# Patient Record
Sex: Female | Born: 1935
Health system: Southern US, Community
[De-identification: ages and names within clinical notes are randomized; demographics above are authoritative.]

## PROBLEM LIST (undated history)

## (undated) DIAGNOSIS — L409 Psoriasis, unspecified: Secondary | ICD-10-CM

## (undated) DIAGNOSIS — I639 Cerebral infarction, unspecified: Secondary | ICD-10-CM

## (undated) DIAGNOSIS — M810 Age-related osteoporosis without current pathological fracture: Secondary | ICD-10-CM

## (undated) DIAGNOSIS — R7303 Prediabetes: Secondary | ICD-10-CM

## (undated) DIAGNOSIS — I5030 Unspecified diastolic (congestive) heart failure: Secondary | ICD-10-CM

## (undated) DIAGNOSIS — R490 Dysphonia: Secondary | ICD-10-CM

## (undated) DIAGNOSIS — E785 Hyperlipidemia, unspecified: Secondary | ICD-10-CM

## (undated) DIAGNOSIS — N3281 Overactive bladder: Secondary | ICD-10-CM

## (undated) DIAGNOSIS — F4321 Adjustment disorder with depressed mood: Secondary | ICD-10-CM

## (undated) DIAGNOSIS — G500 Trigeminal neuralgia: Secondary | ICD-10-CM

## (undated) DIAGNOSIS — I10 Essential (primary) hypertension: Secondary | ICD-10-CM

## (undated) DIAGNOSIS — G250 Essential tremor: Secondary | ICD-10-CM

## (undated) DIAGNOSIS — G252 Other specified forms of tremor: Principal | ICD-10-CM

## (undated) DIAGNOSIS — Z7901 Long term (current) use of anticoagulants: Secondary | ICD-10-CM

## (undated) DIAGNOSIS — K5901 Slow transit constipation: Secondary | ICD-10-CM

## (undated) DIAGNOSIS — R1310 Dysphagia, unspecified: Secondary | ICD-10-CM

## (undated) DIAGNOSIS — D62 Acute posthemorrhagic anemia: Secondary | ICD-10-CM

## (undated) HISTORY — PX: HUMERUS FRACTURE SURGERY: SHX670

## (undated) HISTORY — DX: Trigeminal neuralgia: G50.0

## (undated) HISTORY — DX: Overactive bladder: N32.81

## (undated) HISTORY — DX: Psoriasis, unspecified: L40.9

## (undated) HISTORY — DX: Slow transit constipation: K59.01

## (undated) HISTORY — DX: Cerebral infarction, unspecified: I63.9

## (undated) HISTORY — DX: Other specified forms of tremor: G25.2

## (undated) HISTORY — DX: Long term (current) use of anticoagulants: Z79.01

## (undated) HISTORY — DX: Adjustment disorder with depressed mood: F43.21

## (undated) HISTORY — DX: Dysphonia: R49.0

## (undated) HISTORY — DX: Dysphagia, unspecified: R13.10

## (undated) HISTORY — DX: Acute posthemorrhagic anemia: D62

## (undated) HISTORY — DX: Essential (primary) hypertension: I10

## (undated) HISTORY — DX: Hyperlipidemia, unspecified: E78.5

## (undated) HISTORY — PX: CATARACT EXTRACTION: SUR2

## (undated) HISTORY — DX: Age-related osteoporosis without current pathological fracture: M81.0

## (undated) HISTORY — DX: Prediabetes: R73.03

## (undated) HISTORY — PX: OTHER SURGICAL HISTORY: SHX169

## (undated) HISTORY — DX: Unspecified diastolic (congestive) heart failure: I50.30

## (undated) HISTORY — DX: Essential tremor: G25.0

---
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
## 1958-05-25 HISTORY — PX: HEMORRHOID SURGERY: SHX153

## 1997-10-31 ENCOUNTER — Other Ambulatory Visit: Admission: RE | Admit: 1997-10-31 | Discharge: 1997-10-31 | Payer: Self-pay | Admitting: Obstetrics and Gynecology

## 1998-05-15 ENCOUNTER — Other Ambulatory Visit: Admission: RE | Admit: 1998-05-15 | Discharge: 1998-05-15 | Payer: Self-pay | Admitting: Gastroenterology

## 1999-03-03 ENCOUNTER — Other Ambulatory Visit: Admission: RE | Admit: 1999-03-03 | Discharge: 1999-03-03 | Payer: Self-pay | Admitting: Obstetrics and Gynecology

## 1999-03-28 ENCOUNTER — Encounter: Admission: RE | Admit: 1999-03-28 | Discharge: 1999-04-24 | Payer: Self-pay | Admitting: Specialist

## 1999-08-05 ENCOUNTER — Encounter: Admission: RE | Admit: 1999-08-05 | Discharge: 1999-09-18 | Payer: Self-pay | Admitting: Family Medicine

## 2000-01-05 ENCOUNTER — Other Ambulatory Visit: Admission: RE | Admit: 2000-01-05 | Discharge: 2000-01-05 | Payer: Self-pay | Admitting: Obstetrics and Gynecology

## 2000-11-12 ENCOUNTER — Encounter: Payer: Self-pay | Admitting: Family Medicine

## 2000-11-12 ENCOUNTER — Encounter: Admission: RE | Admit: 2000-11-12 | Discharge: 2000-11-12 | Payer: Self-pay | Admitting: Family Medicine

## 2001-02-24 ENCOUNTER — Other Ambulatory Visit: Admission: RE | Admit: 2001-02-24 | Discharge: 2001-02-24 | Payer: Self-pay | Admitting: Obstetrics and Gynecology

## 2003-01-23 ENCOUNTER — Other Ambulatory Visit: Admission: RE | Admit: 2003-01-23 | Discharge: 2003-01-23 | Payer: Self-pay | Admitting: Obstetrics and Gynecology

## 2003-05-26 HISTORY — PX: WRIST FRACTURE SURGERY: SHX121

## 2003-06-04 ENCOUNTER — Ambulatory Visit (HOSPITAL_BASED_OUTPATIENT_CLINIC_OR_DEPARTMENT_OTHER): Admission: RE | Admit: 2003-06-04 | Discharge: 2003-06-04 | Payer: Self-pay | Admitting: *Deleted

## 2004-05-25 HISTORY — PX: LAPAROSCOPIC HYSTERECTOMY: SHX1926

## 2004-08-26 ENCOUNTER — Observation Stay (HOSPITAL_COMMUNITY): Admission: RE | Admit: 2004-08-26 | Discharge: 2004-08-27 | Payer: Self-pay | Admitting: Obstetrics and Gynecology

## 2004-08-26 ENCOUNTER — Encounter (INDEPENDENT_AMBULATORY_CARE_PROVIDER_SITE_OTHER): Payer: Self-pay | Admitting: Specialist

## 2007-07-15 ENCOUNTER — Encounter: Admission: RE | Admit: 2007-07-15 | Discharge: 2007-07-15 | Payer: Self-pay | Admitting: Obstetrics and Gynecology

## 2007-07-27 ENCOUNTER — Ambulatory Visit: Payer: Self-pay | Admitting: Gastroenterology

## 2007-08-22 ENCOUNTER — Ambulatory Visit: Payer: Self-pay | Admitting: Gastroenterology

## 2007-08-22 ENCOUNTER — Encounter: Payer: Self-pay | Admitting: Gastroenterology

## 2008-07-10 ENCOUNTER — Encounter: Admission: RE | Admit: 2008-07-10 | Discharge: 2008-07-10 | Payer: Self-pay | Admitting: Obstetrics and Gynecology

## 2010-10-10 NOTE — Op Note (Signed)
Kelly [name], Kelly [name]              ACCOUNT NO.:  [id number]   MEDICAL RECORD NO.:  [id number]            PATIENT TYPE:   LOCATION:                                 FACILITY:   PHYSICIAN:  Juluis Mire, M.D.        DATE OF BIRTH:   DATE OF PROCEDURE:  08/26/2004  DATE OF DISCHARGE:                                 OPERATIVE REPORT   PREOPERATIVE DIAGNOSIS:  Symptomatic pelvic relaxation with associated  stress incontinence.   OPERATIVE PROCEDURE:  Laparoscopically assisted vaginal hysterectomy with  bilateral salpingo-oophorectomy.  Posterior colporrhaphy using the  PelviSoft.  Sacral spinous ligaments suspension.   SURGEON:  Juluis Mire, M.D.   ASSISTANT:  Freddy Finner, M.D.   ESTIMATED BLOOD LOSS:  Between 2 and 350 mL.   PACKS:  A vaginal pack.   DRAINS:  Urethral Foley.   There was no intraoperative blood replacement.  No complications were  encountered.   PROCEDURE IN DETAIL:  The patient was taken to the operating room and placed  in the supine position.  After satisfactory level of general endotracheal  anesthesia obtained, the patient was placed in the dorsal supine position  using the Allen stirrups.  The abdomen, perineum, vagina were prepped with  Betadine.  The bladder was emptied by catheterization.  The speculum was  placed in the vaginal vault.  The cervix was grasped with single-tooth  tenaculum.  A Hulka tenaculum put in place.  The single-tooth tenaculum was  and then removed.  The patient was then draped as a sterile field.  A  subumbilical incision made with the knife.  The incision was extended to the  fascia.  The fascia was entered sharply and the incision of the fascia  turned laterally.  The rectus muscles were separated, peritoneum was entered  bluntly with the finger.  The __________  open laparoscopic trocar was put  in place and secured.  The abdomen was inflated with carbon dioxide.  The  laparoscope was introduced.  There were some  omental adhesions in the right  lower quadrant probably from a previous appendectomy.  There was no evidence  of injury to adjacent organs.  A 5 mm trocar was put in place in the  suprapubic area.  The uterus was elevated.  The uterus was normal size and  sharp.  The tubes and ovaries were unremarkable.  Using the Gyrus bipolar,  we took down some of the omental adhesions that were right under the  umbilicus.  There was no bowel encountered in this.   Next, using the Gyrus bipolar, we first elevated the left ovary.  The ureter  was easily identified below.  Next, the ovarian vasculature was cauterized  and incised.  The mesenteric attachment to the ovary was then cauterized and  incised.  Then, the left round ligament was cauterized and incised.  We then  went to the right side.  The right ovary was elevated.  The ureter was  easily identified in the pelvic sidewall.  The right ovarian vasculature was  cauterized and incised.  The peritoneal  attachment of the right ovary was  cauterized and incised and the right round ligament was cauterized and  incised.  We had good hemostasis.   At this point in time, the abdomen was desufflated of carbon dioxide.  Laparoscope was removed.  The patient's leg was repositioned.  A weighted  speculum was placed in the vaginal vault.  Cervix was grasped with a Jacob's  tenaculum.  The caudal sac was entered sharply.  The uterosacral ligaments  were clamped, cut, and suture ligated with 0 Vicryl.  The reflection of the  vaginal mucosa anteriorly was incised and __________  was dissected  superiorly.  Paracervical tissue was then clamped, cut, and suture ligated  with 0 Vicryl.   Next, using the clamp, cut, and tie technique, the parametrium was serially  separated from the sides of the uterus.  We then brought a finger under the  uterus and identified the vesicouterine space and it was entered sharply,  retractors put in place.  The uterus was then  flipped, remaining pedicles  were clamped and cut.  Uterus, tubes, and ovaries were passed off the  operative field.  Pedicles were secured with free ties of 0 Vicryl.  We had  good hemostasis.  The vaginal mucosa was reapproximated in vertical fashion  with interrupted figure-of-eights of 0 Vicryl.   At this point, Dr. Annabell Howells came in and did an anterior suspension as well as  suburethral sling.  At this point in time, we did the posterior  colporrhaphy.  A V incision was made over the perineal body using a knife  and the skin was dissected superiorly and excised.  Next, the vaginal mucosa  was undermined.  We continued the dissection up to the vaginal apex.  We  made a midline incision in the vaginal mucosa.  We then dissected down to  both uterosacral ligaments which were easily identified.  The using the  Capio needle passer, we put a total of two Vicryl sutures in each  uterosacral ligament on both sides.  These were as close to the sacrum as  possible.  There was no active bleeding.   Next we brought the PelviSoft in place.  It was secured to two of the  sutures that were through the uterosacral ligament and secured back to the  uterosacral ligament.  We had good placement.  Next, using the Mayo needle,  the remaining free sutures were brought through the vaginal apex mucosa in a  helical fashion.  Both were then secured down giving [place] a good suspension of  the vaginal vault completely back to both uterosacral ligaments.  At this  point in time, we trimmed the remaining PelviSoft and secured it to perineal  body with interrupted sutures of 2-0 Vicryl.  Next, the vaginal mucosa was  closed with a running, locking suture of 2-0 Vicryl.  Interrupted sutures of  2-0 Vicryl were used to rebuild the perineal body and the skin over the  perineum was closed with interrupted stitches of 2-0 Vicryl.  Urine output  remained clear and adequate at this point.  At this point in time, the abdomen  was re-inflated with carbon dioxide, the  laparoscope was re-introduced.  Some bleeding from the left side of the  vaginal cuff was well controlled using the Gyrus.  At this point in time, we  had good hemostasis.  The abdomen was desufflated with carbon dioxide.  The  laparoscope and suprapubic trocar was removed.  Subumbilical fascia was  closed  with interrupted figure-of-eights of 2-0 Vicryl.  The skin was closed  with interrupted subcuticulars of 4-0 Vicryl.  The suprapubic incision was  closed with Dermabond.  Next, a vaginal packing was put in place.  Urine  output remained clear and adequate.  The patient was taken out of the dorsal  lithotomy position, extubated, transferred to the recovery room in good  condition.  Sponge, instrument, needle, count reported as correct by  circulating nurse x2.      JSM/MEDQ  D:  08/26/2004  T:  08/26/2004  Job:  [id number]

## 2010-10-10 NOTE — Discharge Summary (Signed)
Kelly [name], MINOR              ACCOUNT NO.:  [id number]   MEDICAL RECORD NO.:  [id number]          PATIENT TYPE:  OBV   LOCATION:  9315                          FACILITY:  [facility]   PHYSICIAN:  Juluis Mire, M.D.   DATE OF BIRTH:  1927-08-13   DATE OF ADMISSION:  08/26/2004  DATE OF DISCHARGE:                                 DISCHARGE SUMMARY   ADMITTING DIAGNOSIS:  Symptomatic pelvic relaxation with stress urinary  incontinence.   DISCHARGE DIAGNOSIS:  Symptomatic pelvic relaxation with stress urinary  incontinence.   PROCEDURE:  1.  SPARC sling with Perigee anterior suspension.  2.  Laparoscopic-assisted vaginal hysterectomy with bilateral salpingo-      oophorectomy.  3.  Posterior colporrhaphy.  4.  Sacrospinous ligament suspension.   For complete History and Physical please see dictated note.   COURSE IN THE HOSPITAL:  The patient underwent the above-noted surgery.  Pathology is indeed still pending. She did excellent postoperatively. On her  postoperative day #1 she was tolerating a regular diet and ambulating  without difficulty. Foley had been discontinued. She was voiding without  difficulty, had no active vaginal bleeding, and all incisions appeared to be  intact. She will be discharged home at this time. Her postoperative  hemoglobin was 10.8.   COMPLICATIONS:  None encountered during her stay in the hospital. The  patient is returning home in stable condition.   DISPOSITION:  The patient to avoid heavy lifting, vaginal entrance, or  driving a car. She is to call with increasing fever, nausea, vomiting,  increasing abdominal pain, or active vaginal bleeding. Discharged home with  Percocet as she needs for pain. Follow up in the office in 1 week.      JSM/MEDQ  D:  08/27/2004  T:  08/27/2004  Job:  [id number]

## 2010-10-10 NOTE — Op Note (Signed)
NAME:  Kelly [name], Kelly [name]                        ACCOUNT NO.:  [id number]   MEDICAL RECORD NO.:  [id number]                   PATIENT TYPE:  AMB   LOCATION:  DSC                                  FACILITY:  [facility]   PHYSICIAN:  Lowell Bouton, M.D.      DATE OF BIRTH:  1928/01/29   DATE OF PROCEDURE:  06/04/2003  DATE OF DISCHARGE:                                 OPERATIVE REPORT   PREOPERATIVE DIAGNOSIS:  Fracture of left distal radius and ulnar styloid.   POSTOPERATIVE DIAGNOSIS:  Fracture of left distal radius and ulnar styloid.   OPERATION PERFORMED:  Open reduction internal fixation, left distal radius  fracture.   SURGEON:  Lowell Bouton, M.D.   ANESTHESIA:  Axillary block augmented with general.   OPERATIVE FINDINGS:  The patient had a comminuted fracture that was just  proximal to the joint.  It did not have an intra-articular component.  The  fracture was three weeks old and there was a lot of callus formation.  It  was shortened and radially deviated.   DESCRIPTION OF PROCEDURE:  Under axillary block, the left wrist was prepped  and draped in the usual fashion and after exsanguinating the limb, the  tourniquet was inflated to 250 mmHg.  The hand was suspended from the finger  traps in the index and middle fingers over the end of the table with 10  pounds of traction.  Longitudinal incision was made over the volar aspect of  the left distal forearm.  Sharp dissection was carried down to the FCR  tendon.  At this point the patient experienced pain and so she was put to  sleep.  The FCR sheath was opened longitudinally and the tendon was  retracted radially.  The floor of the FCR sheath was then incised and blunt  dissection was carried down to the FPL tendon.  This was retracted ulnarly  and a Therapist, nutritional was used to free it from the radius.  A knife was used  to incise the pronator quadratus and reflect it ulnarly.  The fracture site  was then  identified and had to be taken down and a callus that had formed.  A rongeur was used along with a curet and the periosteum was elevated with a  Therapist, nutritional.  The fracture was then reduced and brought out to length.  The DVR plate was then applied using a left-sided plate.  The 2.5 mm screw  was placed in a slotted hole initially and x-rays were obtained showing the  plate was slightly distal.  The plate was then moved more proximal.  Two  pegs were then inserted distally and x-rays showed good alignment of the  fracture.  The traction was released.  The remaining screws and pegs were  inserted in the plate and x-ray showed good position and alignment.  Under  fluoroscopy there was no motion at the fracture site.  The wounds were then  irrigated copiously  with saline.  Fluoroscopic views showed that the screws  did not penetrate the articular surface.  The wound was closed with 4-0  Vicryl in the pronator quadratus.  TLS drain was inserted for drainage, the  subcutaneous tissue was closed with 4-0 Vicryl and the skin with 3-0  subcuticular Prolene.  Steri-Strips were applied followed by sterile  dressings and a volar wrist splint.  The patient tolerated the procedure  well and went to the recovery room awake and stable in  good condition.                                               Lowell Bouton, M.D.    EMM/MEDQ  D:  06/04/2003  T:  06/04/2003  Job:  [id number]

## 2010-10-10 NOTE — Op Note (Signed)
Kelly [name], Kelly [name]              ACCOUNT NO.:  [id number]   MEDICAL RECORD NO.:  [id number]          PATIENT TYPE:  OBV   LOCATION:  9399                          FACILITY:  [facility]   PHYSICIAN:  Excell Seltzer. Annabell Howells, M.D.    DATE OF BIRTH:  April 28, 1928   DATE OF PROCEDURE:  08/26/2004  DATE OF DISCHARGE:                                 OPERATIVE REPORT   PROCEDURE:  SPARC sling and placement of Perigee anterior suspension kit.   SURGEON:  Excell Seltzer. Annabell Howells, M.D.   ASSISTANT:  Juluis Mire, M.D.   ANESTHESIA:  General anesthesia.   DRAINS:  Foley catheter.   COMPLICATIONS:  None.   INDICATIONS FOR PROCEDURE:  Kelly [name] is a 77 year old white female with  vault prolapse and incontinence who is to undergo a laparoscopically  assisted vaginal hysterectomy  and anterior vault suspension SPARC sling and  posterior repair.   FINDINGS:  The patient was taken to the operating room where general  anesthetic was induced.  Antibiotics were given per Dr. Arelia Sneddon.  He  performed an LAVH and then closed the vaginal cuff.  I then entered the  procedure for my portion.   The Foley catheter was inserted.  The bladder was drained.  The anterior  vaginal wall was infiltrated with about 5 mL of 1% lidocaine with  epinephrine.  Anterior midline vaginal wall incision was made over the  predominant bulge of the cystocele well away from the bladder  neck.  The  vaginal mucosa was then elevated off the underlying bladder wall proximally  to the cuff, laterally to the side walls and distally to approximately the  bladder neck  level.  Once the dissection had been completed, sponge was  temporarily packed in the vaginal vault.  Incisions were then made 5 cm  lateral to the clitoris on each side just below the adductor longus tendon.  Additional incisions were made 3 cm posterior and 2 cm lateral to the  initial incisions.  The left distal needle was then passed through the  obturator foramen without difficulty  until the tip could be palpated within  the vaginal vault at the level of the bladder neck.  This was then repeated  on the right side.  The wings of the Perigee mesh were then snapped to the  appropriate trocars and pulled back through partially.  The mesh was then  flipped onto the abdominal wall and the posterior needles were then passed.  First on the right, then on the left, with a finger in the vaginal incision  adjacent to the ischial spine protecting the bladder.  The needle tip was  brought through just inferior and caudal to the ischial spine.  This  procedure was then repeated on the right side.  Once both of the posterior  trocars had been passed, the posterior wings of the Perigee system were  secured to the trocars and pulled back into position.  The mesh was then  secured at the bladder neck level with two 2-0 Vicryl stitches.  The  proximal end was trimmed appropriately and secured at the  level of the cuff  with two 2-0 Vicryl sutures.  The wings were then pulled into their  appropriate position suspending the bladder.  At this point, the anterior  vaginal wall mucosa was closed with a running locked 2-0 Vicryl.  The wings  were tensioned and the sheaths were removed from the mesh wings.  The long  ends of the wings were then trimmed allowing the ends to drop back into the  subcutaneous space.  At this point, the second anterior vaginal wall  incision was made over the mid urethral level approximately 2 cm in length.  The mucosa was elevated off the pubourethral fascia on each side for  approximately 1 to 2 cm allowing placement of fingertip in the incision.  Two incisions were then made at the level of the superior border of the  pubis each approximately 2 cm lateral to midline.  The trocars for the [facility]  kit were then passed.  First the right trocar was passed, the tip was  brought down to the top of the pubis then walked along the back of the bone,  then brought out  through the vaginal incision under fingertip guide and with  great care being taken to avoid the urethra.  This was then repeated on the  left.  At this point, the Foley was removed.  The cystoscope was placed and  examination of the bladder and bladder neck with the 70 degree lens revealed  no needle passage through the bladder and no obvious injury associated with  the Perigee mesh.  The cystoscope was removed after draining the bladder.  The [facility] mesh was secured to the trocars and pulled into position.  Repeat  cystoscopy once again revealed no injury.  At this point the Foley catheter  was reinserted into the bladder.  The balloon was filled with 10 mL of  sterile fluid and the bladder was drained.  The mesh was then appropriately  tensioned.  The sleeves were removed after irrigating with antibiotic  solution which was done for the Perigee wings as well. Once in proper  position with the ability to place a right angle clamp between the mesh and  the urethra indicating appropriate tension, the anterior vaginal wall  incision was closed using a running 2-0 Vicryl suture.  At this point, the  long ends of the mesh were trimmed on the abdominal wall allowing the trips  to drop back into the subcutaneous space.  All of the small incisions were  then cleaned, dried and closed with Dermabond.  At this point, Dr. Arelia Sneddon  returned and will perform a posterior repair and sacrospinous ligament vault  suspension.  There were no complications during my portion of the procedure.      JJW/MEDQ  D:  08/26/2004  T:  08/26/2004  Job:  [id number]

## 2010-10-10 NOTE — H&P (Signed)
Kelly [name], PAREJA NO.:  [id number]   MEDICAL RECORD NO.:  [id number]          PATIENT TYPE:  AMB   LOCATION:  SDC                           FACILITY:  [facility]   PHYSICIAN:  Juluis Mire, M.D.   DATE OF BIRTH:  [birth year]   DATE OF ADMISSION:  DATE OF DISCHARGE:                                HISTORY & PHYSICAL   The patient is a 77 year old postmenopausal female who presents for surgical  management of symptomatic pelvic relaxation.   The patient has had a history of pelvic relaxation in the form of moderate  cystocele and associated mild rectocele.  She also has moderate uterine  descensus.  This is becoming disruptive in terms of activity due to pressure  and discomfort.  She has also had increasing stress incontinence as well as  bowel difficulties.  She has been seen by Dr. Annabell Howells for this.  Dr. Annabell Howells is  planning to proceed with surgical management of the incontinence.  We have  evaluated her with an ultrasound on February 27 of this year.  She had a  small fibroid.  Adnexa was unremarkable.  In view of increasing  symptomatology, the patient now will proceed with laparoscopically-assisted  vaginal hysterectomy, possible bilateral salpingo-oophorectomy, anterior and  posterior colporrhaphy, and Dr. Annabell Howells will perform some type of urethral  sling.   In terms of allergies, no known drug allergies.   Medications include Fosamax.   PAST MEDICAL HISTORY:  Usual childhood diseases without any significant  sequelae.   PAST SURGICAL HISTORY:  She has had cataract surgery.  She has had her  appendectomy and a hemorrhoidectomy.   PAST OBSTETRICAL HISTORY:  She has had three vaginal deliveries.   FAMILY HISTORY:  Noncontributory.   SOCIAL HISTORY:  There is no tobacco or alcohol use.   REVIEW OF SYSTEMS:  Noncontributory.   PHYSICAL EXAMINATION:  GENERAL:  The patient is afebrile with stable vital  signs.  HEENT:  The patient is normocephalic.  Pupils  equal, round, and reactive to  light and accommodation.  Extraocular movements were intact.  Sclerae and  conjunctivae clear.  Oropharynx clear.  NECK:  Without thyromegaly.  BREASTS:  No discrete masses.  CHEST:  Lungs clear.  CARDIAC:  Regular rhythm and rate without murmurs or gallops.  ABDOMEN:  Benign.  No masses, organomegaly or tenderness.  PELVIC:  Normal external genitalia.  Vaginal mucosa is clear.  Cervix is  unremarkable.  There is a severe cystocele with moderate uterine descensus  and moderate rectocele.  It is hard to determine the amount of cuff support  at the present time.  Bimanual exam:  Uterus normal size and shape, adnexa  unremarkable.  Rectovaginal exam is clear.   IMPRESSION:  Worsening pelvic relaxation with associated symptomatology.   PLAN:  The patient will undergo a laparoscopically-assisted vaginal  hysterectomy and possible bilateral salpingo-oophorectomy.  Will proceed  with A&P repair.  Possible vaginal vault suspension.  Dr. Annabell Howells will be  doing the urological part of the procedure.  The risks of surgery have been  discussed, including the risk of infection; the  risk of hemorrhage that  could require transfusion and the risk of AIDS or hepatitis; the risk of  injury to adjacent organs requiring further exploratory surgery; the risk of  deep venous thrombosis and pulmonary embolus.  The recurrent risk for pelvic  relaxation has also been discussed.  The patient expressed an understanding  of the indications and risks.      JSM/MEDQ  D:  08/26/2004  T:  08/26/2004  Job:  [id number]

## 2011-10-22 DIAGNOSIS — M810 Age-related osteoporosis without current pathological fracture: Secondary | ICD-10-CM | POA: Diagnosis not present

## 2011-10-22 DIAGNOSIS — Z79899 Other long term (current) drug therapy: Secondary | ICD-10-CM | POA: Diagnosis not present

## 2011-10-22 DIAGNOSIS — R82998 Other abnormal findings in urine: Secondary | ICD-10-CM | POA: Diagnosis not present

## 2011-10-29 DIAGNOSIS — I4460 Unspecified fascicular block: Secondary | ICD-10-CM | POA: Diagnosis not present

## 2011-10-29 DIAGNOSIS — G252 Other specified forms of tremor: Secondary | ICD-10-CM | POA: Diagnosis not present

## 2011-10-29 DIAGNOSIS — Z79899 Other long term (current) drug therapy: Secondary | ICD-10-CM | POA: Diagnosis not present

## 2011-10-29 DIAGNOSIS — G250 Essential tremor: Secondary | ICD-10-CM | POA: Diagnosis not present

## 2011-10-29 DIAGNOSIS — Z0000 Encounter for general adult medical examination without abnormal findings: Secondary | ICD-10-CM | POA: Diagnosis not present

## 2011-10-29 DIAGNOSIS — E559 Vitamin D deficiency, unspecified: Secondary | ICD-10-CM | POA: Diagnosis not present

## 2011-11-09 ENCOUNTER — Other Ambulatory Visit: Payer: Self-pay | Admitting: Obstetrics and Gynecology

## 2011-11-09 ENCOUNTER — Other Ambulatory Visit: Payer: Self-pay | Admitting: Internal Medicine

## 2011-11-09 DIAGNOSIS — Z1231 Encounter for screening mammogram for malignant neoplasm of breast: Secondary | ICD-10-CM

## 2011-11-23 ENCOUNTER — Ambulatory Visit
Admission: RE | Admit: 2011-11-23 | Discharge: 2011-11-23 | Disposition: A | Discharge status: Home / Self Care | Payer: Medicare Other | Source: Ambulatory Visit | Attending: Internal Medicine | Admitting: Internal Medicine

## 2011-11-23 DIAGNOSIS — Z1231 Encounter for screening mammogram for malignant neoplasm of breast: Secondary | ICD-10-CM

## 2011-12-01 DIAGNOSIS — M810 Age-related osteoporosis without current pathological fracture: Secondary | ICD-10-CM | POA: Diagnosis not present

## 2012-02-23 DIAGNOSIS — H26499 Other secondary cataract, unspecified eye: Secondary | ICD-10-CM | POA: Diagnosis not present

## 2012-03-22 DIAGNOSIS — Z23 Encounter for immunization: Secondary | ICD-10-CM | POA: Diagnosis not present

## 2012-08-02 DIAGNOSIS — H01009 Unspecified blepharitis unspecified eye, unspecified eyelid: Secondary | ICD-10-CM | POA: Diagnosis not present

## 2012-08-02 DIAGNOSIS — H04129 Dry eye syndrome of unspecified lacrimal gland: Secondary | ICD-10-CM | POA: Diagnosis not present

## 2012-08-02 DIAGNOSIS — H3530 Unspecified macular degeneration: Secondary | ICD-10-CM | POA: Diagnosis not present

## 2012-08-02 DIAGNOSIS — H26499 Other secondary cataract, unspecified eye: Secondary | ICD-10-CM | POA: Diagnosis not present

## 2012-09-06 DIAGNOSIS — H04129 Dry eye syndrome of unspecified lacrimal gland: Secondary | ICD-10-CM | POA: Diagnosis not present

## 2012-09-06 DIAGNOSIS — H3530 Unspecified macular degeneration: Secondary | ICD-10-CM | POA: Diagnosis not present

## 2012-10-31 DIAGNOSIS — M810 Age-related osteoporosis without current pathological fracture: Secondary | ICD-10-CM | POA: Diagnosis not present

## 2012-10-31 DIAGNOSIS — Z79899 Other long term (current) drug therapy: Secondary | ICD-10-CM | POA: Diagnosis not present

## 2012-11-07 DIAGNOSIS — K219 Gastro-esophageal reflux disease without esophagitis: Secondary | ICD-10-CM | POA: Diagnosis not present

## 2012-11-07 DIAGNOSIS — E559 Vitamin D deficiency, unspecified: Secondary | ICD-10-CM | POA: Diagnosis not present

## 2012-11-07 DIAGNOSIS — M810 Age-related osteoporosis without current pathological fracture: Secondary | ICD-10-CM | POA: Diagnosis not present

## 2012-11-07 DIAGNOSIS — G250 Essential tremor: Secondary | ICD-10-CM | POA: Diagnosis not present

## 2012-11-07 DIAGNOSIS — L408 Other psoriasis: Secondary | ICD-10-CM | POA: Diagnosis not present

## 2012-11-07 DIAGNOSIS — IMO0002 Reserved for concepts with insufficient information to code with codable children: Secondary | ICD-10-CM | POA: Diagnosis not present

## 2012-11-07 DIAGNOSIS — I4460 Unspecified fascicular block: Secondary | ICD-10-CM | POA: Diagnosis not present

## 2012-11-07 DIAGNOSIS — Z0000 Encounter for general adult medical examination without abnormal findings: Secondary | ICD-10-CM | POA: Diagnosis not present

## 2013-01-11 ENCOUNTER — Other Ambulatory Visit: Payer: Self-pay

## 2013-01-11 DIAGNOSIS — Z9882 Breast implant status: Secondary | ICD-10-CM

## 2013-01-11 DIAGNOSIS — Z1231 Encounter for screening mammogram for malignant neoplasm of breast: Secondary | ICD-10-CM

## 2013-01-19 DIAGNOSIS — G500 Trigeminal neuralgia: Secondary | ICD-10-CM | POA: Diagnosis not present

## 2013-01-19 DIAGNOSIS — K219 Gastro-esophageal reflux disease without esophagitis: Secondary | ICD-10-CM | POA: Diagnosis not present

## 2013-01-19 DIAGNOSIS — G250 Essential tremor: Secondary | ICD-10-CM | POA: Diagnosis not present

## 2013-02-06 ENCOUNTER — Ambulatory Visit
Admission: RE | Admit: 2013-02-06 | Discharge: 2013-02-06 | Disposition: A | Discharge status: Home / Self Care | Payer: Medicare Other | Source: Ambulatory Visit

## 2013-02-06 DIAGNOSIS — Z9882 Breast implant status: Secondary | ICD-10-CM

## 2013-02-06 DIAGNOSIS — Z1231 Encounter for screening mammogram for malignant neoplasm of breast: Secondary | ICD-10-CM | POA: Diagnosis not present

## 2013-02-16 DIAGNOSIS — Z23 Encounter for immunization: Secondary | ICD-10-CM | POA: Diagnosis not present

## 2013-02-17 ENCOUNTER — Encounter: Payer: Self-pay | Admitting: Neurology

## 2013-02-20 ENCOUNTER — Encounter: Payer: Self-pay | Admitting: Neurology

## 2013-02-20 ENCOUNTER — Ambulatory Visit (INDEPENDENT_AMBULATORY_CARE_PROVIDER_SITE_OTHER): Payer: Medicare Other | Admitting: Neurology

## 2013-02-20 VITALS — BP 130/79 | HR 70 | Ht 60.0 in | Wt 125.0 lb

## 2013-02-20 DIAGNOSIS — G500 Trigeminal neuralgia: Secondary | ICD-10-CM | POA: Diagnosis not present

## 2013-02-20 DIAGNOSIS — R490 Dysphonia: Secondary | ICD-10-CM | POA: Diagnosis not present

## 2013-02-20 DIAGNOSIS — G250 Essential tremor: Secondary | ICD-10-CM | POA: Diagnosis not present

## 2013-02-20 DIAGNOSIS — R6889 Other general symptoms and signs: Secondary | ICD-10-CM

## 2013-02-20 DIAGNOSIS — G252 Other specified forms of tremor: Secondary | ICD-10-CM

## 2013-02-20 HISTORY — DX: Trigeminal neuralgia: G50.0

## 2013-02-20 HISTORY — DX: Other specified forms of tremor: G25.0

## 2013-02-20 HISTORY — DX: Essential tremor: G25.2

## 2013-02-20 HISTORY — DX: Dysphonia: R49.0

## 2013-02-20 MED ORDER — ALPRAZOLAM 0.25 MG PO TABS: 0.2500 mg | ORAL_TABLET | Freq: Three times a day (TID) | ORAL | Status: DC | PRN

## 2013-02-20 NOTE — Progress Notes (Signed)
Reason for visit: Tremor  Kelly [name] is a 79 y.o. female  History of present illness:  Kelly [name] is an 79 year old right-handed white female with a history of an essential tremor, seen previously through this office in 2010. The patient was placed on Topamax for her tremor. The patient recently went off of this medication as she felt that this was not helping. The patient had noted onset of trigeminal neuralgia involving the left V2 distribution. The patient was then placed on low-dose gabapentin taking 100 mg twice daily, with good resolution of the left trigeminal neuralgia pain. The patient indicates that 2 or 3 years ago, she began noting some problems with hoarseness of voice. The patient was seen by Dr. Ezzard Standing, and it was felt that she had reflux issues causing her hoarseness. The patient indicates that no vocal cord nodules or paralysis of the vocal cords were noted. The patient was placed on Nexium, but once again, she gained no benefit with the hoarseness. The patient denies any definite vocal tremors, but the hoarseness has gradually worsened over time. The patient denies any dysphagia. The patient reports no focal numbness or weakness of the face, arms, or legs. The patient denies any visual disturbance, or problems controlling the bowels or the bladder. The patient indicates that she has difficulty with handwriting and feeding himself secondary to the tremor, but she is able to perform all activities of daily living. The patient indicates that her son also has developed an essential tremor.  Past Medical History  Diagnosis Date  . Osteoporosis   . Essential tremor   . Psoriasis   . Essential and other specified forms of tremor 02/20/2013  . Dysphonia 02/20/2013    Past Surgical History  Procedure Laterality Date  . Wrist fracture surgery  2005    seconday shoulder 2001  . Humerus fracture surgery    . Torn rotator cuff    . Hemorrhoid surgery  1960  . Appendectomy  1940  .  Laparoscopic hysterectomy  2006  . Bladder tack      Family History  Problem Relation Age of Onset  . Arthritis Brother     spine  . Osteoporosis Maternal Grandmother     Social history:  reports that she has never smoked. She has never used smokeless tobacco. She reports that  drinks alcohol. She reports that she does not use illicit drugs.  Medications:  Current Outpatient Prescriptions on File Prior to Visit  Medication Sig Dispense Refill  . gabapentin (NEURONTIN) 100 MG capsule Take 100 mg by mouth 2 (two) times daily.       No current facility-administered medications on file prior to visit.     No Known Allergies  ROS:  Out of a complete 14 system review of symptoms, the patient complains only of the following symptoms, and all other reviewed systems are negative.  Hearing loss Easy bruising Slurred speech Tremor Decreased energy Restless legs Hoarseness  Blood pressure 130/79, pulse 70, height 5' (1.524 m), weight 125 lb (56.7 kg).  Physical Exam  General: The patient is alert and cooperative at the time of the examination.  Head: Pupils are equal, round, and reactive to light. Discs are flat bilaterally.  Neck: The neck is supple, no carotid bruits are noted.  Respiratory: The respiratory examination is clear.  Cardiovascular: The cardiovascular examination reveals a regular rate and rhythm, no obvious murmurs or rubs are noted.  Skin: Extremities are without significant edema.  Neurologic Exam  Mental  status:  Cranial nerves: Facial symmetry is present. There is good sensation of the face to pinprick and soft touch bilaterally. The strength of the facial muscles and the muscles to head turning and shoulder shrug are normal bilaterally. Speech is slightly hoarse, no aphasia is noted. Extraocular movements are full. Visual fields are full.  Motor: The motor testing reveals 5 over 5 strength of all 4 extremities. Good symmetric motor tone is noted  throughout.  Sensory: Sensory testing is intact to pinprick, soft touch, vibration sensation, and position sense on all 4 extremities. No evidence of extinction is noted.  Coordination: Cerebellar testing reveals good finger-nose-finger and heel-to-shin bilaterally. Intention tremors are noted with finger-nose-finger bilaterally.  Gait and station: Gait is normal. The patient has good arm swing with walking. Tandem gait is unsteady. Romberg is negative. No drift is seen.  Reflexes: Deep tendon reflexes are symmetric, but are depressed bilaterally. Toes are downgoing bilaterally.   Assessment/Plan:  1. Dysphonia  2. Benign essential tremor   3. Left trigeminal neuralgia  The patient believes that the dysphonia has gradually worsened over time. Another consultation with Dr. Ezzard Standing may be reasonable at this point, as most causes of dysphonia are related to abnormalities of the vocal cords. The patient does not appear to have a true vocal tremor that could be associated with the benign essential tremor. The patient has had good resolution of the left trigeminal neuralgia with a low dose of gabapentin. Gabapentin may also improve tremor, but the patient does not wish to go up on the medication. The patient will be given a low dose of alprazolam to take if needed for the tremor. The patient will undergo a CT of the brain, and some further blood work. The patient will followup if needed.  Marlan Palau MD 02/20/2013 10:14 AM  [facility] [address] [place], [place] [address]  Phone [phone] Fax [phone]

## 2013-02-25 LAB — ANA W/REFLEX: Anti Nuclear Antibody(ANA): NEGATIVE

## 2013-02-25 LAB — ACETYLCHOLINE RECEPTOR, BINDING: AChR Binding Ab, Serum: <0.03 nmol/L (ref 0.00–0.24)

## 2013-02-25 LAB — SEDIMENTATION RATE: Sed Rate: 4 mm/h (ref 0–40)

## 2013-02-25 LAB — RHEUMATOID FACTOR: Rhuematoid fact SerPl-aCnc: 10.8 IU/mL (ref 0.0–13.9)

## 2013-02-25 LAB — TSH: TSH: 0.704 u[IU]/mL (ref 0.450–4.500)

## 2013-02-27 NOTE — Progress Notes (Signed)
Quick Note:  I called pt and relayed the lab results to her with verbal understanding. She stated that she had a 15-20 min episode of dizziness on getting out of bed, lying in bed ok, just getting up. No n/v. She stated this episode worse then other transient times since starting the gabapentin. Dr. Wylene Simmer ordered the gabapentin for tremors. Are you following this? Please advise. ______

## 2013-03-06 NOTE — Progress Notes (Signed)
Quick Note:  I called and LMVM for pt that Dr. Wylene Simmer prescribed the gabapentin which is a low dose. If still having problems and think it is gabapentin to call His office. Dr. Anne Hahn ordered CT and other labs and when has done will callll with results or if questions to call us back. ______

## 2013-03-07 ENCOUNTER — Ambulatory Visit
Admission: RE | Admit: 2013-03-07 | Discharge: 2013-03-07 | Disposition: A | Discharge status: Home / Self Care | Payer: Medicare Other | Source: Ambulatory Visit | Attending: Neurology | Admitting: Neurology

## 2013-03-07 DIAGNOSIS — G250 Essential tremor: Secondary | ICD-10-CM

## 2013-03-07 DIAGNOSIS — R6889 Other general symptoms and signs: Secondary | ICD-10-CM

## 2013-03-07 DIAGNOSIS — G500 Trigeminal neuralgia: Secondary | ICD-10-CM

## 2013-03-07 DIAGNOSIS — R490 Dysphonia: Secondary | ICD-10-CM

## 2013-03-07 DIAGNOSIS — R279 Unspecified lack of coordination: Secondary | ICD-10-CM

## 2013-03-10 ENCOUNTER — Telehealth: Payer: Self-pay | Admitting: Neurology

## 2013-03-10 ENCOUNTER — Telehealth: Payer: Self-pay | Admitting: *Deleted

## 2013-03-10 NOTE — Telephone Encounter (Signed)
Ct shows mild age related shrinkage of brain and hardening of arteries. No worrisome findings.

## 2013-03-10 NOTE — Telephone Encounter (Signed)
Pt called and is asking about CT results.   I called and spoke to [place] at [facility] and it was done 03-07-13.  Will check with Dr. Pearlean Brownie about report this afternoon.

## 2013-03-10 NOTE — Telephone Encounter (Signed)
I called Kelly [name] and relayed this information below concerning her MRI.  Kelly [name] was also told that Dr. Anne Hahn may call himself, but I relayed after speaking with Dr. Pearlean Brownie verbally. She verbalized understanding.

## 2013-03-10 NOTE — Telephone Encounter (Signed)
I called patient. The CT the head was unremarkable.

## 2013-04-14 DIAGNOSIS — M25559 Pain in unspecified hip: Secondary | ICD-10-CM | POA: Diagnosis not present

## 2013-04-14 DIAGNOSIS — M545 Low back pain, unspecified: Secondary | ICD-10-CM | POA: Diagnosis not present

## 2013-11-07 DIAGNOSIS — Z79899 Other long term (current) drug therapy: Secondary | ICD-10-CM | POA: Diagnosis not present

## 2013-11-07 DIAGNOSIS — Z0000 Encounter for general adult medical examination without abnormal findings: Secondary | ICD-10-CM | POA: Diagnosis not present

## 2013-11-07 DIAGNOSIS — R82998 Other abnormal findings in urine: Secondary | ICD-10-CM | POA: Diagnosis not present

## 2013-11-07 DIAGNOSIS — M810 Age-related osteoporosis without current pathological fracture: Secondary | ICD-10-CM | POA: Diagnosis not present

## 2013-11-21 DIAGNOSIS — I4460 Unspecified fascicular block: Secondary | ICD-10-CM | POA: Diagnosis not present

## 2013-11-21 DIAGNOSIS — K219 Gastro-esophageal reflux disease without esophagitis: Secondary | ICD-10-CM | POA: Diagnosis not present

## 2013-11-21 DIAGNOSIS — G500 Trigeminal neuralgia: Secondary | ICD-10-CM | POA: Diagnosis not present

## 2013-11-21 DIAGNOSIS — E559 Vitamin D deficiency, unspecified: Secondary | ICD-10-CM | POA: Diagnosis not present

## 2013-11-21 DIAGNOSIS — L408 Other psoriasis: Secondary | ICD-10-CM | POA: Diagnosis not present

## 2013-11-21 DIAGNOSIS — M810 Age-related osteoporosis without current pathological fracture: Secondary | ICD-10-CM | POA: Diagnosis not present

## 2013-11-21 DIAGNOSIS — G250 Essential tremor: Secondary | ICD-10-CM | POA: Diagnosis not present

## 2013-11-21 DIAGNOSIS — Z1331 Encounter for screening for depression: Secondary | ICD-10-CM | POA: Diagnosis not present

## 2013-11-21 DIAGNOSIS — Z0000 Encounter for general adult medical examination without abnormal findings: Secondary | ICD-10-CM | POA: Diagnosis not present

## 2013-12-05 DIAGNOSIS — M810 Age-related osteoporosis without current pathological fracture: Secondary | ICD-10-CM | POA: Diagnosis not present

## 2014-02-28 DIAGNOSIS — Z23 Encounter for immunization: Secondary | ICD-10-CM | POA: Diagnosis not present

## 2014-05-03 DIAGNOSIS — Z23 Encounter for immunization: Secondary | ICD-10-CM | POA: Diagnosis not present

## 2014-07-30 DIAGNOSIS — R42 Dizziness and giddiness: Secondary | ICD-10-CM | POA: Diagnosis not present

## 2014-07-30 DIAGNOSIS — G500 Trigeminal neuralgia: Secondary | ICD-10-CM | POA: Diagnosis not present

## 2014-07-30 DIAGNOSIS — L409 Psoriasis, unspecified: Secondary | ICD-10-CM | POA: Diagnosis not present

## 2014-07-30 DIAGNOSIS — Z6824 Body mass index (BMI) 24.0-24.9, adult: Secondary | ICD-10-CM | POA: Diagnosis not present

## 2014-11-27 DIAGNOSIS — Z0000 Encounter for general adult medical examination without abnormal findings: Secondary | ICD-10-CM | POA: Diagnosis not present

## 2014-11-27 DIAGNOSIS — M810 Age-related osteoporosis without current pathological fracture: Secondary | ICD-10-CM | POA: Diagnosis not present

## 2014-12-04 DIAGNOSIS — I4460 Unspecified fascicular block: Secondary | ICD-10-CM | POA: Diagnosis not present

## 2014-12-04 DIAGNOSIS — G500 Trigeminal neuralgia: Secondary | ICD-10-CM | POA: Diagnosis not present

## 2014-12-04 DIAGNOSIS — Z0000 Encounter for general adult medical examination without abnormal findings: Secondary | ICD-10-CM | POA: Diagnosis not present

## 2014-12-04 DIAGNOSIS — K219 Gastro-esophageal reflux disease without esophagitis: Secondary | ICD-10-CM | POA: Diagnosis not present

## 2014-12-04 DIAGNOSIS — L409 Psoriasis, unspecified: Secondary | ICD-10-CM | POA: Diagnosis not present

## 2014-12-04 DIAGNOSIS — E559 Vitamin D deficiency, unspecified: Secondary | ICD-10-CM | POA: Diagnosis not present

## 2014-12-04 DIAGNOSIS — Z6824 Body mass index (BMI) 24.0-24.9, adult: Secondary | ICD-10-CM | POA: Diagnosis not present

## 2014-12-04 DIAGNOSIS — Z1389 Encounter for screening for other disorder: Secondary | ICD-10-CM | POA: Diagnosis not present

## 2014-12-04 DIAGNOSIS — M1611 Unilateral primary osteoarthritis, right hip: Secondary | ICD-10-CM | POA: Diagnosis not present

## 2014-12-04 DIAGNOSIS — M810 Age-related osteoporosis without current pathological fracture: Secondary | ICD-10-CM | POA: Diagnosis not present

## 2014-12-04 DIAGNOSIS — G250 Essential tremor: Secondary | ICD-10-CM | POA: Diagnosis not present

## 2014-12-05 DIAGNOSIS — Z1212 Encounter for screening for malignant neoplasm of rectum: Secondary | ICD-10-CM | POA: Diagnosis not present

## 2015-02-28 DIAGNOSIS — Z23 Encounter for immunization: Secondary | ICD-10-CM | POA: Diagnosis not present

## 2015-04-04 ENCOUNTER — Other Ambulatory Visit: Payer: Self-pay

## 2015-04-04 DIAGNOSIS — N644 Mastodynia: Secondary | ICD-10-CM

## 2015-04-08 DIAGNOSIS — N644 Mastodynia: Secondary | ICD-10-CM | POA: Diagnosis not present

## 2015-04-08 DIAGNOSIS — M1611 Unilateral primary osteoarthritis, right hip: Secondary | ICD-10-CM | POA: Diagnosis not present

## 2015-04-08 DIAGNOSIS — Z6824 Body mass index (BMI) 24.0-24.9, adult: Secondary | ICD-10-CM | POA: Diagnosis not present

## 2015-04-08 DIAGNOSIS — L409 Psoriasis, unspecified: Secondary | ICD-10-CM | POA: Diagnosis not present

## 2015-04-11 ENCOUNTER — Other Ambulatory Visit: Payer: Self-pay | Admitting: Internal Medicine

## 2015-04-11 DIAGNOSIS — L409 Psoriasis, unspecified: Secondary | ICD-10-CM | POA: Diagnosis not present

## 2015-04-11 DIAGNOSIS — M150 Primary generalized (osteo)arthritis: Secondary | ICD-10-CM | POA: Diagnosis not present

## 2015-04-11 DIAGNOSIS — N644 Mastodynia: Secondary | ICD-10-CM

## 2015-04-11 DIAGNOSIS — M4120 Other idiopathic scoliosis, site unspecified: Secondary | ICD-10-CM | POA: Diagnosis not present

## 2015-04-11 DIAGNOSIS — M545 Low back pain: Secondary | ICD-10-CM | POA: Diagnosis not present

## 2015-04-11 DIAGNOSIS — I700 Atherosclerosis of aorta: Secondary | ICD-10-CM | POA: Diagnosis not present

## 2015-04-23 ENCOUNTER — Ambulatory Visit
Admission: RE | Admit: 2015-04-23 | Discharge: 2015-04-23 | Disposition: A | Discharge status: Home / Self Care | Payer: Medicare Other | Source: Ambulatory Visit | Attending: Internal Medicine | Admitting: Internal Medicine

## 2015-04-23 DIAGNOSIS — R928 Other abnormal and inconclusive findings on diagnostic imaging of breast: Secondary | ICD-10-CM | POA: Diagnosis not present

## 2015-04-23 DIAGNOSIS — N644 Mastodynia: Secondary | ICD-10-CM

## 2015-12-10 DIAGNOSIS — E559 Vitamin D deficiency, unspecified: Secondary | ICD-10-CM | POA: Diagnosis not present

## 2015-12-10 DIAGNOSIS — M810 Age-related osteoporosis without current pathological fracture: Secondary | ICD-10-CM | POA: Diagnosis not present

## 2015-12-10 DIAGNOSIS — Z0000 Encounter for general adult medical examination without abnormal findings: Secondary | ICD-10-CM | POA: Diagnosis not present

## 2015-12-17 DIAGNOSIS — I4460 Unspecified fascicular block: Secondary | ICD-10-CM | POA: Diagnosis not present

## 2015-12-17 DIAGNOSIS — K219 Gastro-esophageal reflux disease without esophagitis: Secondary | ICD-10-CM | POA: Diagnosis not present

## 2015-12-17 DIAGNOSIS — E559 Vitamin D deficiency, unspecified: Secondary | ICD-10-CM | POA: Diagnosis not present

## 2015-12-17 DIAGNOSIS — G250 Essential tremor: Secondary | ICD-10-CM | POA: Diagnosis not present

## 2015-12-17 DIAGNOSIS — Z0000 Encounter for general adult medical examination without abnormal findings: Secondary | ICD-10-CM | POA: Diagnosis not present

## 2015-12-17 DIAGNOSIS — M1611 Unilateral primary osteoarthritis, right hip: Secondary | ICD-10-CM | POA: Diagnosis not present

## 2015-12-17 DIAGNOSIS — L409 Psoriasis, unspecified: Secondary | ICD-10-CM | POA: Diagnosis not present

## 2015-12-17 DIAGNOSIS — N3281 Overactive bladder: Secondary | ICD-10-CM | POA: Diagnosis not present

## 2015-12-17 DIAGNOSIS — Z1389 Encounter for screening for other disorder: Secondary | ICD-10-CM | POA: Diagnosis not present

## 2015-12-17 DIAGNOSIS — M810 Age-related osteoporosis without current pathological fracture: Secondary | ICD-10-CM | POA: Diagnosis not present

## 2015-12-17 DIAGNOSIS — Z6824 Body mass index (BMI) 24.0-24.9, adult: Secondary | ICD-10-CM | POA: Diagnosis not present

## 2016-01-22 DIAGNOSIS — M25551 Pain in right hip: Secondary | ICD-10-CM | POA: Diagnosis not present

## 2016-02-01 ENCOUNTER — Encounter (HOSPITAL_COMMUNITY): Payer: Self-pay | Admitting: Emergency Medicine

## 2016-02-01 ENCOUNTER — Inpatient Hospital Stay (HOSPITAL_COMMUNITY)
Admission: EM | Admit: 2016-02-01 | Discharge: 2016-02-03 | DRG: 065 | Disposition: A | Discharge status: Rehabilitation Facility | Payer: Medicare Other | Attending: Family Medicine | Admitting: Family Medicine

## 2016-02-01 ENCOUNTER — Inpatient Hospital Stay (HOSPITAL_COMMUNITY): Payer: Medicare Other

## 2016-02-01 ENCOUNTER — Emergency Department (HOSPITAL_COMMUNITY): Payer: Medicare Other

## 2016-02-01 DIAGNOSIS — I63312 Cerebral infarction due to thrombosis of left middle cerebral artery: Secondary | ICD-10-CM | POA: Diagnosis not present

## 2016-02-01 DIAGNOSIS — I639 Cerebral infarction, unspecified: Secondary | ICD-10-CM | POA: Diagnosis not present

## 2016-02-01 DIAGNOSIS — L409 Psoriasis, unspecified: Secondary | ICD-10-CM | POA: Diagnosis present

## 2016-02-01 DIAGNOSIS — R1310 Dysphagia, unspecified: Secondary | ICD-10-CM | POA: Diagnosis present

## 2016-02-01 DIAGNOSIS — I6350 Cerebral infarction due to unspecified occlusion or stenosis of unspecified cerebral artery: Secondary | ICD-10-CM | POA: Diagnosis not present

## 2016-02-01 DIAGNOSIS — Z7983 Long term (current) use of bisphosphonates: Secondary | ICD-10-CM | POA: Diagnosis not present

## 2016-02-01 DIAGNOSIS — G8191 Hemiplegia, unspecified affecting right dominant side: Secondary | ICD-10-CM | POA: Diagnosis not present

## 2016-02-01 DIAGNOSIS — G250 Essential tremor: Secondary | ICD-10-CM | POA: Diagnosis present

## 2016-02-01 DIAGNOSIS — F4321 Adjustment disorder with depressed mood: Secondary | ICD-10-CM | POA: Diagnosis not present

## 2016-02-01 DIAGNOSIS — E785 Hyperlipidemia, unspecified: Secondary | ICD-10-CM | POA: Diagnosis present

## 2016-02-01 DIAGNOSIS — I69322 Dysarthria following cerebral infarction: Secondary | ICD-10-CM | POA: Diagnosis not present

## 2016-02-01 DIAGNOSIS — I69391 Dysphagia following cerebral infarction: Secondary | ICD-10-CM | POA: Diagnosis not present

## 2016-02-01 DIAGNOSIS — Z90710 Acquired absence of both cervix and uterus: Secondary | ICD-10-CM

## 2016-02-01 DIAGNOSIS — D72829 Elevated white blood cell count, unspecified: Secondary | ICD-10-CM

## 2016-02-01 DIAGNOSIS — R4702 Dysphasia: Secondary | ICD-10-CM | POA: Diagnosis present

## 2016-02-01 DIAGNOSIS — Z0389 Encounter for observation for other suspected diseases and conditions ruled out: Secondary | ICD-10-CM | POA: Diagnosis not present

## 2016-02-01 DIAGNOSIS — I69392 Facial weakness following cerebral infarction: Secondary | ICD-10-CM | POA: Diagnosis not present

## 2016-02-01 DIAGNOSIS — I6522 Occlusion and stenosis of left carotid artery: Secondary | ICD-10-CM | POA: Diagnosis not present

## 2016-02-01 DIAGNOSIS — Z8261 Family history of arthritis: Secondary | ICD-10-CM

## 2016-02-01 DIAGNOSIS — Z23 Encounter for immunization: Secondary | ICD-10-CM | POA: Diagnosis not present

## 2016-02-01 DIAGNOSIS — I5189 Other ill-defined heart diseases: Secondary | ICD-10-CM

## 2016-02-01 DIAGNOSIS — I6789 Other cerebrovascular disease: Secondary | ICD-10-CM | POA: Diagnosis not present

## 2016-02-01 DIAGNOSIS — Z9842 Cataract extraction status, left eye: Secondary | ICD-10-CM

## 2016-02-01 DIAGNOSIS — I6601 Occlusion and stenosis of right middle cerebral artery: Secondary | ICD-10-CM | POA: Diagnosis not present

## 2016-02-01 DIAGNOSIS — R471 Dysarthria and anarthria: Secondary | ICD-10-CM | POA: Diagnosis present

## 2016-02-01 DIAGNOSIS — N3281 Overactive bladder: Secondary | ICD-10-CM | POA: Diagnosis not present

## 2016-02-01 DIAGNOSIS — Z9841 Cataract extraction status, right eye: Secondary | ICD-10-CM

## 2016-02-01 DIAGNOSIS — D62 Acute posthemorrhagic anemia: Secondary | ICD-10-CM | POA: Diagnosis present

## 2016-02-01 DIAGNOSIS — I158 Other secondary hypertension: Secondary | ICD-10-CM

## 2016-02-01 DIAGNOSIS — G8111 Spastic hemiplegia affecting right dominant side: Secondary | ICD-10-CM | POA: Diagnosis not present

## 2016-02-01 DIAGNOSIS — M810 Age-related osteoporosis without current pathological fracture: Secondary | ICD-10-CM | POA: Diagnosis present

## 2016-02-01 DIAGNOSIS — K5900 Constipation, unspecified: Secondary | ICD-10-CM | POA: Diagnosis present

## 2016-02-01 DIAGNOSIS — R29818 Other symptoms and signs involving the nervous system: Secondary | ICD-10-CM | POA: Diagnosis not present

## 2016-02-01 DIAGNOSIS — R29810 Facial weakness: Secondary | ICD-10-CM | POA: Diagnosis present

## 2016-02-01 DIAGNOSIS — Z79899 Other long term (current) drug therapy: Secondary | ICD-10-CM | POA: Diagnosis not present

## 2016-02-01 DIAGNOSIS — Z8262 Family history of osteoporosis: Secondary | ICD-10-CM

## 2016-02-01 DIAGNOSIS — R7303 Prediabetes: Secondary | ICD-10-CM | POA: Diagnosis not present

## 2016-02-01 DIAGNOSIS — I5030 Unspecified diastolic (congestive) heart failure: Secondary | ICD-10-CM | POA: Diagnosis present

## 2016-02-01 DIAGNOSIS — I69351 Hemiplegia and hemiparesis following cerebral infarction affecting right dominant side: Secondary | ICD-10-CM | POA: Diagnosis present

## 2016-02-01 DIAGNOSIS — I63311 Cerebral infarction due to thrombosis of right middle cerebral artery: Secondary | ICD-10-CM | POA: Diagnosis not present

## 2016-02-01 DIAGNOSIS — I10 Essential (primary) hypertension: Secondary | ICD-10-CM | POA: Diagnosis present

## 2016-02-01 DIAGNOSIS — I110 Hypertensive heart disease with heart failure: Secondary | ICD-10-CM | POA: Diagnosis present

## 2016-02-01 DIAGNOSIS — IMO0002 Reserved for concepts with insufficient information to code with codable children: Secondary | ICD-10-CM

## 2016-02-01 HISTORY — DX: Cerebral infarction, unspecified: I63.9

## 2016-02-01 LAB — RAPID URINE DRUG SCREEN, HOSP PERFORMED
Amphetamines: NOT DETECTED
Barbiturates: NOT DETECTED
Benzodiazepines: NOT DETECTED
Cocaine: NOT DETECTED
Opiates: NOT DETECTED
Tetrahydrocannabinol: NOT DETECTED

## 2016-02-01 LAB — CBC
HCT: 39 % (ref 36.0–46.0)
Hemoglobin: 12.4 g/dL (ref 12.0–15.0)
MCH: 28.6 pg (ref 26.0–34.0)
MCHC: 31.8 g/dL (ref 30.0–36.0)
MCV: 90.1 fL (ref 78.0–100.0)
Platelets: 285 K/uL (ref 150–400)
RBC: 4.33 MIL/uL (ref 3.87–5.11)
RDW: 13.2 % (ref 11.5–15.5)
WBC: 11.5 K/uL — ABNORMAL HIGH (ref 4.0–10.5)

## 2016-02-01 LAB — PROTIME-INR
INR: 1.02
Prothrombin Time: 13.4 seconds (ref 11.4–15.2)

## 2016-02-01 LAB — I-STAT CHEM 8, ED
BUN: 24 mg/dL — ABNORMAL HIGH (ref 6–20)
Calcium, Ion: 1.19 mmol/L (ref 1.15–1.40)
Chloride: 106 mmol/L (ref 101–111)
Creatinine, Ser: 0.7 mg/dL (ref 0.44–1.00)
Glucose, Bld: 111 mg/dL — ABNORMAL HIGH (ref 65–99)
HCT: 38 % (ref 36.0–46.0)
Hemoglobin: 12.9 g/dL (ref 12.0–15.0)
Potassium: 4.2 mmol/L (ref 3.5–5.1)
Sodium: 141 mmol/L (ref 135–145)
TCO2: 23 mmol/L (ref 0–100)

## 2016-02-01 LAB — URINALYSIS, ROUTINE W REFLEX MICROSCOPIC
Bilirubin Urine: NEGATIVE
Glucose, UA: NEGATIVE mg/dL
Hgb urine dipstick: NEGATIVE
Ketones, ur: NEGATIVE mg/dL
Leukocytes, UA: NEGATIVE
Nitrite: NEGATIVE
Protein, ur: NEGATIVE mg/dL
Specific Gravity, Urine: 1.016 (ref 1.005–1.030)
pH: 6 (ref 5.0–8.0)

## 2016-02-01 LAB — COMPREHENSIVE METABOLIC PANEL
ALT: 13 U/L — ABNORMAL LOW (ref 14–54)
AST: 19 U/L (ref 15–41)
Albumin: 3.3 g/dL — ABNORMAL LOW (ref 3.5–5.0)
Alkaline Phosphatase: 73 U/L (ref 38–126)
Anion gap: 5 (ref 5–15)
BUN: 22 mg/dL — ABNORMAL HIGH (ref 6–20)
CO2: 24 mmol/L (ref 22–32)
Calcium: 9 mg/dL (ref 8.9–10.3)
Chloride: 109 mmol/L (ref 101–111)
Creatinine, Ser: 0.7 mg/dL (ref 0.44–1.00)
GFR calc Af Amer: >60 mL/min (ref >60)
GFR calc non Af Amer: >60 mL/min (ref >60)
Glucose, Bld: 112 mg/dL — ABNORMAL HIGH (ref 65–99)
Potassium: 4.1 mmol/L (ref 3.5–5.1)
Sodium: 138 mmol/L (ref 135–145)
Total Bilirubin: 0.3 mg/dL (ref 0.3–1.2)
Total Protein: 6.5 g/dL (ref 6.5–8.1)

## 2016-02-01 LAB — DIFFERENTIAL
Basophils Absolute: 0 10*3/uL (ref 0.0–0.1)
Basophils Relative: 0 %
Eosinophils Absolute: 0.2 10*3/uL (ref 0.0–0.7)
Eosinophils Relative: 2 %
Lymphocytes Relative: 15 %
Lymphs Abs: 1.8 10*3/uL (ref 0.7–4.0)
Monocytes Absolute: 1 10*3/uL (ref 0.1–1.0)
Monocytes Relative: 9 %
Neutro Abs: 8.5 10*3/uL — ABNORMAL HIGH (ref 1.7–7.7)
Neutrophils Relative %: 74 %

## 2016-02-01 LAB — I-STAT TROPONIN, ED: Troponin i, poc: 0 ng/mL (ref 0.00–0.08)

## 2016-02-01 LAB — ETHANOL: Alcohol, Ethyl (B): <5 mg/dL (ref <5)

## 2016-02-01 LAB — APTT: aPTT: 30 s (ref 24–36)

## 2016-02-01 MED ORDER — HEPARIN SODIUM (PORCINE) 5000 UNIT/ML IJ SOLN
5000.0000 [IU] | Freq: Three times a day (TID) | INTRAMUSCULAR | Status: DC
  Administered 2016-02-01 – 2016-02-03 (×6): 5000 [IU] via SUBCUTANEOUS
  Filled 2016-02-01 (×6): qty 1

## 2016-02-01 MED ORDER — PANTOPRAZOLE SODIUM 40 MG PO TBEC
40.0000 mg | DELAYED_RELEASE_TABLET | Freq: Every day | ORAL | Status: DC
  Administered 2016-02-02 – 2016-02-03 (×2): 40 mg via ORAL
  Filled 2016-02-01 (×2): qty 1

## 2016-02-01 MED ORDER — STROKE: EARLY STAGES OF RECOVERY BOOK
Freq: Once | Status: DC
  Filled 2016-02-01: qty 1

## 2016-02-01 MED ORDER — ACETAMINOPHEN 325 MG PO TABS: 650.0000 mg | ORAL_TABLET | ORAL | Status: DC | PRN

## 2016-02-01 MED ORDER — TOPIRAMATE 25 MG PO TABS
25.0000 mg | ORAL_TABLET | Freq: Two times a day (BID) | ORAL | Status: DC
  Administered 2016-02-02 – 2016-02-03 (×3): 25 mg via ORAL
  Filled 2016-02-01 (×3): qty 1

## 2016-02-01 MED ORDER — SENNOSIDES-DOCUSATE SODIUM 8.6-50 MG PO TABS: 1.0000 | ORAL_TABLET | Freq: Every evening | ORAL | Status: DC | PRN

## 2016-02-01 MED ORDER — ASPIRIN 325 MG PO TABS
325.0000 mg | ORAL_TABLET | Freq: Every day | ORAL | Status: DC
  Administered 2016-02-02 – 2016-02-03 (×2): 325 mg via ORAL
  Filled 2016-02-01 (×2): qty 1

## 2016-02-01 MED ORDER — SIMVASTATIN 40 MG PO TABS: 40.0000 mg | ORAL_TABLET | Freq: Every day | ORAL | Status: DC

## 2016-02-01 MED ORDER — ASPIRIN 300 MG RE SUPP: 300.0000 mg | Freq: Every day | RECTAL | Status: DC

## 2016-02-01 MED ORDER — OXYBUTYNIN CHLORIDE 5 MG PO TABS
5.0000 mg | ORAL_TABLET | Freq: Every day | ORAL | Status: DC
  Administered 2016-02-02: 5 mg via ORAL
  Filled 2016-02-01: qty 1

## 2016-02-01 MED ORDER — ACETAMINOPHEN 650 MG RE SUPP: 650.0000 mg | RECTAL | Status: DC | PRN

## 2016-02-01 MED ORDER — IOPAMIDOL (ISOVUE-370) INJECTION 76%
INTRAVENOUS | Status: AC
  Administered 2016-02-01: 100 mL
  Filled 2016-02-01: qty 100

## 2016-02-01 NOTE — ED Notes (Signed)
Pt failed swallow screen-- coughing after 2 sips-- is ambulatory to bathroom with assistance, is alert/oriented x 4, w/d, lives with husband at home, is normally independent. Husband with patient.

## 2016-02-01 NOTE — Progress Notes (Signed)
Patient to floor at 1844.

## 2016-02-01 NOTE — ED Notes (Signed)
Took pt to restroom via wheelchair 

## 2016-02-01 NOTE — Progress Notes (Signed)
Patient still in ED. Not admitted to at this point

## 2016-02-01 NOTE — ED Provider Notes (Signed)
[facility]-EMERGENCY DEPT Provider Note   CSN: [id number] Arrival date & time: 02/01/16  1344     History   Chief Complaint Chief Complaint  Patient presents with  . Stroke Symptoms    HPI Kelly [name] is a 82 y.o. female.  Patient is a 82 year old female with past medical history of essential tremor, osteoporosis who presents the ED via EMS with complaint of right-sided facial droop, onset 7:30 AM. Patient reports when she woke up at exam she felt fine but denies looking in a mere. She notes around 7:30 when she began eating breakfast her husband reported that he noticed the patient had drooping of the right side of her mouth. Patient reports she was having difficulty eating food due to her facial droop. Patient denies any other pain or complaints at this time. Of note patient reports she fell last week and landed on her buttocks resulting in her having bilateral hip pain. Patient reports she was evaluated by her orthopedist, Dr. Eulah Pont, last week and had x-rays performed which were unremarkable. Patient reports she has had mild pain with ambulation in her hip since the fall. Denies fever, chills, headache, lightheadedness, dizziness, cough, shortness of breath, chest pain, abdominal pain or nausea, vomiting, diarrhea, urinary symptoms, numbness, tingling, weakness, syncope, seizure. Patient reports she took ibuprofen and aspirin this morning due to having pain in her hips from the fall. Denies use of anticoagulants.      Past Medical History:  Diagnosis Date  . Dysphonia 02/20/2013  . Essential and other specified forms of tremor 02/20/2013  . Essential tremor   . Osteoporosis   . Psoriasis     Patient Active Problem List   Diagnosis Date Noted  . Essential and other specified forms of tremor 02/20/2013  . Dysphonia 02/20/2013  . Trigeminal neuralgia 02/20/2013    Past Surgical History:  Procedure Laterality Date  . APPENDECTOMY  1940  . bladder tack    . CATARACT EXTRACTION  Bilateral   . HEMORRHOID SURGERY  1960  . HUMERUS FRACTURE SURGERY    . LAPAROSCOPIC HYSTERECTOMY  2006  . torn rotator cuff    . WRIST FRACTURE SURGERY  2005   seconday shoulder 2001    OB History    No data available       Home Medications    Prior to Admission medications   Medication Sig Start Date End Date Taking? Authorizing Provider  alendronate (FOSAMAX) 70 MG tablet Take 70 mg by mouth once a week. Take with a full glass of water on an empty stomach.   Yes Historical Provider, MD  esomeprazole (NEXIUM) 40 MG capsule Take 40 mg by mouth daily at 12 noon.   Yes Historical Provider, MD  ibuprofen (ADVIL,MOTRIN) 200 MG tablet Take 400 mg by mouth every 6 (six) hours as needed for moderate pain.    Yes Historical Provider, MD  oxybutynin (DITROPAN) 5 MG tablet Take 5 mg by mouth at bedtime.   Yes Historical Provider, MD  topiramate (TOPAMAX) 25 MG tablet Take 25 mg by mouth 2 (two) times daily.   Yes Historical Provider, MD  ALPRAZolam (XANAX) 0.25 MG tablet Take 1 tablet (0.25 mg total) by mouth 3 (three) times daily as needed (tremor). Patient not taking: Reported on 02/01/2016 02/20/13   York Spaniel, MD    Family History Family History  Problem Relation Age of Onset  . Arthritis Brother     spine  . Osteoporosis Maternal Grandmother     Social  History Social History  Substance Use Topics  . Smoking status: Never Smoker  . Smokeless tobacco: Never Used  . Alcohol use Yes     Comment: Wine 2 glass daily     Allergies   Review of patient's allergies indicates no known allergies.   Review of Systems Review of Systems  Neurological: Positive for facial asymmetry (right sided facial droop).  All other systems reviewed and are negative.    Physical Exam Updated Vital Signs BP 135/76 (BP Location: Right Arm)   Pulse 73   Temp 98.6 F (37 C) (Oral)   Resp 18   Ht 5' (1.524 m)   Wt 52.2 kg   SpO2 97%   BMI 22.46 kg/m   Physical Exam    Constitutional: She is oriented to person, place, and time. She appears well-developed and well-nourished.  HENT:  Head: Normocephalic and atraumatic. Head is without raccoon's eyes, without Battle's sign, without abrasion, without contusion and without laceration.  Right Ear: Tympanic membrane normal. No hemotympanum.  Left Ear: Tympanic membrane normal. No hemotympanum.  Nose: Nose normal.  Mouth/Throat: Uvula is midline, oropharynx is clear and moist and mucous membranes are normal. No oropharyngeal exudate, posterior oropharyngeal edema, posterior oropharyngeal erythema or tonsillar abscesses. No tonsillar exudate.  Eyes: Conjunctivae and EOM are normal. Right eye exhibits no discharge. Left eye exhibits no discharge. No scleral icterus.  Neck: Normal range of motion. Neck supple.  Cardiovascular: Normal rate, regular rhythm, normal heart sounds and intact distal pulses.   Pulmonary/Chest: Effort normal and breath sounds normal. No respiratory distress. She has no wheezes. She has no rales. She exhibits no tenderness.  Abdominal: Soft. Bowel sounds are normal. She exhibits no distension and no mass. There is no tenderness. There is no rebound and no guarding. No hernia.  Musculoskeletal: Normal range of motion. She exhibits no edema or tenderness.  No midline C, T, or L tenderness. Full range of motion of neck and back. Full range of motion of bilateral upper and lower extremities, with 5/5 strength. Sensation intact. 2+ radial and PT pulses. Cap refill <2 seconds.   Neurological: She is alert and oriented to person, place, and time. She has normal strength. She displays tremor (bilateral hand tremor, pt reports is chronic and unchanged). No sensory deficit. She displays a negative Romberg sign. Coordination normal.  CN exam showed right sided facial droop of mouth present with mouth closed and with smiling. Pt able to raise eyebrows. Sensation intact bilaterally with facial nerve. Remaining CNs  intact.   Skin: Skin is warm and dry.  Nursing note and vitals reviewed.    ED Treatments / Results  Labs (all labs ordered are listed, but only abnormal results are displayed) Labs Reviewed  CBC - Abnormal; Notable for the following:       Result Value   WBC 11.5 (*)    All other components within normal limits  DIFFERENTIAL - Abnormal; Notable for the following:    Neutro Abs 8.5 (*)    All other components within normal limits  COMPREHENSIVE METABOLIC PANEL - Abnormal; Notable for the following:    Glucose, Bld 112 (*)    BUN 22 (*)    Albumin 3.3 (*)    ALT 13 (*)    All other components within normal limits  I-STAT CHEM 8, ED - Abnormal; Notable for the following:    BUN 24 (*)    Glucose, Bld 111 (*)    All other components within normal limits  ETHANOL  PROTIME-INR  APTT  URINE RAPID DRUG SCREEN, HOSP PERFORMED  URINALYSIS, ROUTINE W REFLEX MICROSCOPIC (NOT AT [facility])  I-STAT TROPOININ, ED    EKG  EKG Interpretation  Date/Time:  Saturday February 01 2016 13:57:37 EDT Ventricular Rate:  79 PR Interval:    QRS Duration: 94 QT Interval:  419 QTC Calculation: 481 R Axis:   -71 Text Interpretation:  Sinus rhythm Left anterior fascicular block Abnormal R-wave progression, early transition Borderline T wave abnormalities Confirmed by Rubin Payor  MD, Harrold Donath [phone]) on 02/01/2016 4:03:40 PM       Radiology Mr Brain Wo Contrast  Result Date: 02/01/2016 CLINICAL DATA:  Right-sided facial droop. Stroke like symptoms. Slurred speech. Symptoms began at 7:30 a.m. EXAM: MRI HEAD WITHOUT CONTRAST TECHNIQUE: Multiplanar, multiecho pulse sequences of the brain and surrounding structures were obtained without intravenous contrast. COMPARISON:  CT head without contrast 03/07/2013 FINDINGS: Brain: Acute nonhemorrhagic linear infarct is present within the posterior limb of the left internal capsule measuring up to 14 mm. No other acute infarct is present. Extensive dilated  perivascular spaces are present throughout the basal ganglia and thalami. Advanced atrophy and diffuse white matter disease is present bilaterally. The ventricles are proportionate to the degree of atrophy. White matter changes extend into the brainstem. The brainstem and cerebellum are otherwise unremarkable. Internal auditory canals are within normal limits. Vascular: Flow is present in the major intracranial arteries. Skull and upper cervical spine: The skullbase is within normal limits. Midline sagittal images demonstrate an infarct involving the anterior corpus callosum. Midline intracranial structures are otherwise within normal limits. Mild degenerative changes are present in the upper cervical spine, and consistent with a age. Sinuses/Orbits: The paranasal sinuses and mastoid air cells are clear. Bilateral lens replacements are present. The globes and orbits are intact. Other: IMPRESSION: 1. Acute nonhemorrhagic 1.4 cm linear infarct within the posterior limb of the left internal capsule. This corresponds with the patient's right-sided symptoms and a aphasia. 2. Advanced atrophy and diffuse white matter disease likely reflects the sequela of chronic microvascular ischemia without other acute infarct. 3. Dilated perivascular spaces throughout the basal ganglia. These results were called by telephone at the time of interpretation on 02/01/2016 at 3:58 pm to Dr. Rubin Payor, who verbally acknowledged these results. Electronically Signed   By: Marin Roberts M.D.   On: 02/01/2016 16:01    Procedures Procedures (including critical care time)  Medications Ordered in ED Medications - No data to display   Initial Impression / Assessment and Plan / ED Course  I have reviewed the triage vital signs and the nursing notes.  Pertinent labs & imaging results that were available during my care of the patient were reviewed by me and considered in my medical decision making (see chart for details).  Clinical  Course   Pt presents with new onset right sided facial droop. Hx of essential tremor. Reports fall last week and notes she was seen by her orthopedist, hip xrays negative. Pt took aspirin and advil this morning. VSS. Exam revealed drooping of right side of mouth, remaining neuro exam unremarkable. No other cranial nerve deficits.   Pt with neurological deficit with vague onset, started appx 6.5 hours PTA, pt outside TPA window, code stroke was not initiated. Consulted neurology. Dr. Amada Jupiter advised to order MR brain without to evaluate for stroke.   WBC 11.5. Remaining labs and urine unremarkable. EKG showed sinus rhythm without acute ischemic changes. MRI brain pending.   MRI brain showed acute nonhemorrhagic 1.4  cm linear infarct within the posterior limb of the left internal capsule. Neurology notified about pt's MRI results. Discussed results and plan for admission with pt and family. Consulted hospitalist for admission. Marlowe KaysSara Wertman, PA-C agrees to admission. She reports she will come evaluate in the ED and placed admission orders.   Final Clinical Impressions(s) / ED Diagnoses   Final diagnoses:  Cerebral infarction due to unspecified mechanism    New Prescriptions New Prescriptions   No medications on file     Barrett Henleicole Elizabeth Nadeau, PA-C 02/01/16 1629    Benjiman CoreNathan Pickering, MD 02/02/16 [phone]

## 2016-02-01 NOTE — ED Triage Notes (Signed)
To ED via [facility] from home with c/o stroke like symptoms -- LSN unknown-- husband noticed pt had a facial droop and slurred speech at 0730.-- was dribbling food and drink at breakfast. Pt is alert/oriented x 4 on arrival.

## 2016-02-01 NOTE — Progress Notes (Signed)
Patient admitted to 5M01. Patient is alert, oriented x4, denies pain, no skin issues, tele set up. Oriented to room, unit, floor. Will continue to monitor.

## 2016-02-01 NOTE — Consult Note (Signed)
Neurology Consultation Reason for Consult: Facial weakness Referring Physician: Ruel Favors  CC: Right facial weakness  History is obtained from: Patient  HPI: Kelly [name] is a 82 y.o. female who awoke this morning normal at 6 AM. She then began feeling unwell half hour or so later lay back down awakening at 7:30 noticing right facial weakness. She states that she also noticed slurring. She is not sure if she notices much weakness on her right side, but notes that she has chronic problems with both legs, more so since her recent fall.   LKW: 6:30 AM tpa given?: no, out of window    ROS: A 14 point ROS was performed and is negative except as noted in the HPI.   Past Medical History:  Diagnosis Date  . Dysphonia 02/20/2013  . Essential and other specified forms of tremor 02/20/2013  . Essential tremor   . Osteoporosis   . Psoriasis      Family History  Problem Relation Age of Onset  . Arthritis Brother     spine  . Osteoporosis Maternal Grandmother      Social History:  reports that she has never smoked. She has never used smokeless tobacco. She reports that she drinks alcohol. She reports that she does not use drugs.   Exam: Current vital signs: BP 164/80 (BP Location: Right Arm)   Pulse 82   Temp 98.3 F (36.8 C) (Oral)   Resp 14   Ht 5' (1.524 m)   Wt 52.2 kg (115 lb)   SpO2 99%   BMI 22.46 kg/m  Vital signs in last 24 hours: Temp:  [98.3 F (36.8 C)-98.6 F (37 C)] 98.3 F (36.8 C) (09/09 1632) Pulse Rate:  [73-82] 82 (09/09 1714) Resp:  [14-18] 14 (09/09 1714) BP: (135-164)/(68-80) 164/80 (09/09 1714) SpO2:  [97 %-99 %] 99 % (09/09 1714) Weight:  [52.2 kg (115 lb)] 52.2 kg (115 lb) (09/09 1404)   Physical Exam  Constitutional: Appears well-developed and well-nourished.  Psych: Affect appropriate to situation Eyes: No scleral injection HENT: No OP obstrucion Head: Normocephalic.  Cardiovascular: Normal rate and regular rhythm.  Respiratory:  Effort normal and breath sounds normal to anterior ascultation GI: Soft.  No distension. There is no tenderness.  Skin: WDI  Neuro: Mental Status: Patient is awake, alert, oriented to person, place, month, year, and situation. Patient is able to give a clear and coherent history. No signs of aphasia or neglect Cranial Nerves: II: Visual Fields are full. Pupils are equal, round, and reactive to light.   III,IV, VI: EOMI without ptosis or diploplia.  V: Facial sensation is symmetric to temperature VII: Facial movement is decreased on the right VIII: hearing is intact to voice X: Uvula elevates symmetrically XI: Shoulder shrug is symmetric. XII: tongue is midline without atrophy or fasciculations.  Motor: Tone is normal. Bulk is normal. 5/5 strength was present on the left side, she has very mild 4+/5 weakness of the right arm and leg Sensory: Sensation is symmetric to light touch and temperature in the arms and legs. Cerebellar: FNF  intact bilaterally      I have reviewed labs in epic and the results pertinent to this consultation are: Chem 8-unremarkable  I have reviewed the images obtained: MRI brain-small subcortical infarct  Impression: 82 year old female with small subcortical infarct. She will need to be admitted for physical therapy and further evaluation.  Recommendations: 1. HgbA1c, fasting lipid panel 2. CT Angio head and neck 3. Frequent neuro checks  4. Echocardiogram 5. Carotid dopplers are not needed given CTA neck 6. Prophylactic therapy-Antiplatelet med: Aspirin - dose 325mg  PO or 300mg  PR 7. Risk factor modification 8. Telemetry monitoring 9. PT consult, OT consult, Speech consult 10. please page stroke NP  Or  PA  Or MD  M-F from 8am -4 pm starting 9/10 as this patient will be followed by the stroke team at this point.   You can look them up on [email or web address]      [name], MD [facility] [phone]  If 7pm- 7am, please  page neurology on call as listed in AMION.

## 2016-02-01 NOTE — ED Notes (Signed)
Next neuro check at 2000.

## 2016-02-01 NOTE — H&P (Signed)
History and Physical    Kelly [name] ZOX:[id number] DOB: 1927/12/31 DOA: 02/01/2016   PCP: Gaspar Garbe, MD   Patient coming from:  Home   Chief Complaint: Right facial droop  HPI: Kelly [name] is a 82 y.o. female with medical history significant for essential tremor, psoriasis and osteoporosis,, presenting with acute onset f R facial droop since 7:30 without any other focal abnormalities. Patient  never had a similar episode.Denis any history of TIA. Denies vertigo dizziness or vision changes. Denies headaches. No dysphagia. No confusion. No seizures. No syncope or presyncope, no falls.  Denies any chest pain, or shortness of breath. Denies any fever or chills, or night sweats.Does not smoke. No new  hormonal supplements. She has recently purchased "CBD" as antinflammatory OTC product to help treat arthritic pain. No syncope or presyncope. Does not take ASA although she took one today due to symptoms. No other antiplatelets or anticoagulants. Patient is compliant with his medications. Last  long distance trip was to [place] 3 weeks ago. No recent surgeries. No sick contacts. No new stressors present in personal life. Patient is very active. She is not a diabetic. No family history of stroke. No tobacco or ETOH or recreational drugs. Last physical was on July of 2017, without any other abnormalities.  Patient was not administered TPA as is beyond time window for treatment consideration. Will admit for further evaluation and treatment.  ED Course:  BP 164/80 (BP Location: Right Arm)   Pulse 82   Temp 98.3 F (36.8 C) (Oral)   Resp 14   Ht 5' (1.524 m)   Wt 52.2 kg (115 lb)   SpO2 99%   BMI 22.46 kg/m      Review of Systems: As per HPI otherwise 10 point review of systems negative.   Past Medical History:  Diagnosis Date  . Dysphonia 02/20/2013  . Essential and other specified forms of tremor 02/20/2013  . Essential tremor   . Osteoporosis   . Psoriasis     Past Surgical  History:  Procedure Laterality Date  . APPENDECTOMY  1940  . bladder tack    . CATARACT EXTRACTION Bilateral   . HEMORRHOID SURGERY  1960  . HUMERUS FRACTURE SURGERY    . LAPAROSCOPIC HYSTERECTOMY  2006  . torn rotator cuff    . WRIST FRACTURE SURGERY  2005   seconday shoulder 2001    Social History Social History   Social History  . Marital status: Married    Spouse name: N/A  . Number of children: 3  . Years of education: college   Occupational History  . real estate     retired   Social History Main Topics  . Smoking status: Never Smoker  . Smokeless tobacco: Never Used  . Alcohol use Yes     Comment: Wine 2 glass daily  . Drug use: No  . Sexual activity: Not on file   Other Topics Concern  . Not on file   Social History Narrative  . No narrative on file     No Known Allergies  Family History  Problem Relation Age of Onset  . Arthritis Brother     spine  . Osteoporosis Maternal Grandmother       Prior to Admission medications   Medication Sig Start Date End Date Taking? Authorizing Provider  alendronate (FOSAMAX) 70 MG tablet Take 70 mg by mouth once a week. Take with a full glass of water on an empty stomach.  Yes Historical Provider, MD  esomeprazole (NEXIUM) 40 MG capsule Take 40 mg by mouth daily at 12 noon.   Yes Historical Provider, MD  ibuprofen (ADVIL,MOTRIN) 200 MG tablet Take 400 mg by mouth every 6 (six) hours as needed for moderate pain.    Yes Historical Provider, MD  oxybutynin (DITROPAN) 5 MG tablet Take 5 mg by mouth at bedtime.   Yes Historical Provider, MD  topiramate (TOPAMAX) 25 MG tablet Take 25 mg by mouth 2 (two) times daily.   Yes Historical Provider, MD  ALPRAZolam (XANAX) 0.25 MG tablet Take 1 tablet (0.25 mg total) by mouth 3 (three) times daily as needed (tremor). Patient not taking: Reported on 02/01/2016 02/20/13   York Spaniel, MD    Physical Exam:    Vitals:   02/01/16 1404 02/01/16 1545 02/01/16 1632 02/01/16  1714  BP:  135/76 149/68 164/80  Pulse:  73 74 82  Resp:  18 18 14   Temp:  98.6 F (37 C) 98.3 F (36.8 C)   TempSrc:  Oral Oral   SpO2:  97% 97% 99%  Weight: 52.2 kg (115 lb)     Height: 5' (1.524 m)          Constitutional: NAD, calm, mildly anxious Vitals:   02/01/16 1404 02/01/16 1545 02/01/16 1632 02/01/16 1714  BP:  135/76 149/68 164/80  Pulse:  73 74 82  Resp:  18 18 14   Temp:  98.6 F (37 C) 98.3 F (36.8 C)   TempSrc:  Oral Oral   SpO2:  97% 97% 99%  Weight: 52.2 kg (115 lb)     Height: 5' (1.524 m)      Eyes: PERRL, lids and conjunctivae normal ENMT: Mucous membranes are moist. Posterior pharynx clear of any exudate or lesions.Normal dentition. Tongue slightly deviated to the R  Neck: normal, supple, no masses, no thyromegaly Respiratory: clear to auscultation bilaterally, no wheezing, no crackles. Normal respiratory effort. No accessory muscle use.  Cardiovascular: Regular rate and rhythm, no murmurs / rubs / gallops. No extremity edema. 2+ pedal pulses. No carotid bruits.  Abdomen: no tenderness, no masses palpated. No hepatosplenomegaly. Bowel sounds positive.  Musculoskeletal: no clubbing / cyanosis. No joint deformity upper and lower extremities. Good ROM, no contractures. Normal muscle tone.  Skin: no rashes, lesions, ulcers.  Neurologic: Remarkable for right facial droop otherwise non focal  Psychiatric: Normal judgment and insight. Alert and oriented x 3. Normal mood.     Labs on Admission: I have personally reviewed following labs and imaging studies  CBC:  Recent Labs Lab 02/01/16 1418 02/01/16 1443  WBC 11.5*  --   NEUTROABS 8.5*  --   HGB 12.4 12.9  HCT 39.0 38.0  MCV 90.1  --   PLT 285  --     Basic Metabolic Panel:  Recent Labs Lab 02/01/16 1418 02/01/16 1443  NA 138 141  K 4.1 4.2  CL 109 106  CO2 24  --   GLUCOSE 112* 111*  BUN 22* 24*  CREATININE 0.70 0.70  CALCIUM 9.0  --     GFR: Estimated Creatinine Clearance:  36.3 mL/min (by C-G formula based on SCr of 0.8 mg/dL).  Liver Function Tests:  Recent Labs Lab 02/01/16 1418  AST 19  ALT 13*  ALKPHOS 73  BILITOT 0.3  PROT 6.5  ALBUMIN 3.3*   No results for input(s): LIPASE, AMYLASE in the last 168 hours. No results for input(s): AMMONIA in the last 168 hours.  Coagulation  Profile:  Recent Labs Lab 02/01/16 1418  INR 1.02    Cardiac Enzymes: No results for input(s): CKTOTAL, CKMB, CKMBINDEX, TROPONINI in the last 168 hours.  BNP (last 3 results) No results for input(s): PROBNP in the last 8760 hours.  HbA1C: No results for input(s): HGBA1C in the last 72 hours.  CBG: No results for input(s): GLUCAP in the last 168 hours.  Lipid Profile: No results for input(s): CHOL, HDL, LDLCALC, TRIG, CHOLHDL, LDLDIRECT in the last 72 hours.  Thyroid Function Tests: No results for input(s): TSH, T4TOTAL, FREET4, T3FREE, THYROIDAB in the last 72 hours.  Anemia Panel: No results for input(s): VITAMINB12, FOLATE, FERRITIN, TIBC, IRON, RETICCTPCT in the last 72 hours.  Urine analysis:    Component Value Date/Time   COLORURINE YELLOW 02/01/2016 1452   APPEARANCEUR CLEAR 02/01/2016 1452   LABSPEC 1.016 02/01/2016 1452   PHURINE 6.0 02/01/2016 1452   GLUCOSEU NEGATIVE 02/01/2016 1452   HGBUR NEGATIVE 02/01/2016 1452   BILIRUBINUR NEGATIVE 02/01/2016 1452   KETONESUR NEGATIVE 02/01/2016 1452   PROTEINUR NEGATIVE 02/01/2016 1452   NITRITE NEGATIVE 02/01/2016 1452   LEUKOCYTESUR NEGATIVE 02/01/2016 1452    Sepsis Labs: @LABRCNTIP (procalcitonin:4,lacticidven:4) )No results found for this or any previous visit (from the past 240 hour(s)).   Radiological Exams on Admission: Mr Brain Wo Contrast  Result Date: 02/01/2016 CLINICAL DATA:  Right-sided facial droop. Stroke like symptoms. Slurred speech. Symptoms began at 7:30 a.m. EXAM: MRI HEAD WITHOUT CONTRAST TECHNIQUE: Multiplanar, multiecho pulse sequences of the brain and surrounding  structures were obtained without intravenous contrast. COMPARISON:  CT head without contrast 03/07/2013 FINDINGS: Brain: Acute nonhemorrhagic linear infarct is present within the posterior limb of the left internal capsule measuring up to 14 mm. No other acute infarct is present. Extensive dilated perivascular spaces are present throughout the basal ganglia and thalami. Advanced atrophy and diffuse white matter disease is present bilaterally. The ventricles are proportionate to the degree of atrophy. White matter changes extend into the brainstem. The brainstem and cerebellum are otherwise unremarkable. Internal auditory canals are within normal limits. Vascular: Flow is present in the major intracranial arteries. Skull and upper cervical spine: The skullbase is within normal limits. Midline sagittal images demonstrate an infarct involving the anterior corpus callosum. Midline intracranial structures are otherwise within normal limits. Mild degenerative changes are present in the upper cervical spine, and consistent with a age. Sinuses/Orbits: The paranasal sinuses and mastoid air cells are clear. Bilateral lens replacements are present. The globes and orbits are intact. Other: IMPRESSION: 1. Acute nonhemorrhagic 1.4 cm linear infarct within the posterior limb of the left internal capsule. This corresponds with the patient's right-sided symptoms and a aphasia. 2. Advanced atrophy and diffuse white matter disease likely reflects the sequela of chronic microvascular ischemia without other acute infarct. 3. Dilated perivascular spaces throughout the basal ganglia. These results were called by telephone at the time of interpretation on 02/01/2016 at 3:58 pm to Dr. Rubin Payor, who verbally acknowledged these results. Electronically Signed   By: Marin Roberts M.D.   On: 02/01/2016 16:01    EKG: Independently reviewed.  Assessment/Plan Active Problems:   Stroke (cerebrum) (HCC)   Facial droop due to  stroke   Acute stroke like symptoms/right facial droop//Acute CVA Not a TPA candidate as her last known normal was early this morning, MRI brain showed acute nonhemorrhagic 1.4 cm linear infarct within the posterior limb of the left internal capsule. Patient was not on ASA prior to admission - [facility] to [facility] /  Inpatient Stroke order set Bilateral carotid ultrasound Allow permissive HTN -Echo  -PT/OT/SLP  lipid panel  Check A1C -Aspirin  Anticoagulation  -Neuro followingSocial Work, likely to need inpatient rehab   DVT prophylaxis: Heparin  Code Status:   Full  Family Communication:  Discussed with patient  Disposition Plan: Expect patient to be discharged to home after condition improves Consults called:    Neuro Admission status:Tele Inpatient    Providence HospitalWERTMAN,Satomi Buda E, PA-C [facility]   If 7PM-7AM, please contact night-coverage [email or web address] Password [email or web address]  02/01/2016, 5:21 PM

## 2016-02-02 ENCOUNTER — Encounter (HOSPITAL_COMMUNITY): Payer: Medicare Other

## 2016-02-02 DIAGNOSIS — I10 Essential (primary) hypertension: Secondary | ICD-10-CM

## 2016-02-02 DIAGNOSIS — G250 Essential tremor: Secondary | ICD-10-CM

## 2016-02-02 DIAGNOSIS — E785 Hyperlipidemia, unspecified: Secondary | ICD-10-CM

## 2016-02-02 LAB — LIPID PANEL
Cholesterol: 162 mg/dL (ref 0–200)
HDL: 40 mg/dL — ABNORMAL LOW (ref >40)
LDL Cholesterol: 107 mg/dL — ABNORMAL HIGH (ref 0–99)
Total CHOL/HDL Ratio: 4.1 RATIO
Triglycerides: 75 mg/dL (ref <150)
VLDL: 15 mg/dL (ref 0–40)

## 2016-02-02 MED ORDER — ATORVASTATIN CALCIUM 40 MG PO TABS
40.0000 mg | ORAL_TABLET | Freq: Every day | ORAL | Status: DC
  Administered 2016-02-02 – 2016-02-03 (×2): 40 mg via ORAL
  Filled 2016-02-02 (×2): qty 1

## 2016-02-02 NOTE — Evaluation (Signed)
Physical Therapy Evaluation Patient Details Name: TERI LEGACY MRN: [id number] DOB: [birth year] Today's Date: 02/02/2016   History of Present Illness  82 y.o.femalewith medical history significant for essential tremor, psoriasis and osteoporosis presenting with acute onset of R facial droop. MRI on 9/9 + for acute infarct within the posterior limb of the L internal capsule.     Clinical Impression  Pt presents with moderate to severe functional limitations due to decr motor control, decr strength, decr AROM, balance dysfunction, needing at least moderate assistance for basic functional tasks.  Pt will benefit greatly from intensive postacute rehab to address deficits and maximize return of function to this previously active individual.  CIR consult placed previously.  PT will initiate care in acute setting and coordinate with care team for optimal d/c plan.  See below for details of exam findings and refer to Care Plan for goals of care.        Follow Up Recommendations CIR    Equipment Recommendations       Recommendations for Other Services Rehab consult     Precautions / Restrictions Precautions Precautions: Fall Precaution Comments: up w/assist, right side weakness; hx fall 2 months ago; right hip bursitis      Mobility  Bed Mobility Overal bed mobility: Needs Assistance Bed Mobility: Supine to Sit     Supine to sit: Min assist;HOB elevated     General bed mobility comments: able to transition to left EOB with use of rail and multiple effortful but ineffective scoots to get to edge, needs physical assist (draw sheet) and faciliation with instructional cues to reciprocally scoot  Transfers Overall transfer level: Needs assistance Equipment used: 1 person hand held assist Transfers: Sit to/from [employer] Transfers Sit to Stand: Mod assist Stand pivot transfers: Mod assist       General transfer comment: transition to stand with instructional cue to push  from bed rather than pull on PT; pt mildly fearful, legs against bed for stability and gradually more unsteady over time; improved on second trial to stand and took pivotal steps to chair with frequent cues to extend R knee prior to loading; physical assit to control speed and direction of descent into sitting.  Ambulation/Gait Ambulation/Gait assistance: Max assist Ambulation Distance (Feet): 1 Feet Assistive device: 1 person hand held assist       General Gait Details: pregait activity at EOB including supported wt shifting with verbal/tactile cues and facilitation at R knee to promote extension prior to loading; stepping forward/backward with Right slow and effortful; pivotal steps toward impaired side with physical assist/tactile cues to promote R knee extension prior to wt shifting  Stairs            Wheelchair Mobility    Modified Rankin (Stroke Patients Only) Modified Rankin (Stroke Patients Only) Pre-Morbid Rankin Score: No significant disability Modified Rankin: Moderately severe disability     Balance Overall balance assessment: Needs assistance Sitting-balance support: Feet supported;Single extremity supported Sitting balance-Leahy Scale: Poor Sitting balance - Comments: initially unstable in sitting, pt then oriented but with kyphotic posture and left lean bias pt did not assume neutral position; able to hold self up at EOB with 1 UE support Postural control: Left lateral lean Standing balance support: During functional activity;Bilateral upper extremity supported Standing balance-Leahy Scale: Zero Standing balance comment: unable to maintain standing unless moderate to maximal suppport provided externally Single Leg Stance - Right Leg: 0 Single Leg Stance - Left Leg: 0     [place]homberg -  Eyes Opened: 0                   Pertinent Vitals/Pain Pain Assessment: No/denies pain    Home Living Family/patient expects to be discharged to:: Private  residence Living Arrangements: Spouse/significant other Available Help at Discharge: Family;Available 24 hours/day Type of Home: House Home Access: Stairs to enter   Enter[employer] CorporationEntrance Stairs-Number of Steps: 2 Home Layout: Two level;Able to live on main level with bedroom/bathroom Home Equipment: Walker - 4 wheels      Prior Function Level of Independence: Independent with assistive device(s)         Comments: Pt had a fall a few weeks ago while she was out dancing with her husband; since then she has been using a rollator for all mobility. Prior to fall pt was independent with mobility and ADL with use of AD. Has housekeeper that does cleaning every other week.     Hand Dominance   Dominant Hand: Right    Extremity/Trunk Assessment   Upper Extremity Assessment: Defer to OT evaluation           Lower Extremity Assessment: RLE deficits/detail RLE Deficits / Details: grossly 3/5 hip, 3-/5 knee, 2/5 ankle with lack of coordination and demonstrable buckling at knee with standing activites needing cues to extend R knee prior to wt beargin    Cervical / Trunk Assessment: Kyphotic  Communication   Communication: Expressive difficulties (dysphasia, word finding occasionally)  Cognition Arousal/Alertness: Awake/alert Behavior During Therapy: WFL for tasks assessed/performed Overall Cognitive Status: Within Functional Limits for tasks assessed                      General Comments      Exercises        Assessment/Plan    PT Assessment Patient needs continued PT services  PT Diagnosis Hemiplegia dominant side   PT Problem List Decreased knowledge of use of DME;Decreased coordination;Decreased mobility;Decreased balance;Decreased activity tolerance;Decreased range of motion;Decreased strength  PT Treatment Interventions Patient/family education;Neuromuscular re-education;Balance training;Therapeutic exercise;Therapeutic activities;Functional mobility training;Gait  training;DME instruction   PT Goals (Current goals can be found in the Care Plan section) Acute Rehab PT Goals Patient Stated Goal: get better PT Goal Formulation: With patient/family Time For Goal Achievement: 02/16/16 Potential to Achieve Goals: Good    Frequency Min 4X/week   Barriers to discharge Decreased caregiver support likely needs to be at supervison to min assist for home    Co-evaluation               End of Session Equipment Utilized During Treatment: Gait belt Activity Tolerance: Patient tolerated treatment well Patient left: in chair;with call bell/phone within reach;with family/visitor present Nurse Communication: Mobility status         Time: 1320-1400 PT Time Calculation (min) (ACUTE ONLY): 40 min   Charges:   PT Evaluation $PT Eval Moderate Complexity: 1 Procedure PT Treatments $Therapeutic Activity: 23-37 mins   PT G Codes:        Dennis BastMartin, Erez Mccallum Galloway 02/02/2016, 2:17 PM

## 2016-02-02 NOTE — Progress Notes (Signed)
PROGRESS NOTE  Regenia Erck App  MWN:[id number] DOB: [birth year] DOA: 02/01/2016 PCP: Gaspar Garbe, MD  Outpatient Specialists: GI, Dr. Russella Dar; Dr. Anne Hahn, Neurology.  Brief Narrative: Kelly [name] is a essentially healthy 82 y.o. female who presented to [facility] 9/9 after noticing acute right facial weakness and slurred speech. MRI showed acute nonhemorrhagic infarct of the left posterior limb of the internal capsule. She was outside the window for tPA and was admitted for further work up. Aspirin was started per neurology consultant.    Assessment & Plan: Active Problems:   Stroke (cerebrum) (HCC)   Facial droop due to stroke  Acute nonhemorrhagic infarct of the posterior limb of left internal capsule: With resultant right facial droop, dysphasia, and dysphagia. Age is only major risk factor.  - Admitted to neuro floor on telemetry, neurology consultation appreciated.  - Secondary stroke prevention: Started ASA 325mg  (none prior) - Cardiac monitoring - CTA head/neck: Essentially negative neck, and no large vessel occlusion or high-grade stenosis. - 2D echocardiogram   - PT/OT/SLP: CIR recommended; Dysphagia 3 with nectar-thick liquids recommended by SLP.  - Permissive HTN: Mostly normotensive since admission.  - Hb A1c pending, lipids (LDL 107) - Statin started at admission  DVT prophylaxis: Subcutaneous heparin Code Status: Full Family Communication: Discussed with patient and husband Disposition Plan: Eval for CIR pending completion of work up today.   Consultants:   Neurology, Dr. Roda Shutters  Procedures:   Echo pending  Antimicrobials:  None   Subjective: Pt with unchanged right facial droop and right sided weakness. No chest pain, dyspnea, HA, fever.   Objective: Vitals:   02/02/16 0500 02/02/16 0650 02/02/16 0944 02/02/16 1410  BP: 127/72 (!) 125/49 (!) 141/78 123/63  Pulse: 70 (!) 58 75 66  Resp: 16 16 18 20   Temp:   98.4 F (36.9 C) 98.2 F (36.8 C)  TempSrc:    Oral Oral  SpO2: 97% 95% 99% 98%  Weight:      Height:       No intake or output data in the 24 hours ending 02/02/16 1413 Filed Weights   02/01/16 1404  Weight: 52.2 kg (115 lb)    Examination: General exam: 82 y.o. female in no distress Respiratory system: Non-labored breathing. Clear to auscultation bilaterally.  Cardiovascular system: Regular rate and rhythm. No murmur, rub, or gallop. No JVD, and no pedal edema. Gastrointestinal system: Abdomen soft, non-tender, non-distended, with normoactive bowel sounds. No organomegaly or masses felt. Central nervous system: Alert and oriented. Right facial droop and slurring of speech, tongue midline. Right grip 4/5, right LE 4/5.  Extremities: Warm, no deformities Skin: No rashes, lesions no ulcers Psychiatry: Judgement and insight appear normal. Mood & affect appropriate.   Data Reviewed: I have personally reviewed following labs and imaging studies  CBC:  Recent Labs Lab 02/01/16 1418 02/01/16 1443  WBC 11.5*  --   NEUTROABS 8.5*  --   HGB 12.4 12.9  HCT 39.0 38.0  MCV 90.1  --   PLT 285  --    Basic Metabolic Panel:  Recent Labs Lab 02/01/16 1418 02/01/16 1443  NA 138 141  K 4.1 4.2  CL 109 106  CO2 24  --   GLUCOSE 112* 111*  BUN 22* 24*  CREATININE 0.70 0.70  CALCIUM 9.0  --    GFR: Estimated Creatinine Clearance: 36.3 mL/min (by C-G formula based on SCr of 0.8 mg/dL). Liver Function Tests:  Recent Labs Lab 02/01/16 1418  AST 19  ALT  13*  ALKPHOS 73  BILITOT 0.3  PROT 6.5  ALBUMIN 3.3*   No results for input(s): LIPASE, AMYLASE in the last 168 hours. No results for input(s): AMMONIA in the last 168 hours. Coagulation Profile:  Recent Labs Lab 02/01/16 1418  INR 1.02   Cardiac Enzymes: No results for input(s): CKTOTAL, CKMB, CKMBINDEX, TROPONINI in the last 168 hours. BNP (last 3 results) No results for input(s): PROBNP in the last 8760 hours. HbA1C: No results for input(s): HGBA1C in the  last 72 hours. CBG: No results for input(s): GLUCAP in the last 168 hours. Lipid Profile:  Recent Labs  02/02/16 0745  CHOL 162  HDL 40*  LDLCALC 107*  TRIG 75  CHOLHDL 4.1   Thyroid Function Tests: No results for input(s): TSH, T4TOTAL, FREET4, T3FREE, THYROIDAB in the last 72 hours. Anemia Panel: No results for input(s): VITAMINB12, FOLATE, FERRITIN, TIBC, IRON, RETICCTPCT in the last 72 hours. Urine analysis:    Component Value Date/Time   COLORURINE YELLOW 02/01/2016 1452   APPEARANCEUR CLEAR 02/01/2016 1452   LABSPEC 1.016 02/01/2016 1452   PHURINE 6.0 02/01/2016 1452   GLUCOSEU NEGATIVE 02/01/2016 1452   HGBUR NEGATIVE 02/01/2016 1452   BILIRUBINUR NEGATIVE 02/01/2016 1452   KETONESUR NEGATIVE 02/01/2016 1452   PROTEINUR NEGATIVE 02/01/2016 1452   NITRITE NEGATIVE 02/01/2016 1452   LEUKOCYTESUR NEGATIVE 02/01/2016 1452   Sepsis Labs: @LABRCNTIP (procalcitonin:4,lacticidven:4)  )No results found for this or any previous visit (from the past 240 hour(s)).   Radiology Studies: Ct Angio Head W Or Wo Contrast  Result Date: 02/02/2016 CLINICAL DATA:  Initial evaluation for acute stroke. EXAM: CT ANGIOGRAPHY HEAD AND NECK TECHNIQUE: Multidetector CT imaging of the head and neck was performed using the standard protocol during bolus administration of intravenous contrast. Multiplanar CT image reconstructions and MIPs were obtained to evaluate the vascular anatomy. Carotid stenosis measurements (when applicable) are obtained utilizing NASCET criteria, using the distal internal carotid diameter as the denominator. CONTRAST:  100 cc of Isovue 370. COMPARISON:  Prior MRI from earlier the same day. FINDINGS: CT HEAD Scalp soft tissues within normal limits. Globes and orbits unremarkable. Patient status post lens extraction bilaterally. Senescent calcifications noted. Visualized paranasal sinuses are clear. No mastoid effusion. Middle ear cavities are clear. Calvarium intact.  Age-related cerebral atrophy with moderate chronic microvascular ischemic disease. Known acute lacunar infarct within the left internal capsule not well seen. No acute intracranial hemorrhage. No other acute large vessel territory infarct. No mass lesion, midline shift, or mass effect. No hydrocephalus. No extra-axial fluid collection. CTA NECK Aortic arch: Visualized aortic arch of normal caliber with normal branch pattern. No high-grade stenosis at the origin of the great vessels. Mild scattered plaque within the arch itself. Visualized subclavian arteries widely patent. Right carotid system: Right common carotid artery tortuous proximally. Right common carotid artery patent to the bifurcation without stenosis. No significant atheromatous plaque about the right bifurcation. Right ICA widely patent distally to the skullbase without stenosis, dissection, or occlusion. Right external carotid artery is branches within normal limits. Left carotid system: Left common carotid artery widely patent to the bifurcation. Minimal centric plaque about the left bifurcation without stenosis. Left ICA widely patent from the bifurcation to the skullbase without stenosis, dissection, or occlusion. Vertebral arteries:Both vertebral arteries arise from the subclavian arteries. Pre foraminal left V1 segment tortuous with multifocal atheromatous irregularity with resultant mild multi focal narrowing. Vertebral arteries otherwise widely patent within the neck without stenosis, dissection, or occlusion. Skeleton: No acute osseous abnormality.  No worrisome lytic or blastic osseous lesions. Advanced degenerative spondylolysis noted within cervical spine. Other neck: Visualized lungs are clear. Visualized mediastinum grossly normal. Thyroid within normal limits. No adenopathy within the neck. No acute soft tissue abnormality. CTA HEAD Anterior circulation: Petrous segments widely patent bilaterally. Scattered calcified plaque within the  cavernous ICAs with mild diffuse narrowing. A1 segments patent. Anterior communicating artery normal. Anterior cerebral arteries well opacified to their distal aspects. M1 segments widely patent without stenosis or occlusion. MCA bifurcations normal. Focal short-segment severe stenosis proximal right M2 branch, middle division (series 15, image 63). Distal MCA branches well opacified and symmetric. Posterior circulation: Focal plaque at the right V4 segment as it crosses the dural margin with secondary minimal narrowing. Right vertebral artery otherwise widely patent to the vertebrobasilar junction. Minimal plaque within the left V4 segment without stenosis. Posterior inferior cerebral arteries patent. Basilar artery fenestrated proximally. Basilar artery widely patent to its distal aspect. Superior cerebral arteries patent. Both of the posterior cerebral arteries arise from the basilar artery. Atheromatous irregularity with moderate smooth narrowing of the left P 2 segment. More mild atheromatous irregularity within the right PCA. Venous sinuses: Patent without evidence for venous sinus thrombosis. Anatomic variants: No significant anatomic variant. No aneurysm or vascular malformation. Small focal outpouching arising from the expected location of the takeoff of the left posterior communicating artery favored to reflect a small vascular infundibulum is associated with a hypoplastic left P com. Delayed phase: No pathologic enhancement. IMPRESSION: CTA NECK IMPRESSION: 1. Negative CTA of the neck for patient age. No high-grade or critical stenosis. 2. Mild atheromatous plaque about the left carotid bifurcation without significant stenosis. 3. No significant atheromatous disease within the right carotid artery system. 4. Mild atheromatous plaque within the pre foraminal left V1 segment with resultant mild stenosis. Otherwise widely patent vertebral arteries within the neck. CTA HEAD IMPRESSION: 1. Negative for large  vessel occlusion. No high-grade or correctable stenosis. 2. Short-segment severe nonocclusive stenosis proximal right M2 branch as above. 3. Calcified atheromatous plaque within the cavernous ICAs with mild diffuse narrowing. 4. Mild to moderate multifocal atheromatous irregularity within the PCAs bilaterally, left greater than right. Electronically Signed   By: Rise Mu M.D.   On: 02/02/2016 02:08   Dg Chest 2 View  Result Date: 02/01/2016 CLINICAL DATA:  Stroke-like symptoms. EXAM: CHEST  2 VIEW COMPARISON:  None. FINDINGS: Cardiomediastinal silhouette is normal. Mediastinal contours appear intact. Torturous atherosclerotic aorta. There is no evidence of focal airspace consolidation, pleural effusion or pneumothorax. Osseous structures are without acute abnormality. Soft tissues are grossly normal. Breast implants are noted. IMPRESSION: No active cardiopulmonary disease. Electronically Signed   By: Ted Mcalpine M.D.   On: 02/01/2016 18:15   Ct Angio Neck W Or Wo Contrast  Result Date: 02/02/2016 CLINICAL DATA:  Initial evaluation for acute stroke. EXAM: CT ANGIOGRAPHY HEAD AND NECK TECHNIQUE: Multidetector CT imaging of the head and neck was performed using the standard protocol during bolus administration of intravenous contrast. Multiplanar CT image reconstructions and MIPs were obtained to evaluate the vascular anatomy. Carotid stenosis measurements (when applicable) are obtained utilizing NASCET criteria, using the distal internal carotid diameter as the denominator. CONTRAST:  100 cc of Isovue 370. COMPARISON:  Prior MRI from earlier the same day. FINDINGS: CT HEAD Scalp soft tissues within normal limits. Globes and orbits unremarkable. Patient status post lens extraction bilaterally. Senescent calcifications noted. Visualized paranasal sinuses are clear. No mastoid effusion. Middle ear cavities are clear. Calvarium intact. Age-related  cerebral atrophy with moderate chronic  microvascular ischemic disease. Known acute lacunar infarct within the left internal capsule not well seen. No acute intracranial hemorrhage. No other acute large vessel territory infarct. No mass lesion, midline shift, or mass effect. No hydrocephalus. No extra-axial fluid collection. CTA NECK Aortic arch: Visualized aortic arch of normal caliber with normal branch pattern. No high-grade stenosis at the origin of the great vessels. Mild scattered plaque within the arch itself. Visualized subclavian arteries widely patent. Right carotid system: Right common carotid artery tortuous proximally. Right common carotid artery patent to the bifurcation without stenosis. No significant atheromatous plaque about the right bifurcation. Right ICA widely patent distally to the skullbase without stenosis, dissection, or occlusion. Right external carotid artery is branches within normal limits. Left carotid system: Left common carotid artery widely patent to the bifurcation. Minimal centric plaque about the left bifurcation without stenosis. Left ICA widely patent from the bifurcation to the skullbase without stenosis, dissection, or occlusion. Vertebral arteries:Both vertebral arteries arise from the subclavian arteries. Pre foraminal left V1 segment tortuous with multifocal atheromatous irregularity with resultant mild multi focal narrowing. Vertebral arteries otherwise widely patent within the neck without stenosis, dissection, or occlusion. Skeleton: No acute osseous abnormality. No worrisome lytic or blastic osseous lesions. Advanced degenerative spondylolysis noted within cervical spine. Other neck: Visualized lungs are clear. Visualized mediastinum grossly normal. Thyroid within normal limits. No adenopathy within the neck. No acute soft tissue abnormality. CTA HEAD Anterior circulation: Petrous segments widely patent bilaterally. Scattered calcified plaque within the cavernous ICAs with mild diffuse narrowing. A1 segments  patent. Anterior communicating artery normal. Anterior cerebral arteries well opacified to their distal aspects. M1 segments widely patent without stenosis or occlusion. MCA bifurcations normal. Focal short-segment severe stenosis proximal right M2 branch, middle division (series 15, image 63). Distal MCA branches well opacified and symmetric. Posterior circulation: Focal plaque at the right V4 segment as it crosses the dural margin with secondary minimal narrowing. Right vertebral artery otherwise widely patent to the vertebrobasilar junction. Minimal plaque within the left V4 segment without stenosis. Posterior inferior cerebral arteries patent. Basilar artery fenestrated proximally. Basilar artery widely patent to its distal aspect. Superior cerebral arteries patent. Both of the posterior cerebral arteries arise from the basilar artery. Atheromatous irregularity with moderate smooth narrowing of the left P 2 segment. More mild atheromatous irregularity within the right PCA. Venous sinuses: Patent without evidence for venous sinus thrombosis. Anatomic variants: No significant anatomic variant. No aneurysm or vascular malformation. Small focal outpouching arising from the expected location of the takeoff of the left posterior communicating artery favored to reflect a small vascular infundibulum is associated with a hypoplastic left P com. Delayed phase: No pathologic enhancement. IMPRESSION: CTA NECK IMPRESSION: 1. Negative CTA of the neck for patient age. No high-grade or critical stenosis. 2. Mild atheromatous plaque about the left carotid bifurcation without significant stenosis. 3. No significant atheromatous disease within the right carotid artery system. 4. Mild atheromatous plaque within the pre foraminal left V1 segment with resultant mild stenosis. Otherwise widely patent vertebral arteries within the neck. CTA HEAD IMPRESSION: 1. Negative for large vessel occlusion. No high-grade or correctable stenosis.  2. Short-segment severe nonocclusive stenosis proximal right M2 branch as above. 3. Calcified atheromatous plaque within the cavernous ICAs with mild diffuse narrowing. 4. Mild to moderate multifocal atheromatous irregularity within the PCAs bilaterally, left greater than right. Electronically Signed   By: Rise Mu M.D.   On: 02/02/2016 02:08   Mr Brain Ilda Basset  Contrast  Result Date: 02/01/2016 CLINICAL DATA:  Right-sided facial droop. Stroke like symptoms. Slurred speech. Symptoms began at 7:30 a.m. EXAM: MRI HEAD WITHOUT CONTRAST TECHNIQUE: Multiplanar, multiecho pulse sequences of the brain and surrounding structures were obtained without intravenous contrast. COMPARISON:  CT head without contrast 03/07/2013 FINDINGS: Brain: Acute nonhemorrhagic linear infarct is present within the posterior limb of the left internal capsule measuring up to 14 mm. No other acute infarct is present. Extensive dilated perivascular spaces are present throughout the basal ganglia and thalami. Advanced atrophy and diffuse white matter disease is present bilaterally. The ventricles are proportionate to the degree of atrophy. White matter changes extend into the brainstem. The brainstem and cerebellum are otherwise unremarkable. Internal auditory canals are within normal limits. Vascular: Flow is present in the major intracranial arteries. Skull and upper cervical spine: The skullbase is within normal limits. Midline sagittal images demonstrate an infarct involving the anterior corpus callosum. Midline intracranial structures are otherwise within normal limits. Mild degenerative changes are present in the upper cervical spine, and consistent with a age. Sinuses/Orbits: The paranasal sinuses and mastoid air cells are clear. Bilateral lens replacements are present. The globes and orbits are intact. Other: IMPRESSION: 1. Acute nonhemorrhagic 1.4 cm linear infarct within the posterior limb of the left internal capsule. This  corresponds with the patient's right-sided symptoms and a aphasia. 2. Advanced atrophy and diffuse white matter disease likely reflects the sequela of chronic microvascular ischemia without other acute infarct. 3. Dilated perivascular spaces throughout the basal ganglia. These results were called by telephone at the time of interpretation on 02/01/2016 at 3:58 pm to Dr. Rubin PayorPICKERING, who verbally acknowledged these results. Electronically Signed   By: Marin Robertshristopher  Mattern M.D.   On: 02/01/2016 16:01    Scheduled Meds: .  stroke: mapping our early stages of recovery book   Does not apply Once  . aspirin  300 mg Rectal Daily   Or  . aspirin  325 mg Oral Daily  . heparin  5,000 Units Subcutaneous Q8H  . oxybutynin  5 mg Oral QHS  . pantoprazole  40 mg Oral Daily  . simvastatin  40 mg Oral q1800  . topiramate  25 mg Oral BID   Continuous Infusions:    LOS: 1 day   Time spent: 25 minutes.  Hazeline Junkeryan Madalynne Gutmann, MD [facility] Pager [phone]  If 7PM-7AM, please contact night-coverage [email or web address] Password [email or web address] 02/02/2016, 2:13 PM

## 2016-02-02 NOTE — Evaluation (Signed)
Clinical/Bedside Swallow Evaluation Patient Details  Name: Kelly [name] MRN: [id number] Date of Birth: 05/26/1927  Today's Date: 02/02/2016 Time: SLP Start Time (ACUTE ONLY): 0840 SLP Stop Time (ACUTE ONLY): 0900 SLP Time Calculation (min) (ACUTE ONLY): 20 min  Past Medical History:  Past Medical History:  Diagnosis Date  . Dysphonia 02/20/2013  . Essential and other specified forms of tremor 02/20/2013  . Essential tremor   . Osteoporosis   . Psoriasis    Past Surgical History:  Past Surgical History:  Procedure Laterality Date  . APPENDECTOMY  1940  . bladder tack    . CATARACT EXTRACTION Bilateral   . HEMORRHOID SURGERY  1960  . HUMERUS FRACTURE SURGERY    . LAPAROSCOPIC HYSTERECTOMY  2006  . torn rotator cuff    . WRIST FRACTURE SURGERY  2005   seconday shoulder 2001   HPI:  Kelly [name] is a 82 y.o. female with medical history significant for essential tremor, psoriasis and osteoporosis,, presenting with acute onset f R facial droop since 7:30 without any other focal abnormalities. Patient  never had a similar episode.Denis any history of TIA. Denies vertigo dizziness or vision changes. Denies headaches. No dysphagia. No confusion. No seizures. No syncope or presyncope, no falls.  Denies any chest pain, or shortness of breath. Denies any fever or chills, or night sweats. Does not smoke. No new  hormonal supplements. She has recently purchased "CBD" as antinflammatory OTC product to help treat arthritic pain. No syncope or presyncope. Does not take ASA although she took one today due to symptoms. No other antiplatelets or anticoagulants. Patient is compliant with his medications. Last  long distance trip was to [place] 3 weeks ago. No recent surgeries. No sick contacts. No new stressors present in personal life. Patient is very active. She is not a diabetic. No family history of stroke. No tobacco or ETOH or recreational drugs. Last physical was on July of 2017, without any  other abnormalities.  Patient was not administered TPA  as is beyond time window for treatment consideration. Will admit for further evaluation and treatment.   Assessment / Plan / Recommendation Clinical Impression  Patient presents with moderate oral pharyngeal dysphagia. Pt has right sided facial droop with reduced sensation and coordination effecting her swallow. She was given ice chips, thin liquid, nectar thick liquids, apple sauce and cracker. Pt had an immedicate cough with thin liquid given by spoon. Pt had no signs or symptoms of aspiration with all other consistencies. Recommend a Dys 3 diet with nectar thickened liquids with no straws. Pt may have meds whole in puree. Communicated with RN recommendation and education completed with patient at bedside.     Aspiration Risk  Moderate aspiration risk    Diet Recommendation Dysphagia 3 (Mech soft);Nectar-thick liquid   Liquid Administration via: Cup;No straw Medication Administration: Whole meds with puree Compensations: Slow rate;Small sips/bites;Lingual sweep for clearance of pocketing Postural Changes: Seated upright at 90 degrees;Remain upright for at least 30 minutes after po intake    Other  Recommendations Oral Care Recommendations: Oral care BID   Follow up Recommendations  Home health SLP    Frequency and Duration min 2x/week          Prognosis Prognosis for Safe Diet Advancement: Good      Swallow Study   General Date of Onset: 02/01/16 HPI: Kelly [name] is a 82 y.o. female with medical history significant for essential tremor, psoriasis and osteoporosis,, presenting with acute onset f R  facial droop since 7:30 without any other focal abnormalities. Patient  never had a similar episode.Denis any history of TIA. Denies vertigo dizziness or vision changes. Denies headaches. No dysphagia. No confusion. No seizures. No syncope or presyncope, no falls.  Denies any chest pain, or shortness of breath. Denies any fever or  chills, or night sweats. Does not smoke. No new  hormonal supplements. She has recently purchased "CBD" as antinflammatory OTC product to help treat arthritic pain. No syncope or presyncope. Does not take ASA although she took one today due to symptoms. No other antiplatelets or anticoagulants. Patient is compliant with his medications. Last  long distance trip was [place] 3 [place] ago. No recent surgeries. No sick contacts. No new stressors present in personal life. Patient is very active. She is not a diabetic. No family history of stroke. No tobacco or ETOH or recreational drugs. Last physical was on July of 2017, without any other abnormalities.  Patient was not administered TPA  as is beyond time window for treatment consideration. Will admit for further evaluation and treatment. Type of Study: Bedside Swallow Evaluation Diet Prior to this Study: NPO Temperature Spikes Noted: No Respiratory Status: Room air History of Recent Intubation: No Behavior/Cognition: Alert;Cooperative;Pleasant mood Oral Cavity Assessment: Dry Oral Care Completed by SLP: No Oral Cavity - Dentition: Adequate natural dentition Vision: Functional for self-feeding Self-Feeding Abilities: Able to feed self;Needs set up Patient Positioning: Upright in bed Baseline Vocal Quality: Normal Volitional Cough: Strong Volitional Swallow: Able to elicit    Oral/Motor/Sensory Function Overall Oral Motor/Sensory Function: Moderate impairment Facial ROM: Reduced right Facial Symmetry: Abnormal symmetry right Facial Strength: Reduced right Facial Sensation: Reduced right Lingual ROM: Reduced right Lingual Symmetry: Abnormal symmetry right Lingual Strength: Reduced Lingual Sensation: Reduced   Ice Chips Ice chips: Within functional limits Presentation: Spoon   Thin Liquid Thin Liquid: Impaired Presentation: Spoon Oral Phase Impairments: Reduced lingual movement/coordination;Poor awareness of bolus Oral Phase Functional  Implications: Prolonged oral transit Pharyngeal  Phase Impairments: Suspected delayed Swallow;Cough - Immediate    Nectar Thick Nectar Thick Liquid: Within functional limits Presentation: Spoon;Cup   Honey Thick Honey Thick Liquid: Not tested   Puree Puree: Within functional limits Presentation: Self Fed;Spoon   Solid   GO   Solid: Within functional limits Presentation: Self Fed;Spoon    Functional Assessment Tool Used: Bedside Swallow Evaluation Functional Limitations: Swallowing Swallow Current Status ([id number](G8996): At least 20 percent but less than 40 percent impaired, limited or restricted Swallow Goal Status [phone](G8997): At least 1 percent but less than 20 percent impaired, limited or restricted  Lindalou HoseSarah J. Audrick Lamoureaux, MA, CCC-SLP 02/02/2016 9:28 AM

## 2016-02-02 NOTE — Progress Notes (Signed)
STROKE TEAM PROGRESS NOTE   HISTORY OF PRESENT ILLNESS (per record) Kelly [name] is a 82 y.o. female who awoke this morning normal at 6 AM. She then began feeling unwell half hour or so later lay back down awakening at 7:30 noticing right facial weakness. She states that she also noticed slurring. She is not sure if she notices much weakness on her right side, but notes that she has chronic problems with both legs, more so since her recent fall.   LKW: 6:30 AM tpa given?: no, out of window   SUBJECTIVE (INTERVAL HISTORY) Her daughter and son and husband are at the bedside.  Overall she feels her condition is a little bit worse and then yesterday. She has significant right facial droop, right upper extremity weakness. Recommended CIR.  OBJECTIVE Temp:  [98.2 F (36.8 C)-99 F (37.2 C)] 98.2 F (36.8 C) (09/10 1410) Pulse Rate:  [58-75] 66 (09/10 1410) Cardiac Rhythm: Normal sinus rhythm;Heart block (09/09 1919) Resp:  [15-20] 20 (09/10 1410) BP: (103-154)/(49-86) 123/63 (09/10 1410) SpO2:  [95 %-99 %] 98 % (09/10 1410)  CBC:   Recent Labs Lab 02/01/16 1418 02/01/16 1443  WBC 11.5*  --   NEUTROABS 8.5*  --   HGB 12.4 12.9  HCT 39.0 38.0  MCV 90.1  --   PLT 285  --     Basic Metabolic Panel:   Recent Labs Lab 02/01/16 1418 02/01/16 1443  NA 138 141  K 4.1 4.2  CL 109 106  CO2 24  --   GLUCOSE 112* 111*  BUN 22* 24*  CREATININE 0.70 0.70  CALCIUM 9.0  --     Lipid Panel:     Component Value Date/Time   CHOL 162 02/02/2016 0745   TRIG 75 02/02/2016 0745   HDL 40 (L) 02/02/2016 0745   CHOLHDL 4.1 02/02/2016 0745   VLDL 15 02/02/2016 0745   LDLCALC 107 (H) 02/02/2016 0745   HgbA1c: No results found for: HGBA1C Urine Drug Screen:     Component Value Date/Time   LABOPIA NONE DETECTED 02/01/2016 1452   COCAINSCRNUR NONE DETECTED 02/01/2016 1452   LABBENZ NONE DETECTED 02/01/2016 1452   AMPHETMU NONE DETECTED 02/01/2016 1452   THCU NONE DETECTED  02/01/2016 1452   LABBARB NONE DETECTED 02/01/2016 1452      IMAGING I have personally reviewed the radiological images below and agree with the radiology interpretations.  Ct Angio Head and Neck W Or Wo Contrast 02/02/2016  CTA NECK IMPRESSION:  1. Negative CTA of the neck for patient age. No high-grade or critical stenosis.  2. Mild atheromatous plaque about the left carotid bifurcation without significant stenosis.  3. No significant atheromatous disease within the right carotid artery system.  4. Mild atheromatous plaque within the pre foraminal left V1 segment with resultant mild stenosis. Otherwise widely patent vertebral arteries within the neck.   CTA HEAD IMPRESSION:  1. Negative for large vessel occlusion. No high-grade or correctable stenosis.  2. Short-segment severe nonocclusive stenosis proximal right M2 branch as above.  3. Calcified atheromatous plaque within the cavernous ICAs with mild diffuse narrowing.  4. Mild to moderate multifocal atheromatous irregularity within the PCAs bilaterally, left greater than right.   Dg Chest 2 View 02/01/2016 No active cardiopulmonary disease.   Mr Brain Wo Contrast 02/01/2016 1. Acute nonhemorrhagic 1.4 cm linear infarct within the posterior limb of the left internal capsule. This corresponds with the patient's right-sided symptoms and a aphasia.  2. Advanced atrophy and diffuse  white matter disease likely reflects the sequela of chronic microvascular ischemia without other acute infarct.  3. Dilated perivascular spaces throughout the basal ganglia.   2-D Echo pending   PHYSICAL EXAM  Temp:  [98.2 F (36.8 C)-99 F (37.2 C)] 98.2 F (36.8 C) (09/10 1410) Pulse Rate:  [58-75] 66 (09/10 1410) Resp:  [15-20] 20 (09/10 1410) BP: (103-154)/(49-86) 123/63 (09/10 1410) SpO2:  [95 %-99 %] 98 % (09/10 1410)  General - Well nourished, well developed, in no apparent distress.  Ophthalmologic - Fundi not visualized due to eye  movement.  Cardiovascular - Regular rate and rhythm.  Mental Status -  Level of arousal and orientation to time, place, and person were intact. Language including expression, naming, repetition, comprehension was assessed and found intact, however moderate dysarthria Fund of Knowledge was assessed and was intact.  Cranial Nerves II - XII - II - Visual field intact OU. III, IV, VI - Extraocular movements intact. V - Facial sensation intact bilaterally. VII - right facial droop. Mild weakness of right eye closure VIII - Hearing & vestibular intact bilaterally. X - Palate elevates symmetrically. XI - Chin turning & shoulder shrug intact bilaterally. XII - tongue protrusion to the right.  Motor Strength - The patient's strength was RUE 4/5 proximal, and 3/5 distal, RLE 4+/5 proximal and distal, 5/5 LUE and LLE.  Bulk was normal and fasciculations were absent.   Motor Tone - Muscle tone was assessed at the neck and appendages and was normal.  Reflexes - The patient's reflexes were 1+ in all extremities and she had no pathological reflexes.  Sensory - Light touch, temperature/pinprick were assessed and were symmetrical.    Coordination - The patient had normal movements in the hands with no ataxia or dysmetria.  Tremor was absent.  Gait and Station -  not tested due to safety concerns .   ASSESSMENT/PLAN Ms. Kelly HumphreysBennie H [name] is a 82 y.o. female with history of psoriasis, osteoporosis, and essential tremor, presenting with right facial weakness. She did not receive IV t-PA due to late presentation.  Stroke: left PLIC lacunar infarct secondary to small vessel disease.  Resultant  Right facial droop, right UE weakness  MRI acute lacunar infarct within the posterior limb of the left internal capsule.    CTA Head & Neck - negative  2D Echo - pending  LDL - 107  HgbA1c pending  VTE prophylaxis - subcutaneous heparin DIET DYS 3 Room service appropriate? Yes; Fluid consistency:  Nectar Thick  No antithrombotic prior to admission, now on aspirin 325 mg daily. Continue aspirin on discharge   Patient counseled to be compliant with her antithrombotic medications  Ongoing aggressive stroke risk factor management  Therapy recommendations: - CIR recommended  Disposition: Pending  Hypertension  Stable  Permissive hypertension (OK if < 220/120) but gradually normalize in 5-7 days  Long-term BP goal normotensive  Hyperlipidemia  Home meds:  No lipid lowering medications prior to admission  LDL 107, goal < 70  Now on Lipitor 40 mg daily  Continue statin at discharge  Other Stroke Risk Factors  Advanced age  ETOH use, advised to drink no more than 1 - 2 drink(s) a day  Other Active Problems  ET - on topamax as per family  Hospital day # 1  Neurology will sign off. Please call with questions. [facility]. Thanks for the consult.  Marvel PlanJindong Nathanal Hermiz, MD PhD Stroke Neurology 02/02/2016 5:40 PM  To contact Stroke Continuity provider, please refer to [email or web address] After hours, contact General Neurology

## 2016-02-02 NOTE — Evaluation (Signed)
Occupational Therapy Evaluation Patient Details Name: Kelly [name] MRN: [id number] DOB: 1927/10/30 Today's Date: 02/02/2016    History of Present Illness 82 y.o.femalewith medical history significant for essential tremor, psoriasis and osteoporosis presenting with acute onset of R facial droop. MRI on 9/9 + for acute infarct within the posterior limb of the L internal capsule.    Clinical Impression   Pt reports she was very independent with ADL PTA. Currently pt overall mod assist for basic transfers. Pt presenting with decreased RUE strength and coordination and impaired sitting/standing balance impacting her independence and safety with ADL and functional mobility at this time. Pt attempting to self feed upon arrival; set pt up in chair with red foam and encouraged functional use of RUE; pt able to demo taking bites of applesauce with built up utensil and use of R hand. Pt is very motivated to participate in therapy and eager to return to functional independence. Recommending CIR level therapies to maximize pts independence and safety with ADL and functional mobility prior to return home. Pt would benefit from continued skilled OT to address established goals.  Of note: upon sitting up EOB, pt noted to be loosing food/liquid from R side of mouth. At that point she had not taken a bite of food in ~5 minutes. RN and SLP notified.     Follow Up Recommendations  CIR;Supervision/Assistance - 24 hour    Equipment Recommendations  Other (comment) (TBD)    Recommendations for Other Services Rehab consult;PT consult     Precautions / Restrictions Precautions Precautions: Fall Restrictions Weight Bearing Restrictions: No      Mobility Bed Mobility Overal bed mobility: Needs Assistance Bed Mobility: Supine to Sit     Supine to sit: Min assist;HOB elevated     General bed mobility comments: HOB elevated with use of bed rail. Assist at trunk to come into full upright sitting  position.  Transfers Overall transfer level: Needs assistance Equipment used: Rolling walker (2 wheeled) Transfers: Sit to/from UGI CorporationStand;Stand Pivot Transfers Sit to Stand: Min assist Stand pivot transfers: Mod assist       General transfer comment: Cues for hand placement, technique, and sequencing.    Balance Overall balance assessment: Needs assistance Sitting-balance support: Feet supported;Bilateral upper extremity supported Sitting balance-Leahy Scale: Poor Sitting balance - Comments: Pt with R lateral lean in sitting. Able to self correct with verbal cues. Postural control: Right lateral lean Standing balance support: Bilateral upper extremity supported Standing balance-Leahy Scale: Poor Standing balance comment: Requires RW and physical assist to maintain standing balance.                            ADL Overall ADL's : Needs assistance/impaired Eating/Feeding: Set up;Sitting;With adaptive utensils Eating/Feeding Details (indicate cue type and reason): Set pt up with red foam for increased independence with self feeding. Encouraged use of R hand if possible. Pt able to demo small bites of applesauce using build up handle on spoon. Sitting supported in chair. Grooming: Minimal assistance;Sitting   Upper Body Bathing: Minimal assitance;Sitting   Lower Body Bathing: Maximal assistance;Sit to/from stand   Upper Body Dressing : Minimal assistance;Sitting Upper Body Dressing Details (indicate cue type and reason): Assist to guide RUE into hospital gown. Lower Body Dressing: Maximal assistance;Sit to/from stand   Toilet Transfer: Moderate assistance;Stand-pivot;BSC;RW Toilet Transfer Details (indicate cue type and reason): Simulated by stand pivot from EOB to chair Toileting- Clothing Manipulation and Hygiene: Maximal assistance;Sit to/from stand  Functional mobility during ADLs: Moderate assistance;Rolling walker (for stand pivot only) General ADL Comments:  Encouraged functional use of RUE as much as possible. Discussed post acute rehab and pt is agreeable; very motivated to return to functional independence. Upon sitting EOB; pt noted to be loosing food/liquid from R side of mouth (RN and SLP notified).      Vision Additional Comments: Appears WFL.   Perception     Praxis      Pertinent Vitals/Pain Pain Assessment: No/denies pain     Hand Dominance Right   Extremity/Trunk Assessment Upper Extremity Assessment Upper Extremity Assessment: RUE deficits/detail RUE Deficits / Details: Full AROM. Strength grossly 4-/5. Poor fine/gross motor coordination. Pt reports numbness distally. RUE Sensation: decreased light touch RUE Coordination: decreased gross motor;decreased fine motor   Lower Extremity Assessment Lower Extremity Assessment: Defer to PT evaluation   Cervical / Trunk Assessment Cervical / Trunk Assessment: Kyphotic   Communication Communication Communication: Expressive difficulties (slurred speech, mild word finding difficulties)   Cognition Arousal/Alertness: Awake/alert Behavior During Therapy: WFL for tasks assessed/performed Overall Cognitive Status: Within Functional Limits for tasks assessed                     General Comments       Exercises       Shoulder Instructions      Home Living Family/patient expects to be discharged to:: Private residence Living Arrangements: Spouse/significant other Available Help at Discharge: Family;Available 24 hours/day Type of Home: House Home Access: Stairs to enter [employer] of Steps: 2   Home Layout: Two level;Able to live on main level with bedroom/bathroom     Bathroom Shower/Tub: Tub/shower unit Shower/tub characteristics: Curtain Firefighter: Standard     Home Equipment: Environmental consultant - 4 wheels          Prior Functioning/Environment Level of Independence: Independent with assistive device(s)        Comments: Pt had a fall a few  weeks ago while she was out dancing with her husband; since then she has been using a rollator for all mobility. Prior to fall pt was independent with mobility and ADL with use of AD. Has housekeeper that does cleaning every other week.    OT Diagnosis: Generalized weakness;Hemiplegia dominant side   OT Problem List: Decreased strength;Impaired balance (sitting and/or standing);Decreased coordination;Decreased knowledge of use of DME or AE;Decreased knowledge of precautions;Impaired sensation;Impaired UE functional use   OT Treatment/Interventions: Self-care/ADL training;Therapeutic exercise;Neuromuscular education;Energy conservation;DME and/or AE instruction;Therapeutic activities;Patient/family education;Balance training    OT Goals(Current goals can be found in the care plan section) Acute Rehab OT Goals Patient Stated Goal: get better OT Goal Formulation: With patient Time For Goal Achievement: 02/16/16 Potential to Achieve Goals: Good ADL Goals Pt Will Perform Eating: with adaptive utensils;sitting;with modified independence Pt Will Perform Grooming: with min guard assist;with adaptive equipment;standing Pt Will Perform Upper Body Bathing: sitting;with supervision Pt Will Perform Lower Body Bathing: with min guard assist;sit to/from stand Pt Will Transfer to Toilet: with min guard assist;ambulating;bedside commode Pt Will Perform Toileting - Clothing Manipulation and hygiene: with min guard assist;sit to/from stand Pt/caregiver will Perform Home Exercise Program: Increased strength;Right Upper extremity;With theraputty;With Supervision;With written HEP provided (increase fine motor coordination)  OT Frequency: Min 3X/week   Barriers to D/C:            Co-evaluation              End of Session Equipment Utilized During Treatment: Engineer, water Communication:  Mobility status;Other (comment) (pt up in chair with chair alarm on)  Activity Tolerance: Patient tolerated  treatment well Patient left: in chair;with call bell/phone within reach;with chair alarm set   Time: 9604-5409 OT Time Calculation (min): 25 min Charges:  OT General Charges $OT Visit: 1 Procedure OT Evaluation $OT Eval Moderate Complexity: 1 Procedure OT Treatments $Self Care/Home Management : 8-22 mins G-Codes:     Gaye Alken M.S., OTR/L Pager: [phone]  02/02/2016, 9:57 AM

## 2016-02-02 NOTE — Progress Notes (Signed)
OT Cancellation Note  Patient Details Name: Kelly [name] MRN: [id number] DOB: 02-24-1928   Cancelled Treatment:    Reason Eval/Treat Not Completed: Patient at procedure or test/ unavailable (with SLP). Will follow up as time allows.  Gaye AlkenBailey A Bilaal Leib M.S., OTR/L Pager: [phone]  02/02/2016, 9:07 AM

## 2016-02-03 ENCOUNTER — Inpatient Hospital Stay (HOSPITAL_COMMUNITY)
Admission: RE | Admit: 2016-02-03 | Discharge: 2016-02-21 | DRG: 057 | Disposition: A | Discharge status: Skilled Nursing Facility | Payer: Medicare Other | Source: Intra-hospital | Attending: Physical Medicine & Rehabilitation | Admitting: Physical Medicine & Rehabilitation

## 2016-02-03 ENCOUNTER — Inpatient Hospital Stay (HOSPITAL_COMMUNITY): Payer: Medicare Other

## 2016-02-03 ENCOUNTER — Other Ambulatory Visit (HOSPITAL_COMMUNITY): Payer: Medicare Other

## 2016-02-03 ENCOUNTER — Encounter (HOSPITAL_COMMUNITY): Payer: Self-pay

## 2016-02-03 DIAGNOSIS — I63312 Cerebral infarction due to thrombosis of left middle cerebral artery: Secondary | ICD-10-CM

## 2016-02-03 DIAGNOSIS — N3281 Overactive bladder: Secondary | ICD-10-CM | POA: Diagnosis present

## 2016-02-03 DIAGNOSIS — Z5189 Encounter for other specified aftercare: Secondary | ICD-10-CM | POA: Diagnosis not present

## 2016-02-03 DIAGNOSIS — E785 Hyperlipidemia, unspecified: Secondary | ICD-10-CM | POA: Diagnosis present

## 2016-02-03 DIAGNOSIS — K5901 Slow transit constipation: Secondary | ICD-10-CM | POA: Diagnosis not present

## 2016-02-03 DIAGNOSIS — I69322 Dysarthria following cerebral infarction: Secondary | ICD-10-CM | POA: Diagnosis not present

## 2016-02-03 DIAGNOSIS — D62 Acute posthemorrhagic anemia: Secondary | ICD-10-CM | POA: Diagnosis present

## 2016-02-03 DIAGNOSIS — I5030 Unspecified diastolic (congestive) heart failure: Secondary | ICD-10-CM | POA: Diagnosis present

## 2016-02-03 DIAGNOSIS — I69922 Dysarthria following unspecified cerebrovascular disease: Secondary | ICD-10-CM

## 2016-02-03 DIAGNOSIS — R471 Dysarthria and anarthria: Secondary | ICD-10-CM | POA: Diagnosis present

## 2016-02-03 DIAGNOSIS — R1312 Dysphagia, oropharyngeal phase: Secondary | ICD-10-CM | POA: Diagnosis not present

## 2016-02-03 DIAGNOSIS — G250 Essential tremor: Secondary | ICD-10-CM

## 2016-02-03 DIAGNOSIS — Z9841 Cataract extraction status, right eye: Secondary | ICD-10-CM

## 2016-02-03 DIAGNOSIS — N318 Other neuromuscular dysfunction of bladder: Secondary | ICD-10-CM

## 2016-02-03 DIAGNOSIS — R1319 Other dysphagia: Secondary | ICD-10-CM | POA: Diagnosis not present

## 2016-02-03 DIAGNOSIS — I69121 Dysphasia following nontraumatic intracerebral hemorrhage: Secondary | ICD-10-CM | POA: Diagnosis not present

## 2016-02-03 DIAGNOSIS — Z23 Encounter for immunization: Secondary | ICD-10-CM | POA: Diagnosis not present

## 2016-02-03 DIAGNOSIS — R531 Weakness: Secondary | ICD-10-CM | POA: Diagnosis not present

## 2016-02-03 DIAGNOSIS — I110 Hypertensive heart disease with heart failure: Secondary | ICD-10-CM | POA: Diagnosis present

## 2016-02-03 DIAGNOSIS — R262 Difficulty in walking, not elsewhere classified: Secondary | ICD-10-CM | POA: Diagnosis not present

## 2016-02-03 DIAGNOSIS — I639 Cerebral infarction, unspecified: Secondary | ICD-10-CM

## 2016-02-03 DIAGNOSIS — F4321 Adjustment disorder with depressed mood: Secondary | ICD-10-CM | POA: Diagnosis present

## 2016-02-03 DIAGNOSIS — I69992 Facial weakness following unspecified cerebrovascular disease: Secondary | ICD-10-CM

## 2016-02-03 DIAGNOSIS — K5900 Constipation, unspecified: Secondary | ICD-10-CM | POA: Diagnosis not present

## 2016-02-03 DIAGNOSIS — I69351 Hemiplegia and hemiparesis following cerebral infarction affecting right dominant side: Secondary | ICD-10-CM | POA: Diagnosis not present

## 2016-02-03 DIAGNOSIS — I69951 Hemiplegia and hemiparesis following unspecified cerebrovascular disease affecting right dominant side: Secondary | ICD-10-CM | POA: Diagnosis not present

## 2016-02-03 DIAGNOSIS — I63311 Cerebral infarction due to thrombosis of right middle cerebral artery: Secondary | ICD-10-CM

## 2016-02-03 DIAGNOSIS — R1310 Dysphagia, unspecified: Secondary | ICD-10-CM | POA: Diagnosis present

## 2016-02-03 DIAGNOSIS — G252 Other specified forms of tremor: Secondary | ICD-10-CM

## 2016-02-03 DIAGNOSIS — Z7983 Long term (current) use of bisphosphonates: Secondary | ICD-10-CM

## 2016-02-03 DIAGNOSIS — D72829 Elevated white blood cell count, unspecified: Secondary | ICD-10-CM | POA: Diagnosis present

## 2016-02-03 DIAGNOSIS — R488 Other symbolic dysfunctions: Secondary | ICD-10-CM | POA: Diagnosis not present

## 2016-02-03 DIAGNOSIS — R260 Ataxic gait: Secondary | ICD-10-CM | POA: Diagnosis not present

## 2016-02-03 DIAGNOSIS — R7303 Prediabetes: Secondary | ICD-10-CM

## 2016-02-03 DIAGNOSIS — I519 Heart disease, unspecified: Secondary | ICD-10-CM | POA: Diagnosis not present

## 2016-02-03 DIAGNOSIS — M6281 Muscle weakness (generalized): Secondary | ICD-10-CM | POA: Diagnosis not present

## 2016-02-03 DIAGNOSIS — R2681 Unsteadiness on feet: Secondary | ICD-10-CM | POA: Diagnosis not present

## 2016-02-03 DIAGNOSIS — I6789 Other cerebrovascular disease: Secondary | ICD-10-CM

## 2016-02-03 DIAGNOSIS — Z9842 Cataract extraction status, left eye: Secondary | ICD-10-CM

## 2016-02-03 DIAGNOSIS — I69391 Dysphagia following cerebral infarction: Secondary | ICD-10-CM

## 2016-02-03 DIAGNOSIS — R7309 Other abnormal glucose: Secondary | ICD-10-CM

## 2016-02-03 DIAGNOSIS — G8191 Hemiplegia, unspecified affecting right dominant side: Secondary | ICD-10-CM

## 2016-02-03 DIAGNOSIS — G8111 Spastic hemiplegia affecting right dominant side: Secondary | ICD-10-CM | POA: Diagnosis not present

## 2016-02-03 DIAGNOSIS — Z79899 Other long term (current) drug therapy: Secondary | ICD-10-CM

## 2016-02-03 DIAGNOSIS — I69392 Facial weakness following cerebral infarction: Secondary | ICD-10-CM

## 2016-02-03 DIAGNOSIS — I6330 Cerebral infarction due to thrombosis of unspecified cerebral artery: Secondary | ICD-10-CM

## 2016-02-03 DIAGNOSIS — I158 Other secondary hypertension: Secondary | ICD-10-CM

## 2016-02-03 DIAGNOSIS — F329 Major depressive disorder, single episode, unspecified: Secondary | ICD-10-CM | POA: Diagnosis not present

## 2016-02-03 DIAGNOSIS — I69991 Dysphagia following unspecified cerebrovascular disease: Secondary | ICD-10-CM

## 2016-02-03 DIAGNOSIS — I10 Essential (primary) hypertension: Secondary | ICD-10-CM

## 2016-02-03 DIAGNOSIS — I6350 Cerebral infarction due to unspecified occlusion or stenosis of unspecified cerebral artery: Secondary | ICD-10-CM

## 2016-02-03 DIAGNOSIS — K219 Gastro-esophageal reflux disease without esophagitis: Secondary | ICD-10-CM | POA: Diagnosis not present

## 2016-02-03 DIAGNOSIS — I69051 Hemiplegia and hemiparesis following nontraumatic subarachnoid hemorrhage affecting right dominant side: Secondary | ICD-10-CM | POA: Diagnosis not present

## 2016-02-03 DIAGNOSIS — R278 Other lack of coordination: Secondary | ICD-10-CM | POA: Diagnosis not present

## 2016-02-03 DIAGNOSIS — I5189 Other ill-defined heart diseases: Secondary | ICD-10-CM

## 2016-02-03 DIAGNOSIS — G8190 Hemiplegia, unspecified affecting unspecified side: Secondary | ICD-10-CM

## 2016-02-03 HISTORY — DX: Cerebral infarction, unspecified: I63.9

## 2016-02-03 LAB — HEMOGLOBIN A1C
Hgb A1c MFr Bld: 5.7 % — ABNORMAL HIGH (ref 4.8–5.6)
Mean Plasma Glucose: 117 mg/dL

## 2016-02-03 LAB — ECHOCARDIOGRAM COMPLETE
Height: 60 in
Weight: 1840 oz

## 2016-02-03 MED ORDER — ASPIRIN 325 MG PO TABS
325.0000 mg | ORAL_TABLET | Freq: Every day | ORAL | Status: DC
  Administered 2016-02-04 – 2016-02-21 (×18): 325 mg via ORAL
  Filled 2016-02-03 (×18): qty 1

## 2016-02-03 MED ORDER — SENNOSIDES-DOCUSATE SODIUM 8.6-50 MG PO TABS: 1.0000 | ORAL_TABLET | Freq: Every evening | ORAL | Status: DC | PRN

## 2016-02-03 MED ORDER — HEPARIN SODIUM (PORCINE) 5000 UNIT/ML IJ SOLN: 5000.0000 [IU] | Freq: Three times a day (TID) | INTRAMUSCULAR | Status: DC

## 2016-02-03 MED ORDER — ONDANSETRON HCL 4 MG PO TABS: 4.0000 mg | ORAL_TABLET | Freq: Four times a day (QID) | ORAL | Status: DC | PRN

## 2016-02-03 MED ORDER — PANTOPRAZOLE SODIUM 40 MG PO TBEC
40.0000 mg | DELAYED_RELEASE_TABLET | Freq: Every day | ORAL | Status: DC
  Administered 2016-02-04 – 2016-02-21 (×18): 40 mg via ORAL
  Filled 2016-02-03 (×18): qty 1

## 2016-02-03 MED ORDER — ACETAMINOPHEN 650 MG RE SUPP: 650.0000 mg | RECTAL | Status: DC | PRN

## 2016-02-03 MED ORDER — ASPIRIN 325 MG PO TABS
325.0000 mg | ORAL_TABLET | Freq: Every day | ORAL | Status: DC
Start: 2016-02-03 — End: 2017-04-05

## 2016-02-03 MED ORDER — ATORVASTATIN CALCIUM 40 MG PO TABS: 40.0000 mg | ORAL_TABLET | Freq: Every day | ORAL | Status: DC

## 2016-02-03 MED ORDER — TOPIRAMATE 25 MG PO TABS
25.0000 mg | ORAL_TABLET | Freq: Two times a day (BID) | ORAL | Status: DC
  Administered 2016-02-03 – 2016-02-21 (×36): 25 mg via ORAL
  Filled 2016-02-03 (×37): qty 1

## 2016-02-03 MED ORDER — HEPARIN SODIUM (PORCINE) 5000 UNIT/ML IJ SOLN
5000.0000 [IU] | Freq: Three times a day (TID) | INTRAMUSCULAR | Status: DC
  Administered 2016-02-03 – 2016-02-21 (×52): 5000 [IU] via SUBCUTANEOUS
  Filled 2016-02-03 (×52): qty 1

## 2016-02-03 MED ORDER — SORBITOL 70 % SOLN
30.0000 mL | Freq: Every day | Status: DC | PRN
  Administered 2016-02-10 – 2016-02-17 (×2): 30 mL via ORAL
  Filled 2016-02-03 (×2): qty 30

## 2016-02-03 MED ORDER — ACETAMINOPHEN 325 MG PO TABS: 650.0000 mg | ORAL_TABLET | ORAL | Status: DC | PRN

## 2016-02-03 MED ORDER — ASPIRIN 300 MG RE SUPP
300.0000 mg | Freq: Every day | RECTAL | Status: DC
  Filled 2016-02-03 (×2): qty 1

## 2016-02-03 MED ORDER — OXYBUTYNIN CHLORIDE 5 MG PO TABS
5.0000 mg | ORAL_TABLET | Freq: Every day | ORAL | Status: DC
  Administered 2016-02-03 – 2016-02-20 (×18): 5 mg via ORAL
  Filled 2016-02-03 (×18): qty 1

## 2016-02-03 MED ORDER — ATORVASTATIN CALCIUM 40 MG PO TABS
40.0000 mg | ORAL_TABLET | Freq: Every day | ORAL | Status: DC
  Administered 2016-02-04 – 2016-02-20 (×17): 40 mg via ORAL
  Filled 2016-02-03 (×16): qty 1

## 2016-02-03 MED ORDER — ONDANSETRON HCL 4 MG/2ML IJ SOLN: 4.0000 mg | Freq: Four times a day (QID) | INTRAMUSCULAR | Status: DC | PRN

## 2016-02-03 NOTE — Discharge Summary (Signed)
Physician Discharge Summary  Dan HumphreysBennie H Mcnease ZOX:[id number] DOB: [birth year] DOA: 02/01/2016  PCP: Gaspar Garbeichard W Tisovec, MD  Admit date: 02/01/2016 Discharge date: 02/03/2016  Admitted From: Home Disposition: CIR   Recommendations for Outpatient Follow-up:  1. Follow up with neurology in 2 months 2. Follow up with PCP following CIR 3. Patient started on aspirin 325mg  and lipitor 40mg  during hospitalization  Home Health: N/A Equipment/Devices: Per CIR  Discharge Condition: Stable CODE STATUS: Full Diet recommendation: Heart healthy  Brief/Interim Summary: Dorri H Rallsis a essentially healthy 82 y.o. female who [facility] after noticing acute right facial weakness and slurred speech. MRI showed acute nonhemorrhagic infarct of the left posterior limb of the internal capsule. She was outside the window for tPA and was admitted for further work up. Aspirin was started per neurology consultant. Echocardiogram was technically difficult but showed no signs of thromboembolic source. CTA of the head and neck was negative. She has received therapy for ongoing right hemiparesis/facial droop with dysphasia and dysphagia. She will continue aspirin 325mg  daily and lipitor 40mg , and will continue rehabilitation at CIR.   Discharge Diagnoses:  Active Problems:   Stroke (cerebrum) (HCC)   Facial droop due to stroke   Hyperlipidemia   Essential hypertension   Essential tremor   Dysarthria, post-stroke   Dysphagia, post-stroke   Leukocytosis   Prediabetes   Right hemiparesis (HCC)  Acute nonhemorrhagic infarct of the posterior limb of left internal capsule: With resultant right facial droop, dysphasia, and dysphagia. Age is only major risk factor.  - Admitted to neuro floor on telemetry, neurology consultation appreciated.  - Secondary stroke prevention: Started ASA 325mg  (none prior) - Cardiac monitoring: No events - CTA head/neck: Essentially negative neck, and no large vessel occlusion or  high-grade stenosis. - 2D echocardiogram: Technically difficult study with no WMA seen, no mention of ejection fraction, and some diastolic dysfunction. No thrombogenic focus was mentioned.   - PT/OT/SLP: CIR recommended; Dysphagia 3 with nectar-thick liquids recommended by SLP.  - Permissive HTN: Mostly normotensive since admission.  - Hb A1c 5.7%, lipids (LDL 107) - Statin started at admission  Discharge Instructions Discharge Instructions    Ambulatory referral to Neurology    Complete by:  As directed   Follow up with NP Dar[facility]yn [facility] in about 2 months. Thanks.   Discharge instructions    Complete by:  As directed   See discharge summary       Medication List    TAKE these medications   alendronate 70 MG tablet Commonly known as:  FOSAMAX Take 70 mg by mouth once a week. Take with a full glass of water on an empty stomach.   ALPRAZolam 0.25 MG tablet Commonly known as:  XANAX Take 1 tablet (0.25 mg total) by mouth 3 (three) times daily as needed (tremor).   aspirin 325 MG tablet Take 1 tablet (325 mg total) by mouth daily.   atorvastatin 40 MG tablet Commonly known as:  LIPITOR Take 1 tablet (40 mg total) by mouth daily at 6 PM.   esomeprazole 40 MG capsule Commonly known as:  NEXIUM Take 40 mg by mouth daily at 12 noon.   ibuprofen 200 MG tablet Commonly known as:  ADVIL,MOTRIN Take 400 mg by mouth every 6 (six) hours as needed for moderate pain.   oxybutynin 5 MG tablet Commonly known as:  DITROPAN Take 5 mg by mouth at bedtime.   topiramate 25 MG tablet Commonly known as:  TOPAMAX Take 25  mg by mouth 2 (two) times daily.      Follow-up Information    Nilda Riggs, NP. Schedule an appointment as soon as possible for a visit in 2 month(s).   Specialty:  Family Medicine Why:  Stroke follow up Contact information: [address] [place] [address] [phone]        Gaspar Garbe, MD. Call today.   Specialty:   Internal Medicine Why:  following rehabilitation. Contact information: [address] [place] [address] [phone]          No Known Allergies  Consultations:  Neurology, Dr. Roda Shutters  Procedures/Studies: Ct Angio Head W Or Wo Contrast  Result Date: 02/02/2016 CLINICAL DATA:  Initial evaluation for acute stroke. EXAM: CT ANGIOGRAPHY HEAD AND NECK TECHNIQUE: Multidetector CT imaging of the head and neck was performed using the standard protocol during bolus administration of intravenous contrast. Multiplanar CT image reconstructions and MIPs were obtained to evaluate the vascular anatomy. Carotid stenosis measurements (when applicable) are obtained utilizing NASCET criteria, using the distal internal carotid diameter as the denominator. CONTRAST:  100 cc of Isovue 370. COMPARISON:  Prior MRI from earlier the same day. FINDINGS: CT HEAD Scalp soft tissues within normal limits. Globes and orbits unremarkable. Patient status post lens extraction bilaterally. Senescent calcifications noted. Visualized paranasal sinuses are clear. No mastoid effusion. Middle ear cavities are clear. Calvarium intact. Age-related cerebral atrophy with moderate chronic microvascular ischemic disease. Known acute lacunar infarct within the left internal capsule not well seen. No acute intracranial hemorrhage. No other acute large vessel territory infarct. No mass lesion, midline shift, or mass effect. No hydrocephalus. No extra-axial fluid collection. CTA NECK Aortic arch: Visualized aortic arch of normal caliber with normal branch pattern. No high-grade stenosis at the origin of the great vessels. Mild scattered plaque within the arch itself. Visualized subclavian arteries widely patent. Right carotid system: Right common carotid artery tortuous proximally. Right common carotid artery patent to the bifurcation without stenosis. No significant atheromatous plaque about the right bifurcation. Right ICA widely patent  distally to the skullbase without stenosis, dissection, or occlusion. Right external carotid artery is branches within normal limits. Left carotid system: Left common carotid artery widely patent to the bifurcation. Minimal centric plaque about the left bifurcation without stenosis. Left ICA widely patent from the bifurcation to the skullbase without stenosis, dissection, or occlusion. Vertebral arteries:Both vertebral arteries arise from the subclavian arteries. Pre foraminal left V1 segment tortuous with multifocal atheromatous irregularity with resultant mild multi focal narrowing. Vertebral arteries otherwise widely patent within the neck without stenosis, dissection, or occlusion. Skeleton: No acute osseous abnormality. No worrisome lytic or blastic osseous lesions. Advanced degenerative spondylolysis noted within cervical spine. Other neck: Visualized lungs are clear. Visualized mediastinum grossly normal. Thyroid within normal limits. No adenopathy within the neck. No acute soft tissue abnormality. CTA HEAD Anterior circulation: Petrous segments widely patent bilaterally. Scattered calcified plaque within the cavernous ICAs with mild diffuse narrowing. A1 segments patent. Anterior communicating artery normal. Anterior cerebral arteries well opacified to their distal aspects. M1 segments widely patent without stenosis or occlusion. MCA bifurcations normal. Focal short-segment severe stenosis proximal right M2 branch, middle division (series 15, image 63). Distal MCA branches well opacified and symmetric. Posterior circulation: Focal plaque at the right V4 segment as it crosses the dural margin with secondary minimal narrowing. Right vertebral artery otherwise widely patent to the vertebrobasilar junction. Minimal plaque within the left V4 segment without stenosis. Posterior inferior cerebral arteries patent. Basilar  artery fenestrated proximally. Basilar artery widely patent to its distal aspect. Superior  cerebral arteries patent. Both of the posterior cerebral arteries arise from the basilar artery. Atheromatous irregularity with moderate smooth narrowing of the left P 2 segment. More mild atheromatous irregularity within the right PCA. Venous sinuses: Patent without evidence for venous sinus thrombosis. Anatomic variants: No significant anatomic variant. No aneurysm or vascular malformation. Small focal outpouching arising from the expected location of the takeoff of the left posterior communicating artery favored to reflect a small vascular infundibulum is associated with a hypoplastic left P com. Delayed phase: No pathologic enhancement. IMPRESSION: CTA NECK IMPRESSION: 1. Negative CTA of the neck for patient age. No high-grade or critical stenosis. 2. Mild atheromatous plaque about the left carotid bifurcation without significant stenosis. 3. No significant atheromatous disease within the right carotid artery system. 4. Mild atheromatous plaque within the pre foraminal left V1 segment with resultant mild stenosis. Otherwise widely patent vertebral arteries within the neck. CTA HEAD IMPRESSION: 1. Negative for large vessel occlusion. No high-grade or correctable stenosis. 2. Short-segment severe nonocclusive stenosis proximal right M2 branch as above. 3. Calcified atheromatous plaque within the cavernous ICAs with mild diffuse narrowing. 4. Mild to moderate multifocal atheromatous irregularity within the PCAs bilaterally, left greater than right. Electronically Signed   By: Rise Mu M.D.   On: 02/02/2016 02:08   Dg Chest 2 View  Result Date: 02/01/2016 CLINICAL DATA:  Stroke-like symptoms. EXAM: CHEST  2 VIEW COMPARISON:  None. FINDINGS: Cardiomediastinal silhouette is normal. Mediastinal contours appear intact. Torturous atherosclerotic aorta. There is no evidence of focal airspace consolidation, pleural effusion or pneumothorax. Osseous structures are without acute abnormality. Soft tissues are  grossly normal. Breast implants are noted. IMPRESSION: No active cardiopulmonary disease. Electronically Signed   By: Ted Mcalpine M.D.   On: 02/01/2016 18:15   Ct Angio Neck W Or Wo Contrast  Result Date: 02/02/2016 CLINICAL DATA:  Initial evaluation for acute stroke. EXAM: CT ANGIOGRAPHY HEAD AND NECK TECHNIQUE: Multidetector CT imaging of the head and neck was performed using the standard protocol during bolus administration of intravenous contrast. Multiplanar CT image reconstructions and MIPs were obtained to evaluate the vascular anatomy. Carotid stenosis measurements (when applicable) are obtained utilizing NASCET criteria, using the distal internal carotid diameter as the denominator. CONTRAST:  100 cc of Isovue 370. COMPARISON:  Prior MRI from earlier the same day. FINDINGS: CT HEAD Scalp soft tissues within normal limits. Globes and orbits unremarkable. Patient status post lens extraction bilaterally. Senescent calcifications noted. Visualized paranasal sinuses are clear. No mastoid effusion. Middle ear cavities are clear. Calvarium intact. Age-related cerebral atrophy with moderate chronic microvascular ischemic disease. Known acute lacunar infarct within the left internal capsule not well seen. No acute intracranial hemorrhage. No other acute large vessel territory infarct. No mass lesion, midline shift, or mass effect. No hydrocephalus. No extra-axial fluid collection. CTA NECK Aortic arch: Visualized aortic arch of normal caliber with normal branch pattern. No high-grade stenosis at the origin of the great vessels. Mild scattered plaque within the arch itself. Visualized subclavian arteries widely patent. Right carotid system: Right common carotid artery tortuous proximally. Right common carotid artery patent to the bifurcation without stenosis. No significant atheromatous plaque about the right bifurcation. Right ICA widely patent distally to the skullbase without stenosis, dissection, or  occlusion. Right external carotid artery is branches within normal limits. Left carotid system: Left common carotid artery widely patent to the bifurcation. Minimal centric plaque about the left bifurcation  without stenosis. Left ICA widely patent from the bifurcation to the skullbase without stenosis, dissection, or occlusion. Vertebral arteries:Both vertebral arteries arise from the subclavian arteries. Pre foraminal left V1 segment tortuous with multifocal atheromatous irregularity with resultant mild multi focal narrowing. Vertebral arteries otherwise widely patent within the neck without stenosis, dissection, or occlusion. Skeleton: No acute osseous abnormality. No worrisome lytic or blastic osseous lesions. Advanced degenerative spondylolysis noted within cervical spine. Other neck: Visualized lungs are clear. Visualized mediastinum grossly normal. Thyroid within normal limits. No adenopathy within the neck. No acute soft tissue abnormality. CTA HEAD Anterior circulation: Petrous segments widely patent bilaterally. Scattered calcified plaque within the cavernous ICAs with mild diffuse narrowing. A1 segments patent. Anterior communicating artery normal. Anterior cerebral arteries well opacified to their distal aspects. M1 segments widely patent without stenosis or occlusion. MCA bifurcations normal. Focal short-segment severe stenosis proximal right M2 branch, middle division (series 15, image 63). Distal MCA branches well opacified and symmetric. Posterior circulation: Focal plaque at the right V4 segment as it crosses the dural margin with secondary minimal narrowing. Right vertebral artery otherwise widely patent to the vertebrobasilar junction. Minimal plaque within the left V4 segment without stenosis. Posterior inferior cerebral arteries patent. Basilar artery fenestrated proximally. Basilar artery widely patent to its distal aspect. Superior cerebral arteries patent. Both of the posterior cerebral  arteries arise from the basilar artery. Atheromatous irregularity with moderate smooth narrowing of the left P 2 segment. More mild atheromatous irregularity within the right PCA. Venous sinuses: Patent without evidence for venous sinus thrombosis. Anatomic variants: No significant anatomic variant. No aneurysm or vascular malformation. Small focal outpouching arising from the expected location of the takeoff of the left posterior communicating artery favored to reflect a small vascular infundibulum is associated with a hypoplastic left P com. Delayed phase: No pathologic enhancement. IMPRESSION: CTA NECK IMPRESSION: 1. Negative CTA of the neck for patient age. No high-grade or critical stenosis. 2. Mild atheromatous plaque about the left carotid bifurcation without significant stenosis. 3. No significant atheromatous disease within the right carotid artery system. 4. Mild atheromatous plaque within the pre foraminal left V1 segment with resultant mild stenosis. Otherwise widely patent vertebral arteries within the neck. CTA HEAD IMPRESSION: 1. Negative for large vessel occlusion. No high-grade or correctable stenosis. 2. Short-segment severe nonocclusive stenosis proximal right M2 branch as above. 3. Calcified atheromatous plaque within the cavernous ICAs with mild diffuse narrowing. 4. Mild to moderate multifocal atheromatous irregularity within the PCAs bilaterally, left greater than right. Electronically Signed   By: Rise Mu M.D.   On: 02/02/2016 02:08   Mr Brain Wo Contrast  Result Date: 02/01/2016 CLINICAL DATA:  Right-sided facial droop. Stroke like symptoms. Slurred speech. Symptoms began at 7:30 a.m. EXAM: MRI HEAD WITHOUT CONTRAST TECHNIQUE: Multiplanar, multiecho pulse sequences of the brain and surrounding structures were obtained without intravenous contrast. COMPARISON:  CT head without contrast 03/07/2013 FINDINGS: Brain: Acute nonhemorrhagic linear infarct is present within the  posterior limb of the left internal capsule measuring up to 14 mm. No other acute infarct is present. Extensive dilated perivascular spaces are present throughout the basal ganglia and thalami. Advanced atrophy and diffuse white matter disease is present bilaterally. The ventricles are proportionate to the degree of atrophy. White matter changes extend into the brainstem. The brainstem and cerebellum are otherwise unremarkable. Internal auditory canals are within normal limits. Vascular: Flow is present in the major intracranial arteries. Skull and upper cervical spine: The skullbase is within normal limits. Midline  sagittal images demonstrate an infarct involving the anterior corpus callosum. Midline intracranial structures are otherwise within normal limits. Mild degenerative changes are present in the upper cervical spine, and consistent with a age. Sinuses/Orbits: The paranasal sinuses and mastoid air cells are clear. Bilateral lens replacements are present. The globes and orbits are intact. Other: IMPRESSION: 1. Acute nonhemorrhagic 1.4 cm linear infarct within the posterior limb of the left internal capsule. This corresponds with the patient's right-sided symptoms and a aphasia. 2. Advanced atrophy and diffuse white matter disease likely reflects the sequela of chronic microvascular ischemia without other acute infarct. 3. Dilated perivascular spaces throughout the basal ganglia. These results were called by telephone at the time of interpretation on 02/01/2016 at 3:58 pm to Dr. Rubin Payor, who verbally acknowledged these results. Electronically Signed   By: Marin Roberts M.D.   On: 02/01/2016 16:01    Echo Study Conclusions - Procedure narrative: Transthoracic echocardiography. Image quality was adequate. The study was technically difficult. - Left ventricle: The cavity size was normal. Wall thickness was normal. Although no diagnostic regional wall motion abnormality was identified, this  possibility cannot be completely excluded on the basis of this study. Doppler parameters are consistent with abnormal left ventricular relaxation (grade 1 diastolic dysfunction). - Aortic valve: There was mild regurgitation. - Mitral valve: Mildly calcified annulus.  Subjective: Pt with some reported improvement in speech, but she feels her right arm is weaker overall. No chest pain, dyspnea, HA, fever.   Discharge Exam: Vitals:   02/03/16 0904 02/03/16 1333  BP: (!) 102/57 (!) 104/59  Pulse: 83 81  Resp: 18 18  Temp: 98 F (36.7 C) 98.6 F (37 C)   Vitals:   02/03/16 0048 02/03/16 0701 02/03/16 0904 02/03/16 1333  BP: 131/74 130/79 (!) 102/57 (!) 104/59  Pulse: 68 64 83 81  Resp: 18 18 18 18   Temp: 98.7 F (37.1 C) 98.7 F (37.1 C) 98 F (36.7 C) 98.6 F (37 C)  TempSrc: Oral Oral Oral Oral  SpO2: 94% 98% 99% 96%  Weight:      Height:       General: Pt is alert, awake, not in acute distress Cardiovascular: RRR, S1/S2 +, no rubs, no gallops Respiratory: CTA bilaterally, no wheezing, no rhonchi Abdominal: Soft, NT, ND, bowel sounds + Extremities: no edema, no cyanosis Neuro: Alert and oriented. Right facial droop and slurring of speech. Right grip 2/5, right LE 4/5. Left strength 5/5 x 2 ext.   The results of significant diagnostics from this hospitalization (including imaging, microbiology, ancillary and laboratory) are listed below for reference.    Microbiology: No results found for this or any previous visit (from the past 240 hour(s)).   Labs: BNP (last 3 results) No results for input(s): BNP in the last 8760 hours. Basic Metabolic Panel:  Recent Labs Lab 02/01/16 1418 02/01/16 1443  NA 138 141  K 4.1 4.2  CL 109 106  CO2 24  --   GLUCOSE 112* 111*  BUN 22* 24*  CREATININE 0.70 0.70  CALCIUM 9.0  --    Liver Function Tests:  Recent Labs Lab 02/01/16 1418  AST 19  ALT 13*  ALKPHOS 73  BILITOT 0.3  PROT 6.5  ALBUMIN 3.3*   No  results for input(s): LIPASE, AMYLASE in the last 168 hours. No results for input(s): AMMONIA in the last 168 hours. CBC:  Recent Labs Lab 02/01/16 1418 02/01/16 1443  WBC 11.5*  --   NEUTROABS 8.5*  --  HGB 12.4 12.9  HCT 39.0 38.0  MCV 90.1  --   PLT 285  --    Cardiac Enzymes: No results for input(s): CKTOTAL, CKMB, CKMBINDEX, TROPONINI in the last 168 hours. BNP: Invalid input(s): POCBNP CBG: No results for input(s): GLUCAP in the last 168 hours. D-Dimer No results for input(s): DDIMER in the last 72 hours. Hgb A1c  Recent Labs  02/02/16 0745  HGBA1C 5.7*   Lipid Profile  Recent Labs  02/02/16 0745  CHOL 162  HDL 40*  LDLCALC 107*  TRIG 75  CHOLHDL 4.1   Thyroid function studies No results for input(s): TSH, T4TOTAL, T3FREE, THYROIDAB in the last 72 hours.  Invalid input(s): FREET3 Anemia work up No results for input(s): VITAMINB12, FOLATE, FERRITIN, TIBC, IRON, RETICCTPCT in the last 72 hours. Urinalysis    Component Value Date/Time   COLORURINE YELLOW 02/01/2016 1452   APPEARANCEUR CLEAR 02/01/2016 1452   LABSPEC 1.016 02/01/2016 1452   PHURINE 6.0 02/01/2016 1452   GLUCOSEU NEGATIVE 02/01/2016 1452   HGBUR NEGATIVE 02/01/2016 1452   BILIRUBINUR NEGATIVE 02/01/2016 1452   KETONESUR NEGATIVE 02/01/2016 1452   PROTEINUR NEGATIVE 02/01/2016 1452   NITRITE NEGATIVE 02/01/2016 1452   LEUKOCYTESUR NEGATIVE 02/01/2016 1452   Sepsis Labs Invalid input(s): PROCALCITONIN,  WBC,  LACTICIDVEN Microbiology No results found for this or any previous visit (from the past 240 hour(s)).  Time coordinating discharge: Over 30 minutes  Hazeline Junker, MD  [facility] 02/03/2016, 3:15 PM Pager [phone]  If 7PM-7AM, please contact night-coverage [email or web address] Password [email or web address]

## 2016-02-03 NOTE — Progress Notes (Addendum)
Inpatient Rehabilitation  I met with the patient and her family at the bedside to discuss the recommendation for CIR. I provided informational booklets and answered their questions.  Pt. and family want CIR and will provide care following a rehab stay. Dr. Bonner Puna has medically cleared pt. For admission to CIR. I have updated Jacqualin Combes, RNCM as well as pt's RN Daphne.  I will make all arrangements.  Please call if questions.  [place] Admissions Coordinator Cell [phone] Office [phone] ission to CIR later today

## 2016-02-03 NOTE — Progress Notes (Signed)
Physical Therapy Treatment Patient Details Name: Kelly [name] MRN: [id number] DOB: 07/15/1927 Today's Date: 02/03/2016    History of Present Illness 82 y.o.femalewith medical history significant for essential tremor, psoriasis and osteoporosis presenting with acute onset of R facial droop. MRI on 9/9 + for acute infarct within the posterior limb of the L internal capsule.     PT Comments    Patient progressing slowly towards PT goals. Tolerated gait training with total A of 2 today with cues for facilitation of upright, progression of RLE and overall sequencing. Pt reluctant to weight shift to the right. Highly motivated to return to PLOF. Education on overflow and neuroplasticity. Plans to d/c to CIR today. Will follow if still in hospital.  Follow Up Recommendations  CIR     Equipment Recommendations  None recommended by PT    Recommendations for Other Services       Precautions / Restrictions Precautions Precautions: Fall Precaution Comments: up w/assist, right side weakness; hx fall 2 months ago; right hip bursitis Restrictions Weight Bearing Restrictions: No    Mobility  Bed Mobility Overal bed mobility: Needs Assistance Bed Mobility: Supine to Sit     Supine to sit: Min assist;HOB elevated     General bed mobility comments: able to transition to left EOB with use of rail and multiple effortful but ineffective scoots to get to edge, needs physical assist (draw sheet) and faciliation with instructional cues to reciprocally scoot  Transfers Overall transfer level: Needs assistance Equipment used: 1 person hand held assist;2 person hand held assist Transfers: Sit to/from [employer] Sit to Stand: Mod assist Stand pivot transfers: Mod assist       General transfer comment: Manual placement of RLE and for upright posture to facilitate anterior pelvic tilt and thoracic and lumbar extension; cues for anterior weight shift and translation. SPT chair  to/from [facility] Mod A.  Ambulation/Gait Ambulation/Gait assistance: Total assist;+2 physical assistance Ambulation Distance (Feet): 10 Feet Assistive device: 1 person hand held assist Gait Pattern/deviations: Step-through pattern;Decreased stride length;Ataxic;Narrow base of support;Trunk flexed;Decreased weight shift to right Gait velocity: decreased   General Gait Details: Pt with heavy left lateral lean with manual cues to advance RLE and stabilize during stance phase; cues for hip/spine extension, tacile cues to promote right knee extension prior to weight shifting.   Stairs            Wheelchair Mobility    Modified Rankin (Stroke Patients Only) Modified Rankin (Stroke Patients Only) Pre-Morbid Rankin Score: No significant disability Modified Rankin: Moderately severe disability     Balance Overall balance assessment: Needs assistance Sitting-balance support: Feet supported;Single extremity supported Sitting balance-Leahy Scale: Fair Sitting balance - Comments: Able to sit EOB with cues for upright; manual cues to initiate thoracic and lumbar extension but only able to maintain for a few seconds and fatigues. Kyphotic posture with left lean bias.  Postural control: Left lateral lean Standing balance support: During functional activity Standing balance-Leahy Scale: Zero Standing balance comment: Unable to maintain standing unless Max support provided externally to assist with upright posture and balance.                    Cognition Arousal/Alertness: Awake/alert Behavior During Therapy: WFL for tasks assessed/performed Overall Cognitive Status: Within Functional Limits for tasks assessed                      Exercises      General Comments General comments (skin  integrity, edema, etc.): Daughter present during session.      Pertinent Vitals/Pain Pain Assessment: No/denies pain    Home Living                      Prior Function             PT Goals (current goals can now be found in the care plan section) Progress towards PT goals: Progressing toward goals    Frequency  Min 4X/week    PT Plan Current plan remains appropriate    Co-evaluation             End of Session Equipment Utilized During Treatment: Gait belt Activity Tolerance: Patient tolerated treatment well Patient left: in chair;with call bell/phone within reach;with family/visitor present;with chair alarm set     Time: 1531-1605 PT Time Calculation (min) (ACUTE ONLY): 34 min  Charges:  $Gait Training: 8-22 mins $Therapeutic Activity: 8-22 mins                    G Codes:      Shametra Cumberland A Junaid Wurzer 02/03/2016, 4:20 PM  Mylo RedShauna Katharyn Schauer, PT, DPT [phone]

## 2016-02-03 NOTE — Care Management Note (Signed)
Case Management Note  Patient Details  Name: Dan HumphreysBennie H Hakanson MRN: [id number] Date of Birth: [birth year]  Subjective/Objective:   Pt admitted with CVA. She is from home with spouse.                 Action/Plan: Recommendations are for CIR. CM following for discharge disposition.   Expected Discharge Date:   (Pending)               Expected Discharge Plan:  IP Rehab Facility  In-House Referral:     Discharge planning Services     Post Acute Care Choice:    Choice offered to:     DME Arranged:    DME Agency:     HH Arranged:    HH Agency:     Status of Service:  In process, will continue to follow  If discussed at Long Length of Stay Meetings, dates discussed:    Additional Comments:  Kermit BaloKelli F Anahy Esh, RN 02/03/2016, 11:47 AM

## 2016-02-03 NOTE — PMR Pre-admission (Signed)
PMR Admission Coordinator Pre-Admission Assessment  Patient: Kelly [name] is an 82 y.o., female MRN: [id number] DOB: May 26, 1927 Height: 5' (152.4 cm) Weight: 52.2 kg (115 lb)              Insurance Information HMO:     PPO:      PCP:      IPA:      80/20:      OTHER:  PRIMARY: Medicare A and B      Policy#: [id number]      Subscriber:  Se;f CM Name:       Phone#:      Fax#:  Pre-Cert#:       Employer: retired Benefits:  Phone #:      Name:  Eff. Date:  11/23/1994     Deduct:  $1316      Out of Pocket Max:  n/a      Life Max: n/a CIR:  100%      SNF:  100% first 20 days Outpatient:  80%     Co-Pay: 20% Home Health:  100%      Co-Pay:   DME:  80%     Co-Pay:  20% Providers:  [facility]. [facility]      Policy#: [id number] a      Subscriber:  self CM Name:       Phone#:      Fax#:  Pre-Cert#:       Employer:  Benefits:  Phone #:      Name:  Eff. Date:      Deduct:       Out of Pocket Max:       Life Max:  CIR:       SNF:  Outpatient:      Co-Pay:  Home Health:       Co-Pay:  DME:      Co-Pay:   Medicaid Application Date:       Case Manager:  Disability Application Date:       Case Worker:   Emergency Contact Information Contact Information    Name Relation Home Work [place] Daughter [phone]     Bodley,[name] Spouse [phone]  [phone]     Current Medical History  Patient Admitting Diagnosis: Linear infarct within the posterior limb of the left internal capsule History of Present Illness: Kelly H Rallsis a 82 y.o.right handed femalewith history of essential tremor. Per chart review patient lives with spouse. 2 level home with bedroom on first floor. 2 steps to entry. Used a walker prior to admission. Presented 02/01/2016 with right side weakness as well as slurred speech. MRI of the brain showed acute nonhemorrhagic 1.4 cm linear infarct within the posterior limb of the left internal capsule. CTA of head and neck negative. Patient did not receive  TPA. Neurology consulted presently on aspirin for CVA prophylaxis. Echocardiogram With no wall motion abnormalities. Grade 1 diastolic dysfunction.. Subcutaneous heparin for DVT prophylaxis. Dysphagia #3 nectar thick liquid diet. Physical and occupational therapy evaluationscompleted with recommendations of physical medicine rehabilitation consult.Patient was admitted for a comprehensive rehabilitation program Total: 7 [facility]    Past Medical History  Past Medical History:  Diagnosis Date  . Dysphonia 02/20/2013  . Essential and other specified forms of tremor 02/20/2013  . Essential tremor   . Osteoporosis   . Psoriasis     Family History  family history includes Arthritis in her brother; Osteoporosis in her maternal grandmother.  Prior Rehab/Hospitalizations:  Has the patient  had major surgery during 100 days prior to admission? No  Current Medications   Current Facility-Administered Medications:  .   stroke: mapping our early stages of recovery book, , Does not apply, Once, [name], PA-C .  acetaminophen (TYLENOL) tablet 650 mg, 650 mg, Oral, Q4H PRN **OR** acetaminophen (TYLENOL) suppository 650 mg, 650 mg, Rectal, Q4H PRN, [name], PA-C .  aspirin suppository 300 mg, 300 mg, Rectal, Daily **OR** aspirin tablet 325 mg, 325 mg, Oral, Daily, [name], PA-C, 325 mg at 02/03/16 1024 .  atorvastatin (LIPITOR) tablet 40 mg, 40 mg, Oral, q1800, [name], MD, 40 mg at 02/02/16 2143 .  heparin injection 5,000 Units, 5,000 Units, Subcutaneous, Q8H, [name], PA-C, 5,000 Units at 02/03/16 1419 .  oxybutynin (DITROPAN) tablet 5 mg, 5 mg, Oral, QHS, [name], PA-C, 5 mg at 02/02/16 2131 .  pantoprazole (PROTONIX) EC tablet 40 mg, 40 mg, Oral, Daily, [name], PA-C, 40 mg at 02/03/16 1024 .  senna-docusate (Senokot-S) tablet 1 tablet, 1 tablet, Oral, QHS PRN, [name], PA-C .  topiramate (TOPAMAX) tablet 25 mg, 25 mg, Oral, BID, [name], PA-C, 25  mg at 02/03/16 1024  Patients Current Diet: DIET DYS 3 Room service appropriate? Yes; Fluid consistency: Nectar Thick  Precautions / Restrictions Precautions Precautions: Fall Precaution Comments: up w/assist, right side weakness; hx fall 2 months ago; right hip bursitis Restrictions Weight Bearing Restrictions: No   Has the patient had 2 or more falls or a fall with injury in the past year?Yes; pt. Larey Seat about a week ago in [place] while at a wedding, injuring right hip  Prior Activity Level Community (5-7x/wk): Hussband reports pt. and he walked 2 miles about 3 times per week up [place] last coulple of weeks.  Pt and he go out most days, and play duplicate bridge 2x/week  Home Assistive Devices / Equipment Home Assistive Devices/Equipment: Blood pressure cuff, Cane (specify quad or straight), Eyeglasses, Hearing aid, Hand-held shower hose, Walker (specify type) Home Equipment: Walker - 4 wheels  Prior Device Use: Indicate devices/aids used by the patient prior to current illness, exacerbation or injury? Walker; prior to pt's fall about a week ago, she did not use an assistive device.  She has used a rolling walker since the fall due to hip pain  Prior Functional Level Prior Function Level of Independence: Independent with assistive device(s) Comments: Pt had a fall a few weeks ago while she was out dancing with her husband; since then she has been using a rollator for all mobility. Prior to fall pt was independent with mobility and ADL with use of AD. Has housekeeper that does cleaning every other week.  Self Care: Did the patient need help bathing, dressing, using the toilet or eating?  Independent  Indoor Mobility: Did the patient need assistance with walking from room to room (with or without device)? Independent  Stairs: Did the patient need assistance with internal or external stairs (with or without device)? Independent  Functional Cognition: Did the patient need help planning  regular tasks such as shopping or remembering to take medications? Independent  Current Functional Level Cognition  Arousal/Alertness: Awake/alert Overall Cognitive Status: Within Functional Limits for tasks assessed Orientation Level: Oriented to person, Oriented to place, Oriented to situation, Disoriented to time Attention: Sustained Sustained Attention: Appears intact Memory: Appears intact (10- WNL on Cognistat) Awareness: Appears intact Problem Solving: Appears intact Safety/Judgment: Appears intact    Extremity Assessment (  includes Sensation/Coordination)  Upper Extremity Assessment: Defer to OT evaluation RUE Deficits / Details: Full AROM. Strength grossly 4-/5. Poor fine/gross motor coordination. Pt reports numbness distally. RUE Sensation: decreased light touch RUE Coordination: decreased gross motor, decreased fine motor  Lower Extremity Assessment: RLE deficits/detail RLE Deficits / Details: grossly 3/5 hip, 3-/5 knee, 2/5 ankle with lack of coordination and demonstrable buckling at knee with standing activites needing cues to extend R knee prior to wt beargin RLE Coordination: decreased gross motor    ADLs  Overall ADL's : Needs assistance/impaired Eating/Feeding: Set up, Sitting, With adaptive utensils Eating/Feeding Details (indicate cue type and reason): Set pt up with red foam for increased independence with self feeding. Encouraged use of R hand if possible. Pt able to demo small bites of applesauce using build up handle on spoon. Sitting supported in chair. Grooming: Minimal assistance, Sitting Upper Body Bathing: Minimal assitance, Sitting Lower Body Bathing: Maximal assistance, Sit to/from stand Upper Body Dressing : Minimal assistance, Sitting Upper Body Dressing Details (indicate cue type and reason): Assist to guide RUE into hospital gown. Lower Body Dressing: Maximal assistance, Sit to/from stand Toilet Transfer: Moderate assistance, Stand-pivot, BSC,  RW Toilet Transfer Details (indicate cue type and reason): Simulated by stand pivot from EOB to chair Toileting- Clothing Manipulation and Hygiene: Maximal assistance, Sit to/from stand Functional mobility during ADLs: Moderate assistance, Rolling walker (for stand pivot only) General ADL Comments: Encouraged functional use of RUE as much as possible. Discussed post acute rehab and pt is agreeable; very motivated to return to functional independence. Upon sitting EOB; pt noted to be loosing food/liquid from R side of mouth (RN and SLP notified).     Mobility  Overal bed mobility: Needs Assistance Bed Mobility: Supine to Sit Supine to sit: Min assist, HOB elevated General bed mobility comments: able to transition to left EOB with use of rail and multiple effortful but ineffective scoots to get to edge, needs physical assist (draw sheet) and faciliation with instructional cues to reciprocally scoot    Transfers  Overall transfer level: Needs assistance Equipment used: 1 person hand held assist, 2 person hand held assist Transfers: Sit to/from Stand, Stand Pivot Transfers Sit to Stand: Mod assist Stand pivot transfers: Mod assist General transfer comment: Manual placement of RLE and for upright posture to facilitate anterior pelvic tilt and thoracic and lumbar extension; cues for anterior weight shift and translation. [facility].    Ambulation / Gait / Stairs / Wheelchair Mobility  Ambulation/Gait Ambulation/Gait assistance: Total assist, +2 physical assistance Ambulation Distance (Feet): 10 Feet Assistive device: 1 person hand held assist Gait Pattern/deviations: Step-through pattern, Decreased stride length, Ataxic, Narrow base of support, Trunk flexed, Decreased weight shift to right General Gait Details: Pt with heavy left lateral lean with manual cues to advance RLE and stabilize during stance phase; cues for hip/spine extension, tacile cues to promote right knee  extension prior to weight shifting. Gait velocity: decreased    Posture / Balance Dynamic Sitting Balance Sitting balance - Comments: Able to sit EOB with cues for upright; manual cues to initiate thoracic and lumbar extension but only able to maintain for a few seconds and fatigues. Kyphotic posture with left lean bias.  Static Standing Balance Single Leg Stance - Right Leg: 0 Single Leg Stance - Left Leg: 0 [place] - Eyes Opened: 0 Balance Overall balance assessment: Needs assistance Sitting-balance support: Feet supported, Single extremity supported Sitting balance-Leahy Scale: Fair Sitting balance - Comments: Able to  sit EOB with cues for upright; manual cues to initiate thoracic and lumbar extension but only able to maintain for a few seconds and fatigues. Kyphotic posture with left lean bias.  Postural control: Left lateral lean Standing balance support: During functional activity Standing balance-Leahy Scale: Zero Standing balance comment: Unable to maintain standing unless Max support provided externally to assist with upright posture and balance. Single Leg Stance - Right Leg: 0 Single Leg Stance - Left Leg: 0 Rhomberg - Eyes Opened: 0    Special needs/care consideration BiPAP/CPAP   no CPM  no Continuous Drip IV   no Dialysis   no       Life Vest   no Oxygen   no Special Bed   no Trach Size   no Wound Vac (area)   no      Skin WDL per nursing assessment                             Bowel mgmt: last BM 02/02/16 per pt, continent Bladder mgmt: continent Diabetic mgmt n/a     Previous Home Environment Living Arrangements: Spouse/significant other  Lives With: Spouse Available Help at Discharge: Family, Available 24 hours/day Type of Home: House Home Layout: Two level, Able to live on main level with bedroom/bathroom Home Access: Stairs to enter [employer] of Steps: 2 Bathroom Shower/Tub: Engineer, manufacturing systems: Standard Bathroom  Accessibility: Yes How Accessible: Accessible via walker Home Care Services: No  Discharge Living Setting Plans for Discharge Living Setting: Patient's home Type of Home at Discharge: House Discharge Home Layout: Two level, Able to live on main level with bedroom/bathroom Discharge Home Access: Stairs to enter Entrance Stairs-Rails: None Entrance Stairs-Number of Steps: 2 Discharge Bathroom Shower/Tub: Tub/shower unit Discharge Bathroom Toilet: Standard Discharge Bathroom Accessibility: Yes How Accessible: Accessible via walker Does the patient have any problems obtaining your medications?: No  Social/Family/Support Systems Patient Roles: Spouse, Parent Anticipated Caregiver: patient's husband, [name] Gough (51 year old but active and drives) and daughter/son in law who live immediately beside pt. and her husband.  Daughter is Pasty Arch Anticipated Caregiver's Contact Information: Duyen Beckom, [phone] and Pasty Arch, [phone] Ability/Limitations of Caregiver: Toia Micale , spouse, is 61 years old but active and walks without a device; daughter Pasty Arch does not work outside of her home and is willing to care for her mother Caregiver Availability: 24/7 Discharge Plan Discussed with Primary Caregiver: Yes Is Caregiver In Agreement with Plan?: Yes Does Caregiver/Family have Issues with Lodging/Transportation while Pt is in Rehab?: No   Goals/Additional Needs Patient/Family Goal for Rehab: min/mod PT and OT; independent and modified independent SLP Expected length of stay: 17-20 days Cultural Considerations: n/a Dietary Needs: dysphagia 3, nectar thick Equipment Needs: TBA Pt/Family Agrees to Admission and willing to participate: Yes Program Orientation Provided & Reviewed with Pt/Caregiver Including Roles  & Responsibilities: Yes   Decrease burden of Care through IP rehab admission: n/a   Possible need for SNF placement upon discharge: not  anticipated   Patient Condition: This patient's condition remains as documented in the consult dated 02/03/16 , in which the Rehabilitation Physician determined and documented that the patient's condition is appropriate for intensive rehabilitative care in an inpatient rehabilitation facility. Will admit to inpatient rehab today.   Preadmission Screen Completed By:  Weldon Picking, 02/03/2016 5:34 PM ______________________________________________________________________   Discussed status with Dr.  Allena Katz on 02/03/16 at  1734  and received telephone  approval for admission today.  Admission Coordinator:  Weldon Picking, time 1610 /Date 02/03/16

## 2016-02-03 NOTE — H&P (Addendum)
Physical Medicine and Rehabilitation Admission H&P    Chief Complaint  Patient presents with  . Stroke Symptoms  : HPI:  Kelly [name] is a 82 y.o. right handed female with history of essential tremor. Per chart review patient lives with spouse. 2 level home with bedroom on first floor. 2 steps to entry. Used a walker prior to admission. Presented 02/01/2016 with right side weakness as well as slurred speech. MRI of the brain showed acute nonhemorrhagic 1.4 cm linear infarct within the posterior limb of the left internal capsule. CTA of head and neck negative. Patient did not receive TPA. Neurology consulted presently on aspirin for CVA prophylaxis. Echocardiogram With no wall motion abnormalities. Grade 1 diastolic dysfunction. Subcutaneous heparin for DVT prophylaxis. Dysphagia #3 nectar thick liquid diet. Physical and occupational therapy evaluations completed with recommendations of physical medicine rehabilitation consult.Patient was admitted for a comprehensive rehabilitation program  ROS Constitutional: Negative for chills and fever.  HENT: Negative for hearing loss.   Eyes: Negative for blurred vision and double vision.  Respiratory: Negative for cough and shortness of breath.   Cardiovascular: Negative for chest pain, palpitations and leg swelling.  Gastrointestinal: Positive for constipation. Negative for nausea and vomiting.  Genitourinary: Positive for urgency. Negative for dysuria and hematuria.  Musculoskeletal: Positive for falls and myalgias.  Skin: Negative for rash.  Neurological: Positive for tremors, speech change, focal weakness, weakness and headaches. Negative for seizures.  All other systems reviewed and are negative   Past Medical History:  Diagnosis Date  . Dysphonia 02/20/2013  . Essential and other specified forms of tremor 02/20/2013  . Essential tremor   . Osteoporosis   . Psoriasis    Past Surgical History:  Procedure Laterality Date  . APPENDECTOMY   1940  . bladder tack    . CATARACT EXTRACTION Bilateral   . HEMORRHOID SURGERY  1960  . HUMERUS FRACTURE SURGERY    . LAPAROSCOPIC HYSTERECTOMY  2006  . torn rotator cuff    . WRIST FRACTURE SURGERY  2005   seconday shoulder 2001   Family History  Problem Relation Age of Onset  . Arthritis Brother     spine  . Osteoporosis Maternal Grandmother    Social History:  reports that she has never smoked. She has never used smokeless tobacco. She reports that she drinks alcohol. She reports that she does not use drugs. Allergies: No Known Allergies Medications Prior to Admission  Medication Sig Dispense Refill  . alendronate (FOSAMAX) 70 MG tablet Take 70 mg by mouth once a week. Take with a full glass of water on an empty stomach.    . esomeprazole (NEXIUM) 40 MG capsule Take 40 mg by mouth daily at 12 noon.    Marland Kitchen ibuprofen (ADVIL,MOTRIN) 200 MG tablet Take 400 mg by mouth every 6 (six) hours as needed for moderate pain.     Marland Kitchen oxybutynin (DITROPAN) 5 MG tablet Take 5 mg by mouth at bedtime.    . topiramate (TOPAMAX) 25 MG tablet Take 25 mg by mouth 2 (two) times daily.    Marland Kitchen ALPRAZolam (XANAX) 0.25 MG tablet Take 1 tablet (0.25 mg total) by mouth 3 (three) times daily as needed (tremor). (Patient not taking: Reported on 02/01/2016) 30 tablet 1    Home: [place] expects to be discharged to:: Inpatient rehab Living Arrangements: Spouse/significant other Available Help at Discharge: Family, Available 24 hours/day Type of Home: House Home Access: Stairs to enter [employer] of Steps: 2 Home  Layout: Two level, Able to live on main level with bedroom/bathroom Bathroom Shower/Tub: Chiropodist: Standard Home Equipment: Environmental consultant - 4 wheels  Lives With: Spouse   Functional History: Prior Function Level of Independence: Independent with assistive device(s) Comments: Pt had a fall a few weeks ago while she was out dancing with her husband; since  then she has been using a rollator for all mobility. Prior to fall pt was independent with mobility and ADL with use of AD. Has housekeeper that does cleaning every other week.  Functional Status:  Mobility: Bed Mobility Overal bed mobility: Needs Assistance Bed Mobility: Supine to Sit Supine to sit: Min assist, HOB elevated General bed mobility comments: able to transition to left EOB with use of rail and multiple effortful but ineffective scoots to get to edge, needs physical assist (draw sheet) and faciliation with instructional cues to reciprocally scoot Transfers Overall transfer level: Needs assistance Equipment used: 1 person hand held assist Transfers: Sit to/from Stand, Stand Pivot Transfers Sit to Stand: Mod assist Stand pivot transfers: Mod assist General transfer comment: transition to stand with instructional cue to push from bed rather than pull on PT; pt mildly fearful, legs against bed for stability and gradually more unsteady over time; improved on second trial to stand and took pivotal steps to chair with frequent cues to extend R knee prior to loading; physical assit to control speed and direction of descent into sitting. Ambulation/Gait Ambulation/Gait assistance: Max assist Ambulation Distance (Feet): 1 Feet Assistive device: 1 person hand held assist General Gait Details: pregait activity at EOB including supported wt shifting with verbal/tactile cues and facilitation at R knee to promote extension prior to loading; stepping forward/backward with Right slow and effortful; pivotal steps toward impaired side with physical assist/tactile cues to promote R knee extension prior to wt shifting    ADL: ADL Overall ADL's : Needs assistance/impaired Eating/Feeding: Set up, Sitting, With adaptive utensils Eating/Feeding Details (indicate cue type and reason): Set pt up with red foam for increased independence with self feeding. Encouraged use of R hand if possible. Pt able to  demo small bites of applesauce using build up handle on spoon. Sitting supported in chair. Grooming: Minimal assistance, Sitting Upper Body Bathing: Minimal assitance, Sitting Lower Body Bathing: Maximal assistance, Sit to/from stand Upper Body Dressing : Minimal assistance, Sitting Upper Body Dressing Details (indicate cue type and reason): Assist to guide RUE into hospital gown. Lower Body Dressing: Maximal assistance, Sit to/from stand Toilet Transfer: Moderate assistance, Stand-pivot, BSC, RW Toilet Transfer Details (indicate cue type and reason): Simulated by stand pivot from EOB to chair Toileting- Clothing Manipulation and Hygiene: Maximal assistance, Sit to/from stand Functional mobility during ADLs: Moderate assistance, Rolling walker (for stand pivot only) General ADL Comments: Encouraged functional use of RUE as much as possible. Discussed post acute rehab and pt is agreeable; very motivated to return to functional independence. Upon sitting EOB; pt noted to be loosing food/liquid from R side of mouth (RN and SLP notified).   Cognition: Cognition Overall Cognitive Status: Within Functional Limits for tasks assessed Arousal/Alertness: Awake/alert Orientation Level: Oriented X4 Attention: Sustained Sustained Attention: Appears intact Memory: Appears intact (10- WNL on Cognistat) Awareness: Appears intact Problem Solving: Appears intact Safety/Judgment: Appears intact Cognition Arousal/Alertness: Awake/alert Behavior During Therapy: WFL for tasks assessed/performed Overall Cognitive Status: Within Functional Limits for tasks assessed  Physical Exam: Blood pressure (!) 102/57, pulse 83, temperature 98 F (36.7 C), temperature source Oral, resp. rate 18, height 5' (1.524  m), weight 52.2 kg (115 lb), SpO2 99 %. Physical Exam Constitutional: She appears well-developed.  Frail  HENT:  Head: Normocephalic and atraumatic.  Eyes: EOM are normal.  Pupils round and reactive to  light  Neck: Normal range of motion. Neck supple. No thyromegaly present.  Cardiovascular: Normal rate and regular rhythm.   Respiratory: Effort normal and breath sounds normal. No respiratory distress.  GI: Soft. Bowel sounds are normal. She exhibits no distension.  Musculoskeletal: She exhibits no edema or tenderness.  Neurological: She is alert.  Moderate dysarthria.  Patient provides her name, age and date of birth.  Follows simple commands.  Fair awareness of deficits Right facial droop +Resting tremor Sensation nitact to light touch DTRs symmetric Motor: RUE: shoulder abduction, wlbow flexion/extension 1+/5, wrist, hand 0/5 RLE: 4/5 hip flexion, knee extension, 4+/5 ankle dorsi/plantar flexion LUE/LLE: 5/5 proximal to distal  Psychiatric: She has a normal mood and affect. Her behavior is normal Skin: Skin is warm and dry    Results for orders placed or performed during the hospital encounter of 02/01/16 (from the past 48 hour(s))  Ethanol     Status: None   Collection Time: 02/01/16  2:18 PM  Result Value Ref Range   Alcohol, Ethyl (B) <5 <5 mg/dL    Comment:        LOWEST DETECTABLE LIMIT FOR SERUM ALCOHOL IS 5 mg/dL FOR MEDICAL PURPOSES ONLY   Protime-INR     Status: None   Collection Time: 02/01/16  2:18 PM  Result Value Ref Range   Prothrombin Time 13.4 11.4 - 15.2 seconds   INR 1.02   APTT     Status: None   Collection Time: 02/01/16  2:18 PM  Result Value Ref Range   aPTT 30 24 - 36 seconds  CBC     Status: Abnormal   Collection Time: 02/01/16  2:18 PM  Result Value Ref Range   WBC 11.5 (H) 4.0 - 10.5 K/uL   RBC 4.33 3.87 - 5.11 MIL/uL   Hemoglobin 12.4 12.0 - 15.0 g/dL   HCT 39.0 36.0 - 46.0 %   MCV 90.1 78.0 - 100.0 fL   MCH 28.6 26.0 - 34.0 pg   MCHC 31.8 30.0 - 36.0 g/dL   RDW 13.2 11.5 - 15.5 %   Platelets 285 150 - 400 K/uL  Differential     Status: Abnormal   Collection Time: 02/01/16  2:18 PM  Result Value Ref Range   Neutrophils Relative %  74 %   Neutro Abs 8.5 (H) 1.7 - 7.7 K/uL   Lymphocytes Relative 15 %   Lymphs Abs 1.8 0.7 - 4.0 K/uL   Monocytes Relative 9 %   Monocytes Absolute 1.0 0.1 - 1.0 K/uL   Eosinophils Relative 2 %   Eosinophils Absolute 0.2 0.0 - 0.7 K/uL   Basophils Relative 0 %   Basophils Absolute 0.0 0.0 - 0.1 K/uL  Comprehensive metabolic panel     Status: Abnormal   Collection Time: 02/01/16  2:18 PM  Result Value Ref Range   Sodium 138 135 - 145 mmol/L   Potassium 4.1 3.5 - 5.1 mmol/L   Chloride 109 101 - 111 mmol/L   CO2 24 22 - 32 mmol/L   Glucose, Bld 112 (H) 65 - 99 mg/dL   BUN 22 (H) 6 - 20 mg/dL   Creatinine, Ser 0.70 0.44 - 1.00 mg/dL   Calcium 9.0 8.9 - 10.3 mg/dL   Total Protein 6.5 6.5 - 8.1 g/dL  Albumin 3.3 (L) 3.5 - 5.0 g/dL   AST 19 15 - 41 U/L   ALT 13 (L) 14 - 54 U/L   Alkaline Phosphatase 73 38 - 126 U/L   Total Bilirubin 0.3 0.3 - 1.2 mg/dL   GFR calc non Af Amer >60 >60 mL/min   GFR calc Af Amer >60 >60 mL/min    Comment: (NOTE) The eGFR has been calculated using the CKD EPI equation. This calculation has not been validated in all clinical situations. eGFR's persistently <60 mL/min signify possible Chronic Kidney Disease.    Anion gap 5 5 - 15  I-stat troponin, ED (not at [facility], [facility])     Status: None   Collection Time: 02/01/16  2:41 PM  Result Value Ref Range   Troponin i, poc 0.00 0.00 - 0.08 ng/mL   Comment 3            Comment: Due to the release kinetics of cTnI, a negative result within the first hours of the onset of symptoms does not rule out myocardial infarction with certainty. If myocardial infarction is still suspected, repeat the test at appropriate intervals.   I-Stat Chem 8, ED  (not at [facility], [facility])     Status: Abnormal   Collection Time: 02/01/16  2:43 PM  Result Value Ref Range   Sodium 141 135 - 145 mmol/L   Potassium 4.2 3.5 - 5.1 mmol/L   Chloride 106 101 - 111 mmol/L   BUN 24 (H) 6 - 20 mg/dL   Creatinine, Ser 0.26 0.44 - 1.00 mg/dL    Glucose, Bld 691 (H) 65 - 99 mg/dL   Calcium, Ion 6.75 6.12 - 1.40 mmol/L   TCO2 23 0 - 100 mmol/L   Hemoglobin 12.9 12.0 - 15.0 g/dL   HCT 54.8 32.3 - 46.8 %  Urine rapid drug screen (hosp performed)not at [facility]     Status: None   Collection Time: 02/01/16  2:52 PM  Result Value Ref Range   Opiates NONE DETECTED NONE DETECTED   Cocaine NONE DETECTED NONE DETECTED   Benzodiazepines NONE DETECTED NONE DETECTED   Amphetamines NONE DETECTED NONE DETECTED   Tetrahydrocannabinol NONE DETECTED NONE DETECTED   Barbiturates NONE DETECTED NONE DETECTED    Comment:        DRUG SCREEN FOR MEDICAL PURPOSES ONLY.  IF CONFIRMATION IS NEEDED FOR ANY PURPOSE, NOTIFY LAB WITHIN 5 DAYS.        LOWEST DETECTABLE LIMITS FOR URINE DRUG SCREEN Drug Class       Cutoff (ng/mL) Amphetamine      1000 Barbiturate      200 Benzodiazepine   200 Tricyclics       300 Opiates          300 Cocaine          300 THC              50   Urinalysis, Routine w reflex microscopic (not at [facility])     Status: None   Collection Time: 02/01/16  2:52 PM  Result Value Ref Range   Color, Urine YELLOW YELLOW   APPearance CLEAR CLEAR   Specific Gravity, Urine 1.016 1.005 - 1.030   pH 6.0 5.0 - 8.0   Glucose, UA NEGATIVE NEGATIVE mg/dL   Hgb urine dipstick NEGATIVE NEGATIVE   Bilirubin Urine NEGATIVE NEGATIVE   Ketones, ur NEGATIVE NEGATIVE mg/dL   Protein, ur NEGATIVE NEGATIVE mg/dL   Nitrite NEGATIVE NEGATIVE   Leukocytes, UA NEGATIVE NEGATIVE  Comment: MICROSCOPIC NOT DONE ON URINES WITH NEGATIVE PROTEIN, BLOOD, LEUKOCYTES, NITRITE, OR GLUCOSE <1000 mg/dL.  Hemoglobin A1c     Status: Abnormal   Collection Time: 02/02/16  7:45 AM  Result Value Ref Range   Hgb A1c MFr Bld 5.7 (H) 4.8 - 5.6 %    Comment: (NOTE)         Pre-diabetes: 5.7 - 6.4         Diabetes: >6.4         Glycemic control for adults with diabetes: <7.0    Mean Plasma Glucose 117 mg/dL    Comment: (NOTE) Performed At: [facility] [address] [place], [place] [id number] Mila Homer MD [id number]   Lipid panel     Status: Abnormal   Collection Time: 02/02/16  7:45 AM  Result Value Ref Range   Cholesterol 162 0 - 200 mg/dL   Triglycerides 75 <932 mg/dL   HDL 40 (L) >04 mg/dL   Total CHOL/HDL Ratio 4.1 RATIO   VLDL 15 0 - 40 mg/dL   LDL Cholesterol 289 (H) 0 - 99 mg/dL    Comment:        Total Cholesterol/HDL:CHD Risk Coronary Heart Disease Risk Table                     Men   Women  1/2 Average Risk   3.4   3.3  Average Risk       5.0   4.4  2 X Average Risk   9.6   7.1  3 X Average Risk  23.4   11.0        Use the calculated Patient Ratio above and the CHD Risk Table to determine the patient's CHD Risk.        ATP III CLASSIFICATION (LDL):  <100     mg/dL   Optimal  357-665  mg/dL   Near or Above                    Optimal  130-159  mg/dL   Borderline  269-719  mg/dL   High  >538     mg/dL   Very High    Ct Angio Head W Or Wo Contrast  Result Date: 02/02/2016 CLINICAL DATA:  Initial evaluation for acute stroke. EXAM: CT ANGIOGRAPHY HEAD AND NECK TECHNIQUE: Multidetector CT imaging of the head and neck was performed using the standard protocol during bolus administration of intravenous contrast. Multiplanar CT image reconstructions and MIPs were obtained to evaluate the vascular anatomy. Carotid stenosis measurements (when applicable) are obtained utilizing NASCET criteria, using the distal internal carotid diameter as the denominator. CONTRAST:  100 cc of Isovue 370. COMPARISON:  Prior MRI from earlier the same day. FINDINGS: CT HEAD Scalp soft tissues within normal limits. Globes and orbits unremarkable. Patient status post lens extraction bilaterally. Senescent calcifications noted. Visualized paranasal sinuses are clear. No mastoid effusion. Middle ear cavities are clear. Calvarium intact. Age-related cerebral atrophy with moderate chronic microvascular ischemic disease. Known acute  lacunar infarct within the left internal capsule not well seen. No acute intracranial hemorrhage. No other acute large vessel territory infarct. No mass lesion, midline shift, or mass effect. No hydrocephalus. No extra-axial fluid collection. CTA NECK Aortic arch: Visualized aortic arch of normal caliber with normal branch pattern. No high-grade stenosis at the origin of the great vessels. Mild scattered plaque within the arch itself. Visualized subclavian arteries widely patent. Right carotid system: Right common carotid  artery tortuous proximally. Right common carotid artery patent to the bifurcation without stenosis. No significant atheromatous plaque about the right bifurcation. Right ICA widely patent distally to the skullbase without stenosis, dissection, or occlusion. Right external carotid artery is branches within normal limits. Left carotid system: Left common carotid artery widely patent to the bifurcation. Minimal centric plaque about the left bifurcation without stenosis. Left ICA widely patent from the bifurcation to the skullbase without stenosis, dissection, or occlusion. Vertebral arteries:Both vertebral arteries arise from the subclavian arteries. Pre foraminal left V1 segment tortuous with multifocal atheromatous irregularity with resultant mild multi focal narrowing. Vertebral arteries otherwise widely patent within the neck without stenosis, dissection, or occlusion. Skeleton: No acute osseous abnormality. No worrisome lytic or blastic osseous lesions. Advanced degenerative spondylolysis noted within cervical spine. Other neck: Visualized lungs are clear. Visualized mediastinum grossly normal. Thyroid within normal limits. No adenopathy within the neck. No acute soft tissue abnormality. CTA HEAD Anterior circulation: Petrous segments widely patent bilaterally. Scattered calcified plaque within the cavernous ICAs with mild diffuse narrowing. A1 segments patent. Anterior communicating artery  normal. Anterior cerebral arteries well opacified to their distal aspects. M1 segments widely patent without stenosis or occlusion. MCA bifurcations normal. Focal short-segment severe stenosis proximal right M2 branch, middle division (series 15, image 63). Distal MCA branches well opacified and symmetric. Posterior circulation: Focal plaque at the right V4 segment as it crosses the dural margin with secondary minimal narrowing. Right vertebral artery otherwise widely patent to the vertebrobasilar junction. Minimal plaque within the left V4 segment without stenosis. Posterior inferior cerebral arteries patent. Basilar artery fenestrated proximally. Basilar artery widely patent to its distal aspect. Superior cerebral arteries patent. Both of the posterior cerebral arteries arise from the basilar artery. Atheromatous irregularity with moderate smooth narrowing of the left P 2 segment. More mild atheromatous irregularity within the right PCA. Venous sinuses: Patent without evidence for venous sinus thrombosis. Anatomic variants: No significant anatomic variant. No aneurysm or vascular malformation. Small focal outpouching arising from the expected location of the takeoff of the left posterior communicating artery favored to reflect a small vascular infundibulum is associated with a hypoplastic left P com. Delayed phase: No pathologic enhancement. IMPRESSION: CTA NECK IMPRESSION: 1. Negative CTA of the neck for patient age. No high-grade or critical stenosis. 2. Mild atheromatous plaque about the left carotid bifurcation without significant stenosis. 3. No significant atheromatous disease within the right carotid artery system. 4. Mild atheromatous plaque within the pre foraminal left V1 segment with resultant mild stenosis. Otherwise widely patent vertebral arteries within the neck. CTA HEAD IMPRESSION: 1. Negative for large vessel occlusion. No high-grade or correctable stenosis. 2. Short-segment severe nonocclusive  stenosis proximal right M2 branch as above. 3. Calcified atheromatous plaque within the cavernous ICAs with mild diffuse narrowing. 4. Mild to moderate multifocal atheromatous irregularity within the PCAs bilaterally, left greater than right. Electronically Signed   By: Rise Mu M.D.   On: 02/02/2016 02:08   Dg Chest 2 View  Result Date: 02/01/2016 CLINICAL DATA:  Stroke-like symptoms. EXAM: CHEST  2 VIEW COMPARISON:  None. FINDINGS: Cardiomediastinal silhouette is normal. Mediastinal contours appear intact. Torturous atherosclerotic aorta. There is no evidence of focal airspace consolidation, pleural effusion or pneumothorax. Osseous structures are without acute abnormality. Soft tissues are grossly normal. Breast implants are noted. IMPRESSION: No active cardiopulmonary disease. Electronically Signed   By: Ted Mcalpine M.D.   On: 02/01/2016 18:15   Ct Angio Neck W Or Wo Contrast  Result Date:  02/02/2016 CLINICAL DATA:  Initial evaluation for acute stroke. EXAM: CT ANGIOGRAPHY HEAD AND NECK TECHNIQUE: Multidetector CT imaging of the head and neck was performed using the standard protocol during bolus administration of intravenous contrast. Multiplanar CT image reconstructions and MIPs were obtained to evaluate the vascular anatomy. Carotid stenosis measurements (when applicable) are obtained utilizing NASCET criteria, using the distal internal carotid diameter as the denominator. CONTRAST:  100 cc of Isovue 370. COMPARISON:  Prior MRI from earlier the same day. FINDINGS: CT HEAD Scalp soft tissues within normal limits. Globes and orbits unremarkable. Patient status post lens extraction bilaterally. Senescent calcifications noted. Visualized paranasal sinuses are clear. No mastoid effusion. Middle ear cavities are clear. Calvarium intact. Age-related cerebral atrophy with moderate chronic microvascular ischemic disease. Known acute lacunar infarct within the left internal capsule not well  seen. No acute intracranial hemorrhage. No other acute large vessel territory infarct. No mass lesion, midline shift, or mass effect. No hydrocephalus. No extra-axial fluid collection. CTA NECK Aortic arch: Visualized aortic arch of normal caliber with normal branch pattern. No high-grade stenosis at the origin of the great vessels. Mild scattered plaque within the arch itself. Visualized subclavian arteries widely patent. Right carotid system: Right common carotid artery tortuous proximally. Right common carotid artery patent to the bifurcation without stenosis. No significant atheromatous plaque about the right bifurcation. Right ICA widely patent distally to the skullbase without stenosis, dissection, or occlusion. Right external carotid artery is branches within normal limits. Left carotid system: Left common carotid artery widely patent to the bifurcation. Minimal centric plaque about the left bifurcation without stenosis. Left ICA widely patent from the bifurcation to the skullbase without stenosis, dissection, or occlusion. Vertebral arteries:Both vertebral arteries arise from the subclavian arteries. Pre foraminal left V1 segment tortuous with multifocal atheromatous irregularity with resultant mild multi focal narrowing. Vertebral arteries otherwise widely patent within the neck without stenosis, dissection, or occlusion. Skeleton: No acute osseous abnormality. No worrisome lytic or blastic osseous lesions. Advanced degenerative spondylolysis noted within cervical spine. Other neck: Visualized lungs are clear. Visualized mediastinum grossly normal. Thyroid within normal limits. No adenopathy within the neck. No acute soft tissue abnormality. CTA HEAD Anterior circulation: Petrous segments widely patent bilaterally. Scattered calcified plaque within the cavernous ICAs with mild diffuse narrowing. A1 segments patent. Anterior communicating artery normal. Anterior cerebral arteries well opacified to their  distal aspects. M1 segments widely patent without stenosis or occlusion. MCA bifurcations normal. Focal short-segment severe stenosis proximal right M2 branch, middle division (series 15, image 63). Distal MCA branches well opacified and symmetric. Posterior circulation: Focal plaque at the right V4 segment as it crosses the dural margin with secondary minimal narrowing. Right vertebral artery otherwise widely patent to the vertebrobasilar junction. Minimal plaque within the left V4 segment without stenosis. Posterior inferior cerebral arteries patent. Basilar artery fenestrated proximally. Basilar artery widely patent to its distal aspect. Superior cerebral arteries patent. Both of the posterior cerebral arteries arise from the basilar artery. Atheromatous irregularity with moderate smooth narrowing of the left P 2 segment. More mild atheromatous irregularity within the right PCA. Venous sinuses: Patent without evidence for venous sinus thrombosis. Anatomic variants: No significant anatomic variant. No aneurysm or vascular malformation. Small focal outpouching arising from the expected location of the takeoff of the left posterior communicating artery favored to reflect a small vascular infundibulum is associated with a hypoplastic left P com. Delayed phase: No pathologic enhancement. IMPRESSION: CTA NECK IMPRESSION: 1. Negative CTA of the neck for patient age. No  high-grade or critical stenosis. 2. Mild atheromatous plaque about the left carotid bifurcation without significant stenosis. 3. No significant atheromatous disease within the right carotid artery system. 4. Mild atheromatous plaque within the pre foraminal left V1 segment with resultant mild stenosis. Otherwise widely patent vertebral arteries within the neck. CTA HEAD IMPRESSION: 1. Negative for large vessel occlusion. No high-grade or correctable stenosis. 2. Short-segment severe nonocclusive stenosis proximal right M2 branch as above. 3. Calcified  atheromatous plaque within the cavernous ICAs with mild diffuse narrowing. 4. Mild to moderate multifocal atheromatous irregularity within the PCAs bilaterally, left greater than right. Electronically Signed   By: Jeannine Boga M.D.   On: 02/02/2016 02:08   Mr Brain Wo Contrast  Result Date: 02/01/2016 CLINICAL DATA:  Right-sided facial droop. Stroke like symptoms. Slurred speech. Symptoms began at 7:30 a.m. EXAM: MRI HEAD WITHOUT CONTRAST TECHNIQUE: Multiplanar, multiecho pulse sequences of the brain and surrounding structures were obtained without intravenous contrast. COMPARISON:  CT head without contrast 03/07/2013 FINDINGS: Brain: Acute nonhemorrhagic linear infarct is present within the posterior limb of the left internal capsule measuring up to 14 mm. No other acute infarct is present. Extensive dilated perivascular spaces are present throughout the basal ganglia and thalami. Advanced atrophy and diffuse white matter disease is present bilaterally. The ventricles are proportionate to the degree of atrophy. White matter changes extend into the brainstem. The brainstem and cerebellum are otherwise unremarkable. Internal auditory canals are within normal limits. Vascular: Flow is present in the major intracranial arteries. Skull and upper cervical spine: The skullbase is within normal limits. Midline sagittal images demonstrate an infarct involving the anterior corpus callosum. Midline intracranial structures are otherwise within normal limits. Mild degenerative changes are present in the upper cervical spine, and consistent with a age. Sinuses/Orbits: The paranasal sinuses and mastoid air cells are clear. Bilateral lens replacements are present. The globes and orbits are intact. Other: IMPRESSION: 1. Acute nonhemorrhagic 1.4 cm linear infarct within the posterior limb of the left internal capsule. This corresponds with the patient's right-sided symptoms and a aphasia. 2. Advanced atrophy and diffuse  white matter disease likely reflects the sequela of chronic microvascular ischemia without other acute infarct. 3. Dilated perivascular spaces throughout the basal ganglia. These results were called by telephone at the time of interpretation on 02/01/2016 at 3:58 pm to Dr. Alvino Chapel, who verbally acknowledged these results. Electronically Signed   By: San Morelle M.D.   On: 02/01/2016 16:01    Medical Problem List and Plan: 1.  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  DVT Prophylaxis/Anticoagulation: Subcutaneous heparin. Monitor platelet counts and any signs of bleeding 3. Pain Management: Tylenol as needed 4. Dysphagia. Dysphagia #3 nectar liquids. Monitor hydration. Follow-up speech therapy 5. Neuropsych: This patient is capable of making decisions on her own behalf. 6. Skin/Wound Care: Routine skin checks 7. Fluids/Electrolytes/Nutrition: Routine I&O with follow-up chemistries 8. Permissive hypertension. 9. Essential tremor. Patient maintained on Topamax per family 43. Hyperlipidemia. Lipitor 11. Overactive bladder. Ditropan 5 mg daily at bedtime. Check PVR 3 12. Leukocytosis: Follow CBC. 13. Prediabetes: monitor CBGs 14. Diastolic dysfunction: Monitor for fluid overload   Post Admission Physician Evaluation: 1. Functional deficits secondary  to linear infarct within the posterior limb of the left internal capsule. 2. Patient is admitted to receive collaborative, interdisciplinary care between the physiatrist, rehab nursing staff, and therapy team. 3. Patient's level of medical complexity and substantial therapy needs in context  of that medical necessity cannot be provided at a lesser intensity of care such as a SNF. 4. Patient has experienced substantial functional loss from his/her baseline which was documented above under the "Functional History" and "Functional Status" headings.  Judging  by the patient's diagnosis, physical exam, and functional history, the patient has potential for functional progress which will result in measurable gains while on inpatient rehab.  These gains will be of substantial and practical use upon discharge  in facilitating mobility and self-care at the household level. 5. Physiatrist will provide 24 hour management of medical needs as well as oversight of the therapy plan/treatment and provide guidance as appropriate regarding the interaction of the two. 6. 24 hour rehab nursing will assist with bladder management, safety, disease management, medication administration and patient education and help integrate therapy concepts, techniques,education, etc. 7. PT will assess and treat for/with: Lower extremity strength, range of motion, stamina, balance, functional mobility, safety, adaptive techniques and equipment, woundcare, coping skills, pain control, education.   Goals are: Min/Mod A. 8. OT will assess and treat for/with: ADL's, functional mobility, safety, upper extremity strength, adaptive techniques and equipment, wound mgt, ego support, and community reintegration.   Goals are: Min/Mod A. Therapy may proceed with showering this patient. 9. SLP will assess and treat for/with: speech, swallowing.  Goals are: Mod I/Ind. 10. Case Management and Social Worker will assess and treat for psychological issues and discharge planning. 11. Team conference will be held weekly to assess progress toward goals and to determine barriers to discharge. 12. Patient will receive at least 3 hours of therapy per day at least 5 days per week. 13. ELOS: 17-20 days.       14. Prognosis:  good  Delice Lesch, MD, Mellody Drown 02/03/2016

## 2016-02-03 NOTE — Progress Notes (Signed)
PROGRESS NOTE  Kelly [name]  ZOX:[id number] DOB: Jan 27, 1928 DOA: 02/01/2016 PCP: Gaspar Garbe, MD  Outpatient Specialists: GI, Dr. Russella Dar; Dr. Anne Hahn, Neurology.  Brief Narrative: Kelly [name] is a essentially healthy 82 y.o. female who presented to [facility] 9/9 after noticing acute right facial weakness and slurred speech. MRI showed acute nonhemorrhagic infarct of the left posterior limb of the internal capsule. She was outside the window for tPA and was admitted for further work up. Aspirin was started per neurology consultant. Echocardiogram was technically difficult but showed no signs of thromboembolic source. CTA of the head and neck was negative. She has received therapy for ongoing right hemiparesis/facial droop with dysphasia and dysphagia. It is recommended that she continue rehabilitation at [facility]. She is medically stable.   Assessment & Plan: Active Problems:   Stroke (cerebrum) (HCC)   Facial droop due to stroke   Hyperlipidemia   Essential hypertension   Essential tremor   Dysarthria, post-stroke   Dysphagia, post-stroke   Leukocytosis   Prediabetes   Right hemiparesis (HCC)  Acute nonhemorrhagic infarct of the posterior limb of left internal capsule: With resultant right facial droop, dysphasia, and dysphagia. Age is only major risk factor.  - Admitted to neuro floor on telemetry, neurology consultation appreciated.  - Secondary stroke prevention: Started ASA 325mg  (none prior) - Cardiac monitoring: No events - CTA head/neck: Essentially negative neck, and no large vessel occlusion or high-grade stenosis. - 2D echocardiogram: Technically difficult study with no WMA seen, no mention of ejection fraction, and some diastolic dysfunction. No thrombogenic focus was mentioned.   - PT/OT/SLP: CIR recommended; Dysphagia 3 with nectar-thick liquids recommended by SLP.  - Permissive HTN: Mostly normotensive since admission.  - Hb A1c 5.7%, lipids (LDL 107) - Statin started at  admission  DVT prophylaxis: Subcutaneous heparin Code Status: Full Family Communication: Discussed with patient and husband Disposition Plan: Work up completed this morning, awaiting disposition to ?CIR   Consultants:   Neurology, Dr. Roda Shutters  Procedures:  Echo  Study Conclusions - Procedure narrative: Transthoracic echocardiography. Image   quality was adequate. The study was technically difficult. - Left ventricle: The cavity size was normal. Wall thickness was   normal. Although no diagnostic regional wall motion abnormality   was identified, this possibility cannot be completely excluded on   the basis of this study. Doppler parameters are consistent with   abnormal left ventricular relaxation (grade 1 diastolic   dysfunction). - Aortic valve: There was mild regurgitation. - Mitral valve: Mildly calcified annulus.  Antimicrobials:  None   Subjective: Pt with some reported improvement in speech, but she feels her right arm is weaker overall. No chest pain, dyspnea, HA, fever.   Objective: Vitals:   02/02/16 2229 02/03/16 0048 02/03/16 0701 02/03/16 0904  BP: 123/61 131/74 130/79 (!) 102/57  Pulse: 69 68 64 83  Resp: 18 18 18 18   Temp: 98.2 F (36.8 C) 98.7 F (37.1 C) 98.7 F (37.1 C) 98 F (36.7 C)  TempSrc: Oral Oral Oral Oral  SpO2: 96% 94% 98% 99%  Weight:      Height:        Intake/Output Summary (Last 24 hours) at 02/03/16 1244 Last data filed at 02/03/16 0800  Gross per 24 hour  Intake              240 ml  Output                0 ml  Net  240 ml   Filed Weights   02/01/16 1404  Weight: 52.2 kg (115 lb)    Examination: General exam: 82 y.o. female in no distress Respiratory system: Non-labored breathing. Clear to auscultation bilaterally.  Cardiovascular system: Regular rate and rhythm. No murmur, rub, or gallop. No JVD, and no pedal edema. Gastrointestinal system: Abdomen soft, non-tender, non-distended, with normoactive bowel sounds.  No organomegaly or masses felt. Central nervous system: Alert and oriented. Right facial droop and slurring of speech. Right grip 2/5, right LE 4/5. Left strength 5/5 x 2 ext.  Extremities: Warm, no deformities Skin: No rashes, lesions no ulcers Psychiatry: Judgement and insight appear normal. Mood & affect appropriate.   Data Reviewed: I have personally reviewed following labs and imaging studies  CBC:  Recent Labs Lab 02/01/16 1418 02/01/16 1443  WBC 11.5*  --   NEUTROABS 8.5*  --   HGB 12.4 12.9  HCT 39.0 38.0  MCV 90.1  --   PLT 285  --    Basic Metabolic Panel:  Recent Labs Lab 02/01/16 1418 02/01/16 1443  NA 138 141  K 4.1 4.2  CL 109 106  CO2 24  --   GLUCOSE 112* 111*  BUN 22* 24*  CREATININE 0.70 0.70  CALCIUM 9.0  --    GFR: Estimated Creatinine Clearance: 36.3 mL/min (by C-G formula based on SCr of 0.8 mg/dL). Liver Function Tests:  Recent Labs Lab 02/01/16 1418  AST 19  ALT 13*  ALKPHOS 73  BILITOT 0.3  PROT 6.5  ALBUMIN 3.3*   No results for input(s): LIPASE, AMYLASE in the last 168 hours. No results for input(s): AMMONIA in the last 168 hours. Coagulation Profile:  Recent Labs Lab 02/01/16 1418  INR 1.02   Cardiac Enzymes: No results for input(s): CKTOTAL, CKMB, CKMBINDEX, TROPONINI in the last 168 hours. BNP (last 3 results) No results for input(s): PROBNP in the last 8760 hours. HbA1C:  Recent Labs  02/02/16 0745  HGBA1C 5.7*   CBG: No results for input(s): GLUCAP in the last 168 hours. Lipid Profile:  Recent Labs  02/02/16 0745  CHOL 162  HDL 40*  LDLCALC 107*  TRIG 75  CHOLHDL 4.1   Thyroid Function Tests: No results for input(s): TSH, T4TOTAL, FREET4, T3FREE, THYROIDAB in the last 72 hours. Anemia Panel: No results for input(s): VITAMINB12, FOLATE, FERRITIN, TIBC, IRON, RETICCTPCT in the last 72 hours. Urine analysis:    Component Value Date/Time   COLORURINE YELLOW 02/01/2016 1452   APPEARANCEUR CLEAR  02/01/2016 1452   LABSPEC 1.016 02/01/2016 1452   PHURINE 6.0 02/01/2016 1452   GLUCOSEU NEGATIVE 02/01/2016 1452   HGBUR NEGATIVE 02/01/2016 1452   BILIRUBINUR NEGATIVE 02/01/2016 1452   KETONESUR NEGATIVE 02/01/2016 1452   PROTEINUR NEGATIVE 02/01/2016 1452   NITRITE NEGATIVE 02/01/2016 1452   LEUKOCYTESUR NEGATIVE 02/01/2016 1452   Sepsis Labs: @LABRCNTIP (procalcitonin:4,lacticidven:4)  )No results found for this or any previous visit (from the past 240 hour(s)).   Radiology Studies: Ct Angio Head W Or Wo Contrast  Result Date: 02/02/2016 CLINICAL DATA:  Initial evaluation for acute stroke. EXAM: CT ANGIOGRAPHY HEAD AND NECK TECHNIQUE: Multidetector CT imaging of the head and neck was performed using the standard protocol during bolus administration of intravenous contrast. Multiplanar CT image reconstructions and MIPs were obtained to evaluate the vascular anatomy. Carotid stenosis measurements (when applicable) are obtained utilizing NASCET criteria, using the distal internal carotid diameter as the denominator. CONTRAST:  100 cc of Isovue 370. COMPARISON:  Prior MRI from  earlier the same day. FINDINGS: CT HEAD Scalp soft tissues within normal limits. Globes and orbits unremarkable. Patient status post lens extraction bilaterally. Senescent calcifications noted. Visualized paranasal sinuses are clear. No mastoid effusion. Middle ear cavities are clear. Calvarium intact. Age-related cerebral atrophy with moderate chronic microvascular ischemic disease. Known acute lacunar infarct within the left internal capsule not well seen. No acute intracranial hemorrhage. No other acute large vessel territory infarct. No mass lesion, midline shift, or mass effect. No hydrocephalus. No extra-axial fluid collection. CTA NECK Aortic arch: Visualized aortic arch of normal caliber with normal branch pattern. No high-grade stenosis at the origin of the great vessels. Mild scattered plaque within the arch  itself. Visualized subclavian arteries widely patent. Right carotid system: Right common carotid artery tortuous proximally. Right common carotid artery patent to the bifurcation without stenosis. No significant atheromatous plaque about the right bifurcation. Right ICA widely patent distally to the skullbase without stenosis, dissection, or occlusion. Right external carotid artery is branches within normal limits. Left carotid system: Left common carotid artery widely patent to the bifurcation. Minimal centric plaque about the left bifurcation without stenosis. Left ICA widely patent from the bifurcation to the skullbase without stenosis, dissection, or occlusion. Vertebral arteries:Both vertebral arteries arise from the subclavian arteries. Pre foraminal left V1 segment tortuous with multifocal atheromatous irregularity with resultant mild multi focal narrowing. Vertebral arteries otherwise widely patent within the neck without stenosis, dissection, or occlusion. Skeleton: No acute osseous abnormality. No worrisome lytic or blastic osseous lesions. Advanced degenerative spondylolysis noted within cervical spine. Other neck: Visualized lungs are clear. Visualized mediastinum grossly normal. Thyroid within normal limits. No adenopathy within the neck. No acute soft tissue abnormality. CTA HEAD Anterior circulation: Petrous segments widely patent bilaterally. Scattered calcified plaque within the cavernous ICAs with mild diffuse narrowing. A1 segments patent. Anterior communicating artery normal. Anterior cerebral arteries well opacified to their distal aspects. M1 segments widely patent without stenosis or occlusion. MCA bifurcations normal. Focal short-segment severe stenosis proximal right M2 branch, middle division (series 15, image 63). Distal MCA branches well opacified and symmetric. Posterior circulation: Focal plaque at the right V4 segment as it crosses the dural margin with secondary minimal narrowing.  Right vertebral artery otherwise widely patent to the vertebrobasilar junction. Minimal plaque within the left V4 segment without stenosis. Posterior inferior cerebral arteries patent. Basilar artery fenestrated proximally. Basilar artery widely patent to its distal aspect. Superior cerebral arteries patent. Both of the posterior cerebral arteries arise from the basilar artery. Atheromatous irregularity with moderate smooth narrowing of the left P 2 segment. More mild atheromatous irregularity within the right PCA. Venous sinuses: Patent without evidence for venous sinus thrombosis. Anatomic variants: No significant anatomic variant. No aneurysm or vascular malformation. Small focal outpouching arising from the expected location of the takeoff of the left posterior communicating artery favored to reflect a small vascular infundibulum is associated with a hypoplastic left P com. Delayed phase: No pathologic enhancement. IMPRESSION: CTA NECK IMPRESSION: 1. Negative CTA of the neck for patient age. No high-grade or critical stenosis. 2. Mild atheromatous plaque about the left carotid bifurcation without significant stenosis. 3. No significant atheromatous disease within the right carotid artery system. 4. Mild atheromatous plaque within the pre foraminal left V1 segment with resultant mild stenosis. Otherwise widely patent vertebral arteries within the neck. CTA HEAD IMPRESSION: 1. Negative for large vessel occlusion. No high-grade or correctable stenosis. 2. Short-segment severe nonocclusive stenosis proximal right M2 branch as above. 3. Calcified atheromatous plaque within  the cavernous ICAs with mild diffuse narrowing. 4. Mild to moderate multifocal atheromatous irregularity within the PCAs bilaterally, left greater than right. Electronically Signed   By: Rise Mu M.D.   On: 02/02/2016 02:08   Dg Chest 2 View  Result Date: 02/01/2016 CLINICAL DATA:  Stroke-like symptoms. EXAM: CHEST  2 VIEW  COMPARISON:  None. FINDINGS: Cardiomediastinal silhouette is normal. Mediastinal contours appear intact. Torturous atherosclerotic aorta. There is no evidence of focal airspace consolidation, pleural effusion or pneumothorax. Osseous structures are without acute abnormality. Soft tissues are grossly normal. Breast implants are noted. IMPRESSION: No active cardiopulmonary disease. Electronically Signed   By: Ted Mcalpine M.D.   On: 02/01/2016 18:15   Ct Angio Neck W Or Wo Contrast  Result Date: 02/02/2016 CLINICAL DATA:  Initial evaluation for acute stroke. EXAM: CT ANGIOGRAPHY HEAD AND NECK TECHNIQUE: Multidetector CT imaging of the head and neck was performed using the standard protocol during bolus administration of intravenous contrast. Multiplanar CT image reconstructions and MIPs were obtained to evaluate the vascular anatomy. Carotid stenosis measurements (when applicable) are obtained utilizing NASCET criteria, using the distal internal carotid diameter as the denominator. CONTRAST:  100 cc of Isovue 370. COMPARISON:  Prior MRI from earlier the same day. FINDINGS: CT HEAD Scalp soft tissues within normal limits. Globes and orbits unremarkable. Patient status post lens extraction bilaterally. Senescent calcifications noted. Visualized paranasal sinuses are clear. No mastoid effusion. Middle ear cavities are clear. Calvarium intact. Age-related cerebral atrophy with moderate chronic microvascular ischemic disease. Known acute lacunar infarct within the left internal capsule not well seen. No acute intracranial hemorrhage. No other acute large vessel territory infarct. No mass lesion, midline shift, or mass effect. No hydrocephalus. No extra-axial fluid collection. CTA NECK Aortic arch: Visualized aortic arch of normal caliber with normal branch pattern. No high-grade stenosis at the origin of the great vessels. Mild scattered plaque within the arch itself. Visualized subclavian arteries widely  patent. Right carotid system: Right common carotid artery tortuous proximally. Right common carotid artery patent to the bifurcation without stenosis. No significant atheromatous plaque about the right bifurcation. Right ICA widely patent distally to the skullbase without stenosis, dissection, or occlusion. Right external carotid artery is branches within normal limits. Left carotid system: Left common carotid artery widely patent to the bifurcation. Minimal centric plaque about the left bifurcation without stenosis. Left ICA widely patent from the bifurcation to the skullbase without stenosis, dissection, or occlusion. Vertebral arteries:Both vertebral arteries arise from the subclavian arteries. Pre foraminal left V1 segment tortuous with multifocal atheromatous irregularity with resultant mild multi focal narrowing. Vertebral arteries otherwise widely patent within the neck without stenosis, dissection, or occlusion. Skeleton: No acute osseous abnormality. No worrisome lytic or blastic osseous lesions. Advanced degenerative spondylolysis noted within cervical spine. Other neck: Visualized lungs are clear. Visualized mediastinum grossly normal. Thyroid within normal limits. No adenopathy within the neck. No acute soft tissue abnormality. CTA HEAD Anterior circulation: Petrous segments widely patent bilaterally. Scattered calcified plaque within the cavernous ICAs with mild diffuse narrowing. A1 segments patent. Anterior communicating artery normal. Anterior cerebral arteries well opacified to their distal aspects. M1 segments widely patent without stenosis or occlusion. MCA bifurcations normal. Focal short-segment severe stenosis proximal right M2 branch, middle division (series 15, image 63). Distal MCA branches well opacified and symmetric. Posterior circulation: Focal plaque at the right V4 segment as it crosses the dural margin with secondary minimal narrowing. Right vertebral artery otherwise widely patent to  the vertebrobasilar junction. Minimal plaque  within the left V4 segment without stenosis. Posterior inferior cerebral arteries patent. Basilar artery fenestrated proximally. Basilar artery widely patent to its distal aspect. Superior cerebral arteries patent. Both of the posterior cerebral arteries arise from the basilar artery. Atheromatous irregularity with moderate smooth narrowing of the left P 2 segment. More mild atheromatous irregularity within the right PCA. Venous sinuses: Patent without evidence for venous sinus thrombosis. Anatomic variants: No significant anatomic variant. No aneurysm or vascular malformation. Small focal outpouching arising from the expected location of the takeoff of the left posterior communicating artery favored to reflect a small vascular infundibulum is associated with a hypoplastic left P com. Delayed phase: No pathologic enhancement. IMPRESSION: CTA NECK IMPRESSION: 1. Negative CTA of the neck for patient age. No high-grade or critical stenosis. 2. Mild atheromatous plaque about the left carotid bifurcation without significant stenosis. 3. No significant atheromatous disease within the right carotid artery system. 4. Mild atheromatous plaque within the pre foraminal left V1 segment with resultant mild stenosis. Otherwise widely patent vertebral arteries within the neck. CTA HEAD IMPRESSION: 1. Negative for large vessel occlusion. No high-grade or correctable stenosis. 2. Short-segment severe nonocclusive stenosis proximal right M2 branch as above. 3. Calcified atheromatous plaque within the cavernous ICAs with mild diffuse narrowing. 4. Mild to moderate multifocal atheromatous irregularity within the PCAs bilaterally, left greater than right. Electronically Signed   By: Rise Mu M.D.   On: 02/02/2016 02:08   Mr Brain Wo Contrast  Result Date: 02/01/2016 CLINICAL DATA:  Right-sided facial droop. Stroke like symptoms. Slurred speech. Symptoms began at 7:30 a.m. EXAM:  MRI HEAD WITHOUT CONTRAST TECHNIQUE: Multiplanar, multiecho pulse sequences of the brain and surrounding structures were obtained without intravenous contrast. COMPARISON:  CT head without contrast 03/07/2013 FINDINGS: Brain: Acute nonhemorrhagic linear infarct is present within the posterior limb of the left internal capsule measuring up to 14 mm. No other acute infarct is present. Extensive dilated perivascular spaces are present throughout the basal ganglia and thalami. Advanced atrophy and diffuse white matter disease is present bilaterally. The ventricles are proportionate to the degree of atrophy. White matter changes extend into the brainstem. The brainstem and cerebellum are otherwise unremarkable. Internal auditory canals are within normal limits. Vascular: Flow is present in the major intracranial arteries. Skull and upper cervical spine: The skullbase is within normal limits. Midline sagittal images demonstrate an infarct involving the anterior corpus callosum. Midline intracranial structures are otherwise within normal limits. Mild degenerative changes are present in the upper cervical spine, and consistent with a age. Sinuses/Orbits: The paranasal sinuses and mastoid air cells are clear. Bilateral lens replacements are present. The globes and orbits are intact. Other: IMPRESSION: 1. Acute nonhemorrhagic 1.4 cm linear infarct within the posterior limb of the left internal capsule. This corresponds with the patient's right-sided symptoms and a aphasia. 2. Advanced atrophy and diffuse white matter disease likely reflects the sequela of chronic microvascular ischemia without other acute infarct. 3. Dilated perivascular spaces throughout the basal ganglia. These results were called by telephone at the time of interpretation on 02/01/2016 at 3:58 pm to Dr. Rubin Payor, who verbally acknowledged these results. Electronically Signed   By: Marin Roberts M.D.   On: 02/01/2016 16:01    Scheduled Meds: .   stroke: mapping our early stages of recovery book   Does not apply Once  . aspirin  300 mg Rectal Daily   Or  . aspirin  325 mg Oral Daily  . atorvastatin  40 mg Oral q1800  .  heparin  5,000 Units Subcutaneous Q8H  . oxybutynin  5 mg Oral QHS  . pantoprazole  40 mg Oral Daily  . topiramate  25 mg Oral BID   Continuous Infusions:    LOS: 2 days   Time spent: 25 minutes.  Hazeline Junkeryan Grunz, MD [facility] Pager [phone]  If 7PM-7AM, please contact night-coverage [email or web address] Password [email or web address] 02/03/2016, 12:44 PM

## 2016-02-03 NOTE — Evaluation (Signed)
Speech Language Pathology Evaluation Patient Details Name: Kelly [name] MRN: [id number] DOB: 10-03-1927 Today's Date: 02/03/2016 Time: [id number] SLP Time Calculation (min) (ACUTE ONLY): 14 min  Problem List:  Patient Active Problem List   Diagnosis Date Noted  . Hyperlipidemia   . Essential hypertension   . Essential tremor   . Stroke (cerebrum) (HCC) 02/01/2016  . Facial droop due to stroke 02/01/2016  . Essential and other specified forms of tremor 02/20/2013  . Dysphonia 02/20/2013  . Trigeminal neuralgia 02/20/2013   Past Medical History:  Past Medical History:  Diagnosis Date  . Dysphonia 02/20/2013  . Essential and other specified forms of tremor 02/20/2013  . Essential tremor   . Osteoporosis   . Psoriasis    Past Surgical History:  Past Surgical History:  Procedure Laterality Date  . APPENDECTOMY  1940  . bladder tack    . CATARACT EXTRACTION Bilateral   . HEMORRHOID SURGERY  1960  . HUMERUS FRACTURE SURGERY    . LAPAROSCOPIC HYSTERECTOMY  2006  . torn rotator cuff    . WRIST FRACTURE SURGERY  2005   seconday shoulder 2001   HPI:  Kelly [name] is a 82 y.o. female with medical history significant for essential tremor, psoriasis and osteoporosis,, presenting with acute onset f R facial droop since 7:30 without any other focal abnormalities. MRI acute nonhemorrhagic 1.4 cm linear infarct within the posterior limb of the left internal capsule. CXR No active cardiopulmonary disease.   Assessment / Plan / Recommendation Clinical Impression  Pt exhibits mild-moderate dysarthria affecting full intelligibility in conversation primarily in noisy environments. Cognistat standardized assessment revealed average range in all areas. She recalled PA from rehab coming at 6:00 this am. Pt would benefit from continued ST for speech intelligibility; plans are for inpatient rehab.     SLP Assessment  Patient needs continued Speech Lanaguage Pathology Services    Follow Up  Recommendations  Inpatient Rehab    Frequency and Duration min 2x/week  2 weeks      SLP Evaluation Prior Functioning  Cognitive/Linguistic Baseline: Within functional limits Type of Home: House  Lives With: Spouse Available Help at Discharge: Family;Available 24 hours/day   Cognition  Overall Cognitive Status: Within Functional Limits for tasks assessed Arousal/Alertness: Awake/alert Orientation Level: Oriented X4 Attention: Sustained Sustained Attention: Appears intact Memory: Appears intact (10- WNL on Cognistat) Awareness: Appears intact Problem Solving: Appears intact Safety/Judgment: Appears intact    Comprehension  Auditory Comprehension Overall Auditory Comprehension: Appears within functional limits for tasks assessed Visual Recognition/Discrimination Discrimination: Not tested Reading Comprehension Reading Status: Not tested    Expression Expression Primary Mode of Expression: Verbal Verbal Expression Overall Verbal Expression: Appears within functional limits for tasks assessed Initiation: No impairment Level of Generative/Spontaneous Verbalization: Conversation Repetition: No impairment Naming: No impairment Pragmatics: No impairment Written Expression Dominant Hand: Right Written Expression: Not tested   Oral / Motor  Oral Motor/Sensory Function Overall Oral Motor/Sensory Function: Moderate impairment Facial ROM: Reduced right Facial Symmetry: Abnormal symmetry right Facial Strength: Reduced right Facial Sensation: Reduced right Lingual ROM: Reduced right Lingual Symmetry: Abnormal symmetry right Lingual Strength: Reduced Lingual Sensation: Reduced Motor Speech Overall Motor Speech: Impaired Respiration: Within functional limits Phonation: Normal Resonance: Within functional limits Articulation: Impaired Level of Impairment: Conversation Intelligibility: Intelligibility reduced Word: 75-100% accurate Phrase: 75-100% accurate Sentence:  75-100% accurate Conversation: 75-100% accurate Motor Planning: Witnin functional limits   GO  Royce Macadamia 02/03/2016, 10:08 AM   Kelly [name].Ed [employer] [phone]

## 2016-02-03 NOTE — H&P (View-Only) (Signed)
Physical Medicine and Rehabilitation Admission H&P    Chief Complaint  Patient presents with  . Stroke Symptoms  : HPI:  Kelly [name] is a 82 y.o. right handed female with history of essential tremor. Per chart review patient lives with spouse. 2 level home with bedroom on first floor. 2 steps to entry. Used a walker prior to admission. Presented 02/01/2016 with right side weakness as well as slurred speech. MRI of the brain showed acute nonhemorrhagic 1.4 cm linear infarct within the posterior limb of the left internal capsule. CTA of head and neck negative. Patient did not receive TPA. Neurology consulted presently on aspirin for CVA prophylaxis. Echocardiogram With no wall motion abnormalities. Grade 1 diastolic dysfunction. Subcutaneous heparin for DVT prophylaxis. Dysphagia #3 nectar thick liquid diet. Physical and occupational therapy evaluations completed with recommendations of physical medicine rehabilitation consult.Patient was admitted for a comprehensive rehabilitation program  ROS Constitutional: Negative for chills and fever.  HENT: Negative for hearing loss.   Eyes: Negative for blurred vision and double vision.  Respiratory: Negative for cough and shortness of breath.   Cardiovascular: Negative for chest pain, palpitations and leg swelling.  Gastrointestinal: Positive for constipation. Negative for nausea and vomiting.  Genitourinary: Positive for urgency. Negative for dysuria and hematuria.  Musculoskeletal: Positive for falls and myalgias.  Skin: Negative for rash.  Neurological: Positive for tremors, speech change, focal weakness, weakness and headaches. Negative for seizures.  All other systems reviewed and are negative   Past Medical History:  Diagnosis Date  . Dysphonia 02/20/2013  . Essential and other specified forms of tremor 02/20/2013  . Essential tremor   . Osteoporosis   . Psoriasis    Past Surgical History:  Procedure Laterality Date  . APPENDECTOMY   1940  . bladder tack    . CATARACT EXTRACTION Bilateral   . HEMORRHOID SURGERY  1960  . HUMERUS FRACTURE SURGERY    . LAPAROSCOPIC HYSTERECTOMY  2006  . torn rotator cuff    . WRIST FRACTURE SURGERY  2005   seconday shoulder 2001   Family History  Problem Relation Age of Onset  . Arthritis Brother     spine  . Osteoporosis Maternal Grandmother    Social History:  reports that she has never smoked. She has never used smokeless tobacco. She reports that she drinks alcohol. She reports that she does not use drugs. Allergies: No Known Allergies Medications Prior to Admission  Medication Sig Dispense Refill  . alendronate (FOSAMAX) 70 MG tablet Take 70 mg by mouth once a week. Take with a full glass of water on an empty stomach.    . esomeprazole (NEXIUM) 40 MG capsule Take 40 mg by mouth daily at 12 noon.    Marland Kitchen ibuprofen (ADVIL,MOTRIN) 200 MG tablet Take 400 mg by mouth every 6 (six) hours as needed for moderate pain.     Marland Kitchen oxybutynin (DITROPAN) 5 MG tablet Take 5 mg by mouth at bedtime.    . topiramate (TOPAMAX) 25 MG tablet Take 25 mg by mouth 2 (two) times daily.    Marland Kitchen ALPRAZolam (XANAX) 0.25 MG tablet Take 1 tablet (0.25 mg total) by mouth 3 (three) times daily as needed (tremor). (Patient not taking: Reported on 02/01/2016) 30 tablet 1    Home: [place] expects to be discharged to:: Inpatient rehab Living Arrangements: Spouse/significant other Available Help at Discharge: Family, Available 24 hours/day Type of Home: House Home Access: Stairs to enter [employer] of Steps: 2 Home  Layout: Two level, Able to live on main level with bedroom/bathroom Bathroom Shower/Tub: Chiropodist: Standard Home Equipment: Environmental consultant - 4 wheels  Lives With: Spouse   Functional History: Prior Function Level of Independence: Independent with assistive device(s) Comments: Pt had a fall a few weeks ago while she was out dancing with her husband; since  then she has been using a rollator for all mobility. Prior to fall pt was independent with mobility and ADL with use of AD. Has housekeeper that does cleaning every other week.  Functional Status:  Mobility: Bed Mobility Overal bed mobility: Needs Assistance Bed Mobility: Supine to Sit Supine to sit: Min assist, HOB elevated General bed mobility comments: able to transition to left EOB with use of rail and multiple effortful but ineffective scoots to get to edge, needs physical assist (draw sheet) and faciliation with instructional cues to reciprocally scoot Transfers Overall transfer level: Needs assistance Equipment used: 1 person hand held assist Transfers: Sit to/from Stand, Stand Pivot Transfers Sit to Stand: Mod assist Stand pivot transfers: Mod assist General transfer comment: transition to stand with instructional cue to push from bed rather than pull on PT; pt mildly fearful, legs against bed for stability and gradually more unsteady over time; improved on second trial to stand and took pivotal steps to chair with frequent cues to extend R knee prior to loading; physical assit to control speed and direction of descent into sitting. Ambulation/Gait Ambulation/Gait assistance: Max assist Ambulation Distance (Feet): 1 Feet Assistive device: 1 person hand held assist General Gait Details: pregait activity at EOB including supported wt shifting with verbal/tactile cues and facilitation at R knee to promote extension prior to loading; stepping forward/backward with Right slow and effortful; pivotal steps toward impaired side with physical assist/tactile cues to promote R knee extension prior to wt shifting    ADL: ADL Overall ADL's : Needs assistance/impaired Eating/Feeding: Set up, Sitting, With adaptive utensils Eating/Feeding Details (indicate cue type and reason): Set pt up with red foam for increased independence with self feeding. Encouraged use of R hand if possible. Pt able to  demo small bites of applesauce using build up handle on spoon. Sitting supported in chair. Grooming: Minimal assistance, Sitting Upper Body Bathing: Minimal assitance, Sitting Lower Body Bathing: Maximal assistance, Sit to/from stand Upper Body Dressing : Minimal assistance, Sitting Upper Body Dressing Details (indicate cue type and reason): Assist to guide RUE into hospital gown. Lower Body Dressing: Maximal assistance, Sit to/from stand Toilet Transfer: Moderate assistance, Stand-pivot, BSC, RW Toilet Transfer Details (indicate cue type and reason): Simulated by stand pivot from EOB to chair Toileting- Clothing Manipulation and Hygiene: Maximal assistance, Sit to/from stand Functional mobility during ADLs: Moderate assistance, Rolling walker (for stand pivot only) General ADL Comments: Encouraged functional use of RUE as much as possible. Discussed post acute rehab and pt is agreeable; very motivated to return to functional independence. Upon sitting EOB; pt noted to be loosing food/liquid from R side of mouth (RN and SLP notified).   Cognition: Cognition Overall Cognitive Status: Within Functional Limits for tasks assessed Arousal/Alertness: Awake/alert Orientation Level: Oriented X4 Attention: Sustained Sustained Attention: Appears intact Memory: Appears intact (10- WNL on Cognistat) Awareness: Appears intact Problem Solving: Appears intact Safety/Judgment: Appears intact Cognition Arousal/Alertness: Awake/alert Behavior During Therapy: WFL for tasks assessed/performed Overall Cognitive Status: Within Functional Limits for tasks assessed  Physical Exam: Blood pressure (!) 102/57, pulse 83, temperature 98 F (36.7 C), temperature source Oral, resp. rate 18, height 5' (1.524  m), weight 52.2 kg (115 lb), SpO2 99 %. Physical Exam Constitutional: She appears well-developed.  Frail  HENT:  Head: Normocephalic and atraumatic.  Eyes: EOM are normal.  Pupils round and reactive to  light  Neck: Normal range of motion. Neck supple. No thyromegaly present.  Cardiovascular: Normal rate and regular rhythm.   Respiratory: Effort normal and breath sounds normal. No respiratory distress.  GI: Soft. Bowel sounds are normal. She exhibits no distension.  Musculoskeletal: She exhibits no edema or tenderness.  Neurological: She is alert.  Moderate dysarthria.  Patient provides her name, age and date of birth.  Follows simple commands.  Fair awareness of deficits Right facial droop +Resting tremor Sensation nitact to light touch DTRs symmetric Motor: RUE: shoulder abduction, wlbow flexion/extension 1+/5, wrist, hand 0/5 RLE: 4/5 hip flexion, knee extension, 4+/5 ankle dorsi/plantar flexion LUE/LLE: 5/5 proximal to distal  Psychiatric: She has a normal mood and affect. Her behavior is normal Skin: Skin is warm and dry    Results for orders placed or performed during the hospital encounter of 02/01/16 (from the past 48 hour(s))  Ethanol     Status: None   Collection Time: 02/01/16  2:18 PM  Result Value Ref Range   Alcohol, Ethyl (B) <5 <5 mg/dL    Comment:        LOWEST DETECTABLE LIMIT FOR SERUM ALCOHOL IS 5 mg/dL FOR MEDICAL PURPOSES ONLY   Protime-INR     Status: None   Collection Time: 02/01/16  2:18 PM  Result Value Ref Range   Prothrombin Time 13.4 11.4 - 15.2 seconds   INR 1.02   APTT     Status: None   Collection Time: 02/01/16  2:18 PM  Result Value Ref Range   aPTT 30 24 - 36 seconds  CBC     Status: Abnormal   Collection Time: 02/01/16  2:18 PM  Result Value Ref Range   WBC 11.5 (H) 4.0 - 10.5 K/uL   RBC 4.33 3.87 - 5.11 MIL/uL   Hemoglobin 12.4 12.0 - 15.0 g/dL   HCT 39.0 36.0 - 46.0 %   MCV 90.1 78.0 - 100.0 fL   MCH 28.6 26.0 - 34.0 pg   MCHC 31.8 30.0 - 36.0 g/dL   RDW 13.2 11.5 - 15.5 %   Platelets 285 150 - 400 K/uL  Differential     Status: Abnormal   Collection Time: 02/01/16  2:18 PM  Result Value Ref Range   Neutrophils Relative %  74 %   Neutro Abs 8.5 (H) 1.7 - 7.7 K/uL   Lymphocytes Relative 15 %   Lymphs Abs 1.8 0.7 - 4.0 K/uL   Monocytes Relative 9 %   Monocytes Absolute 1.0 0.1 - 1.0 K/uL   Eosinophils Relative 2 %   Eosinophils Absolute 0.2 0.0 - 0.7 K/uL   Basophils Relative 0 %   Basophils Absolute 0.0 0.0 - 0.1 K/uL  Comprehensive metabolic panel     Status: Abnormal   Collection Time: 02/01/16  2:18 PM  Result Value Ref Range   Sodium 138 135 - 145 mmol/L   Potassium 4.1 3.5 - 5.1 mmol/L   Chloride 109 101 - 111 mmol/L   CO2 24 22 - 32 mmol/L   Glucose, Bld 112 (H) 65 - 99 mg/dL   BUN 22 (H) 6 - 20 mg/dL   Creatinine, Ser 0.70 0.44 - 1.00 mg/dL   Calcium 9.0 8.9 - 10.3 mg/dL   Total Protein 6.5 6.5 - 8.1 g/dL  Albumin 3.3 (L) 3.5 - 5.0 g/dL   AST 19 15 - 41 U/L   ALT 13 (L) 14 - 54 U/L   Alkaline Phosphatase 73 38 - 126 U/L   Total Bilirubin 0.3 0.3 - 1.2 mg/dL   GFR calc non Af Amer >60 >60 mL/min   GFR calc Af Amer >60 >60 mL/min    Comment: (NOTE) The eGFR has been calculated using the CKD EPI equation. This calculation has not been validated in all clinical situations. eGFR's persistently <60 mL/min signify possible Chronic Kidney Disease.    Anion gap 5 5 - 15  I-stat troponin, ED (not at [facility], [facility])     Status: None   Collection Time: 02/01/16  2:41 PM  Result Value Ref Range   Troponin i, poc 0.00 0.00 - 0.08 ng/mL   Comment 3            Comment: Due to the release kinetics of cTnI, a negative result within the first hours of the onset of symptoms does not rule out myocardial infarction with certainty. If myocardial infarction is still suspected, repeat the test at appropriate intervals.   I-Stat Chem 8, ED  (not at [facility], [facility])     Status: Abnormal   Collection Time: 02/01/16  2:43 PM  Result Value Ref Range   Sodium 141 135 - 145 mmol/L   Potassium 4.2 3.5 - 5.1 mmol/L   Chloride 106 101 - 111 mmol/L   BUN 24 (H) 6 - 20 mg/dL   Creatinine, Ser 0.70 0.44 - 1.00 mg/dL    Glucose, Bld 111 (H) 65 - 99 mg/dL   Calcium, Ion 1.19 1.15 - 1.40 mmol/L   TCO2 23 0 - 100 mmol/L   Hemoglobin 12.9 12.0 - 15.0 g/dL   HCT 38.0 36.0 - 46.0 %  Urine rapid drug screen (hosp performed)not at [facility]     Status: None   Collection Time: 02/01/16  2:52 PM  Result Value Ref Range   Opiates NONE DETECTED NONE DETECTED   Cocaine NONE DETECTED NONE DETECTED   Benzodiazepines NONE DETECTED NONE DETECTED   Amphetamines NONE DETECTED NONE DETECTED   Tetrahydrocannabinol NONE DETECTED NONE DETECTED   Barbiturates NONE DETECTED NONE DETECTED    Comment:        DRUG SCREEN FOR MEDICAL PURPOSES ONLY.  IF CONFIRMATION IS NEEDED FOR ANY PURPOSE, NOTIFY LAB WITHIN 5 DAYS.        LOWEST DETECTABLE LIMITS FOR URINE DRUG SCREEN Drug Class       Cutoff (ng/mL) Amphetamine      1000 Barbiturate      200 Benzodiazepine   875 Tricyclics       643 Opiates          300 Cocaine          300 THC              50   Urinalysis, Routine w reflex microscopic (not at [facility])     Status: None   Collection Time: 02/01/16  2:52 PM  Result Value Ref Range   Color, Urine YELLOW YELLOW   APPearance CLEAR CLEAR   Specific Gravity, Urine 1.016 1.005 - 1.030   pH 6.0 5.0 - 8.0   Glucose, UA NEGATIVE NEGATIVE mg/dL   Hgb urine dipstick NEGATIVE NEGATIVE   Bilirubin Urine NEGATIVE NEGATIVE   Ketones, ur NEGATIVE NEGATIVE mg/dL   Protein, ur NEGATIVE NEGATIVE mg/dL   Nitrite NEGATIVE NEGATIVE   Leukocytes, UA NEGATIVE NEGATIVE  Comment: MICROSCOPIC NOT DONE ON URINES WITH NEGATIVE PROTEIN, BLOOD, LEUKOCYTES, NITRITE, OR GLUCOSE <1000 mg/dL.  Hemoglobin A1c     Status: Abnormal   Collection Time: 02/02/16  7:45 AM  Result Value Ref Range   Hgb A1c MFr Bld 5.7 (H) 4.8 - 5.6 %    Comment: (NOTE)         Pre-diabetes: 5.7 - 6.4         Diabetes: >6.4         Glycemic control for adults with diabetes: <7.0    Mean Plasma Glucose 117 mg/dL    Comment: (NOTE) Performed At: [facility] [address] [place], [place] [id number] Mila Homer MD [id number]   Lipid panel     Status: Abnormal   Collection Time: 02/02/16  7:45 AM  Result Value Ref Range   Cholesterol 162 0 - 200 mg/dL   Triglycerides 75 <466 mg/dL   HDL 40 (L) >19 mg/dL   Total CHOL/HDL Ratio 4.1 RATIO   VLDL 15 0 - 40 mg/dL   LDL Cholesterol 820 (H) 0 - 99 mg/dL    Comment:        Total Cholesterol/HDL:CHD Risk Coronary Heart Disease Risk Table                     Men   Women  1/2 Average Risk   3.4   3.3  Average Risk       5.0   4.4  2 X Average Risk   9.6   7.1  3 X Average Risk  23.4   11.0        Use the calculated Patient Ratio above and the CHD Risk Table to determine the patient's CHD Risk.        ATP III CLASSIFICATION (LDL):  <100     mg/dL   Optimal  458-001  mg/dL   Near or Above                    Optimal  130-159  mg/dL   Borderline  908-344  mg/dL   High  >932     mg/dL   Very High    Ct Angio Head W Or Wo Contrast  Result Date: 02/02/2016 CLINICAL DATA:  Initial evaluation for acute stroke. EXAM: CT ANGIOGRAPHY HEAD AND NECK TECHNIQUE: Multidetector CT imaging of the head and neck was performed using the standard protocol during bolus administration of intravenous contrast. Multiplanar CT image reconstructions and MIPs were obtained to evaluate the vascular anatomy. Carotid stenosis measurements (when applicable) are obtained utilizing NASCET criteria, using the distal internal carotid diameter as the denominator. CONTRAST:  100 cc of Isovue 370. COMPARISON:  Prior MRI from earlier the same day. FINDINGS: CT HEAD Scalp soft tissues within normal limits. Globes and orbits unremarkable. Patient status post lens extraction bilaterally. Senescent calcifications noted. Visualized paranasal sinuses are clear. No mastoid effusion. Middle ear cavities are clear. Calvarium intact. Age-related cerebral atrophy with moderate chronic microvascular ischemic disease. Known acute  lacunar infarct within the left internal capsule not well seen. No acute intracranial hemorrhage. No other acute large vessel territory infarct. No mass lesion, midline shift, or mass effect. No hydrocephalus. No extra-axial fluid collection. CTA NECK Aortic arch: Visualized aortic arch of normal caliber with normal branch pattern. No high-grade stenosis at the origin of the great vessels. Mild scattered plaque within the arch itself. Visualized subclavian arteries widely patent. Right carotid system: Right common carotid  artery tortuous proximally. Right common carotid artery patent to the bifurcation without stenosis. No significant atheromatous plaque about the right bifurcation. Right ICA widely patent distally to the skullbase without stenosis, dissection, or occlusion. Right external carotid artery is branches within normal limits. Left carotid system: Left common carotid artery widely patent to the bifurcation. Minimal centric plaque about the left bifurcation without stenosis. Left ICA widely patent from the bifurcation to the skullbase without stenosis, dissection, or occlusion. Vertebral arteries:Both vertebral arteries arise from the subclavian arteries. Pre foraminal left V1 segment tortuous with multifocal atheromatous irregularity with resultant mild multi focal narrowing. Vertebral arteries otherwise widely patent within the neck without stenosis, dissection, or occlusion. Skeleton: No acute osseous abnormality. No worrisome lytic or blastic osseous lesions. Advanced degenerative spondylolysis noted within cervical spine. Other neck: Visualized lungs are clear. Visualized mediastinum grossly normal. Thyroid within normal limits. No adenopathy within the neck. No acute soft tissue abnormality. CTA HEAD Anterior circulation: Petrous segments widely patent bilaterally. Scattered calcified plaque within the cavernous ICAs with mild diffuse narrowing. A1 segments patent. Anterior communicating artery  normal. Anterior cerebral arteries well opacified to their distal aspects. M1 segments widely patent without stenosis or occlusion. MCA bifurcations normal. Focal short-segment severe stenosis proximal right M2 branch, middle division (series 15, image 63). Distal MCA branches well opacified and symmetric. Posterior circulation: Focal plaque at the right V4 segment as it crosses the dural margin with secondary minimal narrowing. Right vertebral artery otherwise widely patent to the vertebrobasilar junction. Minimal plaque within the left V4 segment without stenosis. Posterior inferior cerebral arteries patent. Basilar artery fenestrated proximally. Basilar artery widely patent to its distal aspect. Superior cerebral arteries patent. Both of the posterior cerebral arteries arise from the basilar artery. Atheromatous irregularity with moderate smooth narrowing of the left P 2 segment. More mild atheromatous irregularity within the right PCA. Venous sinuses: Patent without evidence for venous sinus thrombosis. Anatomic variants: No significant anatomic variant. No aneurysm or vascular malformation. Small focal outpouching arising from the expected location of the takeoff of the left posterior communicating artery favored to reflect a small vascular infundibulum is associated with a hypoplastic left P com. Delayed phase: No pathologic enhancement. IMPRESSION: CTA NECK IMPRESSION: 1. Negative CTA of the neck for patient age. No high-grade or critical stenosis. 2. Mild atheromatous plaque about the left carotid bifurcation without significant stenosis. 3. No significant atheromatous disease within the right carotid artery system. 4. Mild atheromatous plaque within the pre foraminal left V1 segment with resultant mild stenosis. Otherwise widely patent vertebral arteries within the neck. CTA HEAD IMPRESSION: 1. Negative for large vessel occlusion. No high-grade or correctable stenosis. 2. Short-segment severe nonocclusive  stenosis proximal right M2 branch as above. 3. Calcified atheromatous plaque within the cavernous ICAs with mild diffuse narrowing. 4. Mild to moderate multifocal atheromatous irregularity within the PCAs bilaterally, left greater than right. Electronically Signed   By: Jeannine Boga M.D.   On: 02/02/2016 02:08   Dg Chest 2 View  Result Date: 02/01/2016 CLINICAL DATA:  Stroke-like symptoms. EXAM: CHEST  2 VIEW COMPARISON:  None. FINDINGS: Cardiomediastinal silhouette is normal. Mediastinal contours appear intact. Torturous atherosclerotic aorta. There is no evidence of focal airspace consolidation, pleural effusion or pneumothorax. Osseous structures are without acute abnormality. Soft tissues are grossly normal. Breast implants are noted. IMPRESSION: No active cardiopulmonary disease. Electronically Signed   By: Fidela Salisbury M.D.   On: 02/01/2016 18:15   Ct Angio Neck W Or Wo Contrast  Result Date:  02/02/2016 CLINICAL DATA:  Initial evaluation for acute stroke. EXAM: CT ANGIOGRAPHY HEAD AND NECK TECHNIQUE: Multidetector CT imaging of the head and neck was performed using the standard protocol during bolus administration of intravenous contrast. Multiplanar CT image reconstructions and MIPs were obtained to evaluate the vascular anatomy. Carotid stenosis measurements (when applicable) are obtained utilizing NASCET criteria, using the distal internal carotid diameter as the denominator. CONTRAST:  100 cc of Isovue 370. COMPARISON:  Prior MRI from earlier the same day. FINDINGS: CT HEAD Scalp soft tissues within normal limits. Globes and orbits unremarkable. Patient status post lens extraction bilaterally. Senescent calcifications noted. Visualized paranasal sinuses are clear. No mastoid effusion. Middle ear cavities are clear. Calvarium intact. Age-related cerebral atrophy with moderate chronic microvascular ischemic disease. Known acute lacunar infarct within the left internal capsule not well  seen. No acute intracranial hemorrhage. No other acute large vessel territory infarct. No mass lesion, midline shift, or mass effect. No hydrocephalus. No extra-axial fluid collection. CTA NECK Aortic arch: Visualized aortic arch of normal caliber with normal branch pattern. No high-grade stenosis at the origin of the great vessels. Mild scattered plaque within the arch itself. Visualized subclavian arteries widely patent. Right carotid system: Right common carotid artery tortuous proximally. Right common carotid artery patent to the bifurcation without stenosis. No significant atheromatous plaque about the right bifurcation. Right ICA widely patent distally to the skullbase without stenosis, dissection, or occlusion. Right external carotid artery is branches within normal limits. Left carotid system: Left common carotid artery widely patent to the bifurcation. Minimal centric plaque about the left bifurcation without stenosis. Left ICA widely patent from the bifurcation to the skullbase without stenosis, dissection, or occlusion. Vertebral arteries:Both vertebral arteries arise from the subclavian arteries. Pre foraminal left V1 segment tortuous with multifocal atheromatous irregularity with resultant mild multi focal narrowing. Vertebral arteries otherwise widely patent within the neck without stenosis, dissection, or occlusion. Skeleton: No acute osseous abnormality. No worrisome lytic or blastic osseous lesions. Advanced degenerative spondylolysis noted within cervical spine. Other neck: Visualized lungs are clear. Visualized mediastinum grossly normal. Thyroid within normal limits. No adenopathy within the neck. No acute soft tissue abnormality. CTA HEAD Anterior circulation: Petrous segments widely patent bilaterally. Scattered calcified plaque within the cavernous ICAs with mild diffuse narrowing. A1 segments patent. Anterior communicating artery normal. Anterior cerebral arteries well opacified to their  distal aspects. M1 segments widely patent without stenosis or occlusion. MCA bifurcations normal. Focal short-segment severe stenosis proximal right M2 branch, middle division (series 15, image 63). Distal MCA branches well opacified and symmetric. Posterior circulation: Focal plaque at the right V4 segment as it crosses the dural margin with secondary minimal narrowing. Right vertebral artery otherwise widely patent to the vertebrobasilar junction. Minimal plaque within the left V4 segment without stenosis. Posterior inferior cerebral arteries patent. Basilar artery fenestrated proximally. Basilar artery widely patent to its distal aspect. Superior cerebral arteries patent. Both of the posterior cerebral arteries arise from the basilar artery. Atheromatous irregularity with moderate smooth narrowing of the left P 2 segment. More mild atheromatous irregularity within the right PCA. Venous sinuses: Patent without evidence for venous sinus thrombosis. Anatomic variants: No significant anatomic variant. No aneurysm or vascular malformation. Small focal outpouching arising from the expected location of the takeoff of the left posterior communicating artery favored to reflect a small vascular infundibulum is associated with a hypoplastic left P com. Delayed phase: No pathologic enhancement. IMPRESSION: CTA NECK IMPRESSION: 1. Negative CTA of the neck for patient age. No  high-grade or critical stenosis. 2. Mild atheromatous plaque about the left carotid bifurcation without significant stenosis. 3. No significant atheromatous disease within the right carotid artery system. 4. Mild atheromatous plaque within the pre foraminal left V1 segment with resultant mild stenosis. Otherwise widely patent vertebral arteries within the neck. CTA HEAD IMPRESSION: 1. Negative for large vessel occlusion. No high-grade or correctable stenosis. 2. Short-segment severe nonocclusive stenosis proximal right M2 branch as above. 3. Calcified  atheromatous plaque within the cavernous ICAs with mild diffuse narrowing. 4. Mild to moderate multifocal atheromatous irregularity within the PCAs bilaterally, left greater than right. Electronically Signed   By: Jeannine Boga M.D.   On: 02/02/2016 02:08   Mr Brain Wo Contrast  Result Date: 02/01/2016 CLINICAL DATA:  Right-sided facial droop. Stroke like symptoms. Slurred speech. Symptoms began at 7:30 a.m. EXAM: MRI HEAD WITHOUT CONTRAST TECHNIQUE: Multiplanar, multiecho pulse sequences of the brain and surrounding structures were obtained without intravenous contrast. COMPARISON:  CT head without contrast 03/07/2013 FINDINGS: Brain: Acute nonhemorrhagic linear infarct is present within the posterior limb of the left internal capsule measuring up to 14 mm. No other acute infarct is present. Extensive dilated perivascular spaces are present throughout the basal ganglia and thalami. Advanced atrophy and diffuse white matter disease is present bilaterally. The ventricles are proportionate to the degree of atrophy. White matter changes extend into the brainstem. The brainstem and cerebellum are otherwise unremarkable. Internal auditory canals are within normal limits. Vascular: Flow is present in the major intracranial arteries. Skull and upper cervical spine: The skullbase is within normal limits. Midline sagittal images demonstrate an infarct involving the anterior corpus callosum. Midline intracranial structures are otherwise within normal limits. Mild degenerative changes are present in the upper cervical spine, and consistent with a age. Sinuses/Orbits: The paranasal sinuses and mastoid air cells are clear. Bilateral lens replacements are present. The globes and orbits are intact. Other: IMPRESSION: 1. Acute nonhemorrhagic 1.4 cm linear infarct within the posterior limb of the left internal capsule. This corresponds with the patient's right-sided symptoms and a aphasia. 2. Advanced atrophy and diffuse  white matter disease likely reflects the sequela of chronic microvascular ischemia without other acute infarct. 3. Dilated perivascular spaces throughout the basal ganglia. These results were called by telephone at the time of interpretation on 02/01/2016 at 3:58 pm to Dr. Alvino Chapel, who verbally acknowledged these results. Electronically Signed   By: San Morelle M.D.   On: 02/01/2016 16:01    Medical Problem List and Plan: 1.  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  DVT Prophylaxis/Anticoagulation: Subcutaneous heparin. Monitor platelet counts and any signs of bleeding 3. Pain Management: Tylenol as needed 4. Dysphagia. Dysphagia #3 nectar liquids. Monitor hydration. Follow-up speech therapy 5. Neuropsych: This patient is capable of making decisions on her own behalf. 6. Skin/Wound Care: Routine skin checks 7. Fluids/Electrolytes/Nutrition: Routine I&O with follow-up chemistries 8. Permissive hypertension. 9. Essential tremor. Patient maintained on Topamax per family 31. Hyperlipidemia. Lipitor 11. Overactive bladder. Ditropan 5 mg daily at bedtime. Check PVR 3 12. Leukocytosis: Follow CBC. 13. Prediabetes: monitor CBGs 14. Diastolic dysfunction: Monitor for fluid overload   Post Admission Physician Evaluation: 1. Functional deficits secondary  to linear infarct within the posterior limb of the left internal capsule. 2. Patient is admitted to receive collaborative, interdisciplinary care between the physiatrist, rehab nursing staff, and therapy team. 3. Patient's level of medical complexity and substantial therapy needs in context  of that medical necessity cannot be provided at a lesser intensity of care such as a SNF. 4. Patient has experienced substantial functional loss from his/her baseline which was documented above under the "Functional History" and "Functional Status" headings.  Judging  by the patient's diagnosis, physical exam, and functional history, the patient has potential for functional progress which will result in measurable gains while on inpatient rehab.  These gains will be of substantial and practical use upon discharge  in facilitating mobility and self-care at the household level. 5. Physiatrist will provide 24 hour management of medical needs as well as oversight of the therapy plan/treatment and provide guidance as appropriate regarding the interaction of the two. 6. 24 hour rehab nursing will assist with bladder management, safety, disease management, medication administration and patient education and help integrate therapy concepts, techniques,education, etc. 7. PT will assess and treat for/with: Lower extremity strength, range of motion, stamina, balance, functional mobility, safety, adaptive techniques and equipment, woundcare, coping skills, pain control, education.   Goals are: Min/Mod A. 8. OT will assess and treat for/with: ADL's, functional mobility, safety, upper extremity strength, adaptive techniques and equipment, wound mgt, ego support, and community reintegration.   Goals are: Min/Mod A. Therapy may proceed with showering this patient. 9. SLP will assess and treat for/with: speech, swallowing.  Goals are: Mod I/Ind. 10. Case Management and Social Worker will assess and treat for psychological issues and discharge planning. 11. Team conference will be held weekly to assess progress toward goals and to determine barriers to discharge. 12. Patient will receive at least 3 hours of therapy per day at least 5 days per week. 13. ELOS: 17-20 days.       14. Prognosis:  good  Delice Lesch, MD, Mellody Drown 02/03/2016

## 2016-02-03 NOTE — Progress Notes (Signed)
Speech Language Pathology Treatment: Dysphagia  Patient Details Name: Dan HumphreysBennie H Dicenso MRN: [id number] DOB: 24-Jun-1927 Today's Date: 02/03/2016 Time: [id number] SLP Time Calculation (min) (ACUTE ONLY): 14 min  Assessment / Plan / Recommendation Clinical Impression  Pt recalled precautions independently (check right side oral cavity). Sips thin water consumed throughout session for possible upgrade with and without straw. Straw sips resulted in immediate cough and pt coughing at end of session with cup sips possibly due to attempting to speak immediately following sip or previous thin trials. Tolerated V-8 juice without difficulty. No right sided pocketing. Plans are for CIR. Continue Dys 3, nectar and trials thin with SLP.   HPI HPI: Dan HumphreysBennie H Travelstead is a 82 y.o. female with medical history significant for essential tremor, psoriasis and osteoporosis,, presenting with acute onset f R facial droop since 7:30 without any other focal abnormalities. MRI acute nonhemorrhagic 1.4 cm linear infarct within the posterior limb of the left internal capsule. CXR No active cardiopulmonary disease.      SLP Plan  Continue with current plan of care     Recommendations  Diet recommendations: Dysphagia 3 (mechanical soft);Nectar-thick liquid Liquids provided via: Cup;No straw Medication Administration: Whole meds with puree Supervision: Patient able to self feed Compensations: Slow rate;Small sips/bites;Lingual sweep for clearance of pocketing Postural Changes and/or Swallow Maneuvers: Seated upright 90 degrees             General recommendations: Rehab consult Oral Care Recommendations: Oral care BID Follow up Recommendations: Inpatient Rehab Plan: Continue with current plan of care     GO                Royce MacadamiaLitaker, Kember Boch Willis 02/03/2016, 9:49 AM Breck CoonsLisa Willis Lonell FaceLitaker M.Ed [employer]SLP Pager [phone]

## 2016-02-03 NOTE — Interval H&P Note (Signed)
Kelly [name] was admitted today to Inpatient Rehabilitation with the diagnosis of linear infarct within the posterior limb of the left internal capsule.  The patient's history has been reviewed, patient examined, and there is no change in status.  Patient continues to be appropriate for intensive inpatient rehabilitation.  I have reviewed the patient's chart and labs.  Questions were answered to the patient's satisfaction. The PAPE has been reviewed and assessment remains appropriate.  Ankit Karis Jubanil Patel 02/03/2016, 9:12 PM

## 2016-02-03 NOTE — Progress Notes (Signed)
Patient is discharged from room 5M01 at this time. Transferred to unit 4W10. Alert and in stable condition. IV site and tele d/c'd. Report given to receiving nurse Nita SellsMaryann, RN with all questions answered. Left unit via wheelchair with husband and all belongings at side.

## 2016-02-03 NOTE — Consult Note (Signed)
Physical Medicine and Rehabilitation Consult Reason for Consult: Linear infarct within the posterior limb of the left internal capsule Referring Physician: [facility]   HPI: Kelly [name] is a 82 y.o. right handed female with history of essential tremor. Per chart review patient lives with spouse. 2 level home with bedroom on first floor. 2 steps to entry. Used a walker prior to admission. Presented 02/01/2016 with right side weakness as well as slurred speech. MRI of the brain showed acute nonhemorrhagic 1.4 cm linear infarct within the posterior limb of the left internal capsule. CTA of head and neck negative. Patient did not receive TPA. Neurology consulted presently on aspirin for CVA prophylaxis. Echocardiogram and neuro workup presently ongoing. Subcutaneous heparin for DVT prophylaxis. Dysphagia #3 nectar thick liquid diet. Physical and occupational therapy evaluations completed with recommendations of physical medicine rehabilitation consult.   Review of Systems  Constitutional: Negative for chills and fever.  HENT: Negative for hearing loss.   Eyes: Negative for blurred vision and double vision.  Respiratory: Negative for cough and shortness of breath.   Cardiovascular: Negative for chest pain, palpitations and leg swelling.  Gastrointestinal: Positive for constipation. Negative for nausea and vomiting.  Genitourinary: Positive for urgency. Negative for dysuria and hematuria.  Musculoskeletal: Positive for falls and myalgias.  Skin: Negative for rash.  Neurological: Positive for tremors, speech change, focal weakness, weakness and headaches. Negative for seizures.  All other systems reviewed and are negative.  Past Medical History:  Diagnosis Date  . Dysphonia 02/20/2013  . Essential and other specified forms of tremor 02/20/2013  . Essential tremor   . Osteoporosis   . Psoriasis    Past Surgical History:  Procedure Laterality Date  . APPENDECTOMY  1940  . bladder tack      . CATARACT EXTRACTION Bilateral   . HEMORRHOID SURGERY  1960  . HUMERUS FRACTURE SURGERY    . LAPAROSCOPIC HYSTERECTOMY  2006  . torn rotator cuff    . WRIST FRACTURE SURGERY  2005   seconday shoulder 2001   Family History  Problem Relation Age of Onset  . Arthritis Brother     spine  . Osteoporosis Maternal Grandmother    Social History:  reports that she has never smoked. She has never used smokeless tobacco. She reports that she drinks alcohol. She reports that she does not use drugs. Allergies: No Known Allergies Medications Prior to Admission  Medication Sig Dispense Refill  . alendronate (FOSAMAX) 70 MG tablet Take 70 mg by mouth once a week. Take with a full glass of water on an empty stomach.    . esomeprazole (NEXIUM) 40 MG capsule Take 40 mg by mouth daily at 12 noon.    Marland Kitchen ibuprofen (ADVIL,MOTRIN) 200 MG tablet Take 400 mg by mouth every 6 (six) hours as needed for moderate pain.     Marland Kitchen oxybutynin (DITROPAN) 5 MG tablet Take 5 mg by mouth at bedtime.    . topiramate (TOPAMAX) 25 MG tablet Take 25 mg by mouth 2 (two) times daily.    Marland Kitchen ALPRAZolam (XANAX) 0.25 MG tablet Take 1 tablet (0.25 mg total) by mouth 3 (three) times daily as needed (tremor). (Patient not taking: Reported on 02/01/2016) 30 tablet 1    Home: Home Living Family/patient expects to be discharged to:: Private residence Living Arrangements: Spouse/significant other Available Help at Discharge: Family, Available 24 hours/day Type of Home: House Home Access: Stairs to enter [employer] of Steps: 2 Home Layout: Two level,  Able to live on main level with bedroom/bathroom Bathroom Shower/Tub: Engineer, manufacturing systems: Standard Home Equipment: Environmental consultant - 4 wheels  Functional History: Prior Function Level of Independence: Independent with assistive device(s) Comments: Pt had a fall a few weeks ago while she was out dancing with her husband; since then she has been using a rollator for all  mobility. Prior to fall pt was independent with mobility and ADL with use of AD. Has housekeeper that does cleaning every other week. Functional Status:  Mobility: Bed Mobility Overal bed mobility: Needs Assistance Bed Mobility: Supine to Sit Supine to sit: Min assist, HOB elevated General bed mobility comments: able to transition to left EOB with use of rail and multiple effortful but ineffective scoots to get to edge, needs physical assist (draw sheet) and faciliation with instructional cues to reciprocally scoot Transfers Overall transfer level: Needs assistance Equipment used: 1 person hand held assist Transfers: Sit to/from Stand, Stand Pivot Transfers Sit to Stand: Mod assist Stand pivot transfers: Mod assist General transfer comment: transition to stand with instructional cue to push from bed rather than pull on PT; pt mildly fearful, legs against bed for stability and gradually more unsteady over time; improved on second trial to stand and took pivotal steps to chair with frequent cues to extend R knee prior to loading; physical assit to control speed and direction of descent into sitting. Ambulation/Gait Ambulation/Gait assistance: Max assist Ambulation Distance (Feet): 1 Feet Assistive device: 1 person hand held assist General Gait Details: pregait activity at EOB including supported wt shifting with verbal/tactile cues and facilitation at R knee to promote extension prior to loading; stepping forward/backward with Right slow and effortful; pivotal steps toward impaired side with physical assist/tactile cues to promote R knee extension prior to wt shifting    ADL: ADL Overall ADL's : Needs assistance/impaired Eating/Feeding: Set up, Sitting, With adaptive utensils Eating/Feeding Details (indicate cue type and reason): Set pt up with red foam for increased independence with self feeding. Encouraged use of R hand if possible. Pt able to demo small bites of applesauce using build up  handle on spoon. Sitting supported in chair. Grooming: Minimal assistance, Sitting Upper Body Bathing: Minimal assitance, Sitting Lower Body Bathing: Maximal assistance, Sit to/from stand Upper Body Dressing : Minimal assistance, Sitting Upper Body Dressing Details (indicate cue type and reason): Assist to guide RUE into hospital gown. Lower Body Dressing: Maximal assistance, Sit to/from stand Toilet Transfer: Moderate assistance, Stand-pivot, BSC, RW Toilet Transfer Details (indicate cue type and reason): Simulated by stand pivot from EOB to chair Toileting- Clothing Manipulation and Hygiene: Maximal assistance, Sit to/from stand Functional mobility during ADLs: Moderate assistance, Rolling walker (for stand pivot only) General ADL Comments: Encouraged functional use of RUE as much as possible. Discussed post acute rehab and pt is agreeable; very motivated to return to functional independence. Upon sitting EOB; pt noted to be loosing food/liquid from R side of mouth (RN and SLP notified).   Cognition: Cognition Overall Cognitive Status: Within Functional Limits for tasks assessed Orientation Level: Oriented X4 Cognition Arousal/Alertness: Awake/alert Behavior During Therapy: WFL for tasks assessed/performed Overall Cognitive Status: Within Functional Limits for tasks assessed  Blood pressure 131/74, pulse 68, temperature 98.7 F (37.1 C), temperature source Oral, resp. rate 18, height 5' (1.524 m), weight 52.2 kg (115 lb), SpO2 94 %. Physical Exam  Vitals reviewed. Constitutional: She appears well-developed.  Frail  HENT:  Head: Normocephalic and atraumatic.  Eyes: EOM are normal.  Pupils round and reactive  to light  Neck: Normal range of motion. Neck supple. No thyromegaly present.  Cardiovascular: Normal rate and regular rhythm.   Respiratory: Effort normal and breath sounds normal. No respiratory distress.  GI: Soft. Bowel sounds are normal. She exhibits no distension.    Musculoskeletal: She exhibits no edema or tenderness.  Neurological: She is alert.  Moderate dysarthria.  Patient provides her name, age and date of birth.  Follows simple commands.  Fair awareness of deficits Right facial droop +Resting tremor Sensation nitact to light touch DTRs symmetric Motor: RUE: shoulder abduction, wlbow flexion/extension 1+/5, wrist, hand 0/5 RLE: 4/5 hip flexion, knee extension, 4+/5 ankle dorsi/plantar flexion LUE/LLE: 5/5 proximal to distal  Skin: Skin is warm and dry.  Psychiatric: She has a normal mood and affect. Her behavior is normal.    Results for orders placed or performed during the hospital encounter of 02/01/16 (from the past 24 hour(s))  Lipid panel     Status: Abnormal   Collection Time: 02/02/16  7:45 AM  Result Value Ref Range   Cholesterol 162 0 - 200 mg/dL   Triglycerides 75 <161 mg/dL   HDL 40 (L) >09 mg/dL   Total CHOL/HDL Ratio 4.1 RATIO   VLDL 15 0 - 40 mg/dL   LDL Cholesterol 604 (H) 0 - 99 mg/dL   Ct Angio Head W Or Wo Contrast  Result Date: 02/02/2016 CLINICAL DATA:  Initial evaluation for acute stroke. EXAM: CT ANGIOGRAPHY HEAD AND NECK TECHNIQUE: Multidetector CT imaging of the head and neck was performed using the standard protocol during bolus administration of intravenous contrast. Multiplanar CT image reconstructions and MIPs were obtained to evaluate the vascular anatomy. Carotid stenosis measurements (when applicable) are obtained utilizing NASCET criteria, using the distal internal carotid diameter as the denominator. CONTRAST:  100 cc of Isovue 370. COMPARISON:  Prior MRI from earlier the same day. FINDINGS: CT HEAD Scalp soft tissues within normal limits. Globes and orbits unremarkable. Patient status post lens extraction bilaterally. Senescent calcifications noted. Visualized paranasal sinuses are clear. No mastoid effusion. Middle ear cavities are clear. Calvarium intact. Age-related cerebral atrophy with moderate  chronic microvascular ischemic disease. Known acute lacunar infarct within the left internal capsule not well seen. No acute intracranial hemorrhage. No other acute large vessel territory infarct. No mass lesion, midline shift, or mass effect. No hydrocephalus. No extra-axial fluid collection. CTA NECK Aortic arch: Visualized aortic arch of normal caliber with normal branch pattern. No high-grade stenosis at the origin of the great vessels. Mild scattered plaque within the arch itself. Visualized subclavian arteries widely patent. Right carotid system: Right common carotid artery tortuous proximally. Right common carotid artery patent to the bifurcation without stenosis. No significant atheromatous plaque about the right bifurcation. Right ICA widely patent distally to the skullbase without stenosis, dissection, or occlusion. Right external carotid artery is branches within normal limits. Left carotid system: Left common carotid artery widely patent to the bifurcation. Minimal centric plaque about the left bifurcation without stenosis. Left ICA widely patent from the bifurcation to the skullbase without stenosis, dissection, or occlusion. Vertebral arteries:Both vertebral arteries arise from the subclavian arteries. Pre foraminal left V1 segment tortuous with multifocal atheromatous irregularity with resultant mild multi focal narrowing. Vertebral arteries otherwise widely patent within the neck without stenosis, dissection, or occlusion. Skeleton: No acute osseous abnormality. No worrisome lytic or blastic osseous lesions. Advanced degenerative spondylolysis noted within cervical spine. Other neck: Visualized lungs are clear. Visualized mediastinum grossly normal. Thyroid within normal limits. No adenopathy within  the neck. No acute soft tissue abnormality. CTA HEAD Anterior circulation: Petrous segments widely patent bilaterally. Scattered calcified plaque within the cavernous ICAs with mild diffuse narrowing. A1  segments patent. Anterior communicating artery normal. Anterior cerebral arteries well opacified to their distal aspects. M1 segments widely patent without stenosis or occlusion. MCA bifurcations normal. Focal short-segment severe stenosis proximal right M2 branch, middle division (series 15, image 63). Distal MCA branches well opacified and symmetric. Posterior circulation: Focal plaque at the right V4 segment as it crosses the dural margin with secondary minimal narrowing. Right vertebral artery otherwise widely patent to the vertebrobasilar junction. Minimal plaque within the left V4 segment without stenosis. Posterior inferior cerebral arteries patent. Basilar artery fenestrated proximally. Basilar artery widely patent to its distal aspect. Superior cerebral arteries patent. Both of the posterior cerebral arteries arise from the basilar artery. Atheromatous irregularity with moderate smooth narrowing of the left P 2 segment. More mild atheromatous irregularity within the right PCA. Venous sinuses: Patent without evidence for venous sinus thrombosis. Anatomic variants: No significant anatomic variant. No aneurysm or vascular malformation. Small focal outpouching arising from the expected location of the takeoff of the left posterior communicating artery favored to reflect a small vascular infundibulum is associated with a hypoplastic left P com. Delayed phase: No pathologic enhancement. IMPRESSION: CTA NECK IMPRESSION: 1. Negative CTA of the neck for patient age. No high-grade or critical stenosis. 2. Mild atheromatous plaque about the left carotid bifurcation without significant stenosis. 3. No significant atheromatous disease within the right carotid artery system. 4. Mild atheromatous plaque within the pre foraminal left V1 segment with resultant mild stenosis. Otherwise widely patent vertebral arteries within the neck. CTA HEAD IMPRESSION: 1. Negative for large vessel occlusion. No high-grade or correctable  stenosis. 2. Short-segment severe nonocclusive stenosis proximal right M2 branch as above. 3. Calcified atheromatous plaque within the cavernous ICAs with mild diffuse narrowing. 4. Mild to moderate multifocal atheromatous irregularity within the PCAs bilaterally, left greater than right. Electronically Signed   By: Rise Mu M.D.   On: 02/02/2016 02:08   Dg Chest 2 View  Result Date: 02/01/2016 CLINICAL DATA:  Stroke-like symptoms. EXAM: CHEST  2 VIEW COMPARISON:  None. FINDINGS: Cardiomediastinal silhouette is normal. Mediastinal contours appear intact. Torturous atherosclerotic aorta. There is no evidence of focal airspace consolidation, pleural effusion or pneumothorax. Osseous structures are without acute abnormality. Soft tissues are grossly normal. Breast implants are noted. IMPRESSION: No active cardiopulmonary disease. Electronically Signed   By: Ted Mcalpine M.D.   On: 02/01/2016 18:15   Ct Angio Neck W Or Wo Contrast  Result Date: 02/02/2016 CLINICAL DATA:  Initial evaluation for acute stroke. EXAM: CT ANGIOGRAPHY HEAD AND NECK TECHNIQUE: Multidetector CT imaging of the head and neck was performed using the standard protocol during bolus administration of intravenous contrast. Multiplanar CT image reconstructions and MIPs were obtained to evaluate the vascular anatomy. Carotid stenosis measurements (when applicable) are obtained utilizing NASCET criteria, using the distal internal carotid diameter as the denominator. CONTRAST:  100 cc of Isovue 370. COMPARISON:  Prior MRI from earlier the same day. FINDINGS: CT HEAD Scalp soft tissues within normal limits. Globes and orbits unremarkable. Patient status post lens extraction bilaterally. Senescent calcifications noted. Visualized paranasal sinuses are clear. No mastoid effusion. Middle ear cavities are clear. Calvarium intact. Age-related cerebral atrophy with moderate chronic microvascular ischemic disease. Known acute lacunar  infarct within the left internal capsule not well seen. No acute intracranial hemorrhage. No other acute large vessel territory  infarct. No mass lesion, midline shift, or mass effect. No hydrocephalus. No extra-axial fluid collection. CTA NECK Aortic arch: Visualized aortic arch of normal caliber with normal branch pattern. No high-grade stenosis at the origin of the great vessels. Mild scattered plaque within the arch itself. Visualized subclavian arteries widely patent. Right carotid system: Right common carotid artery tortuous proximally. Right common carotid artery patent to the bifurcation without stenosis. No significant atheromatous plaque about the right bifurcation. Right ICA widely patent distally to the skullbase without stenosis, dissection, or occlusion. Right external carotid artery is branches within normal limits. Left carotid system: Left common carotid artery widely patent to the bifurcation. Minimal centric plaque about the left bifurcation without stenosis. Left ICA widely patent from the bifurcation to the skullbase without stenosis, dissection, or occlusion. Vertebral arteries:Both vertebral arteries arise from the subclavian arteries. Pre foraminal left V1 segment tortuous with multifocal atheromatous irregularity with resultant mild multi focal narrowing. Vertebral arteries otherwise widely patent within the neck without stenosis, dissection, or occlusion. Skeleton: No acute osseous abnormality. No worrisome lytic or blastic osseous lesions. Advanced degenerative spondylolysis noted within cervical spine. Other neck: Visualized lungs are clear. Visualized mediastinum grossly normal. Thyroid within normal limits. No adenopathy within the neck. No acute soft tissue abnormality. CTA HEAD Anterior circulation: Petrous segments widely patent bilaterally. Scattered calcified plaque within the cavernous ICAs with mild diffuse narrowing. A1 segments patent. Anterior communicating artery normal.  Anterior cerebral arteries well opacified to their distal aspects. M1 segments widely patent without stenosis or occlusion. MCA bifurcations normal. Focal short-segment severe stenosis proximal right M2 branch, middle division (series 15, image 63). Distal MCA branches well opacified and symmetric. Posterior circulation: Focal plaque at the right V4 segment as it crosses the dural margin with secondary minimal narrowing. Right vertebral artery otherwise widely patent to the vertebrobasilar junction. Minimal plaque within the left V4 segment without stenosis. Posterior inferior cerebral arteries patent. Basilar artery fenestrated proximally. Basilar artery widely patent to its distal aspect. Superior cerebral arteries patent. Both of the posterior cerebral arteries arise from the basilar artery. Atheromatous irregularity with moderate smooth narrowing of the left P 2 segment. More mild atheromatous irregularity within the right PCA. Venous sinuses: Patent without evidence for venous sinus thrombosis. Anatomic variants: No significant anatomic variant. No aneurysm or vascular malformation. Small focal outpouching arising from the expected location of the takeoff of the left posterior communicating artery favored to reflect a small vascular infundibulum is associated with a hypoplastic left P com. Delayed phase: No pathologic enhancement. IMPRESSION: CTA NECK IMPRESSION: 1. Negative CTA of the neck for patient age. No high-grade or critical stenosis. 2. Mild atheromatous plaque about the left carotid bifurcation without significant stenosis. 3. No significant atheromatous disease within the right carotid artery system. 4. Mild atheromatous plaque within the pre foraminal left V1 segment with resultant mild stenosis. Otherwise widely patent vertebral arteries within the neck. CTA HEAD IMPRESSION: 1. Negative for large vessel occlusion. No high-grade or correctable stenosis. 2. Short-segment severe nonocclusive stenosis  proximal right M2 branch as above. 3. Calcified atheromatous plaque within the cavernous ICAs with mild diffuse narrowing. 4. Mild to moderate multifocal atheromatous irregularity within the PCAs bilaterally, left greater than right. Electronically Signed   By: Rise Mu M.D.   On: 02/02/2016 02:08   Mr Brain Wo Contrast  Result Date: 02/01/2016 CLINICAL DATA:  Right-sided facial droop. Stroke like symptoms. Slurred speech. Symptoms began at 7:30 a.m. EXAM: MRI HEAD WITHOUT CONTRAST TECHNIQUE: Multiplanar, multiecho pulse sequences  of the brain and surrounding structures were obtained without intravenous contrast. COMPARISON:  CT head without contrast 03/07/2013 FINDINGS: Brain: Acute nonhemorrhagic linear infarct is present within the posterior limb of the left internal capsule measuring up to 14 mm. No other acute infarct is present. Extensive dilated perivascular spaces are present throughout the basal ganglia and thalami. Advanced atrophy and diffuse white matter disease is present bilaterally. The ventricles are proportionate to the degree of atrophy. White matter changes extend into the brainstem. The brainstem and cerebellum are otherwise unremarkable. Internal auditory canals are within normal limits. Vascular: Flow is present in the major intracranial arteries. Skull and upper cervical spine: The skullbase is within normal limits. Midline sagittal images demonstrate an infarct involving the anterior corpus callosum. Midline intracranial structures are otherwise within normal limits. Mild degenerative changes are present in the upper cervical spine, and consistent with a age. Sinuses/Orbits: The paranasal sinuses and mastoid air cells are clear. Bilateral lens replacements are present. The globes and orbits are intact. Other: IMPRESSION: 1. Acute nonhemorrhagic 1.4 cm linear infarct within the posterior limb of the left internal capsule. This corresponds with the patient's right-sided symptoms  and a aphasia. 2. Advanced atrophy and diffuse white matter disease likely reflects the sequela of chronic microvascular ischemia without other acute infarct. 3. Dilated perivascular spaces throughout the basal ganglia. These results were called by telephone at the time of interpretation on 02/01/2016 at 3:58 pm to Dr. Rubin Payor, who verbally acknowledged these results. Electronically Signed   By: Marin Roberts M.D.   On: 02/01/2016 16:01    Assessment/Plan: Diagnosis: Linear infarct within the posterior limb of the left internal capsule Labs and images independently reviewed.  Records reviewed and summated above. Stroke: Continue secondary stroke prophylaxis and Risk Factor Modification listed below:   Antiplatelet therapy:   Blood Pressure Management:  Continue current medication with prn's with permisive HTN per primary team Statin Agent:   Prediabetes management:   Right sided hemiparesis: fit for orthosis to prevent contractures (resting hand splint for day, wrist cock up splint at night, PRAFO, etc) Motor recovery: Fluoxetine   1. Does the need for close, 24 hr/day medical supervision in concert with the patient's rehab needs make it unreasonable for this patient to be served in a less intensive setting? Yes 2. Co-Morbidities requiring supervision/potential complications: essential tremor (consider medications), Dysphagia  (cont SLP, advance diet as tolerated), leukocytosis (cont to monitor for signs and symptoms of infection, further workup if indicated) 3. Due to bladder management, safety, disease management, medication administration and patient education, does the patient require 24 hr/day rehab nursing? Yes 4. Does the patient require coordinated care of a physician, rehab nurse, PT (1-2 hrs/day, 5 days/week), OT (1-2 hrs/day, 5 days/week) and SLP (1-2 hrs/day, 5 days/week) to address physical and functional deficits in the context of the above medical diagnosis(es)?  Yes Addressing deficits in the following areas: balance, endurance, locomotion, strength, transferring, bowel/bladder control, dressing, toileting, speech, swallowing and psychosocial support 5. Can the patient actively participate in an intensive therapy program of at least 3 hrs of therapy per day at least 5 days per week? Yes 6. The potential for patient to make measurable gains while on inpatient rehab is excellent 7. Anticipated functional outcomes upon discharge from inpatient rehab are min assist and mod assist  with PT, min assist and mod assist with OT, independent and modified independent with SLP. 8. Estimated rehab length of stay to reach the above functional goals is: 17-20 days. 9.  Does the patient have adequate social supports and living environment to accommodate these discharge functional goals? Potentially 10. Anticipated D/C setting: Home 11. Anticipated post D/C treatments: HH therapy and Home excercise program 12. Overall Rehab/Functional Prognosis: good  RECOMMENDATIONS: This patient's condition is appropriate for continued rehabilitative care in the following setting: Likely CIR after completion of medical workup.  Patient has agreed to participate in recommended program. Yes Note that insurance prior authorization may be required for reimbursement for recommended care.  Comment: Rehab Admissions Coordinator to follow up.  Maryla MorrowAnkit Patel, MD, [place] [facility]AP[facility] 02/03/2016

## 2016-02-03 NOTE — Progress Notes (Signed)
  Echocardiogram 2D Echocardiogram has been performed.  Cathie BeamsGREGORY, Sundee Garland 02/03/2016, 11:32 AM

## 2016-02-04 ENCOUNTER — Inpatient Hospital Stay (HOSPITAL_COMMUNITY): Payer: Medicare Other | Admitting: Occupational Therapy

## 2016-02-04 ENCOUNTER — Inpatient Hospital Stay (HOSPITAL_COMMUNITY): Payer: Medicare Other | Admitting: Speech Pathology

## 2016-02-04 ENCOUNTER — Inpatient Hospital Stay (HOSPITAL_COMMUNITY): Payer: Medicare Other | Admitting: Physical Therapy

## 2016-02-04 DIAGNOSIS — G250 Essential tremor: Secondary | ICD-10-CM

## 2016-02-04 DIAGNOSIS — I69322 Dysarthria following cerebral infarction: Secondary | ICD-10-CM

## 2016-02-04 DIAGNOSIS — I63311 Cerebral infarction due to thrombosis of right middle cerebral artery: Secondary | ICD-10-CM

## 2016-02-04 DIAGNOSIS — I69391 Dysphagia following cerebral infarction: Secondary | ICD-10-CM

## 2016-02-04 DIAGNOSIS — R7303 Prediabetes: Secondary | ICD-10-CM

## 2016-02-04 DIAGNOSIS — G8191 Hemiplegia, unspecified affecting right dominant side: Secondary | ICD-10-CM

## 2016-02-04 LAB — CBC WITH DIFFERENTIAL/PLATELET
Basophils Absolute: 0.1 10*3/uL (ref 0.0–0.1)
Basophils Relative: 1 %
Eosinophils Absolute: 0.3 10*3/uL (ref 0.0–0.7)
Eosinophils Relative: 3 %
HCT: 41 % (ref 36.0–46.0)
Hemoglobin: 13 g/dL (ref 12.0–15.0)
Lymphocytes Relative: 27 %
Lymphs Abs: 2.3 10*3/uL (ref 0.7–4.0)
MCH: 28.7 pg (ref 26.0–34.0)
MCHC: 31.7 g/dL (ref 30.0–36.0)
MCV: 90.5 fL (ref 78.0–100.0)
Monocytes Absolute: 0.9 10*3/uL (ref 0.1–1.0)
Monocytes Relative: 11 %
Neutro Abs: 4.9 10*3/uL (ref 1.7–7.7)
Neutrophils Relative %: 58 %
Platelets: 295 10*3/uL (ref 150–400)
RBC: 4.53 MIL/uL (ref 3.87–5.11)
RDW: 13.3 % (ref 11.5–15.5)
WBC: 8.4 10*3/uL (ref 4.0–10.5)

## 2016-02-04 LAB — COMPREHENSIVE METABOLIC PANEL
ALT: 14 U/L (ref 14–54)
AST: 19 U/L (ref 15–41)
Albumin: 3.5 g/dL (ref 3.5–5.0)
Alkaline Phosphatase: 74 U/L (ref 38–126)
Anion gap: 6 (ref 5–15)
BUN: 17 mg/dL (ref 6–20)
CO2: 25 mmol/L (ref 22–32)
Calcium: 9 mg/dL (ref 8.9–10.3)
Chloride: 110 mmol/L (ref 101–111)
Creatinine, Ser: 0.73 mg/dL (ref 0.44–1.00)
GFR calc Af Amer: >60 mL/min (ref >60)
GFR calc non Af Amer: >60 mL/min (ref >60)
Glucose, Bld: 96 mg/dL (ref 65–99)
Potassium: 4 mmol/L (ref 3.5–5.1)
Sodium: 141 mmol/L (ref 135–145)
Total Bilirubin: 0.5 mg/dL (ref 0.3–1.2)
Total Protein: 6.5 g/dL (ref 6.5–8.1)

## 2016-02-04 MED ORDER — FLUOXETINE HCL 10 MG PO CAPS
10.0000 mg | ORAL_CAPSULE | Freq: Every day | ORAL | Status: DC
  Administered 2016-02-04 – 2016-02-11 (×8): 10 mg via ORAL
  Filled 2016-02-04 (×8): qty 1

## 2016-02-04 NOTE — Progress Notes (Signed)
[name], MD Physician Signed Physical Medicine and Rehabilitation  Consult Note Date of Service: 02/03/2016 6:02 AM  Related encounter: ED to Hosp-Admission (Discharged) from 02/01/2016 in [facility] 1M NEURO MEDICAL     Expand All Collapse All   [] Hide copied text [] Hover for attribution information      Physical Medicine and Rehabilitation Consult Reason for Consult: Linear infarct within the posterior limb of the left internal capsule Referring Physician: [facility]   HPI: Kelly [name] is a 82 y.o. right handed female with history of essential tremor. Per chart review patient lives with spouse. 2 level home with bedroom on first floor. 2 steps to entry. Used a walker prior to admission. Presented 02/01/2016 with right side weakness as well as slurred speech. MRI of the brain showed acute nonhemorrhagic 1.4 cm linear infarct within the posterior limb of the left internal capsule. CTA of head and neck negative. Patient did not receive TPA. Neurology consulted presently on aspirin for CVA prophylaxis. Echocardiogram and neuro workup presently ongoing. Subcutaneous heparin for DVT prophylaxis. Dysphagia #3 nectar thick liquid diet. Physical and occupational therapy evaluations completed with recommendations of physical medicine rehabilitation consult.   Review of Systems  Constitutional: Negative for chills and fever.  HENT: Negative for hearing loss.   Eyes: Negative for blurred vision and double vision.  Respiratory: Negative for cough and shortness of breath.   Cardiovascular: Negative for chest pain, palpitations and leg swelling.  Gastrointestinal: Positive for constipation. Negative for nausea and vomiting.  Genitourinary: Positive for urgency. Negative for dysuria and hematuria.  Musculoskeletal: Positive for falls and myalgias.  Skin: Negative for rash.  Neurological: Positive for tremors, speech change, focal weakness, weakness and headaches.  Negative for seizures.  All other systems reviewed and are negative.  Past Medical History:  Diagnosis Date  . Dysphonia 02/20/2013  . Essential and other specified forms of tremor 02/20/2013  . Essential tremor   . Osteoporosis   . Psoriasis         Past Surgical History:  Procedure Laterality Date  . APPENDECTOMY  1940  . bladder tack    . CATARACT EXTRACTION Bilateral   . HEMORRHOID SURGERY  1960  . HUMERUS FRACTURE SURGERY    . LAPAROSCOPIC HYSTERECTOMY  2006  . torn rotator cuff    . WRIST FRACTURE SURGERY  2005   seconday shoulder 2001         Family History  Problem Relation Age of Onset  . Arthritis Brother     spine  . Osteoporosis Maternal Grandmother    Social History:  reports that she has never smoked. She has never used smokeless tobacco. She reports that she drinks alcohol. She reports that she does not use drugs. Allergies: No Known Allergies       Medications Prior to Admission  Medication Sig Dispense Refill  . alendronate (FOSAMAX) 70 MG tablet Take 70 mg by mouth once a week. Take with a full glass of water on an empty stomach.    . esomeprazole (NEXIUM) 40 MG capsule Take 40 mg by mouth daily at 12 noon.    Marland Kitchen ibuprofen (ADVIL,MOTRIN) 200 MG tablet Take 400 mg by mouth every 6 (six) hours as needed for moderate pain.     Marland Kitchen oxybutynin (DITROPAN) 5 MG tablet Take 5 mg by mouth at bedtime.    . topiramate (TOPAMAX) 25 MG tablet Take 25 mg by mouth 2 (two) times daily.    Marland Kitchen ALPRAZolam (XANAX) 0.25  MG tablet Take 1 tablet (0.25 mg total) by mouth 3 (three) times daily as needed (tremor). (Patient not taking: Reported on 02/01/2016) 30 tablet 1    Home: Home Living Family/patient expects to be discharged to:: Private residence Living Arrangements: Spouse/significant other Available Help at Discharge: Family, Available 24 hours/day Type of Home: House Home Access: Stairs to enter [employer] of Steps: 2 Home  Layout: Two level, Able to live on main level with bedroom/bathroom Bathroom Shower/Tub: Engineer, manufacturing systems: Standard Home Equipment: Environmental consultant - 4 wheels  Functional History: Prior Function Level of Independence: Independent with assistive device(s) Comments: Pt had a fall a few weeks ago while she was out dancing with her husband; since then she has been using a rollator for all mobility. Prior to fall pt was independent with mobility and ADL with use of AD. Has housekeeper that does cleaning every other week. Functional Status:  Mobility: Bed Mobility Overal bed mobility: Needs Assistance Bed Mobility: Supine to Sit Supine to sit: Min assist, HOB elevated General bed mobility comments: able to transition to left EOB with use of rail and multiple effortful but ineffective scoots to get to edge, needs physical assist (draw sheet) and faciliation with instructional cues to reciprocally scoot Transfers Overall transfer level: Needs assistance Equipment used: 1 person hand held assist Transfers: Sit to/from Stand, Stand Pivot Transfers Sit to Stand: Mod assist Stand pivot transfers: Mod assist General transfer comment: transition to stand with instructional cue to push from bed rather than pull on PT; pt mildly fearful, legs against bed for stability and gradually more unsteady over time; improved on second trial to stand and took pivotal steps to chair with frequent cues to extend R knee prior to loading; physical assit to control speed and direction of descent into sitting. Ambulation/Gait Ambulation/Gait assistance: Max assist Ambulation Distance (Feet): 1 Feet Assistive device: 1 person hand held assist General Gait Details: pregait activity at EOB including supported wt shifting with verbal/tactile cues and facilitation at R knee to promote extension prior to loading; stepping forward/backward with Right slow and effortful; pivotal steps toward impaired side with physical  assist/tactile cues to promote R knee extension prior to wt shifting    ADL: ADL Overall ADL's : Needs assistance/impaired Eating/Feeding: Set up, Sitting, With adaptive utensils Eating/Feeding Details (indicate cue type and reason): Set pt up with red foam for increased independence with self feeding. Encouraged use of R hand if possible. Pt able to demo small bites of applesauce using build up handle on spoon. Sitting supported in chair. Grooming: Minimal assistance, Sitting Upper Body Bathing: Minimal assitance, Sitting Lower Body Bathing: Maximal assistance, Sit to/from stand Upper Body Dressing : Minimal assistance, Sitting Upper Body Dressing Details (indicate cue type and reason): Assist to guide RUE into hospital gown. Lower Body Dressing: Maximal assistance, Sit to/from stand Toilet Transfer: Moderate assistance, Stand-pivot, BSC, RW Toilet Transfer Details (indicate cue type and reason): Simulated by stand pivot from EOB to chair Toileting- Clothing Manipulation and Hygiene: Maximal assistance, Sit to/from stand Functional mobility during ADLs: Moderate assistance, Rolling walker (for stand pivot only) General ADL Comments: Encouraged functional use of RUE as much as possible. Discussed post acute rehab and pt is agreeable; very motivated to return to functional independence. Upon sitting EOB; pt noted to be loosing food/liquid from R side of mouth (RN and SLP notified).   Cognition: Cognition Overall Cognitive Status: Within Functional Limits for tasks assessed Orientation Level: Oriented X4 Cognition Arousal/Alertness: Awake/alert Behavior During Therapy:  WFL for tasks assessed/performed Overall Cognitive Status: Within Functional Limits for tasks assessed  Blood pressure 131/74, pulse 68, temperature 98.7 F (37.1 C), temperature source Oral, resp. rate 18, height 5' (1.524 m), weight 52.2 kg (115 lb), SpO2 94 %. Physical Exam  Vitals reviewed. Constitutional: She  appears well-developed.  Frail  HENT:  Head: Normocephalic and atraumatic.  Eyes: EOM are normal.  Pupils round and reactive to light  Neck: Normal range of motion. Neck supple. No thyromegaly present.  Cardiovascular: Normal rate and regular rhythm.   Respiratory: Effort normal and breath sounds normal. No respiratory distress.  GI: Soft. Bowel sounds are normal. She exhibits no distension.  Musculoskeletal: She exhibits no edema or tenderness.  Neurological: She is alert.  Moderate dysarthria.  Patient provides her name, age and date of birth.  Follows simple commands.  Fair awareness of deficits Right facial droop +Resting tremor Sensation nitact to light touch DTRs symmetric Motor: RUE: shoulder abduction, wlbow flexion/extension 1+/5, wrist, hand 0/5 RLE: 4/5 hip flexion, knee extension, 4+/5 ankle dorsi/plantar flexion LUE/LLE: 5/5 proximal to distal  Skin: Skin is warm and dry.  Psychiatric: She has a normal mood and affect. Her behavior is normal.    Lab Results Last 24 Hours       Results for orders placed or performed during the hospital encounter of 02/01/16 (from the past 24 hour(s))  Lipid panel     Status: Abnormal   Collection Time: 02/02/16  7:45 AM  Result Value Ref Range   Cholesterol 162 0 - 200 mg/dL   Triglycerides 75 <536 mg/dL   HDL 40 (L) >64 mg/dL   Total CHOL/HDL Ratio 4.1 RATIO   VLDL 15 0 - 40 mg/dL   LDL Cholesterol 403 (H) 0 - 99 mg/dL      Imaging Results (Last 48 hours)  Ct Angio Head W Or Wo Contrast  Result Date: 02/02/2016 CLINICAL DATA:  Initial evaluation for acute stroke. EXAM: CT ANGIOGRAPHY HEAD AND NECK TECHNIQUE: Multidetector CT imaging of the head and neck was performed using the standard protocol during bolus administration of intravenous contrast. Multiplanar CT image reconstructions and MIPs were obtained to evaluate the vascular anatomy. Carotid stenosis measurements (when applicable) are obtained utilizing NASCET  criteria, using the distal internal carotid diameter as the denominator. CONTRAST:  100 cc of Isovue 370. COMPARISON:  Prior MRI from earlier the same day. FINDINGS: CT HEAD Scalp soft tissues within normal limits. Globes and orbits unremarkable. Patient status post lens extraction bilaterally. Senescent calcifications noted. Visualized paranasal sinuses are clear. No mastoid effusion. Middle ear cavities are clear. Calvarium intact. Age-related cerebral atrophy with moderate chronic microvascular ischemic disease. Known acute lacunar infarct within the left internal capsule not well seen. No acute intracranial hemorrhage. No other acute large vessel territory infarct. No mass lesion, midline shift, or mass effect. No hydrocephalus. No extra-axial fluid collection. CTA NECK Aortic arch: Visualized aortic arch of normal caliber with normal branch pattern. No high-grade stenosis at the origin of the great vessels. Mild scattered plaque within the arch itself. Visualized subclavian arteries widely patent. Right carotid system: Right common carotid artery tortuous proximally. Right common carotid artery patent to the bifurcation without stenosis. No significant atheromatous plaque about the right bifurcation. Right ICA widely patent distally to the skullbase without stenosis, dissection, or occlusion. Right external carotid artery is branches within normal limits. Left carotid system: Left common carotid artery widely patent to the bifurcation. Minimal centric plaque about the left bifurcation without stenosis.  Left ICA widely patent from the bifurcation to the skullbase without stenosis, dissection, or occlusion. Vertebral arteries:Both vertebral arteries arise from the subclavian arteries. Pre foraminal left V1 segment tortuous with multifocal atheromatous irregularity with resultant mild multi focal narrowing. Vertebral arteries otherwise widely patent within the neck without stenosis, dissection, or occlusion.  Skeleton: No acute osseous abnormality. No worrisome lytic or blastic osseous lesions. Advanced degenerative spondylolysis noted within cervical spine. Other neck: Visualized lungs are clear. Visualized mediastinum grossly normal. Thyroid within normal limits. No adenopathy within the neck. No acute soft tissue abnormality. CTA HEAD Anterior circulation: Petrous segments widely patent bilaterally. Scattered calcified plaque within the cavernous ICAs with mild diffuse narrowing. A1 segments patent. Anterior communicating artery normal. Anterior cerebral arteries well opacified to their distal aspects. M1 segments widely patent without stenosis or occlusion. MCA bifurcations normal. Focal short-segment severe stenosis proximal right M2 branch, middle division (series 15, image 63). Distal MCA branches well opacified and symmetric. Posterior circulation: Focal plaque at the right V4 segment as it crosses the dural margin with secondary minimal narrowing. Right vertebral artery otherwise widely patent to the vertebrobasilar junction. Minimal plaque within the left V4 segment without stenosis. Posterior inferior cerebral arteries patent. Basilar artery fenestrated proximally. Basilar artery widely patent to its distal aspect. Superior cerebral arteries patent. Both of the posterior cerebral arteries arise from the basilar artery. Atheromatous irregularity with moderate smooth narrowing of the left P 2 segment. More mild atheromatous irregularity within the right PCA. Venous sinuses: Patent without evidence for venous sinus thrombosis. Anatomic variants: No significant anatomic variant. No aneurysm or vascular malformation. Small focal outpouching arising from the expected location of the takeoff of the left posterior communicating artery favored to reflect a small vascular infundibulum is associated with a hypoplastic left P com. Delayed phase: No pathologic enhancement. IMPRESSION: CTA NECK IMPRESSION: 1. Negative CTA  of the neck for patient age. No high-grade or critical stenosis. 2. Mild atheromatous plaque about the left carotid bifurcation without significant stenosis. 3. No significant atheromatous disease within the right carotid artery system. 4. Mild atheromatous plaque within the pre foraminal left V1 segment with resultant mild stenosis. Otherwise widely patent vertebral arteries within the neck. CTA HEAD IMPRESSION: 1. Negative for large vessel occlusion. No high-grade or correctable stenosis. 2. Short-segment severe nonocclusive stenosis proximal right M2 branch as above. 3. Calcified atheromatous plaque within the cavernous ICAs with mild diffuse narrowing. 4. Mild to moderate multifocal atheromatous irregularity within the PCAs bilaterally, left greater than right. Electronically Signed   By: Rise MuBenjamin  McClintock M.D.   On: 02/02/2016 02:08   Dg Chest 2 View  Result Date: 02/01/2016 CLINICAL DATA:  Stroke-like symptoms. EXAM: CHEST  2 VIEW COMPARISON:  None. FINDINGS: Cardiomediastinal silhouette is normal. Mediastinal contours appear intact. Torturous atherosclerotic aorta. There is no evidence of focal airspace consolidation, pleural effusion or pneumothorax. Osseous structures are without acute abnormality. Soft tissues are grossly normal. Breast implants are noted. IMPRESSION: No active cardiopulmonary disease. Electronically Signed   By: Ted Mcalpineobrinka  Dimitrova M.D.   On: 02/01/2016 18:15   Ct Angio Neck W Or Wo Contrast  Result Date: 02/02/2016 CLINICAL DATA:  Initial evaluation for acute stroke. EXAM: CT ANGIOGRAPHY HEAD AND NECK TECHNIQUE: Multidetector CT imaging of the head and neck was performed using the standard protocol during bolus administration of intravenous contrast. Multiplanar CT image reconstructions and MIPs were obtained to evaluate the vascular anatomy. Carotid stenosis measurements (when applicable) are obtained utilizing NASCET criteria, using the distal internal carotid  diameter as  the denominator. CONTRAST:  100 cc of Isovue 370. COMPARISON:  Prior MRI from earlier the same day. FINDINGS: CT HEAD Scalp soft tissues within normal limits. Globes and orbits unremarkable. Patient status post lens extraction bilaterally. Senescent calcifications noted. Visualized paranasal sinuses are clear. No mastoid effusion. Middle ear cavities are clear. Calvarium intact. Age-related cerebral atrophy with moderate chronic microvascular ischemic disease. Known acute lacunar infarct within the left internal capsule not well seen. No acute intracranial hemorrhage. No other acute large vessel territory infarct. No mass lesion, midline shift, or mass effect. No hydrocephalus. No extra-axial fluid collection. CTA NECK Aortic arch: Visualized aortic arch of normal caliber with normal branch pattern. No high-grade stenosis at the origin of the great vessels. Mild scattered plaque within the arch itself. Visualized subclavian arteries widely patent. Right carotid system: Right common carotid artery tortuous proximally. Right common carotid artery patent to the bifurcation without stenosis. No significant atheromatous plaque about the right bifurcation. Right ICA widely patent distally to the skullbase without stenosis, dissection, or occlusion. Right external carotid artery is branches within normal limits. Left carotid system: Left common carotid artery widely patent to the bifurcation. Minimal centric plaque about the left bifurcation without stenosis. Left ICA widely patent from the bifurcation to the skullbase without stenosis, dissection, or occlusion. Vertebral arteries:Both vertebral arteries arise from the subclavian arteries. Pre foraminal left V1 segment tortuous with multifocal atheromatous irregularity with resultant mild multi focal narrowing. Vertebral arteries otherwise widely patent within the neck without stenosis, dissection, or occlusion. Skeleton: No acute osseous abnormality. No worrisome lytic or  blastic osseous lesions. Advanced degenerative spondylolysis noted within cervical spine. Other neck: Visualized lungs are clear. Visualized mediastinum grossly normal. Thyroid within normal limits. No adenopathy within the neck. No acute soft tissue abnormality. CTA HEAD Anterior circulation: Petrous segments widely patent bilaterally. Scattered calcified plaque within the cavernous ICAs with mild diffuse narrowing. A1 segments patent. Anterior communicating artery normal. Anterior cerebral arteries well opacified to their distal aspects. M1 segments widely patent without stenosis or occlusion. MCA bifurcations normal. Focal short-segment severe stenosis proximal right M2 branch, middle division (series 15, image 63). Distal MCA branches well opacified and symmetric. Posterior circulation: Focal plaque at the right V4 segment as it crosses the dural margin with secondary minimal narrowing. Right vertebral artery otherwise widely patent to the vertebrobasilar junction. Minimal plaque within the left V4 segment without stenosis. Posterior inferior cerebral arteries patent. Basilar artery fenestrated proximally. Basilar artery widely patent to its distal aspect. Superior cerebral arteries patent. Both of the posterior cerebral arteries arise from the basilar artery. Atheromatous irregularity with moderate smooth narrowing of the left P 2 segment. More mild atheromatous irregularity within the right PCA. Venous sinuses: Patent without evidence for venous sinus thrombosis. Anatomic variants: No significant anatomic variant. No aneurysm or vascular malformation. Small focal outpouching arising from the expected location of the takeoff of the left posterior communicating artery favored to reflect a small vascular infundibulum is associated with a hypoplastic left P com. Delayed phase: No pathologic enhancement. IMPRESSION: CTA NECK IMPRESSION: 1. Negative CTA of the neck for patient age. No high-grade or critical  stenosis. 2. Mild atheromatous plaque about the left carotid bifurcation without significant stenosis. 3. No significant atheromatous disease within the right carotid artery system. 4. Mild atheromatous plaque within the pre foraminal left V1 segment with resultant mild stenosis. Otherwise widely patent vertebral arteries within the neck. CTA HEAD IMPRESSION: 1. Negative for large vessel occlusion. No high-grade or correctable  stenosis. 2. Short-segment severe nonocclusive stenosis proximal right M2 branch as above. 3. Calcified atheromatous plaque within the cavernous ICAs with mild diffuse narrowing. 4. Mild to moderate multifocal atheromatous irregularity within the PCAs bilaterally, left greater than right. Electronically Signed   By: Rise Mu M.D.   On: 02/02/2016 02:08   Mr Brain Wo Contrast  Result Date: 02/01/2016 CLINICAL DATA:  Right-sided facial droop. Stroke like symptoms. Slurred speech. Symptoms began at 7:30 a.m. EXAM: MRI HEAD WITHOUT CONTRAST TECHNIQUE: Multiplanar, multiecho pulse sequences of the brain and surrounding structures were obtained without intravenous contrast. COMPARISON:  CT head without contrast 03/07/2013 FINDINGS: Brain: Acute nonhemorrhagic linear infarct is present within the posterior limb of the left internal capsule measuring up to 14 mm. No other acute infarct is present. Extensive dilated perivascular spaces are present throughout the basal ganglia and thalami. Advanced atrophy and diffuse white matter disease is present bilaterally. The ventricles are proportionate to the degree of atrophy. White matter changes extend into the brainstem. The brainstem and cerebellum are otherwise unremarkable. Internal auditory canals are within normal limits. Vascular: Flow is present in the major intracranial arteries. Skull and upper cervical spine: The skullbase is within normal limits. Midline sagittal images demonstrate an infarct involving the anterior corpus  callosum. Midline intracranial structures are otherwise within normal limits. Mild degenerative changes are present in the upper cervical spine, and consistent with a age. Sinuses/Orbits: The paranasal sinuses and mastoid air cells are clear. Bilateral lens replacements are present. The globes and orbits are intact. Other: IMPRESSION: 1. Acute nonhemorrhagic 1.4 cm linear infarct within the posterior limb of the left internal capsule. This corresponds with the patient's right-sided symptoms and a aphasia. 2. Advanced atrophy and diffuse white matter disease likely reflects the sequela of chronic microvascular ischemia without other acute infarct. 3. Dilated perivascular spaces throughout the basal ganglia. These results were called by telephone at the time of interpretation on 02/01/2016 at 3:58 pm to Dr. Rubin Payor, who verbally acknowledged these results. Electronically Signed   By: Marin Roberts M.D.   On: 02/01/2016 16:01     Assessment/Plan: Diagnosis: Linear infarct within the posterior limb of the left internal capsule Labs and images independently reviewed.  Records reviewed and summated above. Stroke: Continue secondary stroke prophylaxis and Risk Factor Modification listed below:   Antiplatelet therapy:   Blood Pressure Management:  Continue current medication with prn's with permisive HTN per primary team Statin Agent:   Prediabetes management:   Right sided hemiparesis: fit for orthosis to prevent contractures (resting hand splint for day, wrist cock up splint at night, PRAFO, etc) Motor recovery: Fluoxetine   1. Does the need for close, 24 hr/day medical supervision in concert with the patient's rehab needs make it unreasonable for this patient to be served in a less intensive setting? Yes 2. Co-Morbidities requiring supervision/potential complications: essential tremor (consider medications), Dysphagia  (cont SLP, advance diet as tolerated), leukocytosis (cont to monitor for  signs and symptoms of infection, further workup if indicated) 3. Due to bladder management, safety, disease management, medication administration and patient education, does the patient require 24 hr/day rehab nursing? Yes 4. Does the patient require coordinated care of a physician, rehab nurse, PT (1-2 hrs/day, 5 days/week), OT (1-2 hrs/day, 5 days/week) and SLP (1-2 hrs/day, 5 days/week) to address physical and functional deficits in the context of the above medical diagnosis(es)? Yes Addressing deficits in the following areas: balance, endurance, locomotion, strength, transferring, bowel/bladder control, dressing, toileting, speech, swallowing and psychosocial  support 5. Can the patient actively participate in an intensive therapy program of at least 3 hrs of therapy per day at least 5 days per week? Yes 6. The potential for patient to make measurable gains while on inpatient rehab is excellent 7. Anticipated functional outcomes upon discharge from inpatient rehab are min assist and mod assist  with PT, min assist and mod assist with OT, independent and modified independent with SLP. 8. Estimated rehab length of stay to reach the above functional goals is: 17-20 days. 9. Does the patient have adequate social supports and living environment to accommodate these discharge functional goals? Potentially 10. Anticipated D/C setting: Home 11. Anticipated post D/C treatments: HH therapy and Home excercise program 12. Overall Rehab/Functional Prognosis: good  RECOMMENDATIONS: This patient's condition is appropriate for continued rehabilitative care in the following setting: Likely CIR after completion of medical workup.  Patient has agreed to participate in recommended program. Yes Note that insurance prior authorization may be required for reimbursement for recommended care.  Comment: Rehab Admissions Coordinator to follow up.  Maryla Morrow, MD, [facility] 02/03/2016    Revision History                         Routing History

## 2016-02-04 NOTE — Progress Notes (Signed)
Orthopedic Tech Progress Note Patient Details:  Kelly HumphreysBennie H Lecompte [birth year] [id number] Brace order completed by hanger vendor. Patient ID: Kelly HumphreysBennie H Durnin, female   DOB: [birth year], 82 y.o.   MRN: [id number]   Jennye MoccasinHughes, Romon Devereux Craig 02/04/2016, 5:13 PM

## 2016-02-04 NOTE — Evaluation (Signed)
Speech Language Pathology Assessment and Plan  Patient Details  Name: Kelly [name] MRN: [id number] Date of Birth: November 03, 1927  SLP Diagnosis: Dysarthria;Dysphagia  Rehab Potential: Good ELOS: 10 to 20 days    Today's Date: 02/04/2016 SLP Individual Time: 0930-1030 SLP Individual Time Calculation (min): 60 min    Problem List: Patient Active Problem List   Diagnosis Date Noted  . Cerebrovascular accident (CVA) ([place]) 02/03/2016  . Dysarthria, post-stroke   . Dysphagia, post-stroke   . Leukocytosis   . Prediabetes   . Right hemiparesis ([place])   . Cerebral infarction due to unspecified mechanism   . Other secondary hypertension   . Overactive bladder   . Diastolic dysfunction   . Hyperlipidemia   . Essential hypertension   . Essential tremor   . Stroke (cerebrum) ([place]) 02/01/2016  . Facial droop due to stroke 02/01/2016  . Essential and other specified forms of tremor 02/20/2013  . Dysphonia 02/20/2013  . Trigeminal neuralgia 02/20/2013   Past Medical History:  Past Medical History:  Diagnosis Date  . Dysphonia 02/20/2013  . Essential and other specified forms of tremor 02/20/2013  . Essential tremor   . Osteoporosis   . Psoriasis    Past Surgical History:  Past Surgical History:  Procedure Laterality Date  . APPENDECTOMY  1940  . bladder tack    . CATARACT EXTRACTION Bilateral   . HEMORRHOID SURGERY  1960  . HUMERUS FRACTURE SURGERY    . LAPAROSCOPIC HYSTERECTOMY  2006  . torn rotator cuff    . WRIST FRACTURE SURGERY  2005   seconday shoulder 2001    Assessment / Plan / Recommendation Clinical Impression Kelly [name] is a 82 y.o. right handed female with history of essential tremor. Per chart review patient lives with spouse. 2 level home with bedroom on first floor. 2 steps to entry. Used a walker prior to admission. Presented 02/01/2016 with right side weakness as well as slurred speech. MRI of the brain showed acute nonhemorrhagic 1.4 cm linear infarct  within the posterior limb of the left internal capsule. CTA of head and neck negative. Patient did not receive TPA. Neurology consulted presently on aspirin for CVA prophylaxis. Echocardiogram With no wall motion abnormalities. Grade 1 diastolic dysfunction. Subcutaneous heparin for DVT prophylaxis. Dysphagia #3 nectar thick liquid diet. Physical and occupational therapy evaluations completed with recommendations of physical medicine rehabilitation consult. Patient was admitted for a comprehensive rehabilitation program on 02/03/2016.   Bedside Swallow evaluation and Speech-language evaluations were completed on 9/80/12017. Pt continues to present with moderate pharyngeal swallow deficits as evidenced by dis-coordinated swallow with thin liquids. Pt with cough immediately present with rim sips and teaspoon sips of thin liquids. Pt with increased lingual manipulation of regular boluses and increased bolus awareness as evidenced by lingual sweeps to effectively clear oral cavity. Pt appeared to exhibit increased fatigue with continued trials of thins and regular, therefore it is recommended that pt remain on dysphagia 3 diet with nectar thick liquids and skilled ST to initiate pharyngeal strengthening exercises. Pt presents with moderate flaccid dysarthria c/b imprecise articulation and decreased volume. Pt ~75 intelligible at the sentence level and ~50% intelligible at the simple conversation level. These deficits impact the patient's overall safety with PO intake and her ability to communicate effectively. Patient would benefit from skilled SLP intervention in order to maximize her functional independence prior to discharge.    Skilled Therapeutic Interventions          Bedside Swallow Evaluation and Speech-Language  Evaluations completed with results and recommendations reviewed with pt. Skilled dysphagia therapy initiated with Mod A verbal cues to recall and implement swallow strategies while consuming trials of  regular diet textures and thin liquids.SLP further facilitated session by providing speech intelligibility strategies which pt was able to demonstrate at the sentence level with Min A verbal cues for simple sentences.    SLP Assessment  Patient will need skilled Speech Lanaguage Pathology Services during CIR admission    Recommendations  SLP Diet Recommendations: Dysphagia 3 (Mech soft);Nectar Liquid Administration via: Cup Medication Administration: Whole meds with puree Supervision: Patient able to self feed;Intermittent supervision to cue for compensatory strategies Compensations: Slow rate;Small sips/bites;Lingual sweep for clearance of pocketing Postural Changes and/or Swallow Maneuvers: Seated upright 90 degrees Oral Care Recommendations: Oral care BID Patient destination: Home Follow up Recommendations: None Equipment Recommended: To be determined    SLP Frequency 3 to 5 out of 7 days   SLP Duration  SLP Intensity  SLP Treatment/Interventions 10 to 20 days  Minumum of 1-2 x/day, 30 to 90 minutes  Cueing hierarchy;Patient/family education;Oral motor exercises;Dysphagia/aspiration precaution training;Therapeutic Exercise    Pain    Prior Functioning Cognitive/Linguistic Baseline: Within functional limits Type of Home: House  Lives With: Spouse Available Help at Discharge: Family;Available 24 hours/day Vocation: Retired  Function:  Eating Eating   Modified Consistency Diet: No (Trials of regular and thin liquids with ST only) Eating Assist Level: Supervision or verbal cues;Helper checks for pocketed food           Cognition Comprehension Comprehension assist level: Understands complex 90% of the time/cues 10% of the time  Expression   Expression assist level: Expresses complex 90% of the time/cues < 10% of the time  Social Interaction Social Interaction assist level: Interacts appropriately with others with medication or extra time (anti-anxiety,  antidepressant).  Problem Solving Problem solving assist level: Solves complex problems: Recognizes & self-corrects  Memory Memory assist level: Recognizes or recalls 90% of the time/requires cueing < 10% of the time   Short Term Goals: Week 1: SLP Short Term Goal 1 (Week 1): Pt will perform pharyngeal strengthening exercises with Min A verbal cues to strengthen swallow to demonstrate readiness for diet upgrade.  SLP Short Term Goal 2 (Week 1): Pt will consume trials of thin liquids without overt s/s of aspiration with Min A verbal cues for use of swallowing compensatory strategies.  SLP Short Term Goal 3 (Week 1): Pt will demonstrate efficient mastication and oral clearance of regular textures over 3 observed sessions to demonstrate readiness for diet advancment.  SLP Short Term Goal 4 (Week 1): Pt will utilize speech intelligibility strategies at the sentence level with Min A verbal cues to achieve 100% intelligibility.   Refer to Care Plan for Long Term Goals  Recommendations for other services: None  Discharge Criteria: Patient will be discharged from SLP if patient refuses treatment 3 consecutive times without medical reason, if treatment goals not met, if there is a change in medical status, if patient makes no progress towards goals or if patient is discharged from hospital.  The above assessment, treatment plan, treatment alternatives and goals were discussed and mutually agreed upon: by patient  Kelly [name] 02/04/2016, 12:43 PM

## 2016-02-04 NOTE — Evaluation (Signed)
Physical Therapy Assessment and Plan  Patient Details  Name: Kelly [name] MRN: [id number] Date of Birth: 08-17-1927  PT Diagnosis: Abnormal posture, Abnormality of gait, Coordination disorder, Difficulty walking, Hemiplegia dominant and Muscle weakness Rehab Potential: Good ELOS: 3-4 weeks   Today's Date: 02/04/2016 PT Individual Time: 0623-7628 PT Individual Time Calculation (min): 85 min     Problem List:  Patient Active Problem List   Diagnosis Date Noted  . Cerebrovascular accident (CVA) ([place]) 02/03/2016  . Dysarthria, post-stroke   . Dysphagia, post-stroke   . Leukocytosis   . Prediabetes   . Right hemiparesis ([place])   . Cerebral infarction due to unspecified mechanism   . Other secondary hypertension   . Overactive bladder   . Diastolic dysfunction   . Hyperlipidemia   . Essential hypertension   . Essential tremor   . Stroke (cerebrum) ([place]) 02/01/2016  . Facial droop due to stroke 02/01/2016  . Essential and other specified forms of tremor 02/20/2013  . Dysphonia 02/20/2013  . Trigeminal neuralgia 02/20/2013    Past Medical History:  Past Medical History:  Diagnosis Date  . Dysphonia 02/20/2013  . Essential and other specified forms of tremor 02/20/2013  . Essential tremor   . Osteoporosis   . Psoriasis    Past Surgical History:  Past Surgical History:  Procedure Laterality Date  . APPENDECTOMY  1940  . bladder tack    . CATARACT EXTRACTION Bilateral   . HEMORRHOID SURGERY  1960  . HUMERUS FRACTURE SURGERY    . LAPAROSCOPIC HYSTERECTOMY  2006  . torn rotator cuff    . WRIST FRACTURE SURGERY  2005   seconday shoulder 2001    Assessment & Plan Clinical Impression: Patient is a 82 y.o. year old female, right handed, with history of essential tremor. Per chart review patient lives with spouse. 2 level home with bedroom on first floor. 2 steps to entry. Used a walker prior to admission. Presented 02/01/2016 with right side weakness as well as slurred  speech. MRI of the brain showed acute nonhemorrhagic 1.4 cm linear infarct within the posterior limb of the left internal capsule. CTA of head and neck negative. Patient did not receive TPA. Neurology consulted presently on aspirin for CVA prophylaxis. Echocardiogram With no wall motion abnormalities. Grade 1 diastolic dysfunction. Subcutaneous heparin for DVT prophylaxis. Dysphagia #3 nectar thick liquid diet. Physical and occupational therapy evaluationscompleted with recommendations of physical medicine rehabilitation consult.Patient was admitted for a comprehensive rehabilitation program.  Patient transferred to CIR on 02/03/2016 .   Patient currently requires max with mobility secondary to muscle weakness, decreased cardiorespiratoy endurance, decreased coordination, decreased attention to right, and decreased sitting balance, decreased standing balance, hemiplegia and decreased balance strategies.  Prior to hospitalization, patient was independent  with mobility and lived with Spouse (Husband - [name]) in a House home.  Home access is 2Stairs to enter.  Patient will benefit from skilled PT intervention to maximize safe functional mobility, minimize fall risk and decrease caregiver burden for planned discharge home with 24 hour assist.  Anticipate patient will benefit from follow up [facility] at discharge.  PT - End of Session Activity Tolerance: Tolerates 30+ min activity with multiple rests Endurance Deficit: Yes Endurance Deficit Description:  (2/2 muscle weakness, poor cardiovascular endurance) PT Assessment Rehab Potential (ACUTE/IP ONLY): Good PT Patient demonstrates impairments in the following area(s): Balance;Endurance;Motor;Safety;Sensory PT Transfers Functional Problem(s): Bed Mobility;Bed to Chair;Car;Furniture PT Locomotion Functional Problem(s): Ambulation;Wheelchair Mobility;Stairs PT Plan PT Intensity: Minimum of 1-2 x/day ,45 to  90 minutes PT Frequency: 5 out of 7 days PT Duration  Estimated Length of Stay: 3-4 weeks PT Treatment/Interventions: Disease management/prevention;Functional mobility training;Patient/family education;Splinting/orthotics;Therapeutic Exercise;Visual/perceptual remediation/compensation;UE/LE Coordination activities;Therapeutic Activities;Pain management;Functional electrical stimulation;Discharge planning;Balance/vestibular training;Ambulation/gait training;Community reintegration;DME/adaptive equipment instruction;Neuromuscular re-education;Psychosocial support;Stair training;UE/LE Strength taining/ROM;Wheelchair propulsion/positioning PT Transfers Anticipated Outcome(s): supervision PT Locomotion Anticipated Outcome(s): min assist with LRAD PT Recommendation Recommendations for Other Services: Speech consult Follow Up Recommendations: Home health PT;24 hour supervision/assistance Patient destination: Home Equipment Recommended: To be determined  Skilled Therapeutic Intervention Pt received in w/c & agreeable to PT, denying c/o pain. Educated pt on CIR schedule, interdisciplinary team conferences, and safety plan. Initiated PT evaluation and manual testing completed; please see below. Pt is currently at a mod assist level for transfers, max assist for ambulation x 20 ft with L rail in hallway, and max assist to ascend 3 steps (3") with L rail. In standing and during ambulation, pt pushing to R, decreased weight shifting to R but able to activate quads and hamstrings to advance RLE with minimal assistance. Pt required extended rest break between gait and stair negotiation 2/2 significant fatigue with task. During stair negotiation pt able to ascend 3 steps before noting significant fatigue & requiring dependent assist to descend stairs and return to w/c. Pt reports "if only my right arm could work I could do better". Provided encouragement and education regarding recovery. At end of session pt left in w/c in room with all needs within reach.   PT  Evaluation Precautions/Restrictions Precautions Precautions: Fall Precaution Comments: R hemiplegia, R hip bursitis Restrictions Weight Bearing Restrictions: No  General Chart Reviewed: Yes Additional Pertinent History:  (hx of recent falls, bursitis in R hip) Response to Previous Treatment: Patient reporting fatigue but able to participate. Family/Caregiver Present: No   Vital Signs Therapy Vitals Pulse Rate: 90 (after ambulation) Oxygen Therapy SpO2: 98 % O2 Device: Not Delivered Pulse Oximetry Type: Intermittent  Pain Pain Assessment Pain Assessment: No/denies pain  Home Living/Prior Functioning Home Living Living Arrangements: Spouse/significant other Available Help at Discharge: Family;Available 24 hours/day Type of Home: House Home Access: Stairs to enter [employer] of Steps: 2 Entrance Stairs-Rails: None (reports she has a post she normally holds to) Home Layout: Two level;Able to live on main level with bedroom/bathroom Bathroom Shower/Tub: Chiropodist: Standard Bathroom Accessibility: Yes  Lives With: Spouse (Husband - [name]) Prior Function Level of Independence: Independent with gait;Independent with basic ADLs;Independent with transfers;Independent with homemaking with ambulation (used rollator for past 2 weeks 2/2 R bursitis pain but pt did not use any AD prior to fall; pt reports her husband cooks, she does light cleaning but has house cleaner come in every other week)  Able to Take Stairs?: Yes Driving: Yes Vocation: Retired (retired Cabin crew) Leisure: Hobbies-yes (Comment) Comments:  (plays duplex bridge with husband 2x/week)  Vision/Perception  Pt reports she wears glasses at all times at baseline.  Pt denies changes in vision following CVA. Pt with R inattention.  Cognition Overall Cognitive Status: Within Functional Limits for tasks assessed Arousal/Alertness: Awake/alert Orientation Level: Oriented X4 Attention:  Focused Sustained Attention: Appears intact Memory: Appears intact Awareness: Appears intact Problem Solving: Appears intact Safety/Judgment: Appears intact  Sensation Sensation Light Touch: Appears Intact (BLE) Proprioception: Appears Intact (BLE) Proprioception Impaired Details: Impaired RUE Coordination Gross Motor Movements are Fluid and Coordinated: No Fine Motor Movements are Fluid and Coordinated: No Coordination and Movement Description: R hemiplegia Finger Nose Finger Test: Intact with L UE with tremors which per present prior  to stroke Heel Shin Test:  (limited on RLE by muscle weakness and decreased neuromuscular control)  Motor  Motor Motor: Hemiplegia (RUE/LE) Motor - Skilled Clinical Observations: weakness BLE   Mobility Bed Mobility Bed Mobility: Rolling Right;Rolling Left;Supine to Sit;Sit to Supine Rolling Right: 4: Min assist Rolling Left: 2: Max assist Rolling Left Details: Tactile cues for weight shifting;Tactile cues for sequencing;Verbal cues for sequencing;Verbal cues for technique Supine to Sit: 4: Min assist (RLE) Sit to Supine: 4: Min assist Transfers Transfers: Yes Sit to Stand: 2: Max assist Sit to Stand Details: Verbal cues for sequencing;Verbal cues for technique Stand to Sit: 2: Max assist Stand Pivot Transfers: 3: Mod assist Stand Pivot Transfer Details: Verbal cues for sequencing;Verbal cues for technique;Manual facilitation for weight shifting;Tactile cues for weight shifting;Tactile cues for initiation   Locomotion  Ambulation Ambulation: Yes Ambulation/Gait Assistance: 2: Max assist Ambulation Distance (Feet): 20 Feet Assistive device: Other (Comment) (L rail in hallway) Ambulation/Gait Assistance Details: Verbal cues for technique;Verbal cues for sequencing;Manual facilitation for weight shifting;Manual facilitation for placement;Verbal cues for gait pattern;Verbal cues for precautions/safety Gait Gait: Yes Gait Pattern: Decreased  weight shift to left;Decreased weight shift to right;Decreased step length - right;Decreased step length - left;Decreased stance time - right;Poor foot clearance - right (pushing R more with fatigue) Stairs / Additional Locomotion Stairs: Yes Stairs Assistance: 2: Max assist (max assist to ascend stairs, dependent to descend stairs) Stairs Assistance Details: Verbal cues for technique;Verbal cues for sequencing;Manual facilitation for placement;Manual facilitation for weight shifting Stairs Assistance Details (indicate cue type and reason):  (pt noted extreme fatigue after ascending 3 steps (3") & required dependent assist to descend stairs) Stair Management Technique: One rail Left Number of Stairs: 3 Height of Stairs: 3 Wheelchair Mobility Wheelchair Mobility: Yes Wheelchair Assistance: 3: Mod assist (w/c too tall to attempt L hemi technique and required mod assist for steering) Wheelchair Propulsion: Left upper extremity Wheelchair Parts Management: Needs assistance Distance: 115 ft   Trunk/Postural Assessment  Cervical Assessment Cervical Assessment: Exceptions to [facility] (Flexed) Thoracic Assessment Thoracic Assessment: Exceptions to [facility] (Kyphotic) Lumbar Assessment Lumbar Assessment: Exceptions to [facility] (Posterior pelvic tilt) Postural Control Postural Control: Deficits on evaluation (Verbal and tactile cues for upright posture)   Balance Balance Balance Assessed: Yes Dynamic Sitting Balance Dynamic Sitting - Balance Support: Right upper extremity supported;Feet unsupported Dynamic Sitting - Level of Assistance: 5: Stand by assistance Sitting balance - Comments: Sitting EOB, R lean which can self correct with continual VCs Static Standing Balance Static Standing - Balance Support: Left upper extremity supported Static Standing - Level of Assistance: 2: Max assist  Extremity Assessment  RLE Assessment RLE Assessment: Exceptions to [facility] RLE PROM (degrees) Overall PROM Right  Lower Extremity: Within functional limits for tasks assessed RLE Strength Right Hip Flexion: 2/5 Right Knee Flexion: 3+/5 (hamstring activation) Right Knee Extension: 3/5 (able to achieve full extension with effort) Right Ankle Dorsiflexion: 2-/5 LLE Assessment LLE Assessment: Exceptions to WFL LLE AROM (degrees) Overall AROM Left Lower Extremity: Within functional limits for tasks assessed LLE Strength Left Hip Flexion: 4-/5 Left Knee Flexion: 4-/5 Left Knee Extension: 3+/5 Left Ankle Dorsiflexion: 3+/5   See Function Navigator for Current Functional Status.   Refer to Care Plan for Long Term Goals  Recommendations for other services: Other: Speech Therapy  Discharge Criteria: Patient will be discharged from PT if patient refuses treatment 3 consecutive times without medical reason, if treatment goals not met, if there is a change in medical status, if patient  makes no progress towards goals or if patient is discharged from hospital.  The above assessment, treatment plan, treatment alternatives and goals were discussed and mutually agreed upon: by patient  Waunita Schooner 02/04/2016, 5:38 PM

## 2016-02-04 NOTE — Plan of Care (Signed)
Problem: RH Balance Goal: LTG Patient will maintain dynamic standing balance (PT) LTG:  Patient will maintain dynamic standing balance with assistance during mobility activities (PT) With LRAD   

## 2016-02-04 NOTE — Progress Notes (Signed)
Orthopedic Tech Progress Note Patient Details:  Dan HumphreysBennie H Jester February 11, 1928 [id number]  Patient ID: Dan HumphreysBennie H Novoa, female   DOB: February 11, 1928, 82 y.o.   MRN: [id number]   Nikki DomCrawford, Grainne Knights 02/04/2016, 12:53 PM Called in advanced brace order; spoke with Juanda ChanceKanisha

## 2016-02-04 NOTE — Progress Notes (Signed)
Patient information reviewed and entered into eRehab System by Becky Rubye Strohmeyer, covering PPS coordinator. Information including medical coding and functional independence measure will be reviewed and updated through discharge.  Per nursing, patient was given "Data Collection Information Summary for Patients in Inpatient Rehabilitation Facilities with attached Privacy Act Statement Health Care Records" upon admission.     

## 2016-02-04 NOTE — Progress Notes (Signed)
[facility] PHYSICAL MEDICINE & REHABILITATION     PROGRESS NOTE  Subjective/Complaints:  Pt laying in bed this AM.  She slept well overnight and is ready for therapies to begin.  She wants to eat breakfast though, before she gets started.   ROS: Denies CP, SOB, N/V/D  Objective: Vital Signs: Blood pressure 119/72, pulse 65, temperature 98 F (36.7 C), temperature source Oral, resp. rate 16, weight 48.2 kg (106 lb 4.2 oz), SpO2 97 %. No results found.  Recent Labs  02/01/16 1418 02/01/16 1443 02/04/16 0713  WBC 11.5*  --  8.4  HGB 12.4 12.9 13.0  HCT 39.0 38.0 41.0  PLT 285  --  295    Recent Labs  02/01/16 1418 02/01/16 1443 02/04/16 0713  NA 138 141 141  K 4.1 4.2 4.0  CL 109 106 110  GLUCOSE 112* 111* 96  BUN 22* 24* 17  CREATININE 0.70 0.70 0.73  CALCIUM 9.0  --  9.0   CBG (last 3)  No results for input(s): GLUCAP in the last 72 hours.  Wt Readings from Last 3 Encounters:  02/04/16 48.2 kg (106 lb 4.2 oz)  02/01/16 52.2 kg (115 lb)  02/20/13 56.7 kg (125 lb)    Physical Exam:  BP 119/72 (BP Location: Right Arm)   Pulse 65   Temp 98 F (36.7 C) (Oral)   Resp 16   Wt 48.2 kg (106 lb 4.2 oz)   SpO2 97%   BMI 20.75 kg/m  Constitutional: She appears well-developed. Frail HENT: Normocephalicand atraumatic.  Eyes: EOMand Conj are normal.  Cardiovascular: Normal rateand regular rhythm.  Respiratory: Effort normaland breath sounds normal. No respiratory distress.  GI: Soft. Bowel sounds are normal. She exhibits no distension.  Musculoskeletal: She exhibits no edemaor tenderness.  Neurological: She is alert and oriented.  Moderate dysarthria.  Follows simple commands.  Fair awareness of deficits Right facial droop +Resting tremor DTRs symmetric Motor: RUE: shoulder abduction, elbow flexion/extension 1+/5, wrist, hand 0/5 RLE: 4/5 hip flexion, knee extension, 4+/5 ankle dorsi/plantar flexion LUE/LLE: 5/5 proximal to distal Psychiatric: She has  a normal mood and affect. Her behavior is normal Skin: Skin is warmand dry   Assessment/Plan: 1. 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 Physiatrist is providing close team supervision and 24 hour management of active medical problems listed below. Physiatrist and rehab team continue to assess barriers to discharge/monitor patient progress toward functional and medical goals.  Function:  Bathing Bathing position      Bathing parts      Bathing assist        Upper Body Dressing/Undressing Upper body dressing                    Upper body assist        Lower Body Dressing/Undressing Lower body dressing                                  Lower body assist        Toileting Toileting          Toileting assist     Transfers Chair/bed Optician, dispensingtransfer             Locomotion Ambulation           Wheelchair  Cognition Comprehension    Expression    Social Interaction    Problem Solving    Memory       Medical Problem List and Plan: 1.  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  DVT Prophylaxis/Anticoagulation: Subcutaneous heparin. Monitor platelet counts and any signs of bleeding 3. Pain Management: Tylenol as needed 4. Dysphagia. Dysphagia #3 nectar liquids. Monitor hydration. Follow-up speech therapy 5. Neuropsych: This patient is capable of making decisions on her own behalf. 6. Skin/Wound Care: Routine skin checks 7. Fluids/Electrolytes/Nutrition: Routine I&O   BMP WNL on 9/12 8. Permissive hypertension: WNL at present. 9. Essential tremor. Patient maintained on Topamax per family 10. Hyperlipidemia. Lipitor 11.  Overactive bladder. Ditropan 5 mg daily at bedtime. Check PVR 3 12. Leukocytosis:   WBCs 8.4 on 9/12 13. Prediabetes:    Glucose WNL at present 14. Diastolic dysfunction: Monitor for fluid overload  LOS (Days) 1 A FACE TO FACE EVALUATION WAS PERFORMED  Emanie Behan Karis Juba 02/04/2016 8:17 AM

## 2016-02-04 NOTE — Progress Notes (Signed)
[facility] Admission Coordinator Signed Physical Medicine and Rehabilitation  PMR Pre-admission Date of Service: 02/03/2016 5:24 PM  Related encounter: ED to Hosp-Admission (Discharged) from 02/01/2016 in [facility] 52[facility] NEURO MEDICAL       [] Hide copied text PMR Admission Coordinator Pre-Admission Assessment  Patient: Kelly [name] is an 82 y.o., female MRN: [id number] DOB: 01/01/1928 Height: 5' (152.4 cm) Weight: 52.2 kg (115 lb)                                                                                                                                                                                                                                                                          Insurance Information HMO:     PPO:      PCP:      IPA:      80/20:      OTHER:  PRIMARY: Medicare A and B      Policy#: [id number]      Subscriber:  Se;f CM Name:       Phone#:      Fax#:  Pre-Cert#:       Employer: retired Benefits:  Phone #:      Name:  Eff. Date:  11/23/1994     Deduct:  $1316      Out of Pocket Max:  n/a      Life Max: n/a CIR:  100%      SNF:  100% first 20 days Outpatient:  80%     Co-Pay: 20% Home Health:  100%      Co-Pay:   DME:  80%     Co-Pay:  20% Providers:  Pt. choice [facility]      Policy#: [id number] a      Subscriber:  self CM Name:       Phone#:      Fax#:  Pre-Cert#:       Employer:  Benefits:  Phone #:      Name:  Eff. Date:      Deduct:       Out of Pocket Max:       Life Max:  CIR:       SNF:  Outpatient:  Co-Pay:  Home Health:       Co-Pay:  DME:      Co-Pay:   Medicaid Application Date:       Case Manager:  Disability Application Date:       Case Worker:   Emergency Contact Information        Contact Information    Name Relation Home Work [place] Daughter [phone]     [name],Kelly Spouse [phone]  [phone]     Current Medical History  Patient Admitting Diagnosis:  Linear infarct within the posterior limb of the left internal capsule History of Present Illness: Kelly H Rallsis a 82 y.o.right handed femalewith history of essential tremor. Per chart review patient lives with spouse. 2 level home with bedroom on first floor. 2 steps to entry. Used a walker prior to admission. Presented 02/01/2016 with right side weakness as well as slurred speech. MRI of the brain showed acute nonhemorrhagic 1.4 cm linear infarct within the posterior limb of the left internal capsule. CTA of head and neck negative. Patient did not receive TPA. Neurology consulted presently on aspirin for CVA prophylaxis. Echocardiogram With no wall motion abnormalities. Grade 1 diastolic dysfunction.. Subcutaneous heparin for DVT prophylaxis. Dysphagia #3 nectar thick liquid diet. Physical and occupational therapy evaluationscompleted with recommendations of physical medicine rehabilitation consult.Patient was admitted for a comprehensive rehabilitation program Total: 7 [facility]    Past Medical History      Past Medical History:  Diagnosis Date  . Dysphonia 02/20/2013  . Essential and other specified forms of tremor 02/20/2013  . Essential tremor   . Osteoporosis   . Psoriasis     Family History  family history includes Arthritis in her brother; Osteoporosis in her maternal grandmother.  Prior Rehab/Hospitalizations:  Has the patient had major surgery during 100 days prior to admission? No  Current Medications   Current Facility-Administered Medications:  .   stroke: mapping our early stages of recovery book, , Does not apply, Once, [name], PA-C .  acetaminophen (TYLENOL) tablet 650 mg, 650 mg, Oral, Q4H PRN **OR** acetaminophen (TYLENOL) suppository 650 mg, 650 mg, Rectal, Q4H PRN, [name], PA-C .  aspirin suppository 300 mg, 300 mg, Rectal, Daily **OR** aspirin tablet 325 mg, 325 mg, Oral, Daily, [name], PA-C, 325 mg at 02/03/16 1024 .  atorvastatin  (LIPITOR) tablet 40 mg, 40 mg, Oral, q1800, [name], MD, 40 mg at 02/02/16 2143 .  heparin injection 5,000 Units, 5,000 Units, Subcutaneous, Q8H, [name], PA-C, 5,000 Units at 02/03/16 1419 .  oxybutynin (DITROPAN) tablet 5 mg, 5 mg, Oral, QHS, [name], PA-C, 5 mg at 02/02/16 2131 .  pantoprazole (PROTONIX) EC tablet 40 mg, 40 mg, Oral, Daily, [name], PA-C, 40 mg at 02/03/16 1024 .  senna-docusate (Senokot-S) tablet 1 tablet, 1 tablet, Oral, QHS PRN, [name], PA-C .  topiramate (TOPAMAX) tablet 25 mg, 25 mg, Oral, BID, [name], PA-C, 25 mg at 02/03/16 1024  Patients Current Diet: DIET DYS 3 Room service appropriate? Yes; Fluid consistency: Nectar Thick  Precautions / Restrictions Precautions Precautions: Fall Precaution Comments: up w/assist, right side weakness; hx fall 2 months ago; right hip bursitis Restrictions Weight Bearing Restrictions: No   Has the patient had 2 or more falls or a fall with injury in the past year?Yes; pt. Larey Seat about a week ago in [place] while at a wedding, injuring right hip  Prior Activity Level Community (5-7x/wk): [place]  reports pt. and he walked 2 miles about 3 times per week up [place] last coulple of weeks.  Pt and he go out most days, and play duplicate bridge 2x/week  Home Assistive Devices / Equipment Home Assistive Devices/Equipment: Blood pressure cuff, Cane (specify quad or straight), Eyeglasses, Hearing aid, Hand-held shower hose, Walker (specify type) Home Equipment: Walker - 4 wheels  Prior Device Use: Indicate devices/aids used by the patient prior to current illness, exacerbation or injury? Walker; prior to pt's fall about a week ago, she did not use an assistive device.  She has used a rolling walker since the fall due to hip pain  Prior Functional Level Prior Function Level of Independence: Independent with assistive device(s) Comments: Pt had a fall a few weeks ago while she was out dancing  with her husband; since then she has been using a rollator for all mobility. Prior to fall pt was independent with mobility and ADL with use of AD. Has housekeeper that does cleaning every other week.  Self Care: Did the patient need help bathing, dressing, using the toilet or eating?  Independent  Indoor Mobility: Did the patient need assistance with walking from room to room (with or without device)? Independent  Stairs: Did the patient need assistance with internal or external stairs (with or without device)? Independent  Functional Cognition: Did the patient need help planning regular tasks such as shopping or remembering to take medications? Independent  Current Functional Level Cognition Arousal/Alertness: Awake/alert Overall Cognitive Status: Within Functional Limits for tasks assessed Orientation Level: Oriented to person, Oriented to place, Oriented to situation, Disoriented to time Attention: Sustained Sustained Attention: Appears intact Memory: Appears intact (10- WNL on Cognistat) Awareness: Appears intact Problem Solving: Appears intact Safety/Judgment: Appears intact    Extremity Assessment (includes Sensation/Coordination) Upper Extremity Assessment: Defer to OT evaluation RUE Deficits / Details: Full AROM. Strength grossly 4-/5. Poor fine/gross motor coordination. Pt reports numbness distally. RUE Sensation: decreased light touch RUE Coordination: decreased gross motor, decreased fine motor  Lower Extremity Assessment: RLE deficits/detail RLE Deficits / Details: grossly 3/5 hip, 3-/5 knee, 2/5 ankle with lack of coordination and demonstrable buckling at knee with standing activites needing cues to extend R knee prior to wt beargin RLE Coordination: decreased gross motor   ADLs Overall ADL's : Needs assistance/impaired Eating/Feeding: Set up, Sitting, With adaptive utensils Eating/Feeding Details (indicate cue type and reason): Set pt up with red foam for increased  independence with self feeding. Encouraged use of R hand if possible. Pt able to demo small bites of applesauce using build up handle on spoon. Sitting supported in chair. Grooming: Minimal assistance, Sitting Upper Body Bathing: Minimal assitance, Sitting Lower Body Bathing: Maximal assistance, Sit to/from stand Upper Body Dressing : Minimal assistance, Sitting Upper Body Dressing Details (indicate cue type and reason): Assist to guide RUE into hospital gown. Lower Body Dressing: Maximal assistance, Sit to/from stand Toilet Transfer: Moderate assistance, Stand-pivot, BSC, RW Toilet Transfer Details (indicate cue type and reason): Simulated by stand pivot from EOB to chair Toileting- Clothing Manipulation and Hygiene: Maximal assistance, Sit to/from stand Functional mobility during ADLs: Moderate assistance, Rolling walker (for stand pivot only) General ADL Comments: Encouraged functional use of RUE as much as possible. Discussed post acute rehab and pt is agreeable; very motivated to return to functional independence. Upon sitting EOB; pt noted to be loosing food/liquid from R side of mouth (RN and SLP notified).    Mobility Overal bed mobility: Needs Assistance Bed Mobility: Supine to  Sit Supine to sit: Min assist, HOB elevated General bed mobility comments: able to transition to left EOB with use of rail and multiple effortful but ineffective scoots to get to edge, needs physical assist (draw sheet) and faciliation with instructional cues to reciprocally scoot   Transfers Overall transfer level: Needs assistance Equipment used: 1 person hand held assist, 2 person hand held assist Transfers: Sit to/from Stand, Stand Pivot Transfers Sit to Stand: Mod assist Stand pivot transfers: Mod assist General transfer comment: Manual placement of RLE and for upright posture to facilitate anterior pelvic tilt and thoracic and lumbar extension; cues for anterior weight shift and translation. SPT chair  to/from [facility] Mod A.   Ambulation / Gait / Stairs / Wheelchair Mobility Ambulation/Gait Ambulation/Gait assistance: Total assist, +2 physical assistance Ambulation Distance (Feet): 10 Feet Assistive device: 1 person hand held assist Gait Pattern/deviations: Step-through pattern, Decreased stride length, Ataxic, Narrow base of support, Trunk flexed, Decreased weight shift to right General Gait Details: Pt with heavy left lateral lean with manual cues to advance RLE and stabilize during stance phase; cues for hip/spine extension, tacile cues to promote right knee extension prior to weight shifting. Gait velocity: decreased   Posture / Balance Dynamic Sitting Balance Sitting balance - Comments: Able to sit EOB with cues for upright; manual cues to initiate thoracic and lumbar extension but only able to maintain for a few seconds and fatigues. Kyphotic posture with left lean bias.  Static Standing Balance Single Leg Stance - Right Leg: 0 Single Leg Stance - Left Leg: 0 Rhomberg - Eyes Opened: 0 Balance Overall balance assessment: Needs assistance Sitting-balance support: Feet supported, Single extremity supported Sitting balance-Leahy Scale: Fair Sitting balance - Comments: Able to sit EOB with cues for upright; manual cues to initiate thoracic and lumbar extension but only able to maintain for a few seconds and fatigues. Kyphotic posture with left lean bias.  Postural control: Left lateral lean Standing balance support: During functional activity Standing balance-Leahy Scale: Zero Standing balance comment: Unable to maintain standing unless Max support provided externally to assist with upright posture and balance. Single Leg Stance - Right Leg: 0 Single Leg Stance - Left Leg: 0 Rhomberg - Eyes Opened: 0   Special needs/care consideration BiPAP/CPAP   no CPM  no Continuous Drip IV   no Dialysis   no       Life Vest   no Oxygen   no Special Bed   no Trach Size   no Wound Vac (area)   no       Skin WDL per nursing assessment                             Bowel mgmt: last BM 02/02/16 per pt, continent Bladder mgmt: continent Diabetic mgmt n/a    Previous Home Environment Living Arrangements: Spouse/significant other  Lives With: Spouse Available Help at Discharge: Family, Available 24 hours/day Type of Home: House Home Layout: Two level, Able to live on main level with bedroom/bathroom Home Access: Stairs to enter Enter[employer] CorporationEntrance Stairs-Number of Steps: 2 Bathroom Shower/Tub: Engineer, manufacturing systemsTub/shower unit Bathroom Toilet: Standard Bathroom Accessibility: Yes How Accessible: Accessible via walker Home Care Services: No  Discharge Living Setting Plans for Discharge Living Setting: Patient's home Type of Home at Discharge: House Discharge Home Layout: Two level, Able to live on main level with bedroom/bathroom Discharge Home Access: Stairs to enter Entrance Stairs-Rails: None Entrance Stairs-Number of Steps: 2 Discharge Bathroom Shower/Tub:  Tub/shower unit Discharge Bathroom Toilet: Standard Discharge Bathroom Accessibility: Yes How Accessible: Accessible via walker Does the patient have any problems obtaining your medications?: No  Social/Family/Support Systems Patient Roles: Spouse, Parent Anticipated Caregiver: patient's husband, [name] Lienau (36 year old but active and drives) and daughter/son in law who live immediately beside pt. and her husband.  Daughter is Pasty Arch Anticipated Caregiver's Contact Information: Kyani Simkin, [phone] and Pasty Arch, [phone] Ability/Limitations of Caregiver: Eulalia Ellerman , spouse, is 42 years old but active and walks without a device; daughter Pasty Arch does not work outside of her home and is willing to care for her mother Caregiver Availability: 24/7 Discharge Plan Discussed with Primary Caregiver: Yes Is Caregiver In Agreement with Plan?: Yes Does Caregiver/Family have Issues with Lodging/Transportation while Pt is in  Rehab?: No   Goals/Additional Needs Patient/Family Goal for Rehab: min/mod PT and OT; independent and modified independent SLP Expected length of stay: 17-20 days Cultural Considerations: n/a Dietary Needs: dysphagia 3, nectar thick Equipment Needs: TBA Pt/Family Agrees to Admission and willing to participate: Yes Program Orientation Provided & Reviewed with Pt/Caregiver Including Roles  & Responsibilities: Yes   Decrease burden of Care through IP rehab admission: n/a   Possible need for SNF placement upon discharge: not anticipated   Patient Condition: This patient's condition remains as documented in the consult dated 02/03/16 , in which the Rehabilitation Physician determined and documented that the patient's condition is appropriate for intensive rehabilitative care in an inpatient rehabilitation facility. Will admit to inpatient rehab today.   Preadmission Screen Completed By:  Weldon Picking, 02/03/2016 5:34 PM ______________________________________________________________________   Discussed status with Dr.  Allena Katz on 02/03/16 at  1734  and received telephone approval for admission today.  Admission Coordinator:  Weldon Picking, time 8841 /Date 02/03/16       Cosigned by: [name], MD at 02/03/2016 5:44 PM  Revision History

## 2016-02-04 NOTE — Evaluation (Signed)
Occupational Therapy Assessment and Plan  Patient Details  Name: Kelly [name] MRN: [id number] Date of Birth: [birth year]  OT Diagnosis: abnormal posture, flaccid hemiplegia and hemiparesis, hemiplegia affecting non-dominant side and muscle weakness (generalized) Rehab Potential: Rehab Potential (ACUTE ONLY): Good ELOS: 3-4 weeks   Today's Date: 02/04/2016 OT Individual Time: 1100-1200 OT Individual Time Calculation (min): 60 min      Problem List: Patient Active Problem List   Diagnosis Date Noted  . Cerebrovascular accident (CVA) ([place]) 02/03/2016  . Dysarthria, post-stroke   . Dysphagia, post-stroke   . Leukocytosis   . Prediabetes   . Right hemiparesis ([place])   . Cerebral infarction due to unspecified mechanism   . Other secondary hypertension   . Overactive bladder   . Diastolic dysfunction   . Hyperlipidemia   . Essential hypertension   . Essential tremor   . Stroke (cerebrum) ([place]) 02/01/2016  . Facial droop due to stroke 02/01/2016  . Essential and other specified forms of tremor 02/20/2013  . Dysphonia 02/20/2013  . Trigeminal neuralgia 02/20/2013    Past Medical History:  Past Medical History:  Diagnosis Date  . Dysphonia 02/20/2013  . Essential and other specified forms of tremor 02/20/2013  . Essential tremor   . Osteoporosis   . Psoriasis    Past Surgical History:  Past Surgical History:  Procedure Laterality Date  . APPENDECTOMY  1940  . bladder tack    . CATARACT EXTRACTION Bilateral   . HEMORRHOID SURGERY  1960  . HUMERUS FRACTURE SURGERY    . LAPAROSCOPIC HYSTERECTOMY  2006  . torn rotator cuff    . WRIST FRACTURE SURGERY  2005   seconday shoulder 2001    Assessment & Plan Clinical Impression: [place] Witz Rallsis a 82 y.o.right handed femalewith history of essential tremor. Per chart review patient lives with spouse. 2 level home with bedroom on first floor. 2 steps to entry. Used a walker prior to admission. Presented 02/01/2016 with right side  weakness as well as slurred speech. MRI of the brain showed acute nonhemorrhagic 1.4 cm linear infarct within the posterior limb of the left internal capsule. CTA of head and neck negative. Patient did not receive TPA. Neurology consulted presently on aspirin for CVA prophylaxis. Echocardiogram With no wall motion abnormalities. Grade 1 diastolic dysfunction. Subcutaneous heparin for DVT prophylaxis. Dysphagia #3 nectar thick liquid diet. Physical and occupational therapy evaluationscompleted with recommendations of physical medicine rehabilitation consult.Patient was admitted for a comprehensive rehabilitation program. Patient transferred to CIR on 02/03/2016 .    Patient currently requires max with basic self-care skills secondary to muscle weakness, decreased cardiorespiratoy endurance, abnormal tone, unbalanced muscle activation and decreased coordination, decreased midline orientation and decreased sitting balance, decreased standing balance, decreased postural control, hemiplegia and decreased balance strategies.  Prior to hospitalization, patient could complete ADLs/IADLs with modified independent .  Patient will benefit from skilled intervention to decrease level of assist with basic self-care skills and increase independence with basic self-care skills prior to discharge home with care partner.  Anticipate patient will require minimal physical assistance and follow up home health.  OT - End of Session Activity Tolerance: Tolerates 10 - 20 min activity with multiple rests Endurance Deficit: Yes Endurance Deficit Description: Rest breaks required during bathing/dressing session OT Assessment Rehab Potential (ACUTE ONLY): Good OT Patient demonstrates impairments in the following area(s): Balance;Endurance;Motor;Sensory;Safety;Perception;Pain OT Basic ADL's Functional Problem(s): Eating;Grooming;Bathing;Dressing;Toileting OT Transfers Functional Problem(s): Toilet;Tub/Shower OT Additional  Impairment(s): Fuctional Use of Upper Extremity OT Plan OT Intensity:  Minimum of 1-2 x/day, 45 to 90 minutes OT Frequency: 5 out of 7 days OT Duration/Estimated Length of Stay: 3-4 weeks OT Treatment/Interventions: Balance/vestibular training;Discharge planning;Community reintegration;DME/adaptive equipment instruction;Functional mobility training;Functional electrical stimulation;Neuromuscular re-education;Pain management;Psychosocial support;Patient/family education;Self Care/advanced ADL retraining;Therapeutic Activities;Therapeutic Exercise;UE/LE Strength taining/ROM;UE/LE Coordination activities OT Self Feeding Anticipated Outcome(s): Set-up OT Basic Self-Care Anticipated Outcome(s): Min A OT Toileting Anticipated Outcome(s): Min A OT Bathroom Transfers Anticipated Outcome(s): Min A OT Recommendation Recommendations for Other Services: Neuropsych consult Patient destination: Home Follow Up Recommendations: Home health OT Equipment Recommended: To be determined   Skilled Therapeutic Intervention Pt seen for OT eval and ADL bathing/dressing session. Pt in supine upon arrival, agreeable to tx session. Pt required verbal and tactile cuing for assist to coming to EOB and maintaining midline sitting due to R lean.  Bathing/dressing completed from w/c level at sink, see functional navigator for assist levels for transfers and self-care tasks.  Squat pivot completed to toilet and total A required for toileting tasks due to decreased standing balance and postural control. Obtained new w/c for pt and positioned with R half lap tray. Educated regarding importance of maintaining supported UE position.  Pt left sitting up in w/c at end of session awaiting lunch. Educated regarding use of call bell and left with all needs in reach.  Pt educated throughout session regarding role of PT/OT/ SLP, rehab goals, POC, and d/c planning.   OT Evaluation Precautions/Restrictions  Precautions Precautions:  Fall Precaution Comments: R hemiplegia, R hip bursitis Restrictions Weight Bearing Restrictions: No General Chart Reviewed: Yes Pain Pain Assessment Pain Assessment: No/denies pain Home Living/Prior Functioning Home Living Living Arrangements: Spouse/significant other Available Help at Discharge: Family, Available 24 hours/day Type of Home: House Home Access: Stairs to enter [employer] of Steps: 2 Entrance Stairs-Rails: None (reports she has a post she normally holds to) Home Layout: Two level, Able to live on main level with bedroom/bathroom Bathroom Shower/Tub: Government social research officer Accessibility: Yes  Lives With: Spouse (Husband - [name]) IADL History Homemaking Responsibilities: Yes Current License: Yes Mode of Transportation: Musician Occupation: Retired Tax adviser: Enjoys playing bridge Prior Function Level of Independence: Independent with gait, Independent with basic ADLs, Independent with transfers, Independent with homemaking with ambulation (used rollator for past 2 weeks 2/2 R bursitis pain but pt did not use any AD prior to fall; pt reports her husband cooks, she does light cleaning but has house cleaner come in every other week)  Able to Take Stairs?: Yes Driving: Yes Vocation: Retired (retired Cabin crew) Leisure: Hobbies-yes (Comment) Comments:  (plays duplex bridge with husband 2x/week) Vision/Perception  Vision- History Baseline Vision/History: Wears glasses Wears Glasses: At all times Patient Visual Report: No change from baseline Vision- Assessment Vision Assessment?:  (R inattention)  Cognition Overall Cognitive Status: Within Functional Limits for tasks assessed Arousal/Alertness: Awake/alert Orientation Level: Person;Place;Situation Person: Oriented Place: Oriented Situation: Oriented Year: 2017 Month: September Day of Week: Correct Memory: Appears intact Immediate Memory Recall: Sock;Blue;Bed Memory  Recall: Sock;Blue;Bed Memory Recall Sock: Without Cue Memory Recall Blue: With Cue Memory Recall Bed: With Cue Attention: Focused Sustained Attention: Appears intact Awareness: Appears intact Problem Solving: Appears intact Safety/Judgment: Appears intact Sensation Sensation Light Touch: Appears Intact (BLE) Proprioception: Appears Intact (BLE) Proprioception Impaired Details: Impaired RUE Coordination Gross Motor Movements are Fluid and Coordinated: No Fine Motor Movements are Fluid and Coordinated: No Coordination and Movement Description: R hemiplegia Finger Nose Finger Test: Intact with L UE with tremors which per present prior to stroke  Heel Shin Test:  (limited on RLE by muscle weakness and decreased neuromuscular control) Motor  Motor Motor: Hemiplegia (RUE/LE) Motor - Skilled Clinical Observations: weakness BLE Mobility  Bed Mobility Bed Mobility: Rolling Right;Rolling Left;Supine to Sit;Sit to Supine Rolling Right: 4: Min assist Rolling Left: 2: Max assist Rolling Left Details: Tactile cues for weight shifting;Tactile cues for sequencing;Verbal cues for sequencing;Verbal cues for technique Supine to Sit: 4: Min assist (RLE) Sit to Supine: 4: Min assist Transfers Sit to Stand: 2: Max assist Sit to Stand Details: Verbal cues for sequencing;Verbal cues for technique Stand to Sit: 2: Max assist  Trunk/Postural Assessment  Cervical Assessment Cervical Assessment: Exceptions to [facility] (Flexed) Thoracic Assessment Thoracic Assessment: Exceptions to [facility] (Kyphotic) Lumbar Assessment Lumbar Assessment: Exceptions to [facility] (Posterior pelvic tilt) Postural Control Postural Control: Deficits on evaluation (Verbal and tactile cues for upright posture)  Balance Balance Balance Assessed: Yes Static Sitting Balance Static Sitting - Balance Support: Feet supported;Left upper extremity supported Static Sitting - Level of Assistance: 4: Min assist Static Sitting - Comment/# of  Minutes: Sitting EOB, R lean Dynamic Sitting Balance Dynamic Sitting - Balance Support: Right upper extremity supported;Feet unsupported Dynamic Sitting - Level of Assistance: 5: Stand by assistance Sitting balance - Comments: Sitting EOB, R lean which can self correct with continual VCs Static Standing Balance Static Standing - Balance Support: Left upper extremity supported Static Standing - Level of Assistance: 2: Max assist Dynamic Standing Balance Dynamic Standing - Balance Support: During functional activity;Left upper extremity supported Dynamic Standing - Level of Assistance: 2: Max assist Dynamic Standing - Comments: Standing to complete LB dressing/ toileting tasks Extremity/Trunk Assessment RUE Assessment RUE Assessment: Exceptions to [facility] RUE AROM (degrees) Overall AROM Right Upper Extremity: Deficits (Flaccid; slight shoulder shrug; PROM WNL) RUE Strength RUE Overall Strength:  (Stage 1 brunnstrom; 1/5 elbow flexion; slight shoulder shrug) LUE Assessment LUE Assessment: Within Functional Limits   See Function Navigator for Current Functional Status.   Refer to Care Plan for Long Term Goals  Recommendations for other services: Neuropsych  Discharge Criteria: Patient will be discharged from OT if patient refuses treatment 3 consecutive times without medical reason, if treatment goals not met, if there is a change in medical status, if patient makes no progress towards goals or if patient is discharged from hospital.  The above assessment, treatment plan, treatment alternatives and goals were discussed and mutually agreed upon: by patient  Ernestina Patches 02/04/2016, 4:03 PM

## 2016-02-04 NOTE — Progress Notes (Signed)
Social Work Assessment and Plan  Patient Details  Name: MAHSA HANSER MRN: [id number] Date of Birth: [birth year]  Today's Date: 02/04/2016  Problem List:  Patient Active Problem List   Diagnosis Date Noted  . Cerebrovascular accident (CVA) ([place]) 02/03/2016  . Dysarthria, post-stroke   . Dysphagia, post-stroke   . Leukocytosis   . Prediabetes   . Right hemiparesis ([place])   . Cerebral infarction due to unspecified mechanism   . Other secondary hypertension   . Overactive bladder   . Diastolic dysfunction   . Hyperlipidemia   . Essential hypertension   . Essential tremor   . Stroke (cerebrum) ([place]) 02/01/2016  . Facial droop due to stroke 02/01/2016  . Essential and other specified forms of tremor 02/20/2013  . Dysphonia 02/20/2013  . Trigeminal neuralgia 02/20/2013   Past Medical History:  Past Medical History:  Diagnosis Date  . Dysphonia 02/20/2013  . Essential and other specified forms of tremor 02/20/2013  . Essential tremor   . Osteoporosis   . Psoriasis    Past Surgical History:  Past Surgical History:  Procedure Laterality Date  . APPENDECTOMY  1940  . bladder tack    . CATARACT EXTRACTION Bilateral   . HEMORRHOID SURGERY  1960  . HUMERUS FRACTURE SURGERY    . LAPAROSCOPIC HYSTERECTOMY  2006  . torn rotator cuff    . WRIST FRACTURE SURGERY  2005   seconday shoulder 2001   Social History:  reports that she has never smoked. She has never used smokeless tobacco. She reports that she drinks alcohol. She reports that she does not use drugs.  Family / Support Systems Marital Status: Married How Long?: 11 years Patient Roles: Spouse, Parent Spouse/Significant Other: Akiko Schexnider - husband - [phone] (h); [phone] (c) Children: Cherylann Banas - dtr - [phone] Other Supports: another dtr in [place] and a local son Anticipated Caregiver: patient's husband, [name] Gerald (80 year old but active and drives) and daughter/son in law who live  immediately beside pt. and her husband.  Daughter is Cherylann Banas Ability/Limitations of Caregiver: Collyns Mcquigg , spouse, is 80 years old but active and walks without a device; daughter Cherylann Banas does not work outside of her home and is willing to care for her mother, per Admissions Coordinator. Caregiver Availability: 24/7 Family Dynamics: Pt with good family support.  Social History Preferred language: English Religion: Presbyterian Cultural Background: Pt is a member at [employer] and attends [employer] with her husband. Read: Yes Write: Yes Employment Status: Retired Date Retired/Disabled/Unemployed: 2013 Age Retired: 74 Public relations account executive Issues: none reported Guardian/Conservator: N/A - Per MD, pt is capable of making her own decisions.   Abuse/Neglect Physical Abuse: Denies Verbal Abuse: Denies Sexual Abuse: Denies Exploitation of patient/patient's resources: Denies Self-Neglect: Denies  Emotional Status Pt's affect, behavior and adjustment status: Pt is determined to work to get better, but is very emotional and overwhelmed by the road to recovery.  She is motivated to get better, though, just very tearful about it. Recent Psychosocial Issues: Pt's husband died 32 years ago.  She remarried her current husband 11 years ago. Psychiatric History: none reported Substance Abuse History: none reported  Patient / Family Perceptions, Expectations & Goals Pt/Family understanding of illness & functional limitations: Pt understands her condition and limitations and wants things to improve. Premorbid pt/family roles/activities: Pt was taking walks with her husband, attends church, plays bridge twice a week.  Pt did sustain a fall while dancing  with her husband at a wedding. Anticipated changes in roles/activities/participation: Pt would like to resume activities as she can. Pt/family expectations/goals: Pt wants to get better and return to her  hobbies/activities.  Community [employer] Agencies: None Premorbid Home Care/DME Agencies: Other (Comment) (Pt was using a rollator after the fall.) Transportation available at discharge: family Resource referrals recommended: Neuropsychology, Support group (specify) [facility] Stroke Support Group)  Discharge Planning Living Arrangements: Spouse/significant other Support Systems: Spouse/significant other, Children, Other relatives, Friends/neighbors Type of Residence: Private residence Insurance Resources: [employer], Multimedia programmer (specify) ([place] from deceased husband) Museum/gallery curator Resources: Radio broadcast assistant Screen Referred: No Money Management: Patient, Spouse Does the patient have any problems obtaining your medications?: No Home Management: Pt has a housekeeper every other week to assist with cleaning.  Husband is able to assist also. Patient/Family Preliminary Plans: Pt would like to go home, but recognizes that she may need to go to SNF for a while before returning home. Barriers to Discharge: Steps (2 to enter, can live on first floor) Social Work Anticipated Follow Up Needs: HH/OP, Support Group Expected length of stay: 17-20 days  Clinical Impression CSW met with pt to introduce self and role of CSW, as well as to complete assessment.   Pt was talkative with CSW and also tearful when CSW asked how she was feeling emotionally following the stroke.  She knows she has a long way to go and it overwhelms her at times.  CSW offered support and explained that we will be there for her and can have our neuropsychologist see her.  Pt is open to support and CSW will refer pt for neuropsych visit on 02-06-16.  Pt's husband was not present during visit, but pt reports he comes regularly, so CSW will meet him soon.  Pt is willing to work hard to get better and wants to stay on rehab as long as possible.  She is concerned that she will need more care before she can return  home with 11 y/o husband's help and wonders if she will need to go to a SNF, although husband sounds capable of helping pt.  CSW explained that we will track her progress and wait to see what her needs will be, but should she need SNF or any other d/c planning, CSW assured her that CSW will help with this.  CSW will continue to follow and assist as needed.  Lavell Ridings, Silvestre Mesi 02/04/2016, 2:09 PM

## 2016-02-04 NOTE — IPOC Note (Signed)
Overall Plan of Care [facility](IPOC) Patient Details Name: Kelly [name] MRN: [id number] DOB: [birth year]  Admitting Diagnosis: CVA  Hospital Problems: Active Problems:   Cerebrovascular accident (CVA) (HCC)     Functional Problem List: Nursing Endurance, Safety  PT Balance, Endurance, Motor, Safety, Sensory  OT Balance, Endurance, Motor, Sensory, Safety, Perception, Pain  SLP    TR         Basic ADL's: OT Eating, Grooming, Bathing, Dressing, Toileting     Advanced  ADL's: OT       Transfers: PT Bed Mobility, Bed to Chair, Car, Occupational psychologisturniture  OT Toilet, Research scientist (life sciences)Tub/Shower     Locomotion: PT Ambulation, Psychologist, prison and probation servicesWheelchair Mobility, Stairs     Additional Impairments: OT Fuctional Use of Upper Extremity  SLP Swallowing      TR      Anticipated Outcomes Item Anticipated Outcome  Self Feeding Set-up  Swallowing  Mod I    Basic self-care  Min A  Toileting  Min A   Bathroom Transfers Min A  Bowel/Bladder  Patient will remain continent of bowel & bladder  Transfers  supervision  Locomotion  min assist with LRAD  Communication  Mod I  Cognition     Pain  Pain scale <4  Safety/Judgment  No falls with injury   Therapy Plan: PT Intensity: Minimum of 1-2 x/day ,45 to 90 minutes PT Frequency: 5 out of 7 days PT Duration Estimated Length of Stay: 3-4 weeks OT Intensity: Minimum of 1-2 x/day, 45 to 90 minutes OT Frequency: 5 out of 7 days OT Duration/Estimated Length of Stay: 3-4 weeks SLP Intensity: Minumum of 1-2 x/day, 30 to 90 minutes SLP Frequency: 3 to 5 out of 7 days SLP Duration/Estimated Length of Stay: 10 to 20 days       Team Interventions: Nursing Interventions Medication Management, Psychosocial Support, Dysphagia/Aspiration Precaution Training  PT interventions Disease management/prevention, Functional mobility training, Patient/family education, Splinting/orthotics, Therapeutic Exercise, Visual/perceptual remediation/compensation, UE/LE Coordination activities,  Therapeutic Activities, Pain management, Functional electrical stimulation, Discharge planning, Warden/rangerBalance/vestibular training, Ambulation/gait training, FirefighterCommunity reintegration, Fish farm managerDME/adaptive equipment instruction, Neuromuscular re-education, Psychosocial support, Stair training, UE/LE Strength taining/ROM, Wheelchair propulsion/positioning  OT Interventions Warden/rangerBalance/vestibular training, Discharge planning, Community reintegration, Fish farm managerDME/adaptive equipment instruction, Functional mobility training, Functional electrical stimulation, Neuromuscular re-education, Pain management, Psychosocial support, Patient/family education, Self Care/advanced ADL retraining, Therapeutic Activities, Therapeutic Exercise, UE/LE Strength taining/ROM, UE/LE Coordination activities  SLP Interventions Cueing hierarchy, Patient/family education, Oral motor exercises, Dysphagia/aspiration precaution training, Therapeutic Exercise  TR Interventions    SW/CM Interventions Discharge Planning, Psychosocial Support, Patient/Family Education    Team Discharge Planning: Destination: PT-Home ,OT- Home , SLP-Home Projected Follow-up: PT-Home health PT, 24 hour supervision/assistance, OT-  Home health OT, SLP-None Projected Equipment Needs: PT-To be determined, OT- To be determined, SLP-To be determined Equipment Details: PT- , OT-  Patient/family involved in discharge planning: PT- Patient,  OT-Patient, SLP-Patient  MD ELOS: 21-24 days. Medical Rehab Prognosis:  Good Assessment: 82 y.o.right handed femalewith history of essential tremor.  Used a walker prior to admission. Presented 02/01/2016 with right side weakness as well as slurred speech. MRI of the brain showed acute nonhemorrhagic 1.4 cm linear infarct within the posterior limb of the left internal capsule. CTA of head and neck negative. Patient did not receive TPA. Neurology consulted presently on aspirin for CVA prophylaxis. Echocardiogram With no wall motion abnormalities.  Grade 1 diastolic dysfunction. Dysphagia #3 nectar thick liquid diet. Pt with resulting functional deficits of right sided weakness, dysarthria, dysphagia, and adjustment.  Will set goals for  Min A with PT/OT, Mod I for most tasks with SLP.     See Team Conference Notes for weekly updates to the plan of care

## 2016-02-04 NOTE — Plan of Care (Signed)
Problem: RH Ambulation Goal: LTG Patient will ambulate in controlled environment (PT) LTG: Patient will ambulate in a controlled environment, # of feet with assistance (PT). 50 ft with LRAD Goal: LTG Patient will ambulate in home environment (PT) LTG: Patient will ambulate in home environment, # of feet with assistance (PT). 20 ft with LRAD  Problem: RH Wheelchair Mobility Goal: LTG Patient will propel w/c in controlled environment (PT) LTG: Patient will propel wheelchair in controlled environment, # of feet with assist (PT) 100 ft Goal: LTG Patient will propel w/c in home environment (PT) LTG: Patient will propel wheelchair in home environment, # of feet with assistance (PT). 50 ft   Problem: RH Stairs Goal: LTG Patient will ambulate up and down stairs w/assist (PT) LTG: Patient will ambulate up and down # of stairs with assistance (PT) 2 steps with LRAD (post to hold to at home)

## 2016-02-05 ENCOUNTER — Inpatient Hospital Stay (HOSPITAL_COMMUNITY): Payer: Medicare Other | Admitting: Physical Therapy

## 2016-02-05 ENCOUNTER — Inpatient Hospital Stay (HOSPITAL_COMMUNITY): Payer: Medicare Other

## 2016-02-05 ENCOUNTER — Inpatient Hospital Stay (HOSPITAL_COMMUNITY): Payer: Medicare Other | Admitting: Speech Pathology

## 2016-02-05 ENCOUNTER — Ambulatory Visit (HOSPITAL_COMMUNITY): Payer: Medicare Other | Admitting: Occupational Therapy

## 2016-02-05 ENCOUNTER — Inpatient Hospital Stay (HOSPITAL_COMMUNITY): Payer: Medicare Other | Admitting: *Deleted

## 2016-02-05 DIAGNOSIS — N3281 Overactive bladder: Secondary | ICD-10-CM

## 2016-02-05 LAB — GLUCOSE, CAPILLARY: Glucose-Capillary: 164 mg/dL — ABNORMAL HIGH (ref 65–99)

## 2016-02-05 NOTE — Progress Notes (Addendum)
[facility] Individual Statement of Services  Patient Name:  Kelly HumphreysBennie H Sarinana  Date:  02/05/2016  Welcome [facility] the [facility].  Our goal is to provide you with an individualized program based on your diagnosis and situation, designed to meet your specific needs.  With this comprehensive rehabilitation program, you will be expected to participate in at least 3 hours of rehabilitation therapies Monday-Friday, with modified therapy programming on the weekends.  Your rehabilitation program will include the following services:  Physical Therapy (PT), Occupational Therapy (OT), Speech Therapy (ST), 24 hour per day rehabilitation nursing, Therapeutic Recreaction (TR), Neuropsychology, Case Management (Social Worker), Rehabilitation Medicine, Nutrition Services and Pharmacy Services  Weekly team conferences will be held on Wednesdays to discuss your progress.  Your Social Worker will talk with you frequently to get your input and to update you on team discussions.  Team conferences with you and your family in attendance may also be held.  Expected length of stay: 3 to 4 weeks  Overall anticipated outcome: Minimal assistance  Depending on your progress and recovery, your program may change. Your Social Worker will coordinate services and will keep you informed of any changes. Your Social Worker's name and contact numbers are listed  below.  The following services may also be recommended but are not provided by the [facility]:   Driving Evaluations  Home Health Rehabiltiation Services  Outpatient Rehabilitation Services   Arrangements will be made to provide these services after discharge if needed.  Arrangements include referral to agencies that provide these services.  Your insurance has been verified to be:  Medicare and [employer]  Your primary doctor is:  Dr. Guerry Bruinichard Tisovec  Pertinent information will be shared with your doctor and your  insurance company.  Social Worker:  Staci AcostaJenny Lailana Shira, LCSW  [phone] or [phone]) [phone]  Information discussed with and copy given to patient by: Elvera LennoxPrevatt, Djuana Littleton Capps, 02/05/2016, 9:48 AM

## 2016-02-05 NOTE — Progress Notes (Signed)
[place] PHYSICAL MEDICINE & REHABILITATION     PROGRESS NOTE  Subjective/Complaints:  Pt seen sitting up in bed this AM. She had a good first day of therapies and is asks what she can do to get her hand moving again.   ROS: Denies CP, SOB, N/V/D  Objective: Vital Signs: Blood pressure (!) 117/57, pulse 69, temperature 98.2 F (36.8 C), temperature source Oral, resp. rate 16, weight 48 kg (105 lb 13.1 oz), SpO2 98 %. No results found.  Recent Labs  02/04/16 0713  WBC 8.4  HGB 13.0  HCT 41.0  PLT 295    Recent Labs  02/04/16 0713  NA 141  K 4.0  CL 110  GLUCOSE 96  BUN 17  CREATININE 0.73  CALCIUM 9.0   CBG (last 3)  No results for input(s): GLUCAP in the last 72 hours.  Wt Readings from Last 3 Encounters:  02/05/16 48 kg (105 lb 13.1 oz)  02/01/16 52.2 kg (115 lb)  02/20/13 56.7 kg (125 lb)    Physical Exam:  BP (!) 117/57 (BP Location: Left Arm)   Pulse 69   Temp 98.2 F (36.8 C) (Oral)   Resp 16   Wt 48 kg (105 lb 13.1 oz)   SpO2 98%   BMI 20.67 kg/m  Constitutional: She appears well-developed. Frail HENT: Normocephalicand atraumatic.  Eyes: EOMand Conj are normal.  Cardiovascular: Normal rateand regular rhythm.  Respiratory: Effort normaland breath sounds normal. No respiratory distress.  GI: Soft. Bowel sounds are normal. She exhibits no distension.  Musculoskeletal: She exhibits no edemaor tenderness.  Neurological: She is alert and oriented.  Moderate dysarthria.  Follows simple commands.  Fair awareness of deficits Right facial droop +Resting tremor DTRs symmetric Motor: RUE: shoulder abduction, elbow flexion/extension 1+/5, wrist, hand 0/5 RLE: 4/5 hip flexion, knee extension, 4+/5 ankle dorsi/plantar flexion LUE/LLE: 5/5 proximal to distal Psychiatric: She has a normal mood and affect. Her behavior is normal Skin: Skin is warmand dry   Assessment/Plan: 1. Functional deficits secondary to linear infarct within the posterior  limb of the left internal capsule which require 3+ hours per day of interdisciplinary therapy in a comprehensive inpatient rehab setting. Physiatrist is providing close team supervision and 24 hour management of active medical problems listed below. Physiatrist and rehab team continue to assess barriers to discharge/monitor patient progress toward functional and medical goals.  Function:  Bathing Bathing position   Position: Wheelchair/chair at sink  Bathing parts Body parts bathed by patient: Right arm, Chest, Abdomen, Right upper leg, Left upper leg, Right lower leg, Left lower leg Body parts bathed by helper: Left arm, Front perineal area, Buttocks, Back  Bathing assist        Upper Body Dressing/Undressing Upper body dressing   What is the patient wearing?: Pull over shirt/dress     Pull over shirt/dress - Perfomed by patient: Put head through opening Pull over shirt/dress - Perfomed by helper: Thread/unthread right sleeve, Thread/unthread left sleeve, Pull shirt over trunk        Upper body assist        Lower Body Dressing/Undressing Lower body dressing   What is the patient wearing?: Pants, Non-skid slipper socks       Pants- Performed by helper: Thread/unthread right pants leg, Thread/unthread left pants leg, Pull pants up/down   Non-skid slipper socks- Performed by helper: Don/doff right sock, Don/doff left sock                  Lower  body assist        Toileting Toileting   Toileting steps completed by patient: Performs perineal hygiene (Urine only; assist following BM required) Toileting steps completed by helper: Adjust clothing prior to toileting, Adjust clothing after toileting, Performs perineal hygiene Toileting Assistive Devices: Grab bar or rail  Toileting assist Assist level: Two helpers   Transfers Chair/bed transfer   Chair/bed transfer method: Squat pivot Chair/bed transfer assist level: Moderate assist (Pt 50 - 74%/lift or  lower) Chair/bed transfer assistive device: Armrests     Locomotion Ambulation     Max distance: 20 ft Assist level: Maximal assist (Pt 25 - 49%)   Wheelchair   Type: Manual Max wheelchair distance: 115 ft Assist Level: Moderate assistance (Pt 50 - 74%)  Cognition Comprehension Comprehension assist level: Understands complex 90% of the time/cues 10% of the time  Expression Expression assist level: Expresses complex 90% of the time/cues < 10% of the time  Social Interaction Social Interaction assist level: Interacts appropriately with others with medication or extra time (anti-anxiety, antidepressant).  Problem Solving Problem solving assist level: Solves complex problems: Recognizes & self-corrects  Memory Memory assist level: Recognizes or recalls 90% of the time/requires cueing < 10% of the time     Medical Problem List and Plan: 1.  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  DVT Prophylaxis/Anticoagulation: Subcutaneous heparin. Monitor platelet counts and any signs of bleeding 3. Pain Management: Tylenol as needed 4. Dysphagia. Dysphagia #3 nectar liquids. Monitor hydration. Follow-up speech therapy 5. Neuropsych: This patient is capable of making decisions on her own behalf. 6. Skin/Wound Care: Routine skin checks 7. Fluids/Electrolytes/Nutrition: Routine I&O   BMP WNL on 9/12 8. Permissive hypertension: Controlled at present. 9. Essential tremor. Patient maintained on Topamax per family 10. Hyperlipidemia. Lipitor 11. Overactive bladder. Ditropan 5 mg daily at bedtime. PVR 3 pending 12. Leukocytosis:   WBCs 8.4 on 9/12 13. Prediabetes:    Glucose WNL at present 14. Diastolic dysfunction: Monitor for fluid overload  LOS (Days) 2 A FACE TO FACE EVALUATION WAS PERFORMED  Erico Stan Karis Juba 02/05/2016 8:31 AM

## 2016-02-05 NOTE — Progress Notes (Signed)
Occupational Therapy Note  Patient Details  Name: Kelly [name] MRN: [id number] Date of Birth: [birth year]  Today's Date: 02/05/2016 OT Individual Time: 1130-1200 OT Individual Time Calculation (min): 30 min    Pt denied pain Individual Therapy   Pt resting in w/c upon arrival with husband present.  Initial focus on sitting balance, sti<>stand, and standing balance.  Pt requires max verbal cues for sitting posture/balance and max A for sit<>stand/standing balance.  Pt transitioned to RUE activities with focus on initiation of shoulder flexion/extension, elbow flexion/extension, shoulder abdutction/adduction. Pt noted with active movement with shoulder flexion/extension (max verbal cues for isolation of movement), and abduction/adduction.  Elbow flexion/extension and gross grasp absent at this time.  Pt remained in w/c with half lap tray in place and husband present.   Kelly [name], Kelly [name] 02/05/2016, 12:19 PM

## 2016-02-05 NOTE — Progress Notes (Signed)
Recreational Therapy Assessment and Plan  Patient Details  Name: Kelly [name] MRN: [id number] Date of Birth: 22-Jul-1927 Today's Date: 02/05/2016  Rehab Potential: Good ELOS: 3-4 weeks   Assessment Clinical Impression: Problem List:      Patient Active Problem List   Diagnosis Date Noted  . Cerebrovascular accident (CVA) (HCC) 02/03/2016  . Dysarthria, post-stroke   . Dysphagia, post-stroke   . Leukocytosis   . Prediabetes   . Right hemiparesis (HCC)   . Cerebral infarction due to unspecified mechanism   . Other secondary hypertension   . Overactive bladder   . Diastolic dysfunction   . Hyperlipidemia   . Essential hypertension   . Essential tremor   . Stroke (cerebrum) (HCC) 02/01/2016  . Facial droop due to stroke 02/01/2016  . Essential and other specified forms of tremor 02/20/2013  . Dysphonia 02/20/2013  . Trigeminal neuralgia 02/20/2013    Past Medical History:      Past Medical History:  Diagnosis Date  . Dysphonia 02/20/2013  . Essential and other specified forms of tremor 02/20/2013  . Essential tremor   . Osteoporosis   . Psoriasis    Past Surgical History:  Past Surgical History:  Procedure Laterality Date  . APPENDECTOMY  1940  . bladder tack    . CATARACT EXTRACTION Bilateral   . HEMORRHOID SURGERY  1960  . HUMERUS FRACTURE SURGERY    . LAPAROSCOPIC HYSTERECTOMY  2006  . torn rotator cuff    . WRIST FRACTURE SURGERY  2005   seconday shoulder 2001    Assessment & Plan Clinical Impression: Patient is a 82 y.o. year old female, right handed, with history of essential tremor. Per chart review patient lives with spouse. 2 level home with bedroom on first floor. 2 steps to entry. Used a walker prior to admission. Presented 02/01/2016 with right side weakness as well as slurred speech. MRI of the brain showed acute nonhemorrhagic 1.4 cm linear infarct within the posterior limb of the left internal capsule. CTA of head  and neck negative. Patient did not receive TPA. Neurology consulted presently on aspirin for CVA prophylaxis. Echocardiogram With no wall motion abnormalities. Grade 1 diastolic dysfunction. Subcutaneous heparin for DVT prophylaxis. Dysphagia #3 nectar thick liquid diet. Physical and occupational therapy evaluationscompleted with recommendations of physical medicine rehabilitation consult.Patient was admitted for a comprehensive rehabilitation program.  Patient transferred to CIR on 02/03/2016.   Pt presents with decreased activity tolerance, decreased functional mobility, decreased balance, decreased coordination, right inattention Limiting pt's independence with leisure/community pursuits.  Leisure History/Participation Premorbid leisure interest/current participation: Games - Cards;Sports - Exercise (Comment);Community - Press photographer - Journalist, newspaper - Travel (Comment) (bridge; walking at the park) Other Leisure Interests: Television;Housework;Reading Leisure Participation Style: Alone;With Family/Friends Awareness of Community Resources: Excellent Psychosocial / Spiritual Spiritual Interests: Church;Womens'Men's Groups Patient agreeable to Pet Therapy: Yes Does patient have pets?: No Social interaction - Mood/Behavior: Cooperative Film/video editor for Education?: Yes Recreational Therapy Orientation Orientation -Reviewed with patient: Available activity resources Strengths/Weaknesses Patient Strengths/Abilities: Willingness to participate;Active premorbidly Patient weaknesses: Physical limitations TR Patient demonstrates impairments in the following area(s): Endurance;Motor;Safety  Plan Rec Therapy Plan Is patient appropriate for Therapeutic Recreation?: Yes Rehab Potential: Good Treatment times per week: Min 1 time per week >20 minutes Estimated Length of Stay: 3-4 weeks TR Treatment/Interventions: Adaptive equipment instruction;1:1  session;Balance/vestibular training;Functional mobility training;Community reintegration;Patient/family education;Therapeutic activities;Recreation/leisure participation;UE/LE Coordination activities;Therapeutic exercise;Wheelchair propulsion/positioning  Recommendations for other services: Neuropsych  Discharge Criteria: Patient will be discharged from TR if  patient refuses treatment 3 consecutive times without medical reason.  If treatment goals not met, if there is a change in medical status, if patient makes no progress towards goals or if patient is discharged from hospital.  The above assessment, treatment plan, treatment alternatives and goals were discussed and mutually agreed upon: by patient  Adrian 02/05/2016, 3:54 PM

## 2016-02-05 NOTE — Progress Notes (Signed)
Occupational Therapy Session Note  Patient Details  Name: Kelly [name] MRN: [id number] Date of Birth: 1928-04-29  Today's Date: 02/05/2016 OT Individual Time: 0900-1000 OT Individual Time Calculation (min): 60 min    Short Term Goals: Week 1:  OT Short Term Goal 1 (Week 1): Pt will recall hemi-dressing techniques 2 consecutive days with min questioning cues OT Short Term Goal 2 (Week 1): Pt will complete stand pivot toilet transfer with mod A in order to decrease caregiver burden OT Short Term Goal 3 (Week 1): Pt will dress UB with min A in order to reduce caregiver burden OT Short Term Goal 4 (Week 1): Pt will maintain static standing balance for 1 minutes with min A at RW/ sink in prep for functional task  Skilled Therapeutic Interventions/Progress Updates:    Pt seen for skilled OT with focus on bathing/dressing modifications, squat-pivot transfers, sit<>stands, R UE NMRe-ed, and RUE ROM. Mod A squat-pivot from bed>w/c. Pt completed bathing and dressing at the sink, w/ Mod A. She required Mod/Min A to stand using sink for support while OT washed buttocks and donned brief dependently. Min A for standing balance initially, progressing to Mod A with fatigue. OT educated pt on self-ROM, then OT addressed functional use of R UE utilizing NMR-ed techniques, joint compression, and weight bearing techniques. Pt left seated in recliner at end of session with needs met and R UE placed on armrest table.   Therapy Documentation Precautions:  Precautions Precautions: Fall Precaution Comments: R hemiplegia, R hip bursitis Restrictions Weight Bearing Restrictions: No Pain:  none/denies pain  See Function Navigator for Current Functional Status.   Therapy/Group: Individual Therapy  [name] 02/05/2016, 4:08 PM

## 2016-02-05 NOTE — Progress Notes (Signed)
Physical Therapy Session Note  Patient Details  Name: Kelly [name] MRN: [id number] Date of Birth: 01-07-1928  Today's Date: 02/05/2016 PT Individual Time: [id number] PT Individual Time Calculation (min): 55 min    Short Term Goals: Week 1:  PT Short Term Goal 1 (Week 1): Pt will ambulate 30 ft with LRAD & mod assist. PT Short Term Goal 2 (Week 1): Pt will perform bed<>w/c transfers with min assist. PT Short Term Goal 3 (Week 1): Pt will demonstrate standing balance with mod assist.  Skilled Therapeutic Interventions/Progress Updates:    Pt received in w/c & agreeable to PT, denying c/o pain. Pt's husband present for entire session; oriented pt & husband to unit. Gait training in hallway with L rail with cuing for upright posture and forward gaze. Pt ambulated with +2 assist (mod/max assist + w/c follow for safety) x 30 ft + 30 ft. Pt able to activate RLE & complete step but requires assistance for R foot clearance and placement on floor. Therapist provided facilitation for weight shift R. Pt completed stand pivot transfer with mod assistance and max verbal cuing for sequencing and assistance to advance RLE. Utilized cybex kinetron in sitting for neuro re-ed of RLE & BLE strengthening. Instructed pt on use of dynavision to address R inattention in forward facing and sitting laterally to board from w/c level. Pt with slightly decreased reaction time to R side of board (0.2 seconds). At end of session pt left sitting in w/c in room with all needs within reach & husband present.   Therapy Documentation Precautions:  Precautions Precautions: Fall Precaution Comments: R hemiplegia, R hip bursitis Restrictions Weight Bearing Restrictions: No   See Function Navigator for Current Functional Status.   Therapy/Group: Individual Therapy  Sandi MariscalVictoria M Zeferino Mounts 02/05/2016, 5:35 PM

## 2016-02-05 NOTE — Progress Notes (Signed)
Speech Language Pathology Daily Session Note  Patient Details  Name: Kelly [name] MRN: [id number] Date of Birth: [birth year]  Today's Date: 02/05/2016 SLP Individual Time: 1000-1100 SLP Individual Time Calculation (min): 60 min   Short Term Goals: Week 1: SLP Short Term Goal 1 (Week 1): Pt will perform pharyngeal strengthening exercises with Min A verbal cues to strengthen swallow to demonstrate readiness for diet upgrade.  SLP Short Term Goal 2 (Week 1): Pt will consume trials of thin liquids without overt s/s of aspiration with Min A verbal cues for use of swallowing compensatory strategies.  SLP Short Term Goal 3 (Week 1): Pt will demonstrate efficient mastication and oral clearance of regular textures over 3 observed sessions to demonstrate readiness for diet advancment.  SLP Short Term Goal 4 (Week 1): Pt will utilize speech intelligibility strategies at the sentence level with Min A verbal cues to achieve 100% intelligibility.   Skilled Therapeutic Interventions: Skilled treatment session focused on dysphagia and speech goals. SLP facilitated session by providing trials of thin water after oral care. Pt consumed trials of ice chips via spoon without any overt s/s of aspiration (improvement over previous session). Pt self-fed rim sips of thin water via cup. Pt without any overt s/s of aspiration (improvement over previous day). Pt consumed trials of regular diet with good lingual manipulation of bolus, timely oral prep and complete oral clearing. Pt with good use of lingual sweep to double check buccal residue. ST introduced pharyngeal strengthening exercises ([place]). Pt able to complete 1 repetition. ST left handout with pt on speech intelligibility strategies. Pt able to implement strategies independently at the word level with 100% intelligibility. When using sentences to describe barrier game pt required Min A verbal cues to achieve 75% intelligibility. Pt returned to room and left in  wheelchair with all needs within reach. Continue current plan of care.   Function:  Eating Eating   Modified Consistency Diet: No Eating Assist Level: Supervision or verbal cues;Helper checks for pocketed food;Set up assist for   Eating Set Up Assist For: Opening containers;Cutting food       Cognition Comprehension Comprehension assist level: Understands complex 90% of the time/cues 10% of the time  Expression   Expression assist level: Expresses complex 90% of the time/cues < 10% of the time  Social Interaction Social Interaction assist level: Interacts appropriately with others with medication or extra time (anti-anxiety, antidepressant).  Problem Solving Problem solving assist level: Solves complex problems: Recognizes & self-corrects  Memory Memory assist level: Recognizes or recalls 90% of the time/requires cueing < 10% of the time    Pain    Therapy/Group: Individual Therapy  Hammad Finkler 02/05/2016, 12:40 PM

## 2016-02-06 ENCOUNTER — Inpatient Hospital Stay (HOSPITAL_COMMUNITY): Payer: Medicare Other | Admitting: Speech Pathology

## 2016-02-06 ENCOUNTER — Inpatient Hospital Stay (HOSPITAL_COMMUNITY): Payer: Medicare Other | Admitting: Physical Therapy

## 2016-02-06 ENCOUNTER — Inpatient Hospital Stay (HOSPITAL_COMMUNITY): Payer: Medicare Other | Admitting: Occupational Therapy

## 2016-02-06 ENCOUNTER — Encounter (HOSPITAL_COMMUNITY): Payer: Medicare Other

## 2016-02-06 DIAGNOSIS — F4321 Adjustment disorder with depressed mood: Secondary | ICD-10-CM

## 2016-02-06 NOTE — Progress Notes (Signed)
Occupational Therapy Session Note  Patient Details  Name: Kelly [name] MRN: [id number] Date of Birth: 11-16-1927  Today's Date: 02/06/2016 OT Individual Time: 0900-1000 OT Individual Time Calculation (min): 60 min     Short Term Goals: Week 1:  OT Short Term Goal 1 (Week 1): Pt will recall hemi-dressing techniques 2 consecutive days with min questioning cues OT Short Term Goal 2 (Week 1): Pt will complete stand pivot toilet transfer with mod A in order to decrease caregiver burden OT Short Term Goal 3 (Week 1): Pt will dress UB with min A in order to reduce caregiver burden OT Short Term Goal 4 (Week 1): Pt will maintain static standing balance for 1 minutes with min A at RW/ sink in prep for functional task  Skilled Therapeutic Interventions/Progress Updates:    1:1 OT session focused on bathing/dressing modifications, NMRe-ed of R Ue, one-handed, dynamic standing balance, and functional use of R UE . Pt required Max A for LB dressing and Mod A for UB dressing. Min/Mod A sit<>stand, Min/Mod A for dynamic standing balance reaching outside base of support.   Therapy Documentation Precautions:  Precautions Precautions: Fall Precaution Comments: R hemiplegia, R hip bursitis Restrictions Weight Bearing Restrictions: No Pain: Pain Assessment Pain Assessment: No/denies pain ADL: See Function Navigator for Current Functional Status.   Therapy/Group: Individual Therapy  Kelly [name] 02/06/2016, 4:46 PM

## 2016-02-06 NOTE — Progress Notes (Signed)
Social Work Patient ID: Kelly [name], female   DOB: 10-Oct-1927, 82 y.o.   MRN: [id number]   Nigel Sloop, LCSW Social Worker Signed   Patient Care Conference Date of Service: 02/06/2016 10:38 AM      Hide copied text Hover for attribution information Inpatient RehabilitationTeam Conference and Plan of Care Update Date: 02/05/2016   Time: 2:00 PM      Patient Name: Kelly [name]      Medical Record Number: [id number]  Date of Birth: [birth year] Sex: Female         Room/Bed: 4W10C/4W10C-01 Payor Info: Payor: MEDICARE / Plan: MEDICARE PART A AND B / Product Type: *No Product type* /     Admitting Diagnosis: CVA  Admit Date/Time:  02/03/2016  6:46 PM Admission Comments: No comment available    Primary Diagnosis:  <principal problem not specified> Principal Problem: <principal problem not specified>       Patient Active Problem List    Diagnosis Date Noted  . Adjustment disorder with depressed mood    . Cerebrovascular accident (CVA) (HCC) 02/03/2016  . Dysarthria, post-stroke    . Dysphagia, post-stroke    . Leukocytosis    . Prediabetes    . Right hemiparesis (HCC)    . Cerebral infarction due to unspecified mechanism    . Other secondary hypertension    . Overactive bladder    . Diastolic dysfunction    . Hyperlipidemia    . Essential hypertension    . Essential tremor    . Stroke (cerebrum) (HCC) 02/01/2016  . Facial droop due to stroke 02/01/2016  . Essential and other specified forms of tremor 02/20/2013  . Dysphonia 02/20/2013  . Trigeminal neuralgia 02/20/2013      Expected Discharge Date: Expected Discharge Date: 02/27/16   Team Members Present: Physician leading conference: Dr. Maryla Morrow Social Worker Present: Staci Acosta, LCSW Nurse Present: Chana Bode, RN;Other (comment) Cheri Guppy, RN) PT Present: Grier Rocher, PT;Victoria Hyacinth Meeker, PT OT Present: Roney Mans, OT;Other (comment) Gentry Fitz Doe, OT) SLP Present: Feliberto Gottron, SLP PPS  Coordinator present : Edson Snowball, PT       Current Status/Progress Goal Weekly Team Focus  Medical   Right-sided weakness, dysarthria, dysphasia secondary to linear infarct within the posterior limb of the left internal capsule  Improve mobility, transfers, monitor bladder  See above   Bowel/Bladder   cont x2. on ditropan. LBM: 9/12 (small)   remain cont x2   continue with plan of care   Swallow/Nutrition/ Hydration   Dysphagia 3 with nectar thick - doing well with trials of thin liquids - intermittent supervision  Mod I with regular and thin liquids  Trials of regular and thin liquids   ADL's   Mod-max A overall  Min A overall  ADL re-trianing; sitting/ standing balance; neuro re-ed; education; activity tolerance   Mobility   mod assist for overall transfers (squat/modified stand pivot), max assist for sit<>stand and ambulate 20 ft with L rail in hallway, mod assist for w/c mobility, poor endurance with activity  supervision bed<>w/c transfer, min assist car transfer, min assist standing balance, ambulate 50 ft with min assist & LRAD, supervision for w/c mobility, min assist to negotiate 2 steps  pt education, transfers, R UE/LE neuromuscular re-education, activity tolerance, gait training   Communication   decreased intelligibility at the simple conversation level  Mod I  intelligibility strategies - work on sentence to simple conversation level   Tax adviser Observations  Pain   no c/o pain   no pain   assess and treat qshift and PRN    Skin   bruising to left hip   remain free from further breakdown/infection while on rehab   assess and monitor findings qshift and PRN      Rehab Goals Patient on target to meet rehab goals: Yes Rehab Goals Revised: none - pt's 1st conference and pt just admitted 9-11-7 *See Care Plan and progress notes for long and short-term goals.   Barriers to Discharge: Safety, dysphagia, transfers, mobility, overactive bladder     Possible Resolutions to Barriers:  therapies, PVRs   Discharge Planning/Teaching Needs:  Pt to go home with her husband to provide supervision/min assist care.  Husband can come for family education closer to pt's d/c.   Team Discussion:  Pt is medically stable.  She has recently begun Prozac and Dr. Allena KatzPatel would like her PVRs checked.  Pt is getting return back in her right leg, but not much in her right arm.  Pt gets fatigued, but is motivated and trying hard in therapies.  She has min assist ambulation goals.  Pt is on D3 diet with nectar thick liquids.  If she does a teaspoon or larger of thin liquids, she coughs.  Pt to be seen by neuropsychology on 02-06-16, as she is tearful and makes comments about not "being here anymore."  Revisions to Treatment Plan:  None     Continued Need for Acute Rehabilitation Level of Care: The patient requires daily medical management by a physician with specialized training in physical medicine and rehabilitation for the following conditions: Daily direction of a multidisciplinary physical rehabilitation program to ensure safe treatment while eliciting the highest outcome that is of practical value to the patient.: Yes Daily medical management of patient stability for increased activity during participation in an intensive rehabilitation regime.: Yes Daily analysis of laboratory values and/or radiology reports with any subsequent need for medication adjustment of medical intervention for : Neurological problems   Issiac Jamar, Vista DeckJennifer Capps 02/06/2016, 10:38 AM

## 2016-02-06 NOTE — Patient Care Conference (Signed)
Inpatient RehabilitationTeam Conference and Plan of Care Update Date: 02/05/2016   Time: 2:00 PM    Patient Name: Kelly [name]      Medical Record Number: [id number]  Date of Birth: 25-Dec-1927 Sex: Female         Room/Bed: 4W10C/4W10C-01 Payor Info: Payor: MEDICARE / Plan: MEDICARE PART A AND B / Product Type: *No Product type* /    Admitting Diagnosis: CVA  Admit Date/Time:  02/03/2016  6:46 PM Admission Comments: No comment available   Primary Diagnosis:  <principal problem not specified> Principal Problem: <principal problem not specified>  Patient Active Problem List   Diagnosis Date Noted  . Adjustment disorder with depressed mood   . Cerebrovascular accident (CVA) (HCC) 02/03/2016  . Dysarthria, post-stroke   . Dysphagia, post-stroke   . Leukocytosis   . Prediabetes   . Right hemiparesis (HCC)   . Cerebral infarction due to unspecified mechanism   . Other secondary hypertension   . Overactive bladder   . Diastolic dysfunction   . Hyperlipidemia   . Essential hypertension   . Essential tremor   . Stroke (cerebrum) (HCC) 02/01/2016  . Facial droop due to stroke 02/01/2016  . Essential and other specified forms of tremor 02/20/2013  . Dysphonia 02/20/2013  . Trigeminal neuralgia 02/20/2013    Expected Discharge Date: Expected Discharge Date: 02/27/16  Team Members Present: Physician leading conference: Dr. Maryla [name] Social Worker Present: Kelly Acosta, LCSW Nurse Present: Kelly Bode, RN;Other (comment) Kelly Guppy, RN) PT Present: Kelly [name], PT;Kelly [name], PT OT Present: Kelly [name], OT;Other (comment) Kelly [name], OT) SLP Present: Kelly [name], SLP PPS Coordinator present : Kelly [name], PT     Current Status/Progress Goal Weekly Team Focus  Medical   Right-sided weakness, dysarthria, dysphasia secondary to linear infarct within the posterior limb of the left internal capsule  Improve mobility, transfers, monitor bladder  See above    Bowel/Bladder   cont x2. on ditropan. LBM: 9/12 (small)   remain cont x2   continue with plan of care   Swallow/Nutrition/ Hydration   Dysphagia 3 with nectar thick - doing well with trials of thin liquids - intermittent supervision  Mod I with regular and thin liquids  Trials of regular and thin liquids   ADL's   Mod-max A overall  Min A overall  ADL re-trianing; sitting/ standing balance; neuro re-ed; education; activity tolerance   Mobility   mod assist for overall transfers (squat/modified stand pivot), max assist for sit<>stand and ambulate 20 ft with L rail in hallway, mod assist for w/c mobility, poor endurance with activity  supervision bed<>w/c transfer, min assist car transfer, min assist standing balance, ambulate 50 ft with min assist & LRAD, supervision for w/c mobility, min assist to negotiate 2 steps  pt education, transfers, R UE/LE neuromuscular re-education, activity tolerance, gait training   Communication   decreased intelligibility at the simple conversation level  Mod I  intelligibility strategies - work on sentence to simple conversation level   Safety/Cognition/ Behavioral Observations            Pain   no c/o pain   no pain   assess and treat qshift and PRN    Skin   bruising to left hip   remain free from further breakdown/infection while on rehab   assess and monitor findings qshift and PRN     Rehab Goals Patient on target to meet rehab goals: Yes Rehab Goals Revised: none - pt's 1st conference and pt  just admitted 9-11-7 *See Care Plan and progress notes for long and short-term goals.  Barriers to Discharge: Safety, dysphagia, transfers, mobility, overactive bladder    Possible Resolutions to Barriers:  therapies, PVRs    Discharge Planning/Teaching Needs:  Pt to go home with her husband to provide supervision/min assist care.  Husband can come for family education closer to pt's d/c.   Team Discussion:  Pt is medically stable.  She has recently  begun Prozac and Dr. Allena KatzPatel would like her PVRs checked.  Pt is getting return back in her right leg, but not much in her right arm.  Pt gets fatigued, but is motivated and trying hard in therapies.  She has min assist ambulation goals.  Pt is on D3 diet with nectar thick liquids.  If she does a teaspoon or larger of thin liquids, she coughs.  Pt to be seen by neuropsychology on 02-06-16, as she is tearful and makes comments about not "being here anymore."  Revisions to Treatment Plan:  None    Continued Need for Acute Rehabilitation Level of Care: The patient requires daily medical management by a physician with specialized training in physical medicine and rehabilitation for the following conditions: Daily direction of a multidisciplinary physical rehabilitation program to ensure safe treatment while eliciting the highest outcome that is of practical value to the patient.: Yes Daily medical management of patient stability for increased activity during participation in an intensive rehabilitation regime.: Yes Daily analysis of laboratory values and/or radiology reports with any subsequent need for medication adjustment of medical intervention for : Neurological problems  Kelly [name], Vista DeckJennifer [name] 02/06/2016, 10:38 AM

## 2016-02-06 NOTE — Progress Notes (Signed)
Social Work Patient ID: Kelly [name], female   DOB: [birth year], 82 y.o.   MRN: [id number]   CSW met with pt on 02-05-16 to update her on team conference discussion.  Pt's husband had already heard pt's d/c date of 02-27-16 and he had left for the day.  Pt reports that both she and husband are pleased that she will be able to stay until then.  Pt has seen progress, but knows she will need much longer to recover more.  Pt was encouraged by d/c date and thinks she may be more confident in going home with a longer rehab program first.  CSW shared with pt that neuropsychologist will be coming to see her on 02-06-16 to support her.  Pt was receptive.  CSW will continue to follow pt and talk with husband about d/c plans.

## 2016-02-06 NOTE — Progress Notes (Signed)
Physical Therapy Note  Patient Details  Name: Kelly [name] MRN: [id number] Date of Birth: 02/21/1928 Today's Date: 02/06/2016    Time: 1430-1501 31 minutes  1:1 No c/o pain. Pt states she was fatigued from previous PT session but wanted to work on her arm. NMR performed for activation of Rt shoulder and scapular mm with pt able to initiate some shoulder flexion and internal rotation.  Pt pleased with progress.   [name] 02/06/2016, 3:04 PM

## 2016-02-06 NOTE — Progress Notes (Signed)
Chaplain provided spiritual care support for this very pleasant patient. Chaplain offered a listening presence,  offered words of encouragement and a prayer of peace, and comfort, and healing The patient was appreciative of the visit and requested Chaplain come back to visit her. Chaplain Janell QuietAudrey Brenn Deziel [phone]

## 2016-02-06 NOTE — Consult Note (Signed)
PSYCHODIAGNOSTIC EVALUATION - [facility]   [name] is an 82 year old woman, who was seen for an initial psychodiagnostic examination to assess for potential depression, anxiety, or other mental illness in the setting of stroke.    During the session, Kelly [name] initially reported that she was doing "great."  She noted that she feels fortunate to be able to be on the rehabilitation unit especially since her discharge date was set a few weeks in the future, which she believes will give her plenty of time to regain her strength and some of her abilities.  She was highly complementary of staff and facilities.  She commented that she initially felt depressed after the stroke, but feels as though her mood has improved and overall, she now feels optimistic and generally positive about her recovery.  When she was feeling more down, she said that it would have been "easier for everybody" if the stroke had killed her.  However, she stated that she does not have suicidal ideation and is warming up to the idea that possibly she was left on this Earth for a reason.  Historically, she has dealt with significant stress by relying on her family for support and she feels as though she has significant support now from family and friends.  [name] denied trouble sleeping, but noted that her appetite is poor.  She denied significant worry at this point, though she acknowledged mild concern over things that are not getting done at home and regarding her son's upcoming surgery to remove a benign growth on his neck.  Time was spent today processing [name]' emotions surrounding her stroke, including guilt, blame, anger, and frustration.  She said that she felt relieved after discussing the range of her emotions with the neuropsychologist.  Her reactions were normalized and she was provided with validation for all of her emotions.    From a cognitive perspective, she denied  noticing changes in memory, processing speed, or other cognitive abilities.  She was observed to be a good historian and no cognitive issues were apparent during the clinical interview.       IMPRESSION:  Kelly [name] acknowledged some depressed mood immediately following her stroke and it is in line with an adjustment reaction to illness.  Although her mood seems to be somewhat improved now, she still has some mild residual low mood as part of the adjustment reaction.  Additional neuropsychological support could be requested from the treatment team should they feel as though it would be beneficial in informing care or providing additional psychosocial support.    DIAGNOSES:   Stroke Adjustment disorder with depressed mood  Leavy CellaKaren Tracye Szuch, Psy.D.  Clinical Neuropsychologist

## 2016-02-06 NOTE — Progress Notes (Signed)
Speech Language Pathology Daily Session Note  Patient Details  Name: Kelly HumphreysBennie H Landini MRN: [id number] Date of Birth: 1927/08/23  Today's Date: 02/06/2016 SLP Individual Time: 1100-1200 SLP Individual Time Calculation (min): 60 min   Short Term Goals: Week 1: SLP Short Term Goal 1 (Week 1): Pt will perform pharyngeal strengthening exercises with Min A verbal cues to strengthen swallow to demonstrate readiness for diet upgrade.  SLP Short Term Goal 2 (Week 1): Pt will consume trials of thin liquids without overt s/s of aspiration with Min A verbal cues for use of swallowing compensatory strategies.  SLP Short Term Goal 3 (Week 1): Pt will demonstrate efficient mastication and oral clearance of regular textures over 3 observed sessions to demonstrate readiness for diet advancment.  SLP Short Term Goal 4 (Week 1): Pt will utilize speech intelligibility strategies at the sentence level with Min A verbal cues to achieve 100% intelligibility.   Skilled Therapeutic Interventions: Skilled treatment session focused on dysphagia and speech goals. Pt appeared very fatigued at beginning of ST session as evidenced by her difficulty keeping her posture at midline. Pt stated that she was very tired. SLP facilitated session by providing trials of ice chips and thin water. Pt with cough this session possibly d/t increased fatigue and delayed swallow initiation/strength of swallow. Pt also given 3 boluses of regular with increased oral prep time and increased oral reside noted. Again, likely d/t fatigue. Pt doesn't appear to be a candidate for upgrade based on today's ability level. Pt required Mod A verbal cues for use of intelligibility strategies at 2 and 3 connected sentence level. Pt's speech characterized by imprecise articulation this session likely due to fatigue level. Education provided to pt on continued need for dysphagia 3 with nectar thick liquids. Pt voiced understanding and agreement. Pt left in room, up  in wheelchair with all needs within reach. Continue current plan of care.   Function:  Eating Eating   Modified Consistency Diet: No Eating Assist Level: Supervision or verbal cues;Helper checks for pocketed food;Set up assist for   Eating Set Up Assist For: Opening containers;Cutting food       Cognition Comprehension Comprehension assist level: Understands complex 90% of the time/cues 10% of the time  Expression   Expression assist level: Expresses complex 90% of the time/cues < 10% of the time  Social Interaction Social Interaction assist level: Interacts appropriately with others with medication or extra time (anti-anxiety, antidepressant).  Problem Solving Problem solving assist level: Solves complex problems: Recognizes & self-corrects  Memory Memory assist level: Recognizes or recalls 90% of the time/requires cueing < 10% of the time    Pain    Therapy/Group: Individual Therapy  Benjamyn Hestand 02/06/2016, 1:56 PM

## 2016-02-06 NOTE — Progress Notes (Signed)
[facility]     PROGRESS NOTE  Subjective/Complaints:  Pt laying in bed this AM.  She is happy to hear that she may be able to stay in rehab for some time to recuperate.    ROS: Denies CP, SOB, N/V/D  Objective: Vital Signs: Blood pressure 126/65, pulse 70, temperature 98.1 F (36.7 C), temperature source Oral, resp. rate 16, weight 49.4 kg (108 lb 14.4 oz), SpO2 98 %. No results found.  Recent Labs  02/04/16 0713  WBC 8.4  HGB 13.0  HCT 41.0  PLT 295    Recent Labs  02/04/16 0713  NA 141  K 4.0  CL 110  GLUCOSE 96  BUN 17  CREATININE 0.73  CALCIUM 9.0   CBG (last 3)   Recent Labs  02/05/16 1141  GLUCAP 164*    Wt Readings from Last 3 Encounters:  02/05/16 49.4 kg (108 lb 14.4 oz)  02/01/16 52.2 kg (115 lb)  02/20/13 56.7 kg (125 lb)    Physical Exam:  BP 126/65 (BP Location: Left Arm)   Pulse 70   Temp 98.1 F (36.7 C) (Oral)   Resp 16   Wt 49.4 kg (108 lb 14.4 oz)   SpO2 98%   BMI 21.27 kg/m  Constitutional: She appears well-developed. Frail HENT: Normocephalicand atraumatic.  Eyes: EOMand Conj are normal.  Cardiovascular: Normal rateand regular rhythm.  Respiratory: Effort normaland breath sounds normal. No respiratory distress.  GI: Soft. Bowel sounds are normal. She exhibits no distension.  Musculoskeletal: She exhibits no edemaor tenderness.  Neurological: She is alert and oriented.  Moderate dysarthria.  Follows simple commands.  Fair awareness of deficits Right facial droop +Resting tremor DTRs symmetric Motor: RUE: shoulder abduction, elbow flexion/extension 1+/5, wrist, hand 0/5 RLE: 4+/5 hip flexion, knee extension, 4+/5 ankle dorsi/plantar flexion LUE/LLE: 5/5 proximal to distal Psychiatric: She has a normal mood and affect. Her behavior is normal Skin: Skin is warmand dry   Assessment/Plan: 1. 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 Physiatrist is providing close team supervision and 24 hour management of active medical problems listed below. Physiatrist and rehab team continue to assess barriers to discharge/monitor patient progress toward functional and medical goals.  Function:  Bathing Bathing position   Position: Wheelchair/chair at sink  Bathing parts Body parts bathed by patient: Right arm, Chest, Abdomen, Right upper leg, Left upper leg, Right lower leg, Left lower leg Body parts bathed by helper: Left arm, Front perineal area, Buttocks, Back  Bathing assist Assist Level: Touching or steadying assistance(Pt > 75%)      Upper Body Dressing/Undressing Upper body dressing   What is the patient wearing?: Pull over shirt/dress     Pull over shirt/dress - Perfomed by patient: Put head through opening Pull over shirt/dress - Perfomed by helper: Thread/unthread right sleeve, Thread/unthread left sleeve, Pull shirt over trunk        Upper body assist Assist Level: Touching or steadying assistance(Pt > 75%)      Lower Body Dressing/Undressing Lower body dressing   What is the patient wearing?: Pants, Socks, Shoes       Pants- Performed by helper: Thread/unthread right pants leg, Thread/unthread left pants leg, Pull pants up/down   Non-skid slipper socks- Performed by helper: Don/doff right sock, Don/doff left sock Socks - Performed by patient: Don/doff left sock, Don/doff right  sock     Shoes - Performed by helper: Don/doff right shoe, Don/doff left shoe, Fasten right, Fasten left          Lower body assist Assist for lower body dressing: Touching or steadying assistance (Pt > 75%)      Toileting Toileting   Toileting steps completed by patient: Performs perineal hygiene (Urine only; assist following BM required) Toileting steps completed by helper: Adjust clothing prior to  toileting, Adjust clothing after toileting, Performs perineal hygiene Toileting Assistive Devices: Grab bar or rail  Toileting assist Assist level: Two helpers   Transfers Chair/bed transfer   Chair/bed transfer method: Squat pivot Chair/bed transfer assist level: Moderate assist (Pt 50 - 74%/lift or lower) Chair/bed transfer assistive device: Armrests     Locomotion Ambulation     Max distance: 30 ft Assist level: Moderate assist (Pt 50 - 74%)   Wheelchair   Type: Manual Max wheelchair distance: 115 ft Assist Level: Moderate assistance (Pt 50 - 74%)  Cognition Comprehension Comprehension assist level: Understands complex 90% of the time/cues 10% of the time  Expression Expression assist level: Expresses complex 90% of the time/cues < 10% of the time  Social Interaction Social Interaction assist level: Interacts appropriately with others with medication or extra time (anti-anxiety, antidepressant).  Problem Solving Problem solving assist level: Solves complex problems: Recognizes & self-corrects  Memory Memory assist level: Recognizes or recalls 90% of the time/requires cueing < 10% of the time     Medical Problem List and Plan: 1.  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  DVT Prophylaxis/Anticoagulation: Subcutaneous heparin. Monitor platelet counts and any signs of bleeding 3. Pain Management: Tylenol as needed 4. Dysphagia. Dysphagia #3 nectar liquids. Monitor hydration. Follow-up speech therapy 5. Neuropsych: This patient is capable of making decisions on her own behalf. 6. Skin/Wound Care: Routine skin checks 7. Fluids/Electrolytes/Nutrition: Routine I&O   BMP WNL on 9/12 8. Permissive hypertension: Controlled at present. 9. Essential tremor. Patient maintained on Topamax per family 10. Hyperlipidemia. Lipitor 11. Overactive  bladder. Ditropan 5 mg daily at bedtime. PVR 3 pending 12. Leukocytosis: Resolved  WBCs 8.4 on 9/12 13. Prediabetes:    Glucose WNL at present 14. Diastolic dysfunction: Monitor for fluid overload  LOS (Days) 3 A FACE TO FACE EVALUATION WAS PERFORMED  Jerremy Maione Karis Juba 02/06/2016 8:10 AM

## 2016-02-06 NOTE — Progress Notes (Signed)
Physical Therapy Session Note  Patient Details  Name: Kelly [name] MRN: [id number] Date of Birth: February 14, 1928  Today's Date: 02/06/2016 PT Individual Time: [id number] PT Individual Time Calculation (min): 56 min    Short Term Goals: Week 1:  PT Short Term Goal 1 (Week 1): Pt will ambulate 30 ft with LRAD & mod assist. PT Short Term Goal 2 (Week 1): Pt will perform bed<>w/c transfers with min assist. PT Short Term Goal 3 (Week 1): Pt will demonstrate standing balance with mod assist.  Skilled Therapeutic Interventions/Progress Updates:     Patient received sitting in WC and agreeable to PT.   PT instructed patient in stand pivot transfer with mod A to mat table. Sitting balance for lateral reach to the L with faciliation of R trunk x 10.    Gait training for 7010ft with Hemi walker and max A, Gait training for additional 5845ft with Hemiwalkier and Max A from PT. PT required to facilitate improved step width and increased knee control to prevent genu recurvatum.    Standing balance with 1 UE supported on hemiwalker and lateral weight shifts 5 minutes x 3 with mod A from PT. Max multimodal cues for improved weight bearing through RLE to allow shift to L.   WC mobility training x 16715ft with supervision A max cues for use of LUE and BLE propulsion technique. Max multimodal cues for improve awareness of RLE.      Therapy Documentation Precautions:  Precautions Precautions: Fall Precaution Comments: R hemiplegia, R hip bursitis Restrictions Weight Bearing Restrictions: No General:   Vital Signs: Therapy Vitals Temp: 98.8 F (37.1 C) Temp Source: Oral Pulse Rate: 93 Resp: 18 BP: 125/75 Patient Position (if appropriate): Sitting Oxygen Therapy SpO2: 96 % O2 Device: Not Delivered Pain: Pain Assessment Pain Assessment: No/denies pain  See Function Navigator for Current Functional Status.   Therapy/Group: Individual Therapy  Golden Popustin E Derold Dorsch 02/06/2016, 6:24 PM

## 2016-02-07 ENCOUNTER — Inpatient Hospital Stay (HOSPITAL_COMMUNITY): Payer: Medicare Other | Admitting: Physical Therapy

## 2016-02-07 ENCOUNTER — Inpatient Hospital Stay (HOSPITAL_COMMUNITY): Payer: Medicare Other | Admitting: Speech Pathology

## 2016-02-07 ENCOUNTER — Inpatient Hospital Stay (HOSPITAL_COMMUNITY): Payer: Medicare Other | Admitting: Occupational Therapy

## 2016-02-07 NOTE — Progress Notes (Signed)
Physical Therapy Session Note  Patient Details  Name: Kelly [name] MRN: [id number] Date of Birth: 25-Jul-1927  Today's Date: 02/07/2016 PT Individual Time: [id number] PT Individual Time Calculation (min): 86 min    Short Term Goals: Week 1:  PT Short Term Goal 1 (Week 1): Pt will ambulate 30 ft with LRAD & mod assist. PT Short Term Goal 2 (Week 1): Pt will perform bed<>w/c transfers with min assist. PT Short Term Goal 3 (Week 1): Pt will demonstrate standing balance with mod assist.  Skilled Therapeutic Interventions/Progress Updates:     Patient received sitting in WC.   WC mobility instructed by PT for 18450ft  in hall with supervision A with min cues for increased awareness of the RLE.Patient demonstrated use of R UE and BLE propulsion technique.   Stand pivot transfer with HW and max A from PT to R and L with mod cues for proper sequencing and improved trunk control. .   NMR; see below.  Standing balance with 2 inch step under LLE to increase WB through the RLE. PT instructed patient in  lateral weight shift L and R with one UE support on RW 3 x 3 minutes with mod cues for improved R knee control and increased push through R LE to shift weight to L.   Forced WB through RUE for lateral Lean to L and using RUE to return to mid line x 3 minutes with PT to stablize hand and allow increased use of triceps.  AAROM shoulder flexion/extension in gravity eliminated position x 10   PT instructed patient in sit<>supine transfer with min A for improved control of  R LE and increased awareness of the RUE.   Supine NMR for increases activition, coordination and control of BLE for SLR, heel slide, Bridges, clam shell, SAQ, end range hip extension with R LE off mat table. All NMR exercises performed x 12 BLE   PT instructed patient in Gait training in hall with HW x 25 ft and 6540ft with max A. PT required to faciliate weight shift to L to allow step through on R LE, improve trunk control, increased  step width and prevention of genurecurvatum. On second bout PT applied Ace wrap for increased DF on the RLE; Improved weight shift on second bout to allow increased clearance on the RLE with swing through as well as noted improvement in knee control in stance on the RLE. Marland Kitchen.   [facility]    Therapy Documentation Precautions:  Precautions Precautions: Fall Precaution Comments: R hemiplegia, R hip bursitis Restrictions Weight Bearing Restrictions: No    Pain: Pain Assessment Pain Assessment: No/denies pain    Other Treatments: Treatments Neuromuscular Facilitation: Right;Upper Extremity;Lower Extremity;Forced use;Activity to increase coordination;Activity to increase motor control;Activity to increase timing and sequencing;Activity to increase grading;Activity to increase sustained activation;Activity to increase lateral weight shifting Weight Bearing Technique Weight Bearing Technique: Yes RUE Weight Bearing Technique: Forearm seated;Extended arm seated   See Function Navigator for Current Functional Status.   Therapy/Group: Individual Therapy  Golden Popustin E Andraya Frigon 02/07/2016, 6:35 PM

## 2016-02-07 NOTE — Progress Notes (Signed)
[facility] [place] PHYSICAL MEDICINE & REHABILITATION     PROGRESS NOTE  Subjective/Complaints:  Pt sitting up in bed this AM.  She asks, "when will my hand work".  ROS: Denies CP, SOB, N/V/D  Objective: Vital Signs: Blood pressure (!) 120/55, pulse 69, temperature 97.9 F (36.6 C), temperature source Oral, resp. rate 16, weight 49.4 kg (108 lb 14.4 oz), SpO2 98 %. No results found. No results for input(s): WBC, HGB, HCT, PLT in the last 72 hours. No results for input(s): NA, K, CL, GLUCOSE, BUN, CREATININE, CALCIUM in the last 72 hours.  Invalid input(s): CO CBG (last 3)   Recent Labs  02/05/16 1141  GLUCAP 164*    Wt Readings from Last 3 Encounters:  02/05/16 49.4 kg (108 lb 14.4 oz)  02/01/16 52.2 kg (115 lb)  02/20/13 56.7 kg (125 lb)    Physical Exam:  BP (!) 120/55 (BP Location: Left Arm)   Pulse 69   Temp 97.9 F (36.6 C) (Oral)   Resp 16   Wt 49.4 kg (108 lb 14.4 oz)   SpO2 98%   BMI 21.27 kg/m  Constitutional: She appears well-developed. Frail HENT: Normocephalicand atraumatic.  Eyes: EOMand Conj are normal.  Cardiovascular: Normal rateand regular rhythm.  Respiratory: Effort normaland breath sounds normal. No respiratory distress.  GI: Soft. Bowel sounds are normal. She exhibits no distension.  Musculoskeletal: She exhibits no edemaor tenderness.  Neurological: She is alert and oriented.  Moderate dysarthria.  Follows simple commands.  Fair awareness of deficits Right facial droop +Resting tremor DTRs symmetric Motor: RUE: shoulder abduction, elbow flexion/extension 1+/5, wrist, hand 0/5 RLE: 4+/5 hip flexion, knee extension, 4+/5 ankle dorsi/plantar flexion LUE/LLE: 5/5 proximal to distal Psychiatric: She has a normal mood and affect. Her behavior is normal Skin: Skin is warmand dry   Assessment/Plan: 1. 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 Physiatrist is providing close team supervision and 24 hour management of active medical problems listed below. Physiatrist and rehab team continue to assess barriers to discharge/monitor patient progress toward functional and medical goals.  Function:  Bathing Bathing position   Position: Wheelchair/chair at sink  Bathing parts Body parts bathed by patient: Right arm, Chest, Abdomen, Right upper leg, Left upper leg, Right lower leg, Left lower leg Body parts bathed by helper: Left arm, Front perineal area, Buttocks, Back  Bathing assist Assist Level: Touching or steadying assistance(Pt > 75%)      Upper Body Dressing/Undressing Upper body dressing   What is the patient wearing?: Pull over shirt/dress     Pull over shirt/dress - Perfomed by patient: Put head through opening Pull over shirt/dress - Perfomed by helper: Thread/unthread right sleeve, Thread/unthread left sleeve, Pull shirt over trunk        Upper body assist Assist Level: Touching or steadying assistance(Pt > 75%)      Lower Body Dressing/Undressing Lower body dressing   What is the patient wearing?: Pants, Socks, Shoes       Pants- Performed by helper: Thread/unthread right pants leg, Thread/unthread left pants leg, Pull pants up/down   Non-skid slipper socks- Performed by helper: Don/doff right sock, Don/doff left sock Socks - Performed by patient: Don/doff left sock, Don/doff right sock     Shoes - Performed by helper: Don/doff right shoe, Don/doff left shoe, Fasten right, Fasten left  Lower body assist Assist for lower body dressing: Touching or steadying assistance (Pt > 75%)      Toileting Toileting   Toileting steps completed by patient: Performs perineal hygiene Toileting steps completed by helper: Adjust clothing prior to toileting, Adjust clothing after toileting Toileting Assistive Devices: Grab bar or rail   Toileting assist Assist level: Two helpers   Transfers Chair/bed transfer   Chair/bed transfer method: Squat pivot Chair/bed transfer assist level: Moderate assist (Pt 50 - 74%/lift or lower) Chair/bed transfer assistive device: Armrests     Locomotion Ambulation     Max distance: 6045ft Assist level: Maximal assist (Pt 25 - 49%)   Wheelchair   Type: Manual Max wheelchair distance: 115 Assist Level: Supervision or verbal cues  Cognition Comprehension Comprehension assist level: Understands complex 90% of the time/cues 10% of the time  Expression Expression assist level: Expresses complex 90% of the time/cues < 10% of the time  Social Interaction Social Interaction assist level: Interacts appropriately with others with medication or extra time (anti-anxiety, antidepressant).  Problem Solving Problem solving assist level: Solves complex problems: Recognizes & self-corrects  Memory Memory assist level: Recognizes or recalls 90% of the time/requires cueing < 10% of the time     Medical Problem List and Plan: 1.  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  DVT Prophylaxis/Anticoagulation: Subcutaneous heparin. Monitor platelet counts and any signs of bleeding 3. Pain Management: Tylenol as needed 4. Dysphagia. Dysphagia #3 nectar liquids. Monitor hydration. Follow-up speech therapy 5. Neuropsych: This patient is capable of making decisions on her own behalf.  Come depressive symptoms - appreciate Neuropsych 6. Skin/Wound Care: Routine skin checks 7. Fluids/Electrolytes/Nutrition: Routine I&O   BMP WNL on 9/12 8. Permissive hypertension: Controlled at present. 9. Essential tremor. Patient maintained on Topamax per family 10. Hyperlipidemia. Lipitor 11. Overactive bladder. Ditropan 5 mg daily at bedtime. PVR 3, one recording of 250cc, will  cont to monitor 12. Leukocytosis: Resolved  WBCs 8.4 on 9/12 13. Prediabetes:    CBGs relatively controlled at present 14. Diastolic dysfunction: Monitor for fluid overload  LOS (Days) 4 A FACE TO FACE EVALUATION WAS PERFORMED  Sentoria Brent Karis Jubanil Tasheem Elms 02/07/2016 8:49 AM

## 2016-02-07 NOTE — Progress Notes (Signed)
Occupational Therapy Session Note  Patient Details  Name: Kelly [name] MRN: [id number] Date of Birth: August 21, 1927  Today's Date: 02/07/2016 OT Individual Time: 0900-1000 OT Individual Time Calculation (min): 60 min     Short Term Goals: Week 1:  OT Short Term Goal 1 (Week 1): Pt will recall hemi-dressing techniques 2 consecutive days with min questioning cues OT Short Term Goal 2 (Week 1): Pt will complete stand pivot toilet transfer with mod A in order to decrease caregiver burden OT Short Term Goal 3 (Week 1): Pt will dress UB with min A in order to reduce caregiver burden OT Short Term Goal 4 (Week 1): Pt will maintain static standing balance for 1 minutes with min A at RW/ sink in prep for functional task  Skilled Therapeutic Interventions/Progress Updates:   1:1 OT session focused on bathing/dressing modifications, functional transfers, standing endurance, and NMRe-ed. Pt required Mod A for UB dressing and Max A for LB dressing. Pt able to assist with threading pants onto L leg, but still required helper assist. Min A sit<>stand, unable to remove L UE to assist with pulling pants over hips. Pt then brought to therapy gym where she completed therapeutic activities incorporating R UE weight bearing activities. Pt with increased activation noted at R shoulder, w/ increased activity in gravity eliminated position. Pt returned to room at end of session and left in w/c with needs met.   Therapy Documentation Precautions:  Precautions Precautions: Fall Precaution Comments: R hemiplegia, R hip bursitis Restrictions Weight Bearing Restrictions: No Pain: Pain Assessment Pain Assessment: No/denies pain   Other Treatments: Treatments Neuromuscular Facilitation: Right;Upper Extremity;Lower Extremity;Forced use;Activity to increase coordination;Activity to increase motor control;Activity to increase timing and sequencing;Activity to increase grading;Activity to increase sustained  activation;Activity to increase lateral weight shifting Weight Bearing Technique Weight Bearing Technique: Yes RUE Weight Bearing Technique: Forearm seated;Extended arm seated Response to Weight Bearing Technique: improved activation noted in R mm surrounding GH joint. and Tricep.   See Function Navigator for Current Functional Status.   Therapy/Group: Individual Therapy  [name] 02/07/2016, 4:39 PM

## 2016-02-07 NOTE — Progress Notes (Signed)
Speech Language Pathology Daily Session Note  Patient Details  Name: Kelly [name] MRN: [id number] Date of Birth: [birth year]  Today's Date: 02/07/2016 SLP Individual Time: 1002-1101 SLP Individual Time Calculation (min): 59 min   Short Term Goals: Week 1: SLP Short Term Goal 1 (Week 1): Pt will perform pharyngeal strengthening exercises with Min A verbal cues to strengthen swallow to demonstrate readiness for diet upgrade.  SLP Short Term Goal 2 (Week 1): Pt will consume trials of thin liquids without overt s/s of aspiration with Min A verbal cues for use of swallowing compensatory strategies.  SLP Short Term Goal 3 (Week 1): Pt will demonstrate efficient mastication and oral clearance of regular textures over 3 observed sessions to demonstrate readiness for diet advancment.  SLP Short Term Goal 4 (Week 1): Pt will utilize speech intelligibility strategies at the sentence level with Min A verbal cues to achieve 100% intelligibility.   Skilled Therapeutic Interventions: Pt was seen for skilled ST targeting goals for communication and dysphagia.  SLP facilitated the session with trials of thin liquids via cup sips to continue working towards liquids progression.  Pt consumed ~6 oz of thin liquids with soft, delayed throat clear on <50% of trials.  Recommend continuing with trials of thin liquids with SLP only for now but prognosis for advancement is good given that pt is cognitively intact and demonstrates good safety awareness with PO intake.  SLP facilitated the session with a picture description task with incorporation of visual barrier to further challenge intelligibility.  Pt required min assist verbal cues for increased vocal intensity and repetition to clarify at the sentence level during the abovementioned task. Pt was returned to room and left in wheelchair with call bell within reach and quick release belt donned for safety.  Continue per current plan of care.    Function:  Eating Eating    Modified Consistency Diet: Yes Eating Assist Level: Supervision or verbal cues           Cognition Comprehension Comprehension assist level: Understands complex 90% of the time/cues 10% of the time  Expression   Expression assist level: Expresses basic 75 - 89% of the time/requires cueing 10 - 24% of the time. Needs helper to occlude trach/needs to repeat words.  Social Interaction Social Interaction assist level: Interacts appropriately with others with medication or extra time (anti-anxiety, antidepressant).  Problem Solving Problem solving assist level: Solves basic problems with no assist  Memory Memory assist level: Recognizes or recalls 90% of the time/requires cueing < 10% of the time    Pain Pain Assessment Pain Assessment: No/denies pain  Therapy/Group: Individual Therapy  Yarissa Reining, Melanee SpryNicole L 02/07/2016, 2:58 PM

## 2016-02-08 ENCOUNTER — Inpatient Hospital Stay (HOSPITAL_COMMUNITY): Payer: Medicare Other | Admitting: Speech Pathology

## 2016-02-08 ENCOUNTER — Inpatient Hospital Stay (HOSPITAL_COMMUNITY): Payer: Medicare Other | Admitting: Physical Therapy

## 2016-02-08 ENCOUNTER — Inpatient Hospital Stay (HOSPITAL_COMMUNITY): Payer: Medicare Other

## 2016-02-08 DIAGNOSIS — G8111 Spastic hemiplegia affecting right dominant side: Secondary | ICD-10-CM

## 2016-02-08 NOTE — Progress Notes (Signed)
Kelly [name] PHYSICAL MEDICINE & REHABILITATION     PROGRESS NOTE  Subjective/Complaints:  Patient without new complaints today. Husband is in the room with her. No movement in the right upper extremity. Requesting to go to the bathroom.  ROS: Denies CP, SOB, N/V/D  Objective: Vital Signs: Blood pressure 99/76, pulse 78, temperature 98.4 F (36.9 C), temperature source Oral, resp. rate 16, weight 49.4 kg (108 lb 14.4 oz), SpO2 92 %. No results found. No results for input(s): WBC, HGB, HCT, PLT in the last 72 hours. No results for input(s): NA, K, CL, GLUCOSE, BUN, CREATININE, CALCIUM in the last 72 hours.  Invalid input(s): CO CBG (last 3)   Recent Labs  02/05/16 1141  GLUCAP 164*    Wt Readings from Last 3 Encounters:  02/05/16 49.4 kg (108 lb 14.4 oz)  02/01/16 52.2 kg (115 lb)  02/20/13 56.7 kg (125 lb)    Physical Exam:  BP 99/76 (BP Location: Left Arm)   Pulse 78   Temp 98.4 F (36.9 C) (Oral)   Resp 16   Wt 49.4 kg (108 lb 14.4 oz)   SpO2 92%   BMI 21.27 kg/m  Constitutional: She appears well-developed. Frail HENT: Normocephalicand atraumatic.  Eyes: EOMand Conj are normal.  Cardiovascular: Normal rateand regular rhythm.  Respiratory: Effort normaland breath sounds normal. No respiratory distress.  GI: Soft. Bowel sounds are normal. She exhibits no distension.  Musculoskeletal: She exhibits no edemaor tenderness.  Neurological: She is alert and oriented.  Moderate dysarthria.  Follows simple commands.  Fair awareness of deficits Right facial droop +Resting tremor DTRs symmetric Motor: RUE: shoulder abduction, elbow flexion/extension 1+/5, wrist, hand 0/5 RLE: 4+/5 hip flexion, knee extension, 4+/5 ankle dorsi/plantar flexion LUE/LLE: 5/5 proximal to distal Psychiatric: She has a normal mood and affect. Her behavior is normal Skin: Skin is warmand dry   Assessment/Plan: 1. Functional deficits secondary to linear infarct within the posterior  limb of the left internal capsule which require 3+ hours per day of interdisciplinary therapy in a comprehensive inpatient rehab setting. Physiatrist is providing close team supervision and 24 hour management of active medical problems listed below. Physiatrist and rehab team continue to assess barriers to discharge/monitor patient progress toward functional and medical goals.  Function:  Bathing Bathing position   Position: Wheelchair/chair at sink  Bathing parts Body parts bathed by patient: Right arm, Chest, Abdomen, Right upper leg, Left upper leg, Right lower leg, Left lower leg Body parts bathed by helper: Left arm, Front perineal area, Buttocks, Back  Bathing assist Assist Level: Touching or steadying assistance(Pt > 75%)      Upper Body Dressing/Undressing Upper body dressing   What is the patient wearing?: Pull over shirt/dress, Bra Bra - Perfomed by patient: Thread/unthread left bra strap Bra - Perfomed by helper: Hook/unhook bra (pull down sports bra), Thread/unthread right bra strap Pull over shirt/dress - Perfomed by patient: Put head through opening Pull over shirt/dress - Perfomed by helper: Thread/unthread right sleeve, Thread/unthread left sleeve        Upper body assist Assist Level: Touching or steadying assistance(Pt > 75%)      Lower Body Dressing/Undressing Lower body dressing   What is the patient wearing?: Socks, Shoes, Pants       Pants- Performed by helper: Thread/unthread right pants leg, Thread/unthread left pants leg, Pull pants up/down   Non-skid slipper socks- Performed by helper: Don/doff right sock, Don/doff left sock   Socks - Performed by helper: Don/doff right sock, Don/doff left  sock   Shoes - Performed by helper: Don/doff right shoe, Don/doff left shoe, Fasten right, Fasten left          Lower body assist Assist for lower body dressing: Touching or steadying assistance (Pt > 75%)      Toileting Toileting   Toileting steps  completed by patient: Performs perineal hygiene Toileting steps completed by helper: Adjust clothing prior to toileting, Adjust clothing after toileting Toileting Assistive Devices: Grab bar or rail  Toileting assist Assist level: Two helpers   Transfers Chair/bed transfer   Chair/bed transfer method: Stand pivot Chair/bed transfer assist level: Maximal assist (Pt 25 - 49%/lift and lower) Chair/bed transfer assistive device: Patent attorney     Max distance: 72ft Assist level: Maximal assist (Pt 25 - 49%)   Wheelchair   Type: Manual Max wheelchair distance: 122ft Assist Level: Supervision or verbal cues  Cognition Comprehension Comprehension assist level: Follows basic conversation/direction with no assist  Expression Expression assist level: Expresses basic 90% of the time/requires cueing < 10% of the time.  Social Interaction Social Interaction assist level: Interacts appropriately with others with medication or extra time (anti-anxiety, antidepressant).  Problem Solving Problem solving assist level: Solves basic problems with no assist  Memory Memory assist level: Recognizes or recalls 90% of the time/requires cueing < 10% of the time     Medical Problem List and Plan: 1.  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  DVT Prophylaxis/Anticoagulation: Subcutaneous heparin. Monitor platelet counts and any signs of bleeding 3. Pain Management: Tylenol as needed 4. Dysphagia. Dysphagia #3 nectar liquids. Monitor hydration. Follow-up speech therapy 5. Neuropsych: This patient is capable of making decisions on her own behalf.  Come depressive symptoms - appreciate Neuropsych 6. Skin/Wound Care: Routine skin checks 7. Fluids/Electrolytes/Nutrition: Routine I&O   BMP WNL on 9/12 8. Permissive hypertension:Blood pressure  actually on the low side today. No symptoms of hypotension. However, currently not on any antihypertensive medications, check orthostatics. Every morning Vitals:   02/07/16 1255 02/08/16 0533  BP: 125/70 99/76  Pulse: 80 78  Resp: 18 16  Temp: 98.1 F (36.7 C) 98.4 F (36.9 C)   9. Essential tremor. Patient maintained on Topamax per family 10. Hyperlipidemia. Lipitor 11. Overactive bladder. Ditropan 5 mg daily at bedtime. PVR 3, one recording of 250cc, will cont to monitor 12. Leukocytosis: Resolved  WBCs 8.4 on 9/12 13. Prediabetes:    CBGs relatively controlled at present 14. Diastolic dysfunction: Monitor for fluid overload  LOS (Days) 5 A FACE TO FACE EVALUATION WAS PERFORMED  KIRSTEINS,ANDREW E 02/08/2016 11:12 AM

## 2016-02-08 NOTE — Progress Notes (Signed)
Speech Language Pathology Daily Session Note  Patient Details  Name: Kelly [name] MRN: [id number] Date of Birth: 1928/02/25  Today's Date: 02/08/2016 SLP Individual Time: [id number] SLP Individual Time Calculation (min): 45 min   Short Term Goals: Week 1: SLP Short Term Goal 1 (Week 1): Pt will perform pharyngeal strengthening exercises with Min A verbal cues to strengthen swallow to demonstrate readiness for diet upgrade.  SLP Short Term Goal 2 (Week 1): Pt will consume trials of thin liquids without overt s/s of aspiration with Min A verbal cues for use of swallowing compensatory strategies.  SLP Short Term Goal 3 (Week 1): Pt will demonstrate efficient mastication and oral clearance of regular textures over 3 observed sessions to demonstrate readiness for diet advancment.  SLP Short Term Goal 4 (Week 1): Pt will utilize speech intelligibility strategies at the sentence level with Min A verbal cues to achieve 100% intelligibility.   Skilled Therapeutic Interventions:Pt seen for skilled SLP targeting speech and swallowing. Pt able to recall 2 of 3 strategies for improved intelligibility, but required mod verbal cueing to utilize strategies. Pt tolerated trials of ice chips and water via teaspoon and cup sip without overt s/s aspiration. Improved performance noted when pt self-feeding and responded well to directive of "take the smallest sip possible." Consider water protocol if performance continues to improve.    Function:  Eating Eating   Modified Consistency Diet: Yes Eating Assist Level: Supervision or verbal cues   Eating Set Up Assist For: Opening containers;Cutting food       Cognition Comprehension Comprehension assist level: Follows basic conversation/direction with no assist  Expression   Expression assist level: Expresses basic 90% of the time/requires cueing < 10% of the time.  Social Interaction Social Interaction assist level: Interacts appropriately with others with  medication or extra time (anti-anxiety, antidepressant).  Problem Solving Problem solving assist level: Solves basic problems with no assist  Memory Memory assist level: Recognizes or recalls 90% of the time/requires cueing < 10% of the time    Pain Pain Assessment Pain Assessment: No/denies pain  Therapy/Group: Individual Therapy  Rocky CraftsKara E Ambrea Hegler MA, CCC-SLP 02/08/2016, 11:03 AM

## 2016-02-08 NOTE — Progress Notes (Signed)
Physical Therapy Session Note  Patient Details  Name: Kelly HumphreysBennie H Shiraishi MRN: [id number] Date of Birth: 01/05/1928  Today's Date: 02/08/2016 PT Individual Time: 1410-1500 PT Individual time Calculation      Short Term Goals: Week 1:  PT Short Term Goal 1 (Week 1): Pt will ambulate 30 ft with LRAD & mod assist. PT Short Term Goal 2 (Week 1): Pt will perform bed<>w/c transfers with min assist. PT Short Term Goal 3 (Week 1): Pt will demonstrate standing balance with mod assist.  Skilled Therapeutic Interventions/Progress Updates:     Patient received supine in bed and agreeable to PT.   PT instructed patient in supine to sit with min A through roll to R and improved use of the R UE to push though elbow to come to sitting position and the maintain balance with WB through R UE on bed with positioning by PT.   [facility] [id number] with supervision with use of BLE and LUE for propulsion.  PT noted patient to have improved awareness of the R LE and was able to prevent it from being pulling under WC throughout treatment session.   Stair trainining instructed by PT with 1 UE support on the L x 8 steps (3") with mod A for ascent and max A for descent. Max cues for proper step to gait pattern and PT required to stabilize knee to prevent genu recuvatum.   Gait training with HW and no AFO with max A. Gait with HW, AFO and max A, but noted improvement in weight shift and anterior tibal rotation over ankle. PT was required to assist with placement of the RLE due to abduciton gait and poor trunk control. With max cues, paitent able to demonstrate appropriate weight shifting through the LLE to allow advancement of the RLE.    Standing tolerance with weight shifts to reach for a target. 2 inch step placed under R foot for 5 minutes and under L foot x 5 minutes. Patient continues to demonstrate slight pushing tendencies to R with LLE in stance but able to overcome with max cues and  improved trunk elongation to increase weight shift to L.   Patient returned to room and left [facility] with call bell in reach     Therapy Documentation Precautions:  Precautions Precautions: Fall Precaution Comments: R hemiplegia, R hip bursitis Restrictions Weight Bearing Restrictions: No General:   Vital Signs: Therapy Vitals Temp: 98.4 F (36.9 C) Temp Source: Oral Pulse Rate: 78 Resp: 16 BP: 99/76 Patient Position (if appropriate): Lying Oxygen Therapy SpO2: 92 % O2 Device: Not Delivered  See Function Navigator for Current Functional Status.   Therapy/Group: Individual Therapy  Golden Popustin E Arlen Dupuis 02/08/2016, 7:47 AM

## 2016-02-08 NOTE — Progress Notes (Signed)
Occupational Therapy Session Note  Patient Details  Name: Kelly [name] MRN: [id number] Date of Birth: [birth year]  Today's Date: 02/08/2016 OT Individual Time: 0900-1000 OT Individual Time Calculation (min): 60 min    Short Term Goals: Week 1:  OT Short Term Goal 1 (Week 1): Pt will recall hemi-dressing techniques 2 consecutive days with min questioning cues OT Short Term Goal 2 (Week 1): Pt will complete stand pivot toilet transfer with mod A in order to decrease caregiver burden OT Short Term Goal 3 (Week 1): Pt will dress UB with min A in order to reduce caregiver burden OT Short Term Goal 4 (Week 1): Pt will maintain static standing balance for 1 minutes with min A at RW/ sink in prep for functional task  Skilled Therapeutic Interventions/Progress Updates:   ADL-retraining at shower level with focus on transfers, dynamic sitting balance, sit<>stand, and incorporation of RUE as assist during performance of BADL.   Pt received supine in bed, tearful d/t decline in function, but motivated with offer of shower and assist.   Pt required vc and manual facilitation for bed mobility and moderate assist to complete SPT transfer to w/c.  Similar transfer completed to toilet and to tub bench.   Pt demo'd good thoroughness with bathing with assist to manage RUE and steady while standing for assist to wash buttocks.   OT educated pt on dressing at sink with reinforcement recommended to don clothing on right UE and LE before left.   Pt groomed with setup and min assist to dry her hair using hair dryer from rehab office.  Therapy Documentation Precautions:  Precautions Precautions: Fall Precaution Comments: R hemiplegia, R hip bursitis Restrictions Weight Bearing Restrictions: No  Vital Signs: Therapy Vitals Temp: 98.5 F (36.9 C) Pulse Rate: 66 Resp: 18 BP: 108/61 Patient Position (if appropriate): Lying Oxygen Therapy SpO2: 97 % O2 Device: Not Delivered   Pain: Pain Assessment Pain  Assessment: No/denies pain  See Function Navigator for Current Functional Status.   Therapy/Group: Individual Therapy   Second session: Time:1500-1530 Time Calculation (min): 30  min  Pain Assessment: No/denies pain  Skilled Therapeutic Interventions:   Therapeutic activity with focus on NMR of RUE using table top activities to include active shoulder horizontal abduction/adduction, shoulder flexion/extension, elbow flexion/extension using arm skate.   Pt demo'd minimal elbow extension when right arm was isolated but improved when encouraged to mirror and assist with use of left arm on table top during AROM movement pattern practice.  See FIM for current functional status  Therapy/Group: Individual Therapy  Mccartney Brucks 02/09/2016, 7:11 AM

## 2016-02-09 NOTE — Progress Notes (Signed)
PHYSICAL MEDICINE & REHABILITATION     PROGRESS NOTE  Subjective/Complaints:  Still having weakness, right upper extremity greater than right lower extremity  ROS: Denies CP, SOB, N/V/D  Objective: Vital Signs: Blood pressure 108/61, pulse 66, temperature 98.5 F (36.9 C), resp. rate 18, weight 49.4 kg (108 lb 14.4 oz), SpO2 97 %. No results found. No results for input(s): WBC, HGB, HCT, PLT in the last 72 hours. No results for input(s): NA, K, CL, GLUCOSE, BUN, CREATININE, CALCIUM in the last 72 hours.  Invalid input(s): CO CBG (last 3)  No results for input(s): GLUCAP in the last 72 hours.  Wt Readings from Last 3 Encounters:  02/05/16 49.4 kg (108 lb 14.4 oz)  02/01/16 52.2 kg (115 lb)  02/20/13 56.7 kg (125 lb)    Physical Exam:  BP 108/61 (BP Location: Left Arm)   Pulse 66   Temp 98.5 F (36.9 C)   Resp 18   Wt 49.4 kg (108 lb 14.4 oz)   SpO2 97%   BMI 21.27 kg/m  Constitutional: She appears well-developed. Frail HENT: Normocephalicand atraumatic.  Eyes: EOMand Conj are normal.  Cardiovascular: Normal rateand regular rhythm.  Respiratory: Effort normaland breath sounds normal. No respiratory distress.  GI: Soft. Bowel sounds are normal. She exhibits no distension.  Musculoskeletal: She exhibits no edemaor tenderness.  Neurological: She is alert and oriented.  Moderate dysarthria.  Follows simple commands.  Fair awareness of deficits Right facial droop +Resting tremor DTRs symmetric Motor: RUE: shoulder abduction, elbow flexion/extension 1+/5, wrist, hand 0/5 RLE: 4+/5 hip flexion, knee extension, 4+/5 ankle dorsi/plantar flexion LUE/LLE: 5/5 proximal to distal Psychiatric: She has a normal mood and affect. Her behavior is normal Skin: Skin is warmand dry   Assessment/Plan: 1. 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 Physiatrist is providing close team supervision and 24 hour management of active medical problems listed below. Physiatrist and rehab team continue to assess barriers to discharge/monitor patient progress toward functional and medical goals.  Function:  Bathing Bathing position   Position: Shower  Bathing parts Body parts bathed by patient: Right arm, Chest, Abdomen, Front perineal area, Right upper leg, Left upper leg Body parts bathed by helper: Left arm, Buttocks, Back, Right lower leg, Left lower leg  Bathing assist Assist Level:  (Mod A)      Upper Body Dressing/Undressing Upper body dressing   What is the patient wearing?: Bra, Pull over shirt/dress Bra - Perfomed by patient: Thread/unthread left bra strap Bra - Perfomed by helper: Thread/unthread right bra strap, Hook/unhook bra (pull down sports bra) Pull over shirt/dress - Perfomed by patient: Thread/unthread left sleeve, Put head through opening Pull over shirt/dress - Perfomed by helper: Thread/unthread right sleeve, Pull shirt over trunk        Upper body assist Assist Level:  (Mod A)      Lower Body Dressing/Undressing Lower body dressing   What is the patient wearing?: American Family Insuranceed Hose, Shoes, Underwear, Magazine features editorants Underwear - Performed by patient: Thread/unthread left underwear leg Underwear - Performed by helper: Pull underwear up/down, Thread/unthread right underwear leg Pants- Performed by patient: Thread/unthread left pants leg Pants- Performed by helper: Thread/unthread right pants leg, Pull pants up/down   Non-skid slipper socks- Performed by helper: Don/doff right sock, Don/doff left sock   Socks - Performed by helper: Don/doff right sock, Don/doff left sock  Shoes - Performed by helper: Don/doff right shoe, Don/doff left shoe, Fasten right, Fasten left       TED Hose - Performed by helper: Don/doff right TED hose, Don/doff left TED hose  Lower body assist  Assist for lower body dressing:  (Max A)      Toileting Toileting   Toileting steps completed by patient: Performs perineal hygiene Toileting steps completed by helper: Adjust clothing prior to toileting, Adjust clothing after toileting Toileting Assistive Devices: Grab bar or rail  Toileting assist Assist level: Two helpers   Transfers Chair/bed transfer   Chair/bed transfer method: Stand pivot Chair/bed transfer assist level: Maximal assist (Pt 25 - 49%/lift and lower) Chair/bed transfer assistive device: Armrests, Patent attorney     Max distance: 84ft  Assist level: Maximal assist (Pt 25 - 49%)   Wheelchair   Type: Manual Max wheelchair distance: 167ft  Assist Level: Supervision or verbal cues  Cognition Comprehension Comprehension assist level: Understands complex 90% of the time/cues 10% of the time  Expression Expression assist level: Expresses complex 90% of the time/cues < 10% of the time  Social Interaction Social Interaction assist level: Interacts appropriately with others with medication or extra time (anti-anxiety, antidepressant).  Problem Solving Problem solving assist level: Solves complex problems: Recognizes & self-corrects  Memory Memory assist level: Recognizes or recalls 90% of the time/requires cueing < 10% of the time     Medical Problem List and Plan: 1.  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  DVT Prophylaxis/Anticoagulation: Subcutaneous heparin. Monitor platelet counts and any signs of bleeding 3. Pain Management: Tylenol as needed 4. Dysphagia. Dysphagia #3 nectar liquids. Monitor hydration. Follow-up speech therapy 5. Neuropsych: This patient is capable of making decisions on her own behalf.  Come depressive symptoms - appreciate Neuropsych 6. Skin/Wound Care: Routine skin  checks 7. Fluids/Electrolytes/Nutrition: Routine I&O , poor by mouth intake 10-50%., Dietary consult  BMP WNL on 9/12 8. Permissive hypertension:Blood pressure actually on the low side today. No symptoms of hypotension. However, currently not on any antihypertensive medications, check orthostatics. Every morning Vitals:   02/08/16 1900 02/09/16 0500  BP: 127/64 108/61  Pulse: 73 66  Resp: 18 18  Temp: 98.5 F (36.9 C) 98.5 F (36.9 C)   9. Essential tremor. Patient maintained on Topamax per family 10. Hyperlipidemia. Lipitor 11. Overactive bladder. Ditropan 5 mg daily at bedtime. PVR 3, one recording of 250cc, will cont to monitor 12. Leukocytosis: Resolved  WBCs 8.4 on 9/12 13. Prediabetes:    CBGs relatively controlled at present 14. Diastolic dysfunction: Monitor for fluid overload  LOS (Days) 6 A FACE TO FACE EVALUATION WAS PERFORMED  Erick Colace 02/09/2016 10:38 AM

## 2016-02-10 ENCOUNTER — Inpatient Hospital Stay (HOSPITAL_COMMUNITY): Payer: Medicare Other | Admitting: Physical Therapy

## 2016-02-10 ENCOUNTER — Inpatient Hospital Stay (HOSPITAL_COMMUNITY): Payer: Medicare Other | Admitting: Speech Pathology

## 2016-02-10 ENCOUNTER — Inpatient Hospital Stay (HOSPITAL_COMMUNITY): Payer: Medicare Other | Admitting: Occupational Therapy

## 2016-02-10 NOTE — Progress Notes (Signed)
Initial Nutrition Assessment  DOCUMENTATION CODES:   Not applicable  INTERVENTION:  Magic cup TID with meals, each supplement provides 290 kcal and 9 grams of protein.  Encouraged patient to eat snacks between meals as needed to supplement intake.  NUTRITION DIAGNOSIS:   Swallowing difficulty related to dysphagia as evidenced by per patient/family report (SLP evaluation).  GOAL:   Patient will meet greater than or equal to 90% of their needs  MONITOR:   PO intake, Supplement acceptance, Weight trends, I & O's  REASON FOR ASSESSMENT:   Consult Poor PO  ASSESSMENT:   82 y.o.female with PMH of osteoporosis, essential tremor, who presented 02/01/2016 with right side weakness as well as slurred speech. MRI of the brain showed acute nonhemorrhagic 1.4 cm linear infarct within the posterior limb of the left internal capsule. CTA of head and neck negative. Patient did not receive TPA.  Dysphagia #3 nectar thick liquid diet. Patient was admitted for a comprehensive rehabilitation program   Pt reports her appetite is good now and also was PTA. She did not have any trouble swallowing PTA. Typical intake at home was 3 meals/day. UBW 116 lbs. She did not report any known weight loss. Per chart, pt lost 8.5 kg (15% body weight) in 3 days. This is highly unlikely - weight of 115 lbs likely in error. Unsure if patient has had some weight loss, or was just incorrect in reporting UBW. Patient reports she is eating the same amount she usually does at home. Reports that there is a lot of food coming on each tray - for breakfast she received 3 bowls of oatmeal, 2 bowls of fruit, 2 orange juice, and a yogurt. She always eats the protein with each meal. Patient amenable to Magic Cup TID with meals.   Meal Completion: 10-50% per chart  Medications reviewed and include: pantoprazole.  Labs reviewed: CBG 164 on 9/13.   Nutrition-Focused physical exam completed. Findings are moderate fat depletion,  moderate muscle depletion, and no edema. She has left-sided weakness. Patient denies changes in how clothes fit. She reports she has always been "tiny." However, if had clearer picture on if she had true weight loss, would be able to diagnose malnutrition.  Discussed plan with RN. She believes that pt is eating well - it just does not look like it since they are sending her so much food on the tray.   Diet Order:  DIET DYS 3 Room service appropriate? Yes; Fluid consistency: Nectar Thick  Skin:  Reviewed, no issues  Last BM:  02/07/2016  Height:   Ht Readings from Last 1 Encounters:  02/01/16 5' (1.524 m)    Weight:   Wt Readings from Last 1 Encounters:  02/05/16 108 lb 14.4 oz (49.4 kg)    Ideal Body Weight:  45.45 kg  BMI:  Body mass index is 21.27 kg/m.  Estimated Nutritional Needs:   Kcal:  1200-1400  Protein:  60-75 grams  Fluid:  1.2-1.4 L/day  EDUCATION NEEDS:   No education needs identified at this time  Helane RimaLeanne Elwin Tsou, MS, RD, LDN Pager: [phone] After Hours Pager: [phone]

## 2016-02-10 NOTE — Progress Notes (Signed)
Occupational Therapy Note  Patient Details  Name: Kelly [name] MRN: [id number] Date of Birth: 06-02-1927  Today's Date: 02/10/2016 OT Missed Time: 45 Minutes Missed Time Reason: Other (comment) (OT unable to locate pt for scheduled therapy session. )   OT attempting to locate pt for 15 minutes but suspect family has taken pt off the unit. Pt missing 45 minutes of skilled OT intervention secondary to being unable to locate. RN notified.    Lowella Gripittman, Jayne Peckenpaugh L 02/10/2016, 11:46 AM

## 2016-02-10 NOTE — Progress Notes (Signed)
Occupational Therapy Session Note  Patient Details  Name: Kelly [name] MRN: [id number] Date of Birth: 1928/03/30  Today's Date: 02/10/2016 OT Individual Time: 1100-1200 OT Individual Time Calculation (min): 60 min   Short Term Goals: Week 1:  OT Short Term Goal 1 (Week 1): Pt will recall hemi-dressing techniques 2 consecutive days with min questioning cues OT Short Term Goal 2 (Week 1): Pt will complete stand pivot toilet transfer with mod A in order to decrease caregiver burden OT Short Term Goal 3 (Week 1): Pt will dress UB with min A in order to reduce caregiver burden OT Short Term Goal 4 (Week 1): Pt will maintain static standing balance for 1 minutes with min A at RW/ sink in prep for functional task  Skilled Therapeutic Interventions/Progress Updates:   1:1 OT session focused on NMRe-ed of R Ue, Bioness to R Ue, self-care, and sit<>stands. Min A to thread R UE to don shirt. Pt brought to therapy gym. Sit<>stand w/ min A, worked on weight shifting with OT facilitating proprioceptive feedback to R UE w/ raised table. Pt demonstrated trace active extension/flexion in R hand today.  Bioness used to further facilitate flexion/extension of R Ue. Pt returned to room at end of session and left with needs met.   Therapy Documentation Precautions:  Precautions Precautions: Fall Precaution Comments: R hemiplegia, R hip bursitis Restrictions Weight Bearing Restrictions: No  Pain: Pain Assessment Pain Assessment: No/denies pain Pain Score: 0-No pain   Therapy/Group: Individual Therapy  [name] 02/10/2016, 12:31 PM

## 2016-02-10 NOTE — Progress Notes (Signed)
Physical Therapy Session Note  Patient Details  Name: Kelly [name] MRN: [id number] Date of Birth: [birth year]  Today's Date: 02/10/2016 PT Individual Time: 1302-1430 PT Individual Time Calculation (min): 88 min    Short Term Goals: Week 1:  PT Short Term Goal 1 (Week 1): Pt will ambulate 30 ft with LRAD & mod assist. PT Short Term Goal 2 (Week 1): Pt will perform bed<>w/c transfers with min assist. PT Short Term Goal 3 (Week 1): Pt will demonstrate standing balance with mod assist.  Skilled Therapeutic Interventions/Progress Updates:    Pt presented in w/c agreeable for therapy.  Instructed in use of brakes and pt able to unlock brakes and propel 156f x1 with use og BLE and LUE, pt occasionally requiring cues for steering however pt able to use BLE appropriately to propel down hallway.  In rehab gym pt performed sit to stand with use of HW x8 while performing standing balance activities.  Pt required occasional cue to scoot to EOC to facilitate transfer however was able to demonstrate carryover.  Pt performed standing balance activities including wt shifting L-R, arms on bedside tray with pt performing A-P wt shifting,  Standing with LLE on 2in step to increase RLE wt bearing while using LLE on table.  Maximum standing tolerance up to 5 minutes with therapeutic breaks needed as necessary.  Pt performed squat pivot transfer to mat with modA, pt able to sit unsupported x10 minutes while performing seated balance and core activities as well as tricep push ups x 10 at mat.  Pt ambulated 534fx 1 with wall rail with maxA, tactile assist for foot placement and verbal cues for sequencing and wt shifting. After gait pt able to propel self [id number] demonstrated increased fatigue. Pt returned to room by PTA and left with all current needs met.   Therapy Documentation Precautions:  Precautions Precautions: Fall Precaution Comments: R hemiplegia, R hip bursitis Restrictions Weight Bearing  Restrictions: No General:   Vital Signs: Therapy Vitals Temp: 97.7 F (36.5 C) Temp Source: Oral Pulse Rate: 70 Resp: 18 BP: 117/65 Patient Position (if appropriate): Lying Oxygen Therapy SpO2: 98 % O2 Device: Not Delivered Pain: Pain Assessment Pain Assessment: No/denies pain   See Function Navigator for Current Functional Status.   Therapy/Group: Individual Therapy  Lakeysha Slutsky  Laray Corbit, PTA   02/10/2016, 4:06 PM

## 2016-02-10 NOTE — Progress Notes (Signed)
Speech Language Pathology Daily Session Note  Patient Details  Name: Kelly [name] MRN: [id number] Date of Birth: 1927-12-18  Today's Date: 02/10/2016 SLP Individual Time: 0903-1003 SLP Individual Time Calculation (min): 60 min   Short Term Goals: Week 1: SLP Short Term Goal 1 (Week 1): Pt will perform pharyngeal strengthening exercises with Min A verbal cues to strengthen swallow to demonstrate readiness for diet upgrade.  SLP Short Term Goal 2 (Week 1): Pt will consume trials of thin liquids without overt s/s of aspiration with Min A verbal cues for use of swallowing compensatory strategies.  SLP Short Term Goal 3 (Week 1): Pt will demonstrate efficient mastication and oral clearance of regular textures over 3 observed sessions to demonstrate readiness for diet advancment.  SLP Short Term Goal 4 (Week 1): Pt will utilize speech intelligibility strategies at the sentence level with Min A verbal cues to achieve 100% intelligibility.   Skilled Therapeutic Interventions:  Pt was seen for skilled ST targeting goals for communication and dysphagia.  SLP facilitated the session with trials of thin liquid water following oral care to continue working towards liquids progression. Pt demonstrated no overt s/s of aspiration with ~6 oz of liquids and utilized standard swallowing precautions with mod I.  Recommend implementing the water protocol to improve pt hydration and for pt's comfort.  Prognosis for advancement good with ongoing trials of thins with SLP and other staff members per parameters of the water protocol.  Therapist also facilitated the session with a verbal description task while incorporating use of a visual barrier to challenge intelligibility in a moderately distracting environment.  Pt initially required min assist verbal cues to increase vocal intensity but SLP was able to fade cues to supervision as task progressed.  Pt was returned to room and left seated on commode with husband in room,  pull string left within reach.  Husband briefly updated regarding pt's goals and progress in therapies.  All questions were answered to his satisfaction at this time.  Continue per current plan of care.    Function:  Eating Eating   Modified Consistency Diet: Yes Eating Assist Level: Supervision or verbal cues           Cognition Comprehension Comprehension assist level: Understands complex 90% of the time/cues 10% of the time  Expression   Expression assist level: Expresses basic 90% of the time/requires cueing < 10% of the time.  Social Interaction Social Interaction assist level: Interacts appropriately with others with medication or extra time (anti-anxiety, antidepressant).  Problem Solving Problem solving assist level: Solves complex problems: Recognizes & self-corrects  Memory Memory assist level: Recognizes or recalls 90% of the time/requires cueing < 10% of the time    Pain Pain Assessment Pain Assessment: No/denies pain Pain Score: 0-No pain  Therapy/Group: Individual Therapy  Gustaf Mccarter, Melanee SpryNicole L 02/10/2016, 12:25 PM

## 2016-02-10 NOTE — Progress Notes (Signed)
[place] PHYSICAL MEDICINE & REHABILITATION     PROGRESS NOTE  Subjective/Complaints:  Pt laying in bed this AM.  When I walk in the room, she states she is "excited" because she is moving her arm more.   ROS: Denies CP, SOB, N/V/D  Objective: Vital Signs: Blood pressure 133/69, pulse 72, temperature 97.5 F (36.4 C), temperature source Oral, resp. rate 18, weight 49.4 kg (108 lb 14.4 oz), SpO2 97 %. No results found. No results for input(s): WBC, HGB, HCT, PLT in the last 72 hours. No results for input(s): NA, K, CL, GLUCOSE, BUN, CREATININE, CALCIUM in the last 72 hours.  Invalid input(s): CO CBG (last 3)  No results for input(s): GLUCAP in the last 72 hours.  Wt Readings from Last 3 Encounters:  02/05/16 49.4 kg (108 lb 14.4 oz)  02/01/16 52.2 kg (115 lb)  02/20/13 56.7 kg (125 lb)    Physical Exam:  BP 133/69 (BP Location: Left Arm)   Pulse 72   Temp 97.5 F (36.4 C) (Oral)   Resp 18   Wt 49.4 kg (108 lb 14.4 oz)   SpO2 97%   BMI 21.27 kg/m  Constitutional: She appears well-developed. Frail HENT: Normocephalicand atraumatic.  Eyes: EOMand Conj are normal.  Cardiovascular: Normal rateand regular rhythm.  Respiratory: Effort normaland breath sounds normal. No respiratory distress.  GI: Soft. Bowel sounds are normal. She exhibits no distension.  Musculoskeletal: She exhibits no edemaor tenderness.  Neurological: She is alert and oriented.  Moderate dysarthria.  Follows commands.  Awareness of deficits Right facial droop +Resting tremor Motor: RUE: shoulder abduction, elbow flexion/extension 2-/5, wrist, hand 0/5 RLE: 4+/5 hip flexion, knee extension, 4+/5 ankle dorsi/plantar flexion LUE/LLE: 5/5 proximal to distal Psychiatric: She has a normal mood and affect. Her behavior is normal Skin: Skin is warmand dry   Assessment/Plan: 1. 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 Physiatrist is providing close team supervision and 24 hour management of active medical problems listed below. Physiatrist and rehab team continue to assess barriers to discharge/monitor patient progress toward functional and medical goals.  Function:  Bathing Bathing position   Position: Shower  Bathing parts Body parts bathed by patient: Right arm, Chest, Abdomen, Front perineal area, Right upper leg, Left upper leg Body parts bathed by helper: Left arm, Buttocks, Back, Right lower leg, Left lower leg  Bathing assist Assist Level:  (Mod A)      Upper Body Dressing/Undressing Upper body dressing   What is the patient wearing?: Bra, Pull over shirt/dress Bra - Perfomed by patient: Thread/unthread left bra strap Bra - Perfomed by helper: Thread/unthread right bra strap, Hook/unhook bra (pull down sports bra) Pull over shirt/dress - Perfomed by patient: Thread/unthread left sleeve, Put head through opening Pull over shirt/dress - Perfomed by helper: Thread/unthread right sleeve, Pull shirt over trunk        Upper body assist Assist Level:  (Mod A)      Lower Body Dressing/Undressing Lower body dressing   What is the patient wearing?: [employer], Shoes, Underwear, Magazine features editor - Performed by patient: Thread/unthread left underwear leg Underwear - Performed by helper: Pull underwear up/down, Thread/unthread right underwear leg Pants- Performed by patient: Thread/unthread left pants leg Pants- Performed by helper: Thread/unthread right pants leg, Pull pants up/down   Non-skid slipper socks- Performed by helper: Don/doff right sock, Don/doff left  sock   Socks - Performed by helper: Don/doff right sock, Don/doff left sock   Shoes - Performed by helper: Don/doff right shoe, Don/doff left shoe, Fasten right, Fasten left       TED Hose - Performed by helper: Don/doff right TED hose, Don/doff left  TED hose  Lower body assist Assist for lower body dressing:  (Max A)      Toileting Toileting   Toileting steps completed by patient: Performs perineal hygiene Toileting steps completed by helper: Adjust clothing prior to toileting, Adjust clothing after toileting Toileting Assistive Devices: Grab bar or rail  Toileting assist Assist level: Two helpers   Transfers Chair/bed transfer   Chair/bed transfer method: Stand pivot Chair/bed transfer assist level: Maximal assist (Pt 25 - 49%/lift and lower) Chair/bed transfer assistive device: Armrests, Patent attorneyWalker     Locomotion Ambulation     Max distance: 2725ft  Assist level: Maximal assist (Pt 25 - 49%)   Wheelchair   Type: Manual Max wheelchair distance: 19160ft  Assist Level: Supervision or verbal cues  Cognition Comprehension Comprehension assist level: Understands complex 90% of the time/cues 10% of the time  Expression Expression assist level: Expresses complex 90% of the time/cues < 10% of the time  Social Interaction Social Interaction assist level: Interacts appropriately with others with medication or extra time (anti-anxiety, antidepressant).  Problem Solving Problem solving assist level: Solves complex problems: Recognizes & self-corrects  Memory Memory assist level: Recognizes or recalls 90% of the time/requires cueing < 10% of the time     Medical Problem List and Plan: 1.  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  DVT Prophylaxis/Anticoagulation: Subcutaneous heparin. Monitor platelet counts and any signs of bleeding 3. Pain Management: Tylenol as needed 4. Dysphagia. Dysphagia #3 nectar liquids. Monitor hydration. Follow-up speech therapy 5. Neuropsych: This patient is capable of making decisions on her own behalf.  Come depressive symptoms - appreciate Neuropsych 6. Skin/Wound  Care: Routine skin checks 7. Fluids/Electrolytes/Nutrition: Routine I&O   Dietary consult  BMP WNL on 9/12 8. Blood pressure: Currently WNL, will cont to monitor 9. Essential tremor. Patient maintained on Topamax per family 10. Hyperlipidemia. Lipitor 11. Overactive bladder. Ditropan 5 mg daily at bedtime.  12. Leukocytosis: Resolved  WBCs 8.4 on 9/12 13. Prediabetes:    CBGs relatively controlled at present 14. Diastolic dysfunction: Monitor for fluid overload  LOS (Days) 7 A FACE TO FACE EVALUATION WAS PERFORMED  Jeanet Lupe Karis Jubanil Tatayana Beshears 02/10/2016 8:41 AM

## 2016-02-11 ENCOUNTER — Inpatient Hospital Stay (HOSPITAL_COMMUNITY): Payer: Medicare Other | Admitting: Occupational Therapy

## 2016-02-11 ENCOUNTER — Inpatient Hospital Stay (HOSPITAL_COMMUNITY): Payer: Medicare Other | Admitting: Physical Therapy

## 2016-02-11 ENCOUNTER — Inpatient Hospital Stay (HOSPITAL_COMMUNITY): Payer: Medicare Other | Admitting: Speech Pathology

## 2016-02-11 NOTE — Progress Notes (Signed)
Physical Therapy Session Note  Patient Details  Name: Kelly [name] MRN: [id number] Date of Birth: 1927-09-13  Today's Date: 02/11/2016 PT Individual Time: 0800-0900 PT Individual Time Calculation (min): 60 min    Short Term Goals: Week 1:  PT Short Term Goal 1 (Week 1): Pt will ambulate 30 ft with LRAD & mod assist. PT Short Term Goal 2 (Week 1): Pt will perform bed<>w/c transfers with min assist. PT Short Term Goal 3 (Week 1): Pt will demonstrate standing balance with mod assist.  Skilled Therapeutic Interventions/Progress Updates:   Pt presented sitting at EOB having completed breakfast.  PTA assisted in donning clothing and AFO for time management. Performed stand pivot transfer bed to w/c with modA and cues for hand placement/sequencing. Pt Propelled self 162f x 1 with RW with mod verbal cues for turning however good demonstration on straight paths. Pt performed sit to stand x5 with modA and use of HW.  Performed standing balance activities with increased wt shift on RUE/RLE for improved wt bearing tolerance, and use of LLE for reaching and throwing to specified object. Pt performed gait training 352fx 1 with modA, cues for sequencing and foot placement required. Pt returned to room remained in chair with all current needs met.   Therapy Documentation Precautions:  Precautions Precautions: Fall Precaution Comments: R hemiplegia, R hip bursitis Restrictions Weight Bearing Restrictions: No General:   Vital Signs:   Pain: Pain Assessment Pain Assessment: 0-10 Pain Score: 0-No pain Mobility:   Locomotion :    Trunk/Postural Assessment :    Balance:   Exercises:   Other Treatments:     See Function Navigator for Current Functional Status.   Therapy/Group: Individual Therapy  Telena Peyser  Dalayna Lauter, PTA  02/11/2016, 12:31 PM

## 2016-02-11 NOTE — Progress Notes (Signed)
Speech Language Pathology Weekly Progress and Session Note  Patient Details  Name: Kelly [name] MRN: [id number] Date of Birth: 12-Sep-1927  Beginning of progress report period:  End of progress report period:   Today's Date: 02/11/2016 SLP Individual Time: 1449-1530 SLP Individual Time Calculation (min): 41 min   Short Term Goals: Week 1: SLP Short Term Goal 1 (Week 1): Pt will perform pharyngeal strengthening exercises with Min A verbal cues to strengthen swallow to demonstrate readiness for diet upgrade.  SLP Short Term Goal 1 - Progress (Week 1): Met SLP Short Term Goal 2 (Week 1): Pt will consume trials of thin liquids without overt s/s of aspiration with Min A verbal cues for use of swallowing compensatory strategies.  SLP Short Term Goal 2 - Progress (Week 1): Met SLP Short Term Goal 3 (Week 1): Pt will demonstrate efficient mastication and oral clearance of regular textures over 3 observed sessions to demonstrate readiness for diet advancment.  SLP Short Term Goal 3 - Progress (Week 1): Progressing toward goal SLP Short Term Goal 4 (Week 1): Pt will utilize speech intelligibility strategies at the sentence level with Min A verbal cues to achieve 100% intelligibility.  SLP Short Term Goal 4 - Progress (Week 1): Met    New Short Term Goals: Week 2: SLP Short Term Goal 1 (Week 2): Pt will consume regular textures and thin liquids without overt s/s of aspiration with mod I use of swallowing precautions.  SLP Short Term Goal 2 (Week 2): Pt will utilize speech intelligibility strategies at the conversational level with supervision verbal cues to achieve 100% intelligibility.   Weekly Progress Updates: Pt has made excellent gains and has met 3 out of 4 short term goals.  Pt is making excellent progress towards advancement to thin liquids and is tolerating sips of water per the water protocol with minimal overt s/s of aspiration.  Pt is currently an overall min assist-supervision level for  intelligibility in sentences and conversations due to decreased articulatory precision resulting from oral motor weakness as well as decreased vocal intensity.  Pt would continue to benefit from skilled ST while inpatient in order to maximize functional independence and reduce burden of care prior to discharge.     Intensity: Minumum of 1-2 x/day, 30 to 90 minutes Frequency: 3 to 5 out of 7 days Duration/Length of Stay:   Treatment/Interventions: Cueing hierarchy;Patient/family education;Dysphagia/aspiration precaution training;Therapeutic Exercise   Daily Session  Skilled Therapeutic Interventions: Pt was seen for skilled ST targeting goals for intelligibility and dysphagia.  SLP facilitated the session with trials of thin liquids via cup sips following oral care to continue working towards diet advancement.  Pt demonstrated x1 immediate cough while trying to talk while taking a sip of water.  No other overt s/s of aspiration were evident and pt demonstrated appropriate rate and portion control with trials.  Recommend a trial meal tray at next available appointment to monitor for clinical s/s of aspiration with mixed solids and thin liquids determine readiness to advance diet.  SLP also facilitated the session with a novel card game to address speech intelligibility while multitasking.  Pt was overall min assist-supervision level for use of increased vocal intensity to achieve intelligibility at the sentence/conversational level.  Pt was left in recliner with call bell within reach and husband at bedside. Goals updated on this date to reflect current progress and plan of care.         Function:   Eating Eating   Modified Consistency  Diet: Yes Eating Assist Level: Swallowing techniques: self managed           Cognition Comprehension Comprehension assist level: Understands complex 90% of the time/cues 10% of the time  Expression   Expression assist level: Expresses basic 90% of the  time/requires cueing < 10% of the time.  Social Interaction Social Interaction assist level: Interacts appropriately with others with medication or extra time (anti-anxiety, antidepressant).  Problem Solving Problem solving assist level: Solves complex problems: Recognizes & self-corrects  Memory Memory assist level: Recognizes or recalls 90% of the time/requires cueing < 10% of the time   General    Pain Pain Assessment Pain Assessment: No/denies pain  Therapy/Group: Individual Therapy  Meloney Feld, Selinda Orion 02/11/2016, 8:31 PM

## 2016-02-11 NOTE — Progress Notes (Signed)
Occupational Therapy Session Note  Patient Details  Name: Kelly [name] MRN: [id number] Date of Birth: [birth year]  Today's Date: 02/11/2016 OT Individual Time: 1100-1200 OT Individual Time Calculation (min): 60 min   Short Term Goals: Week 1:  OT Short Term Goal 1 (Week 1): Pt will recall hemi-dressing techniques 2 consecutive days with min questioning cues OT Short Term Goal 2 (Week 1): Pt will complete stand pivot toilet transfer with mod A in order to decrease caregiver burden OT Short Term Goal 3 (Week 1): Pt will dress UB with min A in order to reduce caregiver burden OT Short Term Goal 4 (Week 1): Pt will maintain static standing balance for 1 minutes with min A at RW/ sink in prep for functional task  Skilled Therapeutic Interventions/Progress Updates:   1:1 OT session focused on ADL/self-care modifications, functional use of R Ue, NMRe-ed of R Ue, standing balance, and functional transfers.Pt able to utilize one-handed techniques to remove toothpaste cap sitting at the sink. She needed assistance to apply toothpaste 2/2 difficulty squeezing tube. Pt then partook in water protocol and drank ~ 3 oz. Pt then brought down to the therapy gym and worked on standing balance, weight bearing and weight shifting in standing. Pt with 1 near LOB 2/2 R knee buckle. Bioness used to facilitate R hand ROM. Difficulty with R hand extension;pt able to grasp cup, but unable to extend enough to drop cup. Pt returned to room at end of session and left with needs met.  Therapy Documentation Precautions:  Precautions Precautions: Fall Precaution Comments: R hemiplegia, R hip bursitis Restrictions Weight Bearing Restrictions: No Pain: Pain Assessment Pain Assessment: No/denies pain  See Function Navigator for Current Functional Status.   Therapy/Group: Individual Therapy  [name] 02/11/2016, 4:51 PM

## 2016-02-11 NOTE — Progress Notes (Signed)
Physical Therapy Session Note  Patient Details  Name: Kelly HumphreysBennie H Friedly MRN: [id number] Date of Birth: 1928/05/23  Today's Date: 02/11/2016 PT Individual Time: 1600-1630 PT Individual Time Calculation (min): 30 min    Skilled Therapeutic Interventions/Progress Updates:   Pt received resting in w/c with no c/o pain and agreeable to therapy session.  Pt propels w/c to and from therapy gym with BLEs for NMR and endurance.  LE strengthening and NMR with step ups to 6" step with LUE support on rail and overall mod assist.  Pt performed 8 reps step up with LLE leading for strengthening and RLE leading for NMR.  Pt returned to room at end of session and requesting to toilet.  Supervision/steady assist for toilet transfer and pt requesting privacy.  Agreed to pull cord for assistance after finishing with toileting.    Therapy Documentation Precautions:  Precautions Precautions: Fall Precaution Comments: R hemiplegia, R hip bursitis Restrictions Weight Bearing Restrictions: No   See Function Navigator for Current Functional Status.   Therapy/Group: Individual Therapy  Ladora DanielCaitlin E Penven-Crew 02/11/2016, 4:54 PM

## 2016-02-11 NOTE — Progress Notes (Signed)
[place] PHYSICAL MEDICINE & REHABILITATION     PROGRESS NOTE  Subjective/Complaints:  Pt laying in bed.  She is looking forward to drinking water today.    ROS: Denies CP, SOB, N/V/D  Objective: Vital Signs: Blood pressure 95/83, pulse 66, temperature 98.5 F (36.9 C), temperature source Oral, resp. rate 16, weight 49.4 kg (108 lb 14.4 oz), SpO2 94 %. No results found. No results for input(s): WBC, HGB, HCT, PLT in the last 72 hours. No results for input(s): NA, K, CL, GLUCOSE, BUN, CREATININE, CALCIUM in the last 72 hours.  Invalid input(s): CO CBG (last 3)  No results for input(s): GLUCAP in the last 72 hours.  Wt Readings from Last 3 Encounters:  02/05/16 49.4 kg (108 lb 14.4 oz)  02/01/16 52.2 kg (115 lb)  02/20/13 56.7 kg (125 lb)    Physical Exam:  BP 95/83 (BP Location: Left Arm)   Pulse 66   Temp 98.5 F (36.9 C) (Oral)   Resp 16   Wt 49.4 kg (108 lb 14.4 oz)   SpO2 94%   BMI 21.27 kg/m  Constitutional: She appears well-developed. Frail HENT: Normocephalicand atraumatic.  Eyes: EOMand Conj are normal.  Cardiovascular: Normal rateand regular rhythm.  Respiratory: Effort normaland breath sounds normal. No respiratory distress.  GI: Soft. Bowel sounds are normal. She exhibits no distension.  Musculoskeletal: She exhibits no edemaor tenderness.  Neurological: She is alert and oriented.  Moderate dysarthria.  Follows commands.  Awareness of deficits Right facial droop +Resting tremor Motor: RUE: shoulder abduction, elbow flexion/extension 2-/5, wrist, hand 0/5 RLE: 4+/5 hip flexion, knee extension, 4+/5 ankle dorsi/plantar flexion LUE/LLE: 5/5 proximal to distal Psychiatric: She has a normal mood and affect. Her behavior is normal Skin: Skin is warmand dry   Assessment/Plan: 1. 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 Physiatrist is providing close team supervision and 24 hour management of active medical problems listed below. Physiatrist and rehab team continue to assess barriers to discharge/monitor patient progress toward functional and medical goals.  Function:  Bathing Bathing position   Position: Wheelchair/chair at sink  Bathing parts Body parts bathed by patient: Right arm, Chest, Abdomen, Front perineal area, Right upper leg, Left upper leg Body parts bathed by helper: Left arm, Buttocks, Back, Right lower leg, Left lower leg  Bathing assist Assist Level: Touching or steadying assistance(Pt > 75%)      Upper Body Dressing/Undressing Upper body dressing   What is the patient wearing?: Pull over shirt/dress Bra - Perfomed by patient: Thread/unthread left bra strap Bra - Perfomed by helper: Thread/unthread right bra strap, Hook/unhook bra (pull down sports bra) Pull over shirt/dress - Perfomed by patient: Thread/unthread left sleeve, Put head through opening Pull over shirt/dress - Perfomed by helper: Thread/unthread right sleeve, Pull shirt over trunk        Upper body assist Assist Level: Touching or steadying assistance(Pt > 75%)      Lower Body Dressing/Undressing Lower body dressing   What is the patient wearing?: Pants Underwear - Performed by patient: Thread/unthread left underwear leg Underwear - Performed by helper: Pull underwear up/down, Thread/unthread right underwear leg Pants- Performed by patient: Thread/unthread left pants leg Pants- Performed by helper: Thread/unthread right pants leg, Pull pants up/down   Non-skid slipper socks- Performed by helper: Don/doff right sock, Don/doff left sock   Socks - Performed by helper:  Don/doff right sock, Don/doff left sock   Shoes - Performed by helper: Don/doff right shoe, Don/doff left shoe, Fasten right, Fasten left       TED Hose - Performed by helper: Don/doff right TED hose, Don/doff  left TED hose  Lower body assist Assist for lower body dressing:  (Max A)      Toileting Toileting   Toileting steps completed by patient: Performs perineal hygiene Toileting steps completed by helper: Adjust clothing prior to toileting, Adjust clothing after toileting Toileting Assistive Devices: Grab bar or rail  Toileting assist Assist level: Two helpers   Transfers Chair/bed transfer   Chair/bed transfer method: Stand pivot Chair/bed transfer assist level: Moderate assist (Pt 50 - 74%/lift or lower) Chair/bed transfer assistive device: Armrests, Patent attorney     Max distance: 66ft  Assist level: Maximal assist (Pt 25 - 49%)   Wheelchair   Type: Manual Max wheelchair distance: 129ft  Assist Level: Supervision or verbal cues  Cognition Comprehension Comprehension assist level: Understands complex 90% of the time/cues 10% of the time  Expression Expression assist level: Expresses basic 90% of the time/requires cueing < 10% of the time.  Social Interaction Social Interaction assist level: Interacts appropriately with others with medication or extra time (anti-anxiety, antidepressant).  Problem Solving Problem solving assist level: Solves complex problems: Recognizes & self-corrects  Memory Memory assist level: Recognizes or recalls 90% of the time/requires cueing < 10% of the time     Medical Problem List and Plan: 1.  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  DVT Prophylaxis/Anticoagulation: Subcutaneous heparin. Monitor platelet counts and any signs of bleeding 3. Pain Management: Tylenol as needed 4. Dysphagia. Dysphagia #3 nectar liquids. Monitor hydration. Follow-up speech therapy 5. Neuropsych: This patient is capable of making decisions on her own behalf.  Come depressive symptoms - appreciate  Neuropsych 6. Skin/Wound Care: Routine skin checks 7. Fluids/Electrolytes/Nutrition: Routine I&O   Dietary consult  BMP WNL on 9/12 8. Blood pressure: Currently WNL, will cont to monitor 9. Essential tremor. Patient maintained on Topamax per family 10. Hyperlipidemia. Lipitor 11. Overactive bladder. Ditropan 5 mg daily at bedtime.  12. Leukocytosis: Resolved  WBCs 8.4 on 9/12 13. Prediabetes:    Last CBGs controlled 14. Diastolic dysfunction: Monitor for fluid overload  LOS (Days) 8 A FACE TO FACE EVALUATION WAS PERFORMED  [name] 02/11/2016 8:25 AM

## 2016-02-12 ENCOUNTER — Inpatient Hospital Stay (HOSPITAL_COMMUNITY): Payer: Medicare Other | Admitting: Occupational Therapy

## 2016-02-12 ENCOUNTER — Inpatient Hospital Stay (HOSPITAL_COMMUNITY): Payer: Medicare Other | Admitting: Speech Pathology

## 2016-02-12 ENCOUNTER — Inpatient Hospital Stay (HOSPITAL_COMMUNITY): Payer: Medicare Other | Admitting: Physical Therapy

## 2016-02-12 MED ORDER — FLUOXETINE HCL 20 MG PO CAPS
20.0000 mg | ORAL_CAPSULE | Freq: Every day | ORAL | Status: DC
  Administered 2016-02-12 – 2016-02-21 (×10): 20 mg via ORAL
  Filled 2016-02-12 (×10): qty 1

## 2016-02-12 NOTE — Progress Notes (Signed)
[place] PHYSICAL MEDICINE & REHABILITATION     PROGRESS NOTE  Subjective/Complaints:  Pt laying in bed.  She has trace movements in her hand.  She states she needs more therapy.    ROS: Denies CP, SOB, N/V/D  Objective: Vital Signs: Blood pressure (!) 108/54, pulse 67, temperature 98 F (36.7 C), temperature source Oral, resp. rate 16, weight 49.4 kg (108 lb 14.4 oz), SpO2 96 %. No results found. No results for input(s): WBC, HGB, HCT, PLT in the last 72 hours. No results for input(s): NA, K, CL, GLUCOSE, BUN, CREATININE, CALCIUM in the last 72 hours.  Invalid input(s): CO CBG (last 3)  No results for input(s): GLUCAP in the last 72 hours.  Wt Readings from Last 3 Encounters:  02/05/16 49.4 kg (108 lb 14.4 oz)  02/01/16 52.2 kg (115 lb)  02/20/13 56.7 kg (125 lb)    Physical Exam:  BP (!) 108/54 (BP Location: Left Arm)   Pulse 67   Temp 98 F (36.7 C) (Oral)   Resp 16   Wt 49.4 kg (108 lb 14.4 oz)   SpO2 96%   BMI 21.27 kg/m  Constitutional: She appears well-developed. Frail HENT: Normocephalicand atraumatic.  Eyes: EOMand Conj are normal.  Cardiovascular: Normal rateand regular rhythm.  Respiratory: Effort normaland breath sounds normal. No respiratory distress.  GI: Soft. Bowel sounds are normal. She exhibits no distension.  Musculoskeletal: She exhibits no edemaor tenderness.  Neurological: She is alert and oriented.  Moderate dysarthria.  Follows commands.  Awareness of deficits Right facial droop +Resting tremor Motor: RUE: shoulder abduction, elbow flexion/extension 2-/5, wrist, hand 1/5 RLE: 4+/5 hip flexion, knee extension, 4+/5 ankle dorsi/plantar flexion LUE/LLE: 5/5 proximal to distal Psychiatric: She has a normal mood and affect. Her behavior is normal Skin: Skin is warmand dry   Assessment/Plan: 1. 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 Physiatrist is providing close team supervision and 24 hour management of active medical problems listed below. Physiatrist and rehab team continue to assess barriers to discharge/monitor patient progress toward functional and medical goals.  Function:  Bathing Bathing position   Position: Wheelchair/chair at sink  Bathing parts Body parts bathed by patient: Right arm, Chest, Abdomen, Front perineal area, Right upper leg, Left upper leg Body parts bathed by helper: Left arm, Buttocks, Back, Right lower leg, Left lower leg  Bathing assist Assist Level: Touching or steadying assistance(Pt > 75%)      Upper Body Dressing/Undressing Upper body dressing   What is the patient wearing?: Pull over shirt/dress Bra - Perfomed by patient: Thread/unthread left bra strap Bra - Perfomed by helper: Thread/unthread right bra strap, Hook/unhook bra (pull down sports bra) Pull over shirt/dress - Perfomed by patient: Thread/unthread left sleeve, Put head through opening Pull over shirt/dress - Perfomed by helper: Thread/unthread right sleeve, Pull shirt over trunk        Upper body assist Assist Level: Touching or steadying assistance(Pt > 75%)      Lower Body Dressing/Undressing Lower body dressing   What is the patient wearing?: Pants Underwear - Performed by patient: Thread/unthread left underwear leg Underwear - Performed by helper: Pull underwear up/down, Thread/unthread right underwear leg Pants- Performed by patient: Thread/unthread left pants leg Pants- Performed by helper: Thread/unthread right pants leg, Pull pants up/down   Non-skid slipper socks- Performed by helper: Don/doff right sock, Don/doff left  sock   Socks - Performed by helper: Don/doff right sock, Don/doff left sock   Shoes - Performed by helper: Don/doff right shoe, Don/doff left shoe, Fasten right, Fasten left       TED Hose - Performed by helper:  Don/doff right TED hose, Don/doff left TED hose  Lower body assist Assist for lower body dressing:  (Max A)      Toileting Toileting   Toileting steps completed by patient: Performs perineal hygiene Toileting steps completed by helper: Adjust clothing prior to toileting, Adjust clothing after toileting Toileting Assistive Devices: Grab bar or rail  Toileting assist Assist level: Two helpers   Transfers Chair/bed transfer   Chair/bed transfer method: Stand pivot Chair/bed transfer assist level: Moderate assist (Pt 50 - 74%/lift or lower) Chair/bed transfer assistive device: Armrests, Patent attorney     Max distance: 49ft  Assist level: Maximal assist (Pt 25 - 49%)   Wheelchair   Type: Manual Max wheelchair distance: 131ft  Assist Level: Supervision or verbal cues  Cognition Comprehension Comprehension assist level: Understands complex 90% of the time/cues 10% of the time  Expression Expression assist level: Expresses basic 90% of the time/requires cueing < 10% of the time.  Social Interaction Social Interaction assist level: Interacts appropriately with others with medication or extra time (anti-anxiety, antidepressant).  Problem Solving Problem solving assist level: Solves complex problems: Recognizes & self-corrects  Memory Memory assist level: Recognizes or recalls 90% of the time/requires cueing < 10% of the time     Medical Problem List and Plan: 1.  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  DVT Prophylaxis/Anticoagulation: Subcutaneous heparin. Monitor platelet counts and any signs of bleeding 3. Pain Management: Tylenol as needed 4. Dysphagia. Dysphagia #3 nectar liquids. Monitor hydration. Follow-up speech therapy 5. Neuropsych: This patient is capable of making decisions on her own  behalf.  Come depressive symptoms - appreciate Neuropsych 6. Skin/Wound Care: Routine skin checks 7. Fluids/Electrolytes/Nutrition: Routine I&O   Dietary consult  BMP WNL on 9/12 8. Blood pressure: Currently WNL, will cont to monitor 9. Essential tremor. Patient maintained on Topamax per family 10. Hyperlipidemia. Lipitor 11. Overactive bladder. Ditropan 5 mg daily at bedtime.  12. Leukocytosis: Resolved  WBCs 8.4 on 9/12 13. Prediabetes:    Last CBGs controlled 14. Diastolic dysfunction: Monitor for fluid overload  LOS (Days) 9 A FACE TO FACE EVALUATION WAS PERFORMED  [name] 02/12/2016 7:49 AM

## 2016-02-12 NOTE — Progress Notes (Signed)
Physical Therapy Session Note  Patient Details  Name: Kelly [name] MRN: [id number] Date of Birth: 09-01-1927  Today's Date: 02/12/2016 PT Individual Time: 1300-1400 AND 1502-1530 PT Individual Time Calculation (min): 60 min AND 28 min    Short Term Goals: Week 1:  PT Short Term Goal 1 (Week 1): Pt will ambulate 30 ft with LRAD & mod assist. PT Short Term Goal 2 (Week 1): Pt will perform bed<>w/c transfers with min assist. PT Short Term Goal 3 (Week 1): Pt will demonstrate standing balance with mod assist.  Skilled Therapeutic Interventions/Progress Updates:     Patient received sitting in WC and agreeable to PT. WC mobility training insturcted by PT for 137f with superivsion using BLE and LUE propulsion technique. Min cues for doorway management.   NMR with weight shifting in [place] 3 x 1.5 min. With prolonged rest break. PT provided min A and mod multimodal cues for improved WB through R UE as well as increased WB through RLE. Patient also required mod cues for increased gluteal activation to improve trunk and core stability with Weight shifting.   Gait training with hemiwalker and R AFO x44 ft with mod A from PT and max cues for sequencing and proper LE placement to prevent LOB to R. Gait training with shopping cart x 24fwith mod A from PT to encourage increased trunk extension as well as generate WB through R UE; patient demonstrated improved posture as well as step length with shopping cart. Gait with RW, AFO and R hand splint x 4062fith mod A from PT with max cues for maintenance of upright posture and increase step length.   Transfer training with RW for stand pivot x 4 with mod progressing to min A. Moderate cues for AD management and proper sequencing to prevent posterior LOB and improve control of RLE>   Session 2:   Patient received sitting in WC and agreeable to PT. Patient transported to rehab gym for time management with total A.  PT instructed patient in gait  training with RW, R AFO and R hand splint for 70 ft with mod A from PT. Moderate cues for posture and gluteal activation provided to improve stability with stance on R LE. Additional gait training for [id number] RW as listed above with mod A and cues for increased step height and length to improve safety.  Patient returned to room and left with call bell in reach and all needs met.    Therapy Documentation Precautions:  Precautions Precautions: Fall Precaution Comments: R hemiplegia, R hip bursitis Restrictions Weight Bearing Restrictions: No   See Function Navigator for Current Functional Status.   Therapy/Group: Individual Therapy  AustLorie Phenix0/2017, 6:11 PM

## 2016-02-12 NOTE — Progress Notes (Signed)
Occupational Therapy Weekly and Daily Progress Note  Patient Details  Name: CAITRIN PENDERGRAPH MRN: [id number] Date of Birth: Jun 16, 1927  Beginning of progress report period: February 04, 2016 End of progress report period: February 12, 2016  Today's Date: 02/12/2016 OT Individual Time: 1000-1100 OT Individual Time Calculation (min): 60 min    Patient has met 4 of 4 short term goals.  Pt is making steady progress with OT treatments at this time.  She is able to complete squat pivot transfers with Mod A and sit<>stands for bathing/dressing with Min A.   RUE function is improving with increased muscle activation proximal to distal.  Will continue with current OT treatment POC.    Patient continues to demonstrate the following deficits: muscle weakness, impaired timing and sequencing, abnormal tone, unbalanced muscle activation and decreased coordination, and decreased standing balance, decreased postural control and hemiplegia  and therefore will continue to benefit from skilled OT intervention to enhance overall performance with BADL.  Patient progressing toward long term goals..  Continue plan of care.  OT Short Term Goals Week 1:  OT Short Term Goal 1 (Week 1): Pt will recall hemi-dressing techniques 2 consecutive days with min questioning cues OT Short Term Goal 1 - Progress (Week 1): Met OT Short Term Goal 2 (Week 1): Pt will complete stand pivot toilet transfer with mod A in order to decrease caregiver burden OT Short Term Goal 2 - Progress (Week 1): Met OT Short Term Goal 3 (Week 1): Pt will dress UB with min A in order to reduce caregiver burden OT Short Term Goal 3 - Progress (Week 1): Met OT Short Term Goal 4 (Week 1): Pt will maintain static standing balance for 1 minutes with min A at RW/ sink in prep for functional task OT Short Term Goal 4 - Progress (Week 1): Met Week 2:  OT Short Term Goal 1 (Week 2): Pt will complete stand-step turn transfer to toilet with RW and Min A OT  Short Term Goal 2 (Week 2): Pt will use the RUE at a diminshed level for washing the left arm and pulling pants over hips with min A OT Short Term Goal 3 (Week 2): Pt willl demonstrate consistent use of RUE at diminished level during BADL with min vc   Skilled Therapeutic Interventions/Progress Updates:    1:1 OT session focused on modified bathing/dressing, sit<>stands, standing balance, functional use of R UE and Nmre-ed of R Ue.  OT utilized NMR-ed techniques during bathing/dressing tasks to elicit gross shoulder activation. Pt brought to therapy gym and completed NMRe-ed activities in gravity eliminated position in supine and side-lying on mat. Pt able to push and pull therapist with slight resistance and improved biceps/triceps activation. Pt returned to room at end of session and left with needs met.    Therapy Documentation Precautions:  Precautions Precautions: Fall Precaution Comments: R hemiplegia, R hip bursitis Restrictions Weight Bearing Restrictions: No Pain: None/denies pain See Function Navigator for Current Functional Status.   Therapy/Group: Individual Therapy  [name] 02/12/2016, 8:30 PM

## 2016-02-12 NOTE — Progress Notes (Signed)
Speech Language Pathology Daily Session Note  Patient Details  Name: Kelly [name] MRN: [id number] Date of Birth: [birth year]  Today's Date: 02/12/2016 SLP Individual Time: 0800-0900 SLP Individual Time Calculation (min): 60 min   Short Term Goals: Week 2: SLP Short Term Goal 1 (Week 2): Pt will consume regular textures and thin liquids without overt s/s of aspiration with mod I use of swallowing precautions.  SLP Short Term Goal 2 (Week 2): Pt will utilize speech intelligibility strategies at the conversational level with supervision verbal cues to achieve 100% intelligibility.   Skilled Therapeutic Interventions:  Pt was seen for skilled ST targeting goals for dysphagia and communication.  SLP facilitated the session with trials of thin liquids during breakfast meal.  Pt demonstrated x1 immediate, strong reflexive cough but otherwise tolerated and solids and liquids without overt s/s of aspiration.  Pt is utilizing rate and portion control to maximize swallowing safety with mod I.  Recommend advancement to thin liquids at this time.  Therapist also facilitated the session with a story generation task targeting intelligibility at the conversational level.  Pt was intelligible with supervision verbal cues for slow rate and increased vocal intensity.  Pt was left in wheelchair at the end of today's therapy session and left with call bell within reach.  Continue per current plan of care.    Function:  Eating Eating   Modified Consistency Diet: Yes Eating Assist Level: Swallowing techniques: self managed;Set up assist for   Eating Set Up Assist For: Opening containers       Cognition Comprehension Comprehension assist level: Understands complex 90% of the time/cues 10% of the time  Expression   Expression assist level: Expresses basic 90% of the time/requires cueing < 10% of the time.  Social Interaction Social Interaction assist level: Interacts appropriately with others with medication  or extra time (anti-anxiety, antidepressant).  Problem Solving Problem solving assist level: Solves complex problems: With extra time  Memory Memory assist level: Recognizes or recalls 90% of the time/requires cueing < 10% of the time    Pain Pain Assessment Pain Assessment: No/denies pain Pain Score: 0-No pain  Therapy/Group: Individual Therapy  Robbert Langlinais, Melanee SpryNicole L 02/12/2016, 9:03 AM

## 2016-02-13 ENCOUNTER — Inpatient Hospital Stay (HOSPITAL_COMMUNITY): Payer: Medicare Other | Admitting: Occupational Therapy

## 2016-02-13 ENCOUNTER — Inpatient Hospital Stay (HOSPITAL_COMMUNITY): Payer: Medicare Other | Admitting: Speech Pathology

## 2016-02-13 ENCOUNTER — Inpatient Hospital Stay (HOSPITAL_COMMUNITY): Payer: Medicare Other | Admitting: Physical Therapy

## 2016-02-13 DIAGNOSIS — D62 Acute posthemorrhagic anemia: Secondary | ICD-10-CM

## 2016-02-13 LAB — CBC WITH DIFFERENTIAL/PLATELET
Basophils Absolute: 0.1 10*3/uL (ref 0.0–0.1)
Basophils Relative: 1 %
Eosinophils Absolute: 0.3 10*3/uL (ref 0.0–0.7)
Eosinophils Relative: 4 %
HCT: 37.4 % (ref 36.0–46.0)
Hemoglobin: 11.9 g/dL — ABNORMAL LOW (ref 12.0–15.0)
Lymphocytes Relative: 29 %
Lymphs Abs: 2.5 10*3/uL (ref 0.7–4.0)
MCH: 29 pg (ref 26.0–34.0)
MCHC: 31.8 g/dL (ref 30.0–36.0)
MCV: 91 fL (ref 78.0–100.0)
Monocytes Absolute: 1.1 10*3/uL — ABNORMAL HIGH (ref 0.1–1.0)
Monocytes Relative: 12 %
Neutro Abs: 4.8 10*3/uL (ref 1.7–7.7)
Neutrophils Relative %: 55 %
Platelets: 221 10*3/uL (ref 150–400)
RBC: 4.11 MIL/uL (ref 3.87–5.11)
RDW: 13.6 % (ref 11.5–15.5)
WBC: 8.7 10*3/uL (ref 4.0–10.5)

## 2016-02-13 LAB — BASIC METABOLIC PANEL
Anion gap: 6 (ref 5–15)
BUN: 16 mg/dL (ref 6–20)
CO2: 25 mmol/L (ref 22–32)
Calcium: 8.9 mg/dL (ref 8.9–10.3)
Chloride: 108 mmol/L (ref 101–111)
Creatinine, Ser: 0.73 mg/dL (ref 0.44–1.00)
GFR calc Af Amer: >60 mL/min (ref >60)
GFR calc non Af Amer: >60 mL/min (ref >60)
Glucose, Bld: 94 mg/dL (ref 65–99)
Potassium: 3.9 mmol/L (ref 3.5–5.1)
Sodium: 139 mmol/L (ref 135–145)

## 2016-02-13 NOTE — Progress Notes (Signed)
Recreational Therapy Session Note  Patient Details  Name: Kelly HumphreysBennie H Conroy MRN: [id number] Date of Birth: [birth year] Today's Date: 02/13/2016  Pain: no c/o Skilled Therapeutic Interventions/Progress Updates: Pt lying in bed resting from previous therapies.  Leisure discussion at bedside with specific emphasis on adaptive reading & card game techniques.  Introduced use of book rack, demonstrated use.  Pt appreciative of equipment and excited to use it with the book she has in the room.  Therapy/Group: Individual Therapy   Cathan Gearin 02/13/2016, 12:19 PM

## 2016-02-13 NOTE — Progress Notes (Signed)
Occupational Therapy Session Note  Patient Details  Name: Kelly [name] MRN: [id number] Date of Birth: February 12, 1928  Today's Date: 02/13/2016 OT Individual Time: 0900-1000 OT Individual Time Calculation (min): 60 min   Short Term Goals: Week 2:  OT Short Term Goal 1 (Week 2): Pt will complete stand-step turn transfer to toilet with RW and Min A OT Short Term Goal 2 (Week 2): Pt will use the RUE at a diminshed level for washing the left arm and pulling pants over hips with min A OT Short Term Goal 3 (Week 2): Pt willl demonstrate consistent use of RUE at diminished level during BADL with min vc  Skilled Therapeutic Interventions/Progress Updates:    1:1 skilled OT session focused on bathing/dressing modifications, functional transfers, sit<>stands, standing balance, functional use of R UE . OT facilitated normal movement patterns and functional uise of R UE during bathing/dressing activities. Facilitated R shoulder flex/extension using PNF patterns and NMR-ed techniques. Pt transferred into shower with Mod A for squat pivot w/ R knee block.  Pt completed sit<>stands at the sink with Min A, able to use R UE as a stabilizer. She required Mod A to maintain standing balance when reaching to pull pants over hips 2/2 push to R. Pt left seated in w.c at end of session with needs met.   Therapy Documentation Precautions:  Precautions Precautions: Fall Precaution Comments: R hemiplegia, R hip bursitis Restrictions Weight Bearing Restrictions: No   Vital Signs: Therapy Vitals Temp: 98 F (36.7 C) Temp Source: Oral Pulse Rate: 87 Resp: 18 BP: 114/68 Patient Position (if appropriate): Sitting Oxygen Therapy SpO2: 95 % O2 Device: Not Delivered Pain:  none/denies pain See Function Navigator for Current Functional Status.   Therapy/Group: Individual Therapy  [name] 02/13/2016, 4:54 PM

## 2016-02-13 NOTE — Progress Notes (Signed)
Nutrition Follow-up  DOCUMENTATION CODES:   Not applicable  INTERVENTION:  Provide Magic cup TID with meals, each supplement provides 290 kcal and 9 grams of protein.  Encourage adequate PO intake.   NUTRITION DIAGNOSIS:   Swallowing difficulty related to dysphagia as evidenced by per patient/family report (SLP evaluation); improving  GOAL:   Patient will meet greater than or equal to 90% of their needs; met  MONITOR:   PO intake, Supplement acceptance, Weight trends, I & O's  REASON FOR ASSESSMENT:   Consult Poor PO  ASSESSMENT:   82 y.o.female with PMH of osteoporosis, essential tremor, who presented 02/01/2016 with right side weakness as well as slurred speech. MRI of the brain showed acute nonhemorrhagic 1.4 cm linear infarct within the posterior limb of the left internal capsule. CTA of head and neck negative. Patient did not receive TPA.  Dysphagia #3 nectar thick liquid diet. Patient was admitted for a comprehensive rehabilitation program  Pt reports having a good appetite is liking her food at meals. Meal completion has been varied from 20-100%, however most intake at least 50-100%. RD to continue Magic cup at meals as pt reports liking them. Pt encouraged to eat her food at meals.   Diet Order:  DIET DYS 3 Room service appropriate? Yes; Fluid consistency: Thin  Skin:  Reviewed, no issues  Last BM:  9/18  Height:   Ht Readings from Last 1 Encounters:  02/01/16 5' (1.524 m)    Weight:   Wt Readings from Last 1 Encounters:  02/05/16 108 lb 14.4 oz (49.4 kg)    Ideal Body Weight:  45.45 kg  BMI:  Body mass index is 21.27 kg/m.  Estimated Nutritional Needs:   Kcal:  1200-1400  Protein:  60-75 grams  Fluid:  1.2-1.4 L/day  EDUCATION NEEDS:   No education needs identified at this time  Corrin Parker, MS, RD, LDN Pager # [phone] After hours/ weekend pager # [phone]

## 2016-02-13 NOTE — Progress Notes (Signed)
[facility]YSICAL [facility]ICINE & REHABILITATION     PROGRESS NOTE  Subjective/Complaints:  Pt laying in bed this AM.  Spoke to her husband yesterday and answered questions regarding stroke.    ROS: Denies CP, SOB, N/V/D  Objective: Vital Signs: Blood pressure 110/61, pulse 67, temperature 98.2 F (36.8 C), temperature source Oral, resp. rate 18, weight 49.4 kg (108 lb 14.4 oz), SpO2 98 %. No results found.  Recent Labs  02/13/16 0527  WBC 8.7  HGB 11.9*  HCT 37.4  PLT 221    Recent Labs  02/13/16 0527  NA 139  K 3.9  CL 108  GLUCOSE 94  BUN 16  CREATININE 0.73  CALCIUM 8.9   CBG (last 3)  No results for input(s): GLUCAP in the last 72 hours.  Wt Readings from Last 3 Encounters:  02/05/16 49.4 kg (108 lb 14.4 oz)  02/01/16 52.2 kg (115 lb)  02/20/13 56.7 kg (125 lb)    Physical Exam:  BP 110/61 (BP Location: Left Arm)   Pulse 67   Temp 98.2 F (36.8 C) (Oral)   Resp 18   Wt 49.4 kg (108 lb 14.4 oz)   SpO2 98%   BMI 21.27 kg/m  Constitutional: She appears well-developed. Frail HENT: Normocephalicand atraumatic.  Eyes: EOMand Conj are normal.  Cardiovascular: Normal rateand regular rhythm.  Respiratory: Effort normaland breath sounds normal. No respiratory distress.  GI: Soft. Bowel sounds are normal. She exhibits no distension.  Musculoskeletal: She exhibits no edemaor tenderness.  Neurological: She is alert and oriented.  Moderate dysarthria.  Follows commands.  Awareness of deficits Right facial droop +Resting tremor Motor: RUE: shoulder abduction, elbow flexion/extension 2-/5, wrist, hand 1/5 RLE: 4+/5 hip flexion, knee extension, 4+/5 ankle dorsi/plantar flexion LUE/LLE: 5/5 proximal to distal Psychiatric: She has a normal mood and affect. Her behavior is normal Skin: Skin is warmand dry   Assessment/Plan: 1. Functional deficits secondary to linear infarct within the posterior limb of the left internal capsule which require 3+ hours  per day of interdisciplinary therapy in a comprehensive inpatient rehab setting. Physiatrist is providing close team supervision and 24 hour management of active medical problems listed below. Physiatrist and rehab team continue to assess barriers to discharge/monitor patient progress toward functional and medical goals.  Function:  Bathing Bathing position   Position: Wheelchair/chair at sink  Bathing parts Body parts bathed by patient: Right arm, Chest, Abdomen, Front perineal area, Right upper leg, Left upper leg Body parts bathed by helper: Left arm, Buttocks, Back, Right lower leg, Left lower leg  Bathing assist Assist Level: Touching or steadying assistance(Pt > 75%)      Upper Body Dressing/Undressing Upper body dressing   What is the patient wearing?: Pull over shirt/dress Bra - Perfomed by patient: Thread/unthread left bra strap Bra - Perfomed by helper: Thread/unthread right bra strap, Hook/unhook bra (pull down sports bra) Pull over shirt/dress - Perfomed by patient: Thread/unthread left sleeve, Put head through opening Pull over shirt/dress - Perfomed by helper: Thread/unthread right sleeve, Pull shirt over trunk        Upper body assist Assist Level: Touching or steadying assistance(Pt > 75%)      Lower Body Dressing/Undressing Lower body dressing   What is the patient wearing?: Pants Underwear - Performed by patient: Thread/unthread left underwear leg Underwear - Performed by helper: Pull underwear up/down, Thread/unthread right underwear leg Pants- Performed by patient: Thread/unthread left pants leg Pants- Performed by helper: Thread/unthread right pants leg, Pull pants up/down  Non-skid slipper socks- Performed by helper: Don/doff right sock, Don/doff left sock   Socks - Performed by helper: Don/doff right sock, Don/doff left sock   Shoes - Performed by helper: Don/doff right shoe, Don/doff left shoe, Fasten right, Fasten left       TED Hose - Performed by  helper: Don/doff right TED hose, Don/doff left TED hose  Lower body assist Assist for lower body dressing:  (Max A)      Toileting Toileting   Toileting steps completed by patient: Performs perineal hygiene Toileting steps completed by helper: Adjust clothing prior to toileting, Adjust clothing after toileting Toileting Assistive Devices: Grab bar or rail  Toileting assist Assist level: Two helpers   Transfers Chair/bed transfer   Chair/bed transfer method: Stand pivot Chair/bed transfer assist level: Touching or steadying assistance (Pt > 75%) Chair/bed transfer assistive device: Armrests, Patent attorneyWalker     Locomotion Ambulation     Max distance: 70 ft  Assist level: Moderate assist (Pt 50 - 74%)   Wheelchair   Type: Manual Max wheelchair distance: 110660ft  Assist Level: Supervision or verbal cues  Cognition Comprehension Comprehension assist level: Understands complex 90% of the time/cues 10% of the time  Expression Expression assist level: Expresses basic 90% of the time/requires cueing < 10% of the time.  Social Interaction Social Interaction assist level: Interacts appropriately with others with medication or extra time (anti-anxiety, antidepressant).  Problem Solving Problem solving assist level: Solves complex problems: With extra time  Memory Memory assist level: Recognizes or recalls 90% of the time/requires cueing < 10% of the time     Medical Problem List and Plan: 1.  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  DVT Prophylaxis/Anticoagulation: Subcutaneous heparin. Monitor platelet counts and any signs of bleeding 3. Pain Management: Tylenol as needed 4. Dysphagia. Dysphagia #3 nectar liquids. Monitor hydration. Follow-up speech therapy 5. Neuropsych: This patient is capable of making decisions on her own  behalf.  Come depressive symptoms - appreciate Neuropsych 6. Skin/Wound Care: Routine skin checks 7. Fluids/Electrolytes/Nutrition: Routine I&O   Dietary consult  BMP WNL on 9/12 8. Blood pressure: Currently WNL, will cont to monitor 9. Essential tremor. Patient maintained on Topamax per family 10. Hyperlipidemia. Lipitor 11. Overactive bladder. Ditropan 5 mg daily at bedtime.  12. Leukocytosis: Resolved  WBCs 8.4 on 9/12 13. Prediabetes:    Last CBGs controlled 14. Diastolic dysfunction: Monitor for fluid overload 15. ABLA  Hb 11.9 on 9/21  Cont to monitor  LOS (Days) 10 A FACE TO FACE EVALUATION WAS PERFORMED  Ankit Karis Jubanil Patel 02/13/2016 8:44 AM

## 2016-02-13 NOTE — Progress Notes (Signed)
Speech Language Pathology Daily Session Note  Patient Details  Name: Kelly HumphreysBennie H Lattin MRN: [id number] Date of Birth: 16-Feb-1928  Today's Date: 02/13/2016 SLP Individual Time: 0800-0900 SLP Individual Time Calculation (min): 60 min   Short Term Goals: Week 2: SLP Short Term Goal 1 (Week 2): Pt will consume regular textures and thin liquids without overt s/s of aspiration with mod I use of swallowing precautions.  SLP Short Term Goal 2 (Week 2): Pt will utilize speech intelligibility strategies at the conversational level with supervision verbal cues to achieve 100% intelligibility.   Skilled Therapeutic Interventions:  Pt was seen for skilled ST targeting goals for dysphagia and communication.  SLP faciltiated the session with a verbal description task with incorporation of a visual barrier to address speech intelligibility. Pt was overall supervision for use of intelligibilty strategies in a quiet environment but needed up to min assist verbal cues to increase vocal intensity with environmental background noise and distractions. Therapist also completed skilled observations with thin liquids to monitor toleration of diet upgrade. Pt demonstrated no overt s/s of aspiration with liquids and reported excellent toleration with meals as well.  Pt does not wish to proceed with trials of regular solids at this time due to ease with manipulation of soft solids in the setting of essential tremor.  Pt was returned to room and left in bathroom in nurse tech's care.  Continue per current plan of care.    Function:  Eating Eating   Modified Consistency Diet: Yes Eating Assist Level: Swallowing techniques: self managed;Set up assist for   Eating Set Up Assist For: Opening containers       Cognition Comprehension Comprehension assist level: Understands complex 90% of the time/cues 10% of the time  Expression   Expression assist level: Expresses basic 90% of the time/requires cueing < 10% of the time.   Social Interaction Social Interaction assist level: Interacts appropriately with others with medication or extra time (anti-anxiety, antidepressant).  Problem Solving Problem solving assist level: Solves complex problems: With extra time  Memory Memory assist level: Recognizes or recalls 90% of the time/requires cueing < 10% of the time    Pain Pain Assessment Pain Assessment: No/denies pain  Therapy/Group: Individual Therapy  Bascom Biel, Melanee SpryNicole L 02/13/2016, 12:27 PM

## 2016-02-13 NOTE — Progress Notes (Signed)
Physical Therapy Session Note  Patient Details  Name: Kelly [name] MRN: [id number] Date of Birth: 06/04/1927  Today's Date: 02/13/2016 PT Individual Time: 1400-1530 PT Individual Time Calculation (min): 90 min    Short Term Goals: Week 1:  PT Short Term Goal 1 (Week 1): Pt will ambulate 30 ft with LRAD & mod assist. PT Short Term Goal 2 (Week 1): Pt will perform bed<>w/c transfers with min assist. PT Short Term Goal 3 (Week 1): Pt will demonstrate standing balance with mod assist.  Skilled Therapeutic Interventions/Progress Updates:     Patient received sitting in WC and agreeable to PT.   WC mobiilty with supervision A from PT.   NMR for weight shifting into L LE and back to neutral. Foot tapping on 2 inch stpe with BUE support on RW. Max cues for improved weight shift to L to allow clearance with the R LE.  Continued NMR for weight shifting on Biobex with postural control, weight shifting, and limits of stability. All completed on level 1 x 4 minutes   Gait training for 74f, 25 ft and 536fwith RW, R hand splint and R AFO. Mod A from PT for improved weight shifting provided throughout gait with intermittent min A, but patient unable to maintain full erect posture with only min A and demonstrated decreased weight shift to L resulting in decreased step length on the R and near LOB..   Side stepping in rails for 1050f 2 Bilaterally. Mod A from PT with max cues for improved weight shifting with emphasis on Weighting to the L to allow improved steping on the LLE, and isolation of hip musculature to prevent compensation from trunk.   Nustep level 3>4 x 10 minutes for improved reciprocal movement and improved shoulder and hip activation to initiate stabilization with gait.    [facility] at the end of treatment with all needs met.   Therapy Documentation Precautions:  Precautions Precautions: Fall Precaution Comments: R hemiplegia, R hip  bursitis Restrictions Weight Bearing Restrictions: No Pain:     See Function Navigator for Current Functional Status.   Therapy/Group: Individual Therapy  AusLorie Phenix21/2017, 6:06 PM

## 2016-02-14 ENCOUNTER — Inpatient Hospital Stay (HOSPITAL_COMMUNITY): Payer: Medicare Other | Admitting: Occupational Therapy

## 2016-02-14 ENCOUNTER — Inpatient Hospital Stay (HOSPITAL_COMMUNITY): Payer: Medicare Other | Admitting: Physical Therapy

## 2016-02-14 ENCOUNTER — Inpatient Hospital Stay (HOSPITAL_COMMUNITY): Payer: Medicare Other | Admitting: Speech Pathology

## 2016-02-14 NOTE — Progress Notes (Signed)
Social Work Patient ID: Kelly HumphreysBennie H [name], female   DOB: 09/19/1927, 82 y.o.   MRN: [id number]   Kelly SloopJennifer C Dusty Raczkowski, LCSW Social Worker Signed   Patient Care Conference Date of Service: 02/14/2016 11:40 PM      Hide copied text Hover for attribution information Inpatient RehabilitationTeam Conference and Plan of Care Update Date: 02/12/2016   Time: 2:00 PM      Patient Name: Kelly [name]      Medical Record Number: [id number]  Date of Birth: 09/19/1927 Sex: Female         Room/Bed: 4W10C/4W10C-01 Payor Info: Payor: MEDICARE / Plan: MEDICARE PART A AND B / Product Type: *No Product type* /     Admitting Diagnosis: CVA  Admit Date/Time:  02/03/2016  6:46 PM Admission Comments: No comment available    Primary Diagnosis:  Right hemiparesis (HCC) Principal Problem: Right hemiparesis [facility](HCC)       Patient Active Problem List    Diagnosis Date Noted  . Acute blood loss anemia    . Adjustment disorder with depressed mood    . Cerebrovascular accident (CVA) (HCC) 02/03/2016  . Dysarthria, post-stroke    . Dysphagia, post-stroke    . Leukocytosis    . Prediabetes    . Right hemiparesis (HCC)    . Cerebral infarction due to unspecified mechanism    . Other secondary hypertension    . Overactive bladder    . Diastolic dysfunction    . Hyperlipidemia    . Essential hypertension    . Essential tremor    . Stroke (cerebrum) (HCC) 02/01/2016  . Facial droop due to stroke 02/01/2016  . Essential and other specified forms of tremor 02/20/2013  . Dysphonia 02/20/2013  . Trigeminal neuralgia 02/20/2013      Expected Discharge Date: Expected Discharge Date: 02/27/16   Team Members Present: Physician leading conference: Dr. Maryla MorrowAnkit Patel Social Worker Present: Kelly AcostaJenny Jadan Rouillard, LCSW Nurse Present: Kelly EndAngie Joyce, RN PT Present: Kelly Mantleodney [name], Kelly JewelPT;Kelly [name], PT OT Present: Other (comment) Kelly [name](Kelly [name], OT) SLP Present: Kelly LombardNicole [name], SLP PPS Coordinator present : Kelly DuckMarie Noel, RN, CRRN    Current Status/Progress Goal Weekly Team Focus  Medical   Right-sided weakness, dysarthria, dysphasia secondary to linear infarct within the posterior limb of the left internal capsule  Improve mobility, transfers, function RUE  See above   Bowel/Bladder   Continent of bowel and bladder; LBM 9/18  Remain continent while on Rehab and at discharge  Continue with plan of care   Swallow/Nutrition/ Hydration   upgraded to dys 3 with thin liquids; supervision-mod I for use of swallowing precautions   Mod I with regular and thin liquids  Toleration of thins and trials of regular solids    ADL's   Mod A bathing, dressing, some return in R UE, slight flex/extn   Min A overall  NMRe-ed of R UE, standing balance,  ADL re-training, functional transfers   Mobility   min-mod A for transfers with and without AD. min A for bed mobility. Gait up to 1250ft with HW. Supervision with WC up to 18160ft.   Supervision transfer goals with LRAD, Min A gait with LRAD.   improved endurance, improved balance, increased independence with gait, Neuro re-ed for R LE and R LE to allow increased independence with transfers and gait.    Communication   min assist-supervision  mod I  continue to address education and carryover of intelligibility at the conversational  level    Safety/Cognition/ Behavioral Observations  Pain   no c/o of pain; 650mg  Tylenol q 4hr prn  <2 on a 0-10 pain scale  assess pain q 4hr and prn   Skin   bruising to left hip and abdomen  remain free from skin breakdown and infection while on rehab  assess skin q shift and prn     Rehab Goals Patient on target to meet rehab goals: Yes Rehab Goals Revised: none *See Care Plan and progress notes for long and short-term goals.   Barriers to Discharge: Safety, dysphagia, transfers, mobility   Possible Resolutions to Barriers:  Therapies   Discharge Planning/Teaching Needs:  Pt to go home with her husband to provide supervision/min assist care.    Husband can come for family education closer to pt's d/c.   Team Discussion:  Pt is medically stable per Dr. Allena Katz. Pt's diet has been upgraded to thin liquids and she will be closely monitored for any signs of aspiration. Pt has good cognition and is getting return in her right arm.  Pt is min assist to stand and with balance and mod assist with ADLs.  Pt is doing well with PT using walker with right handsplint.  Pt has supervision to min assist goals.   Revisions to Treatment Plan:  none    Continued Need for Acute Rehabilitation Level of Care: The patient requires daily medical management by a physician with specialized training in physical medicine and rehabilitation for the following conditions: Daily direction of a multidisciplinary physical rehabilitation program to ensure safe treatment while eliciting the highest outcome that is of practical value to the patient.: Yes Daily medical management of patient stability for increased activity during participation in an intensive rehabilitation regime.: Yes Daily analysis of laboratory values and/or radiology reports with any subsequent need for medication adjustment of medical intervention for : Neurological problems   Kelly [name], Kelly [name] 02/14/2016, 11:41 PM

## 2016-02-14 NOTE — Progress Notes (Signed)
Physical Therapy Session Note  Patient Details  Name: Kelly [name] MRN: [id number] Date of Birth: [birth year]  Today's Date: 02/14/2016 PT Individual Time: 1100-1200 PT Individual Time Calculation (min): 60 min    Short Term Goals: Week 1:  PT Short Term Goal 1 (Week 1): Pt will ambulate 30 ft with LRAD & mod assist. PT Short Term Goal 2 (Week 1): Pt will perform bed<>w/c transfers with min assist. PT Short Term Goal 3 (Week 1): Pt will demonstrate standing balance with mod assist.  Skilled Therapeutic Interventions/Progress Updates:   Pt presented in chair agreeable for therapy.  Pt propelled self to rehab gym consistently using BLE and LUE to propel.  Pt transferred to [facility] to promote increased activation of RLE. Per seated with resistance 70cm/sec and standing at 40cm/sec. Tactile cues for improved posture.  Pt performed standing wt shifting including toe taps single with LLE to promote RLE wt bearing and alternating toe taps for coordination. Pt performed gait training with RW ambulating 45, 79, and 48 ft respectively with modA.  Pt requiring seated rest breaks as noted decreased R toe clearance with increasing fatigue. Continued cues for improving posture and R foot placement during gait.  Pt returned to room in w/c and all current needs met.   Therapy Documentation Precautions:  Precautions Precautions: Fall Precaution Comments: R hemiplegia, R hip bursitis Restrictions Weight Bearing Restrictions: No General:   Vital Signs:   See Function Navigator for Current Functional Status.   Therapy/Group: Individual Therapy  Sharyl Panchal  Zaylah Blecha, PTA  02/14/2016, 12:48 PM

## 2016-02-14 NOTE — Progress Notes (Signed)
Occupational Therapy Session Note  Patient Details  Name: KEONNA RAETHER MRN: [id number] Date of Birth: 1927/07/02  Today's Date: 02/14/2016 OT Individual Time:  - 0900-1000  (62 min)1st session:                                            1400-1445  (45 min) 2nd session   Short Term Goals: Week 1:  OT Short Term Goal 1 (Week 1): Pt will recall hemi-dressing techniques 2 consecutive days with min questioning cues OT Short Term Goal 1 - Progress (Week 1): Met OT Short Term Goal 2 (Week 1): Pt will complete stand pivot toilet transfer with mod A in order to decrease caregiver burden OT Short Term Goal 2 - Progress (Week 1): Met OT Short Term Goal 3 (Week 1): Pt will dress UB with min A in order to reduce caregiver burden OT Short Term Goal 3 - Progress (Week 1): Met OT Short Term Goal 4 (Week 1): Pt will maintain static standing balance for 1 minutes with min A at RW/ sink in prep for functional task OT Short Term Goal 4 - Progress (Week 1): Met Week 2:  OT Short Term Goal 1 (Week 2): Pt will complete stand-step turn transfer to toilet with RW and Min A OT Short Term Goal 2 (Week 2): Pt will use the RUE at a diminshed level for washing the left arm and pulling pants over hips with min A OT Short Term Goal 3 (Week 2): Pt willl demonstrate consistent use of RUE at diminished level during BADL with min vc  Skilled Therapeutic Interventions/Progress Updates:    OT 1st session:  Skilled OT session focused on bathing/dressing functional transfers, sit<>stands, standing balance, functional use of R UE.  Pt. Engaged in dressing at sink.  Refused bathing.  Did sit to stand at the sink with min assist for static standing balance.  Engaged in dynamic balance with reaching and donning pants, combing hair.  Transferred to toilet for urine with mod assist and cues for hand placement.   Did RUE PROM on mat .   Left in wc with all needs in place.     2nd session:  Engaged in [place] using [place].  Provided  muscle stim and then addressed Active movements without stim.  Pt demonstrated 2/5 wrist flex /ext and sup/pron.   Pt returned to room and left in wc with all needs in reach.   Therapy Documentation Precautions:  Precautions Precautions: Fall Precaution Comments: R hemiplegia, R hip bursitis Restrictions Weight Bearing Restrictions: No General:   Vital Signs: Therapy Vitals Temp: 98.3 F (36.8 C) Temp Source: Oral Pulse Rate: 60 Resp: 16 BP: (!) 116/56 Patient Position (if appropriate): Lying Oxygen Therapy SpO2: 95 % O2 Device: Not Delivered Pain:  none   See Function Navigator for Current Functional Status.   Therapy/Group: Individual Therapy  Lisa Roca 02/14/2016, 7:53 AM

## 2016-02-14 NOTE — Patient Care Conference (Signed)
Inpatient RehabilitationTeam Conference and Plan of Care Update Date: 02/12/2016   Time: 2:00 PM    Patient Name: Kelly [name]      Medical Record Number: [id number]  Date of Birth: [birth year] Sex: Female         Room/Bed: 4W10C/4W10C-01 Payor Info: Payor: MEDICARE / Plan: MEDICARE PART A AND B / Product Type: *No Product type* /    Admitting Diagnosis: CVA  Admit Date/Time:  02/03/2016  6:46 PM Admission Comments: No comment available   Primary Diagnosis:  Right hemiparesis (HCC) Principal Problem: Right hemiparesis [facility])  Patient Active Problem List   Diagnosis Date Noted  . Acute blood loss anemia   . Adjustment disorder with depressed mood   . Cerebrovascular accident (CVA) (HCC) 02/03/2016  . Dysarthria, post-stroke   . Dysphagia, post-stroke   . Leukocytosis   . Prediabetes   . Right hemiparesis (HCC)   . Cerebral infarction due to unspecified mechanism   . Other secondary hypertension   . Overactive bladder   . Diastolic dysfunction   . Hyperlipidemia   . Essential hypertension   . Essential tremor   . Stroke (cerebrum) (HCC) 02/01/2016  . Facial droop due to stroke 02/01/2016  . Essential and other specified forms of tremor 02/20/2013  . Dysphonia 02/20/2013  . Trigeminal neuralgia 02/20/2013    Expected Discharge Date: Expected Discharge Date: 02/27/16  Team Members Present: Physician leading conference: Dr. Maryla Morrow Social Worker Present: Staci Acosta, LCSW Nurse Present: Carmie End, RN PT Present: Katherine Mantle, Antonietta Jewel, PT OT Present: Other (comment) Gentry Fitz Doe, OT) SLP Present: Jackalyn Lombard, SLP PPS Coordinator present : Tora Duck, RN, CRRN     Current Status/Progress Goal Weekly Team Focus  Medical   Right-sided weakness, dysarthria, dysphasia secondary to linear infarct within the posterior limb of the left internal capsule  Improve mobility, transfers, function RUE  See above   Bowel/Bladder   Continent of bowel and bladder;  LBM 9/18  Remain continent while on Rehab and at discharge  Continue with plan of care   Swallow/Nutrition/ Hydration   upgraded to dys 3 with thin liquids; supervision-mod I for use of swallowing precautions   Mod I with regular and thin liquids  Toleration of thins and trials of regular solids    ADL's   Mod A bathing, dressing, some return in R UE, slight flex/extn   Min A overall  NMRe-ed of R UE, standing balance,  ADL re-training, functional transfers   Mobility   min-mod A for transfers with and without AD. min A for bed mobility. Gait up to 46ft with HW. Supervision with WC up to 148ft.   Supervision transfer goals with LRAD, Min A gait with LRAD.   improved endurance, improved balance, increased independence with gait, Neuro re-ed for R LE and R LE to allow increased independence with transfers and gait.    Communication   min assist-supervision  mod I  continue to address education and carryover of intelligibility at the conversational  level    Safety/Cognition/ Behavioral Observations            Pain   no c/o of pain; 650mg  Tylenol q 4hr prn  <2 on a 0-10 pain scale  assess pain q 4hr and prn   Skin   bruising to left hip and abdomen  remain free from skin breakdown and infection while on rehab  assess skin q shift and prn    Rehab Goals Patient on target to meet  rehab goals: Yes Rehab Goals Revised: none *See Care Plan and progress notes for long and short-term goals.  Barriers to Discharge: Safety, dysphagia, transfers, mobility    Possible Resolutions to Barriers:  Therapies    Discharge Planning/Teaching Needs:  Pt to go home with her husband to provide supervision/min assist care.  Husband can come for family education closer to pt's d/c.   Team Discussion:  Pt is medically stable per Dr. Allena KatzPatel. Pt's diet has been upgraded to thin liquids and she will be closely monitored for any signs of aspiration. Pt has good cognition and is getting return in her right arm.   Pt is min assist to stand and with balance and mod assist with ADLs.  Pt is doing well with PT using walker with right handsplint.  Pt has supervision to min assist goals.   Revisions to Treatment Plan:  none   Continued Need for Acute Rehabilitation Level of Care: The patient requires daily medical management by a physician with specialized training in physical medicine and rehabilitation for the following conditions: Daily direction of a multidisciplinary physical rehabilitation program to ensure safe treatment while eliciting the highest outcome that is of practical value to the patient.: Yes Daily medical management of patient stability for increased activity during participation in an intensive rehabilitation regime.: Yes Daily analysis of laboratory values and/or radiology reports with any subsequent need for medication adjustment of medical intervention for : Neurological problems  Shraddha Lebron, Vista DeckJennifer Capps 02/14/2016, 11:41 PM

## 2016-02-14 NOTE — Progress Notes (Signed)
[place] PHYSICAL MEDICINE & REHABILITATION     PROGRESS NOTE  Subjective/Complaints:  Pt laying in bed this AM.  She is pleased that she can "move my elbow more", but notes she still cannot do much with her hand.    ROS: Denies CP, SOB, N/V/D  Objective: Vital Signs: Blood pressure (!) 116/56, pulse 60, temperature 98.3 F (36.8 C), temperature source Oral, resp. rate 16, weight 49.4 kg (108 lb 14.4 oz), SpO2 95 %. No results found.  Recent Labs  02/13/16 0527  WBC 8.7  HGB 11.9*  HCT 37.4  PLT 221    Recent Labs  02/13/16 0527  NA 139  K 3.9  CL 108  GLUCOSE 94  BUN 16  CREATININE 0.73  CALCIUM 8.9   CBG (last 3)  No results for input(s): GLUCAP in the last 72 hours.  Wt Readings from Last 3 Encounters:  02/05/16 49.4 kg (108 lb 14.4 oz)  02/01/16 52.2 kg (115 lb)  02/20/13 56.7 kg (125 lb)    Physical Exam:  BP (!) 116/56 (BP Location: Left Arm)   Pulse 60   Temp 98.3 F (36.8 C) (Oral)   Resp 16   Wt 49.4 kg (108 lb 14.4 oz)   SpO2 95%   BMI 21.27 kg/m  Constitutional: She appears well-developed. Frail HENT: Normocephalicand atraumatic.  Eyes: EOMand Conj are normal.  Cardiovascular: Normal rateand regular rhythm.  Respiratory: Effort normaland breath sounds normal. No respiratory distress.  GI: Soft. Bowel sounds are normal. She exhibits no distension.  Musculoskeletal: She exhibits no edemaor tenderness.  Neurological: She is alert and oriented.  Moderate dysarthria.  Follows commands.  Good Awareness of deficits Right facial droop +Resting tremor Motor: RUE: shoulder abduction, elbow flexion/extension 3+/5, wrist, hand 1/5 RLE: 4+/5 hip flexion, knee extension, 4+/5 ankle dorsi/plantar flexion LUE/LLE: 5/5 proximal to distal Psychiatric: She has a normal mood and affect. Her behavior is normal Skin: Skin is warmand dry   Assessment/Plan: 1. 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 Physiatrist is providing close team supervision and 24 hour management of active medical problems listed below. Physiatrist and rehab team continue to assess barriers to discharge/monitor patient progress toward functional and medical goals.  Function:  Bathing Bathing position   Position: Shower  Bathing parts Body parts bathed by patient: Right arm, Abdomen, Chest, Front perineal area, Right upper leg, Left upper leg, Right lower leg, Left lower leg Body parts bathed by helper: Left arm, Buttocks, Back  Bathing assist Assist Level: Touching or steadying assistance(Pt > 75%)      Upper Body Dressing/Undressing Upper body dressing   What is the patient wearing?: Pull over shirt/dress Bra - Perfomed by patient: Thread/unthread left bra strap Bra - Perfomed by helper: Thread/unthread right bra strap, Hook/unhook bra (pull down sports bra) Pull over shirt/dress - Perfomed by patient: Thread/unthread left sleeve, Put head through opening Pull over shirt/dress - Perfomed by helper: Pull shirt over trunk, Thread/unthread right sleeve        Upper body assist Assist Level: Touching or steadying assistance(Pt > 75%)      Lower Body Dressing/Undressing Lower body dressing   What is the patient wearing?: Pants, AFO Underwear - Performed by patient: Thread/unthread left underwear leg Underwear - Performed by helper: Pull underwear up/down, Thread/unthread right underwear leg Pants- Performed by patient: Thread/unthread left pants leg  Pants- Performed by helper: Thread/unthread right pants leg, Pull pants up/down   Non-skid slipper socks- Performed by helper: Don/doff right sock, Don/doff left sock Socks - Performed by patient: Don/doff left sock Socks - Performed by helper: Don/doff right sock   Shoes - Performed by helper: Don/doff left shoe, Don/doff right shoe    AFO - Performed by helper: Don/doff right AFO   TED Hose - Performed by helper: Don/doff right TED hose, Don/doff left TED hose  Lower body assist Assist for lower body dressing: Touching or steadying assistance (Pt > 75%)      Toileting Toileting   Toileting steps completed by patient: Performs perineal hygiene Toileting steps completed by helper: Adjust clothing prior to toileting, Adjust clothing after toileting Toileting Assistive Devices: Grab bar or rail  Toileting assist Assist level: Touching or steadying assistance (Pt.75%)   Transfers Chair/bed transfer   Chair/bed transfer method: Stand pivot Chair/bed transfer assist level: Moderate assist (Pt 50 - 74%/lift or lower) Chair/bed transfer assistive device: Armrests, Patent attorney     Max distance: 31ft  Assist level: Moderate assist (Pt 50 - 74%)   Wheelchair   Type: Manual Max wheelchair distance: 143ft  Assist Level: Supervision or verbal cues  Cognition Comprehension Comprehension assist level: Follows complex conversation/direction with extra time/assistive device  Expression Expression assist level: Expresses basic needs/ideas: With no assist  Social Interaction Social Interaction assist level: Interacts appropriately with others with medication or extra time (anti-anxiety, antidepressant).  Problem Solving Problem solving assist level: Solves basic problems with no assist  Memory Memory assist level: Recognizes or recalls 90% of the time/requires cueing < 10% of the time     Medical Problem List and Plan: 1.  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  DVT Prophylaxis/Anticoagulation: Subcutaneous heparin. Monitor platelet counts and any signs of bleeding 3. Pain Management: Tylenol as needed 4. Dysphagia. Dysphagia #3 nectar  liquids. Monitor hydration. Follow-up speech therapy 5. Neuropsych: This patient is capable of making decisions on her own behalf.  Come depressive symptoms - appreciate Neuropsych 6. Skin/Wound Care: Routine skin checks 7. Fluids/Electrolytes/Nutrition: Routine I&O   Dietary consult  BMP WNL on 9/12 8. Blood pressure: Currently WNL, will cont to monitor 9. Essential tremor. Patient maintained on Topamax per family 10. Hyperlipidemia. Lipitor 11. Overactive bladder. Ditropan 5 mg daily at bedtime.  12. Leukocytosis: Resolved 13. Prediabetes:    Glucose WNL 14. Diastolic dysfunction: Monitor for fluid overload 15. ABLA  Hb 11.9 on 9/21  Cont to monitor  LOS (Days) 11 A FACE TO FACE EVALUATION WAS PERFORMED  Acen Craun Karis Juba 02/14/2016 9:12 AM

## 2016-02-14 NOTE — Progress Notes (Signed)
Speech Language Pathology Daily Session Note  Patient Details  Name: Kelly [name] MRN: [id number] Date of Birth: 1928/04/12  Today's Date: 02/14/2016 SLP Individual Time: [id number] SLP Individual Time Calculation (min): 42 min   Short Term Goals:Week 2: SLP Short Term Goal 1 (Week 2): Pt will consume dys 3 textures and thin liquids without overt s/s of aspiration with mod I use of swallowing precautions.  SLP Short Term Goal 1 - Progress (Week 2): Other (comment) (Revised due to pt preference ) SLP Short Term Goal 2 (Week 2): Pt will utilize speech intelligibility strategies at the conversational level with supervision verbal cues to achieve 100% intelligibility.   Skilled Therapeutic Interventions:  Pt was seen for skilled ST targeting goals for dysphagia and cognition.  SLP completed skilled observations during presentations of pt's currently prescribed diet with pt demonstrating only x1 immediate, reflexive cough with thin liquids.  Suspect tremor when self feeding possibly impacting bolus flow leading to intermittent premature spillage of liquids into the pharynx.  Recommend that pt continue on dys 3, thin liquids with intermittent supervision for use of swallowing precautions.  Pt called in her lunch and dinner orders over the telephone with mod I use of increased vocal intensity, overarticulation, and slow rate for 100% intelligibility in conversations.  Pt was left in wheelchair at the end of today's therapy session with call bell within reach.  Continue per current plan of care.    Function:  Eating Eating   Modified Consistency Diet: Yes Eating Assist Level: Swallowing techniques: self managed           Cognition Comprehension Comprehension assist level: Follows complex conversation/direction with extra time/assistive device  Expression   Expression assist level: Expresses basic needs/ideas: With no assist  Social Interaction Social Interaction assist level: Interacts  appropriately with others with medication or extra time (anti-anxiety, antidepressant).  Problem Solving Problem solving assist level: Solves basic problems with no assist  Memory Memory assist level: Recognizes or recalls 90% of the time/requires cueing < 10% of the time    Pain Pain Assessment Pain Assessment: No/denies pain  Therapy/Group: Individual Therapy  Azha Constantin, Melanee SpryNicole L 02/14/2016, 8:45 AM

## 2016-02-14 NOTE — Progress Notes (Signed)
Social Work Patient ID: Kelly [name], female   DOB: 12/04/1927, 82 y.o.   MRN: [id number]   CSW met with pt and her husband on 02-12-16 to update them on team conference discussion.  Pt is encouraged with her progress and looking forward to more rehab and progress, but also to getting home.  Husband said that someone from the family will be with family all the time.  He asked if pt needed to go to skilled nursing facility, but CSW explained that team does not feel that is necessary at this time.  Pt/ husband pleased.  CSW will continue to follow and assist as needed.   

## 2016-02-15 ENCOUNTER — Inpatient Hospital Stay (HOSPITAL_COMMUNITY): Payer: Medicare Other | Admitting: Occupational Therapy

## 2016-02-15 NOTE — Progress Notes (Signed)
Occupational Therapy Session Note  Patient Details  Name: Kelly [name] MRN: [id number] Date of Birth: 24-Jul-1927  Today's Date: 02/15/2016 OT Individual Time: 8206-0156 OT Individual Time Calculation (min): 46 min     Short Term Goals: Week 1:  OT Short Term Goal 1 (Week 1): Pt will recall hemi-dressing techniques 2 consecutive days with min questioning cues OT Short Term Goal 1 - Progress (Week 1): Met OT Short Term Goal 2 (Week 1): Pt will complete stand pivot toilet transfer with mod A in order to decrease caregiver burden OT Short Term Goal 2 - Progress (Week 1): Met OT Short Term Goal 3 (Week 1): Pt will dress UB with min A in order to reduce caregiver burden OT Short Term Goal 3 - Progress (Week 1): Met OT Short Term Goal 4 (Week 1): Pt will maintain static standing balance for 1 minutes with min A at RW/ sink in prep for functional task OT Short Term Goal 4 - Progress (Week 1): Met  Skilled Therapeutic Interventions/Progress Updates:     Patient seen for skilled intervention targeting hemi dressing techniques, self-care and increasing independence with ADLs. Patient completed dressing from sink level in wheelchair with min assistance for identifying hemi dressing techniques. Patient completed UE dressing with max assistance for doffing BUE and threading right upper extremity and pulling shirt down over shoulders. Patient completed LE dressing with max assistance for donning pants. Pt was able to thread left lower extremity but required some assistance for threading right. Pt required assistance for clothing management. Patient was able to doff bilateral socks with stand by assistance but required assistance for donning bilateral socks, shoes and AFO. Pt completed toileting with max assistance. Pt was able to complete peri care but required assistance for clothing management. Pt completed all transfers with mod assistance. Pt left in room with phone, call button and drink within reach.  Pt tolerated session well.   Therapy Documentation Precautions:  Precautions Precautions: Fall Precaution Comments: R hemiplegia, R hip bursitis Restrictions Weight Bearing Restrictions: No General:   Vital Signs: Therapy Vitals Temp: 98.5 F (36.9 C) Temp Source: Oral Pulse Rate: 66 Resp: 17 BP: (!) 114/54 Patient Position (if appropriate): Lying Oxygen Therapy SpO2: 96 % O2 Device: Not Delivered Pain: Pt did not report any pain this day.    ADL:   Exercises:   Other Treatments:    See Function Navigator for Current Functional Status.   Therapy/Group: Individual Therapy  Zachery Conch 02/15/2016, 9:29 AM

## 2016-02-15 NOTE — Progress Notes (Signed)
Kelly [name] is a 82 y.o. female [birth year] [id number]  Subjective: No new complaints - feels she is moving a little better in her arm. No new problems. Slept well. Feeling OK.  Objective: Vital signs in last 24 hours: Temp:  [98.5 F (36.9 C)-98.6 F (37 C)] 98.5 F (36.9 C) (09/23 0558) Pulse Rate:  [66-79] 66 (09/23 0558) Resp:  [17] 17 (09/23 0558) BP: (103-114)/(54-57) 114/54 (09/23 0558) SpO2:  [96 %-99 %] 96 % (09/23 0558) Weight change:  Last BM Date: 02/10/16  Intake/Output from previous day: 09/22 0701 - 09/23 0700 In: 720 [P.O.:720] Out: -   Physical Exam General: No apparent distress   Dysarthria  Lungs: Normal effort. Lungs clear to auscultation, no crackles or wheezes. Cardiovascular: Regular rate and rhythm, no edema Neurological: No new neurological deficits RHP U>L   Lab Results: BMET    Component Value Date/Time   NA 139 02/13/2016 0527   K 3.9 02/13/2016 0527   CL 108 02/13/2016 0527   CO2 25 02/13/2016 0527   GLUCOSE 94 02/13/2016 0527   BUN 16 02/13/2016 0527   CREATININE 0.73 02/13/2016 0527   CALCIUM 8.9 02/13/2016 0527   GFRNONAA >60 02/13/2016 0527   GFRAA >60 02/13/2016 0527   CBC    Component Value Date/Time   WBC 8.7 02/13/2016 0527   RBC 4.11 02/13/2016 0527   HGB 11.9 (L) 02/13/2016 0527   HCT 37.4 02/13/2016 0527   PLT 221 02/13/2016 0527   MCV 91.0 02/13/2016 0527   MCH 29.0 02/13/2016 0527   MCHC 31.8 02/13/2016 0527   RDW 13.6 02/13/2016 0527   LYMPHSABS 2.5 02/13/2016 0527   MONOABS 1.1 (H) 02/13/2016 0527   EOSABS 0.3 02/13/2016 0527   BASOSABS 0.1 02/13/2016 0527   CBG's (last 3):  No results for input(s): GLUCAP in the last 72 hours. LFT's Lab Results  Component Value Date   ALT 14 02/04/2016   AST 19 02/04/2016   ALKPHOS 74 02/04/2016   BILITOT 0.5 02/04/2016    Studies/Results: No results found.  Medications:  I have reviewed the patient's current medications. Scheduled Medications: . aspirin  300  mg Rectal Daily   Or  . aspirin  325 mg Oral Daily  . atorvastatin  40 mg Oral q1800  . FLUoxetine  20 mg Oral Daily  . heparin  5,000 Units Subcutaneous Q8H  . oxybutynin  5 mg Oral QHS  . pantoprazole  40 mg Oral Daily  . topiramate  25 mg Oral BID   PRN Medications: acetaminophen **OR** acetaminophen, ondansetron **OR** ondansetron (ZOFRAN) IV, senna-docusate, sorbitol  Assessment/Plan: Principal Problem:   Right hemiparesis (HCC) Active Problems:   Dysarthria, post-stroke   Dysphagia, post-stroke   Cerebrovascular accident (CVA) (HCC)   Adjustment disorder with depressed mood   Acute blood loss anemia Medical Problem List and Plan: 1. Right-sided weakness, dysarthria, dysphasiasecondary to linear infarct within the posterior limb of the left internal capsule             Cont CIR             Bracing ordered             Fluoxetine started 9/12, increased on 9/20 2. DVT Prophylaxis/Anticoagulation: Subcutaneous heparin. Monitor platelet counts and any signs of bleeding 3. Pain Management: Tylenol as needed 4. Dysphagia. Dysphagia #3 nectar liquids. Monitor hydration. Follow-up speech therapy 5. Neuropsych: This patient iscapable of making decisions onherown behalf.  Come depressive symptoms - appreciate Neuropsych 6. Skin/Wound Care: Routine skin checks 7. Fluids/Electrolytes/Nutrition: Routine I&O              Dietary consult             BMP WNL on 9/12 8.Blood pressure: Currently WNL, will cont to monitor 9.Essential tremor. Patient maintained on Topamax per family 10.Hyperlipidemia. Lipitor 11.Overactive bladder. Ditropan5 mg daily at bedtime.  12. Leukocytosis: Resolved 13. Prediabetes:                       Glucose WNL 14. Diastolic dysfunction: Monitor for fluid overload 15. ABLA             Hb 11.9 on 9/21             Cont to monitor  Length of stay, days: 12   Sheronica Corey A. Felicity CoyerLeschber, MD 02/15/2016, 10:52 AM

## 2016-02-16 ENCOUNTER — Inpatient Hospital Stay (HOSPITAL_COMMUNITY): Payer: Medicare Other

## 2016-02-16 NOTE — Progress Notes (Signed)
Kelly [name] is a 82 y.o. female [birth year] [id number]  Subjective: Pt feels she is moving a little better in her arm. No new problems.   Objective: Vital signs in last 24 hours: Temp:  [97.5 F (36.4 C)-98.1 F (36.7 C)] 97.5 F (36.4 C) (09/24 0607) Pulse Rate:  [68-79] 68 (09/24 0607) Resp:  [17-18] 18 (09/24 0607) BP: (109-124)/(55-66) 124/55 (09/24 0607) SpO2:  [95 %] 95 % (09/24 0607) Weight change:  Last BM Date: 02/14/16  Intake/Output from previous day: 09/23 0701 - 09/24 0700 In: 720 [P.O.:720] Out: -   Physical Exam General: No apparent distress   Dysarthria  Lungs: Normal effort. Lungs clear to auscultation, no crackles or wheezes. Cardiovascular: Regular rate and rhythm, no edema Neurological: No new neurological deficits RHP U>L Skin w/aging changes  Lab Results: BMET    Component Value Date/Time   NA 139 02/13/2016 0527   K 3.9 02/13/2016 0527   CL 108 02/13/2016 0527   CO2 25 02/13/2016 0527   GLUCOSE 94 02/13/2016 0527   BUN 16 02/13/2016 0527   CREATININE 0.73 02/13/2016 0527   CALCIUM 8.9 02/13/2016 0527   GFRNONAA >60 02/13/2016 0527   GFRAA >60 02/13/2016 0527   CBC    Component Value Date/Time   WBC 8.7 02/13/2016 0527   RBC 4.11 02/13/2016 0527   HGB 11.9 (L) 02/13/2016 0527   HCT 37.4 02/13/2016 0527   PLT 221 02/13/2016 0527   MCV 91.0 02/13/2016 0527   MCH 29.0 02/13/2016 0527   MCHC 31.8 02/13/2016 0527   RDW 13.6 02/13/2016 0527   LYMPHSABS 2.5 02/13/2016 0527   MONOABS 1.1 (H) 02/13/2016 0527   EOSABS 0.3 02/13/2016 0527   BASOSABS 0.1 02/13/2016 0527   CBG's (last 3):  No results for input(s): GLUCAP in the last 72 hours. LFT's Lab Results  Component Value Date   ALT 14 02/04/2016   AST 19 02/04/2016   ALKPHOS 74 02/04/2016   BILITOT 0.5 02/04/2016    Studies/Results: No results found.  Medications:  I have reviewed the patient's current medications. Scheduled Medications: . aspirin  300 mg Rectal Daily   Or  . aspirin  325 mg Oral Daily  . atorvastatin  40 mg Oral q1800  . FLUoxetine  20 mg Oral Daily  . heparin  5,000 Units Subcutaneous Q8H  . oxybutynin  5 mg Oral QHS  . pantoprazole  40 mg Oral Daily  . topiramate  25 mg Oral BID   PRN Medications: acetaminophen **OR** acetaminophen, ondansetron **OR** ondansetron (ZOFRAN) IV, senna-docusate, sorbitol  Assessment/Plan: Principal Problem:   Right hemiparesis (HCC) Active Problems:   Dysarthria, post-stroke   Dysphagia, post-stroke   Cerebrovascular accident (CVA) (HCC)   Adjustment disorder with depressed mood   Acute blood loss anemia Medical Problem List and Plan: 1. Right-sided weakness, dysarthria, dysphasiasecondary to linear infarct within the posterior limb of the left internal capsule             Cont CIR             Bracing ordered             Fluoxetine started 9/12, increased on 9/20 2. DVT Prophylaxis/Anticoagulation: Subcutaneous heparin. Monitor platelet counts and any signs of bleeding 3. Pain Management: Tylenol as needed 4. Dysphagia. Dysphagia #3 nectar liquids. Monitor hydration. Follow-up speech therapy 5. Neuropsych: This patient iscapable of making decisions onherown behalf.             Come depressive symptoms -  appreciate Neuropsych 6. Skin/Wound Care: Routine skin checks 7. Fluids/Electrolytes/Nutrition: Routine I&O              Dietary consult             BMP WNL on 9/12 8.Blood pressure: Currently WNL, will cont to monitor 9.Essential tremor. Patient maintained on Topamax per family 10.Hyperlipidemia. Lipitor 11.Overactive bladder. Ditropan5 mg daily at bedtime.  12. Leukocytosis: Resolved 13. Prediabetes:                       Glucose WNL 14. Diastolic dysfunction: Monitor for fluid overload 15. ABLA             Hb 11.9 on 9/21             Cont to monitor  Cont current Rx  Length of stay, days: 13   Valerie A. Felicity CoyerLeschber, MD 02/16/2016, 2:04 PM

## 2016-02-16 NOTE — Progress Notes (Signed)
Physical Therapy Session Note  Patient Details  Name: Kelly HumphreysBennie H [name] MRN: [id number] Date of Birth: 03-May-1928  Today's Date: 02/16/2016 PT Individual Time: [id number] PT Individual Time Calculation (min): 45 min    Short Term Goals: Week 1:  PT Short Term Goal 1 (Week 1): Pt will ambulate 30 ft with LRAD & mod assist. PT Short Term Goal 2 (Week 1): Pt will perform bed<>w/c transfers with min assist. PT Short Term Goal 3 (Week 1): Pt will demonstrate standing balance with mod assist.  Skilled Therapeutic Interventions/Progress Updates:    Session focused on neuro re-ed to address balance, coordination, postural control, and functional use of RUE/RLE during functional tasks such as dressing and performing hygiene at sink (in both seated and standing positions). Pt requires cues for technique and extra time to complete tasks ranging from min to mod assist overall. Pt able to maintain balance with min assist while working on pulling up pants and brushing teeth at sink - weightbearing through RUE on sink (PT supported arm) and on RW with hand orthosis. Gait training with RW (with R hand orthosis and AFO) in hallway with focus on step length, postural control, and weighshifting  And coordinating positioning of RW during turns.    Therapy Documentation Precautions:  Precautions Precautions: Fall Precaution Comments: R hemiplegia, R hip bursitis Restrictions Weight Bearing Restrictions: No  Denies pain.  See Function Navigator for Current Functional Status.   Therapy/Group: Individual Therapy  Kelly [name], Kelly [name]  Kelly [name], PT, DPT  02/16/2016, 10:00 AM

## 2016-02-17 ENCOUNTER — Inpatient Hospital Stay (HOSPITAL_COMMUNITY): Payer: Medicare Other | Admitting: Speech Pathology

## 2016-02-17 ENCOUNTER — Inpatient Hospital Stay (HOSPITAL_COMMUNITY): Payer: Medicare Other | Admitting: Occupational Therapy

## 2016-02-17 ENCOUNTER — Inpatient Hospital Stay (HOSPITAL_COMMUNITY): Payer: Medicare Other | Admitting: Physical Therapy

## 2016-02-17 DIAGNOSIS — K5901 Slow transit constipation: Secondary | ICD-10-CM

## 2016-02-17 MED ORDER — SENNOSIDES-DOCUSATE SODIUM 8.6-50 MG PO TABS
1.0000 | ORAL_TABLET | Freq: Two times a day (BID) | ORAL | Status: DC
  Administered 2016-02-17 – 2016-02-21 (×7): 1 via ORAL
  Filled 2016-02-17 (×8): qty 1

## 2016-02-17 NOTE — Progress Notes (Signed)
Occupational Therapy Session Note  Patient Details  Name: Kelly [name] MRN: [id number] Date of Birth: Jan 20, 1928  Today's Date: 02/17/2016  Session 1 OT Individual Time: 1100-1200 OT Individual Time Calculation (min): 60 min   Session 2 OT Individual Time: 1300-1345 OT Individual Time Calculation (min): 45 min    Short Term Goals: Week 2:  OT Short Term Goal 1 (Week 2): Pt will complete stand-step turn transfer to toilet with RW and Min A OT Short Term Goal 2 (Week 2): Pt will use the RUE at a diminshed level for washing the left arm and pulling pants over hips with min A OT Short Term Goal 3 (Week 2): Pt willl demonstrate consistent use of RUE at diminished level during BADL with min vc  Skilled Therapeutic Interventions/Progress Updates:    Session 1 1:1 skilled OT session focused on NMRe-ed of R UE , grasp/release,  And transfers. Pt brought to therapy gym and completed stand-pivot transfer with Min/Mod A to therapy mat. In sidelying, utilized NMR techniques to elicit gross R UE ROM. Pt with improved shoulder activation and able to push/pull ball and maintain UE position 50% of the time. OT facilitated R UE weaight-bearing, joint compressions, and PNF patterns during functional seated activities. Pt ambulated short distance in room with RW and R hand strap; Pt left seated in w/c at end of session and left with needs met.   Session 2 Second 1:1 skilled OT session focused on dynamic standing balance, functional use of R UE , and postural awareness. Pt brought to therapy gym, came to standing with min A and maintain dynamic standing balance with overall Min A while reaching outside base of support to complete standing activity. OT incorporated R UE weight bearing and worked on grasp/release of larger objects. Pt with improved gross grasp, but continues to have difficulty extending palm to release objects. Pt returned to room at end of session and left with needs met.   Therapy  Documentation Precautions:  Precautions Precautions: Fall Precaution Comments: R hemiplegia, R hip bursitis Restrictions Weight Bearing Restrictions: No Pain:   none/no pain   See Function Navigator for Current Functional Status.   Therapy/Group: Individual Therapy  [name] 02/17/2016, 4:19 PM

## 2016-02-17 NOTE — Progress Notes (Signed)
Physical Therapy Note  Patient Details  Name: Kelly HumphreysBennie H Kawabata MRN: [id number] Date of Birth: 29-Apr-1928 Today's Date: 02/17/2016    Time: 730-811 41 minutes  1:1 no c/o pain.  Pt performed seated and standing balance for dressing, with supervision for seated balance, standing balance with mod A with 1 UE support.  W/c mobility with supervision with bilat LEs.  Gait with RW 20', 25', 6535' with mod A with RW with Rt wrist splint.  Stair negation with 1 UE support 4 stairs x 2 with mod A to ascend stairs, max A to descend stairs with cues for safety. Pt encouraged to ask family to install railing to assist with stair negotiation for home entry.   DONAWERTH,KAREN 02/17/2016, 8:15 AM

## 2016-02-17 NOTE — Progress Notes (Signed)
Speech Language Pathology Daily Session Note  Patient Details  Name: Kelly [name] MRN: [id number] Date of Birth: 04/29/1928  Today's Date: 02/17/2016 SLP Individual Time: [id number] SLP Individual Time Calculation (min): 44 min   Short Term Goals:Week 2: SLP Short Term Goal 1 (Week 2): Pt will consume dys 3 textures and thin liquids without overt s/s of aspiration with mod I use of swallowing precautions.  SLP Short Term Goal 1 - Progress (Week 2): Other (comment) (Revised due to pt preference ) SLP Short Term Goal 2 (Week 2): Pt will utilize speech intelligibility strategies at the conversational level with supervision verbal cues to achieve 100% intelligibility.   Skilled Therapeutic Interventions:  Pt was seen for skilled ST targeting communication goals.  SLP facilitated the session with a verbal description task with incorporation of a visual barrier to continue addressing speech intelligibility.  Pt was intelligible at the conversational level in a mildly noisy and distracting environment with supervision verbal cues to increase vocal intensity.  Pt reported fatigue with communication over the weekend after she had visitors who stayed for and extended period of time which in turn impacted her intelligibility.  Pt was returned to room at the end of today's therapy session and left with call bell within reach.  Given that pt is making good progress towards meeting long term goals and is demonstrating improved carryover of intelligibility strategies, recommend decreasing pt to 3x/week for ST.   Function:  Eating Eating                 Cognition Comprehension Comprehension assist level: Follows complex conversation/direction with extra time/assistive device  Expression   Expression assist level: Expresses basic 90% of the time/requires cueing < 10% of the time.  Social Interaction Social Interaction assist level: Interacts appropriately with others with medication or extra time  (anti-anxiety, antidepressant).  Problem Solving Problem solving assist level: Solves basic problems with no assist  Memory Memory assist level: Recognizes or recalls 90% of the time/requires cueing < 10% of the time    Pain Pain Assessment Pain Assessment: No/denies pain  Therapy/Group: Individual Therapy  Dwyne Hasegawa, Melanee SpryNicole L 02/17/2016, 9:29 AM

## 2016-02-17 NOTE — Progress Notes (Signed)
[place] [facility]YSICAL MEDICINE & REHABILITATION     PROGRESS NOTE  Subjective/Complaints:  Pt sitting up at the EOB.  She states she no longer has arthritis pain.  She has improved function of her right arm/hand.  ROS: +Constipation. Denies CP, SOB, N/V/D  Objective: Vital Signs: Blood pressure (!) 100/46, pulse 67, temperature 97.9 F (36.6 C), temperature source Oral, resp. rate 16, weight 49.4 kg (108 lb 14.4 oz), SpO2 93 %. No results found. No results for input(s): WBC, HGB, HCT, PLT in the last 72 hours. No results for input(s): NA, K, CL, GLUCOSE, BUN, CREATININE, CALCIUM in the last 72 hours.  Invalid input(s): CO CBG (last 3)  No results for input(s): GLUCAP in the last 72 hours.  Wt Readings from Last 3 Encounters:  02/05/16 49.4 kg (108 lb 14.4 oz)  02/01/16 52.2 kg (115 lb)  02/20/13 56.7 kg (125 lb)    Physical Exam:  BP (!) 100/46 (BP Location: Left Arm)   Pulse 67   Temp 97.9 F (36.6 C) (Oral)   Resp 16   Wt 49.4 kg (108 lb 14.4 oz)   SpO2 93%   BMI 21.27 kg/m  Constitutional: She appears well-developed. Frail HENT: Normocephalicand atraumatic.  Eyes: EOMand Conj are normal.  Cardiovascular: Normal rateand regular rhythm.  Respiratory: Effort normaland breath sounds normal. No respiratory distress.  GI: Soft. Bowel sounds are normal. She exhibits no distension.  Musculoskeletal: She exhibits no edemaor tenderness.  Neurological: She is alert and oriented.  Moderate dysarthria.  Follows commands.  Good Awareness of deficits Right facial droop +Resting tremor Motor: RUE: shoulder abduction, elbow flexion/extension 4-/5, wrist, hand 2/5 RLE: 4+/5 hip flexion, knee extension, 4+/5 ankle dorsi/plantar flexion LUE/LLE: 5/5 proximal to distal Psychiatric: She has a normal mood and affect. Her behavior is normal Skin: Skin is warmand dry   Assessment/Plan: 1. 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 Physiatrist is providing close team supervision and 24 hour management of active medical problems listed below. Physiatrist and rehab team continue to assess barriers to discharge/monitor patient progress toward functional and medical goals.  Function:  Bathing Bathing position Bathing activity did not occur: Refused Position: Wheelchair/chair at sink  Bathing parts Body parts bathed by patient: Right upper leg, Left upper leg, Right lower leg, Left lower leg, Front perineal area, Abdomen, Chest Body parts bathed by helper: Left arm, Right arm, Buttocks, Back  Bathing assist Assist Level: Touching or steadying assistance(Pt > 75%)      Upper Body Dressing/Undressing Upper body dressing   What is the patient wearing?: Pull over shirt/dress Bra - Perfomed by patient: Thread/unthread right bra strap, Thread/unthread left bra strap Bra - Perfomed by helper: Hook/unhook bra (pull down sports bra) Pull over shirt/dress - Perfomed by patient: Thread/unthread right sleeve, Thread/unthread left sleeve, Put head through opening Pull over shirt/dress - Perfomed by helper: Pull shirt over trunk        Upper body assist Assist Level: Touching or steadying assistance(Pt > 75%)      Lower Body Dressing/Undressing Lower body dressing   What is the patient wearing?: Pants, Shoes, AFO Underwear - Performed by patient: Thread/unthread left underwear leg Underwear - Performed by helper: Pull underwear up/down, Thread/unthread right underwear leg Pants- Performed by patient: Thread/unthread left pants leg Pants- Performed by helper: Thread/unthread right pants leg, Pull pants up/down  Non-skid slipper socks- Performed by helper: Don/doff right sock, Don/doff left sock Socks - Performed by patient: Don/doff left sock (doffed left sock) Socks - Performed by helper: Don/doff right  sock, Don/doff left sock   Shoes - Performed by helper: Don/doff right shoe, Don/doff left shoe, Fasten right, Fasten left   AFO - Performed by helper: Don/doff right AFO   TED Hose - Performed by helper: Don/doff right TED hose, Don/doff left TED hose  Lower body assist Assist for lower body dressing: Touching or steadying assistance (Pt > 75%) (min assist in standing with RW to pull up pants)      Toileting Toileting   Toileting steps completed by patient: Performs perineal hygiene Toileting steps completed by helper: Adjust clothing prior to toileting, Adjust clothing after toileting Toileting Assistive Devices: Grab bar or rail  Toileting assist Assist level: Touching or steadying assistance (Pt.75%)   Transfers Chair/bed transfer   Chair/bed transfer method: Stand pivot Chair/bed transfer assist level: Moderate assist (Pt 50 - 74%/lift or lower) Chair/bed transfer assistive device: Walker, Armrests, Orthosis     Locomotion Ambulation     Max distance: 3165' Assist level: Moderate assist (Pt 50 - 74%)   Wheelchair   Type: Manual Max wheelchair distance: 14460ft  Assist Level: Supervision or verbal cues  Cognition Comprehension Comprehension assist level: Follows complex conversation/direction with extra time/assistive device  Expression Expression assist level: Expresses basic needs/ideas: With no assist  Social Interaction Social Interaction assist level: Interacts appropriately with others with medication or extra time (anti-anxiety, antidepressant).  Problem Solving Problem solving assist level: Solves basic problems with no assist  Memory Memory assist level: Recognizes or recalls 90% of the time/requires cueing < 10% of the time     Medical Problem List and Plan: 1.  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   DVT Prophylaxis/Anticoagulation: Subcutaneous heparin. Monitor platelet counts and any signs of bleeding 3. Pain Management: Tylenol as needed 4. Dysphagia. Dysphagia #3 nectar liquids, advanced to D3 thin  Cont to advance as tolerated  Monitor hydration. Follow-up speech therapy 5. Neuropsych: This patient is capable of making decisions on her own behalf.  Come depressive symptoms - appreciate Neuropsych 6. Skin/Wound Care: Routine skin checks 7. Fluids/Electrolytes/Nutrition: Routine I&O   Dietary consult  BMP WNL on 9/21 8. Blood pressure: Currently WNL, will cont to monitor 9. Essential tremor. Patient maintained on Topamax per family 10. Hyperlipidemia. Lipitor 11. Overactive bladder. Ditropan 5 mg daily at bedtime.  12. Leukocytosis: Resolved 13. Prediabetes:    Glucose WNL 14. Diastolic dysfunction: Monitor for fluid overload 15. ABLA  Hb 11.9 on 9/21  Cont to monitor 16. Constipation  Daily bowel increased 9/25  LOS (Days) 14 A FACE TO FACE EVALUATION WAS PERFORMED  Ankit Karis Jubanil Patel 02/17/2016 9:03 AM

## 2016-02-18 ENCOUNTER — Inpatient Hospital Stay (HOSPITAL_COMMUNITY): Payer: Medicare Other | Admitting: Physical Therapy

## 2016-02-18 ENCOUNTER — Inpatient Hospital Stay (HOSPITAL_COMMUNITY): Payer: Medicare Other | Admitting: Occupational Therapy

## 2016-02-18 ENCOUNTER — Inpatient Hospital Stay (HOSPITAL_COMMUNITY): Payer: Medicare Other | Admitting: Speech Pathology

## 2016-02-18 MED ORDER — POLYETHYLENE GLYCOL 3350 17 G PO PACK
17.0000 g | PACK | Freq: Every day | ORAL | Status: DC
  Administered 2016-02-19: 17 g via ORAL
  Filled 2016-02-18 (×3): qty 1

## 2016-02-18 NOTE — Progress Notes (Signed)
Physical Therapy Session Note  Patient Details  Name: Kelly [name] MRN: [id number] Date of Birth: 1927/12/11  Today's Date: 02/18/2016 PT Individual Time: 0800-0900 PT Individual Time Calculation (min): 60 min    Short Term Goals: Week 1:  PT Short Term Goal 1 (Week 1): Pt will ambulate 30 ft with LRAD & mod assist. PT Short Term Goal 2 (Week 1): Pt will perform bed<>w/c transfers with min assist. PT Short Term Goal 3 (Week 1): Pt will demonstrate standing balance with mod assist.  Skilled Therapeutic Interventions/Progress Updates:    Session focused on stand pivot transfers to toilet using grab bar with min A, dynamic standing balance with mod A for hygiene and clothing management, UB/LB dressing from wheelchair, wheelchair propulsion using BLE and RUE to and from gym in controlled environment with supervision, gait training using RW with R hand orthosis and R AFO x 120 ft with min A overall for weight shift to L and upright posture, RLE neuro re-ed step taps to 4" step using LLE for R hip abduction strengthening/stance stability and using RLE for hip flexion strengthening 2 x 10 each LE, side stepping to R and L using rail in hallway with mod A overall. Patient required max verbal cues throughout session for upright posture, safe use of RW and R hand orthosis, and safe hand placement for sit to and from stand transfers using RW and arm rest. Patient left sitting in wheelchair with all needs within reach.   Therapy Documentation Precautions:  Precautions Precautions: Fall Precaution Comments: R hemiplegia, R hip bursitis Restrictions Weight Bearing Restrictions: No Pain: Pain Assessment Pain Assessment: No/denies pain  See Function Navigator for Current Functional Status.   Therapy/Group: Individual Therapy  Kerney ElbeVarner, [name] A 02/18/2016, 9:04 AM

## 2016-02-18 NOTE — Progress Notes (Signed)
Physical Therapy Weekly Progress Note  Patient Details  Name: Kelly [name] MRN: [id number] Date of Birth: Aug 21, 1927  Beginning of progress report period: February 04, 2016 End of progress report period: February 18, 2016  Patient has met 3 of 3 short term goals. Patient continues to be motivated and make steady progress toward goals. Patient currently requires min-mod A overall with use of RW and hand orthosis. Plan for family training to be completed prior to discharge planned next week.   Patient continues to demonstrate the following deficits: muscle weakness, decreased cardiorespiratory endurance, decreased coordination decreased sitting balance, decreased attention to R, decreased standing balance, hemiplegia, decreased postural control, decreased balance strategies and therefore will continue to benefit from skilled PT intervention to enhance overall performance with activity tolerance, balance, postural control, ability to compensate for deficits, functional use of  right upper extremity and right lower extremity, attention, awareness and coordination.  Patient progressing toward long term goals.  Continue plan of care.  PT Short Term Goals Week 1:  PT Short Term Goal 1 (Week 1): Pt will ambulate 30 ft with LRAD & mod assist. PT Short Term Goal 1 - Progress (Week 1): Met PT Short Term Goal 2 (Week 1): Pt will perform bed<>w/c transfers with min assist. PT Short Term Goal 2 - Progress (Week 1): Met PT Short Term Goal 3 (Week 1): Pt will demonstrate standing balance with mod assist. PT Short Term Goal 3 - Progress (Week 1): Met Week 2:    Week 3:  PT Short Term Goal 1 (Week 3): = LTGs due to anticipated LOS   Carney Living A 02/18/2016, 9:21 PM

## 2016-02-18 NOTE — Progress Notes (Signed)
Speech Language Pathology Weekly Progress and Session Note  Patient Details  Name: Kelly [name] MRN: [id number] Date of Birth: 10-06-1927  Beginning of progress report period:  February 11, 2016  End of progress report period: February 18, 2016   Today's Date: 02/18/2016 SLP Individual Time: 0730-0800 SLP Individual Time Calculation (min): 30 min   Short Term Goals: Week 2: SLP Short Term Goal 1 (Week 2): Pt will consume dys 3 textures and thin liquids without overt s/s of aspiration with mod I use of swallowing precautions.  SLP Short Term Goal 1 - Progress (Week 2): Met SLP Short Term Goal 2 (Week 2): Pt will utilize speech intelligibility strategies at the conversational level with supervision verbal cues to achieve 100% intelligibility.  SLP Short Term Goal 2 - Progress (Week 2): Met    New Short Term Goals: Week 3: SLP Short Term Goal 1 (Week 3): Pt will utilize speech intelligibility strategies at the conversational level with mod I 100% intelligibility.   Weekly Progress Updates: Pt has made functional gains this reporting period and has met 2 out of 2 short term goals.  Pt is consuming her currently prescribed diet with mod I use of swallowing precautions.  Pt currently requests to remain on dys 3 solids due to ease of self feeding with soft solids due to essential tremor.  Will update goals if pt's preference changes.  Pt needs supervision verbal cues for intelligibility in conversations due to decreased vocal intensity and imprecise articulation of consonants.  Pt would continue to benefit from skilled ST while inpatient in order to address intelligibility.  Recommend decreasing frequency to 3x/week given quick progress made towards meeting long term goals.  Pt and family education is ongoing.       Intensity: Minumum of 1-2 x/day, 30 to 90 minutes Frequency: 1 to 3 out of 7 days Duration/Length of Stay: 10 to 20 days Treatment/Interventions: Cueing hierarchy;Patient/family  education;Dysphagia/aspiration precaution training;Therapeutic Exercise   Daily Session  Skilled Therapeutic Interventions: Pt was seen for skilled ST targeting communication goals.  SLP facilitated the session with a verbal reasoning task targeting intelligibility in conversations.  Pt was intelligible in conversations during the abovementioned task as well as during functional activities to direct her care with only intermittent supervision cues for repetition to clarify.  Pt was left in wheelchair with call bell within reach.  Goals updated on this date to reflect current progress and plan of care.        Function:   Eating Eating                 Cognition Comprehension Comprehension assist level: Follows complex conversation/direction with extra time/assistive device  Expression   Expression assist level: Expresses basic 90% of the time/requires cueing < 10% of the time.  Social Interaction Social Interaction assist level: Interacts appropriately with others with medication or extra time (anti-anxiety, antidepressant).  Problem Solving Problem solving assist level: Solves basic problems with no assist  Memory Memory assist level: Recognizes or recalls 90% of the time/requires cueing < 10% of the time   General    Pain Pain Assessment Pain Assessment: No/denies pain  Therapy/Group: Individual Therapy  Cane Dubray, Selinda Orion 02/18/2016, 12:09 PM

## 2016-02-18 NOTE — Progress Notes (Signed)
[place] PHYSICAL MEDICINE & REHABILITATION     PROGRESS NOTE  Subjective/Complaints:  Pt sitting up in her chair working with SLP.  She continues to have improvement in function in her LUE.  ROS:  Denies CP, SOB, N/V/D  Objective: Vital Signs: Blood pressure 112/61, pulse 65, temperature 98.8 F (37.1 C), temperature source Oral, resp. rate 18, weight 49.4 kg (108 lb 14.4 oz), SpO2 98 %. No results found. No results for input(s): WBC, HGB, HCT, PLT in the last 72 hours. No results for input(s): NA, K, CL, GLUCOSE, BUN, CREATININE, CALCIUM in the last 72 hours.  Invalid input(s): CO CBG (last 3)  No results for input(s): GLUCAP in the last 72 hours.  Wt Readings from Last 3 Encounters:  02/05/16 49.4 kg (108 lb 14.4 oz)  02/01/16 52.2 kg (115 lb)  02/20/13 56.7 kg (125 lb)    Physical Exam:  BP 112/61 (BP Location: Left Arm)   Pulse 65   Temp 98.8 F (37.1 C) (Oral)   Resp 18   Wt 49.4 kg (108 lb 14.4 oz)   SpO2 98%   BMI 21.27 kg/m  Constitutional: She appears well-developed. Frail HENT: Normocephalicand atraumatic.  Eyes: EOMand Conj are normal.  Cardiovascular: Normal rateand regular rhythm.  Respiratory: Effort normaland breath sounds normal. No respiratory distress.  GI: Soft. Bowel sounds are normal. She exhibits no distension.  Musculoskeletal: She exhibits no edemaor tenderness.  Neurological: She is alert and oriented.  Moderate dysarthria.  Follows commands.  Good Awareness of deficits Right facial droop +Resting tremor Motor: RUE: shoulder abduction, elbow flexion/extension 4-/5, wrist 3-/5, hand 2/5 RLE: 4+/5 hip flexion, knee extension, 4+/5 ankle dorsi/plantar flexion LUE/LLE: 5/5 proximal to distal Psychiatric: She has a normal mood and affect. Her behavior is normal Skin: Skin is warmand dry   Assessment/Plan: 1. 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 Physiatrist is providing close team supervision and 24 hour management of active medical problems listed below. Physiatrist and rehab team continue to assess barriers to discharge/monitor patient progress toward functional and medical goals.  Function:  Bathing Bathing position Bathing activity did not occur: Refused Position: Wheelchair/chair at sink  Bathing parts Body parts bathed by patient: Right upper leg, Left upper leg, Right lower leg, Left lower leg, Front perineal area, Abdomen, Chest Body parts bathed by helper: Left arm, Right arm, Buttocks, Back  Bathing assist Assist Level: Touching or steadying assistance(Pt > 75%)      Upper Body Dressing/Undressing Upper body dressing   What is the patient wearing?: Pull over shirt/dress Bra - Perfomed by patient: Thread/unthread right bra strap, Thread/unthread left bra strap Bra - Perfomed by helper: Hook/unhook bra (pull down sports bra) Pull over shirt/dress - Perfomed by patient: Thread/unthread right sleeve, Thread/unthread left sleeve, Put head through opening Pull over shirt/dress - Perfomed by helper: Pull shirt over trunk        Upper body assist Assist Level: Touching or steadying assistance(Pt > 75%)      Lower Body Dressing/Undressing Lower body dressing   What is the patient wearing?: Pants, Shoes, AFO Underwear - Performed by patient: Thread/unthread left underwear leg Underwear - Performed by helper: Pull underwear up/down, Thread/unthread right underwear leg Pants- Performed by patient: Thread/unthread left pants leg Pants- Performed by helper: Thread/unthread right pants leg, Pull pants up/down   Non-skid slipper socks- Performed by  helper: Don/doff right sock, Don/doff left sock Socks - Performed by patient: Don/doff left sock (doffed left sock) Socks - Performed by helper: Don/doff right sock, Don/doff left sock   Shoes -  Performed by helper: Don/doff right shoe, Don/doff left shoe, Fasten right, Fasten left   AFO - Performed by helper: Don/doff right AFO   TED Hose - Performed by helper: Don/doff right TED hose, Don/doff left TED hose  Lower body assist Assist for lower body dressing: Touching or steadying assistance (Pt > 75%) (min assist in standing with RW to pull up pants)      Toileting Toileting   Toileting steps completed by patient: Performs perineal hygiene Toileting steps completed by helper: Adjust clothing prior to toileting, Adjust clothing after toileting Toileting Assistive Devices: Grab bar or rail  Toileting assist Assist level: Touching or steadying assistance (Pt.75%)   Transfers Chair/bed transfer   Chair/bed transfer method: Ambulatory Chair/bed transfer assist level: Touching or steadying assistance (Pt > 75%) Chair/bed transfer assistive device: Armrests, Patent attorney     Max distance: 120 ft Assist level: Touching or steadying assistance (Pt > 75%)   Wheelchair   Type: Manual Max wheelchair distance: 160ft  Assist Level: Supervision or verbal cues  Cognition Comprehension Comprehension assist level: Follows complex conversation/direction with extra time/assistive device  Expression Expression assist level: Expresses basic 90% of the time/requires cueing < 10% of the time.  Social Interaction Social Interaction assist level: Interacts appropriately with others with medication or extra time (anti-anxiety, antidepressant).  Problem Solving Problem solving assist level: Solves basic problems with no assist  Memory Memory assist level: Recognizes or recalls 90% of the time/requires cueing < 10% of the time     Medical Problem List and Plan: 1.  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  DVT  Prophylaxis/Anticoagulation: Subcutaneous heparin. Monitor platelet counts and any signs of bleeding 3. Pain Management: Tylenol as needed 4. Dysphagia. Dysphagia #3 nectar liquids, advanced to D3 thin  Cont to advance as tolerated  Monitor hydration. Follow-up speech therapy 5. Neuropsych: This patient is capable of making decisions on her own behalf.  Come depressive symptoms - appreciate Neuropsych 6. Skin/Wound Care: Routine skin checks 7. Fluids/Electrolytes/Nutrition: Routine I&O   Dietary consult  BMP WNL on 9/21 8. Blood pressure: Currently WNL, will cont to monitor 9. Essential tremor. Patient maintained on Topamax per family 10. Hyperlipidemia. Lipitor 11. Overactive bladder. Ditropan 5 mg daily at bedtime.  12. Leukocytosis: Resolved 13. Prediabetes:    Glucose WNL 14. Diastolic dysfunction: Monitor for fluid overload 15. ABLA  Hb 11.9 on 9/21  Cont to monitor 16. Constipation  Daily bowel increased 9/25, again on 9/26  Will cont to monitor  LOS (Days) 15 A FACE TO FACE EVALUATION WAS PERFORMED  Carlotta Telfair Karis Juba 02/18/2016 9:12 AM

## 2016-02-18 NOTE — Progress Notes (Signed)
Occupational Therapy Session Note  Patient Details  Name: Kelly [name] MRN: [id number] Date of Birth: 10-27-1927  Today's Date: 02/18/2016  Session 1 OT Individual Time: 1000-1100 OT Individual Time Calculation (min): 60 min   Session 2 OT Individual Time: 1400-1445 OT Individual Time Calculation (min): 45 min    Short Term Goals: Week 2:  OT Short Term Goal 1 (Week 2): Pt will complete stand-step turn transfer to toilet with RW and Min A OT Short Term Goal 2 (Week 2): Pt will use the RUE at a diminshed level for washing the left arm and pulling pants over hips with min A OT Short Term Goal 3 (Week 2): Pt willl demonstrate consistent use of RUE at diminished level during BADL with min vc  Skilled Therapeutic Interventions/Progress Updates:    Session 1 1:1 OT session focused on modified LB dressing, standing balance, and R UE NMRe-ed. Squat-pivot with overall min/mod A with instructional cues to bring weight forward. NMR-techniques utillized to [employer] Ue. OT facilitated normal movement patterns and PNF patterns in sitting and standing.  Pt required overall Min A to maintain standing balance and min tactile cues to facilitate midline posture.   Session 2 Second 1:1 OT session focused on sit<>stands, dynamic sitting and standing balance, and functional use of R Ue. Pt propelled w/c to therapy gym with supervision and OT facilitated standing balance activities reaching outside base of support. OT utilized NMR and hand-over hand techniques to facilitate R UE gross shoulder/elbow/wrist/hand activation. Pt returned to room at end of session.   Therapy Documentation Precautions:  Precautions Precautions: Fall Precaution Comments: R hemiplegia, R hip bursitis Restrictions Weight Bearing Restrictions: No General:   Vital Signs: Therapy Vitals Temp: 98.3 F (36.8 C) Temp Source: Oral Pulse Rate: 89 Resp: 18 BP: 114/75 Patient Position (if appropriate): Sitting Oxygen Therapy SpO2: 96  % O2 Device: Not Delivered Pain:   none/denies pain  See Function Navigator for Current Functional Status.   Therapy/Group: Individual Therapy  [name] 02/18/2016, 4:39 PM

## 2016-02-19 ENCOUNTER — Inpatient Hospital Stay (HOSPITAL_COMMUNITY): Payer: Medicare Other | Admitting: Physical Therapy

## 2016-02-19 ENCOUNTER — Inpatient Hospital Stay (HOSPITAL_COMMUNITY): Payer: Medicare Other | Admitting: Speech Pathology

## 2016-02-19 ENCOUNTER — Inpatient Hospital Stay (HOSPITAL_COMMUNITY): Payer: Medicare Other | Admitting: *Deleted

## 2016-02-19 ENCOUNTER — Inpatient Hospital Stay (HOSPITAL_COMMUNITY): Payer: Medicare Other | Admitting: Occupational Therapy

## 2016-02-19 NOTE — Progress Notes (Signed)
Recreational Therapy Session Note  Patient Details  Name: Kelly [name] MRN: [id number] Date of Birth: [birth year] Today's Date: 02/19/2016  Pain: no c/o Skilled Therapeutic Interventions/Progress Updates: Session focused on activity tolerance, standing balance, ambulation using RW & discharge planning during co-treat with PT.  Pt propelled w/c to therapy gym with supervision using BLEs and LUE.  Pt ambulating using RW with contact guard assist, max assist for turning.  Pt performed sit-stands with focus on anterior weight shifting, wbing through BLE's with Max assist & instructional cues.  Therapy/Group: Co-Treatment   Maddux First 02/19/2016, 4:30 PM

## 2016-02-19 NOTE — Progress Notes (Signed)
Occupational Therapy Weekly Progress Note  Patient Details  Name: Kelly [name] MRN: [id number] Date of Birth: [birth year]  Beginning of progress report period: February 04, 2016 End of progress report period: February 19, 2016  Today's Date: 02/19/2016 OT Individual Time: 0800-0900 OT Individual Time Calculation (min): 60 min   Patient has met 2 of 3 short term goals.  Pt is making progress with OT treatments at this time.  R UE function continues to improve with improved proximal>distal movement. Pt able to complete stand-pivot transfers with overall mod A 2/2 difficulty w/ transitional movements. Pt is improving with hemi-techniques for bathing and dressing at overall mod A, She continues to require min progressing to mod A to maintain standing balance during functional tasks. Pt concerned she will not be ready to dc home next week. Case manager to look into possible SNF placement at dc.  Will continue with current OT treatment POC.    Patient continues to demonstrate the following deficits: muscle weakness, impaired timing and sequencing, abnormal tone, decreased coordination and decreased motor planning, decreased problem solving and decreased safety awareness and decreased standing balance, decreased postural control, hemiplegia and decreased balance strategies, and therefore will continue to benefit from skilled OT intervention to enhance overall performance with BADL and Reduce care partner burden.  Patient progressing toward long term goals..  Continue plan of care.  OT Short Term Goals Week 3:  OT Short Term Goal 1 (Week 3): LTG=STG  Skilled Therapeutic Interventions/Progress Updates:   1:1 skilled OT session focused on modified bathing/dressing, standing balance, functional transfers, and functional use of R Ue. Pt completed stand-pivot transfers with overall Min/mod A with VC for hand placemment and technique. Pt continues to have a tendency to push to R and requires Mod cues to  return to midline. OT facilitated functional use of R UE during bathing tasks using washing mit with overall Min A. Pt required Min A for UB dressing today with Min verbal cues for hemi-technique. Pt left in w/c at end of session.  Therapy Documentation Precautions:  Precautions Precautions: Fall Precaution Comments: R hemiplegia, R hip bursitis Restrictions Weight Bearing Restrictions: No  Pain: Pain Assessment Pain Assessment: No/denies pain  See Function Navigator for Current Functional Status.  Therapy/Group: Individual Therapy  [name] 02/19/2016, 9:59 AM

## 2016-02-19 NOTE — Progress Notes (Signed)
Social Work Patient ID: Kelly [name], female   DOB: 11-Sep-1927, 82 y.o.   MRN: [id number]   Lynnda Child, LCSW Social Worker Signed   Patient Care Conference Date of Service: 02/19/2016  1:23 PM      Hide copied text Hover for attribution information Inpatient RehabilitationTeam Conference and Plan of Care Update Date: 02/19/2016   Time: 1:00 PM      Patient Name: Kelly [name]      Medical Record Number: [id number]  Date of Birth: July 02, 1927 Sex: Female         Room/Bed: 4W10C/4W10C-01 Payor Info: Payor: MEDICARE / Plan: MEDICARE PART A AND B / Product Type: *No Product type* /     Admitting Diagnosis: CVA  Admit Date/Time:  02/03/2016  6:46 PM Admission Comments: No comment available    Primary Diagnosis:  Right hemiparesis ([place]) Principal Problem: Right hemiparesis [facility])       Patient Active Problem List    Diagnosis Date Noted  . Slow transit constipation    . Acute blood loss anemia    . Adjustment disorder with depressed mood    . Cerebrovascular accident (CVA) ([place]) 02/03/2016  . Dysarthria, post-stroke    . Dysphagia, post-stroke    . Leukocytosis    . Prediabetes    . Right hemiparesis ([place])    . Cerebral infarction due to unspecified mechanism    . Other secondary hypertension    . Overactive bladder    . Diastolic dysfunction    . Hyperlipidemia    . Essential hypertension    . Essential tremor    . Stroke (cerebrum) ([place]) 02/01/2016  . Facial droop due to stroke 02/01/2016  . Essential and other specified forms of tremor 02/20/2013  . Dysphonia 02/20/2013  . Trigeminal neuralgia 02/20/2013      Expected Discharge Date: Expected Discharge Date: 02/27/16 (vs. SNF)   Team Members Present: Physician leading conference: Dr. 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  Improve mobility, transfers, confidence  See above   Bowel/Bladder   continent bowel and bladder , LBM 02/18/16  min assist  monitor q shift   Swallow/Nutrition/ Hydration   dys 3, thin liquids with intermittent supervision per pt request   mod I  goal met, will trial regular textures if pt requests    ADL's   Mod A LB dressing, improved R UE shoulder activiation, Min/Mod A functional transfers  Min A overall  NMRe-ed of R UE, functional transfers, standing balance,    Mobility   min-mod A overall using RW with R hand orthosis and AFO  supervision-min A  R NMR, standing balance, functional mobility training, activity tolerance, pt/family education   Communication   supervision   mod I   continue to address education and carryover of intelligibility    Safety/Cognition/ Behavioral Observations no unsaft behavior  min assist  monitor q shift   Pain   no complaints of pain  pain less than or equal to 2  monitor q shift   Skin   bruising to left hip and abdomen  remain freem from skin breakdown  this admission  assess skin q shift and prn     Rehab Goals Patient on target to meet rehab goals: Yes Rehab Goals Revised: none *See Care Plan and progress notes for long and short-term goals.   Barriers to Discharge: Safety, dysphagia, transfers, mobility, confidence   Possible Resolutions to Barriers:  Therapies   Discharge Planning/Teaching Needs:  Pt was planning to go home with her husband to provide supervision/min assist care, but she does not feel prepared to go home and thinks she will be too much care for her husband.  Pt would like to explore SNF option prior to returning home with husband.  Husband and pt's children plan to come for family education next  week, unless pt goes to SNF.   Team Discussion:  Pt continues to be medically stable.  PT feels pt is doing well with her walking, but pt does not see that.  She does have trouble with transfers and they are mostly mod assist.  ST stated that pt is making progress with her goals and is only eating soft, solids because is it easier for pt to feed herself with her tremor.    Revisions to Treatment Plan:  none    Continued Need for Acute Rehabilitation Level of Care: The patient requires daily medical management by a physician with specialized training in physical medicine and rehabilitation for the following conditions: Daily direction of a multidisciplinary physical rehabilitation program to ensure safe treatment while eliciting the highest outcome that is of practical value to the patient.: Yes Daily medical management of patient stability for increased activity during participation in an intensive rehabilitation regime.: Yes Daily analysis of laboratory values and/or radiology reports with any subsequent need for medication adjustment of medical intervention for : Neurological problems   Carnesha Maravilla, Silvestre Mesi 02/19/2016, 1:23 PM

## 2016-02-19 NOTE — Progress Notes (Signed)
[facility]IC[facility]ILITATION     PROGRESS NOTE  Subjective/Complaints:  Pt laying in bed this AM.  She is worried about going home next week.  ROS:  Denies CP, SOB, N/V/D  Objective: Vital Signs: Blood pressure (!) 102/49, pulse 65, temperature 98.3 F (36.8 C), temperature source Oral, resp. rate 16, weight 49.4 kg (108 lb 14.4 oz), SpO2 97 %. No results found. No results for input(s): WBC, HGB, HCT, PLT in the last 72 hours. No results for input(s): NA, K, CL, GLUCOSE, BUN, CREATININE, CALCIUM in the last 72 hours.  Invalid input(s): CO CBG (last 3)  No results for input(s): GLUCAP in the last 72 hours.  Wt Readings from Last 3 Encounters:  02/05/16 49.4 kg (108 lb 14.4 oz)  02/01/16 52.2 kg (115 lb)  02/20/13 56.7 kg (125 lb)    Physical Exam:  BP (!) 102/49 (BP Location: Left Arm)   Pulse 65   Temp 98.3 F (36.8 C) (Oral)   Resp 16   Wt 49.4 kg (108 lb 14.4 oz)   SpO2 97%   BMI 21.27 kg/m  Constitutional: She appears well-developed. Frail HENT: Normocephalicand atraumatic.  Eyes: EOMand Conj are normal.  Cardiovascular: Normal rateand regular rhythm.  Respiratory: Effort normaland breath sounds normal. No respiratory distress.  GI: Soft. Bowel sounds are normal. She exhibits no distension.  Musculoskeletal: She exhibits no edemaor tenderness.  Neurological: She is alert and oriented.  Moderate dysarthria.  Follows commands.  Good Awareness of deficits Right facial droop +Resting tremor Motor: RUE: shoulder abduction, elbow flexion/extension 4-/5, wrist 3-/5, hand 2/5 RLE: 4+/5 hip flexion, knee extension, 4+/5 ankle dorsi/plantar flexion LUE/LLE: 5/5 proximal to distal Psychiatric: She has a normal mood and affect. Her behavior is normal Skin: Skin is warmand dry   Assessment/Plan: 1. 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 Physiatrist is providing close team supervision and 24 hour management of active medical problems listed below. Physiatrist and rehab team continue to assess barriers to discharge/monitor patient progress toward functional and medical goals.  Function:  Bathing Bathing position Bathing activity did not occur: Refused Position: Wheelchair/chair at sink  Bathing parts Body parts bathed by patient: Right upper leg, Left upper leg, Right lower leg, Left lower leg, Front perineal area, Abdomen, Chest Body parts bathed by helper: Left arm, Right arm, Buttocks, Back  Bathing assist Assist Level: Touching or steadying assistance(Pt > 75%)      Upper Body Dressing/Undressing Upper body dressing   What is the patient wearing?: Pull over shirt/dress Bra - Perfomed by patient: Thread/unthread right bra strap, Thread/unthread left bra strap Bra - Perfomed by helper: Hook/unhook bra (pull down sports bra) Pull over shirt/dress - Perfomed by patient: Thread/unthread right sleeve, Thread/unthread left sleeve, Put head through opening Pull over shirt/dress - Perfomed by helper: Pull shirt over trunk        Upper body assist Assist Level: Touching or steadying assistance(Pt > 75%)      Lower Body Dressing/Undressing Lower body dressing   What is the patient wearing?: Pants, Shoes, AFO Underwear - Performed by patient: Thread/unthread left underwear leg Underwear - Performed by helper: Pull underwear up/down, Thread/unthread right underwear leg Pants- Performed by patient: Thread/unthread left pants leg Pants- Performed by helper: Thread/unthread right pants leg, Pull pants up/down   Non-skid slipper socks- Performed by helper: Don/doff right  sock, Don/doff left sock Socks - Performed by patient: Don/doff left sock (doffed left sock) Socks - Performed by helper: Don/doff right sock, Don/doff left sock   Shoes - Performed by  helper: Don/doff right shoe, Don/doff left shoe, Fasten right, Fasten left   AFO - Performed by helper: Don/doff right AFO   TED Hose - Performed by helper: Don/doff right TED hose, Don/doff left TED hose  Lower body assist Assist for lower body dressing: Touching or steadying assistance (Pt > 75%) (min assist in standing with RW to pull up pants)      Toileting Toileting   Toileting steps completed by patient: Performs perineal hygiene Toileting steps completed by helper: Adjust clothing prior to toileting, Adjust clothing after toileting Toileting Assistive Devices: Grab bar or rail  Toileting assist Assist level: Touching or steadying assistance (Pt.75%)   Transfers Chair/bed transfer   Chair/bed transfer method: Ambulatory Chair/bed transfer assist level: Touching or steadying assistance (Pt > 75%) Chair/bed transfer assistive device: Armrests, Patent attorneyWalker     Locomotion Ambulation     Max distance: 120 ft Assist level: Touching or steadying assistance (Pt > 75%)   Wheelchair   Type: Manual Max wheelchair distance: 12860ft  Assist Level: Supervision or verbal cues  Cognition Comprehension Comprehension assist level: Follows basic conversation/direction with extra time/assistive device  Expression Expression assist level: Expresses basic 90% of the time/requires cueing < 10% of the time.  Social Interaction Social Interaction assist level: Interacts appropriately with others with medication or extra time (anti-anxiety, antidepressant).  Problem Solving Problem solving assist level: Solves basic problems with no assist  Memory Memory assist level: Recognizes or recalls 90% of the time/requires cueing < 10% of the time     Medical Problem List and Plan: 1.  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  DVT  Prophylaxis/Anticoagulation: Subcutaneous heparin. Monitor platelet counts and any signs of bleeding 3. Pain Management: Tylenol as needed 4. Dysphagia. Dysphagia #3 nectar liquids, advanced to D3 thin  Cont to advance as tolerated  Monitor hydration. Follow-up speech therapy 5. Neuropsych: This patient is capable of making decisions on her own behalf.  Come depressive symptoms - appreciate Neuropsych 6. Skin/Wound Care: Routine skin checks 7. Fluids/Electrolytes/Nutrition: Routine I&O   Dietary consult  BMP WNL on 9/21 8. Blood pressure: Currently WNL, will cont to monitor 9. Essential tremor. Patient maintained on Topamax per family 10. Hyperlipidemia. Lipitor 11. Overactive bladder. Ditropan 5 mg daily at bedtime.  12. Leukocytosis: Resolved 13. Prediabetes:    Glucose WNL 14. Diastolic dysfunction: Monitor for fluid overload 15. ABLA  Hb 11.9 on 9/21  Cont to monitor 16. Constipation: Improved  Daily bowel increased 9/25, again on 9/26  Will cont to monitor  LOS (Days) 16 A FACE TO FACE EVALUATION WAS PERFORMED  Ankit Karis Jubanil Patel 02/19/2016 8:17 AM

## 2016-02-19 NOTE — Progress Notes (Signed)
Speech Language Pathology Daily Session Note  Patient Details  Name: Dan HumphreysBennie H Yarbrough MRN: [id number] Date of Birth: 1927/12/18  Today's Date: 02/19/2016 SLP Individual Time: 1430-1515 SLP Individual Time Calculation (min): 45 min   Short Term Goals: Week 3: SLP Short Term Goal 1 (Week 3): Pt will utilize speech intelligibility strategies at the conversational level with mod I 100% intelligibility.   Skilled Therapeutic Interventions:  Pt was seen for skilled ST targeting communication goals.  Of note, at the beginning of today's therapy session, therapist asked if pt was interested in trialing regular textures again and pt reports that she continues to prefer to remain on dys 3 solids.  Pt presented with questions regarding change in discharge plans to SNF.  SLP discussed recommendation for ongoing ST follow up at next level of care.   CSW also present during the beginning of today's therapy session to update pt and husband and was made aware of ST recommendations.   All questions were answered to pt's satisfaction by both SLP and CSW.  Therapist facilitated the session with a categorical naming task targeting use of intelligibility strategies.  Pt was intelligible during task with supervision cues for use of speech strategies in a moderately distracting environment.  Pt was returned to room and left in wheelchair with husband at bedside.  Continue per current plan of care.    Function:  Eating Eating              Cognition Comprehension Comprehension assist level: Follows complex conversation/direction with extra time/assistive device  Expression   Expression assist level: Expresses basic needs/ideas: With extra time/assistive device  Social Interaction Social Interaction assist level: Interacts appropriately with others with medication or extra time (anti-anxiety, antidepressant).  Problem Solving Problem solving assist level: Solves basic problems with no assist  Memory Memory assist  level: Recognizes or recalls 90% of the time/requires cueing < 10% of the time    Pain Pain Assessment Pain Assessment: No/denies pain  Therapy/Group: Individual Therapy  Kourtlyn Charlet, Melanee SpryNicole L 02/19/2016, 3:27 PM

## 2016-02-19 NOTE — Progress Notes (Signed)
Social Work Patient ID: Kelly [name], female   DOB: 1927/08/23, 82 y.o.   MRN: [id number]   CSW met with pt to update her on team conference.  Therapists kept pt's d/c date on 02-27-16, but pt feels she will not be independent enough to where her 68 y/o husband can care for her at home.  Pt prefers to go to a SNF from Rehab before going home with her husband.  Therapists can see the benefit of this for pt and MD is agreeable to this plan if pt prefers SNF prior to d/c home.  Pt plans to talk with her husband about her plan for short term SNF, but she wants CSW to proceed with SNF bed search.  CSW will remain available to answer any questions from husband and will keep pt updated on SNF search.

## 2016-02-19 NOTE — Progress Notes (Signed)
Physical Therapy Session Note  Patient Details  Name: Kelly [name] MRN: [id number] Date of Birth: November 03, 1927  Today's Date: 02/19/2016 PT Individual Time: 1100-1200 and 1550-1615 PT Individual Time Calculation (min): 60 min and 25 min   Short Term Goals: Week 3:  PT Short Term Goal 1 (Week 3): = LTGs due to anticipated LOS  Skilled Therapeutic Interventions/Progress Updates:    Treatment 1: Patient in wheelchair upon arrival for skilled co-treat with recreational therapist. Session focused on DC planning and plan for family training in preparation for DC home next week as patient expressing concerns regarding "needing to be able to walk to go home," wheelchair propulsion using BLE and LUE with supervision, RLE neuro re-ed and anterior weight shift over BOS and WB through BLE for sit > stand transfers including physioball roll outs and reaching tasks with B feet blocked to prevent feet from sliding out from under patient, keeping COG over BOS in static standing with max multimodal cues for forward gaze, upright posture, and bringing hips forward with min-max A overall, gait training using RW with R AFO and R hand orthosis x 120 ft with min guard progressed to intermittent close supervision with max A for turning to sit in wheelchair using RW due to heavy posterior lean, forward and backward ambulation with BUE support on recreational therapist's shoulders in front and PT assisting patient from behind with max-total A x 2 with verbal/tactile cues for upright posture and forward gaze, anterior weight shift, and manual facilitation for weight shifting to L to step with RLE. Doffed R AFO due to complaints of toe pain and added R heel wedge with no change in gait mechanics noted using RW with R hand orthosis. Patient left sitting in wheelchair with all needs within reach.   Treatment 2: Patient sitting in wheelchair upon arrival. Session focused on upright posture, postural control, standing balance,  RUE/RLE NMR, and activity tolerance with stair training up/down 8 (3") stairs using L rail with verbal cues for step-to pattern and min A overall, sit <> stand and stand pivot transfers with max A and verbal cues for upright posture and anterior weight shift, and NuStep using BUE/BLE with R hand attachment at level 1 x 10 min. Patient reporting feeling pleased about plan to transfer to SNF for further rehab prior to DC home. Patient left sitting in wheelchair with all needs within reach.   Therapy Documentation Precautions:  Precautions Precautions: Fall Precaution Comments: R hemiplegia, R hip bursitis Restrictions Weight Bearing Restrictions: No Pain: Pain Assessment Pain Assessment: No/denies pain   See Function Navigator for Current Functional Status.   Therapy/Group: Individual Therapy  Kerney ElbeVarner, Wasil Wolke A 02/19/2016, 12:29 PM

## 2016-02-19 NOTE — Patient Care Conference (Signed)
Inpatient RehabilitationTeam Conference and Plan of Care Update Date: 02/19/2016   Time: 1:00 PM    Patient Name: Kelly [name]      Medical Record Number: [id number]  Date of Birth: Aug 08, 1927 Sex: Female         Room/Bed: 4W10C/4W10C-01 Payor Info: Payor: MEDICARE / Plan: MEDICARE PART A AND B / Product Type: *No Product type* /    Admitting Diagnosis: CVA  Admit Date/Time:  02/03/2016  6:46 PM Admission Comments: No comment available   Primary Diagnosis:  Right hemiparesis ([place]) Principal Problem: Right hemiparesis [facility])  Patient Active Problem List   Diagnosis Date Noted  . Slow transit constipation   . Acute blood loss anemia   . Adjustment disorder with depressed mood   . Cerebrovascular accident (CVA) ([place]) 02/03/2016  . Dysarthria, post-stroke   . Dysphagia, post-stroke   . Leukocytosis   . Prediabetes   . Right hemiparesis ([place])   . Cerebral infarction due to unspecified mechanism   . Other secondary hypertension   . Overactive bladder   . Diastolic dysfunction   . Hyperlipidemia   . Essential hypertension   . Essential tremor   . Stroke (cerebrum) ([place]) 02/01/2016  . Facial droop due to stroke 02/01/2016  . Essential and other specified forms of tremor 02/20/2013  . Dysphonia 02/20/2013  . Trigeminal neuralgia 02/20/2013    Expected Discharge Date: Expected Discharge Date: 02/27/16 (vs. SNF)  Team Members Present: Physician leading conference: Dr. Delice Lesch Social Worker Present: Alfonse Alpers, LCSW Nurse Present: Other (comment) Verdis Frederickson, RN) PT Present: Carney Living, PT OT Present: Other (comment) Grayland Ormond Doe, OT) SLP Present: Windell Moulding, SLP PPS Coordinator present : Ileana Ladd, PT     Current Status/Progress Goal Weekly Team Focus  Medical   Right-sided weakness, dysarthria, dysphasia secondary to linear infarct within the posterior limb of the left internal capsule  Improve mobility, transfers, confidence  See above    Bowel/Bladder   continent bowel and bladder , LBM 02/18/16  min assist  monitor q shift   Swallow/Nutrition/ Hydration   dys 3, thin liquids with intermittent supervision per pt request   mod I  goal met, will trial regular textures if pt requests    ADL's   Mod A LB dressing, improved R UE shoulder activiation, Min/Mod A functional transfers  Min A overall  NMRe-ed of R UE, functional transfers, standing balance,    Mobility   min-mod A overall using RW with R hand orthosis and AFO  supervision-min A  R NMR, standing balance, functional mobility training, activity tolerance, pt/family education   Communication   supervision   mod I   continue to address education and carryover of intelligibility    Safety/Cognition/ Behavioral Observations  no unsaft behavior  min assist  monitor q shift   Pain   no complaints of pain  pain less than or equal to 2  monitor q shift   Skin   bruising to left hip and abdomen  remain freem from skin breakdown this admission  assess skin q shift and prn    Rehab Goals Patient on target to meet rehab goals: Yes Rehab Goals Revised: none *See Care Plan and progress notes for long and short-term goals.  Barriers to Discharge: Safety, dysphagia, transfers, mobility, confidence    Possible Resolutions to Barriers:  Therapies    Discharge Planning/Teaching Needs:  Pt was planning to go home with her husband to provide supervision/min assist care, but she does not  feel prepared to go home and thinks she will be too much care for her husband.  Pt would like to explore SNF option prior to returning home with husband.  Husband and pt's children plan to come for family education next week, unless pt goes to SNF.   Team Discussion:  Pt continues to be medically stable.  PT feels pt is doing well with her walking, but pt does not see that.  She does have trouble with transfers and they are mostly mod assist.  ST stated that pt is making progress with her  goals and is only eating soft, solids because is it easier for pt to feed herself with her tremor.    Revisions to Treatment Plan:  none   Continued Need for Acute Rehabilitation Level of Care: The patient requires daily medical management by a physician with specialized training in physical medicine and rehabilitation for the following conditions: Daily direction of a multidisciplinary physical rehabilitation program to ensure safe treatment while eliciting the highest outcome that is of practical value to the patient.: Yes Daily medical management of patient stability for increased activity during participation in an intensive rehabilitation regime.: Yes Daily analysis of laboratory values and/or radiology reports with any subsequent need for medication adjustment of medical intervention for : Neurological problems  Matthieu Loftus, Silvestre Mesi 02/19/2016, 1:23 PM

## 2016-02-20 ENCOUNTER — Inpatient Hospital Stay (HOSPITAL_COMMUNITY): Payer: Medicare Other | Admitting: Speech Pathology

## 2016-02-20 ENCOUNTER — Inpatient Hospital Stay (HOSPITAL_COMMUNITY): Payer: Medicare Other | Admitting: Physical Therapy

## 2016-02-20 ENCOUNTER — Inpatient Hospital Stay (HOSPITAL_COMMUNITY): Payer: Medicare Other | Admitting: Occupational Therapy

## 2016-02-20 MED ORDER — INFLUENZA VAC SPLIT QUAD 0.5 ML IM SUSY
0.5000 mL | PREFILLED_SYRINGE | INTRAMUSCULAR | Status: AC
Start: 2016-02-21 — End: 2016-02-21
  Administered 2016-02-21: 0.5 mL via INTRAMUSCULAR
  Filled 2016-02-20: qty 0.5

## 2016-02-20 NOTE — Progress Notes (Signed)
Nutrition Follow-up  INTERVENTION:  Continue Magic cup TID with meals, each supplement provides 290 kcal and 9 grams of protein   NUTRITION DIAGNOSIS:   Swallowing difficulty related to dysphagia as evidenced by per patient/family report (SLP evaluation).  ongoing  GOAL:   Patient will meet greater than or equal to 90% of their needs  Being met  MONITOR:   PO intake, Supplement acceptance, Weight trends, I & O's  REASON FOR ASSESSMENT:   Consult Poor PO  ASSESSMENT:   82 y.o.female with PMH of osteoporosis, essential tremor, who presented 02/01/2016 with right side weakness as well as slurred speech. MRI of the brain showed acute nonhemorrhagic 1.4 cm linear infarct within the posterior limb of the left internal capsule. CTA of head and neck negative. Patient did not receive TPA.  Dysphagia #3 nectar thick liquid diet. Patient was admitted for a comprehensive rehabilitation program  Pt out of room at time of visit. Per nursing notes, pt continues to eat 50-75% of most meals. Per SLP note, pt was offered trial of regular textures and pt reported preferring to remain on dysphagia 3 diet.  No new weight since 9/13.   Labs reviewed. None new since 9/21.   Diet Order:  DIET DYS 3 Room service appropriate? Yes; Fluid consistency: Thin  Skin:  Reviewed, no issues  Last BM:  9/27  Height:   Ht Readings from Last 1 Encounters:  02/01/16 5' (1.524 m)    Weight:   Wt Readings from Last 1 Encounters:  02/05/16 108 lb 14.4 oz (49.4 kg)    Ideal Body Weight:  45.45 kg  BMI:  Body mass index is 21.27 kg/m.  Estimated Nutritional Needs:   Kcal:  1200-1400  Protein:  60-75 grams  Fluid:  1.2-1.4 L/day  EDUCATION NEEDS:   No education needs identified at this time  [place], CSP, LDN Inpatient Clinical Dietitian Pager: [phone] After Hours Pager: [phone]

## 2016-02-20 NOTE — Progress Notes (Signed)
Speech Language Pathology Discharge Summary  Patient Details  Name: Kelly [name] MRN: [id number] Date of Birth: 1928/01/11     Skilled Therapeutic Interventions:  Offered ST services to pt this afternoon.  Pt politely declined therapy session as she had already received ST this morning.  Pt is at goal level and is discharging to SNF tomorrow.  Education is complete at this time.  As a result, pt missed 30 minutes of skilled ST.      Patient has met 4 of 4 long term goals.  Patient to discharge at overall Modified Independent level.  Reasons goals not met:     Clinical Impression/Discharge Summary:  Pt has made functional gains while inpatient and is discharging having met 4 out of 4 long term goals.  Pt's diet has been advanced to dys 3 textures and thin liquids which she is consuming with mod I use of swallowing precautions.  Pt prefers to remain on dys 3 textures due to essential tremor which makes it difficult for her to feed herself regular solids.  Pt is overall mod I for intelligibility in quiet environments but needs up to supervision level assist to achieve intelligibility in noisy environments due to decreased vocal intensity and imprecise articulation of consonants.  Pt and family education is complete at this time.  Pt is discharging to SNF where she would benefit from brief ST follow up to address dysarthria.    Care Partner:  Caregiver Able to Provide Assistance:  (SNF)  Type of Caregiver Assistance:  (SNF)  Recommendation:  Outpatient SLP  Rationale for SLP Follow Up: Maximize functional communication   Equipment: none recommended by SLP    Reasons for discharge: Discharged from hospital   Patient/Family Agrees with Progress Made and Goals Achieved: Yes   Function:  Eating Eating                 Cognition Comprehension Comprehension assist level: Follows complex conversation/direction with extra time/assistive device  Expression   Expression assist level:  Expresses complex 90% of the time/cues < 10% of the time  Social Interaction Social Interaction assist level: Interacts appropriately with others with medication or extra time (anti-anxiety, antidepressant).  Problem Solving Problem solving assist level: Solves basic problems with no assist  Memory Memory assist level: Recognizes or recalls 90% of the time/requires cueing < 10% of the time   Emilio Math 02/20/2016, 4:17 PM

## 2016-02-20 NOTE — Progress Notes (Signed)
Physical Therapy Session Note  Patient Details  Name: Kelly HumphreysBennie H Sulewski MRN: [id number] Date of Birth: 23-Oct-1927  Today's Date: 02/20/2016 PT Individual Time: 1300-1415 PT Individual Time Calculation (min): 75 min    Short Term Goals: Week 3:  PT Short Term Goal 1 (Week 3): = LTGs due to anticipated LOS  Skilled Therapeutic Interventions/Progress Updates:    Patient seen for family training with husband for simulated car transfer to station wagon seat height via stand pivot with mod A overall, initial demonstration by therapist with return demonstration with husband assisting patient safely, wheelchair propulsion using BLE and LUE with supervision, functional ambulation using RW with R hand orthosis up/down ramp and up to 150 ft with min A overall and verbal cues for upright posture, stand pivot transfers with and without RW, bed mobility on regular bed in ADL apartment with supervision and increased time, stair training up/down 12 stairs using L rail with min A overall and verbal/visual cues for sequencing and technique with 1 seated rest break, TUG x 2 trials using RW with R hand orthosis: 1 min 45 sec improved to 1 min 25 sec, and NuStep using BLE only at level 5 x 5 min for neuro re-ed and activity tolerance. Patient and husband with no further questions regarding discharge planned to SNF tomorrow.   Therapy Documentation Precautions:  Precautions Precautions: Fall Precaution Comments: R hemiplegia, R hip bursitis Restrictions Weight Bearing Restrictions: No Pain: Pain Assessment Pain Assessment: No/denies pain  See Function Navigator for Current Functional Status.   Therapy/Group: Individual Therapy  Kerney ElbeVarner, Royale Swamy A 02/20/2016, 3:09 PM

## 2016-02-20 NOTE — Progress Notes (Signed)
Physical Therapy Discharge Summary  Patient Details  Name: Kelly [name] MRN: [id number] Date of Birth: 1928-05-15  Patient has met 6 of 11 long term goals due to improved activity tolerance, improved balance, improved postural control, increased strength, decreased pain, ability to compensate for deficits, functional use of  right upper extremity and right lower extremity, improved attention, improved awareness and improved coordination.  Patient to discharge at a wheelchair level min  Assist to Max Assist. Patient's care partner unavailable to provide the necessary physical assistance at discharge, therefore DC planned to SNF for further rehabilitation.  Reasons goals not met: Patient continues to require up to max A for functional mobility tasks due to strong posterior lean and decreased postural control especially during transitional movements.   Recommendation:  Patient will benefit from ongoing skilled PT services in skilled nursing facility setting to continue to advance safe functional mobility, address ongoing impairments in postural control, R hemiplegia, standing balance, activity tolerance, strength, and minimize fall risk.  Equipment: No equipment provided  Reasons for discharge: treatment goals met and discharge from hospital  Patient/family agrees with progress made and goals achieved: Yes  PT Discharge Precautions/Restrictions Precautions Precautions: Fall Restrictions Weight Bearing Restrictions: No Pain Pain Assessment Pain Assessment: No/denies pain Vision/Perception   Defer to OT discharge summary  Cognition Overall Cognitive Status: Within Functional Limits for tasks assessed Sensation Sensation Light Touch: Appears Intact Hot/Cold: Appears Intact Proprioception: Appears Intact Coordination Gross Motor Movements are Fluid and Coordinated: No Fine Motor Movements are Fluid and Coordinated: No Coordination and Movement Description: R hemiplegia Motor   Motor Motor: Hemiplegia  Mobility Bed Mobility Bed Mobility: Supine to Sit;Sit to Supine Supine to Sit: 5: Supervision;HOB flat Sit to Supine: 5: Supervision;HOB flat Transfers Transfers: Yes Stand Pivot Transfers: 2: Max assist;With armrests Locomotion  Ambulation Ambulation: Yes Ambulation/Gait Assistance: 4: Min assist Ambulation Distance (Feet): 150 Feet Assistive device: Rolling walker;Other (Comment) (R hand orthosis) Gait Gait: Yes Gait Pattern: Impaired Gait Pattern: Decreased weight shift to left;Decreased stance time - right;Poor foot clearance - right;Step-through pattern;Decreased step length - right;Decreased dorsiflexion - right;Right genu recurvatum;Lateral hip instability;Lateral trunk lean to right;Decreased trunk rotation;Trunk flexed Gait velocity: decreased Stairs / Additional Locomotion Stairs: Yes Stairs Assistance: 4: Min assist Stair Management Technique: One rail Left Number of Stairs: 12 Height of Stairs: 2 (8 3", 4 6") Wheelchair Mobility Wheelchair Mobility: Yes Wheelchair Assistance: 5: Investment banker, operational: Both lower extermities;Left upper extremity Wheelchair Parts Management: Needs assistance Distance: 150 ft  Trunk/Postural Assessment  Cervical Assessment Cervical Assessment: Exceptions to [facility] (Flexed) Thoracic Assessment Thoracic Assessment: Exceptions to [facility] (Kyphotic) Lumbar Assessment Lumbar Assessment: Exceptions to [facility] (Posterior pelvic tilt) Postural Control Postural Control: Deficits on evaluation Protective Responses: absent/impaired  Balance Balance Balance Assessed: Yes Standardized Balance Assessment Standardized Balance Assessment: Timed Up and Go Test Timed Up and Go Test TUG: Normal TUG Normal TUG (seconds): 95 (avg of 2 trials using RW) Static Standing Balance Static Standing - Balance Support: Bilateral upper extremity supported Static Standing - Level of Assistance: 4: Min assist Dynamic Standing  Balance Dynamic Standing - Balance Support: During functional activity;Bilateral upper extremity supported Dynamic Standing - Level of Assistance: 2: Max assist Extremity Assessment  RLE Assessment RLE Assessment: Exceptions to [facility] RLE Strength RLE Overall Strength: Deficits RLE Overall Strength Comments: grossly 3- to 4/5 throughout LLE Assessment LLE Assessment: Within Functional Limits LLE Strength LLE Overall Strength Comments: grossly 4/5 throughout   See Function Navigator for Current Functional Status.  Bayard Hugger  A 02/20/2016, 4:52 PM

## 2016-02-20 NOTE — Progress Notes (Signed)
Social Work Patient ID: [name], female   DOB: [birth year], 82 y.o.   MRN: [id number]   [facility] and will transfer there tomorrow.  Pt and her husband were pleased, though they are sad to leave [facility].  CSW will assist pt's transfer.

## 2016-02-20 NOTE — Discharge Summary (Signed)
Kelly [name], PHA                ACCOUNT NO.:  [id number]  MEDICAL RECORD NO.:  [id number]  LOCATION:  4W10C                        FACILITY:  [facility]  PHYSICIAN:  Kelly Morrow, MD        DATE OF BIRTH:  06/30/1927  DATE OF ADMISSION:  02/03/2016 DATE OF DISCHARGE:  02/21/2016                              DISCHARGE SUMMARY   DISCHARGE DIAGNOSES: 1. Linear infarct within the posterior limb of left internal capsule. 2. Subcutaneous heparin for DVT prophylaxis. 3. Dysphagia. 4. Hypertension. 5. Essential tremor. 6. Hyperlipidemia. 7. Overactive bladder. 8. Diastolic congestive heart failure.  HISTORY OF PRESENT ILLNESS:  This is an 82 year old right-handed female with history of essential tremor.  Lives with spouse at two-level home, used a walker prior to admission.  Presented on February 01, 2016, with right-sided weakness and slurred speech.  MRI of the brain showed acute nonhemorrhagic 1.4 cm linear infarct within the posterior limb of the left internal capsule.  CTA of head and neck negative.  The patient did not receive tPA.  Neurology consulted, placed on aspirin therapy.  She was on a mechanical soft nectar thick liquid diet.  Subcutaneous heparin for DVT prophylaxis.  Echocardiogram with no wall motion abnormalities, grade 1 diastolic dysfunction.  Physical and occupational therapy ongoing.  The patient was admitted for comprehensive rehab program.  PAST MEDICAL HISTORY:  See discharge diagnoses.  SOCIAL HISTORY:  Lives with spouse.  Used a walker prior to admission.  FUNCTIONAL STATUS:  Upon admission to rehab services was max assist to ambulate 1 foot, handheld assistance, moderate assist stand pivot transfers, min to mod assist activities of daily living.  PHYSICAL EXAMINATION:  VITAL SIGNS:  Blood pressure 102/57, pulse 83, temperature 98, respirations 18. GENERAL:  This was an alert female, moderate dysarthria, followed simple commands.  Fair awareness of  deficits.  Right facial droop, noted resting tremor. LUNGS:  Clear to auscultation.  No wheeze. CARDIAC:  Regular rate and rhythm.  No murmur. ABDOMEN:  Soft, nontender.  Good bowel sounds.  REHABILITATION HOSPITAL COURSE:  The patient was admitted to inpatient rehab services.  The following issues were addressed during the patient's rehab stay.  Pertaining to Kelly [name]' linear infarct posterior limb left internal capsule remained stable, maintained on aspirin therapy.  She would follow up with Neurology Services.  Subcutaneous heparin for DVT prophylaxis.  No bleeding episodes.  Her diet was advanced to mechanical soft thin liquids.  No signs of aspiration. Blood pressure is well controlled on no current antihypertensive medication.  She remained on Topamax for history of essential tremor. Noted overactive bladder, doing well with the addition of Ditropan at bedtime.  Bouts of constipation resolved with laxative assistance.  The patient received weekly collaborative interdisciplinary team conferences to discuss estimated length of stay, family teaching, any barriers to her discharge.  Ambulating 120 feet minimal guard, progressing to intermittent close supervision with max assist for turning, to sit in her wheelchair, using a rolling walker.  She was fitted with a right AFO.  Minimal assist overall sit to stand.  She had some simple set up for activities of daily living, home making.  Able to complete pivot transfers,  overall moderate assist for bathing, dressing, grooming.  She was able to communicate her needs 100% intelligibility.  Tolerating advancement in diet.  It was the patient's and family's interaction, they felt skilled nursing facility was needed with bed becoming available February 21, 2016.  DISCHARGE MEDICATIONS: 1. Aspirin 325 mg p.o. daily. 2. Lipitor 40 mg p.o. daily. 3. Prozac 20 mg p.o. daily. 4. Ditropan 5 mg p.o. at bedtime. 5. Protonix 40 mg p.o. daily. 6.  MiraLAX 17 g p.o. daily, hold for loose stool. 7. Topamax 25 mg p.o. b.i.d. 8. Tylenol as needed.  DIET:  Mechanical soft thin liquids.  SPECIAL INSTRUCTIONS:  The patient should receive followup consultation with Dr. Pamelia HoitKirchmayer, Physical Medicine and Rehab.  She should follow up outpatient rehab office with Dr. Allena KatzPatel as advised.     Kelly [name], P.A.   ______________________________ Kelly MorrowAnkit Patel, MD    DA/MEDQ  D:  02/20/2016  T:  02/20/2016  Job:  [id number]  cc:   Kelly MorrowAnkit Patel, MD Dr. Ron AgeeXu Richard W. [name], M.D.

## 2016-02-20 NOTE — Progress Notes (Addendum)
Occupational Therapy Session Note  Patient Details  Name: Kelly [name] MRN: [id number] Date of Birth: [birth year]  Today's Date: 02/20/2016 OT Individual Time: 0800-0900 OT Individual Time Calculation (min): 60 min   Skilled Therapeutic Interventions/Progress Updates:     1:1 OT session focused on sit<>stands, standing balance, postural control, and modified dressing techniques. Pt completed LB dressing with overall Mod A and Mod/Max A to maintain dynamic standing balance to pull pants over hips 2/2 severe R lateral and posterior lean.  UB dressing with overall Min A using hemi-techniques. Mod/Max A Sit<>stands with focus on anterior weight shifting. Pt required max A for squat-pivot transfer today 2/2 difficulty with transitional movement. Pt left seated in w/c at end of session with needs met.   Therapy Documentation Precautions:  Precautions Precautions: Fall Precaution Comments: R hemiplegia Restrictions Weight Bearing Restrictions: No Pain:  no pain   See Function Navigator for Current Functional Status.   Therapy/Group: Individual Therapy  [name] 02/20/2016, 7:17 PM

## 2016-02-20 NOTE — Progress Notes (Addendum)
Occupational Therapy Discharge Summary  Patient Details  Name: Kelly [name] MRN: [id number] Date of Birth: 1927-06-26 Today's Date: 02/20/2016   Patient has met 5 of 12 long term goals due to improved activity tolerance, improved balance, postural control, ability to compensate for deficits, functional use of  RIGHT upper extremity, improved awareness and improved coordination.  Patient to discharge at wheelchair level overall Mod Assist level.  Patient's care partner unavailable to provide the necessary physical assistance at discharge, therefore plan for d/c to SNF for continue OT services.  Reasons goals not met: Pt continues to require mod A for functional transfers, Mod/min A for bathing/dressing, and Mod A to maintain dynamic standing balance during functional ADL tasks 2/2 posterior and lateral lean to R.  Recommendation:  Patient will benefit from ongoing skilled OT services in skilled nursing facility setting to continue to advance functional skills in bathing, dressing, toileting, functional transfers, R hemiplegia, postural control, standing balance, and functional use of R Ue.   Equipment: No equipment provided  Reasons for discharge: discharge from hospital  Patient/family agrees with progress made and goals achieved: Yes  OT Discharge Precautions/Restrictions Precautions Precautions: Fall Precaution Comments: R hemiplegia Restrictions Weight Bearing Restrictions: No Pain Pain Assessment Pain Assessment: No/denies pain ADL See functional navigator Cognition Overall Cognitive Status: Within Functional Limits for tasks assessed Arousal/Alertness: Awake/alert Orientation Level: Oriented X4 Sensation Sensation Light Touch: Appears Intact Hot/Cold: Appears Intact Proprioception: Appears Intact Coordination Gross Motor Movements are Fluid and Coordinated: No Fine Motor Movements are Fluid and Coordinated: No Coordination and Movement Description: R hemiplegia Motor   Motor Motor: Hemiplegia Balance Dynamic Standing Balance Dynamic Standing - Balance Support: During functional activity;Bilateral upper extremity supported Dynamic Standing - Level of Assistance: 3: Mod assist Extremity/Trunk Assessment RUE Assessment RUE Assessment: Exceptions to [facility] RUE AROM (degrees) Overall AROM Right Upper Extremity: Deficits RUE Overall AROM Comments: increased shoulder flex/ext/aBd/ADd, pronation/supination in gravity eliminated position LUE Assessment LUE Assessment: Within Functional Limits   See Function Navigator for Current Functional Status.  Kelly [name] 02/20/2016, 5:39 PM

## 2016-02-20 NOTE — NC FL2 (Signed)
[place] MEDICAID FL2 LEVEL OF CARE SCREENING TOOL     IDENTIFICATION  Patient Name: Kelly [name] Birthdate: [birth year] Sex: female Admission Date (Current Location): 02/03/2016  [facility] [place]   Fac[facility] and Address[facility]  [facility], [address], [place], [place] [address]      Provider Number: [id number]  Attending Physician Name and Address:  Claudette LawsAndrew Kirsteins, MD  Relative Name and Phone Number:       Current Level of Care: Other (Comment) (Inpatient Rehab) Recommended Level of Care: Skilled Nursing Facility Prior Approval Number:    Date Approved/Denied:   PASRR Number: [id number]  Discharge Plan: SNF    Current Diagnoses: Patient Active Problem List   Diagnosis Date Noted  . Slow transit constipation   . Acute blood loss anemia   . Adjustment disorder with depressed mood   . Cerebrovascular accident (CVA) (HCC) 02/03/2016  . Dysarthria, post-stroke   . Dysphagia, post-stroke   . Leukocytosis   . Prediabetes   . Right hemiparesis (HCC)   . Cerebral infarction due to unspecified mechanism   . Other secondary hypertension   . Overactive bladder   . Diastolic dysfunction   . Hyperlipidemia   . Essential hypertension   . Essential tremor   . Stroke (cerebrum) (HCC) 02/01/2016  . Facial droop due to stroke 02/01/2016  . Essential and other specified forms of tremor 02/20/2013  . Dysphonia 02/20/2013  . Trigeminal neuralgia 02/20/2013    Orientation RESPIRATION BLADDER Height & Weight     Self, Time, Situation, Place  Normal Continent Weight: 49.4 kg (108 lb 14.4 oz) Height:     BEHAVIORAL SYMPTOMS/MOOD NEUROLOGICAL BOWEL NUTRITION STATUS      Continent Diet (dysphagia 3 with thin liquids)  AMBULATORY STATUS COMMUNICATION OF NEEDS Skin   Limited Assist Verbally Bruising (left hip and abdomen)                       Personal Care Assistance Level of Assistance  Bathing, Feeding, Dressing Bathing  Assistance: Limited assistance Feeding assistance: Limited assistance Dressing Assistance: Limited assistance     Functional Limitations Info  Speech     Speech Info: Impaired (but intelligible)    SPECIAL CARE FACTORS FREQUENCY  PT (By licensed PT), OT (By licensed OT), Speech therapy     PT Frequency: 5x/week OT Frequency: 5x/week     Speech Therapy Frequency: 5x/week      Contractures Contractures Info: Not present    Additional Factors Info  Code Status Code Status Info: full             Current Medications (02/20/2016):  This is the current hospital active medication list Current Facility-Administered Medications  Medication Dose Route Frequency Provider Last Rate Last Dose  . acetaminophen (TYLENOL) tablet 650 mg  650 mg Oral Q4H PRN Mcarthur Rossettianiel J Angiulli, PA-C       Or  . acetaminophen (TYLENOL) suppository 650 mg  650 mg Rectal Q4H PRN Mcarthur Rossettianiel J Angiulli, PA-C      . aspirin suppository 300 mg  300 mg Rectal Daily Daniel J Angiulli, PA-C       Or  . aspirin tablet 325 mg  325 mg Oral Daily Mcarthur RossettiDaniel J Angiulli, PA-C   325 mg at 02/20/16 0801  . atorvastatin (LIPITOR) tablet 40 mg  40 mg Oral q1800 Mcarthur Rossettianiel J Angiulli, PA-C   40 mg at 02/19/16 1756  . FLUoxetine (PROZAC) capsule 20 mg  20 mg Oral Daily [name], MD   20 mg at 02/20/16 0801  . heparin injection 5,000 Units  5,000 Units Subcutaneous Q8H Mcarthur Rossetti Angiulli, PA-C   5,000 Units at 02/19/16 2240  . ondansetron (ZOFRAN) tablet 4 mg  4 mg Oral Q6H PRN Mcarthur Rossetti Angiulli, PA-C       Or  . ondansetron (ZOFRAN) injection 4 mg  4 mg Intravenous Q6H PRN Mcarthur Rossetti Angiulli, PA-C      . oxybutynin (DITROPAN) tablet 5 mg  5 mg Oral QHS Daniel J Angiulli, PA-C   5 mg at 02/19/16 2240  . pantoprazole (PROTONIX) EC tablet 40 mg  40 mg Oral Daily Mcarthur Rossetti Angiulli, PA-C   40 mg at 02/20/16 0801  . polyethylene glycol (MIRALAX / GLYCOLAX) packet 17 g  17 g Oral Daily [name], MD   17 g at 02/19/16 0802  .  senna-docusate (Senokot-S) tablet 1 tablet  1 tablet Oral BID [name], MD   1 tablet at 02/19/16 2240  . sorbitol 70 % solution 30 mL  30 mL Oral Daily PRN Mcarthur Rossetti Angiulli, PA-C   30 mL at 02/17/16 0734  . topiramate (TOPAMAX) tablet 25 mg  25 mg Oral BID Mcarthur Rossetti Angiulli, PA-C   25 mg at 02/20/16 1610     Discharge Medications: Please see discharge summary for a list of discharge medications.  Relevant Imaging Results:  Relevant Lab Results:   Additional Information SSN:  [id number]  Kelly [name], [name], LCSW

## 2016-02-20 NOTE — Plan of Care (Signed)
Problem: RH Bed to Chair Transfers Goal: LTG Patient will perform bed/chair transfers w/assist (PT) LTG: Patient will perform bed/chair transfers with assistance, with/without cues (PT).  Outcome: Adequate for Discharge Patient requires min A to max A for transfers with cuing for technique due to strong posterior lean.   Problem: RH Car Transfers Goal: LTG Patient will perform car transfers with assist (PT) LTG: Patient will perform car transfers with assistance (PT).  Outcome: Adequate for Discharge Patient requires mod A for car transfers with cuing for technique due to strong posterior lean.   Problem: RH Furniture Transfers Goal: LTG Patient will perform furniture transfers w/assist (OT/PT LTG: Patient will perform furniture transfers  with assistance (OT/PT).  Patient requires min A to max A for furniture transfers with cuing for technique due to strong posterior lean.   Problem: RH Balance Goal: LTG Patient will maintain dynamic standing balance (PT) LTG:  Patient will maintain dynamic standing balance with assistance during mobility activities (PT)  Outcome: Adequate for Discharge Patient requires up to max A for dynamic standing balance due to strong posterior lean.

## 2016-02-20 NOTE — Progress Notes (Signed)
Speech Language Pathology Daily Session Note  Patient Details  Name: Kelly [name] MRN: [id number] Date of Birth: [birth year]  Today's Date: 02/20/2016 SLP Individual Time: 1030-1100 SLP Individual Time Calculation (min): 30 min   Short Term Goals: Week 3: SLP Short Term Goal 1 (Week 3): Pt will utilize speech intelligibility strategies at the conversational level with mod I 100% intelligibility.   Skilled Therapeutic Interventions:Pt seen for skilled SLP targeting speech production. Pt able to recall 2 of 3 strategies for improved intelligibility, and implemented strategies with occasional verbal cueing.   Function:  Eating Eating   Modified Consistency Diet: Yes Eating Assist Level: Set up assist for;Help managing cup/glass   Eating Set Up Assist For: Opening containers       Cognition Comprehension Comprehension assist level: Follows complex conversation/direction with extra time/assistive device  Expression   Expression assist level: Expresses complex 90% of the time/cues < 10% of the time  Social Interaction Social Interaction assist level: Interacts appropriately with others with medication or extra time (anti-anxiety, antidepressant).  Problem Solving Problem solving assist level: Solves basic problems with no assist  Memory Memory assist level: Recognizes or recalls 90% of the time/requires cueing < 10% of the time    Pain Pain Assessment Pain Assessment: No/denies pain  Therapy/Group: Individual Therapy  Rocky CraftsKara E Bricelyn Freestone MA, CCC-SLP 02/20/2016, 12:34 PM

## 2016-02-20 NOTE — Progress Notes (Signed)
[place] PHYSICAL MEDICINE & REHABILITATION     PROGRESS NOTE  Subjective/Complaints:  Patient asking about discharge to skilled facility Social work has informed me that they are now able to accept patient tomorrow  ROS:  Denies CP, SOB, N/V/D  Objective: Vital Signs: Blood pressure (!) 112/55, pulse 63, temperature 98 F (36.7 C), temperature source Oral, resp. rate 16, weight 49.4 kg (108 lb 14.4 oz), SpO2 97 %. No results found. No results for input(s): WBC, HGB, HCT, PLT in the last 72 hours. No results for input(s): NA, K, CL, GLUCOSE, BUN, CREATININE, CALCIUM in the last 72 hours.  Invalid input(s): CO CBG (last 3)  No results for input(s): GLUCAP in the last 72 hours.  Wt Readings from Last 3 Encounters:  02/05/16 49.4 kg (108 lb 14.4 oz)  02/01/16 52.2 kg (115 lb)  02/20/13 56.7 kg (125 lb)    Physical Exam:  BP (!) 112/55 (BP Location: Left Arm)   Pulse 63   Temp 98 F (36.7 C) (Oral)   Resp 16   Wt 49.4 kg (108 lb 14.4 oz)   SpO2 97%   BMI 21.27 kg/m  Constitutional: She appears well-developed. Frail HENT: Normocephalicand atraumatic.  Eyes: EOMand Conj are normal.  Cardiovascular: Normal rateand regular rhythm.  Respiratory: Effort normaland breath sounds normal. No respiratory distress.  GI: Soft. Bowel sounds are normal. She exhibits no distension.  Musculoskeletal: She exhibits no edemaor tenderness.  Neurological: She is alert and oriented.  Moderate dysarthria.  Follows commands.  Good Awareness of deficits Right facial droop +Resting tremor Motor: RUE: shoulder abduction, elbow flexion/extension 4-/5, wrist 3-/5, hand 2/5 RLE: 4+/5 hip flexion, knee extension, 4+/5 ankle dorsi/plantar flexion LUE/LLE: 5/5 proximal to distal Psychiatric: She has a normal mood and affect. Her behavior is normal Skin: Skin is warmand dry   Assessment/Plan: 1. 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 Physiatrist is providing close team supervision and 24 hour management of active medical problems listed below. Physiatrist and rehab team continue to assess barriers to discharge/monitor patient progress toward functional and medical goals.  Function:  Bathing Bathing position Bathing activity did not occur: Refused Position: Wheelchair/chair at sink  Bathing parts Body parts bathed by patient: Right upper leg, Left upper leg, Right lower leg, Left lower leg, Front perineal area, Abdomen, Chest Body parts bathed by helper: Left arm, Right arm, Buttocks, Back  Bathing assist Assist Level: Touching or steadying assistance(Pt > 75%)      Upper Body Dressing/Undressing Upper body dressing   What is the patient wearing?: Pull over shirt/dress, Bra Bra - Perfomed by patient: Thread/unthread right bra strap, Thread/unthread left bra strap Bra - Perfomed by helper: Hook/unhook bra (pull down sports bra) Pull over shirt/dress - Perfomed by patient: Thread/unthread left sleeve, Put head through opening, Pull shirt over trunk Pull over shirt/dress - Perfomed by helper: Thread/unthread right sleeve        Upper body assist Assist Level: Touching or steadying assistance(Pt > 75%)      Lower Body Dressing/Undressing Lower body dressing   What is the patient wearing?: Pants Underwear - Performed by patient: Thread/unthread left underwear leg Underwear - Performed by helper: Pull underwear up/down, Thread/unthread right underwear leg Pants- Performed by patient: Thread/unthread left pants leg, Thread/unthread right pants leg Pants- Performed by helper: Pull pants up/down   Non-skid slipper socks-  Performed by helper: Don/doff right sock, Don/doff left sock Socks - Performed by patient: Don/doff left sock (doffed left sock) Socks - Performed by helper: Don/doff right sock,  Don/doff left sock   Shoes - Performed by helper: Don/doff right shoe, Don/doff left shoe, Fasten right, Fasten left   AFO - Performed by helper: Don/doff right AFO   TED Hose - Performed by helper: Don/doff right TED hose, Don/doff left TED hose  Lower body assist Assist for lower body dressing: Touching or steadying assistance (Pt > 75%)      Toileting Toileting   Toileting steps completed by patient: Performs perineal hygiene Toileting steps completed by helper: Adjust clothing prior to toileting, Adjust clothing after toileting Toileting Assistive Devices: Grab bar or rail  Toileting assist Assist level: Touching or steadying assistance (Pt.75%)   Transfers Chair/bed transfer   Chair/bed transfer method: Ambulatory Chair/bed transfer assist level: Maximal assist (Pt 25 - 49%/lift and lower) Chair/bed transfer assistive device: Armrests, Patent attorneyWalker     Locomotion Ambulation     Max distance: 150 ft Assist level: Touching or steadying assistance (Pt > 75%)   Wheelchair   Type: Manual Max wheelchair distance: [id number]  Assist Level: Supervision or verbal cues  Cognition Comprehension Comprehension assist level: Follows complex conversation/direction with extra time/assistive device  Expression Expression assist level: Expresses complex 90% of the time/cues < 10% of the time  Social Interaction Social Interaction assist level: Interacts appropriately with others with medication or extra time (anti-anxiety, antidepressant).  Problem Solving Problem solving assist level: Solves basic problems with no assist  Memory Memory assist level: Recognizes or recalls 90% of the time/requires cueing < 10% of the time     Medical Problem List and Plan: 1.  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  Bracing ordered  Fluoxetine started 9/12, increased on 9/20 2.  DVT  Prophylaxis/Anticoagulation: Subcutaneous heparin. Monitor platelet counts and any signs of bleeding 3. Pain Management: Tylenol as needed 4. Dysphagia. Dysphagia #3 nectar liquids, advanced to D3 thin  Cont to advance as tolerated  Monitor hydration. Follow-up speech therapy 5. Neuropsych: This patient is capable of making decisions on her own behalf.  Come depressive symptoms - appreciate Neuropsych 6. Skin/Wound Care: Routine skin checks 7. Fluids/Electrolytes/Nutrition: Routine I&O   Dietary consult  BMP WNL on 9/21 8. Blood pressure: Currently WNL, will cont to monitor 9. Essential tremor. Patient maintained on Topamax per family 10. Hyperlipidemia. Lipitor 11. Overactive bladder. Ditropan 5 mg daily at bedtime.  12. Leukocytosis: Resolved 13. Prediabetes:    Glucose WNL 14. Diastolic dysfunction: Monitor for fluid overload 15. ABLA  Hb 11.9 on 9/21  Cont to monitor 16. Constipation: Improved  Daily bowel increased 9/25, again on 9/26  Will cont to monitor  LOS (Days) 17 A FACE TO FACE EVALUATION WAS PERFORMED  Erick ColaceKIRSTEINS,Fatina Sprankle E 02/20/2016 2:48 PM

## 2016-02-21 ENCOUNTER — Inpatient Hospital Stay (HOSPITAL_COMMUNITY): Payer: Medicare Other | Admitting: Physical Therapy

## 2016-02-21 ENCOUNTER — Inpatient Hospital Stay (HOSPITAL_COMMUNITY): Payer: Medicare Other | Admitting: Occupational Therapy

## 2016-02-21 DIAGNOSIS — F4321 Adjustment disorder with depressed mood: Secondary | ICD-10-CM | POA: Diagnosis present

## 2016-02-21 DIAGNOSIS — R1312 Dysphagia, oropharyngeal phase: Secondary | ICD-10-CM | POA: Diagnosis not present

## 2016-02-21 DIAGNOSIS — I519 Heart disease, unspecified: Secondary | ICD-10-CM | POA: Diagnosis not present

## 2016-02-21 DIAGNOSIS — K219 Gastro-esophageal reflux disease without esophagitis: Secondary | ICD-10-CM | POA: Diagnosis not present

## 2016-02-21 DIAGNOSIS — I110 Hypertensive heart disease with heart failure: Secondary | ICD-10-CM | POA: Diagnosis present

## 2016-02-21 DIAGNOSIS — E785 Hyperlipidemia, unspecified: Secondary | ICD-10-CM | POA: Diagnosis not present

## 2016-02-21 DIAGNOSIS — R2681 Unsteadiness on feet: Secondary | ICD-10-CM | POA: Diagnosis not present

## 2016-02-21 DIAGNOSIS — I69351 Hemiplegia and hemiparesis following cerebral infarction affecting right dominant side: Secondary | ICD-10-CM | POA: Diagnosis present

## 2016-02-21 DIAGNOSIS — R531 Weakness: Secondary | ICD-10-CM | POA: Diagnosis not present

## 2016-02-21 DIAGNOSIS — Z5189 Encounter for other specified aftercare: Secondary | ICD-10-CM | POA: Diagnosis not present

## 2016-02-21 DIAGNOSIS — I63311 Cerebral infarction due to thrombosis of right middle cerebral artery: Secondary | ICD-10-CM | POA: Diagnosis not present

## 2016-02-21 DIAGNOSIS — R488 Other symbolic dysfunctions: Secondary | ICD-10-CM | POA: Diagnosis not present

## 2016-02-21 DIAGNOSIS — R260 Ataxic gait: Secondary | ICD-10-CM | POA: Diagnosis not present

## 2016-02-21 DIAGNOSIS — F329 Major depressive disorder, single episode, unspecified: Secondary | ICD-10-CM | POA: Diagnosis not present

## 2016-02-21 DIAGNOSIS — M6281 Muscle weakness (generalized): Secondary | ICD-10-CM | POA: Diagnosis not present

## 2016-02-21 DIAGNOSIS — I5030 Unspecified diastolic (congestive) heart failure: Secondary | ICD-10-CM | POA: Diagnosis present

## 2016-02-21 DIAGNOSIS — Z9842 Cataract extraction status, left eye: Secondary | ICD-10-CM | POA: Diagnosis not present

## 2016-02-21 DIAGNOSIS — R1310 Dysphagia, unspecified: Secondary | ICD-10-CM | POA: Diagnosis not present

## 2016-02-21 DIAGNOSIS — K5900 Constipation, unspecified: Secondary | ICD-10-CM | POA: Diagnosis not present

## 2016-02-21 DIAGNOSIS — R278 Other lack of coordination: Secondary | ICD-10-CM | POA: Diagnosis not present

## 2016-02-21 DIAGNOSIS — R7303 Prediabetes: Secondary | ICD-10-CM | POA: Diagnosis present

## 2016-02-21 DIAGNOSIS — N3281 Overactive bladder: Secondary | ICD-10-CM | POA: Diagnosis not present

## 2016-02-21 DIAGNOSIS — D72829 Elevated white blood cell count, unspecified: Secondary | ICD-10-CM | POA: Diagnosis present

## 2016-02-21 DIAGNOSIS — Z7983 Long term (current) use of bisphosphonates: Secondary | ICD-10-CM | POA: Diagnosis not present

## 2016-02-21 DIAGNOSIS — I69051 Hemiplegia and hemiparesis following nontraumatic subarachnoid hemorrhage affecting right dominant side: Secondary | ICD-10-CM | POA: Diagnosis not present

## 2016-02-21 DIAGNOSIS — I69951 Hemiplegia and hemiparesis following unspecified cerebrovascular disease affecting right dominant side: Secondary | ICD-10-CM | POA: Diagnosis not present

## 2016-02-21 DIAGNOSIS — G250 Essential tremor: Secondary | ICD-10-CM | POA: Diagnosis not present

## 2016-02-21 DIAGNOSIS — Z9841 Cataract extraction status, right eye: Secondary | ICD-10-CM | POA: Diagnosis not present

## 2016-02-21 DIAGNOSIS — D62 Acute posthemorrhagic anemia: Secondary | ICD-10-CM | POA: Diagnosis present

## 2016-02-21 DIAGNOSIS — Z23 Encounter for immunization: Secondary | ICD-10-CM | POA: Diagnosis not present

## 2016-02-21 DIAGNOSIS — I69121 Dysphasia following nontraumatic intracerebral hemorrhage: Secondary | ICD-10-CM | POA: Diagnosis not present

## 2016-02-21 DIAGNOSIS — I639 Cerebral infarction, unspecified: Secondary | ICD-10-CM | POA: Diagnosis not present

## 2016-02-21 DIAGNOSIS — R471 Dysarthria and anarthria: Secondary | ICD-10-CM | POA: Diagnosis present

## 2016-02-21 DIAGNOSIS — R1319 Other dysphagia: Secondary | ICD-10-CM | POA: Diagnosis not present

## 2016-02-21 DIAGNOSIS — Z79899 Other long term (current) drug therapy: Secondary | ICD-10-CM | POA: Diagnosis not present

## 2016-02-21 DIAGNOSIS — K5901 Slow transit constipation: Secondary | ICD-10-CM | POA: Diagnosis not present

## 2016-02-21 DIAGNOSIS — I69322 Dysarthria following cerebral infarction: Secondary | ICD-10-CM | POA: Diagnosis not present

## 2016-02-21 DIAGNOSIS — R262 Difficulty in walking, not elsewhere classified: Secondary | ICD-10-CM | POA: Diagnosis not present

## 2016-02-21 MED ORDER — TOPIRAMATE 25 MG PO TABS: 25.0000 mg | ORAL_TABLET | Freq: Two times a day (BID) | ORAL | 0 refills | Status: DC

## 2016-02-21 NOTE — Progress Notes (Signed)
Report called to Kelly [name], Charity fundraiserN at  [facility]. [facility] and [facility]. All questions answered with verbal understanding.

## 2016-02-21 NOTE — Progress Notes (Signed)
Social Work Discharge Note  The overall goal for the admission was met for:   Discharge location: No - pt to SNF  Length of Stay: Yes - 18 days  Discharge activity level: No - but likely due to early d/c to SNF.  Pt's targeted d/c was 02-27-16.  W/C level min to max assist  Home/community participation: No  Services provided included: MD, RD, PT, OT, SLP, RN, Pharmacy, Neuropsych and SW  Financial Services: Medicare and Private Insurance: [employer]  Follow-up services arranged: Other: Pt to transfer to [facility] for SNF care, as pt needs ongoing skilled PT/OT/ST services to maximize her functioning.  Comments (or additional information): Pt wants to gain more function and be as independent as possible before going home to her 91 y/o husband.  She will go to [facility] for SNF care and then return home.  Pt is pleased with this plan, although she and husband are disappointed to leave the care and therapy pt has received at [facility].  Pt will go to SNF in private vehicle with the assistance of her husband and son.  Patient/Family verbalized understanding of follow-up arrangements: Yes  Individual responsible for coordination of the follow-up plan: pt and her husband/family  Confirmed correct DME delivered: Prevatt, [name] 02/21/2016    Prevatt, [name] 

## 2016-02-24 ENCOUNTER — Encounter: Payer: Self-pay | Admitting: Adult Health

## 2016-02-24 ENCOUNTER — Non-Acute Institutional Stay (SKILLED_NURSING_FACILITY): Payer: Medicare Other | Admitting: Adult Health

## 2016-02-24 DIAGNOSIS — K5901 Slow transit constipation: Secondary | ICD-10-CM

## 2016-02-24 DIAGNOSIS — G250 Essential tremor: Secondary | ICD-10-CM | POA: Diagnosis not present

## 2016-02-24 DIAGNOSIS — I63311 Cerebral infarction due to thrombosis of right middle cerebral artery: Secondary | ICD-10-CM | POA: Diagnosis not present

## 2016-02-24 DIAGNOSIS — I519 Heart disease, unspecified: Secondary | ICD-10-CM | POA: Diagnosis not present

## 2016-02-24 DIAGNOSIS — R1310 Dysphagia, unspecified: Secondary | ICD-10-CM

## 2016-02-24 DIAGNOSIS — I5189 Other ill-defined heart diseases: Secondary | ICD-10-CM

## 2016-02-24 DIAGNOSIS — F329 Major depressive disorder, single episode, unspecified: Secondary | ICD-10-CM | POA: Diagnosis not present

## 2016-02-24 DIAGNOSIS — E785 Hyperlipidemia, unspecified: Secondary | ICD-10-CM

## 2016-02-24 DIAGNOSIS — F32A Depression, unspecified: Secondary | ICD-10-CM

## 2016-02-24 DIAGNOSIS — R2681 Unsteadiness on feet: Secondary | ICD-10-CM

## 2016-02-24 DIAGNOSIS — N3281 Overactive bladder: Secondary | ICD-10-CM | POA: Diagnosis not present

## 2016-02-24 NOTE — Progress Notes (Signed)
Patient ID: Kelly [name], female   DOB: 02/13/1928, 82 y.o.   MRN: [id number]    DATE:  02/24/2016   MRN:  [id number]  BIRTHDAY: January 17, 1928  Facility:  Nursing Home Location:  [facility]  Nursing Home Room Number: 601-P  LEVEL OF CARE:  SNF (31)  Contact Information    Name Relation [place] Daughter [phone]     Grissett,[name] Spouse [phone]         Code Status History    Date Active Date Inactive Code Status Order ID Comments User Context   02/03/2016  6:52 PM 02/03/2016  6:52 PM Full Code [id number]  Charlton Amor, PA-C Inpatient   02/03/2016  6:52 PM 02/21/2016  5:35 PM Full Code [id number]  Charlton Amor, PA-C Inpatient   02/01/2016  5:21 PM 02/03/2016  6:46 PM Full Code [id number]  [name], PA-C ED    Advance Directive Documentation   Flowsheet Row Most Recent Value  Type of Advance Directive  Out of facility DNR (pink MOST or yellow form)  Pre-existing out of facility DNR order (yellow form or pink MOST form)  No data  "MOST" Form in Place?  No data       Chief Complaint  Patient presents with  . Hospitalization Follow-up    HISTORY OF PRESENT ILLNESS:  This is an 82 year old female who has been admitted to [facility] on 02/21/16 from [facility] with Linear infarct within the posterior limb of left internal capsule. She had right-sided weakness and slurred speech. MRI of the brain showed acute nonhemorrhagic 1.4 cm linear infarct within the posterior limb of the left internal capsule. CTA of head and neck negative. Neurology was consulted and was placed on ASA therapy.  She has been admitted for a short-term rehabilitation.   PAST MEDICAL HISTORY:  Past Medical History:  Diagnosis Date  . Acute blood loss anemia   . Adjustment disorder with depressed mood   . CVA (cerebral vascular accident) (HCC) 02/03/2016   Linear infarct within the posterior limb of left internal capsule  . Diastolic CHF  (HCC)   . Dysphagia   . Dysphonia 02/20/2013  . Essential and other specified forms of tremor 02/20/2013  . Essential tremor   . HLD (hyperlipidemia)   . HTN (hypertension)   . OAB (overactive bladder)   . Osteoporosis   . Patient receiving subcutaneous heparin    For DVT prophylaxis 9/17  . Prediabetes   . Psoriasis   . Slow transit constipation   . Stroke (cerebrum) (HCC) 02/01/2016  . Trigeminal neuralgia 02/20/2013     CURRENT MEDICATIONS: Reviewed  Patient's Medications  New Prescriptions   No medications on file  Previous Medications   ASPIRIN 325 MG TABLET    Take 1 tablet (325 mg total) by mouth daily.   ATORVASTATIN (LIPITOR) 40 MG TABLET    Take 1 tablet (40 mg total) by mouth daily at 6 PM.   FLUOXETINE (PROZAC) 20 MG TABLET    Take 20 mg by mouth daily.   OXYBUTYNIN (DITROPAN) 5 MG TABLET    Take 5 mg by mouth at bedtime.    PANTOPRAZOLE (PROTONIX) 40 MG TABLET    Take 40 mg by mouth daily.   POLYETHYLENE GLYCOL (MIRALAX / GLYCOLAX) PACKET    Take 17 g by mouth daily.   TOPIRAMATE (TOPAMAX) 25 MG TABLET    Take 1 tablet (25 mg total) by mouth 2 (two)  times daily.  Modified Medications   No medications on file  Discontinued Medications   ALENDRONATE (FOSAMAX) 70 MG TABLET    Take 70 mg by mouth once a week. Take with a full glass of water on an empty stomach.   ALPRAZOLAM (XANAX) 0.25 MG TABLET    Take 1 tablet (0.25 mg total) by mouth 3 (three) times daily as needed (tremor).   ESOMEPRAZOLE (NEXIUM) 40 MG CAPSULE    Take 40 mg by mouth daily at 12 noon.   IBUPROFEN (ADVIL,MOTRIN) 200 MG TABLET    Take 400 mg by mouth every 6 (six) hours as needed for moderate pain.      No Known Allergies   REVIEW OF SYSTEMS:  GENERAL: no change in appetite, no fatigue, no weight changes, no fever, chills or weakness EYES: Denies change in vision, dry eyes, eye pain, itching or discharge EARS: Denies change in hearing, ringing in ears, or earache NOSE: Denies nasal  congestion or epistaxis MOUTH and THROAT: Denies oral discomfort, gingival pain or bleeding, pain from teeth or hoarseness   RESPIRATORY: no cough, SOB, DOE, wheezing, hemoptysis CARDIAC: no chest pain, edema or palpitations GI: no abdominal pain, diarrhea, constipation, heart burn, nausea or vomiting GU: Denies dysuria, frequency, hematuria, incontinence, or discharge PSYCHIATRIC: Denies feeling of depression or anxiety. No report of hallucinations, insomnia, paranoia, or agitation   PHYSICAL EXAMINATION  GENERAL APPEARANCE: Well nourished. In no acute distress. Normal body habitus SKIN:  Skin is warm and dry.  HEAD: Normal in size and contour. No evidence of trauma EYES: Lids open and close normally. No blepharitis, entropion or ectropion. PERRL. Conjunctivae are clear and sclerae are white. Lenses are without opacity EARS: Pinnae are normal. Patient hears normal voice tunes of the examiner MOUTH and THROAT: Lips are without lesions. Oral mucosa is moist and without lesions. Tongue is normal in shape, size, and color and without lesions NECK: supple, trachea midline, no neck masses, no thyroid tenderness, no thyromegaly LYMPHATICS: no LAN in the neck, no supraclavicular LAN RESPIRATORY: breathing is even & unlabored, BS CTAB CARDIAC: RRR, no murmur,no extra heart sounds, no edema GI: abdomen soft, normal BS, no masses, no tenderness, no hepatomegaly, no splenomegaly EXTREMITIES:  Able to move LUE and BLE; RUE and RLE weakness NEUROLOGICAL: RUE weakness, slight right facial droop PSYCHIATRIC: Alert and oriented X 3. Affect and behavior are appropriate  LABS/RADIOLOGY: Labs reviewed: Basic Metabolic Panel:  Recent Labs  16/10/96 1418 02/01/16 1443 02/04/16 0713 02/13/16 0527  NA 138 141 141 139  K 4.1 4.2 4.0 3.9  CL 109 106 110 108  CO2 24  --  25 25  GLUCOSE 112* 111* 96 94  BUN 22* 24* 17 16  CREATININE 0.70 0.70 0.73 0.73  CALCIUM 9.0  --  9.0 8.9   Liver Function  Tests:  Recent Labs  02/01/16 1418 02/04/16 0713  AST 19 19  ALT 13* 14  ALKPHOS 73 74  BILITOT 0.3 0.5  PROT 6.5 6.5  ALBUMIN 3.3* 3.5   CBC:  Recent Labs  02/01/16 1418 02/01/16 1443 02/04/16 0713 02/13/16 0527  WBC 11.5*  --  8.4 8.7  NEUTROABS 8.5*  --  4.9 4.8  HGB 12.4 12.9 13.0 11.9*  HCT 39.0 38.0 41.0 37.4  MCV 90.1  --  90.5 91.0  PLT 285  --  295 221   Lipid Panel:  Recent Labs  02/02/16 0745  HDL 40*   CBG:  Recent Labs  02/05/16 1141  GLUCAP 164*      Ct Angio Head W Or Wo Contrast  Result Date: 02/02/2016 CLINICAL DATA:  Initial evaluation for acute stroke. EXAM: CT ANGIOGRAPHY HEAD AND NECK TECHNIQUE: Multidetector CT imaging of the head and neck was performed using the standard protocol during bolus administration of intravenous contrast. Multiplanar CT image reconstructions and MIPs were obtained to evaluate the vascular anatomy. Carotid stenosis measurements (when applicable) are obtained utilizing NASCET criteria, using the distal internal carotid diameter as the denominator. CONTRAST:  100 cc of Isovue 370. COMPARISON:  Prior MRI from earlier the same day. FINDINGS: CT HEAD Scalp soft tissues within normal limits. Globes and orbits unremarkable. Patient status post lens extraction bilaterally. Senescent calcifications noted. Visualized paranasal sinuses are clear. No mastoid effusion. Middle ear cavities are clear. Calvarium intact. Age-related cerebral atrophy with moderate chronic microvascular ischemic disease. Known acute lacunar infarct within the left internal capsule not well seen. No acute intracranial hemorrhage. No other acute large vessel territory infarct. No mass lesion, midline shift, or mass effect. No hydrocephalus. No extra-axial fluid collection. CTA NECK Aortic arch: Visualized aortic arch of normal caliber with normal branch pattern. No high-grade stenosis at the origin of the great vessels. Mild scattered plaque within the arch  itself. Visualized subclavian arteries widely patent. Right carotid system: Right common carotid artery tortuous proximally. Right common carotid artery patent to the bifurcation without stenosis. No significant atheromatous plaque about the right bifurcation. Right ICA widely patent distally to the skullbase without stenosis, dissection, or occlusion. Right external carotid artery is branches within normal limits. Left carotid system: Left common carotid artery widely patent to the bifurcation. Minimal centric plaque about the left bifurcation without stenosis. Left ICA widely patent from the bifurcation to the skullbase without stenosis, dissection, or occlusion. Vertebral arteries:Both vertebral arteries arise from the subclavian arteries. Pre foraminal left V1 segment tortuous with multifocal atheromatous irregularity with resultant mild multi focal narrowing. Vertebral arteries otherwise widely patent within the neck without stenosis, dissection, or occlusion. Skeleton: No acute osseous abnormality. No worrisome lytic or blastic osseous lesions. Advanced degenerative spondylolysis noted within cervical spine. Other neck: Visualized lungs are clear. Visualized mediastinum grossly normal. Thyroid within normal limits. No adenopathy within the neck. No acute soft tissue abnormality. CTA HEAD Anterior circulation: Petrous segments widely patent bilaterally. Scattered calcified plaque within the cavernous ICAs with mild diffuse narrowing. A1 segments patent. Anterior communicating artery normal. Anterior cerebral arteries well opacified to their distal aspects. M1 segments widely patent without stenosis or occlusion. MCA bifurcations normal. Focal short-segment severe stenosis proximal right M2 branch, middle division (series 15, image 63). Distal MCA branches well opacified and symmetric. Posterior circulation: Focal plaque at the right V4 segment as it crosses the dural margin with secondary minimal narrowing.  Right vertebral artery otherwise widely patent to the vertebrobasilar junction. Minimal plaque within the left V4 segment without stenosis. Posterior inferior cerebral arteries patent. Basilar artery fenestrated proximally. Basilar artery widely patent to its distal aspect. Superior cerebral arteries patent. Both of the posterior cerebral arteries arise from the basilar artery. Atheromatous irregularity with moderate smooth narrowing of the left P 2 segment. More mild atheromatous irregularity within the right PCA. Venous sinuses: Patent without evidence for venous sinus thrombosis. Anatomic variants: No significant anatomic variant. No aneurysm or vascular malformation. Small focal outpouching arising from the expected location of the takeoff of the left posterior communicating artery favored to reflect a small vascular infundibulum is associated with a hypoplastic left P com. Delayed phase:  No pathologic enhancement. IMPRESSION: CTA NECK IMPRESSION: 1. Negative CTA of the neck for patient age. No high-grade or critical stenosis. 2. Mild atheromatous plaque about the left carotid bifurcation without significant stenosis. 3. No significant atheromatous disease within the right carotid artery system. 4. Mild atheromatous plaque within the pre foraminal left V1 segment with resultant mild stenosis. Otherwise widely patent vertebral arteries within the neck. CTA HEAD IMPRESSION: 1. Negative for large vessel occlusion. No high-grade or correctable stenosis. 2. Short-segment severe nonocclusive stenosis proximal right M2 branch as above. 3. Calcified atheromatous plaque within the cavernous ICAs with mild diffuse narrowing. 4. Mild to moderate multifocal atheromatous irregularity within the PCAs bilaterally, left greater than right. Electronically Signed   By: Rise Mu M.D.   On: 02/02/2016 02:08   Dg Chest 2 View  Result Date: 02/01/2016 CLINICAL DATA:  Stroke-like symptoms. EXAM: CHEST  2 VIEW  COMPARISON:  None. FINDINGS: Cardiomediastinal silhouette is normal. Mediastinal contours appear intact. Torturous atherosclerotic aorta. There is no evidence of focal airspace consolidation, pleural effusion or pneumothorax. Osseous structures are without acute abnormality. Soft tissues are grossly normal. Breast implants are noted. IMPRESSION: No active cardiopulmonary disease. Electronically Signed   By: Ted Mcalpine M.D.   On: 02/01/2016 18:15   Ct Angio Neck W Or Wo Contrast  Result Date: 02/02/2016 CLINICAL DATA:  Initial evaluation for acute stroke. EXAM: CT ANGIOGRAPHY HEAD AND NECK TECHNIQUE: Multidetector CT imaging of the head and neck was performed using the standard protocol during bolus administration of intravenous contrast. Multiplanar CT image reconstructions and MIPs were obtained to evaluate the vascular anatomy. Carotid stenosis measurements (when applicable) are obtained utilizing NASCET criteria, using the distal internal carotid diameter as the denominator. CONTRAST:  100 cc of Isovue 370. COMPARISON:  Prior MRI from earlier the same day. FINDINGS: CT HEAD Scalp soft tissues within normal limits. Globes and orbits unremarkable. Patient status post lens extraction bilaterally. Senescent calcifications noted. Visualized paranasal sinuses are clear. No mastoid effusion. Middle ear cavities are clear. Calvarium intact. Age-related cerebral atrophy with moderate chronic microvascular ischemic disease. Known acute lacunar infarct within the left internal capsule not well seen. No acute intracranial hemorrhage. No other acute large vessel territory infarct. No mass lesion, midline shift, or mass effect. No hydrocephalus. No extra-axial fluid collection. CTA NECK Aortic arch: Visualized aortic arch of normal caliber with normal branch pattern. No high-grade stenosis at the origin of the great vessels. Mild scattered plaque within the arch itself. Visualized subclavian arteries widely  patent. Right carotid system: Right common carotid artery tortuous proximally. Right common carotid artery patent to the bifurcation without stenosis. No significant atheromatous plaque about the right bifurcation. Right ICA widely patent distally to the skullbase without stenosis, dissection, or occlusion. Right external carotid artery is branches within normal limits. Left carotid system: Left common carotid artery widely patent to the bifurcation. Minimal centric plaque about the left bifurcation without stenosis. Left ICA widely patent from the bifurcation to the skullbase without stenosis, dissection, or occlusion. Vertebral arteries:Both vertebral arteries arise from the subclavian arteries. Pre foraminal left V1 segment tortuous with multifocal atheromatous irregularity with resultant mild multi focal narrowing. Vertebral arteries otherwise widely patent within the neck without stenosis, dissection, or occlusion. Skeleton: No acute osseous abnormality. No worrisome lytic or blastic osseous lesions. Advanced degenerative spondylolysis noted within cervical spine. Other neck: Visualized lungs are clear. Visualized mediastinum grossly normal. Thyroid within normal limits. No adenopathy within the neck. No acute soft tissue abnormality. CTA HEAD Anterior  circulation: Petrous segments widely patent bilaterally. Scattered calcified plaque within the cavernous ICAs with mild diffuse narrowing. A1 segments patent. Anterior communicating artery normal. Anterior cerebral arteries well opacified to their distal aspects. M1 segments widely patent without stenosis or occlusion. MCA bifurcations normal. Focal short-segment severe stenosis proximal right M2 branch, middle division (series 15, image 63). Distal MCA branches well opacified and symmetric. Posterior circulation: Focal plaque at the right V4 segment as it crosses the dural margin with secondary minimal narrowing. Right vertebral artery otherwise widely patent to  the vertebrobasilar junction. Minimal plaque within the left V4 segment without stenosis. Posterior inferior cerebral arteries patent. Basilar artery fenestrated proximally. Basilar artery widely patent to its distal aspect. Superior cerebral arteries patent. Both of the posterior cerebral arteries arise from the basilar artery. Atheromatous irregularity with moderate smooth narrowing of the left P 2 segment. More mild atheromatous irregularity within the right PCA. Venous sinuses: Patent without evidence for venous sinus thrombosis. Anatomic variants: No significant anatomic variant. No aneurysm or vascular malformation. Small focal outpouching arising from the expected location of the takeoff of the left posterior communicating artery favored to reflect a small vascular infundibulum is associated with a hypoplastic left P com. Delayed phase: No pathologic enhancement. IMPRESSION: CTA NECK IMPRESSION: 1. Negative CTA of the neck for patient age. No high-grade or critical stenosis. 2. Mild atheromatous plaque about the left carotid bifurcation without significant stenosis. 3. No significant atheromatous disease within the right carotid artery system. 4. Mild atheromatous plaque within the pre foraminal left V1 segment with resultant mild stenosis. Otherwise widely patent vertebral arteries within the neck. CTA HEAD IMPRESSION: 1. Negative for large vessel occlusion. No high-grade or correctable stenosis. 2. Short-segment severe nonocclusive stenosis proximal right M2 branch as above. 3. Calcified atheromatous plaque within the cavernous ICAs with mild diffuse narrowing. 4. Mild to moderate multifocal atheromatous irregularity within the PCAs bilaterally, left greater than right. Electronically Signed   By: Rise Mu M.D.   On: 02/02/2016 02:08   Mr Brain Wo Contrast  Result Date: 02/01/2016 CLINICAL DATA:  Right-sided facial droop. Stroke like symptoms. Slurred speech. Symptoms began at 7:30 a.m. EXAM:  MRI HEAD WITHOUT CONTRAST TECHNIQUE: Multiplanar, multiecho pulse sequences of the brain and surrounding structures were obtained without intravenous contrast. COMPARISON:  CT head without contrast 03/07/2013 FINDINGS: Brain: Acute nonhemorrhagic linear infarct is present within the posterior limb of the left internal capsule measuring up to 14 mm. No other acute infarct is present. Extensive dilated perivascular spaces are present throughout the basal ganglia and thalami. Advanced atrophy and diffuse white matter disease is present bilaterally. The ventricles are proportionate to the degree of atrophy. White matter changes extend into the brainstem. The brainstem and cerebellum are otherwise unremarkable. Internal auditory canals are within normal limits. Vascular: Flow is present in the major intracranial arteries. Skull and upper cervical spine: The skullbase is within normal limits. Midline sagittal images demonstrate an infarct involving the anterior corpus callosum. Midline intracranial structures are otherwise within normal limits. Mild degenerative changes are present in the upper cervical spine, and consistent with a age. Sinuses/Orbits: The paranasal sinuses and mastoid air cells are clear. Bilateral lens replacements are present. The globes and orbits are intact. Other: IMPRESSION: 1. Acute nonhemorrhagic 1.4 cm linear infarct within the posterior limb of the left internal capsule. This corresponds with the patient's right-sided symptoms and a aphasia. 2. Advanced atrophy and diffuse white matter disease likely reflects the sequela of chronic microvascular ischemia without other acute  infarct. 3. Dilated perivascular spaces throughout the basal ganglia. These results were called by telephone at the time of interpretation on 02/01/2016 at 3:58 pm to Dr. Rubin PayorPICKERING, who verbally acknowledged these results. Electronically Signed   By: Marin Robertshristopher  Mattern M.D.   On: 02/01/2016 16:01     ASSESSMENT/PLAN:  Unsteady gait - for rehabilitation, PT and OT, for therapeutic strengthening exercises; fall precaution  CVA - for rehabilitation,  PT and OT, for therapeutic strengthening exercises; neurology was consulted and was started on ASA 325 mg 1 tab PO daily, continue Lipitor 40 mg daily; physiatry consult with Dr. Victoriano LainKirchmyer and Neurology consult with Dr. Roda ShuttersXu; check CBC  Dysphagia - for SLP evaluation and treatment re: swallowing function ; continue nectar thick liquid diet; aspiration precaution  Hyperlipidemia - continue Lipitor 40 mg 1 tab PO Q D Lab Results  Component Value Date   CHOL 162 02/02/2016   HDL 40 (L) 02/02/2016   LDLCALC 107 (H) 02/02/2016   TRIG 75 02/02/2016   CHOLHDL 4.1 02/02/2016   Depression - continue Prozac 40 mg 1 capsule daily; check BMP  OAB -continue Ditropan 5 mg PO Q HS  Constipation - continue Miralax 17 gm PO Q D  Essential tremor - continue Topamax 25 mg 1 tab PO BID  Diastolic CHF - no SOB      Goals of care:  Short-term rehabilitation     Kenard GowerMonina Medina-Vargas, NP [facility] [phone]

## 2016-02-25 ENCOUNTER — Encounter: Payer: Self-pay | Admitting: Internal Medicine

## 2016-02-25 ENCOUNTER — Non-Acute Institutional Stay (SKILLED_NURSING_FACILITY): Payer: Medicare Other | Admitting: Internal Medicine

## 2016-02-25 DIAGNOSIS — I639 Cerebral infarction, unspecified: Secondary | ICD-10-CM | POA: Diagnosis not present

## 2016-02-25 DIAGNOSIS — N3281 Overactive bladder: Secondary | ICD-10-CM

## 2016-02-25 DIAGNOSIS — R1319 Other dysphagia: Secondary | ICD-10-CM

## 2016-02-25 DIAGNOSIS — F329 Major depressive disorder, single episode, unspecified: Secondary | ICD-10-CM | POA: Diagnosis not present

## 2016-02-25 DIAGNOSIS — K5900 Constipation, unspecified: Secondary | ICD-10-CM

## 2016-02-25 DIAGNOSIS — F32A Depression, unspecified: Secondary | ICD-10-CM

## 2016-02-25 DIAGNOSIS — G250 Essential tremor: Secondary | ICD-10-CM

## 2016-02-25 DIAGNOSIS — R531 Weakness: Secondary | ICD-10-CM | POA: Diagnosis not present

## 2016-02-25 DIAGNOSIS — E785 Hyperlipidemia, unspecified: Secondary | ICD-10-CM | POA: Diagnosis not present

## 2016-02-25 DIAGNOSIS — K219 Gastro-esophageal reflux disease without esophagitis: Secondary | ICD-10-CM | POA: Diagnosis not present

## 2016-02-25 LAB — CBC AND DIFFERENTIAL
HCT: 36 % (ref 36–46)
Hemoglobin: 11.8 g/dL — AB (ref 12.0–16.0)
Neutrophils Absolute: 5 /uL
Platelets: 205 10*3/uL (ref 150–399)
WBC: 8.3 10^3/mL

## 2016-02-25 NOTE — Progress Notes (Signed)
LOCATION: [facility]  PCP: Gaspar Garbe, MD   Code Status: DNR  Goals of care: Advanced Directive information Advanced Directives 02/25/2016  Does patient have an advance directive? Yes  Type of Advance Directive Out of facility DNR (pink MOST or yellow form)  Does patient want to make changes to advanced directive? No - Patient declined  Copy of advanced directive(s) in chart? Yes       Extended Emergency Contact Information Primary Emergency Contact: Mellin,[name] Address: [address]          [place], [place] [address] [place] Home Phone: [phone] Mobile Phone: [phone] Relation: Spouse Secondary Emergency Contact: Johnsie Cancel [place] Home Phone: [phone] Relation: Daughter   No Known Allergies  Chief Complaint  Patient presents with  . New Admit To SNF    New Admission Visit     HPI:  Patient is a 82 y.o. female seen today for short term rehabilitation post hospital admission from 02/01/16-02/03/16 with acute CVA with linear infarct within posterior limb of left internal capsule. She had right sided weakness and slurred speech. Neurology was consulted and was placed on aspirin. She was  then in inpatient rehabilitation from 02/03/16-02/21/16. She is seen in her room today with her daughter at bedside.    Review of Systems:  Constitutional: Negative for fever, chills, diaphoresis. Energy level is slowly coming back. HENT: Negative for headache, congestion, nasal discharge Eyes: Negative for blurred vision, double vision and discharge. Wears glasses.  Respiratory: Negative for cough, shortness of breath and wheezing.   Cardiovascular: Negative for chest pain, palpitations, leg swelling.  Gastrointestinal: Negative for heartburn, nausea, vomiting, abdominal pain. Last bowel movement was today.  Genitourinary: Negative for dysuria and flank pain.  Musculoskeletal: Negative for back pain, fall in the facility.   Skin: Negative for itching, rash.  Neurological: Negative for dizziness. Psychiatric/Behavioral: Negative for depression   Past Medical History:  Diagnosis Date  . Acute blood loss anemia   . Adjustment disorder with depressed mood   . CVA (cerebral vascular accident) (HCC) 02/03/2016   Linear infarct within the posterior limb of left internal capsule  . Diastolic CHF (HCC)   . Dysphagia   . Dysphonia 02/20/2013  . Essential and other specified forms of tremor 02/20/2013  . Essential tremor   . HLD (hyperlipidemia)   . HTN (hypertension)   . OAB (overactive bladder)   . Osteoporosis   . Patient receiving subcutaneous heparin    For DVT prophylaxis 9/17  . Prediabetes   . Psoriasis   . Slow transit constipation   . Stroke (cerebrum) (HCC) 02/01/2016  . Trigeminal neuralgia 02/20/2013   Past Surgical History:  Procedure Laterality Date  . APPENDECTOMY  1940  . bladder tack    . CATARACT EXTRACTION Bilateral   . HEMORRHOID SURGERY  1960  . HUMERUS FRACTURE SURGERY    . LAPAROSCOPIC HYSTERECTOMY  2006  . torn rotator cuff    . WRIST FRACTURE SURGERY  2005   seconday shoulder 2001   Social History:   reports that she has never smoked. She has never used smokeless tobacco. She reports that she drinks alcohol. She reports that she does not use drugs.  Family History  Problem Relation Age of Onset  . Arthritis Brother     spine  . Osteoporosis Maternal Grandmother     Medications:   Medication List       Accurate as of 02/25/16 12:59 PM. Always use  your most recent med list.          aspirin 325 MG tablet Take 1 tablet (325 mg total) by mouth daily.   atorvastatin 40 MG tablet Commonly known as:  LIPITOR Take 1 tablet (40 mg total) by mouth daily at 6 PM.   FLUoxetine 20 MG tablet Commonly known as:  PROZAC Take 20 mg by mouth daily.   oxybutynin 5 MG tablet Commonly known as:  DITROPAN Take 5 mg by mouth at bedtime.   pantoprazole 40 MG  tablet Commonly known as:  PROTONIX Take 40 mg by mouth daily.   polyethylene glycol packet Commonly known as:  MIRALAX / GLYCOLAX Take 17 g by mouth daily.   topiramate 25 MG tablet Commonly known as:  TOPAMAX Take 1 tablet (25 mg total) by mouth 2 (two) times daily.       Immunizations: Immunization History  Administered Date(s) Administered  . Influenza,inj,Quad PF,36+ Mos 02/21/2016  . PPD Test 02/21/2016     Physical Exam:  Vitals:   02/25/16 1256  BP: 108/62  Pulse: 60  Resp: 14  Temp: 97.6 F (36.4 C)  TempSrc: Oral  SpO2: 94%  Weight: 108 lb (49 kg)  Height: 5' (1.524 m)   Body mass index is 21.09 kg/m.  General- elderly female, well built, in no acute distress Head- normocephalic, atraumatic Throat- moist mucus membrane Eyes- PERRLA, EOMI, no pallor, no icterus, no discharge, normal conjunctiva, normal sclera Neck- no cervical lymphadenopathy Cardiovascular- normal s1,s2, no murmur, no leg edema Respiratory- bilateral clear to auscultation, no wheeze, no rhonchi, no crackles, no use of accessory muscles Abdomen- bowel sounds present, soft, non tender Musculoskeletal- able to move all 4 extremities, generalized weakness, weakness right UE > other extremities Neurological- alert and oriented to person, place and time, mild right facial droop present Skin- warm and dry Psychiatry- normal mood and affect    Labs reviewed: Basic Metabolic Panel:  Recent Labs  82/95/62 1418 02/01/16 1443 02/04/16 0713 02/13/16 0527  NA 138 141 141 139  K 4.1 4.2 4.0 3.9  CL 109 106 110 108  CO2 24  --  25 25  GLUCOSE 112* 111* 96 94  BUN 22* 24* 17 16  CREATININE 0.70 0.70 0.73 0.73  CALCIUM 9.0  --  9.0 8.9   Liver Function Tests:  Recent Labs  02/01/16 1418 02/04/16 0713  AST 19 19  ALT 13* 14  ALKPHOS 73 74  BILITOT 0.3 0.5  PROT 6.5 6.5  ALBUMIN 3.3* 3.5   No results for input(s): LIPASE, AMYLASE in the last 8760 hours. No results for  input(s): AMMONIA in the last 8760 hours. CBC:  Recent Labs  02/01/16 1418 02/01/16 1443 02/04/16 0713 02/13/16 0527  WBC 11.5*  --  8.4 8.7  NEUTROABS 8.5*  --  4.9 4.8  HGB 12.4 12.9 13.0 11.9*  HCT 39.0 38.0 41.0 37.4  MCV 90.1  --  90.5 91.0  PLT 285  --  295 221   Cardiac Enzymes: No results for input(s): CKTOTAL, CKMB, CKMBINDEX, TROPONINI in the last 8760 hours. BNP: Invalid input(s): POCBNP CBG:  Recent Labs  02/05/16 1141  GLUCAP 164*    Radiological Exams: Ct Angio Head W Or Wo Contrast  Result Date: 02/02/2016 CLINICAL DATA:  Initial evaluation for acute stroke. EXAM: CT ANGIOGRAPHY HEAD AND NECK TECHNIQUE: Multidetector CT imaging of the head and neck was performed using the standard protocol during bolus administration of intravenous contrast. Multiplanar CT image reconstructions and  MIPs were obtained to evaluate the vascular anatomy. Carotid stenosis measurements (when applicable) are obtained utilizing NASCET criteria, using the distal internal carotid diameter as the denominator. CONTRAST:  100 cc of Isovue 370. COMPARISON:  Prior MRI from earlier the same day. FINDINGS: CT HEAD Scalp soft tissues within normal limits. Globes and orbits unremarkable. Patient status post lens extraction bilaterally. Senescent calcifications noted. Visualized paranasal sinuses are clear. No mastoid effusion. Middle ear cavities are clear. Calvarium intact. Age-related cerebral atrophy with moderate chronic microvascular ischemic disease. Known acute lacunar infarct within the left internal capsule not well seen. No acute intracranial hemorrhage. No other acute large vessel territory infarct. No mass lesion, midline shift, or mass effect. No hydrocephalus. No extra-axial fluid collection. CTA NECK Aortic arch: Visualized aortic arch of normal caliber with normal branch pattern. No high-grade stenosis at the origin of the great vessels. Mild scattered plaque within the arch itself.  Visualized subclavian arteries widely patent. Right carotid system: Right common carotid artery tortuous proximally. Right common carotid artery patent to the bifurcation without stenosis. No significant atheromatous plaque about the right bifurcation. Right ICA widely patent distally to the skullbase without stenosis, dissection, or occlusion. Right external carotid artery is branches within normal limits. Left carotid system: Left common carotid artery widely patent to the bifurcation. Minimal centric plaque about the left bifurcation without stenosis. Left ICA widely patent from the bifurcation to the skullbase without stenosis, dissection, or occlusion. Vertebral arteries:Both vertebral arteries arise from the subclavian arteries. Pre foraminal left V1 segment tortuous with multifocal atheromatous irregularity with resultant mild multi focal narrowing. Vertebral arteries otherwise widely patent within the neck without stenosis, dissection, or occlusion. Skeleton: No acute osseous abnormality. No worrisome lytic or blastic osseous lesions. Advanced degenerative spondylolysis noted within cervical spine. Other neck: Visualized lungs are clear. Visualized mediastinum grossly normal. Thyroid within normal limits. No adenopathy within the neck. No acute soft tissue abnormality. CTA HEAD Anterior circulation: Petrous segments widely patent bilaterally. Scattered calcified plaque within the cavernous ICAs with mild diffuse narrowing. A1 segments patent. Anterior communicating artery normal. Anterior cerebral arteries well opacified to their distal aspects. M1 segments widely patent without stenosis or occlusion. MCA bifurcations normal. Focal short-segment severe stenosis proximal right M2 branch, middle division (series 15, image 63). Distal MCA branches well opacified and symmetric. Posterior circulation: Focal plaque at the right V4 segment as it crosses the dural margin with secondary minimal narrowing. Right  vertebral artery otherwise widely patent to the vertebrobasilar junction. Minimal plaque within the left V4 segment without stenosis. Posterior inferior cerebral arteries patent. Basilar artery fenestrated proximally. Basilar artery widely patent to its distal aspect. Superior cerebral arteries patent. Both of the posterior cerebral arteries arise from the basilar artery. Atheromatous irregularity with moderate smooth narrowing of the left P 2 segment. More mild atheromatous irregularity within the right PCA. Venous sinuses: Patent without evidence for venous sinus thrombosis. Anatomic variants: No significant anatomic variant. No aneurysm or vascular malformation. Small focal outpouching arising from the expected location of the takeoff of the left posterior communicating artery favored to reflect a small vascular infundibulum is associated with a hypoplastic left P com. Delayed phase: No pathologic enhancement. IMPRESSION: CTA NECK IMPRESSION: 1. Negative CTA of the neck for patient age. No high-grade or critical stenosis. 2. Mild atheromatous plaque about the left carotid bifurcation without significant stenosis. 3. No significant atheromatous disease within the right carotid artery system. 4. Mild atheromatous plaque within the pre foraminal left V1 segment with resultant mild  stenosis. Otherwise widely patent vertebral arteries within the neck. CTA HEAD IMPRESSION: 1. Negative for large vessel occlusion. No high-grade or correctable stenosis. 2. Short-segment severe nonocclusive stenosis proximal right M2 branch as above. 3. Calcified atheromatous plaque within the cavernous ICAs with mild diffuse narrowing. 4. Mild to moderate multifocal atheromatous irregularity within the PCAs bilaterally, left greater than right. Electronically Signed   By: Rise Mu M.D.   On: 02/02/2016 02:08   Dg Chest 2 View  Result Date: 02/01/2016 CLINICAL DATA:  Stroke-like symptoms. EXAM: CHEST  2 VIEW COMPARISON:   None. FINDINGS: Cardiomediastinal silhouette is normal. Mediastinal contours appear intact. Torturous atherosclerotic aorta. There is no evidence of focal airspace consolidation, pleural effusion or pneumothorax. Osseous structures are without acute abnormality. Soft tissues are grossly normal. Breast implants are noted. IMPRESSION: No active cardiopulmonary disease. Electronically Signed   By: Ted Mcalpine M.D.   On: 02/01/2016 18:15   Ct Angio Neck W Or Wo Contrast  Result Date: 02/02/2016 CLINICAL DATA:  Initial evaluation for acute stroke. EXAM: CT ANGIOGRAPHY HEAD AND NECK TECHNIQUE: Multidetector CT imaging of the head and neck was performed using the standard protocol during bolus administration of intravenous contrast. Multiplanar CT image reconstructions and MIPs were obtained to evaluate the vascular anatomy. Carotid stenosis measurements (when applicable) are obtained utilizing NASCET criteria, using the distal internal carotid diameter as the denominator. CONTRAST:  100 cc of Isovue 370. COMPARISON:  Prior MRI from earlier the same day. FINDINGS: CT HEAD Scalp soft tissues within normal limits. Globes and orbits unremarkable. Patient status post lens extraction bilaterally. Senescent calcifications noted. Visualized paranasal sinuses are clear. No mastoid effusion. Middle ear cavities are clear. Calvarium intact. Age-related cerebral atrophy with moderate chronic microvascular ischemic disease. Known acute lacunar infarct within the left internal capsule not well seen. No acute intracranial hemorrhage. No other acute large vessel territory infarct. No mass lesion, midline shift, or mass effect. No hydrocephalus. No extra-axial fluid collection. CTA NECK Aortic arch: Visualized aortic arch of normal caliber with normal branch pattern. No high-grade stenosis at the origin of the great vessels. Mild scattered plaque within the arch itself. Visualized subclavian arteries widely patent. Right  carotid system: Right common carotid artery tortuous proximally. Right common carotid artery patent to the bifurcation without stenosis. No significant atheromatous plaque about the right bifurcation. Right ICA widely patent distally to the skullbase without stenosis, dissection, or occlusion. Right external carotid artery is branches within normal limits. Left carotid system: Left common carotid artery widely patent to the bifurcation. Minimal centric plaque about the left bifurcation without stenosis. Left ICA widely patent from the bifurcation to the skullbase without stenosis, dissection, or occlusion. Vertebral arteries:Both vertebral arteries arise from the subclavian arteries. Pre foraminal left V1 segment tortuous with multifocal atheromatous irregularity with resultant mild multi focal narrowing. Vertebral arteries otherwise widely patent within the neck without stenosis, dissection, or occlusion. Skeleton: No acute osseous abnormality. No worrisome lytic or blastic osseous lesions. Advanced degenerative spondylolysis noted within cervical spine. Other neck: Visualized lungs are clear. Visualized mediastinum grossly normal. Thyroid within normal limits. No adenopathy within the neck. No acute soft tissue abnormality. CTA HEAD Anterior circulation: Petrous segments widely patent bilaterally. Scattered calcified plaque within the cavernous ICAs with mild diffuse narrowing. A1 segments patent. Anterior communicating artery normal. Anterior cerebral arteries well opacified to their distal aspects. M1 segments widely patent without stenosis or occlusion. MCA bifurcations normal. Focal short-segment severe stenosis proximal right M2 branch, middle division (series 15, image 63).  Distal MCA branches well opacified and symmetric. Posterior circulation: Focal plaque at the right V4 segment as it crosses the dural margin with secondary minimal narrowing. Right vertebral artery otherwise widely patent to the  vertebrobasilar junction. Minimal plaque within the left V4 segment without stenosis. Posterior inferior cerebral arteries patent. Basilar artery fenestrated proximally. Basilar artery widely patent to its distal aspect. Superior cerebral arteries patent. Both of the posterior cerebral arteries arise from the basilar artery. Atheromatous irregularity with moderate smooth narrowing of the left P 2 segment. More mild atheromatous irregularity within the right PCA. Venous sinuses: Patent without evidence for venous sinus thrombosis. Anatomic variants: No significant anatomic variant. No aneurysm or vascular malformation. Small focal outpouching arising from the expected location of the takeoff of the left posterior communicating artery favored to reflect a small vascular infundibulum is associated with a hypoplastic left P com. Delayed phase: No pathologic enhancement. IMPRESSION: CTA NECK IMPRESSION: 1. Negative CTA of the neck for patient age. No high-grade or critical stenosis. 2. Mild atheromatous plaque about the left carotid bifurcation without significant stenosis. 3. No significant atheromatous disease within the right carotid artery system. 4. Mild atheromatous plaque within the pre foraminal left V1 segment with resultant mild stenosis. Otherwise widely patent vertebral arteries within the neck. CTA HEAD IMPRESSION: 1. Negative for large vessel occlusion. No high-grade or correctable stenosis. 2. Short-segment severe nonocclusive stenosis proximal right M2 branch as above. 3. Calcified atheromatous plaque within the cavernous ICAs with mild diffuse narrowing. 4. Mild to moderate multifocal atheromatous irregularity within the PCAs bilaterally, left greater than right. Electronically Signed   By: Rise MuBenjamin  McClintock M.D.   On: 02/02/2016 02:08   Mr Brain Wo Contrast  Result Date: 02/01/2016 CLINICAL DATA:  Right-sided facial droop. Stroke like symptoms. Slurred speech. Symptoms began at 7:30 a.m. EXAM: MRI  HEAD WITHOUT CONTRAST TECHNIQUE: Multiplanar, multiecho pulse sequences of the brain and surrounding structures were obtained without intravenous contrast. COMPARISON:  CT head without contrast 03/07/2013 FINDINGS: Brain: Acute nonhemorrhagic linear infarct is present within the posterior limb of the left internal capsule measuring up to 14 mm. No other acute infarct is present. Extensive dilated perivascular spaces are present throughout the basal ganglia and thalami. Advanced atrophy and diffuse white matter disease is present bilaterally. The ventricles are proportionate to the degree of atrophy. White matter changes extend into the brainstem. The brainstem and cerebellum are otherwise unremarkable. Internal auditory canals are within normal limits. Vascular: Flow is present in the major intracranial arteries. Skull and upper cervical spine: The skullbase is within normal limits. Midline sagittal images demonstrate an infarct involving the anterior corpus callosum. Midline intracranial structures are otherwise within normal limits. Mild degenerative changes are present in the upper cervical spine, and consistent with a age. Sinuses/Orbits: The paranasal sinuses and mastoid air cells are clear. Bilateral lens replacements are present. The globes and orbits are intact. Other: IMPRESSION: 1. Acute nonhemorrhagic 1.4 cm linear infarct within the posterior limb of the left internal capsule. This corresponds with the patient's right-sided symptoms and a aphasia. 2. Advanced atrophy and diffuse white matter disease likely reflects the sequela of chronic microvascular ischemia without other acute infarct. 3. Dilated perivascular spaces throughout the basal ganglia. These results were called by telephone at the time of interpretation on 02/01/2016 at 3:58 pm to Dr. Rubin PayorPICKERING, who verbally acknowledged these results. Electronically Signed   By: Marin Robertshristopher  Mattern M.D.   On: 02/01/2016 16:01       Assessment/Plan  Generalized weakness Will  have patient work with PT/OT as tolerated to regain strength and restore function.  Fall precautions are in place.  Acute CVA Continue aspirin 325 mg daily with lipitor 40 mg daily. Has neurology follow up. Will have her work with physical therapy and occupational therapy team to help with gait training and muscle strengthening exercises.fall precautions. Skin care. Encourage to be out of bed. Check cbc and bmp  Dysphagia To provide mechanical soft diet for now. SLP to evaluate. Aspiration precautions  gerd Stable, continue protonix  Chronic depression Continue prozac for now  Constipation Continue miralax for now  Hyperlipidemia Continue lipitor 40 mg daily  OAB continue ditropan 5 mg daily  Essential tremor continue topamax 25 mg bid and monitor     Goals of care: short term rehabilitation   Labs/tests ordered: cbc, bmp   Family/ staff Communication: reviewed care plan with patient and nursing supervisor    Oneal Grout, MD Internal Medicine [facility] [address] [place], [place] [address] Cell Phone (Monday-Friday 8 am - 5 pm): [phone] On Call: [phone] and follow prompts after 5 pm and on weekends Office Phone: [phone] Office Fax: [phone]

## 2016-02-26 DIAGNOSIS — I69951 Hemiplegia and hemiparesis following unspecified cerebrovascular disease affecting right dominant side: Secondary | ICD-10-CM | POA: Diagnosis not present

## 2016-02-26 DIAGNOSIS — M6281 Muscle weakness (generalized): Secondary | ICD-10-CM | POA: Diagnosis not present

## 2016-02-26 DIAGNOSIS — R262 Difficulty in walking, not elsewhere classified: Secondary | ICD-10-CM | POA: Diagnosis not present

## 2016-02-26 DIAGNOSIS — G250 Essential tremor: Secondary | ICD-10-CM | POA: Diagnosis not present

## 2016-02-26 DIAGNOSIS — R1310 Dysphagia, unspecified: Secondary | ICD-10-CM | POA: Diagnosis not present

## 2016-02-26 DIAGNOSIS — R2681 Unsteadiness on feet: Secondary | ICD-10-CM | POA: Diagnosis not present

## 2016-02-27 DIAGNOSIS — I69951 Hemiplegia and hemiparesis following unspecified cerebrovascular disease affecting right dominant side: Secondary | ICD-10-CM | POA: Diagnosis not present

## 2016-02-27 DIAGNOSIS — R262 Difficulty in walking, not elsewhere classified: Secondary | ICD-10-CM | POA: Diagnosis not present

## 2016-02-27 DIAGNOSIS — R1310 Dysphagia, unspecified: Secondary | ICD-10-CM | POA: Diagnosis not present

## 2016-02-27 DIAGNOSIS — M6281 Muscle weakness (generalized): Secondary | ICD-10-CM | POA: Diagnosis not present

## 2016-02-27 DIAGNOSIS — R2681 Unsteadiness on feet: Secondary | ICD-10-CM | POA: Diagnosis not present

## 2016-02-27 DIAGNOSIS — G250 Essential tremor: Secondary | ICD-10-CM | POA: Diagnosis not present

## 2016-03-06 DIAGNOSIS — R1310 Dysphagia, unspecified: Secondary | ICD-10-CM | POA: Diagnosis not present

## 2016-03-06 DIAGNOSIS — G250 Essential tremor: Secondary | ICD-10-CM | POA: Diagnosis not present

## 2016-03-06 DIAGNOSIS — R2681 Unsteadiness on feet: Secondary | ICD-10-CM | POA: Diagnosis not present

## 2016-03-06 DIAGNOSIS — R262 Difficulty in walking, not elsewhere classified: Secondary | ICD-10-CM | POA: Diagnosis not present

## 2016-03-06 DIAGNOSIS — I69951 Hemiplegia and hemiparesis following unspecified cerebrovascular disease affecting right dominant side: Secondary | ICD-10-CM | POA: Diagnosis not present

## 2016-03-06 DIAGNOSIS — M6281 Muscle weakness (generalized): Secondary | ICD-10-CM | POA: Diagnosis not present

## 2016-03-16 DIAGNOSIS — I69951 Hemiplegia and hemiparesis following unspecified cerebrovascular disease affecting right dominant side: Secondary | ICD-10-CM | POA: Diagnosis not present

## 2016-03-16 DIAGNOSIS — M6281 Muscle weakness (generalized): Secondary | ICD-10-CM | POA: Diagnosis not present

## 2016-03-16 DIAGNOSIS — R1310 Dysphagia, unspecified: Secondary | ICD-10-CM | POA: Diagnosis not present

## 2016-03-16 DIAGNOSIS — G250 Essential tremor: Secondary | ICD-10-CM | POA: Diagnosis not present

## 2016-03-16 DIAGNOSIS — R262 Difficulty in walking, not elsewhere classified: Secondary | ICD-10-CM | POA: Diagnosis not present

## 2016-03-16 DIAGNOSIS — R2681 Unsteadiness on feet: Secondary | ICD-10-CM | POA: Diagnosis not present

## 2016-03-25 ENCOUNTER — Encounter: Payer: Medicare Other | Admitting: Physical Medicine & Rehabilitation

## 2016-03-26 ENCOUNTER — Encounter: Payer: Self-pay | Admitting: Adult Health

## 2016-03-26 ENCOUNTER — Non-Acute Institutional Stay (SKILLED_NURSING_FACILITY): Payer: Medicare Other | Admitting: Adult Health

## 2016-03-26 DIAGNOSIS — I639 Cerebral infarction, unspecified: Secondary | ICD-10-CM

## 2016-03-26 DIAGNOSIS — F329 Major depressive disorder, single episode, unspecified: Secondary | ICD-10-CM | POA: Diagnosis not present

## 2016-03-26 DIAGNOSIS — I519 Heart disease, unspecified: Secondary | ICD-10-CM | POA: Diagnosis not present

## 2016-03-26 DIAGNOSIS — F32A Depression, unspecified: Secondary | ICD-10-CM

## 2016-03-26 DIAGNOSIS — R2681 Unsteadiness on feet: Secondary | ICD-10-CM

## 2016-03-26 DIAGNOSIS — I5189 Other ill-defined heart diseases: Secondary | ICD-10-CM

## 2016-03-26 DIAGNOSIS — K5900 Constipation, unspecified: Secondary | ICD-10-CM | POA: Diagnosis not present

## 2016-03-26 DIAGNOSIS — G250 Essential tremor: Secondary | ICD-10-CM

## 2016-03-26 DIAGNOSIS — R1310 Dysphagia, unspecified: Secondary | ICD-10-CM | POA: Diagnosis not present

## 2016-03-26 DIAGNOSIS — K219 Gastro-esophageal reflux disease without esophagitis: Secondary | ICD-10-CM

## 2016-03-26 DIAGNOSIS — E785 Hyperlipidemia, unspecified: Secondary | ICD-10-CM

## 2016-03-26 DIAGNOSIS — N3281 Overactive bladder: Secondary | ICD-10-CM | POA: Diagnosis not present

## 2016-03-26 NOTE — Progress Notes (Signed)
Patient ID: Kelly [name], female   DOB: [birth year], 82 y.o.   MRN: [id number]    DATE:  03/26/16   MRN:  [id number]  BIRTHDAY: [birth year]  Facility:  Nursing Home Location:  [facility]  Nursing Home Room Number: 601-P  LEVEL OF CARE:  SNF [phone])  Contact Information    Name Relation Home Work Mobile   Pemberton,[name] Spouse [phone]  [phone]   [facility],[name] Daughter [phone]         Code Status History    Date Active Date Inactive Code Status Order ID Comments User Context   02/03/2016  6:52 PM 02/03/2016  6:52 PM Full Code [id number]  Charlton AmorDaniel J Angiulli, PA-C Inpatient   02/03/2016  6:52 PM 02/21/2016  5:35 PM Full Code [id number]  Charlton Amoraniel J Angiulli, PA-C Inpatient   02/01/2016  5:21 PM 02/03/2016  6:46 PM Full Code [id number]  Marcos EkeSara E Wertman, PA-C ED    Advance Directive Documentation   Flowsheet Row Most Recent Value  Type of Advance Directive  Out of facility DNR (pink MOST or yellow form)  Pre-existing out of facility DNR order (yellow form or pink MOST form)  No data  "MOST" Form in Place?  No data       Chief Complaint  Patient presents with  . Medical Management of Chronic Issues    HISTORY OF PRESENT ILLNESS:  This is an 82 year old female who is being seen for a routine visit. She is currently having a short-term rehabilitation.  She has been admitted to [facility] with Linear infarct within the posterior limb of left internal capsule. She had right-sided weakness and slurred speech. MRI of the brain showed acute nonhemorrhagic 1.4 cm linear infarct within the posterior limb of the left internal capsule. CTA of head and neck negative. Neurology was consulted and was placed on ASA therapy.   PAST MEDICAL HISTORY:  Past Medical History:  Diagnosis Date  . Acute blood loss anemia   . Adjustment disorder with depressed mood   . CVA (cerebral vascular accident) (HCC) 02/03/2016   Linear infarct within the  posterior limb of left internal capsule  . Diastolic CHF (HCC)   . Dysphagia   . Dysphonia 02/20/2013  . Essential and other specified forms of tremor 02/20/2013  . HLD (hyperlipidemia)   . HTN (hypertension)   . OAB (overactive bladder)   . Osteoporosis   . Patient receiving subcutaneous heparin    For DVT prophylaxis 9/17  . Prediabetes   . Psoriasis   . Slow transit constipation   . Stroke (cerebrum) (HCC) 02/01/2016  . Trigeminal neuralgia 02/20/2013     CURRENT MEDICATIONS: Reviewed  Patient's Medications  New Prescriptions   No medications on file  Previous Medications   ASPIRIN 325 MG TABLET    Take 1 tablet (325 mg total) by mouth daily.   ATORVASTATIN (LIPITOR) 40 MG TABLET    Take 1 tablet (40 mg total) by mouth daily at 6 PM.   FLUOXETINE (PROZAC) 20 MG TABLET    Take 20 mg by mouth daily.   OXYBUTYNIN (DITROPAN) 5 MG TABLET    Take 5 mg by mouth at bedtime.    PANTOPRAZOLE (PROTONIX) 40 MG TABLET    Take 40 mg by mouth daily.   POLYETHYLENE GLYCOL (MIRALAX / GLYCOLAX) PACKET    Take 17 g by mouth daily.   TOPIRAMATE (TOPAMAX) 25 MG TABLET    Take 1 tablet (25 mg  total) by mouth 2 (two) times daily.  Modified Medications   No medications on file  Discontinued Medications   No medications on file     No Known Allergies   REVIEW OF SYSTEMS:  GENERAL: no change in appetite, no fatigue, no weight changes, no fever, chills or weakness EYES: Denies change in vision, dry eyes, eye pain, itching or discharge EARS: Denies change in hearing, ringing in ears, or earache NOSE: Denies nasal congestion or epistaxis MOUTH and THROAT: Denies oral discomfort, gingival pain or bleeding, pain from teeth or hoarseness   RESPIRATORY: no cough, SOB, DOE, wheezing, hemoptysis CARDIAC: no chest pain, edema or palpitations GI: no abdominal pain, diarrhea, constipation, heart burn, nausea or vomiting GU: Denies dysuria, frequency, hematuria, incontinence, or discharge PSYCHIATRIC:  Denies feeling of depression or anxiety. No report of hallucinations, insomnia, paranoia, or agitation   PHYSICAL EXAMINATION  GENERAL APPEARANCE: Well nourished. In no acute distress. Normal body habitus SKIN:  Skin is warm and dry.  HEAD: Normal in size and contour. No evidence of trauma EYES: Lids open and close normally. No blepharitis, entropion or ectropion. PERRL. Conjunctivae are clear and sclerae are white. Lenses are without opacity EARS: Pinnae are normal. Patient hears normal voice tunes of the examiner MOUTH and THROAT: Lips are without lesions. Oral mucosa is moist and without lesions. Tongue is normal in shape, size, and color and without lesions NECK: supple, trachea midline, no neck masses, no thyroid tenderness, no thyromegaly LYMPHATICS: no LAN in the neck, no supraclavicular LAN RESPIRATORY: breathing is even & unlabored, BS CTAB CARDIAC: RRR, no murmur,no extra heart sounds, no edema GI: abdomen soft, normal BS, no masses, no tenderness, no hepatomegaly, no splenomegaly EXTREMITIES:  Able to move LUE and BLE; RUE and RLE weakness NEUROLOGICAL: RUE and RLE weakness, slight right facial droop PSYCHIATRIC: Alert and oriented X 3. Affect and behavior are appropriate  LABS/RADIOLOGY: Labs reviewed: 02/25/16   Wbc 8.3  hgb 11.8  hct 36.2  mcv 92.1  Platelet 205 Basic Metabolic Panel:  Recent Labs  84/13/2408/02/09 1418 02/01/16 1443 02/04/16 0713 02/13/16 0527  NA 138 141 141 139  K 4.1 4.2 4.0 3.9  CL 109 106 110 108  CO2 24  --  25 25  GLUCOSE 112* 111* 96 94  BUN 22* 24* 17 16  CREATININE 0.70 0.70 0.73 0.73  CALCIUM 9.0  --  9.0 8.9   Liver Function Tests:  Recent Labs  02/01/16 1418 02/04/16 0713  AST 19 19  ALT 13* 14  ALKPHOS 73 74  BILITOT 0.3 0.5  PROT 6.5 6.5  ALBUMIN 3.3* 3.5   CBC:  Recent Labs  02/01/16 1418  02/04/16 0713 02/13/16 0527 02/25/16  WBC 11.5*  --  8.4 8.7 8.3  NEUTROABS 8.5*  --  4.9 4.8 5  HGB 12.4  < > 13.0 11.9* 11.8*   HCT 39.0  < > 41.0 37.4 36  MCV 90.1  --  90.5 91.0  --   PLT 285  --  295 221 205  < > = values in this interval not displayed. Lipid Panel:  Recent Labs  02/02/16 0745  HDL 40*   CBG:  Recent Labs  02/05/16 1141  GLUCAP 164*       ASSESSMENT/PLAN:  Unsteady gait - continue rehabilitation, PT and OT, for therapeutic strengthening exercises; fall precaution  Acute CVA - continue rehabilitation,  PT and OT, for therapeutic strengthening exercises; continue ASA 325 mg 1 tab PO daily, continue Lipitor  40 mg daily; follows-up with neurology  Dysphagia - continue SLP treatment for safe swallowing; continue nectar thick liquid diet; aspiration precaution  Hyperlipidemia - continue Lipitor 40 mg 1 tab PO Q D Lab Results  Component Value Date   CHOL 162 02/02/2016   HDL 40 (L) 02/02/2016   LDLCALC 107 (H) 02/02/2016   TRIG 75 02/02/2016   CHOLHDL 4.1 02/02/2016   Depression - mood is stable; continue Prozac 40 mg 1 capsule daily; check BMP  OAB -continue Ditropan 5 mg PO Q HS  Constipation - continue Miralax 17 gm PO Q D  Essential tremor - continue Topamax 25 mg 1 tab PO BID  Diastolic CHF - no SOB  GERD - continue Protonix 40 mg 1 tab PO Q D    Goals of care:  Short-term rehabilitation     Kenard Gower, NP [facility] [phone]

## 2016-03-31 ENCOUNTER — Encounter: Payer: Self-pay | Admitting: Adult Health

## 2016-03-31 NOTE — Progress Notes (Signed)
Patient ID: [name], female   DOB: 04-29-1928, 82 y.o.   MRN: [id number]   This encounter was created in error - please disregard.

## 2016-04-07 ENCOUNTER — Non-Acute Institutional Stay (SKILLED_NURSING_FACILITY): Payer: Medicare Other | Admitting: Adult Health

## 2016-04-07 ENCOUNTER — Encounter: Payer: Self-pay | Admitting: Adult Health

## 2016-04-07 DIAGNOSIS — I639 Cerebral infarction, unspecified: Secondary | ICD-10-CM | POA: Diagnosis not present

## 2016-04-07 DIAGNOSIS — N3281 Overactive bladder: Secondary | ICD-10-CM

## 2016-04-07 DIAGNOSIS — G250 Essential tremor: Secondary | ICD-10-CM | POA: Diagnosis not present

## 2016-04-07 DIAGNOSIS — K5900 Constipation, unspecified: Secondary | ICD-10-CM

## 2016-04-07 DIAGNOSIS — E785 Hyperlipidemia, unspecified: Secondary | ICD-10-CM | POA: Diagnosis not present

## 2016-04-07 DIAGNOSIS — R2681 Unsteadiness on feet: Secondary | ICD-10-CM | POA: Diagnosis not present

## 2016-04-07 DIAGNOSIS — K219 Gastro-esophageal reflux disease without esophagitis: Secondary | ICD-10-CM | POA: Diagnosis not present

## 2016-04-07 DIAGNOSIS — I519 Heart disease, unspecified: Secondary | ICD-10-CM | POA: Diagnosis not present

## 2016-04-07 DIAGNOSIS — F32A Depression, unspecified: Secondary | ICD-10-CM

## 2016-04-07 DIAGNOSIS — I5189 Other ill-defined heart diseases: Secondary | ICD-10-CM

## 2016-04-07 DIAGNOSIS — F329 Major depressive disorder, single episode, unspecified: Secondary | ICD-10-CM

## 2016-04-07 NOTE — Progress Notes (Signed)
Patient ID: Kelly [name], female   DOB: 09/09/1927, 82 y.o.   MRN: [id number]    DATE:  04/07/16   MRN:  [id number]  BIRTHDAY: 09/09/1927  Facility:  Nursing Home Location:  [name]  Nursing Home Room Number: 601-P  LEVEL OF CARE:  SNF [phone])  Contact Information    Name Relation Home Work Mobile   Kelly [name] Spouse [phone]  [phone]   [facility]Kelly Daughter [phone]         Code Status History    Date Active Date Inactive Code Status Order ID Comments User Context   02/03/2016  6:52 PM 02/03/2016  6:52 PM Full Code [id number]  Charlton AmorDaniel J Angiulli, PA-C Inpatient   02/03/2016  6:52 PM 02/21/2016  5:35 PM Full Code [id number]  Charlton Amoraniel J Angiulli, PA-C Inpatient   02/01/2016  5:21 PM 02/03/2016  6:46 PM Full Code [id number]  Marcos EkeSara E Wertman, PA-C ED    Advance Directive Documentation   Flowsheet Row Most Recent Value  Type of Advance Directive  Out of facility DNR (pink MOST or yellow form)  Pre-existing out of facility DNR order (yellow form or pink MOST form)  No data  "MOST" Form in [name]?  No data       Chief Complaint  Patient presents with  . Discharge Note    HISTORY OF PRESENT ILLNESS:  This is an 82 year old female who is for discharge home with Home health PT, OT, CNA and Nursing. DME:  16" X 16" wheelchair with elevating leg rests, leg rests, anti-tippers, cushion, brake extension and bedside commode  She [name] with Linear infarct within the posterior limb of left internal capsule. She had right-sided weakness and slurred speech. MRI of the brain showed acute nonhemorrhagic 1.4 cm linear infarct within the posterior limb of the left internal capsule. CTA of head and neck negative. Neurology was consulted and was placed on ASA therapy.  Patient was admitted to this facility for short-term rehabilitation after the patient's recent hospitalization.  Patient has completed SNF  rehabilitation and therapy has cleared the patient for discharge.   PAST MEDICAL HISTORY:  Past Medical History:  Diagnosis Date  . Acute blood loss anemia   . Adjustment disorder with depressed mood   . CVA (cerebral vascular accident) (HCC) 02/03/2016   Linear infarct within the posterior limb of left internal capsule  . Diastolic CHF (HCC)   . Dysphagia   . Dysphonia 02/20/2013  . Essential and other specified forms of tremor 02/20/2013  . HLD (hyperlipidemia)   . HTN (hypertension)   . OAB (overactive bladder)   . Osteoporosis   . Patient receiving subcutaneous heparin    For DVT prophylaxis 9/17  . Prediabetes   . Psoriasis   . Slow transit constipation   . Stroke (cerebrum) (HCC) 02/01/2016  . Trigeminal neuralgia 02/20/2013     CURRENT MEDICATIONS: Reviewed  Patient's Medications  New Prescriptions   No medications on file  Previous Medications   ASPIRIN 325 MG TABLET    Take 1 tablet (325 mg total) by mouth daily.   ATORVASTATIN (LIPITOR) 40 MG TABLET    Take 1 tablet (40 mg total) by mouth daily at 6 PM.   FLUOXETINE (PROZAC) 20 MG TABLET    Take 20 mg by mouth daily.   OXYBUTYNIN (DITROPAN) 5 MG TABLET    Take 5 mg by mouth at bedtime.    PANTOPRAZOLE (PROTONIX) 40  MG TABLET    Take 40 mg by mouth daily.   POLYETHYLENE GLYCOL (MIRALAX / GLYCOLAX) PACKET    Take 17 g by mouth daily.   TOPIRAMATE (TOPAMAX) 25 MG TABLET    Take 1 tablet (25 mg total) by mouth 2 (two) times daily.  Modified Medications   No medications on file  Discontinued Medications   No medications on file     No Known Allergies   REVIEW OF SYSTEMS:  GENERAL: no change in appetite, no fatigue, no weight changes, no fever, chills or weakness EYES: Denies change in vision, dry eyes, eye pain, itching or discharge EARS: Denies change in hearing, ringing in ears, or earache NOSE: Denies nasal congestion or epistaxis MOUTH and THROAT: Denies oral discomfort, gingival pain or bleeding, pain  from teeth or hoarseness   RESPIRATORY: no cough, SOB, DOE, wheezing, hemoptysis CARDIAC: no chest pain, edema or palpitations GI: no abdominal pain, diarrhea, constipation, heart burn, nausea or vomiting GU: Denies dysuria, frequency, hematuria, incontinence, or discharge PSYCHIATRIC: Denies feeling of depression or anxiety. No report of hallucinations, insomnia, paranoia, or agitation   PHYSICAL EXAMINATION  GENERAL APPEARANCE: Well nourished. In no acute distress. Normal body habitus SKIN:  Skin is warm and dry.  HEAD: Normal in size and contour. No evidence of trauma EYES: Lids open and close normally. No blepharitis, entropion or ectropion. PERRL. Conjunctivae are clear and sclerae are white. Lenses are without opacity EARS: Pinnae are normal. Patient hears normal voice tunes of the examiner MOUTH and THROAT: Lips are without lesions. Oral mucosa is moist and without lesions. Tongue is normal in shape, size, and color and without lesions NECK: supple, trachea midline, no neck masses, no thyroid tenderness, no thyromegaly LYMPHATICS: no LAN in the neck, no supraclavicular LAN RESPIRATORY: breathing is even & unlabored, BS CTAB CARDIAC: RRR, no murmur,no extra heart sounds, no edema GI: abdomen soft, normal BS, no masses, no tenderness, no hepatomegaly, no splenomegaly EXTREMITIES:  Able to move LUE and BLE; RUE and RLE weakness NEUROLOGICAL: RUE and RLE weakness, slight right facial droop PSYCHIATRIC: Alert and oriented X 3. Affect and behavior are appropriate  LABS/RADIOLOGY: Labs reviewed: 02/25/16   Wbc 8.3  hgb 11.8  hct 36.2  mcv 92.1  Platelet 205 Basic Metabolic Panel:  Recent Labs  29/56/2108/02/09 1418 02/01/16 1443 02/04/16 0713 02/13/16 0527  NA 138 141 141 139  K 4.1 4.2 4.0 3.9  CL 109 106 110 108  CO2 24  --  25 25  GLUCOSE 112* 111* 96 94  BUN 22* 24* 17 16  CREATININE 0.70 0.70 0.73 0.73  CALCIUM 9.0  --  9.0 8.9   Liver Function Tests:  Recent Labs   02/01/16 1418 02/04/16 0713  AST 19 19  ALT 13* 14  ALKPHOS 73 74  BILITOT 0.3 0.5  PROT 6.5 6.5  ALBUMIN 3.3* 3.5   CBC:  Recent Labs  02/01/16 1418  02/04/16 0713 02/13/16 0527 02/25/16  WBC 11.5*  --  8.4 8.7 8.3  NEUTROABS 8.5*  --  4.9 4.8 5  HGB 12.4  < > 13.0 11.9* 11.8*  HCT 39.0  < > 41.0 37.4 36  MCV 90.1  --  90.5 91.0  --   PLT 285  --  295 221 205  < > = values in this interval not displayed. Lipid Panel:  Recent Labs  02/02/16 0745  HDL 40*   CBG:  Recent Labs  02/05/16 1141  GLUCAP 164*  ASSESSMENT/PLAN:  Unsteady gait - for Home health CNA, Nursing, PT and OT, for therapeutic strengthening exercises; fall precaution  Acute CVA - for Home health CNA, Nursing, PT and OT, for therapeutic strengthening exercises; continue ASA 325 mg 1 tab PO daily, continue Lipitor 40 mg daily; follows-up with neurology  Hyperlipidemia - continue Lipitor 40 mg 1 tab PO Q D Lab Results  Component Value Date   CHOL 162 02/02/2016   HDL 40 (L) 02/02/2016   LDLCALC 107 (H) 02/02/2016   TRIG 75 02/02/2016   CHOLHDL 4.1 02/02/2016   Depression - mood is stable; continue Prozac 40 mg 1 capsule daily; check BMP  OAB -continue Ditropan 5 mg PO Q HS  Constipation - continue Miralax 17 gm PO Q D  Essential tremor - continue Topamax 25 mg 1 tab PO BID  Diastolic CHF - no SOB  GERD - continue Protonix 40 mg 1 tab PO Q D     I have filled out patient's discharge paperwork and written prescriptions.  Patient will receive home health PT, Nursing and CNA.  DME provided:  16" X 16" wheelchair with elevating leg rests, leg rests, anti-tippers, cushion, brake extension and bedside commode  Total discharge time: Greater than 30 minutes Greater than 50% was spent in counseling and coordinating of care with the patient.  Discharge time involved coordination of the discharge process with social worker, nursing staff and therapy department. Medical justification  for home health services/DME verified.    Kenard Gower, NP [employer] [phone]

## 2016-04-08 ENCOUNTER — Ambulatory Visit: Payer: Medicare Other | Admitting: Nurse Practitioner

## 2016-04-10 DIAGNOSIS — I110 Hypertensive heart disease with heart failure: Secondary | ICD-10-CM | POA: Diagnosis not present

## 2016-04-10 DIAGNOSIS — I69351 Hemiplegia and hemiparesis following cerebral infarction affecting right dominant side: Secondary | ICD-10-CM | POA: Diagnosis not present

## 2016-04-10 DIAGNOSIS — R7303 Prediabetes: Secondary | ICD-10-CM | POA: Diagnosis not present

## 2016-04-10 DIAGNOSIS — I69322 Dysarthria following cerebral infarction: Secondary | ICD-10-CM | POA: Diagnosis not present

## 2016-04-10 DIAGNOSIS — M810 Age-related osteoporosis without current pathological fracture: Secondary | ICD-10-CM | POA: Diagnosis not present

## 2016-04-10 DIAGNOSIS — G250 Essential tremor: Secondary | ICD-10-CM | POA: Diagnosis not present

## 2016-04-10 DIAGNOSIS — I5030 Unspecified diastolic (congestive) heart failure: Secondary | ICD-10-CM | POA: Diagnosis not present

## 2016-04-10 DIAGNOSIS — F329 Major depressive disorder, single episode, unspecified: Secondary | ICD-10-CM | POA: Diagnosis not present

## 2016-04-10 DIAGNOSIS — Z7982 Long term (current) use of aspirin: Secondary | ICD-10-CM | POA: Diagnosis not present

## 2016-04-12 DIAGNOSIS — G250 Essential tremor: Secondary | ICD-10-CM | POA: Diagnosis not present

## 2016-04-12 DIAGNOSIS — I5030 Unspecified diastolic (congestive) heart failure: Secondary | ICD-10-CM | POA: Diagnosis not present

## 2016-04-12 DIAGNOSIS — R7303 Prediabetes: Secondary | ICD-10-CM | POA: Diagnosis not present

## 2016-04-12 DIAGNOSIS — I110 Hypertensive heart disease with heart failure: Secondary | ICD-10-CM | POA: Diagnosis not present

## 2016-04-12 DIAGNOSIS — I69322 Dysarthria following cerebral infarction: Secondary | ICD-10-CM | POA: Diagnosis not present

## 2016-04-12 DIAGNOSIS — I69351 Hemiplegia and hemiparesis following cerebral infarction affecting right dominant side: Secondary | ICD-10-CM | POA: Diagnosis not present

## 2016-04-14 DIAGNOSIS — I69322 Dysarthria following cerebral infarction: Secondary | ICD-10-CM | POA: Diagnosis not present

## 2016-04-14 DIAGNOSIS — I69351 Hemiplegia and hemiparesis following cerebral infarction affecting right dominant side: Secondary | ICD-10-CM | POA: Diagnosis not present

## 2016-04-14 DIAGNOSIS — G250 Essential tremor: Secondary | ICD-10-CM | POA: Diagnosis not present

## 2016-04-14 DIAGNOSIS — I110 Hypertensive heart disease with heart failure: Secondary | ICD-10-CM | POA: Diagnosis not present

## 2016-04-14 DIAGNOSIS — I5030 Unspecified diastolic (congestive) heart failure: Secondary | ICD-10-CM | POA: Diagnosis not present

## 2016-04-14 DIAGNOSIS — R7303 Prediabetes: Secondary | ICD-10-CM | POA: Diagnosis not present

## 2016-04-15 DIAGNOSIS — I69351 Hemiplegia and hemiparesis following cerebral infarction affecting right dominant side: Secondary | ICD-10-CM | POA: Diagnosis not present

## 2016-04-15 DIAGNOSIS — I5030 Unspecified diastolic (congestive) heart failure: Secondary | ICD-10-CM | POA: Diagnosis not present

## 2016-04-15 DIAGNOSIS — I69322 Dysarthria following cerebral infarction: Secondary | ICD-10-CM | POA: Diagnosis not present

## 2016-04-15 DIAGNOSIS — G250 Essential tremor: Secondary | ICD-10-CM | POA: Diagnosis not present

## 2016-04-15 DIAGNOSIS — R7303 Prediabetes: Secondary | ICD-10-CM | POA: Diagnosis not present

## 2016-04-15 DIAGNOSIS — I110 Hypertensive heart disease with heart failure: Secondary | ICD-10-CM | POA: Diagnosis not present

## 2016-04-17 DIAGNOSIS — I110 Hypertensive heart disease with heart failure: Secondary | ICD-10-CM | POA: Diagnosis not present

## 2016-04-17 DIAGNOSIS — R7303 Prediabetes: Secondary | ICD-10-CM | POA: Diagnosis not present

## 2016-04-17 DIAGNOSIS — I5030 Unspecified diastolic (congestive) heart failure: Secondary | ICD-10-CM | POA: Diagnosis not present

## 2016-04-17 DIAGNOSIS — I69351 Hemiplegia and hemiparesis following cerebral infarction affecting right dominant side: Secondary | ICD-10-CM | POA: Diagnosis not present

## 2016-04-17 DIAGNOSIS — I69322 Dysarthria following cerebral infarction: Secondary | ICD-10-CM | POA: Diagnosis not present

## 2016-04-17 DIAGNOSIS — G250 Essential tremor: Secondary | ICD-10-CM | POA: Diagnosis not present

## 2016-04-20 DIAGNOSIS — I110 Hypertensive heart disease with heart failure: Secondary | ICD-10-CM | POA: Diagnosis not present

## 2016-04-20 DIAGNOSIS — I69322 Dysarthria following cerebral infarction: Secondary | ICD-10-CM | POA: Diagnosis not present

## 2016-04-20 DIAGNOSIS — I69351 Hemiplegia and hemiparesis following cerebral infarction affecting right dominant side: Secondary | ICD-10-CM | POA: Diagnosis not present

## 2016-04-20 DIAGNOSIS — R7303 Prediabetes: Secondary | ICD-10-CM | POA: Diagnosis not present

## 2016-04-20 DIAGNOSIS — I5030 Unspecified diastolic (congestive) heart failure: Secondary | ICD-10-CM | POA: Diagnosis not present

## 2016-04-20 DIAGNOSIS — G250 Essential tremor: Secondary | ICD-10-CM | POA: Diagnosis not present

## 2016-04-21 DIAGNOSIS — I110 Hypertensive heart disease with heart failure: Secondary | ICD-10-CM | POA: Diagnosis not present

## 2016-04-21 DIAGNOSIS — R7303 Prediabetes: Secondary | ICD-10-CM | POA: Diagnosis not present

## 2016-04-21 DIAGNOSIS — I69351 Hemiplegia and hemiparesis following cerebral infarction affecting right dominant side: Secondary | ICD-10-CM | POA: Diagnosis not present

## 2016-04-21 DIAGNOSIS — G250 Essential tremor: Secondary | ICD-10-CM | POA: Diagnosis not present

## 2016-04-21 DIAGNOSIS — I69322 Dysarthria following cerebral infarction: Secondary | ICD-10-CM | POA: Diagnosis not present

## 2016-04-21 DIAGNOSIS — I5030 Unspecified diastolic (congestive) heart failure: Secondary | ICD-10-CM | POA: Diagnosis not present

## 2016-04-22 DIAGNOSIS — I69322 Dysarthria following cerebral infarction: Secondary | ICD-10-CM | POA: Diagnosis not present

## 2016-04-22 DIAGNOSIS — R7303 Prediabetes: Secondary | ICD-10-CM | POA: Diagnosis not present

## 2016-04-22 DIAGNOSIS — I110 Hypertensive heart disease with heart failure: Secondary | ICD-10-CM | POA: Diagnosis not present

## 2016-04-22 DIAGNOSIS — G250 Essential tremor: Secondary | ICD-10-CM | POA: Diagnosis not present

## 2016-04-22 DIAGNOSIS — I5030 Unspecified diastolic (congestive) heart failure: Secondary | ICD-10-CM | POA: Diagnosis not present

## 2016-04-22 DIAGNOSIS — I69351 Hemiplegia and hemiparesis following cerebral infarction affecting right dominant side: Secondary | ICD-10-CM | POA: Diagnosis not present

## 2016-04-23 DIAGNOSIS — I69322 Dysarthria following cerebral infarction: Secondary | ICD-10-CM | POA: Diagnosis not present

## 2016-04-23 DIAGNOSIS — I110 Hypertensive heart disease with heart failure: Secondary | ICD-10-CM | POA: Diagnosis not present

## 2016-04-23 DIAGNOSIS — I5030 Unspecified diastolic (congestive) heart failure: Secondary | ICD-10-CM | POA: Diagnosis not present

## 2016-04-23 DIAGNOSIS — G250 Essential tremor: Secondary | ICD-10-CM | POA: Diagnosis not present

## 2016-04-23 DIAGNOSIS — I69351 Hemiplegia and hemiparesis following cerebral infarction affecting right dominant side: Secondary | ICD-10-CM | POA: Diagnosis not present

## 2016-04-23 DIAGNOSIS — R7303 Prediabetes: Secondary | ICD-10-CM | POA: Diagnosis not present

## 2016-04-24 ENCOUNTER — Encounter: Payer: Self-pay | Admitting: Physical Medicine & Rehabilitation

## 2016-04-24 ENCOUNTER — Encounter: Payer: Medicare Other | Attending: Physical Medicine & Rehabilitation | Admitting: Physical Medicine & Rehabilitation

## 2016-04-24 VITALS — BP 115/81 | HR 85 | Resp 14

## 2016-04-24 DIAGNOSIS — E785 Hyperlipidemia, unspecified: Secondary | ICD-10-CM | POA: Insufficient documentation

## 2016-04-24 DIAGNOSIS — I10 Essential (primary) hypertension: Secondary | ICD-10-CM | POA: Diagnosis not present

## 2016-04-24 DIAGNOSIS — I69351 Hemiplegia and hemiparesis following cerebral infarction affecting right dominant side: Secondary | ICD-10-CM | POA: Insufficient documentation

## 2016-04-24 DIAGNOSIS — I110 Hypertensive heart disease with heart failure: Secondary | ICD-10-CM | POA: Insufficient documentation

## 2016-04-24 DIAGNOSIS — R7303 Prediabetes: Secondary | ICD-10-CM | POA: Insufficient documentation

## 2016-04-24 DIAGNOSIS — I5030 Unspecified diastolic (congestive) heart failure: Secondary | ICD-10-CM | POA: Insufficient documentation

## 2016-04-24 DIAGNOSIS — L409 Psoriasis, unspecified: Secondary | ICD-10-CM | POA: Diagnosis not present

## 2016-04-24 DIAGNOSIS — G250 Essential tremor: Secondary | ICD-10-CM

## 2016-04-24 DIAGNOSIS — F4321 Adjustment disorder with depressed mood: Secondary | ICD-10-CM | POA: Diagnosis not present

## 2016-04-24 DIAGNOSIS — I639 Cerebral infarction, unspecified: Secondary | ICD-10-CM | POA: Diagnosis not present

## 2016-04-24 DIAGNOSIS — IMO0002 Reserved for concepts with insufficient information to code with codable children: Secondary | ICD-10-CM

## 2016-04-24 DIAGNOSIS — I69392 Facial weakness following cerebral infarction: Secondary | ICD-10-CM | POA: Diagnosis not present

## 2016-04-24 DIAGNOSIS — N3281 Overactive bladder: Secondary | ICD-10-CM | POA: Diagnosis not present

## 2016-04-24 DIAGNOSIS — R269 Unspecified abnormalities of gait and mobility: Secondary | ICD-10-CM

## 2016-04-24 DIAGNOSIS — M810 Age-related osteoporosis without current pathological fracture: Secondary | ICD-10-CM | POA: Diagnosis not present

## 2016-04-24 DIAGNOSIS — I69398 Other sequelae of cerebral infarction: Secondary | ICD-10-CM

## 2016-04-24 DIAGNOSIS — I69322 Dysarthria following cerebral infarction: Secondary | ICD-10-CM | POA: Diagnosis not present

## 2016-04-24 DIAGNOSIS — I63312 Cerebral infarction due to thrombosis of left middle cerebral artery: Secondary | ICD-10-CM

## 2016-04-24 DIAGNOSIS — G8191 Hemiplegia, unspecified affecting right dominant side: Secondary | ICD-10-CM

## 2016-04-24 MED ORDER — FLUOXETINE HCL 20 MG PO TABS: 20.0000 mg | ORAL_TABLET | Freq: Every day | ORAL | 0 refills | Status: DC

## 2016-04-24 MED ORDER — BACLOFEN 10 MG PO TABS: 10.0000 mg | ORAL_TABLET | Freq: Three times a day (TID) | ORAL | 0 refills | Status: DC

## 2016-04-24 NOTE — Progress Notes (Signed)
Subjective:    Patient ID: Kelly HumphreysBennie H Stapp, female    DOB: 1928/01/31, 82 y.o.   MRN: [id number]  HPI 82 year old right-handed female with history of essential tremor presents for hospital follow for infarct within the posterior limb of the left internal capsule.  Pt was discharged to SNF, but has since been discharged home.  She continues to fluoxetine.  She no longer has difficulty with dysphagia.  BP under control.  She denies bladder difficulties.  Her bowel movements are regular.  Overall, she is happy to be home, but still frustrated with the lack of strength in her LUE.    Pain Inventory Average Pain 0 Pain Right Now 0 My pain is no pain  In the last 24 hours, has pain interfered with the following? General activity 0 Relation with others 0 Enjoyment of life 0 What TIME of day is your pain at its worst? no pain Sleep (in general) Good  Pain is worse with: no pain Pain improves with: no pain Relief from Meds: no pain  Mobility walk with assistance use a walker ability to climb steps?  no do you drive?  no use a wheelchair  Function disabled: date disabled 02/01/2016 I need assistance with the following:  bathing, meal prep, household duties and shopping  Neuro/Psych tremor trouble walking  Prior Studies initial visit  Physicians involved in your care initial visit   Family History  Problem Relation Age of Onset  . Arthritis Brother     spine  . Osteoporosis Maternal Grandmother    Social History   Social History  . Marital status: Married    Spouse name: N/A  . Number of children: 3  . Years of education: college   Occupational History  . real estate     retired   Social History Main Topics  . Smoking status: Never Smoker  . Smokeless tobacco: Never Used  . Alcohol use Yes     Comment: Wine 2 glass daily  . Drug use: No  . Sexual activity: Not Asked   Other Topics Concern  . None   Social History Narrative  . None   Past Surgical  History:  Procedure Laterality Date  . APPENDECTOMY  1940  . bladder tack    . CATARACT EXTRACTION Bilateral   . HEMORRHOID SURGERY  1960  . HUMERUS FRACTURE SURGERY    . LAPAROSCOPIC HYSTERECTOMY  2006  . torn rotator cuff    . WRIST FRACTURE SURGERY  2005   seconday shoulder 2001   Past Medical History:  Diagnosis Date  . Acute blood loss anemia   . Adjustment disorder with depressed mood   . CVA (cerebral vascular accident) (HCC) 02/03/2016   Linear infarct within the posterior limb of left internal capsule  . Diastolic CHF (HCC)   . Dysphagia   . Dysphonia 02/20/2013  . Essential and other specified forms of tremor 02/20/2013  . HLD (hyperlipidemia)   . HTN (hypertension)   . OAB (overactive bladder)   . Osteoporosis   . Patient receiving subcutaneous heparin    For DVT prophylaxis 9/17  . Prediabetes   . Psoriasis   . Slow transit constipation   . Stroke (cerebrum) (HCC) 02/01/2016  . Trigeminal neuralgia 02/20/2013   BP 115/81 (BP Location: Left Arm, Patient Position: Sitting, Cuff Size: Normal)   Pulse 85   Resp 14   SpO2 96%   Opioid Risk Score:   Fall Risk Score:  `1  Depression screen [facility]  Depression screen PHQ 2/9 04/24/2016  Decreased Interest 0  Down, Depressed, Hopeless 0  PHQ - 2 Score 0  Altered sleeping 0  Tired, decreased energy 0  Change in appetite 0  Feeling bad or failure about yourself  0  Trouble concentrating 0  Moving slowly or fidgety/restless 0  Suicidal thoughts 0  PHQ-9 Score 0    Review of Systems  Constitutional: Negative.   HENT: Negative.   Eyes: Negative.   Respiratory: Negative.   Cardiovascular: Negative.   Gastrointestinal: Positive for constipation.  Endocrine: Negative.   Genitourinary: Negative.   Musculoskeletal: Positive for gait problem.  Skin: Negative.   Allergic/Immunologic: Negative.   Neurological: Positive for tremors.  Hematological: Bruises/bleeds easily.  Psychiatric/Behavioral: Negative.     All other systems reviewed and are negative.     Objective:   Physical Exam Constitutional: She appears well-developed. Frail. NAD.  HENT: Normocephalic and atraumatic.  Eyes: EOMI. No discharge..  Cardiovascular: Normal rate and regular rhythm.  No JVD. Respiratory: Effort normal and breath sounds normal.  GI: Soft. Bowel sounds are normal.  Musculoskeletal: She exhibits no edema or tenderness.  Neurological: She is alert and oriented.  HOH Mild dysarthria.  Right facial droop +LUE Resting tremor Motor: RUE: shoulder abduction, elbow flexion/extension 4/5, wrist 4/5, hand 3+/5 RLE: 4+/5 hip flexion, knee extension, 4+/5 ankle dorsi/plantar flexion LUE/LLE: 5/5 proximal to distal  MAS: Left wrist flexors 1+/5, finger flexors 2/4 Psychiatric: She has a normal mood and affect. Her behavior is normal Skin: Skin is warm and dry    Assessment & Plan:  82 year old right-handed female with history of essential tremor presents for hospital follow for infarct within the posterior limb of the left internal capsule.   1. Late effects infarct within the posterior limb of the left internal capsule  Cont therapies  Cont to follow up with Neurology  Pt not wearing brace, encouraged used of brace  Encouraged ROM right hand  Cont Fluoxetine   2. Dysphagia resolved             3. Neurologic gait  Cont walker for safety  4. HTN  Cont meds  Under good control at present  5. Essential tremor.   Cont Topamax per PCP  6. Overactive bladder  Pt states this is post-stroke  Will wean Ditropan 5 mg daily at bedtime.   7. Spastic hemiplegia affecting right dominant side  See #1  Baclofen 10TID trial ordered, will reaccess in 6 weeks

## 2016-04-27 ENCOUNTER — Telehealth: Payer: Self-pay

## 2016-04-27 DIAGNOSIS — I110 Hypertensive heart disease with heart failure: Secondary | ICD-10-CM | POA: Diagnosis not present

## 2016-04-27 DIAGNOSIS — R7303 Prediabetes: Secondary | ICD-10-CM | POA: Diagnosis not present

## 2016-04-27 DIAGNOSIS — I5030 Unspecified diastolic (congestive) heart failure: Secondary | ICD-10-CM | POA: Diagnosis not present

## 2016-04-27 DIAGNOSIS — I69322 Dysarthria following cerebral infarction: Secondary | ICD-10-CM | POA: Diagnosis not present

## 2016-04-27 DIAGNOSIS — I69351 Hemiplegia and hemiparesis following cerebral infarction affecting right dominant side: Secondary | ICD-10-CM | POA: Diagnosis not present

## 2016-04-27 DIAGNOSIS — G250 Essential tremor: Secondary | ICD-10-CM | POA: Diagnosis not present

## 2016-04-27 NOTE — Telephone Encounter (Signed)
Dr, Patel's patient

## 2016-04-27 NOTE — Telephone Encounter (Signed)
She should decrease the dose and frequency to 5mg  BID.  If pt continues to have side effects, should stop taking.  Thanks.

## 2016-04-27 NOTE — Telephone Encounter (Signed)
Patients husband called, reporting a side effect of drowseyness and confusion after using Baclofin medication, asking for further guidance.

## 2016-04-27 NOTE — Telephone Encounter (Signed)
Spoke with patients husband and explaned doctors instructions on cutting meds in half and only taken twice a day

## 2016-04-28 DIAGNOSIS — I69351 Hemiplegia and hemiparesis following cerebral infarction affecting right dominant side: Secondary | ICD-10-CM | POA: Diagnosis not present

## 2016-04-28 DIAGNOSIS — I5030 Unspecified diastolic (congestive) heart failure: Secondary | ICD-10-CM | POA: Diagnosis not present

## 2016-04-28 DIAGNOSIS — R7303 Prediabetes: Secondary | ICD-10-CM | POA: Diagnosis not present

## 2016-04-28 DIAGNOSIS — G250 Essential tremor: Secondary | ICD-10-CM | POA: Diagnosis not present

## 2016-04-28 DIAGNOSIS — I110 Hypertensive heart disease with heart failure: Secondary | ICD-10-CM | POA: Diagnosis not present

## 2016-04-28 DIAGNOSIS — I69322 Dysarthria following cerebral infarction: Secondary | ICD-10-CM | POA: Diagnosis not present

## 2016-04-29 ENCOUNTER — Ambulatory Visit (INDEPENDENT_AMBULATORY_CARE_PROVIDER_SITE_OTHER): Payer: Medicare Other | Admitting: Nurse Practitioner

## 2016-04-29 ENCOUNTER — Encounter: Payer: Self-pay | Admitting: Nurse Practitioner

## 2016-04-29 VITALS — BP 112/58 | HR 79 | Ht 60.0 in | Wt 110.2 lb

## 2016-04-29 DIAGNOSIS — I63312 Cerebral infarction due to thrombosis of left middle cerebral artery: Secondary | ICD-10-CM | POA: Diagnosis not present

## 2016-04-29 DIAGNOSIS — I69322 Dysarthria following cerebral infarction: Secondary | ICD-10-CM | POA: Diagnosis not present

## 2016-04-29 DIAGNOSIS — I639 Cerebral infarction, unspecified: Secondary | ICD-10-CM | POA: Diagnosis not present

## 2016-04-29 DIAGNOSIS — E785 Hyperlipidemia, unspecified: Secondary | ICD-10-CM

## 2016-04-29 DIAGNOSIS — G250 Essential tremor: Secondary | ICD-10-CM

## 2016-04-29 DIAGNOSIS — I110 Hypertensive heart disease with heart failure: Secondary | ICD-10-CM | POA: Diagnosis not present

## 2016-04-29 DIAGNOSIS — I10 Essential (primary) hypertension: Secondary | ICD-10-CM | POA: Diagnosis not present

## 2016-04-29 DIAGNOSIS — I5030 Unspecified diastolic (congestive) heart failure: Secondary | ICD-10-CM | POA: Diagnosis not present

## 2016-04-29 DIAGNOSIS — R7303 Prediabetes: Secondary | ICD-10-CM | POA: Diagnosis not present

## 2016-04-29 DIAGNOSIS — I69351 Hemiplegia and hemiparesis following cerebral infarction affecting right dominant side: Secondary | ICD-10-CM | POA: Diagnosis not present

## 2016-04-29 NOTE — Progress Notes (Signed)
[facility]  PATIENT: Shenoa H Moten DOB: 02-19-1928   REASON FOR VISIT: Hospital Follow-up for stroke  HISTORY FROM: Patient and husband    HISTORY OF PRESENT ILLNESS:Yanina H Rallsis a 82 y.o.femalewho awoke this morning normal at 6 AM. She then began feeling unwell half hour or so later lay back down awakening at 7:30 noticing right facial weakness. She states that she also noticed slurring. She is not sure if she notices much weakness on her right side, but notes that she has chronic problems with both legs, more so since her recent fall.She has significant right facial droop, right upper extremity weakness. MRI of the brain Acute nonhemorrhagic 1.4 cm linear infarct within the posterior limb of the left internal capsule. This corresponds with the patient's right-sided symptoms and a aphasia. Advanced atrophy and diffuse white matter disease. CTA of the head negative for large vessel occlusion. CTA of the neck negative. LDL 107 hemoglobin A1c 5.7. Echocardiogram was technically difficult but showed no signs of thromboembolic source. She was not on anti thrombotic prior to admission. She returns to the stroke clinic today for follow-up. She remains on aspirin 325 daily for secondary stroke prevention without further stroke or TIA symptoms. She has no bruising and bleeding .In addition she is on Lipitor 40 mg daily without complaints of myalgias, she is on Topamax 25 mg twice daily for tremor. She is currently getting home physical therapy after being at [facility] place for several weeks receiving therapy. She returns for reevaluation   REVIEW OF SYSTEMS: Full 14 system review of systems performed and notable only for those listed, all others are neg:  Constitutional: neg  Cardiovascular: neg Ear/Nose/Throat: neg  Skin: neg Eyes: neg Respiratory: neg Gastroitestinal: neg  Hematology/Lymphatic: neg  Endocrine: neg Musculoskeletal:neg Allergy/Immunology:  neg Neurological: Essential tremor, weakness Psychiatric: neg Sleep : neg   ALLERGIES: No Known Allergies  HOME MEDICATIONS: Outpatient Medications Prior to Visit  Medication Sig Dispense Refill  . aspirin 325 MG tablet Take 1 tablet (325 mg total) by mouth daily.    Marland Kitchen atorvastatin (LIPITOR) 40 MG tablet Take 1 tablet (40 mg total) by mouth daily at 6 PM.    . baclofen (LIORESAL) 10 MG tablet Take 1 tablet (10 mg total) by mouth 3 (three) times daily. 30 each 0  . FLUoxetine (PROZAC) 20 MG tablet Take 1 tablet (20 mg total) by mouth daily. 30 tablet 0  . pantoprazole (PROTONIX) 40 MG tablet Take 40 mg by mouth daily.    . polyethylene glycol (MIRALAX / GLYCOLAX) packet Take 17 g by mouth daily.    Marland Kitchen topiramate (TOPAMAX) 25 MG tablet Take 1 tablet (25 mg total) by mouth 2 (two) times daily. 6 tablet 0   No facility-administered medications prior to visit.     PAST MEDICAL HISTORY: Past Medical History:  Diagnosis Date  . Acute blood loss anemia   . Adjustment disorder with depressed mood   . CVA (cerebral vascular accident) (HCC) 02/03/2016   Linear infarct within the posterior limb of left internal capsule  . Diastolic CHF (HCC)   . Dysphagia   . Dysphonia 02/20/2013  . Essential and other specified forms of tremor 02/20/2013  . HLD (hyperlipidemia)   . HTN (hypertension)   . OAB (overactive bladder)   . Osteoporosis   . Patient receiving subcutaneous heparin    For DVT prophylaxis 9/17  . Prediabetes   . Psoriasis   . Slow transit constipation   . Stroke (cerebrum) (  HCC) 02/01/2016  . Trigeminal neuralgia 02/20/2013    PAST SURGICAL HISTORY: Past Surgical History:  Procedure Laterality Date  . APPENDECTOMY  1940  . bladder tack    . CATARACT EXTRACTION Bilateral   . HEMORRHOID SURGERY  1960  . HUMERUS FRACTURE SURGERY    . LAPAROSCOPIC HYSTERECTOMY  2006  . torn rotator cuff    . WRIST FRACTURE SURGERY  2005   seconday shoulder 2001    FAMILY  HISTORY: Family History  Problem Relation Age of Onset  . Arthritis Brother     spine  . Osteoporosis Maternal Grandmother     SOCIAL HISTORY: Social History   Social History  . Marital status: Married    Spouse name: N/A  . Number of children: 3  . Years of education: college   Occupational History  . real estate     retired   Social History Main Topics  . Smoking status: Never Smoker  . Smokeless tobacco: Never Used  . Alcohol use Yes     Comment: Wine 2 glass daily  . Drug use: No  . Sexual activity: Not on file   Other Topics Concern  . Not on file   Social History Narrative   Lives at home with her husband.   Right-handed.   No caffeine use.     PHYSICAL EXAM  Vitals:   04/29/16 1359  BP: (!) 112/58  Pulse: 79  Weight: 110 lb 3.2 oz (50 kg)  Height: 5' (1.524 m)   Body mass index is 21.52 kg/m.  Generalized: Well developed, in no acute distress  Head: normocephalic and atraumatic,. Oropharynx benign  Neck: Supple, no carotid bruits  Cardiac: Regular rate rhythm, no murmur  Musculoskeletal: No deformity   Neurological examination   Mentation: Alert oriented to time, place, history taking. Attention span and concentration appropriate. Recent and remote memory intact.  Follows all commands speech With mild dysarthria .   Cranial nerve II-XII: Fundoscopic exam not done .Pupils were equal round reactive to light extraocular movements were full, visual field were full on confrontational test. Mild right facial droop .hearing was intact to finger rubbing bilaterally. Uvula tongue midline. head turning and shoulder shrug were normal and symmetric.Tongue protrusion to the right. Motor: normal bulk and tone, full strength in the BUE, BLE, on the left, right upper extremity 4 out of 5 right lower extremity 4 out of 5  Sensory: normal and symmetric to light touch, pinprick, and  Vibration, in the upper and lower extremities Coordination: finger-nose-finger,  heel-to-shin bilaterally, no dysmetria, mild essential tremor Reflexes: 1+ upper lower and symmetric plantar responses were flexor bilaterally. Gait and Station: Not ambulated in wheelchair  DIAGNOSTIC DATA (LABS, IMAGING, TESTING) - I reviewed patient records, labs, notes, testing and imaging myself where available.  Lab Results  Component Value Date   WBC 8.3 02/25/2016   HGB 11.8 (A) 02/25/2016   HCT 36 02/25/2016   MCV 91.0 02/13/2016   PLT 205 02/25/2016      Component Value Date/Time   NA 139 02/13/2016 0527   K 3.9 02/13/2016 0527   CL 108 02/13/2016 0527   CO2 25 02/13/2016 0527   GLUCOSE 94 02/13/2016 0527   BUN 16 02/13/2016 0527   CREATININE 0.73 02/13/2016 0527   CALCIUM 8.9 02/13/2016 0527   PROT 6.5 02/04/2016 0713   ALBUMIN 3.5 02/04/2016 0713   AST 19 02/04/2016 0713   ALT 14 02/04/2016 0713   ALKPHOS 74 02/04/2016 0713   BILITOT  0.5 02/04/2016 0713   GFRNONAA >60 02/13/2016 0527   GFRAA >60 02/13/2016 0527   Lab Results  Component Value Date   CHOL 162 02/02/2016   HDL 40 (L) 02/02/2016   LDLCALC 107 (H) 02/02/2016   TRIG 75 02/02/2016   CHOLHDL 4.1 02/02/2016   Lab Results  Component Value Date   HGBA1C 5.7 (H) 02/02/2016   No results found for: [id number] Lab Results  Component Value Date   TSH 0.704 02/20/2013      ASSESSMENT AND PLAN  82 y.o. year old female  has a past medical history of  Essential and other specified forms of tremor (02/20/2013); HLD (hyperlipidemia); HTN (hypertension);  Stroke (cerebrum) (HCC) (02/01/2016);  here to follow-up for her stroke.The patient is a current patient of Dr. Roda ShuttersXu  who is out of the office today . This note is sent to the work in doctor.     PLAN: Stressed the importance of management of risk factors to prevent further stroke Continue aspirin for secondary stroke prevention Maintain strict control of hypertension with blood pressure goal below 130/90, today's reading 112/58 Cholesterol with LDL  cholesterol less than 70, followed by primary care,  most recent 107 continue Lipitor Continue therapies walking, slowly increase , activities  eat healthy diet with whole grains,  fresh fruits and vegetables Discussed risk for recurrent stroke/ TIA and answered additional questions Increase Topamax to 1 AM 2 tabs p.m. for 1 week then increase to 2 twice daily will refill for essential tremor Follow-up in 3 months This was a prolonged visit requiring 45 minutes  with extensive review of history, hospital chart, counseling and answering questions Nilda RiggsNancy Carolyn Giani Betzold, [facility]GNP, [facility]BC, APRN  [facility] [address], [address] [place], [place] [address] [phone]

## 2016-04-29 NOTE — Patient Instructions (Addendum)
Stressed the importance of management of risk factors to prevent further stroke Continue aspirin for secondary stroke prevention Maintain strict control of hypertension with blood pressure goal below 130/90, today's reading 112/58 Cholesterol with LDL cholesterol less than 70, followed by primary care,  most recent 107 continue Lipitor Continue therapies walking, slowly increase , activities  eat healthy diet with whole grains,  fresh fruits and vegetables Discussed risk for recurrent stroke/ TIA and answered additional questions Increase Topamax to 1 AM 2 tabs p.m. for 1 week then increase to 2 twice daily will refill Follow-up in 3 months

## 2016-04-30 DIAGNOSIS — I69322 Dysarthria following cerebral infarction: Secondary | ICD-10-CM | POA: Diagnosis not present

## 2016-04-30 DIAGNOSIS — I5030 Unspecified diastolic (congestive) heart failure: Secondary | ICD-10-CM | POA: Diagnosis not present

## 2016-04-30 DIAGNOSIS — I110 Hypertensive heart disease with heart failure: Secondary | ICD-10-CM | POA: Diagnosis not present

## 2016-04-30 DIAGNOSIS — R7303 Prediabetes: Secondary | ICD-10-CM | POA: Diagnosis not present

## 2016-04-30 DIAGNOSIS — G250 Essential tremor: Secondary | ICD-10-CM | POA: Diagnosis not present

## 2016-04-30 DIAGNOSIS — I69351 Hemiplegia and hemiparesis following cerebral infarction affecting right dominant side: Secondary | ICD-10-CM | POA: Diagnosis not present

## 2016-04-30 NOTE — Progress Notes (Signed)
I reviewed note and agree with plan.   Suanne MarkerVIKRAM R. Quinterius Gaida, MD 04/30/2016, 3:31 PM Certified in Neurology, Neurophysiology and Neuroimaging  [facility] [address], [address] [place], [place] [address] [phone]

## 2016-05-01 DIAGNOSIS — R7303 Prediabetes: Secondary | ICD-10-CM | POA: Diagnosis not present

## 2016-05-01 DIAGNOSIS — I110 Hypertensive heart disease with heart failure: Secondary | ICD-10-CM | POA: Diagnosis not present

## 2016-05-01 DIAGNOSIS — I69322 Dysarthria following cerebral infarction: Secondary | ICD-10-CM | POA: Diagnosis not present

## 2016-05-01 DIAGNOSIS — I69351 Hemiplegia and hemiparesis following cerebral infarction affecting right dominant side: Secondary | ICD-10-CM | POA: Diagnosis not present

## 2016-05-01 DIAGNOSIS — G250 Essential tremor: Secondary | ICD-10-CM | POA: Diagnosis not present

## 2016-05-01 DIAGNOSIS — I5030 Unspecified diastolic (congestive) heart failure: Secondary | ICD-10-CM | POA: Diagnosis not present

## 2016-05-04 DIAGNOSIS — I69351 Hemiplegia and hemiparesis following cerebral infarction affecting right dominant side: Secondary | ICD-10-CM | POA: Diagnosis not present

## 2016-05-04 DIAGNOSIS — I69322 Dysarthria following cerebral infarction: Secondary | ICD-10-CM | POA: Diagnosis not present

## 2016-05-04 DIAGNOSIS — I5030 Unspecified diastolic (congestive) heart failure: Secondary | ICD-10-CM | POA: Diagnosis not present

## 2016-05-04 DIAGNOSIS — I110 Hypertensive heart disease with heart failure: Secondary | ICD-10-CM | POA: Diagnosis not present

## 2016-05-04 DIAGNOSIS — R7303 Prediabetes: Secondary | ICD-10-CM | POA: Diagnosis not present

## 2016-05-04 DIAGNOSIS — G250 Essential tremor: Secondary | ICD-10-CM | POA: Diagnosis not present

## 2016-05-05 ENCOUNTER — Other Ambulatory Visit: Payer: Self-pay | Admitting: Adult Health

## 2016-05-06 DIAGNOSIS — G250 Essential tremor: Secondary | ICD-10-CM | POA: Diagnosis not present

## 2016-05-06 DIAGNOSIS — R7303 Prediabetes: Secondary | ICD-10-CM | POA: Diagnosis not present

## 2016-05-06 DIAGNOSIS — I110 Hypertensive heart disease with heart failure: Secondary | ICD-10-CM | POA: Diagnosis not present

## 2016-05-06 DIAGNOSIS — I69322 Dysarthria following cerebral infarction: Secondary | ICD-10-CM | POA: Diagnosis not present

## 2016-05-06 DIAGNOSIS — I69351 Hemiplegia and hemiparesis following cerebral infarction affecting right dominant side: Secondary | ICD-10-CM | POA: Diagnosis not present

## 2016-05-06 DIAGNOSIS — I5030 Unspecified diastolic (congestive) heart failure: Secondary | ICD-10-CM | POA: Diagnosis not present

## 2016-05-07 DIAGNOSIS — G250 Essential tremor: Secondary | ICD-10-CM | POA: Diagnosis not present

## 2016-05-07 DIAGNOSIS — I110 Hypertensive heart disease with heart failure: Secondary | ICD-10-CM | POA: Diagnosis not present

## 2016-05-07 DIAGNOSIS — R7303 Prediabetes: Secondary | ICD-10-CM | POA: Diagnosis not present

## 2016-05-07 DIAGNOSIS — I69322 Dysarthria following cerebral infarction: Secondary | ICD-10-CM | POA: Diagnosis not present

## 2016-05-07 DIAGNOSIS — I69351 Hemiplegia and hemiparesis following cerebral infarction affecting right dominant side: Secondary | ICD-10-CM | POA: Diagnosis not present

## 2016-05-07 DIAGNOSIS — I5030 Unspecified diastolic (congestive) heart failure: Secondary | ICD-10-CM | POA: Diagnosis not present

## 2016-05-08 ENCOUNTER — Other Ambulatory Visit: Payer: Self-pay | Admitting: Adult Health

## 2016-05-08 DIAGNOSIS — I4460 Unspecified fascicular block: Secondary | ICD-10-CM | POA: Diagnosis not present

## 2016-05-08 DIAGNOSIS — I638 Other cerebral infarction: Secondary | ICD-10-CM | POA: Diagnosis not present

## 2016-05-08 DIAGNOSIS — Z6821 Body mass index (BMI) 21.0-21.9, adult: Secondary | ICD-10-CM | POA: Diagnosis not present

## 2016-05-08 DIAGNOSIS — E559 Vitamin D deficiency, unspecified: Secondary | ICD-10-CM | POA: Diagnosis not present

## 2016-05-08 DIAGNOSIS — E7800 Pure hypercholesterolemia, unspecified: Secondary | ICD-10-CM | POA: Diagnosis not present

## 2016-05-08 DIAGNOSIS — N3281 Overactive bladder: Secondary | ICD-10-CM | POA: Diagnosis not present

## 2016-05-08 DIAGNOSIS — G250 Essential tremor: Secondary | ICD-10-CM | POA: Diagnosis not present

## 2016-05-08 DIAGNOSIS — L408 Other psoriasis: Secondary | ICD-10-CM | POA: Diagnosis not present

## 2016-05-08 DIAGNOSIS — M810 Age-related osteoporosis without current pathological fracture: Secondary | ICD-10-CM | POA: Diagnosis not present

## 2016-05-12 DIAGNOSIS — R7303 Prediabetes: Secondary | ICD-10-CM | POA: Diagnosis not present

## 2016-05-12 DIAGNOSIS — G250 Essential tremor: Secondary | ICD-10-CM | POA: Diagnosis not present

## 2016-05-12 DIAGNOSIS — I5030 Unspecified diastolic (congestive) heart failure: Secondary | ICD-10-CM | POA: Diagnosis not present

## 2016-05-12 DIAGNOSIS — I69322 Dysarthria following cerebral infarction: Secondary | ICD-10-CM | POA: Diagnosis not present

## 2016-05-12 DIAGNOSIS — I69351 Hemiplegia and hemiparesis following cerebral infarction affecting right dominant side: Secondary | ICD-10-CM | POA: Diagnosis not present

## 2016-05-12 DIAGNOSIS — I110 Hypertensive heart disease with heart failure: Secondary | ICD-10-CM | POA: Diagnosis not present

## 2016-05-14 DIAGNOSIS — I5030 Unspecified diastolic (congestive) heart failure: Secondary | ICD-10-CM | POA: Diagnosis not present

## 2016-05-14 DIAGNOSIS — I69322 Dysarthria following cerebral infarction: Secondary | ICD-10-CM | POA: Diagnosis not present

## 2016-05-14 DIAGNOSIS — R7303 Prediabetes: Secondary | ICD-10-CM | POA: Diagnosis not present

## 2016-05-14 DIAGNOSIS — I110 Hypertensive heart disease with heart failure: Secondary | ICD-10-CM | POA: Diagnosis not present

## 2016-05-14 DIAGNOSIS — G250 Essential tremor: Secondary | ICD-10-CM | POA: Diagnosis not present

## 2016-05-14 DIAGNOSIS — I69351 Hemiplegia and hemiparesis following cerebral infarction affecting right dominant side: Secondary | ICD-10-CM | POA: Diagnosis not present

## 2016-05-15 DIAGNOSIS — I69322 Dysarthria following cerebral infarction: Secondary | ICD-10-CM | POA: Diagnosis not present

## 2016-05-15 DIAGNOSIS — I69351 Hemiplegia and hemiparesis following cerebral infarction affecting right dominant side: Secondary | ICD-10-CM | POA: Diagnosis not present

## 2016-05-15 DIAGNOSIS — G250 Essential tremor: Secondary | ICD-10-CM | POA: Diagnosis not present

## 2016-05-15 DIAGNOSIS — I110 Hypertensive heart disease with heart failure: Secondary | ICD-10-CM | POA: Diagnosis not present

## 2016-05-15 DIAGNOSIS — R7303 Prediabetes: Secondary | ICD-10-CM | POA: Diagnosis not present

## 2016-05-15 DIAGNOSIS — I5030 Unspecified diastolic (congestive) heart failure: Secondary | ICD-10-CM | POA: Diagnosis not present

## 2016-05-19 DIAGNOSIS — I69351 Hemiplegia and hemiparesis following cerebral infarction affecting right dominant side: Secondary | ICD-10-CM | POA: Diagnosis not present

## 2016-05-19 DIAGNOSIS — I69322 Dysarthria following cerebral infarction: Secondary | ICD-10-CM | POA: Diagnosis not present

## 2016-05-19 DIAGNOSIS — G250 Essential tremor: Secondary | ICD-10-CM | POA: Diagnosis not present

## 2016-05-19 DIAGNOSIS — R7303 Prediabetes: Secondary | ICD-10-CM | POA: Diagnosis not present

## 2016-05-19 DIAGNOSIS — I110 Hypertensive heart disease with heart failure: Secondary | ICD-10-CM | POA: Diagnosis not present

## 2016-05-19 DIAGNOSIS — I5030 Unspecified diastolic (congestive) heart failure: Secondary | ICD-10-CM | POA: Diagnosis not present

## 2016-05-20 ENCOUNTER — Telehealth: Payer: Self-pay | Admitting: Nurse Practitioner

## 2016-05-20 DIAGNOSIS — I69322 Dysarthria following cerebral infarction: Secondary | ICD-10-CM | POA: Diagnosis not present

## 2016-05-20 DIAGNOSIS — R7303 Prediabetes: Secondary | ICD-10-CM | POA: Diagnosis not present

## 2016-05-20 DIAGNOSIS — G250 Essential tremor: Secondary | ICD-10-CM | POA: Diagnosis not present

## 2016-05-20 DIAGNOSIS — I69351 Hemiplegia and hemiparesis following cerebral infarction affecting right dominant side: Secondary | ICD-10-CM | POA: Diagnosis not present

## 2016-05-20 DIAGNOSIS — I5030 Unspecified diastolic (congestive) heart failure: Secondary | ICD-10-CM | POA: Diagnosis not present

## 2016-05-20 DIAGNOSIS — I110 Hypertensive heart disease with heart failure: Secondary | ICD-10-CM | POA: Diagnosis not present

## 2016-05-20 NOTE — Telephone Encounter (Signed)
OK to refer to out pt neuro rehab for PT, OT please order

## 2016-05-20 NOTE — Telephone Encounter (Signed)
Kelly [name] with [facility] called to give an update on patients Physical therapy/Occupational therapy.  Patient has been discharged due to the patient being ready for outpatient therapy for Physical therapy/Occupational therapy.  Wanting to see if [name] is willing to do a referral for patient.

## 2016-05-21 ENCOUNTER — Other Ambulatory Visit: Payer: Self-pay | Admitting: *Deleted

## 2016-05-21 DIAGNOSIS — I63311 Cerebral infarction due to thrombosis of right middle cerebral artery: Secondary | ICD-10-CM

## 2016-05-21 NOTE — Telephone Encounter (Signed)
[name] and let [facility] know that referral placed for PT/OT Neuro rehab outpt therapy.  Pt has one more nurse visit, but that will be it for them.

## 2016-06-01 ENCOUNTER — Ambulatory Visit: Payer: Medicare Other | Admitting: Occupational Therapy

## 2016-06-01 ENCOUNTER — Ambulatory Visit: Payer: Medicare Other | Attending: Nurse Practitioner | Admitting: Physical Therapy

## 2016-06-01 DIAGNOSIS — R29818 Other symptoms and signs involving the nervous system: Secondary | ICD-10-CM | POA: Diagnosis not present

## 2016-06-01 DIAGNOSIS — R2689 Other abnormalities of gait and mobility: Secondary | ICD-10-CM | POA: Insufficient documentation

## 2016-06-01 DIAGNOSIS — R2681 Unsteadiness on feet: Secondary | ICD-10-CM

## 2016-06-01 DIAGNOSIS — I69351 Hemiplegia and hemiparesis following cerebral infarction affecting right dominant side: Secondary | ICD-10-CM

## 2016-06-01 DIAGNOSIS — R278 Other lack of coordination: Secondary | ICD-10-CM | POA: Diagnosis not present

## 2016-06-01 DIAGNOSIS — M6281 Muscle weakness (generalized): Secondary | ICD-10-CM | POA: Diagnosis not present

## 2016-06-01 NOTE — Therapy (Signed)
[facility] [address] [place], [place], [address] Phone: [phone]   Fax:  [phone]  Physical Therapy Evaluation  Patient Details  Name: Kelly [name] MRN: [id number] Date of Birth: Jul 01, 1927 Referring Provider: Darrol Angel  Encounter Date: 06/01/2016      PT End of Session - 06/01/16 1648    Visit Number 1   Number of Visits 18   Date for PT Re-Evaluation 07/31/16   Authorization Type Medicare/[place] every 10th visit   PT Start Time 1106   PT Stop Time 1148   PT Time Calculation (min) 42 min   Equipment Utilized During Treatment Gait belt   Activity Tolerance Patient tolerated treatment well   Behavior During Therapy [facility] for tasks assessed/performed      Past Medical History:  Diagnosis Date  . Acute blood loss anemia   . Adjustment disorder with depressed mood   . CVA (cerebral vascular accident) (HCC) 02/03/2016   Linear infarct within the posterior limb of left internal capsule  . Diastolic CHF (HCC)   . Dysphagia   . Dysphonia 02/20/2013  . Essential and other specified forms of tremor 02/20/2013  . HLD (hyperlipidemia)   . HTN (hypertension)   . OAB (overactive bladder)   . Osteoporosis   . Patient receiving subcutaneous heparin    For DVT prophylaxis 9/17  . Prediabetes   . Psoriasis   . Slow transit constipation   . Stroke (cerebrum) (HCC) 02/01/2016  . Trigeminal neuralgia 02/20/2013    Past Surgical History:  Procedure Laterality Date  . APPENDECTOMY  1940  . bladder tack    . CATARACT EXTRACTION Bilateral   . HEMORRHOID SURGERY  1960  . HUMERUS FRACTURE SURGERY    . LAPAROSCOPIC HYSTERECTOMY  2006  . torn rotator cuff    . WRIST FRACTURE SURGERY  2005   seconday shoulder 2001    There were no vitals filed for this visit.       Subjective Assessment - 06/01/16 1112    Subjective Pt is an 83 year old female who presents with CVA 01/2016, with R sided weakness.   She was in rehab facility post stroke then had HHPT.  She is able to walk short distances with RW.  She has had one fall in the past 6 months.   Patient is accompained by: Family member  Husband, Kelly [name], "[name]"   Patient Stated Goals Pt's goal for therapy is to walk again.   Currently in Pain? No/denies            [facility] PT Assessment - 06/01/16 1115      Assessment   Medical Diagnosis cerebral infarct   Referring Provider Darrol Angel   Onset Date/Surgical Date --  CVA 01/2016     Precautions   Precautions Fall     Balance Screen   Has the patient fallen in the past 6 months Yes   How many times? 1   Has the patient had a decrease in activity level because of a fear of falling?  Yes   Is the patient reluctant to leave their home because of a fear of falling?  Yes     Home Environment   Living Environment Private residence   Living Arrangements Spouse/significant other   Available Help at Discharge Family   Type of Home House   Home Access Stairs to enter   Entrance Stairs-Number of Steps 2   Entrance Stairs-Rails None  Handles   Home  Layout Two level;Able to live on main level with bedroom/bathroom   Home Equipment Walker - 2 wheels;Transport chair;Wheelchair - [employer]manual;Bedside commode;Shower seat     Prior Function   Level of Independence Independent with basic ADLs;Independent with household mobility with device;Independent with community mobility without device  Walking 2 miles 2-3 times per week   Leisure Enjoys walking at park, playing bridge     Observation/Other Assessments   Focus on Therapeutic Outcomes (FOTO)  Functional Intake Status measure:  34; Neuro QOL lower extremity 32.5%     Posture/Postural Control   Posture/Postural Control Postural limitations   Postural Limitations Rounded Shoulders;Forward head;Increased thoracic kyphosis     ROM / Strength   AROM / PROM / Strength Strength;AROM     AROM   Overall AROM Comments Pt appears to have  limited active ROM dorsiflexion, limited to near neutral bilaterally     Strength   Overall Strength Deficits   Strength Assessment Site Hip;Knee;Ankle   Right/Left Hip Right;Left   Right Hip Flexion 3+/5   Left Hip Flexion 4/5   Right/Left Knee Right;Left   Right Knee Flexion 3+/5   Right Knee Extension 3+/5   Left Knee Flexion 4/5   Left Knee Extension 4/5   Right/Left Ankle Right;Left   Right Ankle Dorsiflexion 3-/5   Left Ankle Dorsiflexion 3-/5     Transfers   Transfers Sit to Stand;Stand to Sit   Sit to Stand 4: Min guard;5: Supervision   Sit to Stand Details (indicate cue type and reason) Pt tends to push through LLE to stand, with decreased weight bearing through RUE and RLE   Stand to Sit 4: Min guard;5: Supervision     Ambulation/Gait   Ambulation/Gait Yes   Ambulation/Gait Assistance 4: Min assist;4: Min guard   Ambulation/Gait Assistance Details Pt has one episode where R foot catches ground with gait.   Ambulation Distance (Feet) 60 Feet   Assistive device Rolling walker   Gait Pattern Step-through pattern;Decreased step length - right;Decreased step length - left;Decreased dorsiflexion - right;Decreased weight shift to right;Poor foot clearance - right   Ambulation Surface Level;Indoor   Gait velocity 25.95 sec= 1.26 ft/sec in 10 m walk test using RW     Standardized Balance Assessment   Standardized Balance Assessment Timed Up and Go Test;Berg Balance Test     Berg Balance Test   Sit to Stand Able to stand using hands after several tries   Standing Unsupported Able to stand 2 minutes with supervision   Sitting with Back Unsupported but Feet Supported on Floor or Stool Able to sit safely and securely 2 minutes   Stand to Sit Controls descent by using hands   Transfers Able to transfer with verbal cueing and /or supervision   Standing Unsupported with Eyes Closed Able to stand 10 seconds with supervision   Standing Ubsupported with Feet Together Needs help to  attain position but able to stand for 30 seconds with feet together   From Standing, Reach Forward with Outstretched Arm Reaches forward but needs supervision   From Standing Position, Pick up Object from Floor Unable to try/needs assist to keep balance   From Standing Position, Turn to Look Behind Over each Shoulder Needs supervision when turning   Turn 360 Degrees Needs assistance while turning   Standing Unsupported, Alternately Place Feet on Step/Stool Needs assistance to keep from falling or unable to try   Standing Unsupported, One Foot in Front Needs help to step but can hold 15  seconds   Standing on One Leg Unable to try or needs assist to prevent fall   Total Score 21   Berg comment: Scores <45/56 indicate increased fall risk.     Timed Up and Go Test   Normal TUG (seconds) 55.71   TUG Comments Scores >13.5 sec indicate increased fall risk; >30 sec indicates difficulty with ADLs in the home.                             PT Short Term Goals - 06/01/16 1656      PT SHORT TERM GOAL #1   Title Pt will perform HEP with family's supervision, to address balance, gait, transfers and strengthening.  TARGET 07/01/16   Time 5   Period Weeks   Status New     PT SHORT TERM GOAL #2   Title Pt will improve Berg Balance score to at least 26/56 for decreased fall risk.   Time 5   Period Weeks   Status New     PT SHORT TERM GOAL #3   Title Pt will improve TUG score to less than or equal to 50 seconds for decreased fall risk.   Time 5   Period Weeks   Status New     PT SHORT TERM GOAL #4   Title Pt will transfer sit<>stand, modified independently, at least 4 of 5 trials, to demo improved lower extremity strength and transfer efficiency.   Time 5   Period Weeks   Status New     PT SHORT TERM GOAL #5   Title Pt will ambulate at least 200 ft using RW, supervision, for improved gait safety and efficiency.   Time 5   Period Weeks   Status New     Additional Short  Term Goals   Additional Short Term Goals Yes     PT SHORT TERM GOAL #6   Title Pt/family will verbalize understanding of CVA education.   Time 5   Period Weeks   Status New           PT Long Term Goals - 06/01/16 2053      PT LONG TERM GOAL #1   Title Pt/family will verbalize understanding of fall prevention in the home environment.  TARGET 07/31/16   Time 9   Period Weeks   Status New     PT LONG TERM GOAL #2   Title Pt will improve Berg Balance score to at least 31/56 for decreased fall risk.   Time 9   Period Weeks   Status New     PT LONG TERM GOAL #3   Title Pt will improve TUG score to less than or equal to 45 seconds for decreased fall risk.   Time 9   Period Weeks   Status New     PT LONG TERM GOAL #4   Title Pt will improve gait velocity to at least 1.8 ft/sec for improved gait efficiency and safety.   Time 9   Period Weeks   Status New     PT LONG TERM GOAL #5   Title Pt will ambulate at least 800 ft indoor and outdoor surfaces, using RW, modified independently, for improved outdoor and community.   Time 9   Period Weeks   Status New     Additional Long Term Goals   Additional Long Term Goals Yes     PT LONG TERM GOAL #6   Title  Pt will improve Neuro QOL score on FOTO by at least 20% for improved functional mobility.   Time 9   Period Weeks   Status New               Plan - 06/26/16 1650    Clinical Impression Statement Pt is an 83 year old female who presents to OP PT following CVA in September 2017.  Prior to CVA, pt was independent and walked 2 miles 2-3 times/week in the park with husband.  Pt presents today with R sided weakness, decreased strength, decreased transfer independence, decreased gait independence, decreased balance.  Pt has had one fall since CVA; pt is at fall risk per Berg, TUG, and gait velocity scores.  Pt would benefit from skilled physical therapy to address the above stated deficits.   Rehab Potential Good   PT  Frequency 2x / week  1x/wk for 1 week, then   PT Duration 2 weeks   PT Treatment/Interventions ADLs/Self Care Home Management;Functional mobility training;Stair training;Gait training;Patient/family education;Neuromuscular re-education;Balance training;Therapeutic exercise;Therapeutic activities   PT Next Visit Plan Brief review of HEP from home (standing balance exercises); work on core and hip strengthening, standing balance and gait training   Consulted and Agree with Plan of Care Patient;Family member/caregiver   Family Member Consulted Husband      Patient will benefit from skilled therapeutic intervention in order to improve the following deficits and impairments:  Abnormal gait, Decreased balance, Decreased mobility, Decreased range of motion, Decreased strength, Difficulty walking  Visit Diagnosis: Other abnormalities of gait and mobility  Unsteadiness on feet  Muscle weakness (generalized)      G-Codes - 06/26/2016 10/25/2104    Functional Assessment Tool Used TUG 55.71 sec, gait velocity 1.26 ft/sec RW, Berg 21/56, FOTO intake score 34; Neuro QOL 32.5%   Functional Limitation Mobility: Walking and moving around   Mobility: Walking and Moving Around Current Status [phone]) At least 40 percent but less than 60 percent impaired, limited or restricted   Mobility: Walking and Moving Around Goal Status [phone]) At least 20 percent but less than 40 percent impaired, limited or restricted       Problem List Patient Active Problem List   Diagnosis Date Noted  . Slow transit constipation   . Acute blood loss anemia   . Adjustment disorder with depressed mood   . Cerebrovascular accident (CVA) (HCC) 02/03/2016  . Dysarthria, post-stroke   . Dysphagia, post-stroke   . Leukocytosis   . Prediabetes   . Right hemiparesis (HCC)   . Cerebral infarction due to unspecified mechanism   . Other secondary hypertension   . Overactive bladder   . Diastolic dysfunction   . Hyperlipidemia   .  Essential hypertension   . Essential tremor   . Stroke (cerebrum) (HCC) 02/01/2016  . Facial droop due to stroke 02/01/2016  . Essential and other specified forms of tremor 02/20/2013  . Dysphonia 02/20/2013  . Trigeminal neuralgia 02/20/2013    Nekia Maxham W. 06/26/2016, 9:08 PM  Gean Maidens., PT  [facility] [address] [place], [place], [address] Phone: [phone]   Fax:  [phone]  Name: AZALYA GALYON MRN: [id number] Date of Birth: May 09, 1928

## 2016-06-02 NOTE — Therapy (Signed)
[facility] [address] [place], [place], [address] Phone: [phone]   Fax:  [phone]  Occupational Therapy Evaluation  Patient Details  Name: Kelly [name] MRN: [id number] Date of Birth: 1928/04/15 Referring Provider: Darrol Angel, NP  Encounter Date: 06/01/2016      OT End of Session - 06/02/16 1450    Visit Number 1   Number of Visits 17   Date for OT Re-Evaluation 07/29/16   Authorization Type Medicare / [place] (g-code needed)   Authorization - Visit Number 1   Authorization - Number of Visits 10   OT Start Time 1149   OT Stop Time 1230   OT Time Calculation (min) 41 min   Activity Tolerance Patient tolerated treatment well   Behavior During Therapy [facility] for tasks assessed/performed      Past Medical History:  Diagnosis Date  . Acute blood loss anemia   . Adjustment disorder with depressed mood   . CVA (cerebral vascular accident) (HCC) 02/03/2016   Linear infarct within the posterior limb of left internal capsule  . Diastolic CHF (HCC)   . Dysphagia   . Dysphonia 02/20/2013  . Essential and other specified forms of tremor 02/20/2013  . HLD (hyperlipidemia)   . HTN (hypertension)   . OAB (overactive bladder)   . Osteoporosis   . Patient receiving subcutaneous heparin    For DVT prophylaxis 9/17  . Prediabetes   . Psoriasis   . Slow transit constipation   . Stroke (cerebrum) (HCC) 02/01/2016  . Trigeminal neuralgia 02/20/2013    Past Surgical History:  Procedure Laterality Date  . APPENDECTOMY  1940  . bladder tack    . CATARACT EXTRACTION Bilateral   . HEMORRHOID SURGERY  1960  . HUMERUS FRACTURE SURGERY    . LAPAROSCOPIC HYSTERECTOMY  2006  . torn rotator cuff    . WRIST FRACTURE SURGERY  2005   seconday shoulder 2001    There were no vitals filed for this visit.      Subjective Assessment - 06/01/16 1701    Subjective  "I've come a long way, but I still have a lot to work  on"   Patient is accompained by: Family member  [name]   Pertinent History adjustment disorder with depressed mood, diastolic CHF, osteoporosis, hx of fall, essential tremor, HTN, prediabetes, trigeminal neuralgia   Limitations fall risk   Patient Stated Goals be able to write, fold clothes, do dishes   Currently in Pain? No/denies           [facility] OT Assessment - 06/02/16 0001      Assessment   Diagnosis CVA   Referring Provider Darrol Angel, NP   Onset Date 02/01/16   Prior Therapy in hospital, [place], Home health      Precautions   Precautions Fall     Balance Screen   Has the patient fallen in the past 6 months Yes   How many times? 1  2 weeks ago     Home  Environment   Family/patient expects to be discharged to: Private residence   Lives With Spouse  dtr Iona Hansen) lives next door and provides assist     Prior Function   Level of Independence Independent with basic ADLs;Independent with household mobility with device;Independent with community mobility without device  walking 2 miles, 2-3 days/week   Leisure Enjoys walking at park, playing bridge     ADL   Eating/Feeding Set up  90% with  RUE (assist for cutting/opening)   Grooming --  able to brush hair/teeth, 50% with RUE   Upper Body Bathing Modified independent  except assist for washing/blow drying hair   Lower Body Bathing Modified independent   Upper Body Dressing --  needs Assist for bra, jacket   Lower Body Dressing Supervision/safety   Toilet Tranfer Supervision/safety   Toileting - Clothing Manipulation Modified independent   Toileting -  Hygiene Modified Independent   Diplomatic Services operational officer     IADL   Prior Level of Function Shopping pt not performing   Prior Level of Function Light Housekeeping pt performed prior   Light Housekeeping --  husband performs   Prior Level of Function Meal Prep husband always  performed   Prior Level of Function Community Mobility drove previously   [employer] on family or friends for transportation   Medication Management --  husband performs   Prior Level of Function Financial Management pt performed   Financial Management Dependent  with writing     Mobility   Mobility Status Comments uses RW and w/c in the home for mobility.    see PT eval for details     Written Expression   Dominant Hand Right   Handwriting --  <25% legible     Vision - History   Baseline Vision Wears glasses all the time   Additional Comments transitional lenses, denies visual changes from CVA     Cognition   Overall Cognitive Status Within Functional Limits for tasks assessed     Sensation   Light Touch Appears Intact  pt denies changes     Coordination   9 Hole Peg Test Right   Right 9 Hole Peg Test unable    Box and Blocks R-3 blocks     Tone   Assessment Location --     AROM   Overall AROM  Deficits   Overall AROM Comments R shouder flex 90* (with compensation and elbow flex), abduction 70*, ER approx 50%, IR approx 75%, wrist ext to approx -30*, able to oppose to digits 2-4 only, approx 80% elbow ext      PROM   Overall PROM  Deficits   Overall PROM Comments decr R shoulder      Hand Function   Right Hand Grip (lbs) 1   Left Hand Grip (lbs) 34     RUE Tone   RUE Tone Mild                           OT Short Term Goals - 06/02/16 1516      OT SHORT TERM GOAL #1   Title Pt will be independent with initial HEP--check STGs 06/30/16   Time 4   Period Weeks   Status New     OT SHORT TERM GOAL #2   Title Pt will be able to write name with at least 75% legibility.   Time 4   Period Weeks   Status New     OT SHORT TERM GOAL #3   Title Pt will demo at least 95* R shoulder flex with min compensation in prep for functional reach.   Time 4   Period Weeks   Status New     OT SHORT TERM GOAL #4   Title Pt will improve  functional reaching and grasp/release as shown by improving score on box and blocks by at least  5.   Baseline 3 blocks   Time 4   Period Weeks   Status New           OT Long Term Goals - 06/02/16 1526      OT LONG TERM GOAL #1   Title Pt will be independent with updated HEP--07/30/16   Time 8   Period Weeks   Status New     OT LONG TERM GOAL #2   Title Pt will be able to write name with at least 95% legibility and simple sentence with at least 75% legibility.   Time 8   Period Weeks   Status New     OT LONG TERM GOAL #3   Title Pt will demo at least 100* R shoulder flex with min compensation in prep for functional reach.   Period Weeks   Status New     OT LONG TERM GOAL #4   Title Pt will improve functional reaching and grasp/release as shown by improving score on box and blocks by at least 10.   Baseline 3 blocks   Time 8   Period Weeks   Status New     OT LONG TERM GOAL #5   Title Pt will be able to use dominant RUE for grooming tasks at least 75% of the time.   Time 8   Period Weeks   Status New     Long Term Additional Goals   Additional Long Term Goals Yes     OT LONG TERM GOAL #6   Title Pt will perform simple home maintenance tasks with supervision.   Time 8   Period Weeks   Status New               Plan - 06/02/16 1451    Clinical Impression Statement Pt is a 83 y.o. female s/p CVA 02/01/16.  Pt with PMH that includes:  adjustment disorder with depressed mood, diastolic CHF, osteoporosis, hx of fall, essential tremor, HTN, prediabetes, trigeminal neuralgia.  Pt was independent prior to CVA.  Pt presents with R hemiplegia with decr strength, decr ROM, decr coordination, spasticity, and decr balance/functional mobility for ADLs.     Rehab Potential Good   OT Frequency 2x / week   OT Duration 8 weeks  +eval   OT Treatment/Interventions Self-care/ADL training;Therapeutic exercise;DME and/or AE instruction;Building services engineer;Therapeutic  activities;Patient/family education;Balance training;Neuromuscular education;Electrical Stimulation;Moist Heat;Energy conservation;Passive range of motion;Therapeutic exercises;Splinting;Manual Therapy;Fluidtherapy;Ultrasound;Cryotherapy;Parrafin   Plan initiate HEP   Consulted and Agree with Plan of Care Patient;Family member/caregiver   Family Member Consulted husband      Patient will benefit from skilled therapeutic intervention in order to improve the following deficits and impairments:  Decreased coordination, Decreased activity tolerance, Decreased knowledge of use of DME, Decreased strength, Impaired UE functional use, Impaired tone, Decreased range of motion, Decreased balance, Decreased mobility, Difficulty walking  Visit Diagnosis: Hemiplegia and hemiparesis following cerebral infarction affecting right dominant side (HCC)  Other symptoms and signs involving the nervous system  Other lack of coordination  Other abnormalities of gait and mobility  Unsteadiness on feet      G-Codes - 2016-06-15 1559    Functional Assessment Tool Used 9-hole peg test:   unable with RUE.  Box and blocks test:  R-3 blocks   Functional Limitation Carrying, moving and handling objects   Carrying, Moving and Handling Objects Current Status (B1478) At least 60 percent but less than 80 percent impaired, limited or restricted   Carrying, Moving and Handling Objects  Goal Status ([id number]) At least 20 percent but less than 40 percent impaired, limited or restricted      Problem List Patient Active Problem List   Diagnosis Date Noted  . Slow transit constipation   . Acute blood loss anemia   . Adjustment disorder with depressed mood   . Cerebrovascular accident (CVA) (HCC) 02/03/2016  . Dysarthria, post-stroke   . Dysphagia, post-stroke   . Leukocytosis   . Prediabetes   . Right hemiparesis (HCC)   . Cerebral infarction due to unspecified mechanism   . Other secondary hypertension   . Overactive  bladder   . Diastolic dysfunction   . Hyperlipidemia   . Essential hypertension   . Essential tremor   . Stroke (cerebrum) (HCC) 02/01/2016  . Facial droop due to stroke 02/01/2016  . Essential and other specified forms of tremor 02/20/2013  . Dysphonia 02/20/2013  . Trigeminal neuralgia 02/20/2013    [facility]RE[facility]AN,Zaneta Lightcap 06/02/2016, 4:03 PM  [facility] [address] [place], [place], [address] Phone: [phone]   Fax:  [phone]  Name: Dan HumphreysBennie H Vinton MRN: [id number] Date of Birth: 01/03/1928   Willa FraterAngela Adelynne Joerger, OTR/L [facility] [address] [place], [place]  [address] [phone] phone [phone] 06/02/16 4:03 PM

## 2016-06-04 ENCOUNTER — Ambulatory Visit: Payer: Medicare Other | Admitting: Physical Therapy

## 2016-06-04 ENCOUNTER — Ambulatory Visit: Payer: Medicare Other | Admitting: Occupational Therapy

## 2016-06-04 DIAGNOSIS — M6281 Muscle weakness (generalized): Secondary | ICD-10-CM | POA: Diagnosis not present

## 2016-06-04 DIAGNOSIS — I69351 Hemiplegia and hemiparesis following cerebral infarction affecting right dominant side: Secondary | ICD-10-CM

## 2016-06-04 DIAGNOSIS — R2681 Unsteadiness on feet: Secondary | ICD-10-CM | POA: Diagnosis not present

## 2016-06-04 DIAGNOSIS — R2689 Other abnormalities of gait and mobility: Secondary | ICD-10-CM | POA: Diagnosis not present

## 2016-06-04 DIAGNOSIS — R278 Other lack of coordination: Secondary | ICD-10-CM | POA: Diagnosis not present

## 2016-06-04 DIAGNOSIS — R29818 Other symptoms and signs involving the nervous system: Secondary | ICD-10-CM | POA: Diagnosis not present

## 2016-06-04 NOTE — Patient Instructions (Addendum)
Pelvic Tilt: Posterior - Legs Bent (Supine)    Tighten stomach and flatten back by tucking bottom under. Hold _3___ seconds. Relax. Repeat __10__ times per set. Do __2__ sets per session. (Between sets, do the stretch "Knee roll" below).  Do __1__ sessions per day.    Knee Roll    Lying on back, with knees bent and feet flat on floor, arms outstretched to sides. Place one to two pillows beside your legs for your legs to rest upon. Slowly lower knees to side, hold 10 seconds. Back to starting position. Then to opposite side, hold 10 seconds. Return to starting position. Keep shoulders and arms in contact with floor.  You should feel a mild stretch through your hips and sides.      Copyright  VHI. All rights reserved.    http://orth.exer.us/203   Copyright  VHI. All rights reserved.      Sit to Stand    Sit on edge of chair, feet flat on floor. Place left hand on your left knee and stand upright, extending fully and finish with head up/looking forward. Remember to lean head and shoulders forward "nose over your toes" and DO NOT allow back of your legs to push against the chair to help you stand up or sit down.  Repeat ___10_ times per set. Do _1___ sets per session. Do ___1_ sessions per day.  http://orth.exer.us/735   Copyright  VHI. All rights reserved.    Walking with rolling walker  With husband's assistance. Focus on looking straight ahead/head up and your heel should touch first when stepping. Do at least 3 times up and back your hallway.

## 2016-06-04 NOTE — Therapy (Signed)
[facility] [address] [place], [place], [address] Phone: [phone]   Fax:  [phone]  Occupational Therapy Treatment  Patient Details  Name: Kelly [name] MRN: [id number] Date of Birth: 06-Oct-1927 Referring Provider: Darrol Angel, NP  Encounter Date: 06/04/2016      OT End of Session - 06/04/16 1629    Visit Number 2   Number of Visits 17   Date for OT Re-Evaluation 07/29/16   Authorization Type Medicare / [employer] (g-code needed)   Authorization - Visit Number 2   Authorization - Number of Visits 10   OT Start Time 1534   OT Stop Time 1618   OT Time Calculation (min) 44 min   Activity Tolerance Patient tolerated treatment well   Behavior During Therapy [facility] for tasks assessed/performed      Past Medical History:  Diagnosis Date  . Acute blood loss anemia   . Adjustment disorder with depressed mood   . CVA (cerebral vascular accident) (HCC) 02/03/2016   Linear infarct within the posterior limb of left internal capsule  . Diastolic CHF (HCC)   . Dysphagia   . Dysphonia 02/20/2013  . Essential and other specified forms of tremor 02/20/2013  . HLD (hyperlipidemia)   . HTN (hypertension)   . OAB (overactive bladder)   . Osteoporosis   . Patient receiving subcutaneous heparin    For DVT prophylaxis 9/17  . Prediabetes   . Psoriasis   . Slow transit constipation   . Stroke (cerebrum) (HCC) 02/01/2016  . Trigeminal neuralgia 02/20/2013    Past Surgical History:  Procedure Laterality Date  . APPENDECTOMY  1940  . bladder tack    . CATARACT EXTRACTION Bilateral   . HEMORRHOID SURGERY  1960  . HUMERUS FRACTURE SURGERY    . LAPAROSCOPIC HYSTERECTOMY  2006  . torn rotator cuff    . WRIST FRACTURE SURGERY  2005   seconday shoulder 2001    There were no vitals filed for this visit.      Subjective Assessment - 06/04/16 1624    Subjective  Pt reports that he hand splint make her hand very stiff  in the am and that it hurts   Patient is accompained by: Family member  husband Kelly [name]   Pertinent History adjustment disorder with depressed mood, diastolic CHF, osteoporosis, hx of fall, essential tremor, HTN, prediabetes, trigeminal neuralgia   Limitations fall risk   Patient Stated Goals be able to write, fold clothes, do dishes   Currently in Pain? No/denies        Gentle wrist mobs and passive stretch to fingers/wrist in isolated and full composite ext as able as well as supination and elbow ext stretch.     Pt initially with pain/discomfort with trying to place hand flat on table, but after stretching/joint mobs, pt able to place hand on table on towel and perform light "scrubbing/wiping" table with cueing for elbow ext stretch.  Practicing tracing simple shapes/lines using built-up tan grip and The Pencil Grip.  Pt demo incr ease with The Pencil Grip, but continues difficulty with mod decr accuracy, particularly due to hand tightness.  Issued The Pencil Grip for home.  Flipping cards with min-mod cueing and intermittent min facilitation for finger ext of all fingers and supination.     Recommended that pt discuss spasticity management with MD at visit tomorrow (as pt is not taking oral medication anymore).  Also recommended that pt bring splint next visit for check due to  reports of discomfort.   Provided cueing for use of BUEs for sit>stand during session.                     OT Education - 06/04/16 1623    Education provided Yes   Education Details Initial HEP--see pt instructions   Person(s) Educated Patient;Spouse   Methods Explanation;Demonstration;Verbal cues;Handout   Comprehension Verbalized understanding;Returned demonstration;Verbal cues required          OT Short Term Goals - 06/02/16 1516      OT SHORT TERM GOAL #1   Title Pt will be independent with initial HEP--check STGs 06/30/16   Time 4   Period Weeks   Status New     OT SHORT TERM  GOAL #2   Title Pt will be able to write name with at least 75% legibility.   Time 4   Period Weeks   Status New     OT SHORT TERM GOAL #3   Title Pt will demo at least 95* R shoulder flex with min compensation in prep for functional reach.   Time 4   Period Weeks   Status New     OT SHORT TERM GOAL #4   Title Pt will improve functional reaching and grasp/release as shown by improving score on box and blocks by at least 5.   Baseline 3 blocks   Time 4   Period Weeks   Status New           OT Long Term Goals - 06/02/16 1526      OT LONG TERM GOAL #1   Title Pt will be independent with updated HEP--07/30/16   Time 8   Period Weeks   Status New     OT LONG TERM GOAL #2   Title Pt will be able to write name with at least 95% legibility and simple sentence with at least 75% legibility.   Time 8   Period Weeks   Status New     OT LONG TERM GOAL #3   Title Pt will demo at least 100* R shoulder flex with min compensation in prep for functional reach.   Period Weeks   Status New     OT LONG TERM GOAL #4   Title Pt will improve functional reaching and grasp/release as shown by improving score on box and blocks by at least 10.   Baseline 3 blocks   Time 8   Period Weeks   Status New     OT LONG TERM GOAL #5   Title Pt will be able to use dominant RUE for grooming tasks at least 75% of the time.   Time 8   Period Weeks   Status New     Long Term Additional Goals   Additional Long Term Goals Yes     OT LONG TERM GOAL #6   Title Pt will perform simple home maintenance tasks with supervision.   Time 8   Period Weeks   Status New               Plan - 06/04/16 1632    Clinical Impression Statement Pt with spasticity/tightness affecting ability to use RUE functionally (tends to keep hand in flexed positioned and needs cueing for finger extension).  Pt may benefit from medical management of spasticity and recommended pt discuss with MD tomorrow.   Rehab  Potential Good   OT Frequency 2x / week   OT Duration 8 weeks  +eval  OT Treatment/Interventions Self-care/ADL training;Therapeutic exercise;DME and/or AE instruction;Building services engineerunctional Mobility Training;Therapeutic activities;Patient/family education;Balance training;Neuromuscular education;Electrical Stimulation;Moist Heat;Energy conservation;Passive range of motion;Therapeutic exercises;Splinting;Manual Therapy;Fluidtherapy;Ultrasound;Cryotherapy;Parrafin   Plan check splint in pt brings in: manual therapy, stretching, neuro re-ed RUE (AAROM in supine)   OT Home Exercise Plan Education provided:  06/05/15 Initial HEP (table slides with light wt. bearing, tracing, and flipping cards)   Consulted and Agree with Plan of Care Patient;Family member/caregiver   Family Member Consulted husband      Patient will benefit from skilled therapeutic intervention in order to improve the following deficits and impairments:  Decreased coordination, Decreased activity tolerance, Decreased knowledge of use of DME, Decreased strength, Impaired UE functional use, Impaired tone, Decreased range of motion, Decreased balance, Decreased mobility, Difficulty walking  Visit Diagnosis: Hemiplegia and hemiparesis following cerebral infarction affecting right dominant side (HCC)  Other symptoms and signs involving the nervous system  Other lack of coordination  Muscle weakness (generalized)  Other abnormalities of gait and mobility    Problem List Patient Active Problem List   Diagnosis Date Noted  . Slow transit constipation   . Acute blood loss anemia   . Adjustment disorder with depressed mood   . Cerebrovascular accident (CVA) (HCC) 02/03/2016  . Dysarthria, post-stroke   . Dysphagia, post-stroke   . Leukocytosis   . Prediabetes   . Right hemiparesis (HCC)   . Cerebral infarction due to unspecified mechanism   . Other secondary hypertension   . Overactive bladder   . Diastolic dysfunction   .  Hyperlipidemia   . Essential hypertension   . Essential tremor   . Stroke (cerebrum) (HCC) 02/01/2016  . Facial droop due to stroke 02/01/2016  . Essential and other specified forms of tremor 02/20/2013  . Dysphonia 02/20/2013  . Trigeminal neuralgia 02/20/2013    [facility]entonFREEMAN,Ephram Kornegay 06/04/2016, 4:59 PM  [facility] [address] [place], [place], [address] Phone: [phone]   Fax:  [phone]  Name: Dan HumphreysBennie H Cato MRN: [id number] Date of Birth: [birth year]   Willa FraterAngela Xeng Kucher, OTR/L [facility] [address] [place], [place]  [address] [phone] phone [phone] 06/04/16 5:00 PM

## 2016-06-04 NOTE — Therapy (Signed)
[facility] [address] [place], [place], [address] Phone: [phone]   Fax:  [phone]  Physical Therapy Treatment  Patient Details  Name: Kelly [name] MRN: [id number] Date of Birth: 08-Oct-1927 Referring Provider: Darrol Angel  Encounter Date: 06/04/2016      PT End of Session - 06/04/16 1701    Visit Number 2   Number of Visits 18   Date for PT Re-Evaluation 07/31/16   Authorization Type Medicare/[place] every 10th visit   PT Start Time 1454   PT Stop Time 1535   PT Time Calculation (min) 41 min   Equipment Utilized During Treatment Gait belt   Activity Tolerance Patient tolerated treatment well   Behavior During Therapy [facility] for tasks assessed/performed      Past Medical History:  Diagnosis Date  . Acute blood loss anemia   . Adjustment disorder with depressed mood   . CVA (cerebral vascular accident) (HCC) 02/03/2016   Linear infarct within the posterior limb of left internal capsule  . Diastolic CHF (HCC)   . Dysphagia   . Dysphonia 02/20/2013  . Essential and other specified forms of tremor 02/20/2013  . HLD (hyperlipidemia)   . HTN (hypertension)   . OAB (overactive bladder)   . Osteoporosis   . Patient receiving subcutaneous heparin    For DVT prophylaxis 9/17  . Prediabetes   . Psoriasis   . Slow transit constipation   . Stroke (cerebrum) (HCC) 02/01/2016  . Trigeminal neuralgia 02/20/2013    Past Surgical History:  Procedure Laterality Date  . APPENDECTOMY  1940  . bladder tack    . CATARACT EXTRACTION Bilateral   . HEMORRHOID SURGERY  1960  . HUMERUS FRACTURE SURGERY    . LAPAROSCOPIC HYSTERECTOMY  2006  . torn rotator cuff    . WRIST FRACTURE SURGERY  2005   seconday shoulder 2001    There were no vitals filed for this visit.      Subjective Assessment - 06/04/16 1456    Subjective Patient/husband report they continue to do exercises given by their son Freida Busman  Forehand, PT) which includes standing alternating toe and heel raises, marching, hip abdct. all with bil UE support. Husband reports he is always at pt's side if she is walking. Mostly she uses her wheelchair at home.    Patient is accompained by: Family member  Husband, [name], "[name]"   Patient Stated Goals Pt's goal for therapy is to walk again.   Currently in Pain? No/denies                         [facility] Adult PT Treatment/Exercise - 06/04/16 1645      Bed Mobility   Bed Mobility Sit to Supine;Supine to Sit   Supine to Sit 6: Modified independent (Device/Increase time)   Sit to Supine 4: Min assist   Sit to Supine - Details (indicate cue type and reason) pt unable to lift legs onto mat with multiple attempts with min assist to complete task. Husband noted she did not need help with this until recent fall.     Transfers   Transfers Sit to Stand;Stand to Sit   Sit to Stand 4: Min guard;4: Min assist   Sit to Stand Details Tactile cues for weight shifting;Tactile cues for posture;Verbal cues for technique;Verbal cues for precautions/safety;Verbal cues for safe use of DME/AE;Manual facilitation for weight shifting   Sit to Stand Details (indicate cue type and  reason) x 15 throughout session; all with RW and required instructional cues 20% of trials   Stand to Sit 4: Min guard;4: Min assist;With upper extremity assist   Stand to Sit Details (indicate cue type and reason) Tactile cues for weight shifting;Verbal cues for technique;Verbal cues for safe use of DME/AE;Manual facilitation for weight shifting   Stand to Sit Details see comments sit to stand; required assist to control descent 25% of trials     Ambulation/Gait   Ambulation/Gait Assistance 4: Min guard;4: Min assist   Ambulation/Gait Assistance Details imbalance when turning on carpet and when she lifts RW during turning   Ambulation Distance (Feet) 110 Feet  seated rest; 60; rest; 50   Assistive device Rolling walker    Gait Pattern Step-through pattern;Decreased step length - right;Decreased step length - left;Decreased dorsiflexion - right;Decreased weight shift to right;Poor foot clearance - right;Right foot flat;Left foot flat;Shuffle   Ambulation Surface Level;Indoor   Gait Comments assist for balance; vc for proper use of RW (esp turns); vc for upright posture and heelstrike     Posture/Postural Control   Postural Limitations Rounded Shoulders;Forward head;Increased thoracic kyphosis   Posture Comments pt can only partially reverse kyphosis when supine (2 pillows)     Exercises   Exercises Lumbar     Lumbar Exercises: Stretches   Lower Trunk Rotation --  6 reps; 5- 10 second hold   Lower Trunk Rotation Limitations very limited with knees ~12 inches from mat   Pelvic Tilt Other (comment)  10 reps x 2 sets; 3 sec hold                PT Education - 06/04/16 1700    Education provided Yes   Education Details HEP see pt instructions; Husband must assist patient anytime she is going to stand with RW.   Person(s) Educated Patient;Spouse   Methods Explanation;Demonstration;Verbal cues;Handout   Comprehension Verbalized understanding;Returned demonstration;Need further instruction          PT Short Term Goals - 06/01/16 1656      PT SHORT TERM GOAL #1   Title Pt will perform HEP with family's supervision, to address balance, gait, transfers and strengthening.  TARGET 07/01/16   Time 5   Period Weeks   Status New     PT SHORT TERM GOAL #2   Title Pt will improve Berg Balance score to at least 26/56 for decreased fall risk.   Time 5   Period Weeks   Status New     PT SHORT TERM GOAL #3   Title Pt will improve TUG score to less than or equal to 50 seconds for decreased fall risk.   Time 5   Period Weeks   Status New     PT SHORT TERM GOAL #4   Title Pt will transfer sit<>stand, modified independently, at least 4 of 5 trials, to demo improved lower extremity strength and  transfer efficiency.   Time 5   Period Weeks   Status New     PT SHORT TERM GOAL #5   Title Pt will ambulate at least 200 ft using RW, supervision, for improved gait safety and efficiency.   Time 5   Period Weeks   Status New     Additional Short Term Goals   Additional Short Term Goals Yes     PT SHORT TERM GOAL #6   Title Pt/family will verbalize understanding of CVA education.   Time 5   Period Weeks  Status New           PT Long Term Goals - 06/01/16 2053      PT LONG TERM GOAL #1   Title Pt/family will verbalize understanding of fall prevention in the home environment.  TARGET 07/31/16   Time 9   Period Weeks   Status New     PT LONG TERM GOAL #2   Title Pt will improve Berg Balance score to at least 31/56 for decreased fall risk.   Time 9   Period Weeks   Status New     PT LONG TERM GOAL #3   Title Pt will improve TUG score to less than or equal to 45 seconds for decreased fall risk.   Time 9   Period Weeks   Status New     PT LONG TERM GOAL #4   Title Pt will improve gait velocity to at least 1.8 ft/sec for improved gait efficiency and safety.   Time 9   Period Weeks   Status New     PT LONG TERM GOAL #5   Title Pt will ambulate at least 800 ft indoor and outdoor surfaces, using RW, modified independently, for improved outdoor and community.   Time 9   Period Weeks   Status New     Additional Long Term Goals   Additional Long Term Goals Yes     PT LONG TERM GOAL #6   Title Pt will improve Neuro QOL score on FOTO by at least 20% for improved functional mobility.   Time 9   Period Weeks   Status New               Plan - 06/04/16 1703    Clinical Impression Statement Skilled interventions focusing on strengthening, balance, and gait. Instructed in exercises for HEP and husband present for education. Patient remains a high fall risk even with use of RW and reinforced to patient and husband the need for husband to assist her anytime she is  standing or walking with the RW. (Husband reports pt fell ~2 weeks ago when she got up to walk alone and without RW).    Rehab Potential Good   PT Frequency 2x / week  1x/wk for 1 week, then   PT Duration 2 weeks   PT Treatment/Interventions ADLs/Self Care Home Management;Functional mobility training;Stair training;Gait training;Patient/family education;Neuromuscular re-education;Balance training;Therapeutic exercise;Therapeutic activities;DME Instruction   PT Next Visit Plan check ?'s on HEP issued 1/11; focus on standing balance and gait training with RW (for safe use); add heel cord stretch   Consulted and Agree with Plan of Care Patient;Family member/caregiver   Family Member Consulted Husband      Patient will benefit from skilled therapeutic intervention in order to improve the following deficits and impairments:  Abnormal gait, Decreased balance, Decreased mobility, Decreased range of motion, Decreased strength, Difficulty walking, Decreased cognition, Decreased safety awareness, Impaired flexibility  Visit Diagnosis: Hemiplegia and hemiparesis following cerebral infarction affecting right dominant side (HCC)  Muscle weakness (generalized)  Other abnormalities of gait and mobility     Problem List Patient Active Problem List   Diagnosis Date Noted  . Slow transit constipation   . Acute blood loss anemia   . Adjustment disorder with depressed mood   . Cerebrovascular accident (CVA) (HCC) 02/03/2016  . Dysarthria, post-stroke   . Dysphagia, post-stroke   . Leukocytosis   . Prediabetes   . Right hemiparesis (HCC)   . Cerebral infarction due to unspecified mechanism   .  Other secondary hypertension   . Overactive bladder   . Diastolic dysfunction   . Hyperlipidemia   . Essential hypertension   . Essential tremor   . Stroke (cerebrum) (HCC) 02/01/2016  . Facial droop due to stroke 02/01/2016  . Essential and other specified forms of tremor 02/20/2013  . Dysphonia  02/20/2013  . Trigeminal neuralgia 02/20/2013    Zena AmosLynn P Legacy Carrender, PT 06/04/2016, 5:12 PM  [facility] [address] [place], [place], [address] Phone: [phone]   Fax:  [phone]  Name: Dan HumphreysBennie H Dieudonne MRN: [id number] Date of Birth: 03-14-1928

## 2016-06-04 NOTE — Patient Instructions (Signed)
   1.  Flip playing cards over.  Open your hand fully before picking up card using all fingers (don't just pick up with 2 fingers).  Then flip card over.  2.  Practicing tracing shapes (squares, circles, lines, letters) using pen grip.  3.  Wipe table top with right hand in standing with fingers out (make sure palm is down).

## 2016-06-05 ENCOUNTER — Encounter: Payer: Medicare Other | Attending: Physical Medicine & Rehabilitation | Admitting: Physical Medicine & Rehabilitation

## 2016-06-05 ENCOUNTER — Other Ambulatory Visit: Payer: Self-pay | Admitting: Physical Medicine & Rehabilitation

## 2016-06-05 ENCOUNTER — Encounter: Payer: Self-pay | Admitting: Physical Medicine & Rehabilitation

## 2016-06-05 VITALS — BP 116/66 | HR 84 | Resp 14

## 2016-06-05 DIAGNOSIS — I639 Cerebral infarction, unspecified: Secondary | ICD-10-CM

## 2016-06-05 DIAGNOSIS — R269 Unspecified abnormalities of gait and mobility: Secondary | ICD-10-CM

## 2016-06-05 DIAGNOSIS — I69392 Facial weakness following cerebral infarction: Secondary | ICD-10-CM

## 2016-06-05 DIAGNOSIS — I69351 Hemiplegia and hemiparesis following cerebral infarction affecting right dominant side: Secondary | ICD-10-CM | POA: Diagnosis not present

## 2016-06-05 DIAGNOSIS — G250 Essential tremor: Secondary | ICD-10-CM

## 2016-06-05 DIAGNOSIS — M810 Age-related osteoporosis without current pathological fracture: Secondary | ICD-10-CM | POA: Insufficient documentation

## 2016-06-05 DIAGNOSIS — R7303 Prediabetes: Secondary | ICD-10-CM | POA: Diagnosis not present

## 2016-06-05 DIAGNOSIS — I110 Hypertensive heart disease with heart failure: Secondary | ICD-10-CM | POA: Diagnosis not present

## 2016-06-05 DIAGNOSIS — G8111 Spastic hemiplegia affecting right dominant side: Secondary | ICD-10-CM

## 2016-06-05 DIAGNOSIS — N3281 Overactive bladder: Secondary | ICD-10-CM

## 2016-06-05 DIAGNOSIS — I69398 Other sequelae of cerebral infarction: Secondary | ICD-10-CM

## 2016-06-05 DIAGNOSIS — E785 Hyperlipidemia, unspecified: Secondary | ICD-10-CM | POA: Diagnosis not present

## 2016-06-05 DIAGNOSIS — IMO0002 Reserved for concepts with insufficient information to code with codable children: Secondary | ICD-10-CM

## 2016-06-05 DIAGNOSIS — I63312 Cerebral infarction due to thrombosis of left middle cerebral artery: Secondary | ICD-10-CM | POA: Diagnosis not present

## 2016-06-05 DIAGNOSIS — F4321 Adjustment disorder with depressed mood: Secondary | ICD-10-CM | POA: Diagnosis not present

## 2016-06-05 DIAGNOSIS — L409 Psoriasis, unspecified: Secondary | ICD-10-CM | POA: Diagnosis not present

## 2016-06-05 DIAGNOSIS — I5030 Unspecified diastolic (congestive) heart failure: Secondary | ICD-10-CM | POA: Diagnosis not present

## 2016-06-05 MED ORDER — FLUOXETINE HCL 20 MG PO TABS: 20.0000 mg | ORAL_TABLET | Freq: Every day | ORAL | 2 refills | Status: DC

## 2016-06-05 NOTE — Progress Notes (Signed)
Subjective:    Patient ID: Kelly [name], female    DOB: December 23, 1927, 83 y.o.   MRN: [id number]  HPI 83 year old right-handed female with history of essential tremor presents for follow up for infarct within the posterior limb of the left internal capsule.  Last clinic visit 04/24/16.  Husband present, who provides most of the history. Since that time, she has graduated to outpt therapies, 2/week.  She is wearing her brace at night. She continues to take Fluoxetine, husband would like to continue for mood.  She uses walker for ambulation. Topamax increased by Neurology. Husband states she is taking some medication for bladder control, but he is not sure what.  Pt states her urinary symptoms are well managed.  Her OT sent a note, which was reviewed, requesting evaluation of spasticity, pt would like to know more about Botox.   Pain Inventory Average Pain 0 Pain Right Now 0 My pain is no pain  In the last 24 hours, has pain interfered with the following? General activity 0 Relation with others 0 Enjoyment of life 0 What TIME of day is your pain at its worst? no pain Sleep (in general) Good  Pain is worse with: no pain Pain improves with: no pain Relief from Meds: no pain  Mobility walk with assistance use a walker ability to climb steps?  no do you drive?  no use a wheelchair  Function disabled: date disabled 02/01/2016 I need assistance with the following:  bathing, meal prep, household duties and shopping  Neuro/Psych tremor trouble walking  Prior Studies initial visit  Physicians involved in your care initial visit   Family History  Problem Relation Age of Onset  . Arthritis Brother     spine  . Osteoporosis Maternal Grandmother    Social History   Social History  . Marital status: Married    Spouse name: N/A  . Number of children: 3  . Years of education: college   Occupational History  . real estate     retired   Social History Main Topics  .  Smoking status: Never Smoker  . Smokeless tobacco: Never Used  . Alcohol use Yes     Comment: Wine 2 glass daily  . Drug use: No  . Sexual activity: Not Asked   Other Topics Concern  . None   Social History Narrative   Lives at home with her husband.   Right-handed.   No caffeine use.   Past Surgical History:  Procedure Laterality Date  . APPENDECTOMY  1940  . bladder tack    . CATARACT EXTRACTION Bilateral   . HEMORRHOID SURGERY  1960  . HUMERUS FRACTURE SURGERY    . LAPAROSCOPIC HYSTERECTOMY  2006  . torn rotator cuff    . WRIST FRACTURE SURGERY  2005   seconday shoulder 2001   Past Medical History:  Diagnosis Date  . Acute blood loss anemia   . Adjustment disorder with depressed mood   . CVA (cerebral vascular accident) (HCC) 02/03/2016   Linear infarct within the posterior limb of left internal capsule  . Diastolic CHF (HCC)   . Dysphagia   . Dysphonia 02/20/2013  . Essential and other specified forms of tremor 02/20/2013  . HLD (hyperlipidemia)   . HTN (hypertension)   . OAB (overactive bladder)   . Osteoporosis   . Patient receiving subcutaneous heparin    For DVT prophylaxis 9/17  . Prediabetes   . Psoriasis   . Slow transit constipation   .  Stroke (cerebrum) (HCC) 02/01/2016  . Trigeminal neuralgia 02/20/2013   BP 116/66   Pulse 84   Resp 14   SpO2 94%   Opioid Risk Score:   Fall Risk Score:  `1  Depression screen PHQ 2/9  Depression screen PHQ 2/9 04/24/2016  Decreased Interest 0  Down, Depressed, Hopeless 0  PHQ - 2 Score 0  Altered sleeping 0  Tired, decreased energy 0  Change in appetite 0  Feeling bad or failure about yourself  0  Trouble concentrating 0  Moving slowly or fidgety/restless 0  Suicidal thoughts 0  PHQ-9 Score 0    Review of Systems  Constitutional: Negative.   HENT: Negative.   Eyes: Negative.   Respiratory: Negative.   Cardiovascular: Negative.   Gastrointestinal: Negative.   Endocrine: Negative.     Genitourinary: Negative.   Musculoskeletal: Positive for gait problem.  Skin: Negative.   Allergic/Immunologic: Negative.   Neurological: Positive for tremors.  Hematological: Bruises/bleeds easily.  Psychiatric/Behavioral: Negative.   All other systems reviewed and are negative.     Objective:   Physical Exam Constitutional: She appears well-developed. Frail. NAD.  HENT: Normocephalic and atraumatic.  Eyes: EOMI. No discharge..  Cardiovascular: RRR.  No JVD. Respiratory: Effort normal and breath sounds normal.  GI: Soft. Bowel sounds are normal.  Musculoskeletal: She exhibits no edema or tenderness.  Neurological: She is alert and oriented.  HOH Mild dysarthria.  Right facial droop +LUE Resting tremor Motor: RUE: shoulder abduction, elbow flexion/extension 4/5, wrist 4/5, hand 3+/5 RLE: 4+/5 hip flexion, knee extension, ankle dorsi/plantar flexion MAS: right wrist flexors 1+/4, finger flexors 2/4 LUE/LLE: 5/5 proximal to distal  Psychiatric: She has a normal mood and affect. Her behavior is normal Skin: Skin is warm and dry    Assessment & Plan:  83 year old right-handed female with history of essential tremor presents for hospital follow for infarct within the posterior limb of the left internal capsule.  1. Late effects infarct within the posterior limb of the left internal capsule  Cont therapies  Cont to follow up with Neurology  Cont brace, to be evaluated by OT  2. Neurologic gait  Cont walker for safety, improving  4. Essential tremor.   Cont Topamax per Neurology (recently increased)  5. Overactive bladder  Pt states this is post-stroke  On medication, but unsure which one, not in chart, encourage husband to provide name.  Will consider weaning  6. Spastic hemiplegia affecting right dominant side (G81.11).  See #1  Pt could not tolerate Baclofen due to lethargy  Will schedule for Botox - long discussion with pt regarding risk/benefits of procedure and  goals  7. Depression  Cont Fluoxetine, refilled. Husband notes good improvement with mood and energy with medication

## 2016-06-09 ENCOUNTER — Ambulatory Visit: Payer: Medicare Other | Admitting: Occupational Therapy

## 2016-06-09 ENCOUNTER — Ambulatory Visit: Payer: Medicare Other | Admitting: Physical Therapy

## 2016-06-09 ENCOUNTER — Encounter: Payer: Self-pay | Admitting: Occupational Therapy

## 2016-06-09 DIAGNOSIS — R2681 Unsteadiness on feet: Secondary | ICD-10-CM | POA: Diagnosis not present

## 2016-06-09 DIAGNOSIS — R2689 Other abnormalities of gait and mobility: Secondary | ICD-10-CM | POA: Diagnosis not present

## 2016-06-09 DIAGNOSIS — I69351 Hemiplegia and hemiparesis following cerebral infarction affecting right dominant side: Secondary | ICD-10-CM | POA: Diagnosis not present

## 2016-06-09 DIAGNOSIS — M6281 Muscle weakness (generalized): Secondary | ICD-10-CM

## 2016-06-09 DIAGNOSIS — R278 Other lack of coordination: Secondary | ICD-10-CM

## 2016-06-09 DIAGNOSIS — R29818 Other symptoms and signs involving the nervous system: Secondary | ICD-10-CM

## 2016-06-09 NOTE — Therapy (Signed)
[facility] [address] [place], [place], [address] Phone: [phone]   Fax:  [phone]  Occupational Therapy Treatment  Patient Details  Name: Kelly [name] MRN: [id number] Date of Birth: [birth year] Referring Provider: Darrol Angelarolyn Martin, NP  Encounter Date: 06/09/2016      OT End of Session - 06/09/16 1633    Visit Number 3   Number of Visits 17   Date for OT Re-Evaluation 07/29/16   Authorization Type Medicare / [place] (g-code needed)   Authorization - Visit Number 3   Authorization - Number of Visits 10   OT Start Time 1402   OT Stop Time 1445   OT Time Calculation (min) 43 min   Activity Tolerance Patient tolerated treatment well      Past Medical History:  Diagnosis Date  . Acute blood loss anemia   . Adjustment disorder with depressed mood   . CVA (cerebral vascular accident) (HCC) 02/03/2016   Linear infarct within the posterior limb of left internal capsule  . Diastolic CHF (HCC)   . Dysphagia   . Dysphonia 02/20/2013  . Essential and other specified forms of tremor 02/20/2013  . HLD (hyperlipidemia)   . HTN (hypertension)   . OAB (overactive bladder)   . Osteoporosis   . Patient receiving subcutaneous heparin    For DVT prophylaxis 9/17  . Prediabetes   . Psoriasis   . Slow transit constipation   . Stroke (cerebrum) (HCC) 02/01/2016  . Trigeminal neuralgia 02/20/2013    Past Surgical History:  Procedure Laterality Date  . APPENDECTOMY  1940  . bladder tack    . CATARACT EXTRACTION Bilateral   . HEMORRHOID SURGERY  1960  . HUMERUS FRACTURE SURGERY    . LAPAROSCOPIC HYSTERECTOMY  2006  . torn rotator cuff    . WRIST FRACTURE SURGERY  2005   seconday shoulder 2001    There were no vitals filed for this visit.      Subjective Assessment - 06/09/16 1449    Subjective  I forgot my hand splint   Patient is accompained by: Family member  husband initially   Pertinent History  adjustment disorder with depressed mood, diastolic CHF, osteoporosis, hx of fall, essential tremor, HTN, prediabetes, trigeminal neuralgia   Limitations fall risk   Patient Stated Goals be able to write, fold clothes, do dishes   Currently in Pain? No/denies  my hand hurts in the morning but now it is just stiff                      OT Treatments/Exercises (OP) - 06/09/16 0001      Neurological Re-education Exercises   Other Exercises 2 Neuro re ed in supine in closed chain activity to address full extension of RUE with open hand and neutral wrist for reeducation of use of RUE.  Chest presses with large ball - pt initially needed  min faciliation however progressed to needing only vc's to attend to R hand. Progresssed to overhead reach with open with min facilitation to attend to RUE and maintain elbow extension.  Progressed to open chain unilateral functional reach in supine using PNF diagonal patterns in order to reinforce fulll extension in all directions as well as reciprocal reaching. Pt able to complete with vc's only.      Manual Therapy   Manual Therapy Joint mobilization;Soft tissue mobilization   Manual therapy comments joint, soft tissue mob for R scap and R hand to decrease  tightness, improve alignment prior to neuro re ed with excellent results.  Pt "postures" hand in flexion but able to move into active extension of R hand once mobilized and with using vision to compensate for  impaired sensation.                   OT Short Term Goals - 06/09/16 1632      OT SHORT TERM GOAL #1   Title Pt will be independent with initial HEP--check STGs 06/30/16   Time 4   Period Weeks   Status New     OT SHORT TERM GOAL #2   Title Pt will be able to write name with at least 75% legibility.   Time 4   Period Weeks   Status New     OT SHORT TERM GOAL #3   Title Pt will demo at least 95* R shoulder flex with min compensation in prep for functional reach.   Time 4    Period Weeks   Status New     OT SHORT TERM GOAL #4   Title Pt will improve functional reaching and grasp/release as shown by improving score on box and blocks by at least 5.   Baseline 3 blocks   Time 4   Period Weeks   Status New           OT Long Term Goals - 06/09/16 1632      OT LONG TERM GOAL #1   Title Pt will be independent with updated HEP--07/30/16   Time 8   Period Weeks   Status New     OT LONG TERM GOAL #2   Title Pt will be able to write name with at least 95% legibility and simple sentence with at least 75% legibility.   Time 8   Period Weeks   Status New     OT LONG TERM GOAL #3   Title Pt will demo at least 100* R shoulder flex with min compensation in prep for functional reach.   Period Weeks   Status New     OT LONG TERM GOAL #4   Title Pt will improve functional reaching and grasp/release as shown by improving score on box and blocks by at least 10.   Baseline 3 blocks   Time 8   Period Weeks   Status New     OT LONG TERM GOAL #5   Title Pt will be able to use dominant RUE for grooming tasks at least 75% of the time.   Time 8   Period Weeks   Status New     OT LONG TERM GOAL #6   Title Pt will perform simple home maintenance tasks with supervision.   Time 8   Period Weeks   Status New               Plan - 06/09/16 1632    Clinical Impression Statement Pt progressing toward goals. Pt has more ability to use RUE than pt is currently doing due to sensory loss, inattention and other deficits.    Rehab Potential Good   OT Frequency 2x / week   OT Duration 8 weeks   OT Treatment/Interventions Self-care/ADL training;Therapeutic exercise;DME and/or AE instruction;Building services engineer;Therapeutic activities;Patient/family education;Balance training;Neuromuscular education;Electrical Stimulation;Moist Heat;Energy conservation;Passive range of motion;Therapeutic exercises;Splinting;Manual  Therapy;Fluidtherapy;Ultrasound;Cryotherapy;Parrafin   Plan check splint if pt brings in - pt will likely need custom splint, NMR for RUE, manual therapy, functional use   Consulted and Agree with Plan  of Care Patient      Patient will benefit from skilled therapeutic intervention in order to improve the following deficits and impairments:  Decreased coordination, Decreased activity tolerance, Decreased knowledge of use of DME, Decreased strength, Impaired UE functional use, Impaired tone, Decreased range of motion, Decreased balance, Decreased mobility, Difficulty walking  Visit Diagnosis: Hemiplegia and hemiparesis following cerebral infarction affecting right dominant side (HCC)  Other symptoms and signs involving the nervous system  Other lack of coordination  Muscle weakness (generalized)  Unsteadiness on feet    Problem List Patient Active Problem List   Diagnosis Date Noted  . Slow transit constipation   . Acute blood loss anemia   . Adjustment disorder with depressed mood   . Cerebrovascular accident (CVA) (HCC) 02/03/2016  . Dysarthria, post-stroke   . Dysphagia, post-stroke   . Leukocytosis   . Prediabetes   . Right hemiparesis (HCC)   . Cerebral infarction due to unspecified mechanism   . Other secondary hypertension   . Overactive bladder   . Diastolic dysfunction   . Hyperlipidemia   . Essential hypertension   . Essential tremor   . Stroke (cerebrum) (HCC) 02/01/2016  . Facial droop due to stroke 02/01/2016  . Essential and other specified forms of tremor 02/20/2013  . Dysphonia 02/20/2013  . Trigeminal neuralgia 02/20/2013    Norton Pastel, OTR/L 06/09/2016, 4:34 PM  [facility] [address] [place], [place], [address] Phone: [phone]   Fax:  [phone]  Name: Kelly [name] MRN: [id number] Date of Birth: 01/28/1928

## 2016-06-09 NOTE — Therapy (Signed)
[facility] [address] [place], [place], [address] Phone: [phone]   Fax:  [phone]  Physical Therapy Treatment  Patient Details  Name: Kelly [name] MRN: [id number] Date of Birth: 1928/02/06 Referring Provider: Darrol Angel  Encounter Date: 06/09/2016      PT End of Session - 06/09/16 1628    Visit Number 3   Number of Visits 18   Date for PT Re-Evaluation 07/31/16   Authorization Type Medicare/[employer]-[place] every 10th visit   PT Start Time 1445   PT Stop Time 1535   PT Time Calculation (min) 50 min   Equipment Utilized During Treatment Gait belt   Activity Tolerance Patient tolerated treatment well   Behavior During Therapy [facility] for tasks assessed/performed      Past Medical History:  Diagnosis Date  . Acute blood loss anemia   . Adjustment disorder with depressed mood   . CVA (cerebral vascular accident) (HCC) 02/03/2016   Linear infarct within the posterior limb of left internal capsule  . Diastolic CHF (HCC)   . Dysphagia   . Dysphonia 02/20/2013  . Essential and other specified forms of tremor 02/20/2013  . HLD (hyperlipidemia)   . HTN (hypertension)   . OAB (overactive bladder)   . Osteoporosis   . Patient receiving subcutaneous heparin    For DVT prophylaxis 9/17  . Prediabetes   . Psoriasis   . Slow transit constipation   . Stroke (cerebrum) (HCC) 02/01/2016  . Trigeminal neuralgia 02/20/2013    Past Surgical History:  Procedure Laterality Date  . APPENDECTOMY  1940  . bladder tack    . CATARACT EXTRACTION Bilateral   . HEMORRHOID SURGERY  1960  . HUMERUS FRACTURE SURGERY    . LAPAROSCOPIC HYSTERECTOMY  2006  . torn rotator cuff    . WRIST FRACTURE SURGERY  2005   seconday shoulder 2001    There were no vitals filed for this visit.      Subjective Assessment - 06/09/16 1602    Subjective Patient reports she feels she is doing better walking with the RW. She reports she  has been walking back and forth in the hallway at home with her husband beside her. Reports difficulty with toe raises standing exercise at counter (given by her PT son).    Patient is accompained by: Family member   Patient Stated Goals Pt's goal for therapy is to walk again.   Currently in Pain? No/denies                         [facility] Adult PT Treatment/Exercise - 06/09/16 1610      Transfers   Transfers Sit to Stand;Stand to Sit   Sit to Stand 4: Min guard   Sit to Stand Details (indicate cue type and reason) x5; closeguarding due to unsteadiness, no overt LOB; pt recalled proper sequencing for transitioning hands to/from RW; heavy use of LUE to boost to stand   Stand to Sit 4: Min guard   Stand to Sit Details controlled descent with LUE assist     Ambulation/Gait   Ambulation/Gait Assistance 4: Min guard   Ambulation/Gait Assistance Details minguard due to unsteadiness and high fall risk   Ambulation Distance (Feet) 110 Feet  x 3   Assistive device Rolling walker   Gait Pattern Step-through pattern;Decreased step length - right;Decreased step length - left;Decreased dorsiflexion - right;Decreased dorsiflexion - left;Right foot flat;Left foot flat;Trunk flexed;Poor foot clearance -  left;Poor foot clearance - right   Ambulation Surface Level;Indoor   Stairs Yes   Stairs Assistance 4: Min assist   Stairs Assistance Details (indicate cue type and reason) hands-on assist 100% of time due to high fall risk   Stair Management Technique One rail Left;Step to pattern;Forwards   Number of Stairs 4   Height of Stairs 6   Gait Comments in // bars ambulated x 10 ft with no UE support and min assist; pt with LOB x 2 due to dragging RLE     Neuro Re-ed    Neuro Re-ed Details  standing on blue foam in // bars, feet shoulder width; able to maintain x 60 seconds EO, no UE support with very little sway; with EC, no UE support pt slowly leaning backwards with opening eyes and  grabbing Lt bar after 6 seconds     Exercises   Exercises Ankle;Knee/Hip     Knee/Hip Exercises: Standing   Forward Step Up Both;5 reps;Hand Hold: 1;Step Height: 6"  Lt hand on rail; HHA on Rt by PT   Forward Step Up Limitations incr reliance on UEs when RLE ascend/descend      Ankle Exercises: Stretches   Soleus Stretch 3 reps;30 seconds   Soleus Stretch Limitations bil in sitting with overpressure (hands on knees)     Ankle Exercises: Standing   Heel Raises 10 reps   Heel Raises Limitations bil UE support   Toe Raise Limitations   Toe Raise Limitations attempted and pt unable to clear toes of either foot with noted heel cord tightness   Toe Walk (Round Trip) x 6 ft along counter with single UE and min assist for balance; poor ability to stay on toes due to weakness and decr balance                PT Education - 06/09/16 1628    Education provided Yes   Education Details added seated dorsiflexion stretch to HEP   Person(s) Educated Patient;Spouse   Methods Explanation;Demonstration;Handout   Comprehension Verbalized understanding;Returned demonstration          PT Short Term Goals - 06/01/16 1656      PT SHORT TERM GOAL #1   Title Pt will perform HEP with family's supervision, to address balance, gait, transfers and strengthening.  TARGET 07/01/16   Time 5   Period Weeks   Status New     PT SHORT TERM GOAL #2   Title Pt will improve Berg Balance score to at least 26/56 for decreased fall risk.   Time 5   Period Weeks   Status New     PT SHORT TERM GOAL #3   Title Pt will improve TUG score to less than or equal to 50 seconds for decreased fall risk.   Time 5   Period Weeks   Status New     PT SHORT TERM GOAL #4   Title Pt will transfer sit<>stand, modified independently, at least 4 of 5 trials, to demo improved lower extremity strength and transfer efficiency.   Time 5   Period Weeks   Status New     PT SHORT TERM GOAL #5   Title Pt will ambulate at  least 200 ft using RW, supervision, for improved gait safety and efficiency.   Time 5   Period Weeks   Status New     Additional Short Term Goals   Additional Short Term Goals Yes     PT SHORT TERM GOAL #6  Title Pt/family will verbalize understanding of CVA education.   Time 5   Period Weeks   Status New           PT Long Term Goals - 06/01/16 2053      PT LONG TERM GOAL #1   Title Pt/family will verbalize understanding of fall prevention in the home environment.  TARGET 07/31/16   Time 9   Period Weeks   Status New     PT LONG TERM GOAL #2   Title Pt will improve Berg Balance score to at least 31/56 for decreased fall risk.   Time 9   Period Weeks   Status New     PT LONG TERM GOAL #3   Title Pt will improve TUG score to less than or equal to 45 seconds for decreased fall risk.   Time 9   Period Weeks   Status New     PT LONG TERM GOAL #4   Title Pt will improve gait velocity to at least 1.8 ft/sec for improved gait efficiency and safety.   Time 9   Period Weeks   Status New     PT LONG TERM GOAL #5   Title Pt will ambulate at least 800 ft indoor and outdoor surfaces, using RW, modified independently, for improved outdoor and community.   Time 9   Period Weeks   Status New     Additional Long Term Goals   Additional Long Term Goals Yes     PT LONG TERM GOAL #6   Title Pt will improve Neuro QOL score on FOTO by at least 20% for improved functional mobility.   Time 9   Period Weeks   Status New               Plan - 06/09/16 1629    Clinical Impression Statement Patient demonstrated improved bil foot clearance and safe use of RW during gait. Educated on proper height for walker to improve safe use and pt/husband plan to bring her RW to next visit to be assessed (pt thinks her's is too tall after education today). Remains very unsteady without RW with 2 losses of balance due to Rt foot dragging in 10 ft. Continue to focus on improving safe ambulation  with RW, balance, strength, and safety awareness. (Patient when asked stated her walking without the RW was "good.")   Rehab Potential Good   PT Frequency 2x / week   PT Duration 2 weeks   PT Treatment/Interventions ADLs/Self Care Home Management;Functional mobility training;Stair training;Gait training;Patient/family education;Neuromuscular re-education;Balance training;Therapeutic exercise;Therapeutic activities;DME Instruction   PT Next Visit Plan check ?'s on DF stretch; husband to bring her RW and ?splint/grip she can use for Rt hand on RW (OT concerned re: flexor tone in hand worsening with constant gripping RW); balance   PT Home Exercise Plan son Freida Busman(Allen Pontarelli, PT) gave standing ex's at counter; pelvic tilts, bridging, seated bil ankle DF stretch   Consulted and Agree with Plan of Care Patient;Family member/caregiver   Family Member Consulted Husband      Patient will benefit from skilled therapeutic intervention in order to improve the following deficits and impairments:  Abnormal gait, Decreased balance, Decreased cognition, Decreased knowledge of use of DME, Decreased range of motion, Decreased safety awareness, Decreased strength  Visit Diagnosis: Hemiplegia and hemiparesis following cerebral infarction affecting right dominant side (HCC)  Other abnormalities of gait and mobility     Problem List Patient Active Problem List   Diagnosis Date  Noted  . Slow transit constipation   . Acute blood loss anemia   . Adjustment disorder with depressed mood   . Cerebrovascular accident (CVA) (HCC) 02/03/2016  . Dysarthria, post-stroke   . Dysphagia, post-stroke   . Leukocytosis   . Prediabetes   . Right hemiparesis (HCC)   . Cerebral infarction due to unspecified mechanism   . Other secondary hypertension   . Overactive bladder   . Diastolic dysfunction   . Hyperlipidemia   . Essential hypertension   . Essential tremor   . Stroke (cerebrum) (HCC) 02/01/2016  . Facial droop  due to stroke 02/01/2016  . Essential and other specified forms of tremor 02/20/2013  . Dysphonia 02/20/2013  . Trigeminal neuralgia 02/20/2013    Scherrie November Delmer Kowalski. PT 06/09/2016, 4:51 PM  [facility] [address] [place], [place], [address] Phone: [phone]   Fax:  [phone]  Name: Kelly [name] MRN: [id number] Date of Birth: November 25, 1927

## 2016-06-09 NOTE — Patient Instructions (Signed)
Dorsiflexion: Self-Mobilization (Sitting)    Feet flat, other foot forward, slide left foot back until gentle stretch is felt. Keep entire foot on floor. Hold __30__ seconds. Relax. Repeat ___3_ times per set. Do __1__ sets per session. Do __1-2__ sessions per day.  http://orth.exer.us/83   Copyright  VHI. All rights reserved.

## 2016-06-11 ENCOUNTER — Ambulatory Visit: Payer: Medicare Other | Admitting: Physical Therapy

## 2016-06-11 ENCOUNTER — Encounter: Payer: Medicare Other | Admitting: Occupational Therapy

## 2016-06-16 ENCOUNTER — Ambulatory Visit: Payer: Medicare Other | Admitting: Physical Therapy

## 2016-06-16 ENCOUNTER — Ambulatory Visit: Payer: Medicare Other | Admitting: Occupational Therapy

## 2016-06-16 DIAGNOSIS — R2689 Other abnormalities of gait and mobility: Secondary | ICD-10-CM

## 2016-06-16 DIAGNOSIS — R2681 Unsteadiness on feet: Secondary | ICD-10-CM | POA: Diagnosis not present

## 2016-06-16 DIAGNOSIS — R278 Other lack of coordination: Secondary | ICD-10-CM

## 2016-06-16 DIAGNOSIS — I69351 Hemiplegia and hemiparesis following cerebral infarction affecting right dominant side: Secondary | ICD-10-CM

## 2016-06-16 DIAGNOSIS — M6281 Muscle weakness (generalized): Secondary | ICD-10-CM

## 2016-06-16 DIAGNOSIS — R29818 Other symptoms and signs involving the nervous system: Secondary | ICD-10-CM | POA: Diagnosis not present

## 2016-06-16 NOTE — Therapy (Signed)
[facility] [address] [place], [place], [address] Phone: [phone]   Fax:  [phone]  Physical Therapy Treatment  Patient Details  Name: Kelly HumphreysBennie H [name] MRN: [id number] Date of Birth: 1927-07-17 Referring Provider: Darrol AngelMartin, Carolyn  Encounter Date: 06/16/2016      PT End of Session - 06/16/16 2159    Visit Number 4   Number of Visits 18   Date for PT Re-Evaluation 07/31/16   Authorization Type Medicare/[employer]-[place] every 10th visit   PT Start Time 1445   PT Stop Time 1533   PT Time Calculation (min) 48 min   Equipment Utilized During Treatment Gait belt   Activity Tolerance Patient tolerated treatment well   Behavior During Therapy [facility] for tasks assessed/performed      Past Medical History:  Diagnosis Date  . Acute blood loss anemia   . Adjustment disorder with depressed mood   . CVA (cerebral vascular accident) (HCC) 02/03/2016   Linear infarct within the posterior limb of left internal capsule  . Diastolic CHF (HCC)   . Dysphagia   . Dysphonia 02/20/2013  . Essential and other specified forms of tremor 02/20/2013  . HLD (hyperlipidemia)   . HTN (hypertension)   . OAB (overactive bladder)   . Osteoporosis   . Patient receiving subcutaneous heparin    For DVT prophylaxis 9/17  . Prediabetes   . Psoriasis   . Slow transit constipation   . Stroke (cerebrum) (HCC) 02/01/2016  . Trigeminal neuralgia 02/20/2013    Past Surgical History:  Procedure Laterality Date  . APPENDECTOMY  1940  . bladder tack    . CATARACT EXTRACTION Bilateral   . HEMORRHOID SURGERY  1960  . HUMERUS FRACTURE SURGERY    . LAPAROSCOPIC HYSTERECTOMY  2006  . torn rotator cuff    . WRIST FRACTURE SURGERY  2005   seconday shoulder 2001    There were no vitals filed for this visit.      Subjective Assessment - 06/16/16 2133    Subjective Patient reports her home had frozen and busted pipes last week which caused the  wooden boards in the hallway and leading into her bathroom to buckle upwards. Pt/husband report that they worked on a way for patient to walk into bathroom as patient is unable to roll her wheelchair over the buckled boards.    Patient is accompained by: Family member  husband   Patient Stated Goals Pt's goal for therapy is to walk again.   Currently in Pain? No/denies                         [facility] Treatment/Exercise - 06/16/16 2136      Transfers   Transfers Sit to Stand;Stand to Sit   Sit to Stand 4: Min guard   Sit to Stand Details Tactile cues for weight shifting;Tactile cues for posture;Visual cues for safe use of DME/AE;Verbal cues for technique;Verbal cues for precautions/safety;Verbal cues for safe use of DME/AE   Sit to Stand Details (indicate cue type and reason) x 6 during session; max vc for sequencing/safe use of RW; facilitation for forward weight-shift   Stand to Sit 4: Min guard   Stand to Sit Details (indicate cue type and reason) Tactile cues for sequencing;Visual cues for safe use of DME/AE;Verbal cues for technique;Verbal cues for safe use of DME/AE   Stand to Sit Details max vc for proper use of RW  Ambulation/Gait   Ambulation/Gait Assistance 4: Min guard   Ambulation/Gait Assistance Details Assisted with Rt hand positioning with Rt hand grip added to the walker. Verbal cues for increasing heelsstrike. VC in safe use of RW   Ambulation Distance (Feet) 150 Feet  seated rest, 110   Assistive device Rolling walker   Gait Pattern Step-through pattern;Decreased step length - right;Decreased step length - left;Decreased dorsiflexion - right;Decreased dorsiflexion - left;Right foot flat;Left foot flat;Trunk flexed;Poor foot clearance - left;Poor foot clearance - right   Ambulation Surface Level;Indoor   Stairs Yes   Stairs Assistance 4: Min guard   Stairs Assistance Details (indicate cue type and reason) constant minguard due to high fall risk    Stair Management Technique One rail Right;Alternating pattern;Forwards   Number of Stairs 8   Height of Stairs 6   Pre-Gait Activities sitting at EOB (or chair); practiced swinging each foot forward (essentially LAQ) follwed by placing heel down first.   Gait Comments pt with difficulty improving her heelstrike with verbal and visual cues only; giat improved after activity in sitting to simulate swing phase and initial heel strike; pt's RW noted to be ~2.5 inches too tall for her. Husband reports they borrowed RW from family and pt has not had an assistive device purchased for her through her insurance. Practiced with RW of appropriate height (owned by clinic) and pt with improved safety and upright posture. Simulated buckled flooring in her hallway and at entrance to her bathroom (pt pivots RW on one back leg until she is facing buckled boards to step over. She is able to lift back legs of RW over obstacle and then step each foot across the obstacle. Repeated simulation x 3 with husband cuing patient on the technique. Instructed husband that patient is at high risk for falling during this process and he should stand by patient to assist her if she loses her balance.      Ankle Exercises: Stretches   Soleus Stretch 3 reps;30 seconds   Soleus Stretch Limitations bil in sitting with overpressure (hands on knees)                PT Education - 06/16/16 2157    Education provided Yes   Education Details Reinforced need for husband to accompany patient any time she is walking with RW (especially now that frozen/burst pipes have caused floor boards to buckle upwards). Proper fit of RW and recommended pursuing prescription from MD to obtain RW of proper fit for pt.   Person(s) Educated Patient;Spouse   Methods Explanation;Demonstration   Comprehension Verbalized understanding;Need further instruction          PT Short Term Goals - 06/01/16 1656      PT SHORT TERM GOAL #1   Title Pt will  perform HEP with family's supervision, to address balance, gait, transfers and strengthening.  TARGET 07/01/16   Time 5   Period Weeks   Status New     PT SHORT TERM GOAL #2   Title Pt will improve Berg Balance score to at least 26/56 for decreased fall risk.   Time 5   Period Weeks   Status New     PT SHORT TERM GOAL #3   Title Pt will improve TUG score to less than or equal to 50 seconds for decreased fall risk.   Time 5   Period Weeks   Status New     PT SHORT TERM GOAL #4   Title Pt will transfer sit<>stand,  modified independently, at least 4 of 5 trials, to demo improved lower extremity strength and transfer efficiency.   Time 5   Period Weeks   Status New     PT SHORT TERM GOAL #5   Title Pt will ambulate at least 200 ft using RW, supervision, for improved gait safety and efficiency.   Time 5   Period Weeks   Status New     Additional Short Term Goals   Additional Short Term Goals Yes     PT SHORT TERM GOAL #6   Title Pt/family will verbalize understanding of CVA education.   Time 5   Period Weeks   Status New           PT Long Term Goals - 06/01/16 2053      PT LONG TERM GOAL #1   Title Pt/family will verbalize understanding of fall prevention in the home environment.  TARGET 07/31/16   Time 9   Period Weeks   Status New     PT LONG TERM GOAL #2   Title Pt will improve Berg Balance score to at least 31/56 for decreased fall risk.   Time 9   Period Weeks   Status New     PT LONG TERM GOAL #3   Title Pt will improve TUG score to less than or equal to 45 seconds for decreased fall risk.   Time 9   Period Weeks   Status New     PT LONG TERM GOAL #4   Title Pt will improve gait velocity to at least 1.8 ft/sec for improved gait efficiency and safety.   Time 9   Period Weeks   Status New     PT LONG TERM GOAL #5   Title Pt will ambulate at least 800 ft indoor and outdoor surfaces, using RW, modified independently, for improved outdoor and community.    Time 9   Period Weeks   Status New     Additional Long Term Goals   Additional Long Term Goals Yes     PT LONG TERM GOAL #6   Title Pt will improve Neuro QOL score on FOTO by at least 20% for improved functional mobility.   Time 9   Period Weeks   Status New               Plan - 06/16/16 2201    Clinical Impression Statement Continue to focus on safety with gait, including education on proper fit and use of RW. Patient brought her RW in to clinic and is indeed too tall for her (she borrowed RW). She does own proper RW hand splint that fits over hand grip and maintains finger extension and more open palm/thumb adduction while gripping. OT also assessed and agreed it is appropriate for pateint to use.    Rehab Potential Good   PT Frequency 2x / week   PT Duration 2 weeks   PT Treatment/Interventions ADLs/Self Care Home Management;Functional mobility training;Stair training;Gait training;Patient/family education;Neuromuscular re-education;Balance training;Therapeutic exercise;Therapeutic activities;DME Instruction   PT Next Visit Plan follow-up if MD has sent order for RW so we can issue youth RW; safe use of RW during transfers/gait; balance activities and add balance ex's to HEP; bil LE strengthening; improving pt & husband's safety awareness (? create planned failures to reinforce need for husband to be close by when pt walking with RW)   PT Home Exercise Plan son Freida Busman Gage, PT) gave standing ex's at counter; pelvic tilts, bridging, seated bil  ankle DF stretch   Consulted and Agree with Plan of Care Patient;Family member/caregiver   Family Member Consulted Husband      Patient will benefit from skilled therapeutic intervention in order to improve the following deficits and impairments:  Abnormal gait, Decreased balance, Decreased cognition, Decreased knowledge of use of DME, Decreased range of motion, Decreased safety awareness, Decreased strength  Visit  Diagnosis: Hemiplegia and hemiparesis following cerebral infarction affecting right dominant side (HCC)  Other abnormalities of gait and mobility  Muscle weakness (generalized)     Problem List Patient Active Problem List   Diagnosis Date Noted  . Slow transit constipation   . Acute blood loss anemia   . Adjustment disorder with depressed mood   . Cerebrovascular accident (CVA) (HCC) 02/03/2016  . Dysarthria, post-stroke   . Dysphagia, post-stroke   . Leukocytosis   . Prediabetes   . Right hemiparesis (HCC)   . Cerebral infarction due to unspecified mechanism   . Other secondary hypertension   . Overactive bladder   . Diastolic dysfunction   . Hyperlipidemia   . Essential hypertension   . Essential tremor   . Stroke (cerebrum) (HCC) 02/01/2016  . Facial droop due to stroke 02/01/2016  . Essential and other specified forms of tremor 02/20/2013  . Dysphonia 02/20/2013  . Trigeminal neuralgia 02/20/2013    Zena Amos, PT 06/16/2016, 10:14 PM  [facility] [address] [place], [place], [address] Phone: [phone]   Fax:  [phone]  Name: Kelly [name] MRN: [id number] Date of Birth: 1928-05-18

## 2016-06-16 NOTE — Therapy (Signed)
[facility] [address] [place], [place], [address] Phone: [phone]   Fax:  [phone]  Occupational Therapy Treatment  Patient Details  Name: Kelly HumphreysBennie H Rathgeber MRN: [id number] Date of Birth: 08/17/1927 Referring Provider: Darrol Angelarolyn Martin, NP  Encounter Date: 06/16/2016    Past Medical History:  Diagnosis Date  . Acute blood loss anemia   . Adjustment disorder with depressed mood   . CVA (cerebral vascular accident) (HCC) 02/03/2016   Linear infarct within the posterior limb of left internal capsule  . Diastolic CHF (HCC)   . Dysphagia   . Dysphonia 02/20/2013  . Essential and other specified forms of tremor 02/20/2013  . HLD (hyperlipidemia)   . HTN (hypertension)   . OAB (overactive bladder)   . Osteoporosis   . Patient receiving subcutaneous heparin    For DVT prophylaxis 9/17  . Prediabetes   . Psoriasis   . Slow transit constipation   . Stroke (cerebrum) (HCC) 02/01/2016  . Trigeminal neuralgia 02/20/2013    Past Surgical History:  Procedure Laterality Date  . APPENDECTOMY  1940  . bladder tack    . CATARACT EXTRACTION Bilateral   . HEMORRHOID SURGERY  1960  . HUMERUS FRACTURE SURGERY    . LAPAROSCOPIC HYSTERECTOMY  2006  . torn rotator cuff    . WRIST FRACTURE SURGERY  2005   seconday shoulder 2001    There were no vitals filed for this visit.      Subjective Assessment - 06/16/16 1557    Subjective  Denies pain   Pertinent History adjustment disorder with depressed mood, diastolic CHF, osteoporosis, hx of fall, essential tremor, HTN, prediabetes, trigeminal neuralgia   Patient Stated Goals be able to write, fold clothes, do dishes   Currently in Pain? No/denies           Treatment: Therapist assessed use of walker splint for improved right hand positioning and encouraged pt to use. Therapist initiated fabrication of a resting hand splint for improved RUE positioning. Therapist did  not issue due to time constraints.                     OT Short Term Goals - 06/09/16 1632      OT SHORT TERM GOAL #1   Title Pt will be independent with initial HEP--check STGs 06/30/16   Time 4   Period Weeks   Status New     OT SHORT TERM GOAL #2   Title Pt will be able to write name with at least 75% legibility.   Time 4   Period Weeks   Status New     OT SHORT TERM GOAL #3   Title Pt will demo at least 95* R shoulder flex with min compensation in prep for functional reach.   Time 4   Period Weeks   Status New     OT SHORT TERM GOAL #4   Title Pt will improve functional reaching and grasp/release as shown by improving score on box and blocks by at least 5.   Baseline 3 blocks   Time 4   Period Weeks   Status New           OT Long Term Goals - 06/09/16 1632      OT LONG TERM GOAL #1   Title Pt will be independent with updated HEP--07/30/16   Time 8   Period Weeks   Status New     OT LONG TERM GOAL #2  Title Pt will be able to write name with at least 95% legibility and simple sentence with at least 75% legibility.   Time 8   Period Weeks   Status New     OT LONG TERM GOAL #3   Title Pt will demo at least 100* R shoulder flex with min compensation in prep for functional reach.   Period Weeks   Status New     OT LONG TERM GOAL #4   Title Pt will improve functional reaching and grasp/release as shown by improving score on box and blocks by at least 10.   Baseline 3 blocks   Time 8   Period Weeks   Status New     OT LONG TERM GOAL #5   Title Pt will be able to use dominant RUE for grooming tasks at least 75% of the time.   Time 8   Period Weeks   Status New     OT LONG TERM GOAL #6   Title Pt will perform simple home maintenance tasks with supervision.   Time 8   Period Weeks   Status New               Plan - 06/16/16 1555    Clinical Impression Statement therapsit initated fabrication of resting hand splint as pt's prefab  splint does not fit well.   Rehab Potential Good   OT Treatment/Interventions Self-care/ADL training;Therapeutic exercise;DME and/or AE instruction;Building services engineer;Therapeutic activities;Patient/family education;Balance training;Neuromuscular education;Electrical Stimulation;Moist Heat;Energy conservation;Passive range of motion;Therapeutic exercises;Splinting;Manual Therapy;Fluidtherapy;Ultrasound;Cryotherapy;Parrafin   Plan complete splint and issue next visit   Consulted and Agree with Plan of Care Patient;Family member/caregiver   Family Member Consulted husband      Patient will benefit from skilled therapeutic intervention in order to improve the following deficits and impairments:  Decreased coordination, Decreased activity tolerance, Decreased knowledge of use of DME, Decreased strength, Impaired UE functional use, Impaired tone, Decreased range of motion, Decreased balance, Decreased mobility, Difficulty walking  Visit Diagnosis: Hemiplegia and hemiparesis following cerebral infarction affecting right dominant side (HCC)  Other symptoms and signs involving the nervous system  Other lack of coordination  Muscle weakness (generalized)    Problem List Patient Active Problem List   Diagnosis Date Noted  . Slow transit constipation   . Acute blood loss anemia   . Adjustment disorder with depressed mood   . Cerebrovascular accident (CVA) (HCC) 02/03/2016  . Dysarthria, post-stroke   . Dysphagia, post-stroke   . Leukocytosis   . Prediabetes   . Right hemiparesis (HCC)   . Cerebral infarction due to unspecified mechanism   . Other secondary hypertension   . Overactive bladder   . Diastolic dysfunction   . Hyperlipidemia   . Essential hypertension   . Essential tremor   . Stroke (cerebrum) (HCC) 02/01/2016  . Facial droop due to stroke 02/01/2016  . Essential and other specified forms of tremor 02/20/2013  . Dysphonia 02/20/2013  . Trigeminal neuralgia  02/20/2013    Kelena Garrow 06/16/2016, 3:57 PM  [facility] [address] [place], [place], [address] Phone: [phone]   Fax:  [phone]  Name: CHALEY CASTELLANOS MRN: [id number] Date of Birth: 08/01/1927

## 2016-06-17 ENCOUNTER — Telehealth: Payer: Self-pay | Admitting: Physical Therapy

## 2016-06-17 NOTE — Telephone Encounter (Signed)
Dr. Wylene Simmerisovec, Thank you for referring Yatzary Kochanski to Physical Therapy. Please order a "youth rolling walker" for her (enter order either in Epic or may fax to [facility] [phone] attn: Veda CanningLynn Darria Corvera).  As you know, she is a very high fall risk and has been borrowing a RW that is too tall for her.   Many thanks,  Veda CanningLynn Demichael Traum, PT

## 2016-06-18 ENCOUNTER — Encounter: Payer: Self-pay | Admitting: Physical Medicine & Rehabilitation

## 2016-06-18 ENCOUNTER — Encounter (HOSPITAL_BASED_OUTPATIENT_CLINIC_OR_DEPARTMENT_OTHER): Payer: Medicare Other | Admitting: Physical Medicine & Rehabilitation

## 2016-06-18 VITALS — BP 123/68 | HR 70

## 2016-06-18 DIAGNOSIS — I69351 Hemiplegia and hemiparesis following cerebral infarction affecting right dominant side: Secondary | ICD-10-CM | POA: Diagnosis not present

## 2016-06-18 DIAGNOSIS — I110 Hypertensive heart disease with heart failure: Secondary | ICD-10-CM | POA: Diagnosis not present

## 2016-06-18 DIAGNOSIS — E785 Hyperlipidemia, unspecified: Secondary | ICD-10-CM | POA: Diagnosis not present

## 2016-06-18 DIAGNOSIS — F4321 Adjustment disorder with depressed mood: Secondary | ICD-10-CM | POA: Diagnosis not present

## 2016-06-18 DIAGNOSIS — N3281 Overactive bladder: Secondary | ICD-10-CM | POA: Diagnosis not present

## 2016-06-18 DIAGNOSIS — G8111 Spastic hemiplegia affecting right dominant side: Secondary | ICD-10-CM | POA: Diagnosis not present

## 2016-06-18 DIAGNOSIS — G250 Essential tremor: Secondary | ICD-10-CM | POA: Diagnosis not present

## 2016-06-18 DIAGNOSIS — I639 Cerebral infarction, unspecified: Secondary | ICD-10-CM

## 2016-06-18 DIAGNOSIS — IMO0002 Reserved for concepts with insufficient information to code with codable children: Secondary | ICD-10-CM

## 2016-06-18 NOTE — Progress Notes (Signed)
Botox: Procedure Note Patient Name: Kelly HumphreysBennie H Grein DOB: Sep 07, 1927 MRN: [id number]   Procedure: Botulinum toxin administration Guidance: EMG Diagnosis: G81.11 Date: 06/18/16 Attending: Maryla MorrowAnkit Ryanna Teschner, MD    Trade name: Botox (onabotulinumtoxinA)  Informed consent: Risks, benefits & options of the procedure are explained to the patient (and/or family). The patient elects to proceed with procedure. Risks include but are not limited to weakness, respiratory distress, dry mouth, ptosis, antibody formation, worsening of some areas of function. Benefits include decreased abnormal muscle tone, improved hygiene and positioning, decreased skin breakdown and, in some cases, decreased pain. Options include conservative management with oral antispasticity agents, phenol chemodenervation of nerve or at motor nerve branches. More invasive options include intrathecal balcofen adminstration for appropriate candidates. Surgical options may include tendon lengthening or transposition or, rarely, dorsal rhizotomy.   History/Physical Examination: 83 y.o. spastic hemiplegia affecting right dominant side after left internal capsule CVA.  mAS  Right elbow flexors 1/4   Right wrist flexors 2/4   Right finger flexors 2/4 Previous Treatments: Failed Baclofen.  Therapy/Range of motion Indication for guidance: Target active muscules  Procedure: Botulinum toxin was mixed with preservative free saline with a dilution of 1cc to 100 units. Targeted limb and muscles were identified. The skin was prepped with alcohol swabs and placement of needle tip in targeted muscle was confirmed using appropriate guidance. Prior to injection, positioning of needle tip outside of blood vessel was determined by pulling back on syringe plunger.  MUSCLE UNITS Right FDS 30U Right FCR 20U Right FDP 20U  Total units used: 70U 30U discarded due to unavoidable waste  Complications: None Plan: Follow up in 6 weeks for follow up and in 3  months for repeat botox  Teodora Baumgarten Karis Jubanil Rose-Marie Hickling 06/18/16 11:40 AM

## 2016-06-19 ENCOUNTER — Ambulatory Visit: Payer: Medicare Other | Admitting: Physical Therapy

## 2016-06-19 ENCOUNTER — Ambulatory Visit: Payer: Medicare Other | Admitting: Occupational Therapy

## 2016-06-19 ENCOUNTER — Telehealth: Payer: Self-pay | Admitting: Physical Therapy

## 2016-06-19 DIAGNOSIS — I69351 Hemiplegia and hemiparesis following cerebral infarction affecting right dominant side: Secondary | ICD-10-CM | POA: Diagnosis not present

## 2016-06-19 DIAGNOSIS — R2689 Other abnormalities of gait and mobility: Secondary | ICD-10-CM | POA: Diagnosis not present

## 2016-06-19 DIAGNOSIS — M6281 Muscle weakness (generalized): Secondary | ICD-10-CM

## 2016-06-19 DIAGNOSIS — R29818 Other symptoms and signs involving the nervous system: Secondary | ICD-10-CM

## 2016-06-19 DIAGNOSIS — R2681 Unsteadiness on feet: Secondary | ICD-10-CM | POA: Diagnosis not present

## 2016-06-19 DIAGNOSIS — R278 Other lack of coordination: Secondary | ICD-10-CM | POA: Diagnosis not present

## 2016-06-19 NOTE — Addendum Note (Signed)
Addended by: Angela NevinWESSLING, Huckleberry Martinson D on: 06/19/2016 03:42 PM   Modules accepted: Orders

## 2016-06-19 NOTE — Therapy (Signed)
[facility] [address] [place], [place], [address] Phone: [phone]   Fax:  [phone]  Occupational Therapy Treatment  Patient Details  Name: Kelly [name] MRN: [id number] Date of Birth: March 10, 1928 Referring Provider: Darrol Angel, NP  Encounter Date: 06/19/2016      OT End of Session - 06/19/16 1416    Visit Number 4   Number of Visits 17   Date for OT Re-Evaluation 07/29/16   Authorization Type Medicare / [employer] (g-code needed)   Authorization - Visit Number 4   Authorization - Number of Visits 10   OT Start Time 1318   OT Stop Time 1400   OT Time Calculation (min) 42 min   Activity Tolerance Patient tolerated treatment well   Behavior During Therapy [facility] for tasks assessed/performed      Past Medical History:  Diagnosis Date  . Acute blood loss anemia   . Adjustment disorder with depressed mood   . CVA (cerebral vascular accident) (HCC) 02/03/2016   Linear infarct within the posterior limb of left internal capsule  . Diastolic CHF (HCC)   . Dysphagia   . Dysphonia 02/20/2013  . Essential and other specified forms of tremor 02/20/2013  . HLD (hyperlipidemia)   . HTN (hypertension)   . OAB (overactive bladder)   . Osteoporosis   . Patient receiving subcutaneous heparin    For DVT prophylaxis 9/17  . Prediabetes   . Psoriasis   . Slow transit constipation   . Stroke (cerebrum) (HCC) 02/01/2016  . Trigeminal neuralgia 02/20/2013    Past Surgical History:  Procedure Laterality Date  . APPENDECTOMY  1940  . bladder tack    . CATARACT EXTRACTION Bilateral   . HEMORRHOID SURGERY  1960  . HUMERUS FRACTURE SURGERY    . LAPAROSCOPIC HYSTERECTOMY  2006  . torn rotator cuff    . WRIST FRACTURE SURGERY  2005   seconday shoulder 2001    There were no vitals filed for this visit.           Placing large pegs in pegboard with RUE, mod difficulty/ v.c. Flipping playing cards with LUE,  min-mod difficulty/ v.c. Tracing circles of various sizes, using highlighter, mod difficulty, v.c.                 OT Education - 06/19/16 1415    Education provided Yes   Education Details splint wear, care and precautions   Person(s) Educated Patient;Spouse   Methods Explanation;Demonstration;Verbal cues;Handout   Comprehension Verbalized understanding;Returned demonstration;Verbal cues required          OT Short Term Goals - 06/09/16 1632      OT SHORT TERM GOAL #1   Title Pt will be independent with initial HEP--check STGs 06/30/16   Time 4   Period Weeks   Status New     OT SHORT TERM GOAL #2   Title Pt will be able to write name with at least 75% legibility.   Time 4   Period Weeks   Status New     OT SHORT TERM GOAL #3   Title Pt will demo at least 95* R shoulder flex with min compensation in prep for functional reach.   Time 4   Period Weeks   Status New     OT SHORT TERM GOAL #4   Title Pt will improve functional reaching and grasp/release as shown by improving score on box and blocks by at least 5.   Baseline 3  blocks   Time 4   Period Weeks   Status New           OT Long Term Goals - 06/09/16 1632      OT LONG TERM GOAL #1   Title Pt will be independent with updated HEP--07/30/16   Time 8   Period Weeks   Status New     OT LONG TERM GOAL #2   Title Pt will be able to write name with at least 95% legibility and simple sentence with at least 75% legibility.   Time 8   Period Weeks   Status New     OT LONG TERM GOAL #3   Title Pt will demo at least 100* R shoulder flex with min compensation in prep for functional reach.   Period Weeks   Status New     OT LONG TERM GOAL #4   Title Pt will improve functional reaching and grasp/release as shown by improving score on box and blocks by at least 10.   Baseline 3 blocks   Time 8   Period Weeks   Status New     OT LONG TERM GOAL #5   Title Pt will be able to use dominant RUE for  grooming tasks at least 75% of the time.   Time 8   Period Weeks   Status New     OT LONG TERM GOAL #6   Title Pt will perform simple home maintenance tasks with supervision.   Time 8   Period Weeks   Status New               Plan - 06/19/16 1414    Clinical Impression Statement Therapist finished fabrication and fitting of pt's resting hand splint. Pt/ husband were educated in wear/care.   Rehab Potential Good   OT Frequency 2x / week   OT Duration 8 weeks   OT Treatment/Interventions Self-care/ADL training;Therapeutic exercise;DME and/or AE instruction;Building services engineer;Therapeutic activities;Patient/family education;Balance training;Neuromuscular education;Electrical Stimulation;Moist Heat;Energy conservation;Passive range of motion;Therapeutic exercises;Splinting;Manual Therapy;Fluidtherapy;Ultrasound;Cryotherapy;Parrafin   Plan splint check, NMR   Consulted and Agree with Plan of Care Patient;Family member/caregiver      Patient will benefit from skilled therapeutic intervention in order to improve the following deficits and impairments:  Decreased coordination, Decreased activity tolerance, Decreased knowledge of use of DME, Decreased strength, Impaired UE functional use, Impaired tone, Decreased range of motion, Decreased balance, Decreased mobility, Difficulty walking  Visit Diagnosis: Hemiplegia and hemiparesis following cerebral infarction affecting right dominant side (HCC)  Other symptoms and signs involving the nervous system  Other lack of coordination  Muscle weakness (generalized)    Problem List Patient Active Problem List   Diagnosis Date Noted  . Slow transit constipation   . Acute blood loss anemia   . Adjustment disorder with depressed mood   . Cerebrovascular accident (CVA) (HCC) 02/03/2016  . Dysarthria, post-stroke   . Dysphagia, post-stroke   . Leukocytosis   . Prediabetes   . Right hemiparesis (HCC)   . Cerebral infarction  due to unspecified mechanism   . Other secondary hypertension   . Overactive bladder   . Diastolic dysfunction   . Hyperlipidemia   . Essential hypertension   . Essential tremor   . Stroke (cerebrum) (HCC) 02/01/2016  . Facial droop due to stroke 02/01/2016  . Essential and other specified forms of tremor 02/20/2013  . Dysphonia 02/20/2013  . Trigeminal neuralgia 02/20/2013    Sada Mazzoni 06/19/2016, 2:16 PM Keene Breath, OTR/L [phone]  Phone: [phone] 2:18 PM 01/26/18[facility] [address] [place], [place], [address] Phone: [phone]   Fax:  [phone]  Name: Dan HumphreysBennie H Scarber MRN: [id number] Date of Birth: 06-Oct-1927

## 2016-06-19 NOTE — Telephone Encounter (Signed)
Dr. Allena KatzPatel, I am seeing Mrs Coltrain for OP neuro PT.  I know you are not the referring provider, however you have seen her most recently.We have been utilizing a RW in therapy with good return demo.  Please order a "youth rolling walker" for her (enter order either in Epic or may fax to [facility] [phone] attn: Veda CanningLynn Akin Yi).  As you know, she is a very high fall risk and has been borrowing a RW that is too tall for her.   Many thanks,  Veda CanningLynn Sartaj Hoskin, PT

## 2016-06-19 NOTE — Therapy (Signed)
[facility] [address] [place], [place], [address] Phone: [phone]   Fax:  [phone]  Physical Therapy Treatment  Patient Details  Name: Kelly [name] MRN: [id number] Date of Birth: May 03, 1928 Referring Provider: Darrol Angel  Encounter Date: 06/19/2016      PT End of Session - 06/19/16 1519    Visit Number 5   Number of Visits 18   Date for PT Re-Evaluation 07/31/16   Authorization Type Medicare/[place] [place] every 10th visit   PT Start Time 1403   PT Stop Time 1445   PT Time Calculation (min) 42 min   Equipment Utilized During Treatment Gait belt   Activity Tolerance Patient tolerated treatment well   Behavior During Therapy [facility] for tasks assessed/performed      Past Medical History:  Diagnosis Date  . Acute blood loss anemia   . Adjustment disorder with depressed mood   . CVA (cerebral vascular accident) (HCC) 02/03/2016   Linear infarct within the posterior limb of left internal capsule  . Diastolic CHF (HCC)   . Dysphagia   . Dysphonia 02/20/2013  . Essential and other specified forms of tremor 02/20/2013  . HLD (hyperlipidemia)   . HTN (hypertension)   . OAB (overactive bladder)   . Osteoporosis   . Patient receiving subcutaneous heparin    For DVT prophylaxis 9/17  . Prediabetes   . Psoriasis   . Slow transit constipation   . Stroke (cerebrum) (HCC) 02/01/2016  . Trigeminal neuralgia 02/20/2013    Past Surgical History:  Procedure Laterality Date  . APPENDECTOMY  1940  . bladder tack    . CATARACT EXTRACTION Bilateral   . HEMORRHOID SURGERY  1960  . HUMERUS FRACTURE SURGERY    . LAPAROSCOPIC HYSTERECTOMY  2006  . torn rotator cuff    . WRIST FRACTURE SURGERY  2005   seconday shoulder 2001    There were no vitals filed for this visit.      Subjective Assessment - 06/19/16 1407    Subjective Reports she had her first botox injections 1/25 and she and her husband went to  [employer] to celebrate. She walked into the restaurant and not drive thru. Reports they have one estimate on fixing flooring and waiting to get another. Only other bathroom on the main level is 1/2 bath and unable to get w/c through the door, no grab bar.    Patient is accompained by: Family member  husband   Patient Stated Goals Pt's goal for therapy is to walk again.   Currently in Pain? No/denies                         [facility] Adult PT Treatment/Exercise - 06/19/16 0001      Transfers   Transfers Sit to Stand;Stand to Sit   Sit to Stand 4: Min guard   Sit to Stand Details Verbal cues for technique;Verbal cues for safe use of DME/AE   Sit to Stand Details (indicate cue type and reason) 10 reps with min cues; using bil UE; from mat   Stand to Sit 4: Min guard   Stand to Sit Details (indicate cue type and reason) Visual cues for safe use of DME/AE;Verbal cues for technique;Verbal cues for safe use of DME/AE   Stand to Sit Details min cues for safe sequencing     Ambulation/Gait   Ambulation/Gait Assistance 4: Min guard   Ambulation/Gait Assistance Details pt borrowed clinic youth RW  during session; did not bring Rt hand grip (husband reports "it was loose and we took it off and forgot it today." Education for incr step length, heelstrike, and upright posture   Ambulation Distance (Feet) 200 Feet  75, 150, 75   Assistive device Rolling walker   Gait Pattern Step-through pattern;Decreased step length - right;Decreased step length - left;Decreased dorsiflexion - right;Decreased dorsiflexion - left;Right foot flat;Left foot flat;Trunk flexed;Poor foot clearance - left;Poor foot clearance - right   Ambulation Surface Level;Indoor   Pre-Gait Activities at counter left foot forward, weight shifts back onto RLE, pulling lt foot into DF with heel down, then shift fwd onto LLE; x 10reps with manual facilitation and max cues     Knee/Hip Exercises: Standing   Forward Step Up  Right;Hand Hold: 2;Step Height: 8"  x8 reps; min assist initially up step, progressed minguard   Step Down Right;Hand Hold: 2;Step Height: 8"  x8; RLE lowering for strengthening/control   Gait Training in // bars, no device x 20 ft with pt reverting to very short steps, shuffling pattern with no UE swing/guarded posture     Ankle Exercises: Stretches   Soleus Stretch 3 reps;30 seconds   Soleus Stretch Limitations bil in sitting with overpressure (hands on knees)     Ankle Exercises: Seated   Toe Raise 20 reps  bil; yellow band                PT Education - 06/19/16 1516    Education provided Yes   Education Details Added seated resisted ankle DF for bil LEs to HEP. Educated on continued need for husband to always be with her when walking with RW (especially into her bathroom with current buckled floor boards).   Person(s) Educated Patient;Spouse   Methods Explanation;Demonstration;Handout   Comprehension Verbalized understanding;Returned demonstration;Need further instruction          PT Short Term Goals - 06/01/16 1656      PT SHORT TERM GOAL #1   Title Pt will perform HEP with family's supervision, to address balance, gait, transfers and strengthening.  TARGET 07/01/16   Time 5   Period Weeks   Status New     PT SHORT TERM GOAL #2   Title Pt will improve Berg Balance score to at least 26/56 for decreased fall risk.   Time 5   Period Weeks   Status New     PT SHORT TERM GOAL #3   Title Pt will improve TUG score to less than or equal to 50 seconds for decreased fall risk.   Time 5   Period Weeks   Status New     PT SHORT TERM GOAL #4   Title Pt will transfer sit<>stand, modified independently, at least 4 of 5 trials, to demo improved lower extremity strength and transfer efficiency.   Time 5   Period Weeks   Status New     PT SHORT TERM GOAL #5   Title Pt will ambulate at least 200 ft using RW, supervision, for improved gait safety and efficiency.   Time 5    Period Weeks   Status New     Additional Short Term Goals   Additional Short Term Goals Yes     PT SHORT TERM GOAL #6   Title Pt/family will verbalize understanding of CVA education.   Time 5   Period Weeks   Status New           PT Long Term Goals - 06/01/16  2053      PT LONG TERM GOAL #1   Title Pt/family will verbalize understanding of fall prevention in the home environment.  TARGET 07/31/16   Time 9   Period Weeks   Status New     PT LONG TERM GOAL #2   Title Pt will improve Berg Balance score to at least 31/56 for decreased fall risk.   Time 9   Period Weeks   Status New     PT LONG TERM GOAL #3   Title Pt will improve TUG score to less than or equal to 45 seconds for decreased fall risk.   Time 9   Period Weeks   Status New     PT LONG TERM GOAL #4   Title Pt will improve gait velocity to at least 1.8 ft/sec for improved gait efficiency and safety.   Time 9   Period Weeks   Status New     PT LONG TERM GOAL #5   Title Pt will ambulate at least 800 ft indoor and outdoor surfaces, using RW, modified independently, for improved outdoor and community.   Time 9   Period Weeks   Status New     Additional Long Term Goals   Additional Long Term Goals Yes     PT LONG TERM GOAL #6   Title Pt will improve Neuro QOL score on FOTO by at least 20% for improved functional mobility.   Time 9   Period Weeks   Status New               Plan - 06/19/16 1520    Clinical Impression Statement Worked again with clinic's youth RW with significant improvement in upright posture and overall gait quality when compared to her walking with her RW that is too tall for her. Sent communication to Dr. Wylene Simmer 1/24, attempted call to office this morning (not open), and sent request to Dr. Allena Katz today for order for youth RW. Pt/husband updated on lack of physician order. Will focus on ankle stretching/DF and AROM, pt with improved foot clearance and heelstrike by end of  session.   Rehab Potential Good   PT Frequency 2x / week   PT Duration 2 weeks   PT Treatment/Interventions ADLs/Self Care Home Management;Functional mobility training;Stair training;Gait training;Patient/family education;Neuromuscular re-education;Balance training;Therapeutic exercise;Therapeutic activities;DME Instruction   PT Next Visit Plan follow-up if MD has sent order for RW so we can issue youth RW; safe use of RW during transfers/gait; balance activities and add balance ex's to HEP; bil LE strengthening   PT Home Exercise Plan son Freida Busman Toppin, PT) gave standing ex's at counter; pelvic tilts, bridging, seated bil ankle DF stretch   Consulted and Agree with Plan of Care Patient;Family member/caregiver   Family Member Consulted Husband      Patient will benefit from skilled therapeutic intervention in order to improve the following deficits and impairments:  Abnormal gait, Decreased balance, Decreased cognition, Decreased knowledge of use of DME, Decreased range of motion, Decreased safety awareness, Decreased strength  Visit Diagnosis: Hemiplegia and hemiparesis following cerebral infarction affecting right dominant side (HCC)  Muscle weakness (generalized)  Unsteadiness on feet     Problem List Patient Active Problem List   Diagnosis Date Noted  . Slow transit constipation   . Acute blood loss anemia   . Adjustment disorder with depressed mood   . Cerebrovascular accident (CVA) (HCC) 02/03/2016  . Dysarthria, post-stroke   . Dysphagia, post-stroke   . Leukocytosis   .  Prediabetes   . Right hemiparesis (HCC)   . Cerebral infarction due to unspecified mechanism   . Other secondary hypertension   . Overactive bladder   . Diastolic dysfunction   . Hyperlipidemia   . Essential hypertension   . Essential tremor   . Stroke (cerebrum) (HCC) 02/01/2016  . Facial droop due to stroke 02/01/2016  . Essential and other specified forms of tremor 02/20/2013  . Dysphonia  02/20/2013  . Trigeminal neuralgia 02/20/2013    Zena Amos, PT 06/19/2016, 3:28 PM  [facility] [address] [place], [place], [address] Phone: [phone]   Fax:  [phone]  Name: Kelly [name] MRN: [id number] Date of Birth: 1928-02-24

## 2016-06-19 NOTE — Telephone Encounter (Signed)
Will complete.  Thanks.

## 2016-06-19 NOTE — Patient Instructions (Signed)
ANKLE: Dorsiflexion (Band)    Sit at edge of surface. Place band around front of left foot. Then place ends of band under right heel. Keeping heel on floor, raise toes of banded foot. Hold _2__ seconds. Use ___yellow_____ band. _20__ reps per set, _2__ sets per day, _5__ days per week  Copyright  VHI. All rights reserved.

## 2016-06-19 NOTE — Patient Instructions (Signed)
Your Splint This splint should initially be fitted by a healthcare practitioner.  The healthcare practitioner is responsible for providing wearing instructions and precautions to the patient, other healthcare practitioners and care provider involved in the patient's care.  This splint was custom made for you. Please read the following instructions to learn about wearing and caring for your splint.  Precautions Should your splint cause any of the following problems, remove the splint immediately and contact your therapist/physician.  Swelling  Severe Pain  Pressure Areas  Stiffness  Numbness  Do not wear your splint while operating machinery unless it has been fabricated for that purpose.  When To Wear Your Splint Where your splint according to your therapist/physician instructions. Daytime for 1 hours, then remove and check skin for pressure areas or redness, if no problems you may progress to wearing for 2-3 hrs at a time during the day. Gradually increase wear time so that you can tolerate for 4-5 hrs during daytime. Once this has occurred you may begin wearing the splint at nighttime. Do not wear splint when up walking!  Care and Cleaning of Your Splint 1. Keep your splint away from open flames. 2. Your splint will lose its shape in temperatures over 135 degrees Farenheit, ( in car windows, near radiators, ovens or in hot water).  Never make any adjustments to your splint, if the splint needs adjusting remove it and make an appointment to see your therapist. 3. Your splint, including the cushion liner may be cleaned with soap and lukewarm water or alcohol on a cotton ball.  Do not immerse in hot water over 135 degrees Farenheit. 4. Straps may be washed with soap and water, but do not moisten the self-adhesive portion.

## 2016-06-22 ENCOUNTER — Ambulatory Visit: Payer: Medicare Other | Admitting: Physical Therapy

## 2016-06-22 ENCOUNTER — Ambulatory Visit: Payer: Medicare Other | Admitting: Occupational Therapy

## 2016-06-22 DIAGNOSIS — M6281 Muscle weakness (generalized): Secondary | ICD-10-CM

## 2016-06-22 DIAGNOSIS — I69351 Hemiplegia and hemiparesis following cerebral infarction affecting right dominant side: Secondary | ICD-10-CM

## 2016-06-22 DIAGNOSIS — R2681 Unsteadiness on feet: Secondary | ICD-10-CM | POA: Diagnosis not present

## 2016-06-22 DIAGNOSIS — R278 Other lack of coordination: Secondary | ICD-10-CM | POA: Diagnosis not present

## 2016-06-22 DIAGNOSIS — R2689 Other abnormalities of gait and mobility: Secondary | ICD-10-CM | POA: Diagnosis not present

## 2016-06-22 DIAGNOSIS — R29818 Other symptoms and signs involving the nervous system: Secondary | ICD-10-CM | POA: Diagnosis not present

## 2016-06-22 NOTE — Therapy (Signed)
[facility] [address] [place], [place], [address] Phone: [phone]   Fax:  [phone]  Physical Therapy Treatment  Patient Details  Name: Kelly [name] MRN: [id number] Date of Birth: Jan 07, 1928 Referring Provider: Darrol Angel  Encounter Date: 06/22/2016      PT End of Session - 06/22/16 1556    Visit Number 6   Number of Visits 18   Date for PT Re-Evaluation 07/31/16   Authorization Type Medicare/[employer] [facility] every 10th visit   PT Start Time 1452   PT Stop Time 1531   PT Time Calculation (min) 39 min   Equipment Utilized During Treatment Gait belt   Activity Tolerance Patient tolerated treatment well   Behavior During Therapy [facility] for tasks assessed/performed      Past Medical History:  Diagnosis Date  . Acute blood loss anemia   . Adjustment disorder with depressed mood   . CVA (cerebral vascular accident) (HCC) 02/03/2016   Linear infarct within the posterior limb of left internal capsule  . Diastolic CHF (HCC)   . Dysphagia   . Dysphonia 02/20/2013  . Essential and other specified forms of tremor 02/20/2013  . HLD (hyperlipidemia)   . HTN (hypertension)   . OAB (overactive bladder)   . Osteoporosis   . Patient receiving subcutaneous heparin    For DVT prophylaxis 9/17  . Prediabetes   . Psoriasis   . Slow transit constipation   . Stroke (cerebrum) (HCC) 02/01/2016  . Trigeminal neuralgia 02/20/2013    Past Surgical History:  Procedure Laterality Date  . APPENDECTOMY  1940  . bladder tack    . CATARACT EXTRACTION Bilateral   . HEMORRHOID SURGERY  1960  . HUMERUS FRACTURE SURGERY    . LAPAROSCOPIC HYSTERECTOMY  2006  . torn rotator cuff    . WRIST FRACTURE SURGERY  2005   seconday shoulder 2001    There were no vitals filed for this visit.      Subjective Assessment - 06/22/16 1508    Subjective Reports workers already working on the floor that buckled at entry to bedroom and  bathroom. They took area down to the subfloor, so there is still a height difference in flooring, but is better.    Patient is accompained by: Family member  husband   Patient Stated Goals Pt's goal for therapy is to walk again.   Currently in Pain? No/denies                         [facility] Adult PT Treatment/Exercise - 06/22/16 1547      Transfers   Transfers Sit to Stand;Stand to Sit   Sit to Stand 5: Supervision   Sit to Stand Details (indicate cue type and reason) proper sequencing x 5 reps throughout session   Stand to Sit 4: Min guard   Stand to Sit Details (indicate cue type and reason) Tactile cues for sequencing;Tactile cues for placement;Verbal cues for technique;Verbal cues for safe use of DME/AE   Stand to Sit Details reaching back to surface with Lt hand only (rt remains on RW) and is not fully turned and back to chair when she initiates sitting     Ambulation/Gait   Ambulation/Gait Assistance 4: Min guard   Ambulation/Gait Assistance Details Verbal and light tactile cues for incr step-length bil and incr heelstrike; patient with LEFT toe catching twice, causing imblance with pt able to correct with use of UE support via RW;  noted incr Rt knee recurvatum as distance and fatigue increased   Ambulation Distance (Feet) 330 Feet, 40, 40, 40   Assistive device Rolling walker   Gait Pattern Step-through pattern;Decreased step length - right;Decreased step length - left;Decreased dorsiflexion - right;Decreased dorsiflexion - left;Right foot flat;Left foot flat;Trunk flexed;Poor foot clearance - left;Poor foot clearance - right   Ambulation Surface Level;Indoor     Knee/Hip Exercises: Standing   Knee Flexion AROM;Both;10 reps  2 sets; manual, verbal cues for Rt knee flexion in stance   Knee Flexion Limitations Rt knee recurvatum in stance   Hip Abduction AROM;Both;10 reps;Knee straight  x 2 sets; alternating legs; cues for rt knee control     Ankle Exercises:  Seated   Toe Raise 20 reps                PT Education - 06/22/16 1546    Education provided Yes   Education Details Educated on protecting Rt knee as she tends to hyperextend her Rt knee as she fatigues. Educated on additional exercises for HEP. Educated on signs/symptoms of CVA (pt able to state one-sided weakness, speech/swallowing problems; educated on vision/balance possible symptoms)   Person(s) Educated Patient;Spouse   Methods Demonstration;Explanation;Handout   Comprehension Verbalized understanding;Returned demonstration;Need further instruction          PT Short Term Goals - 06/22/16 1709      PT SHORT TERM GOAL #1   Title Pt will perform HEP with family's supervision, to address balance, gait, transfers and strengthening.  TARGET 07/01/16   Time 4   Period Weeks   Status New     PT SHORT TERM GOAL #2   Title Pt will improve Berg Balance score to at least 26/56 for decreased fall risk.   Time 4   Period Weeks   Status New     PT SHORT TERM GOAL #3   Title Pt will improve TUG score to less than or equal to 50 seconds for decreased fall risk.   Time 4   Period Weeks   Status New     PT SHORT TERM GOAL #4   Title Pt will transfer sit<>stand, modified independently, at least 4 of 5 trials, to demo improved lower extremity strength and transfer efficiency.   Time 4   Period Weeks   Status New     PT SHORT TERM GOAL #5   Title Pt will ambulate at least 200 ft using RW, supervision, for improved gait safety and efficiency.   Time 4   Period Weeks   Status New     PT SHORT TERM GOAL #6   Title Pt/family will verbalize understanding of CVA education.   Time 4   Period Weeks   Status New           PT Long Term Goals - 06/22/16 1708      PT LONG TERM GOAL #1   Title Pt/family will verbalize understanding of fall prevention in the home environment.  TARGET 07/31/16   Time 8   Period Weeks   Status New     PT LONG TERM GOAL #2   Title Pt will  improve Berg Balance score to at least 31/56 for decreased fall risk.   Time 8   Period Weeks   Status New     PT LONG TERM GOAL #3   Title Pt will improve TUG score to less than or equal to 45 seconds for decreased fall risk.   Time 8  Period Weeks   Status New     PT LONG TERM GOAL #4   Title Pt will improve gait velocity to at least 1.8 ft/sec for improved gait efficiency and safety.   Time 8   Period Weeks   Status New     PT LONG TERM GOAL #5   Title Pt will ambulate at least 800 ft indoor and outdoor surfaces, using RW, modified independently, for improved outdoor and community.   Time 8   Period Weeks   Status New     PT LONG TERM GOAL #6   Title Pt will improve Neuro QOL score on FOTO by at least 20% for improved functional mobility.   Time 8   Period Weeks   Status New               Plan - 06/22/16 1557    Clinical Impression Statement Received order from Dr. Allena Katz for youth RW. Unfortunately no youth RW's in stock in the clinic. Provided husband with MD order and he plans to go to [employer] store to get RW. Initially worked on increasing ambulation distance (330 ft) with noted degradation of posture and rt knee istability as she fatigued. Strengthening focused on bil ankle DF and knee flexors. Weight shifting on/off RLE with focus on Rt knee control (preventing recurvatum).    Rehab Potential Good   PT Frequency 2x / week   PT Duration 6 weeks   PT Treatment/Interventions ADLs/Self Care Home Management;Functional mobility training;Stair training;Gait training;Patient/family education;Neuromuscular re-education;Balance training;Therapeutic exercise;Therapeutic activities;DME Instruction   PT Next Visit Plan if pt has new youth RW, assess proper height; safe use of RW during transfers/gait (esp stand to sit); review new HEP ex's (standing knee flex and hip abdct at counter) monitor Rt knee recurvatum; ? trial Rt AFO for recurvatum; balance activities   PT  Home Exercise Plan son Freida Busman Jakubowicz, PT) gave standing ex's at counter; pelvic tilts, bridging, seated bil ankle DF stretch, standing knee flexion, hip abdct   Consulted and Agree with Plan of Care Patient;Family member/caregiver   Family Member Consulted Husband      Patient will benefit from skilled therapeutic intervention in order to improve the following deficits and impairments:  Abnormal gait, Decreased balance, Decreased cognition, Decreased knowledge of use of DME, Decreased range of motion, Decreased safety awareness, Decreased strength  Visit Diagnosis: Hemiplegia and hemiparesis following cerebral infarction affecting right dominant side (HCC)  Other abnormalities of gait and mobility     Problem List Patient Active Problem List   Diagnosis Date Noted  . Slow transit constipation   . Acute blood loss anemia   . Adjustment disorder with depressed mood   . Cerebrovascular accident (CVA) (HCC) 02/03/2016  . Dysarthria, post-stroke   . Dysphagia, post-stroke   . Leukocytosis   . Prediabetes   . Right hemiparesis (HCC)   . Cerebral infarction due to unspecified mechanism   . Other secondary hypertension   . Overactive bladder   . Diastolic dysfunction   . Hyperlipidemia   . Essential hypertension   . Essential tremor   . Stroke (cerebrum) (HCC) 02/01/2016  . Facial droop due to stroke 02/01/2016  . Essential and other specified forms of tremor 02/20/2013  . Dysphonia 02/20/2013  . Trigeminal neuralgia 02/20/2013    Zena Amos, PT 06/22/2016, 5:14 PM  [place] [facility] [address] [place], [place], [address] Phone: [phone]   Fax:  [phone]  Name: Kelly Pine  [name] MRN: [id number] Date of Birth: [birth year]

## 2016-06-22 NOTE — Therapy (Signed)
[facility] [address] [place], [place], [address] Phone: [phone]   Fax:  [phone]  Occupational Therapy Treatment  Patient Details  Name: Kelly HumphreysBennie H [name] MRN: [id number] Date of Birth: 06/09/1927 Referring Provider: Darrol Angelarolyn Martin, NP  Encounter Date: 06/22/2016      OT End of Session - 06/22/16 1727    Visit Number 5   Number of Visits 17   Date for OT Re-Evaluation 07/29/16   Authorization Type Medicare / [employer] (g-code needed)   Authorization - Visit Number 5   Authorization - Number of Visits 10   OT Start Time 1406   OT Stop Time 1445   OT Time Calculation (min) 39 min   Activity Tolerance Patient tolerated treatment well   Behavior During Therapy [facility] for tasks assessed/performed      Past Medical History:  Diagnosis Date  . Acute blood loss anemia   . Adjustment disorder with depressed mood   . CVA (cerebral vascular accident) (HCC) 02/03/2016   Linear infarct within the posterior limb of left internal capsule  . Diastolic CHF (HCC)   . Dysphagia   . Dysphonia 02/20/2013  . Essential and other specified forms of tremor 02/20/2013  . HLD (hyperlipidemia)   . HTN (hypertension)   . OAB (overactive bladder)   . Osteoporosis   . Patient receiving subcutaneous heparin    For DVT prophylaxis 9/17  . Prediabetes   . Psoriasis   . Slow transit constipation   . Stroke (cerebrum) (HCC) 02/01/2016  . Trigeminal neuralgia 02/20/2013    Past Surgical History:  Procedure Laterality Date  . APPENDECTOMY  1940  . bladder tack    . CATARACT EXTRACTION Bilateral   . HEMORRHOID SURGERY  1960  . HUMERUS FRACTURE SURGERY    . LAPAROSCOPIC HYSTERECTOMY  2006  . torn rotator cuff    . WRIST FRACTURE SURGERY  2005   seconday shoulder 2001    There were no vitals filed for this visit.      Subjective Assessment - 06/22/16 1725    Subjective  Pt reports that she is able to raise her arm higher and  was able to use RUE to drink   Patient is accompained by: Family member  husband   Pertinent History adjustment disorder with depressed mood, diastolic CHF, osteoporosis, hx of fall, essential tremor, HTN, prediabetes, trigeminal neuralgia   Limitations fall risk   Patient Stated Goals be able to write, fold clothes, do dishes   Currently in Pain? No/denies       In supine, AAROM shoulder flex and chest press with ball with focus on finger/wrist ext and elbow ext.  Pt with min facilitation for scapula initially and min cueing.  In sitting, low range functional reaching to pick up checkers (with cueing/focus on tip pinch) and place in connect 4 slots with min-mod cueing for positioning/avoiding compensation.  Initially min facilitation, but progressed to performing without facilitation, but demo IR/elbow despite cues.  In sitting, low range functional reaching to grasp/release cylinder objects with focus on proper positioning, min cueing.  Thumb palmar abduction over cylinder object to facilitate incr tip pinch (pt demo difficulty with true opposition).  Pt performed with min cues.  In sitting, light wt. Bearing through RUE with hand on mat in abduction with contralateral reaching with min-mod cueing for posture/incr tricep activation.  OT Education - 06/22/16 1725    Education Details Added to HEP--see pt instructions   Person(s) Educated Patient   Methods Explanation;Demonstration;Handout;Verbal cues   Comprehension Verbalized understanding;Returned demonstration;Verbal cues required          OT Short Term Goals - 06/09/16 1632      OT SHORT TERM GOAL #1   Title Pt will be independent with initial HEP--check STGs 06/30/16   Time 4   Period Weeks   Status New     OT SHORT TERM GOAL #2   Title Pt will be able to write name with at least 75% legibility.   Time 4   Period Weeks   Status New     OT SHORT TERM GOAL #3   Title Pt will  demo at least 95* R shoulder flex with min compensation in prep for functional reach.   Time 4   Period Weeks   Status New     OT SHORT TERM GOAL #4   Title Pt will improve functional reaching and grasp/release as shown by improving score on box and blocks by at least 5.   Baseline 3 blocks   Time 4   Period Weeks   Status New           OT Long Term Goals - 06/09/16 1632      OT LONG TERM GOAL #1   Title Pt will be independent with updated HEP--07/30/16   Time 8   Period Weeks   Status New     OT LONG TERM GOAL #2   Title Pt will be able to write name with at least 95% legibility and simple sentence with at least 75% legibility.   Time 8   Period Weeks   Status New     OT LONG TERM GOAL #3   Title Pt will demo at least 100* R shoulder flex with min compensation in prep for functional reach.   Period Weeks   Status New     OT LONG TERM GOAL #4   Title Pt will improve functional reaching and grasp/release as shown by improving score on box and blocks by at least 10.   Baseline 3 blocks   Time 8   Period Weeks   Status New     OT LONG TERM GOAL #5   Title Pt will be able to use dominant RUE for grooming tasks at least 75% of the time.   Time 8   Period Weeks   Status New     OT LONG TERM GOAL #6   Title Pt will perform simple home maintenance tasks with supervision.   Time 8   Period Weeks   Status New               Plan - 06/22/16 1726    Clinical Impression Statement Pt progressing towards goals.  Pt reports no problems with splint and that she is able to raise her arm higher and drink with it now.   Rehab Potential Good   OT Frequency 2x / week   OT Duration 8 weeks   OT Treatment/Interventions Self-care/ADL training;Therapeutic exercise;DME and/or AE instruction;Building services engineer;Therapeutic activities;Patient/family education;Balance training;Neuromuscular education;Electrical Stimulation;Moist Heat;Energy conservation;Passive range of  motion;Therapeutic exercises;Splinting;Manual Therapy;Fluidtherapy;Ultrasound;Cryotherapy;Parrafin   Plan continue with neuro re-ed, RUE functional use   OT Home Exercise Plan Education provided:  06/05/15 Initial HEP (table slides with light wt. bearing, tracing, and flipping cards); 06/22/16 supine ball ex and thumb opposition   Consulted and Agree  with Plan of Care Patient;Family member/caregiver      Patient will benefit from skilled therapeutic intervention in order to improve the following deficits and impairments:  Decreased coordination, Decreased activity tolerance, Decreased knowledge of use of DME, Decreased strength, Impaired UE functional use, Impaired tone, Decreased range of motion, Decreased balance, Decreased mobility, Difficulty walking  Visit Diagnosis: Hemiplegia and hemiparesis following cerebral infarction affecting right dominant side (HCC)  Other abnormalities of gait and mobility  Other symptoms and signs involving the nervous system  Other lack of coordination  Muscle weakness (generalized)  Unsteadiness on feet    Problem List Patient Active Problem List   Diagnosis Date Noted  . Slow transit constipation   . Acute blood loss anemia   . Adjustment disorder with depressed mood   . Cerebrovascular accident (CVA) (HCC) 02/03/2016  . Dysarthria, post-stroke   . Dysphagia, post-stroke   . Leukocytosis   . Prediabetes   . Right hemiparesis (HCC)   . Cerebral infarction due to unspecified mechanism   . Other secondary hypertension   . Overactive bladder   . Diastolic dysfunction   . Hyperlipidemia   . Essential hypertension   . Essential tremor   . Stroke (cerebrum) (HCC) 02/01/2016  . Facial droop due to stroke 02/01/2016  . Essential and other specified forms of tremor 02/20/2013  . Dysphonia 02/20/2013  . Trigeminal neuralgia 02/20/2013    [facility] 06/22/2016, 5:33 PM  [facility] [address] [place], [place], [address] Phone: [phone]   Fax:  [phone]  Name: Kelly [name] MRN: [id number] Date of Birth: 09/11/1927   Willa Frater, OTR/L [facility] [address] [place], [place]  [address] [phone] phone [phone] 06/22/16 5:33 PM

## 2016-06-22 NOTE — Patient Instructions (Signed)
FLEXION: Standing - Stable (Active)    Stand, both feet flat. Bend right knee, bringing heel toward buttocks. Complete _2__ sets of _10__ repetitions. Perform _1__ sessions per day. Then stand on right leg with knee slightly bent (do not allow it to push backwards) and bend left knee bringing heel toward buttocks.   http://gtsc.exer.us/241   Copyright  VHI. All rights reserved.      ABDUCTION: Standing (Active)    Stand, feet flat, facing counter with both arms on counter. Lift right leg out to side. Next lift left leg out to the side (keep rt knee slightly bent). Alternate right, left for a count of 20. Complete __2_ sets. Perform __1_ sessions per day.  http://gtsc.exer.us/111   Copyright  VHI. All rights reserved.

## 2016-06-22 NOTE — Patient Instructions (Addendum)
1.  Lay on your back.  Hold 12-15" ball in both hands with palms flat and fingers extended on sides of ball.  Place ball on the tops of your legs.  Keeping elbows straight, raise ball up and overhead keeping fingers extended.  Repeat 10 times.  2 times/day.  Rest if needed.  Then, hold ball at chest with elbows bent by your sides.  Push ball up to ceiling extending your elbows.  10 times.      2.   Touch tip of thumb to nail tip of each finger in turn, making an "O" shape. Repeat 10times. Do 2 sessions per day.   3.  Hold bottle in right hand (may have to turn upside down)  Slide thumb up and over bottle and then back down so that you are holding like a cup.

## 2016-06-23 ENCOUNTER — Ambulatory Visit: Payer: Medicare Other | Admitting: Physical Therapy

## 2016-06-23 ENCOUNTER — Encounter: Payer: Medicare Other | Admitting: Occupational Therapy

## 2016-06-24 ENCOUNTER — Encounter: Payer: Self-pay | Admitting: Physical Therapy

## 2016-06-24 ENCOUNTER — Ambulatory Visit: Payer: Medicare Other | Admitting: Physical Therapy

## 2016-06-24 ENCOUNTER — Ambulatory Visit: Payer: Medicare Other | Admitting: Occupational Therapy

## 2016-06-24 DIAGNOSIS — I69351 Hemiplegia and hemiparesis following cerebral infarction affecting right dominant side: Secondary | ICD-10-CM

## 2016-06-24 DIAGNOSIS — M6281 Muscle weakness (generalized): Secondary | ICD-10-CM | POA: Diagnosis not present

## 2016-06-24 DIAGNOSIS — R29818 Other symptoms and signs involving the nervous system: Secondary | ICD-10-CM | POA: Diagnosis not present

## 2016-06-24 DIAGNOSIS — R278 Other lack of coordination: Secondary | ICD-10-CM | POA: Diagnosis not present

## 2016-06-24 DIAGNOSIS — R2689 Other abnormalities of gait and mobility: Secondary | ICD-10-CM

## 2016-06-24 DIAGNOSIS — R2681 Unsteadiness on feet: Secondary | ICD-10-CM | POA: Diagnosis not present

## 2016-06-24 NOTE — Therapy (Signed)
[facility] [address] [place], [place], [address] Phone: [phone]   Fax:  [phone]  Occupational Therapy Treatment  Patient Details  Name: Kelly [name] MRN: [id number] Date of Birth: Jan 08, 1928 Referring Provider: Darrol Angel, NP  Encounter Date: 06/24/2016      OT End of Session - 06/24/16 1549    Visit Number 6   Number of Visits 17   Date for OT Re-Evaluation 07/29/16   Authorization Type Medicare / [employer] (g-code needed)   Authorization - Visit Number 6   Authorization - Number of Visits 10   OT Start Time 1452   OT Stop Time 1530   OT Time Calculation (min) 38 min      Past Medical History:  Diagnosis Date  . Acute blood loss anemia   . Adjustment disorder with depressed mood   . CVA (cerebral vascular accident) (HCC) 02/03/2016   Linear infarct within the posterior limb of left internal capsule  . Diastolic CHF (HCC)   . Dysphagia   . Dysphonia 02/20/2013  . Essential and other specified forms of tremor 02/20/2013  . HLD (hyperlipidemia)   . HTN (hypertension)   . OAB (overactive bladder)   . Osteoporosis   . Patient receiving subcutaneous heparin    For DVT prophylaxis 9/17  . Prediabetes   . Psoriasis   . Slow transit constipation   . Stroke (cerebrum) (HCC) 02/01/2016  . Trigeminal neuralgia 02/20/2013    Past Surgical History:  Procedure Laterality Date  . APPENDECTOMY  1940  . bladder tack    . CATARACT EXTRACTION Bilateral   . HEMORRHOID SURGERY  1960  . HUMERUS FRACTURE SURGERY    . LAPAROSCOPIC HYSTERECTOMY  2006  . torn rotator cuff    . WRIST FRACTURE SURGERY  2005   seconday shoulder 2001    There were no vitals filed for this visit.      Subjective Assessment - 06/24/16 1547    Subjective  Pt reports splint is doing well overall, pt reports thumb strap was too tight, so she was instructed to loosen strap or stop wearing splint if it bothers her   Pertinent  History adjustment disorder with depressed mood, diastolic CHF, osteoporosis, hx of fall, essential tremor, HTN, prediabetes, trigeminal neuralgia   Patient Stated Goals be able to write, fold clothes, do dishes   Currently in Pain? No/denies         Treatment: Reviewed supine ball ex for shoulder flexion and chest press, 10 reps each, min v.c./ facilitation Seated weightbearing through RUE edge of mat  Min facilitation. Seated at table folding towels, washcloths, and pillow cases using bilateral UE's, min v.c. For RUE functional use, and folding a washcloth using only RUE with mod difficulty. Standing to wash and dry table top with close supervision/ min v.c. Seated placing dowel pegs in pegboard using RUE with min-mod difficulty, min v.c. Standing for low range reaching to place 1 inch blocks in bowl, close supervision for balance Pt was instructed to try folding washcloths at home in seated. Therapistt recommended supervision with any standing activities.                       OT Short Term Goals - 06/09/16 1632      OT SHORT TERM GOAL #1   Title Pt will be independent with initial HEP--check STGs 06/30/16   Time 4   Period Weeks   Status New  OT SHORT TERM GOAL #2   Title Pt will be able to write name with at least 75% legibility.   Time 4   Period Weeks   Status New     OT SHORT TERM GOAL #3   Title Pt will demo at least 95* R shoulder flex with min compensation in prep for functional reach.   Time 4   Period Weeks   Status New     OT SHORT TERM GOAL #4   Title Pt will improve functional reaching and grasp/release as shown by improving score on box and blocks by at least 5.   Baseline 3 blocks   Time 4   Period Weeks   Status New           OT Long Term Goals - 06/09/16 1632      OT LONG TERM GOAL #1   Title Pt will be independent with updated HEP--07/30/16   Time 8   Period Weeks   Status New     OT LONG TERM GOAL #2   Title Pt will be  able to write name with at least 95% legibility and simple sentence with at least 75% legibility.   Time 8   Period Weeks   Status New     OT LONG TERM GOAL #3   Title Pt will demo at least 100* R shoulder flex with min compensation in prep for functional reach.   Period Weeks   Status New     OT LONG TERM GOAL #4   Title Pt will improve functional reaching and grasp/release as shown by improving score on box and blocks by at least 10.   Baseline 3 blocks   Time 8   Period Weeks   Status New     OT LONG TERM GOAL #5   Title Pt will be able to use dominant RUE for grooming tasks at least 75% of the time.   Time 8   Period Weeks   Status New     OT LONG TERM GOAL #6   Title Pt will perform simple home maintenance tasks with supervision.   Time 8   Period Weeks   Status New               Plan - 06/24/16 1548    Clinical Impression Statement Pt is progressing towards goals. she demonstrates improved RUE functional use.   Rehab Potential Good   OT Frequency 2x / week   OT Duration 8 weeks   OT Treatment/Interventions Self-care/ADL training;Therapeutic exercise;DME and/or AE instruction;Building services engineerunctional Mobility Training;Therapeutic activities;Patient/family education;Balance training;Neuromuscular education;Electrical Stimulation;Moist Heat;Energy conservation;Passive range of motion;Therapeutic exercises;Splinting;Manual Therapy;Fluidtherapy;Ultrasound;Cryotherapy;Parrafin   Plan neuro re-ed, RUE functional use   OT Home Exercise Plan Education provided:  06/05/15 Initial HEP (table slides with light wt. bearing, tracing, and flipping cards); 06/22/16 supine ball ex and thumb opposition   Consulted and Agree with Plan of Care Patient;Family member/caregiver   Family Member Consulted husband      Patient will benefit from skilled therapeutic intervention in order to improve the following deficits and impairments:  Decreased coordination, Decreased activity tolerance, Decreased  knowledge of use of DME, Decreased strength, Impaired UE functional use, Impaired tone, Decreased range of motion, Decreased balance, Decreased mobility, Difficulty walking  Visit Diagnosis: Hemiplegia and hemiparesis following cerebral infarction affecting right dominant side (HCC)  Other abnormalities of gait and mobility  Other symptoms and signs involving the nervous system  Other lack of coordination  Muscle weakness (generalized)  Problem List Patient Active Problem List   Diagnosis Date Noted  . Slow transit constipation   . Acute blood loss anemia   . Adjustment disorder with depressed mood   . Cerebrovascular accident (CVA) (HCC) 02/03/2016  . Dysarthria, post-stroke   . Dysphagia, post-stroke   . Leukocytosis   . Prediabetes   . Right hemiparesis (HCC)   . Cerebral infarction due to unspecified mechanism   . Other secondary hypertension   . Overactive bladder   . Diastolic dysfunction   . Hyperlipidemia   . Essential hypertension   . Essential tremor   . Stroke (cerebrum) (HCC) 02/01/2016  . Facial droop due to stroke 02/01/2016  . Essential and other specified forms of tremor 02/20/2013  . Dysphonia 02/20/2013  . Trigeminal neuralgia 02/20/2013    Lille Karim 06/24/2016, 3:50 PM  [facility] [address] [place], [place], [address] Phone: [phone]   Fax:  [phone]  Name: Kelly [name] MRN: [id number] Date of Birth: 11-27-1927

## 2016-06-24 NOTE — Therapy (Signed)
[facility] [address] [place], [place], [address] Phone: [phone]   Fax:  [phone]  Physical Therapy Treatment  Patient Details  Name: Kelly HumphreysBennie H [name] MRN: [id number] Date of Birth: 11/25/1927 Referring Provider: Darrol AngelMartin, Carolyn  Encounter Date: 06/24/2016      PT End of Session - 06/24/16 1925    Visit Number 7   Number of Visits 18   Date for PT Re-Evaluation 07/31/16   Authorization Type Medicare/[employer]amp [employer] every 10th visit   PT Start Time 1401   PT Stop Time 1445   PT Time Calculation (min) 44 min   Equipment Utilized During Treatment Gait belt   Activity Tolerance Patient tolerated treatment well   Behavior During Therapy [facility] for tasks assessed/performed      Past Medical History:  Diagnosis Date  . Acute blood loss anemia   . Adjustment disorder with depressed mood   . CVA (cerebral vascular accident) (HCC) 02/03/2016   Linear infarct within the posterior limb of left internal capsule  . Diastolic CHF (HCC)   . Dysphagia   . Dysphonia 02/20/2013  . Essential and other specified forms of tremor 02/20/2013  . HLD (hyperlipidemia)   . HTN (hypertension)   . OAB (overactive bladder)   . Osteoporosis   . Patient receiving subcutaneous heparin    For DVT prophylaxis 9/17  . Prediabetes   . Psoriasis   . Slow transit constipation   . Stroke (cerebrum) (HCC) 02/01/2016  . Trigeminal neuralgia 02/20/2013    Past Surgical History:  Procedure Laterality Date  . APPENDECTOMY  1940  . bladder tack    . CATARACT EXTRACTION Bilateral   . HEMORRHOID SURGERY  1960  . HUMERUS FRACTURE SURGERY    . LAPAROSCOPIC HYSTERECTOMY  2006  . torn rotator cuff    . WRIST FRACTURE SURGERY  2005   seconday shoulder 2001    There were no vitals filed for this visit.      Subjective Assessment - 06/24/16 1402    Subjective No falls or issues since last appt. Husband reports they still have not heard from  [facility] about the youth RW but plans to call this afternoon.    Patient is accompained by: Family member   Patient Stated Goals Pt's goal for therapy is to walk again.   Currently in Pain? No/denies      PT assessed gait with [place] AFO with 1/4" heel wedge to assist recurvatum. Pt reports [facility] at [facility] trailed AFO but did not think it helped. RW Gait Velocity: 20' with AFO 12.83sec (1.56 ft/sec) and gait without AFO 11.85sec (1.69 ft/sec) TUG with RW: with AFO 27.20 and without AFO 22.71sec Gait step length and right foot clearance appeared same with  & without AFO. Right knee recurvatum mild without AFO & not present with AFO with 1/4" lift. Patient ambulated 4375' X 4 with youth RW with cues on posture and tactile cues on right knee control.  Right ankle PROM dorsiflexion with knee extended: 90* Pt and husband demonstrate AROM as dorsiflexion stretch they are currently doing. PT attempted seated stretch with towel but UE weakness resulted in ineffective stretch.  PT instructed in standing gastroc stretch with step with left rail (stairs at their home have left rail). Pt required RUE handhold and left rail to step up & down from step with minA. PT instructed with demo, tactile, verbal cues in gastroc stretch standing with back to wall with RW support and forefoot  on 2" block. Pt return demo & husband verbalized understanding.                           PT Education - 06/24/16 1400    Education provided Yes   Education Details Gastroc stretch standing for increased pressure   Person(s) Educated Patient;Spouse   Methods Explanation;Demonstration;Tactile cues;Verbal cues   Comprehension Verbalized understanding;Returned demonstration;Verbal cues required;Tactile cues required;Need further instruction          PT Short Term Goals - 06/24/16 1925      PT SHORT TERM GOAL #1   Title Pt will perform HEP with family's supervision, to address balance, gait,  transfers and strengthening.  TARGET 07/01/16   Time 4   Period Weeks   Status On-going     PT SHORT TERM GOAL #2   Title Pt will improve Berg Balance score to at least 26/56 for decreased fall risk.   Time 4   Period Weeks   Status On-going     PT SHORT TERM GOAL #3   Title Pt will improve TUG score to less than or equal to 50 seconds for decreased fall risk.   Time 4   Period Weeks   Status On-going     PT SHORT TERM GOAL #4   Title Pt will transfer sit<>stand, modified independently, at least 4 of 5 trials, to demo improved lower extremity strength and transfer efficiency.   Time 4   Period Weeks   Status On-going     PT SHORT TERM GOAL #5   Title Pt will ambulate at least 200 ft using RW, supervision, for improved gait safety and efficiency.   Time 4   Period Weeks   Status On-going     PT SHORT TERM GOAL #6   Title Pt/family will verbalize understanding of CVA education.   Time 4   Period Weeks   Status On-going           PT Long Term Goals - 06/24/16 1926      PT LONG TERM GOAL #1   Title Pt/family will verbalize understanding of fall prevention in the home environment.  TARGET 07/31/16   Time 8   Period Weeks   Status On-going     PT LONG TERM GOAL #2   Title Pt will improve Berg Balance score to at least 31/56 for decreased fall risk.   Time 8   Period Weeks   Status On-going     PT LONG TERM GOAL #3   Title Pt will improve TUG score to less than or equal to 45 seconds for decreased fall risk.   Time 8   Period Weeks   Status On-going     PT LONG TERM GOAL #4   Title Pt will improve gait velocity to at least 1.8 ft/sec for improved gait efficiency and safety.   Time 8   Period Weeks   Status On-going     PT LONG TERM GOAL #5   Title Pt will ambulate at least 800 ft indoor and outdoor surfaces, using RW, modified independently, for improved outdoor and community.   Time 8   Period Weeks   Status On-going     PT LONG TERM GOAL #6   Title Pt  will improve Neuro QOL score on FOTO by at least 20% for improved functional mobility.   Time 8   Period Weeks   Status On-going  Plan - 06/24/16 1927    Clinical Impression Statement AFO did not appear to improve patient's gait and timed tests actually had increased time with AFO. Patient appears to have tightness in gastroc along with weakness in gastroc, hamstrings & quadriceps.    Rehab Potential Good   PT Frequency 2x / week   PT Duration 6 weeks   PT Treatment/Interventions ADLs/Self Care Home Management;Functional mobility training;Stair training;Gait training;Patient/family education;Neuromuscular re-education;Balance training;Therapeutic exercise;Therapeutic activities;DME Instruction   PT Next Visit Plan if pt has new youth RW, assess proper height; safe use of RW during transfers/gait (esp stand to sit); review gastroc stretches and add theraband knee control exercises.    PT Home Exercise Plan son Freida Busman Mair, PT) gave standing ex's at counter; pelvic tilts, bridging, seated bil ankle DF stretch, standing knee flexion, hip abdct   Consulted and Agree with Plan of Care Patient;Family member/caregiver   Family Member Consulted Husband      Patient will benefit from skilled therapeutic intervention in order to improve the following deficits and impairments:  Abnormal gait, Decreased balance, Decreased cognition, Decreased knowledge of use of DME, Decreased range of motion, Decreased safety awareness, Decreased strength  Visit Diagnosis: Hemiplegia and hemiparesis following cerebral infarction affecting right dominant side (HCC)  Other abnormalities of gait and mobility  Other symptoms and signs involving the nervous system  Muscle weakness (generalized)  Unsteadiness on feet     Problem List Patient Active Problem List   Diagnosis Date Noted  . Slow transit constipation   . Acute blood loss anemia   . Adjustment disorder with depressed mood   .  Cerebrovascular accident (CVA) (HCC) 02/03/2016  . Dysarthria, post-stroke   . Dysphagia, post-stroke   . Leukocytosis   . Prediabetes   . Right hemiparesis (HCC)   . Cerebral infarction due to unspecified mechanism   . Other secondary hypertension   . Overactive bladder   . Diastolic dysfunction   . Hyperlipidemia   . Essential hypertension   . Essential tremor   . Stroke (cerebrum) (HCC) 02/01/2016  . Facial droop due to stroke 02/01/2016  . Essential and other specified forms of tremor 02/20/2013  . Dysphonia 02/20/2013  . Trigeminal neuralgia 02/20/2013    Vladimir Faster PT, DPT 06/24/2016, 7:45 PM  [facility] [address] [place], [place], [address] Phone: [phone]   Fax:  [phone]  Name: Kelly [name] MRN: [id number] Date of Birth: [birth year]

## 2016-06-24 NOTE — Patient Instructions (Addendum)
ANKLE: Dorsiflexion, Step Unilateral    Stand on step, hang one heel off back of step. Hold _15-20__ seconds. _2-3__ reps on each leg Hold onto a support.  Copyright  VHI. All rights reserved.   Stand with rolling walker with 2-4" block placed on floor in front of feet.  Place right forefoot on 2-4" block and press heel towards floor. Shift hips forward to put weight on right foot.  Hold 15-20 seconds.  Switch to left foot. Repeat 2-3 times on each leg.

## 2016-06-29 ENCOUNTER — Ambulatory Visit: Payer: Medicare Other | Attending: Nurse Practitioner | Admitting: Physical Therapy

## 2016-06-29 ENCOUNTER — Encounter: Payer: Self-pay | Admitting: Physical Therapy

## 2016-06-29 ENCOUNTER — Ambulatory Visit: Payer: Medicare Other | Admitting: Occupational Therapy

## 2016-06-29 DIAGNOSIS — R2681 Unsteadiness on feet: Secondary | ICD-10-CM | POA: Insufficient documentation

## 2016-06-29 DIAGNOSIS — R2689 Other abnormalities of gait and mobility: Secondary | ICD-10-CM | POA: Insufficient documentation

## 2016-06-29 DIAGNOSIS — M6281 Muscle weakness (generalized): Secondary | ICD-10-CM | POA: Insufficient documentation

## 2016-06-29 DIAGNOSIS — R29818 Other symptoms and signs involving the nervous system: Secondary | ICD-10-CM

## 2016-06-29 DIAGNOSIS — R278 Other lack of coordination: Secondary | ICD-10-CM

## 2016-06-29 DIAGNOSIS — I69351 Hemiplegia and hemiparesis following cerebral infarction affecting right dominant side: Secondary | ICD-10-CM

## 2016-06-29 NOTE — Therapy (Signed)
[place] [address] [place], [place], [address] Phone: [phone]   Fax:  [phone]  Occupational Therapy Treatment  Patient Details  Name: Kelly [name] MRN: [id number] Date of Birth: November 07, 1927 Referring Provider: Cecille Rubin, NP  Encounter Date: 06/29/2016      OT End of Session - 06/29/16 1554    Visit Number 7   Number of Visits 17   Date for OT Re-Evaluation 07/29/16   Authorization Type Medicare / [employer] (g-code needed)   Authorization - Visit Number 7   Authorization - Number of Visits 10   OT Start Time 1406   OT Stop Time 1447   OT Time Calculation (min) 41 min   Activity Tolerance Patient tolerated treatment well   Behavior During Therapy [facility] for tasks assessed/performed      Past Medical History:  Diagnosis Date  . Acute blood loss anemia   . Adjustment disorder with depressed mood   . CVA (cerebral vascular accident) ([place]) 02/03/2016   Linear infarct within the posterior limb of left internal capsule  . Diastolic CHF ([place])   . Dysphagia   . Dysphonia 02/20/2013  . Essential and other specified forms of tremor 02/20/2013  . HLD (hyperlipidemia)   . HTN (hypertension)   . OAB (overactive bladder)   . Osteoporosis   . Patient receiving subcutaneous heparin    For DVT prophylaxis 9/17  . Prediabetes   . Psoriasis   . Slow transit constipation   . Stroke (cerebrum) ([place]) 02/01/2016  . Trigeminal neuralgia 02/20/2013    Past Surgical History:  Procedure Laterality Date  . APPENDECTOMY  1940  . bladder tack    . CATARACT EXTRACTION Bilateral   . HEMORRHOID SURGERY  1960  . HUMERUS FRACTURE SURGERY    . LAPAROSCOPIC HYSTERECTOMY  2006  . torn rotator cuff    . WRIST FRACTURE SURGERY  2005   seconday shoulder 2001    There were no vitals filed for this visit.      Subjective Assessment - 06/29/16 1554    Subjective  Pt reports that she has been working with the ball.    Pertinent History adjustment disorder with depressed mood, diastolic CHF, osteoporosis, hx of fall, essential tremor, HTN, prediabetes, trigeminal neuralgia   Limitations fall risk   Patient Stated Goals be able to write, fold clothes, do dishes   Currently in Pain? No/denies         In supine, AAROM shoulder flex and chest press with BUEs using ball.  Min cueing for R elbow ext/finger ext.    In sitting, functional reaching (low-range) to grasp release cylinder objects (forward flex and abduction/ER as able), min facilitation to stabilize object in forward flex for grasp and min-mod cueing for full elbow ext as able for functional reach and for finger ext with grasp.  Pt demo improved grasp/release and reach.  Practice writing name (first/last) with improved performance from eval (but continues to be inconsistent 75-90% legibility).  Also practiced writing vertical lines/"l" and "o"/circles due to noted difficulty with fine motor control with pen for "round" letters and spacing over after vertical letters.   Began checking STGs and discussing progress--see goals section below.                   OT Education - 06/29/16 1556    Education Details Practice vertical lines/"l" and circles/"o" on lined paper focusing on hitting top/bottom of line   Person(s) Educated Patient;Spouse  Methods Explanation;Demonstration;Verbal cues   Comprehension Verbalized understanding;Returned demonstration;Verbal cues required          OT Short Term Goals - 06/29/16 1437      OT SHORT TERM GOAL #1   Title Pt will be independent with initial HEP--check STGs 06/30/16   Time 4   Period Weeks   Status New     OT SHORT TERM GOAL #2   Title Pt will be able to write name with at least 75% legibility.   Time 4   Period Weeks   Status Achieved  06/29/16:  met approx this level or higher for name (inconsistent at times)     OT SHORT TERM GOAL #3   Title Pt will demo at least 95* R shoulder  flex with min compensation in prep for functional reach.   Time 4   Period Weeks   Status Achieved  06/29/16:  met at approx this level.     OT SHORT TERM GOAL #4   Title Pt will improve functional reaching and grasp/release as shown by improving score on box and blocks by at least 5.   Baseline 3 blocks   Time 4   Period Weeks   Status New           OT Long Term Goals - 06/09/16 1632      OT LONG TERM GOAL #1   Title Pt will be independent with updated HEP--07/30/16   Time 8   Period Weeks   Status New     OT LONG TERM GOAL #2   Title Pt will be able to write name with at least 95% legibility and simple sentence with at least 75% legibility.   Time 8   Period Weeks   Status New     OT LONG TERM GOAL #3   Title Pt will demo at least 100* R shoulder flex with min compensation in prep for functional reach.   Period Weeks   Status New     OT LONG TERM GOAL #4   Title Pt will improve functional reaching and grasp/release as shown by improving score on box and blocks by at least 10.   Baseline 3 blocks   Time 8   Period Weeks   Status New     OT LONG TERM GOAL #5   Title Pt will be able to use dominant RUE for grooming tasks at least 75% of the time.   Time 8   Period Weeks   Status New     OT LONG TERM GOAL #6   Title Pt will perform simple home maintenance tasks with supervision.   Time 8   Period Weeks   Status New               Plan - 06/29/16 1440    Clinical Impression Statement Pt is progressing towards goals with improved RUE functional reach/grasp and ability and ability to write name.   Rehab Potential Good   OT Frequency 2x / week   OT Duration 8 weeks   OT Treatment/Interventions Self-care/ADL training;Therapeutic exercise;DME and/or AE instruction;Therapist, nutritional;Therapeutic activities;Patient/family education;Balance training;Neuromuscular education;Electrical Stimulation;Moist Heat;Energy conservation;Passive range of  motion;Therapeutic exercises;Splinting;Manual Therapy;Fluidtherapy;Ultrasound;Cryotherapy;Parrafin   Plan Review previous HEPs prn/check remaining STGs, RUE functional use, neuro re-ed   OT Home Exercise Plan Education provided:  06/05/15 Initial HEP (table slides with light wt. bearing, tracing, and flipping cards); 06/22/16 supine ball ex and thumb opposition   Consulted and Agree with Plan of Care Patient;Family  member/caregiver   Family Member Consulted husband      Patient will benefit from skilled therapeutic intervention in order to improve the following deficits and impairments:  Decreased coordination, Decreased activity tolerance, Decreased knowledge of use of DME, Decreased strength, Impaired UE functional use, Impaired tone, Decreased range of motion, Decreased balance, Decreased mobility, Difficulty walking  Visit Diagnosis: Hemiplegia and hemiparesis following cerebral infarction affecting right dominant side (HCC)  Other abnormalities of gait and mobility  Other symptoms and signs involving the nervous system  Other lack of coordination  Muscle weakness (generalized)  Unsteadiness on feet    Problem List Patient Active Problem List   Diagnosis Date Noted  . Slow transit constipation   . Acute blood loss anemia   . Adjustment disorder with depressed mood   . Cerebrovascular accident (CVA) ([place]) 02/03/2016  . Dysarthria, post-stroke   . Dysphagia, post-stroke   . Leukocytosis   . Prediabetes   . Right hemiparesis ([place])   . Cerebral infarction due to unspecified mechanism   . Other secondary hypertension   . Overactive bladder   . Diastolic dysfunction   . Hyperlipidemia   . Essential hypertension   . Essential tremor   . Stroke (cerebrum) ([place]) 02/01/2016  . Facial droop due to stroke 02/01/2016  . Essential and other specified forms of tremor 02/20/2013  . Dysphonia 02/20/2013  . Trigeminal neuralgia 02/20/2013    [facility] 06/29/2016, 3:57  PM  [place] [address] [place], [place], [address] Phone: [phone]   Fax:  [phone]  Name: Kelly [name] MRN: [id number] Date of Birth: 1927/07/05   Vianne Bulls, OTR/L [facility] [address] [place], [place]  [address] [phone] phone [phone] 06/29/16 3:57 PM

## 2016-06-30 NOTE — Therapy (Signed)
[facility] [address] [place], [place], [address] Phone: [phone]   Fax:  [phone]  Physical Therapy Treatment  Patient Details  Name: Kelly [name] MRN: [id number] Date of Birth: 06/30/1927 Referring Provider: Cecille Rubin  Encounter Date: 06/29/2016      PT End of Session - 06/29/16 1453    Visit Number 8   Number of Visits 18   Date for PT Re-Evaluation 07/31/16   Authorization Type Medicare/[employer] every 10th visit   PT Start Time 1448   PT Stop Time 1530   PT Time Calculation (min) 42 min   Equipment Utilized During Treatment Gait belt   Activity Tolerance Patient tolerated treatment well   Behavior During Therapy [facility] for tasks assessed/performed      Past Medical History:  Diagnosis Date  . Acute blood loss anemia   . Adjustment disorder with depressed mood   . CVA (cerebral vascular accident) ([place]) 02/03/2016   Linear infarct within the posterior limb of left internal capsule  . Diastolic CHF ([place])   . Dysphagia   . Dysphonia 02/20/2013  . Essential and other specified forms of tremor 02/20/2013  . HLD (hyperlipidemia)   . HTN (hypertension)   . OAB (overactive bladder)   . Osteoporosis   . Patient receiving subcutaneous heparin    For DVT prophylaxis 9/17  . Prediabetes   . Psoriasis   . Slow transit constipation   . Stroke (cerebrum) ([place]) 02/01/2016  . Trigeminal neuralgia 02/20/2013    Past Surgical History:  Procedure Laterality Date  . APPENDECTOMY  1940  . bladder tack    . CATARACT EXTRACTION Bilateral   . HEMORRHOID SURGERY  1960  . HUMERUS FRACTURE SURGERY    . LAPAROSCOPIC HYSTERECTOMY  2006  . torn rotator cuff    . WRIST FRACTURE SURGERY  2005   seconday shoulder 2001    There were no vitals filed for this visit.      Subjective Assessment - 06/29/16 1452    Subjective Has her new youth walker, has been adjusted by OT prior to PT session. No falls or  pain to report.    Patient is accompained by: Family member   Patient Stated Goals Pt's goal for therapy is to walk again.   Currently in Pain? No/denies   Pain Score 0-No pain            [facility] PT Assessment - 06/29/16 1454      Transfers   Transfers Sit to Stand;Stand to Sit   Sit to Stand 5: Supervision;With upper extremity assist;With armrests;From bed;From chair/3-in-1   Sit to Stand Details Verbal cues for technique;Verbal cues for safe use of DME/AE   Sit to Stand Details (indicate cue type and reason) cues to scoot to edge and for hand placement with transfers   Stand to Sit 5: Supervision;With upper extremity assist;To chair/3-in-1;To bed;Uncontrolled descent   Stand to Sit Details (indicate cue type and reason) Verbal cues for sequencing;Verbal cues for precautions/safety;Verbal cues for safe use of DME/AE   Stand to Sit Details cues to reach back and to turn all the way to the surface before sitting down.     Ambulation/Gait   Ambulation/Gait Yes   Ambulation/Gait Assistance 4: Min guard;5: Supervision   Ambulation/Gait Assistance Details cues on posture, to look up and on walker use/position with gait. only 1 episode of right foot catching with all of gait today.   Ambulation Distance (Feet) 210 Feet  x2   Assistive device Rolling walker   Gait Pattern Step-through pattern;Decreased step length - right;Decreased stance time - right;Decreased step length - left;Trunk flexed;Narrow base of support   Ambulation Surface Level;Indoor     Berg Balance Test   Sit to Stand Able to stand  independently using hands   Standing Unsupported Able to stand 2 minutes with supervision   Sitting with Back Unsupported but Feet Supported on Floor or Stool Able to sit safely and securely 2 minutes   Stand to Sit Controls descent by using hands   Transfers Able to transfer safely, definite need of hands   Standing Unsupported with Eyes Closed Able to stand 10 seconds with supervision    Standing Ubsupported with Feet Together Able to place feet together independently but unable to hold for 30 seconds   From Standing, Reach Forward with Outstretched Arm Can reach forward >5 cm safely (2")  3 inches, needs supervision   From Standing Position, Pick up Object from Floor Unable to pick up shoe, but reaches 2-5 cm (1-2") from shoe and balances independently   From Standing Position, Turn to Look Behind Over each Shoulder Turn sideways only but maintains balance   Turn 360 Degrees Needs assistance while turning   Standing Unsupported, Alternately Place Feet on Step/Stool Needs assistance to keep from falling or unable to try   Standing Unsupported, One Foot in Front Able to take small step independently and hold 30 seconds   Standing on One Leg Tries to lift leg/unable to hold 3 seconds but remains standing independently   Total Score 30     Timed Up and Go Test   Normal TUG (seconds) 23.81  with RW   TUG Comments Scores >13.5 sec indicate increased fall risk; >30 sec indicates difficulty with ADLs in the home.              PT Short Term Goals - 06/29/16 1453      PT SHORT TERM GOAL #1   Title Pt will perform HEP with family's supervision, to address balance, gait, transfers and strengthening.  TARGET 07/01/16   Baseline 06/29/16: met per pt and spouse report   Time --   Period --   Status Achieved     PT SHORT TERM GOAL #2   Title Pt will improve Berg Balance score to at least 26/56 for decreased fall risk.   Baseline 06/29/16: 30/56 scored today   Time --   Period --   Status Achieved     PT SHORT TERM GOAL #3   Title Pt will improve TUG score to less than or equal to 50 seconds for decreased fall risk.   Baseline 06/29/16: 23.81 with RW   Time --   Period --   Status Achieved     PT SHORT TERM GOAL #4   Title Pt will transfer sit<>stand, modified independently, at least 4 of 5 trials, to demo improved lower extremity strength and transfer efficiency.   Baseline  06/29/16: pt at supervision level with cues for safety/sequencing needed at times   Time --   Period --   Status Partially Met     PT SHORT TERM GOAL #5   Title Pt will ambulate at least 200 ft using RW, supervision, for improved gait safety and efficiency.   Baseline 06/29/16: met today with RW   Time --   Period --   Status Achieved     PT SHORT TERM GOAL #6   Title Pt/family  will verbalize understanding of CVA education.   Time 4   Period Weeks   Status On-going           PT Long Term Goals - 06/24/16 1926      PT LONG TERM GOAL #1   Title Pt/family will verbalize understanding of fall prevention in the home environment.  TARGET 07/31/16   Time 8   Period Weeks   Status On-going     PT LONG TERM GOAL #2   Title Pt will improve Berg Balance score to at least 31/56 for decreased fall risk.   Time 8   Period Weeks   Status On-going     PT LONG TERM GOAL #3   Title Pt will improve TUG score to less than or equal to 45 seconds for decreased fall risk.   Time 8   Period Weeks   Status On-going     PT LONG TERM GOAL #4   Title Pt will improve gait velocity to at least 1.8 ft/sec for improved gait efficiency and safety.   Time 8   Period Weeks   Status On-going     PT LONG TERM GOAL #5   Title Pt will ambulate at least 800 ft indoor and outdoor surfaces, using RW, modified independently, for improved outdoor and community.   Time 8   Period Weeks   Status On-going     PT LONG TERM GOAL #6   Title Pt will improve Neuro QOL score on FOTO by at least 20% for improved functional mobility.   Time 8   Period Weeks   Status On-going              Plan - 06/29/16 1453    Clinical Impression Statement Pt has met 4 STGs and partially met another STG to date. Pt is progressing well towards LTGs and should benefit from continued PT to progress toward unmet goals.    Rehab Potential Good   PT Frequency 2x / week   PT Duration 6 weeks   PT Treatment/Interventions  ADLs/Self Care Home Management;Functional mobility training;Stair training;Gait training;Patient/family education;Neuromuscular re-education;Balance training;Therapeutic exercise;Therapeutic activities;DME Instruction   PT Next Visit Plan check any reamaining STGs; safe use of RW during transfers/gait (esp stand to sit); review gastroc stretches and add theraband knee control exercises.    PT Home Exercise Plan son Zenia Resides Guarino, PT) gave standing ex's at counter; pelvic tilts, bridging, seated bil ankle DF stretch, standing knee flexion, hip abdct   Consulted and Agree with Plan of Care Patient;Family member/caregiver   Family Member Consulted Husband      Patient will benefit from skilled therapeutic intervention in order to improve the following deficits and impairments:  Abnormal gait, Decreased balance, Decreased cognition, Decreased knowledge of use of DME, Decreased range of motion, Decreased safety awareness, Decreased strength  Visit Diagnosis: Hemiplegia and hemiparesis following cerebral infarction affecting right dominant side (HCC)  Other abnormalities of gait and mobility  Muscle weakness (generalized)  Unsteadiness on feet     Problem List Patient Active Problem List   Diagnosis Date Noted  . Slow transit constipation   . Acute blood loss anemia   . Adjustment disorder with depressed mood   . Cerebrovascular accident (CVA) ([place]) 02/03/2016  . Dysarthria, post-stroke   . Dysphagia, post-stroke   . Leukocytosis   . Prediabetes   . Right hemiparesis ([place])   . Cerebral infarction due to unspecified mechanism   . Other secondary hypertension   . Overactive bladder   .  Diastolic dysfunction   . Hyperlipidemia   . Essential hypertension   . Essential tremor   . Stroke (cerebrum) ([place]) 02/01/2016  . Facial droop due to stroke 02/01/2016  . Essential and other specified forms of tremor 02/20/2013  . Dysphonia 02/20/2013  . Trigeminal neuralgia 02/20/2013    Willow Ora, PTA, [facility] [address] [place], [place] [address] [phone] 06/30/16, 9:18 AM   Name: Kelly [name] MRN: [id number] Date of Birth: Mar 22, 1928

## 2016-07-02 ENCOUNTER — Encounter: Payer: Self-pay | Admitting: Physical Therapy

## 2016-07-02 ENCOUNTER — Encounter: Payer: Self-pay | Admitting: Occupational Therapy

## 2016-07-02 ENCOUNTER — Ambulatory Visit: Payer: Medicare Other | Admitting: Occupational Therapy

## 2016-07-02 ENCOUNTER — Ambulatory Visit: Payer: Medicare Other | Admitting: Physical Therapy

## 2016-07-02 DIAGNOSIS — R278 Other lack of coordination: Secondary | ICD-10-CM

## 2016-07-02 DIAGNOSIS — I69351 Hemiplegia and hemiparesis following cerebral infarction affecting right dominant side: Secondary | ICD-10-CM

## 2016-07-02 DIAGNOSIS — R2681 Unsteadiness on feet: Secondary | ICD-10-CM | POA: Diagnosis not present

## 2016-07-02 DIAGNOSIS — R2689 Other abnormalities of gait and mobility: Secondary | ICD-10-CM

## 2016-07-02 DIAGNOSIS — M6281 Muscle weakness (generalized): Secondary | ICD-10-CM

## 2016-07-02 DIAGNOSIS — R29818 Other symptoms and signs involving the nervous system: Secondary | ICD-10-CM

## 2016-07-02 NOTE — Patient Instructions (Signed)
ANKLE: Dorsiflexion, Step Unilateral    Stand on step, hang one heel off back of step. Hold _15-20__ seconds. _2-3__ reps on each leg Hold onto a support.  Copyright  VHI. All rights reserved.   Stand with rolling walker with 2-4" block placed on floor in front of feet.  Place right forefoot on 2-4" block and press heel towards floor. Shift hips forward to put weight on right foot.  Hold 15-20 seconds.  Switch to left foot. Repeat 2-3 times on each leg.   

## 2016-07-02 NOTE — Therapy (Signed)
[facility] [address] [place], [place], [address] Phone: [phone]   Fax:  [phone]  Physical Therapy Treatment  Patient Details  Name: Kelly [name] MRN: [id number] Date of Birth: 07/14/1927 Referring Provider: Darrol Angel  Encounter Date: 07/02/2016      PT End of Session - 07/02/16 1446    Visit Number 9   Number of Visits 18   Date for PT Re-Evaluation 07/31/16   Authorization Type Medicare/[employer]-G[employer] every 10th visit   PT Start Time 1400   PT Stop Time 1443   PT Time Calculation (min) 43 min   Behavior During Therapy [facility] for tasks assessed/performed      Past Medical History:  Diagnosis Date  . Acute blood loss anemia   . Adjustment disorder with depressed mood   . CVA (cerebral vascular accident) ([facility]) 02/03/2016   Linear infarct within the posterior limb of left internal capsule  . Diastolic CHF ([facility])   . Dysphagia   . Dysphonia 02/20/2013  . Essential and other specified forms of tremor 02/20/2013  . HLD (hyperlipidemia)   . HTN (hypertension)   . OAB (overactive bladder)   . Osteoporosis   . Patient receiving subcutaneous heparin    For DVT prophylaxis 9/17  . Prediabetes   . Psoriasis   . Slow transit constipation   . Stroke (cerebrum) ([facility]) 02/01/2016  . Trigeminal neuralgia 02/20/2013    Past Surgical History:  Procedure Laterality Date  . APPENDECTOMY  1940  . bladder tack    . CATARACT EXTRACTION Bilateral   . HEMORRHOID SURGERY  1960  . HUMERUS FRACTURE SURGERY    . LAPAROSCOPIC HYSTERECTOMY  2006  . torn rotator cuff    . WRIST FRACTURE SURGERY  2005   seconday shoulder 2001    There were no vitals filed for this visit.      Subjective Assessment - 07/02/16 1404    Subjective Pt has been working her calf stretches, " that's been helping a lot."   Currently in Pain? No/denies                         [facility] Adult PT Treatment/Exercise -  07/02/16 0001      Ambulation/Gait   Ambulation/Gait Yes   Ambulation/Gait Assistance 5: Supervision   Ambulation/Gait Assistance Details cues on posture, to look up and on walker use/position with gait. only 1 episode of right foot catching with all of gait today.   Ambulation Distance (Feet) 350 Feet   Assistive device Rolling walker   Gait Pattern Step-through pattern;Decreased step length - right;Decreased stance time - right;Decreased step length - left;Trunk flexed;Narrow base of support   Ambulation Surface Level;Indoor     Knee/Hip Exercises: Stretches   Theme park manager Both;2 reps;30 seconds  foot on 2"block     Knee/Hip Exercises: Standing   Terminal Knee Extension Limitations Right, 10x2 with yellow band; cues to weight shift right to increase weight bearing and to work on right knee control.     Knee/Hip Exercises: Seated   Hamstring Curl Strengthening;Right;2 sets;10 reps  yellow Theraband             Balance Exercises - 07/02/16 1444      Balance Exercises: Standing   Standing, One Foot on a Step Eyes open;4 inch  left foot on step to work on R knee control progressing with decreased UE support.   Marching Limitations x10 with Right UE only,  needed min A           PT Education - 07/02/16 1603    Education provided Yes   Education Details Discussed options for aerobic exercies; pt is interested in renewing [employer];.   [employer]) Educated Patient;Spouse   Methods Explanation   Comprehension Verbalized understanding          PT Short Term Goals - 06/29/16 1453      PT SHORT TERM GOAL #1   Title Pt will perform HEP with family's supervision, to address balance, gait, transfers and strengthening.  TARGET 07/01/16   Baseline 06/29/16: met per pt and spouse report   Time --   Period --   Status Achieved     PT SHORT TERM GOAL #2   Title Pt will improve Berg Balance score to at least 26/56 for decreased fall risk.   Baseline 06/29/16:  30/56 scored today   Time --   Period --   Status Achieved     PT SHORT TERM GOAL #3   Title Pt will improve TUG score to less than or equal to 50 seconds for decreased fall risk.   Baseline 06/29/16: 23.81 with RW   Time --   Period --   Status Achieved     PT SHORT TERM GOAL #4   Title Pt will transfer sit<>stand, modified independently, at least 4 of 5 trials, to demo improved lower extremity strength and transfer efficiency.   Baseline 06/29/16: pt at supervision level with cues for safety/sequencing needed at times   Time --   Period --   Status Partially Met     PT SHORT TERM GOAL #5   Title Pt will ambulate at least 200 ft using RW, supervision, for improved gait safety and efficiency.   Baseline 06/29/16: met today with RW   Time --   Period --   Status Achieved     PT SHORT TERM GOAL #6   Title Pt/family will verbalize understanding of CVA education.   Time 4   Period Weeks   Status On-going           PT Long Term Goals - 06/24/16 1926      PT LONG TERM GOAL #1   Title Pt/family will verbalize understanding of fall prevention in the home environment.  TARGET 07/31/16   Time 8   Period Weeks   Status On-going     PT LONG TERM GOAL #2   Title Pt will improve Berg Balance score to at least 31/56 for decreased fall risk.   Time 8   Period Weeks   Status On-going     PT LONG TERM GOAL #3   Title Pt will improve TUG score to less than or equal to 45 seconds for decreased fall risk.   Time 8   Period Weeks   Status On-going     PT LONG TERM GOAL #4   Title Pt will improve gait velocity to at least 1.8 ft/sec for improved gait efficiency and safety.   Time 8   Period Weeks   Status On-going     PT LONG TERM GOAL #5   Title Pt will ambulate at least 800 ft indoor and outdoor surfaces, using RW, modified independently, for improved outdoor and community.   Time 8   Period Weeks   Status On-going     PT LONG TERM GOAL #6   Title Pt will improve Neuro QOL  score on FOTO by at least  20% for improved functional mobility.   Time 8   Period Weeks   Status On-going               Plan - 07/02/16 1604    Clinical Impression Statement Pt demonstrated requiring min A with dynamic standing balance with 1 (R) UE support.  Pt is making progress with activity tolerance demonstrated by greater gait distance today.   Rehab Potential Good   PT Frequency 2x / week   PT Duration 6 weeks   PT Treatment/Interventions ADLs/Self Care Home Management;Functional mobility training;Stair training;Gait training;Patient/family education;Neuromuscular re-education;Balance training;Therapeutic exercise;Therapeutic activities;DME Instruction   PT Next Visit Plan G-code; safe use of RW during transfers/gait (esp stand to sit);  add theraband knee control exercises.    PT Home Exercise Plan son Zenia Resides [place], PT) gave standing ex's at counter; pelvic tilts, bridging, seated bil ankle DF stretch, standing knee flexion, hip abdct   Consulted and Agree with Plan of Care Patient;Family member/caregiver   Family Member Consulted Husband      Patient will benefit from skilled therapeutic intervention in order to improve the following deficits and impairments:  Abnormal gait, Decreased balance, Decreased cognition, Decreased knowledge of use of DME, Decreased range of motion, Decreased safety awareness, Decreased strength  Visit Diagnosis: Hemiplegia and hemiparesis following cerebral infarction affecting right dominant side (HCC)  Other abnormalities of gait and mobility  Muscle weakness (generalized)  Unsteadiness on feet     Problem List Patient Active Problem List   Diagnosis Date Noted  . Slow transit constipation   . Acute blood loss anemia   . Adjustment disorder with depressed mood   . Cerebrovascular accident (CVA) ([place]) 02/03/2016  . Dysarthria, post-stroke   . Dysphagia, post-stroke   . Leukocytosis   . Prediabetes   . Right hemiparesis ([place])    . Cerebral infarction due to unspecified mechanism   . Other secondary hypertension   . Overactive bladder   . Diastolic dysfunction   . Hyperlipidemia   . Essential hypertension   . Essential tremor   . Stroke (cerebrum) ([place]) 02/01/2016  . Facial droop due to stroke 02/01/2016  . Essential and other specified forms of tremor 02/20/2013  . Dysphonia 02/20/2013  . Trigeminal neuralgia 02/20/2013    Bjorn Loser, PTA  07/02/16, 4:11 PM [facility] [address] [place], [place], [address] Phone: [phone]   Fax:  [phone]  Name: Kelly [name] MRN: [id number] Date of Birth: 1928/04/28

## 2016-07-02 NOTE — Therapy (Signed)
[facility] [address] [place], [place], [address] Phone: [phone]   Fax:  [phone]  Occupational Therapy Treatment  Patient Details  Name: Kelly [name] MRN: [id number] Date of Birth: [birth year] Referring Provider: Cecille Rubin, NP  Encounter Date: 07/02/2016      OT End of Session - 07/02/16 1712    Visit Number 8   Number of Visits 17   Date for OT Re-Evaluation 07/29/16   Authorization Type Medicare / [employer] (g-code needed)   Authorization - Visit Number 8   Authorization - Number of Visits 10   OT Start Time 1779   OT Stop Time 1529   OT Time Calculation (min) 43 min   Activity Tolerance Patient tolerated treatment well      Past Medical History:  Diagnosis Date  . Acute blood loss anemia   . Adjustment disorder with depressed mood   . CVA (cerebral vascular accident) ([place]) 02/03/2016   Linear infarct within the posterior limb of left internal capsule  . Diastolic CHF ([place])   . Dysphagia   . Dysphonia 02/20/2013  . Essential and other specified forms of tremor 02/20/2013  . HLD (hyperlipidemia)   . HTN (hypertension)   . OAB (overactive bladder)   . Osteoporosis   . Patient receiving subcutaneous heparin    For DVT prophylaxis 9/17  . Prediabetes   . Psoriasis   . Slow transit constipation   . Stroke (cerebrum) ([place]) 02/01/2016  . Trigeminal neuralgia 02/20/2013    Past Surgical History:  Procedure Laterality Date  . APPENDECTOMY  1940  . bladder tack    . CATARACT EXTRACTION Bilateral   . HEMORRHOID SURGERY  1960  . HUMERUS FRACTURE SURGERY    . LAPAROSCOPIC HYSTERECTOMY  2006  . torn rotator cuff    . WRIST FRACTURE SURGERY  2005   seconday shoulder 2001    There were no vitals filed for this visit.      Subjective Assessment - 07/02/16 1452    Subjective  that ball is really helping me    Pertinent History adjustment disorder with depressed mood, diastolic CHF,  osteoporosis, hx of fall, essential tremor, HTN, prediabetes, trigeminal neuralgia   Limitations fall risk   Patient Stated Goals be able to write, fold clothes, do dishes   Currently in Pain? No/denies                      OT Treatments/Exercises (OP) - 07/02/16 0001      ADLs   ADL Comments Addressed remaining STG's - see goal section for updates.  Progress shared with pt and husband.  Pt states she is very pleased with how she is doing.      Neurological Re-education Exercises   Other Exercises 1 Neuro re ed in supine to address RUE proximal stability and mid to overhead reach in closed chain activity with resistance ( 2 pound weight) - pt able to complete 15 reps x2 of chest presses and 12 reps x2 of overhead reach.  Transitioned to sitting and addressed  mid reach with functional tasks - pt  with min compensations intitially however with fatigue flexion synergy pattern increases.  Pt's performance impacted by task demand for hand orientation as well.               Balance Exercises - 07/02/16 1444      Balance Exercises: Standing   Standing, One Foot on a Step Eyes open;4 inch  left foot on step to work on R knee control progressing with   Marching Limitations x10 with Right UE only, needed min A             OT Short Term Goals - 07/02/16 1659      OT SHORT TERM GOAL #1   Title Pt will be independent with initial HEP--check STGs 06/30/16   Time 4   Period Weeks   Status Achieved     OT SHORT TERM GOAL #2   Title Pt will be able to write name with at least 75% legibility.   Time 4   Period Weeks   Status Achieved  06/29/16:  met approx this level or higher for name (inconsistent at times)     OT SHORT TERM GOAL #3   Title Pt will demo at least 95* R shoulder flex with min compensation in prep for functional reach.   Time 4   Period Weeks   Status Achieved  06/29/16:  met at approx this level.     OT SHORT TERM GOAL #4   Title Pt will improve  functional reaching and grasp/release as shown by improving score on box and blocks by at least 5.   Baseline 3 blocks   Time 4   Period Weeks   Status Achieved  07/02/2016 21 blocks           OT Long Term Goals - 07/02/16 1703      OT LONG TERM GOAL #1   Title Pt will be independent with updated HEP--07/30/16   Time 8   Period Weeks   Status On-going     OT LONG TERM GOAL #2   Title Pt will be able to write name with at least 95% legibility and simple sentence with at least 75% legibility.   Time 8   Period Weeks   Status On-going     OT LONG TERM GOAL #3   Title Pt will demo at least 100* R shoulder flex with min compensation in prep for functional reach.   Period Weeks   Status On-going     OT LONG TERM GOAL #4   Title Pt will improve functional reaching and grasp/release as shown by improving score on box and blocks by at least 10.   Baseline 3 blocks   Time 8   Period Weeks   Status On-going     OT LONG TERM GOAL #5   Title Pt will be able to use dominant RUE for grooming tasks at least 75% of the time.   Time 8   Period Weeks   Status On-going     OT LONG TERM GOAL #6   Title Pt will perform simple home maintenance tasks with supervision.   Time 8   Period Weeks   Status On-going               Plan - 07/02/16 1703    Clinical Impression Statement Pt has met all STG's and is now progressing toward LTG's.  Pt states she really wants to work on doing the dishes.     Rehab Potential Good   OT Frequency 2x / week   OT Duration 8 weeks   OT Treatment/Interventions Self-care/ADL training;Therapeutic exercise;DME and/or AE instruction;Therapist, nutritional;Therapeutic activities;Patient/family education;Balance training;Neuromuscular education;Electrical Stimulation;Moist Heat;Energy conservation;Passive range of motion;Therapeutic exercises;Splinting;Manual Therapy;Fluidtherapy;Ultrasound;Cryotherapy;Parrafin   Plan ktichen activity, balance, NMR for  RUE/trunk for mid to beginning high reach   Consulted and Agree with Plan of Care Patient;Family  member/caregiver   Family Member Consulted husband      Patient will benefit from skilled therapeutic intervention in order to improve the following deficits and impairments:  Decreased coordination, Decreased activity tolerance, Decreased knowledge of use of DME, Decreased strength, Impaired UE functional use, Impaired tone, Decreased range of motion, Decreased balance, Decreased mobility, Difficulty walking  Visit Diagnosis: Hemiplegia and hemiparesis following cerebral infarction affecting right dominant side (HCC)  Muscle weakness (generalized)  Unsteadiness on feet  Other symptoms and signs involving the nervous system  Other lack of coordination    Problem List Patient Active Problem List   Diagnosis Date Noted  . Slow transit constipation   . Acute blood loss anemia   . Adjustment disorder with depressed mood   . Cerebrovascular accident (CVA) ([facility]) 02/03/2016  . Dysarthria, post-stroke   . Dysphagia, post-stroke   . Leukocytosis   . Prediabetes   . Right hemiparesis ([facility])   . Cerebral infarction due to unspecified mechanism   . Other secondary hypertension   . Overactive bladder   . Diastolic dysfunction   . Hyperlipidemia   . Essential hypertension   . Essential tremor   . Stroke (cerebrum) ([facility]) 02/01/2016  . Facial droop due to stroke 02/01/2016  . Essential and other specified forms of tremor 02/20/2013  . Dysphonia 02/20/2013  . Trigeminal neuralgia 02/20/2013    Quay Burow, OTR/L 07/02/2016, 5:13 PM  [facility] [address] [employer] [place], [place], [address] Phone: [phone]   Fax:  [phone]  Name: Kelly [name] MRN: [id number] Date of Birth: Jan 22, 1928

## 2016-07-07 ENCOUNTER — Encounter: Payer: Self-pay | Admitting: Occupational Therapy

## 2016-07-07 ENCOUNTER — Ambulatory Visit: Payer: Medicare Other | Admitting: Occupational Therapy

## 2016-07-07 ENCOUNTER — Ambulatory Visit: Payer: Medicare Other | Admitting: Physical Therapy

## 2016-07-07 DIAGNOSIS — R2681 Unsteadiness on feet: Secondary | ICD-10-CM | POA: Diagnosis not present

## 2016-07-07 DIAGNOSIS — M6281 Muscle weakness (generalized): Secondary | ICD-10-CM

## 2016-07-07 DIAGNOSIS — R29818 Other symptoms and signs involving the nervous system: Secondary | ICD-10-CM | POA: Diagnosis not present

## 2016-07-07 DIAGNOSIS — R2689 Other abnormalities of gait and mobility: Secondary | ICD-10-CM

## 2016-07-07 DIAGNOSIS — I69351 Hemiplegia and hemiparesis following cerebral infarction affecting right dominant side: Secondary | ICD-10-CM | POA: Diagnosis not present

## 2016-07-07 DIAGNOSIS — R278 Other lack of coordination: Secondary | ICD-10-CM

## 2016-07-07 NOTE — Therapy (Signed)
[facility] [address] [place], [place], [address] Phone: [phone]   Fax:  [phone]  Occupational Therapy Treatment  Patient Details  Name: Kelly [name] MRN: [id number] Date of Birth: 06/21/1927 Referring Provider: Cecille Rubin, NP  Encounter Date: 07/07/2016      OT End of Session - 07/07/16 1629    Visit Number 9   Number of Visits 17   Date for OT Re-Evaluation 07/29/16   Authorization Type Medicare / [employer] (g-code needed)   Authorization - Visit Number 9   Authorization - Number of Visits 10   OT Start Time 1401   OT Stop Time 1443   OT Time Calculation (min) 42 min   Activity Tolerance Patient tolerated treatment well      Past Medical History:  Diagnosis Date  . Acute blood loss anemia   . Adjustment disorder with depressed mood   . CVA (cerebral vascular accident) ([facility]) 02/03/2016   Linear infarct within the posterior limb of left internal capsule  . Diastolic CHF ([place])   . Dysphagia   . Dysphonia 02/20/2013  . Essential and other specified forms of tremor 02/20/2013  . HLD (hyperlipidemia)   . HTN (hypertension)   . OAB (overactive bladder)   . Osteoporosis   . Patient receiving subcutaneous heparin    For DVT prophylaxis 9/17  . Prediabetes   . Psoriasis   . Slow transit constipation   . Stroke (cerebrum) ([place]) 02/01/2016  . Trigeminal neuralgia 02/20/2013    Past Surgical History:  Procedure Laterality Date  . APPENDECTOMY  1940  . bladder tack    . CATARACT EXTRACTION Bilateral   . HEMORRHOID SURGERY  1960  . HUMERUS FRACTURE SURGERY    . LAPAROSCOPIC HYSTERECTOMY  2006  . torn rotator cuff    . WRIST FRACTURE SURGERY  2005   seconday shoulder 2001    There were no vitals filed for this visit.      Subjective Assessment - 07/07/16 1406    Subjective  I really have to work on my writing   Patient is accompained by: Family member  husband   Pertinent History  adjustment disorder with depressed mood, diastolic CHF, osteoporosis, hx of fall, essential tremor, HTN, prediabetes, trigeminal neuralgia   Limitations fall risk   Patient Stated Goals be able to write, fold clothes, do dishes   Currently in Pain? No/denies                      OT Treatments/Exercises (OP) - 07/07/16 0001      ADLs   Home Maintenance Addressed washing dishes with emphasis on using RUE functionally, standing balance, activity tolerance, and using vision to compensate for sensory loss in RUE.  Pt also needed intermittent facilitation to reach to place objects into drainer and for releasing of objects.  Pt able to tolerate approximately 12 minutes of activity and reported 8/10 fatigue level.  HR returned to normal within 5 minute rest and pt stated "I feel great!"   Writing Addressed writing - tried various adaptations - pt performed best using coban wrapped around pencil to build up pencil slightly and prevent fingers from slipping on pencil.  Issued pt package of coban for home use. Also used strategies of working on Clinical cytogeneticist with large letters/numbers.  After significant repetition and practice pt able to print name with 100% legibility and write numbers 1-10 with 100% legibility.  OT Short Term Goals - 07/07/16 1627      OT SHORT TERM GOAL #1   Title Pt will be independent with initial HEP--check STGs 06/30/16   Time 4   Period Weeks   Status Achieved     OT SHORT TERM GOAL #2   Title Pt will be able to write name with at least 75% legibility.   Time 4   Period Weeks   Status Achieved  06/29/16:  met approx this level or higher for name (inconsistent at times)     OT SHORT TERM GOAL #3   Title Pt will demo at least 95* R shoulder flex with min compensation in prep for functional reach.   Time 4   Period Weeks   Status Achieved  06/29/16:  met at approx this level.     OT SHORT TERM GOAL #4   Title Pt will  improve functional reaching and grasp/release as shown by improving score on box and blocks by at least 5.   Baseline 3 blocks   Time 4   Period Weeks   Status Achieved  07/02/2016 21 blocks           OT Long Term Goals - 07/07/16 1627      OT LONG TERM GOAL #1   Title Pt will be independent with updated HEP--07/30/16   Time 8   Period Weeks   Status On-going     OT LONG TERM GOAL #2   Title Pt will be able to write name with at least 95% legibility and simple sentence with at least 75% legibility.   Time 8   Period Weeks   Status On-going     OT LONG TERM GOAL #3   Title Pt will demo at least 100* R shoulder flex with min compensation in prep for functional reach.   Period Weeks   Status On-going     OT LONG TERM GOAL #4   Title Pt will improve functional reaching and grasp/release as shown by improving score on box and blocks by at least 10.   Baseline 3 blocks   Time 8   Period Weeks   Status On-going     OT LONG TERM GOAL #5   Title Pt will be able to use dominant RUE for grooming tasks at least 75% of the time.   Time 8   Period Weeks   Status On-going     OT LONG TERM GOAL #6   Title Pt will perform simple home maintenance tasks with supervision.   Time 8   Period Weeks   Status On-going               Plan - 07/07/16 1628    Clinical Impression Statement Pt progressing toward goals - pt is extremely motivated to reach LTG's.    Rehab Potential Good   OT Frequency 2x / week   OT Duration 8 weeks   OT Treatment/Interventions Self-care/ADL training;Therapeutic exercise;DME and/or AE instruction;Therapist, nutritional;Therapeutic activities;Patient/family education;Balance training;Neuromuscular education;Electrical Stimulation;Moist Heat;Energy conservation;Passive range of motion;Therapeutic exercises;Splinting;Manual Therapy;Fluidtherapy;Ultrasound;Cryotherapy;Parrafin   Plan balance, activity tolerance, NMR for RUE/trunk for mid to beginning  high reach   Consulted and Agree with Plan of Care Patient;Family member/caregiver   Family Member Consulted husband      Patient will benefit from skilled therapeutic intervention in order to improve the following deficits and impairments:  Decreased coordination, Decreased activity tolerance, Decreased knowledge of use of DME, Decreased strength, Impaired UE functional use, Impaired tone,  Decreased range of motion, Decreased balance, Decreased mobility, Difficulty walking  Visit Diagnosis: Hemiplegia and hemiparesis following cerebral infarction affecting right dominant side (HCC)  Muscle weakness (generalized)  Unsteadiness on feet  Other symptoms and signs involving the nervous system  Other lack of coordination    Problem List Patient Active Problem List   Diagnosis Date Noted  . Slow transit constipation   . Acute blood loss anemia   . Adjustment disorder with depressed mood   . Cerebrovascular accident (CVA) ([facility]) 02/03/2016  . Dysarthria, post-stroke   . Dysphagia, post-stroke   . Leukocytosis   . Prediabetes   . Right hemiparesis ([facility])   . Cerebral infarction due to unspecified mechanism   . Other secondary hypertension   . Overactive bladder   . Diastolic dysfunction   . Hyperlipidemia   . Essential hypertension   . Essential tremor   . Stroke (cerebrum) ([facility]) 02/01/2016  . Facial droop due to stroke 02/01/2016  . Essential and other specified forms of tremor 02/20/2013  . Dysphonia 02/20/2013  . Trigeminal neuralgia 02/20/2013    Quay Burow, OTR/L 07/07/2016, 4:32 PM  [facility] [address] [place], [place], [address] Phone: [phone]   Fax:  [phone]  Name: CHANELE DOUGLAS MRN: [id number] Date of Birth: 01-08-1928

## 2016-07-07 NOTE — Therapy (Signed)
[facility] [address] [place], [place], [address] Phone: [phone]   Fax:  [phone]  Physical Therapy Treatment  Patient Details  Name: Kelly [name] MRN: [id number] Date of Birth: 11-03-1927 Referring Provider: Cecille Rubin  Encounter Date: 07/07/2016      PT End of Session - 07/07/16 2209    Visit Number 10   Number of Visits 18   Date for PT Re-Evaluation 07/31/16   Authorization Type Medicare/[employer]-G[employer] every 10th visit   PT Start Time 1445   PT Stop Time 1532   PT Time Calculation (min) 47 min   Activity Tolerance Patient tolerated treatment well   Behavior During Therapy [facility] for tasks assessed/performed      Past Medical History:  Diagnosis Date  . Acute blood loss anemia   . Adjustment disorder with depressed mood   . CVA (cerebral vascular accident) ([place]) 02/03/2016   Linear infarct within the posterior limb of left internal capsule  . Diastolic CHF ([place])   . Dysphagia   . Dysphonia 02/20/2013  . Essential and other specified forms of tremor 02/20/2013  . HLD (hyperlipidemia)   . HTN (hypertension)   . OAB (overactive bladder)   . Osteoporosis   . Patient receiving subcutaneous heparin    For DVT prophylaxis 9/17  . Prediabetes   . Psoriasis   . Slow transit constipation   . Stroke (cerebrum) ([place]) 02/01/2016  . Trigeminal neuralgia 02/20/2013    Past Surgical History:  Procedure Laterality Date  . APPENDECTOMY  1940  . bladder tack    . CATARACT EXTRACTION Bilateral   . HEMORRHOID SURGERY  1960  . HUMERUS FRACTURE SURGERY    . LAPAROSCOPIC HYSTERECTOMY  2006  . torn rotator cuff    . WRIST FRACTURE SURGERY  2005   seconday shoulder 2001    There were no vitals filed for this visit.      Subjective Assessment - 07/07/16 2138    Subjective Patient excited that she was able to write her name during OT session prior to PT. Pt/husband denied recent falls or significant  LOB. Obtained youth RW from [place]   Patient is accompained by: Family member   Patient Stated Goals Pt's goal for therapy is to walk again.   Currently in Pain? No/denies                         [facility] Adult PT Treatment/Exercise - 07/07/16 0001      Transfers   Transfers Sit to Stand;Stand to Sit   Sit to Stand With upper extremity assist;From bed;6: Modified independent (Device/Increase time)   Sit to Stand Details (indicate cue type and reason) proper technique/sequencing with RW 100% of trials   Stand to Sit 4: Min guard   Stand to Sit Details 4 of 5 trials during session with correct sequencing without cues; x 1 pt with LOB during stnd to sit as she began sitting while she was turning to align with the bed. While combining these movements, she lost control and recovered without outside assist.     Ambulation/Gait   Ambulation/Gait Assistance 5: Supervision;4: Min assist   Ambulation/Gait Assistance Details with RW with vc for upright posture, proximity to RW, and Rt knee control (prevent genu recurvatum); with Rt hand held assist onto red mat for balance ex's on compliant surface   Ambulation Distance (Feet) 220 Feet  30 x 4 with seated rests  Assistive device Rolling walker;1 person hand held assist   Gait Pattern Step-through pattern;Decreased step length - right;Decreased stance time - right;Decreased step length - left;Trunk flexed;Narrow base of support;Right genu recurvatum;Poor foot clearance - left;Poor foot clearance - right   Ambulation Surface Level;Indoor     Exercises   Exercises Knee/Hip     Knee/Hip Exercises: Standing   Terminal Knee Extension Strengthening;Right;2 sets;10 reps;Theraband  single UE support; slow, tactile cues to avoid genu recurvat   Theraband Level (Terminal Knee Extension) Level 1 (Yellow)     Knee/Hip Exercises: Seated   Hamstring Curl Strengthening;Right;2 sets;10 reps  yellow t band             Balance  Exercises - 07/07/16 2154      Balance Exercises: Standing   Standing Eyes Opened Narrow base of support (BOS);Head turns;Foam/compliant surface;30 secs  30 sec no head turns no UE support on red mat; 30sec horiz   Standing Eyes Closed Wide (BOA);Foam/compliant surface;30 secs  progress bil UE support, single UE, to none   Tandem Stance Eyes open;Upper extremity support 1;Foam/compliant surface;30 secs  single finger on chair back for support; x1 each leg behind   Balance Beam wide BOS, 1 1/2" red/gray beam horizontally placed; x 30 sec single UE to no UE support x 30 sec           PT Education - 07/07/16 2207    Education provided Yes   Education Details Added hamstring curl RLE for knee strengthening/control to HEP   Person(s) Educated Patient;Spouse   Methods Explanation;Demonstration;Verbal cues;Handout   Comprehension Verbalized understanding;Returned demonstration;Need further instruction          PT Short Term Goals - 06/29/16 1453      PT SHORT TERM GOAL #1   Title Pt will perform HEP with family's supervision, to address balance, gait, transfers and strengthening.  TARGET 07/01/16   Baseline 06/29/16: met per pt and spouse report   Time --   Period --   Status Achieved     PT SHORT TERM GOAL #2   Title Pt will improve Berg Balance score to at least 26/56 for decreased fall risk.   Baseline 06/29/16: 30/56 scored today   Time --   Period --   Status Achieved     PT SHORT TERM GOAL #3   Title Pt will improve TUG score to less than or equal to 50 seconds for decreased fall risk.   Baseline 06/29/16: 23.81 with RW   Time --   Period --   Status Achieved     PT SHORT TERM GOAL #4   Title Pt will transfer sit<>stand, modified independently, at least 4 of 5 trials, to demo improved lower extremity strength and transfer efficiency.   Baseline 06/29/16: pt at supervision level with cues for safety/sequencing needed at times   Time --   Period --   Status Partially Met      PT SHORT TERM GOAL #5   Title Pt will ambulate at least 200 ft using RW, supervision, for improved gait safety and efficiency.   Baseline 06/29/16: met today with RW   Time --   Period --   Status Achieved     PT SHORT TERM GOAL #6   Title Pt/family will verbalize understanding of CVA education.   Time 4   Period Weeks   Status On-going           PT Long Term Goals - 06/24/16 1926  PT LONG TERM GOAL #1   Title Pt/family will verbalize understanding of fall prevention in the home environment.  TARGET 07/31/16   Time 8   Period Weeks   Status On-going     PT LONG TERM GOAL #2   Title Pt will improve Berg Balance score to at least 31/56 for decreased fall risk.   Time 8   Period Weeks   Status On-going     PT LONG TERM GOAL #3   Title Pt will improve TUG score to less than or equal to 45 seconds for decreased fall risk.   Time 8   Period Weeks   Status On-going     PT LONG TERM GOAL #4   Title Pt will improve gait velocity to at least 1.8 ft/sec for improved gait efficiency and safety.   Time 8   Period Weeks   Status On-going     PT LONG TERM GOAL #5   Title Pt will ambulate at least 800 ft indoor and outdoor surfaces, using RW, modified independently, for improved outdoor and community.   Time 8   Period Weeks   Status On-going     PT LONG TERM GOAL #6   Title Pt will improve Neuro QOL score on FOTO by at least 20% for improved functional mobility.   Time 8   Period Weeks   Status On-going               Plan - 07/13/16 08/05/2209    Clinical Impression Statement Patient is highly motivated with excellent support from her husband. She continues to improve and did extremely well with balance activities on compliant surfaces (red mat vs red/gray balance beam). Patient with minimal sway and independently righted herself both EO and EC. Continue to work towards LTG's   Rehab Potential Good   PT Frequency 2x / week   PT Duration 6 weeks   PT  Treatment/Interventions ADLs/Self Care Home Management;Functional mobility training;Stair training;Gait training;Patient/family education;Neuromuscular re-education;Balance training;Therapeutic exercise;Therapeutic activities;DME Instruction   PT Next Visit Plan safe use of RW during approach to and during stand to sit; standing theraband knee control exercises (incr to red or green band); review seated hamstring exercise given for HEP; Rt knee control during gait;    PT Home Exercise Plan son Zenia Resides Nolting, PT) gave standing ex's at counter; pelvic tilts, bridging, standing bil ankle DF stretch w/ 2" block, standing knee flexion, hip abdct; seated knee flexion with tband   Consulted and Agree with Plan of Care Patient;Family member/caregiver   Family Member Consulted Husband      Patient will benefit from skilled therapeutic intervention in order to improve the following deficits and impairments:  Abnormal gait, Decreased balance, Decreased cognition, Decreased knowledge of use of DME, Decreased range of motion, Decreased safety awareness, Decreased strength  Visit Diagnosis: Hemiplegia and hemiparesis following cerebral infarction affecting right dominant side (HCC)  Muscle weakness (generalized)  Unsteadiness on feet  Other abnormalities of gait and mobility       G-Codes - 07/13/16 08/05/17    Functional Assessment Tool Used 06/01/16 TUG 55.71 sec, 06/29/16 23.81;  1/8 Berg 21/56,  2/5  30/56   Functional Limitation Mobility: Walking and moving around   Mobility: Walking and Moving Around Current Status [phone]) At least 40 percent but less than 60 percent impaired, limited or restricted   Mobility: Walking and Moving Around Goal Status [phone]) At least 20 percent but less than 40 percent impaired, limited or restricted  Problem List Patient Active Problem List   Diagnosis Date Noted  . Slow transit constipation   . Acute blood loss anemia   . Adjustment disorder with depressed mood    . Cerebrovascular accident (CVA) ([place]) 02/03/2016  . Dysarthria, post-stroke   . Dysphagia, post-stroke   . Leukocytosis   . Prediabetes   . Right hemiparesis ([place])   . Cerebral infarction due to unspecified mechanism   . Other secondary hypertension   . Overactive bladder   . Diastolic dysfunction   . Hyperlipidemia   . Essential hypertension   . Essential tremor   . Stroke (cerebrum) ([place]) 02/01/2016  . Facial droop due to stroke 02/01/2016  . Essential and other specified forms of tremor 02/20/2013  . Dysphonia 02/20/2013  . Trigeminal neuralgia 02/20/2013   Physical Therapy Progress Note  Dates of Reporting Period: 06/01/16 to 07/07/2016  Objective Reports of Subjective Statement: Patient excited about her progress (see standardized test results 06/29/16)  Objective Measurements: 06/01/16 TUG 55.71 sec,   06/29/16 23.81;  1/8 Berg 21/56,  2/5  30/56  Goal Update: see LTGs above (remain appropriate)  Plan: see above  Reason Skilled Services are Required: Patient's scores on TUG and Berg Balance Assessment continue to indicate high fall risk (>13.5 sec on TUG; <45 berg)   Rexanne Mano, PT 07/07/2016, 10:31 PM  [facility] [address] [place], [place], [address] Phone: [phone]   Fax:  [phone]  Name: Kelly [name] MRN: [id number] Date of Birth: 02-12-1928

## 2016-07-07 NOTE — Telephone Encounter (Signed)
x

## 2016-07-07 NOTE — Patient Instructions (Addendum)
Hamstring Curl: Resisted (Sitting)    Sit with both feet on ottoman and yellow band around both ankles. Take right foot off ottoman and bend right knee until foot is on the floor. Slowly let knee straighten out. Repeat __15__ times per set. Do __3__ sets per session. Do __1__ sessions per day.   http://orth.exer.us/669   Copyright  VHI. All rights reserved.

## 2016-07-09 ENCOUNTER — Ambulatory Visit: Payer: Medicare Other | Admitting: Occupational Therapy

## 2016-07-09 ENCOUNTER — Ambulatory Visit: Payer: Medicare Other | Admitting: Physical Therapy

## 2016-07-09 DIAGNOSIS — I69351 Hemiplegia and hemiparesis following cerebral infarction affecting right dominant side: Secondary | ICD-10-CM

## 2016-07-09 DIAGNOSIS — R2689 Other abnormalities of gait and mobility: Secondary | ICD-10-CM

## 2016-07-09 DIAGNOSIS — M6281 Muscle weakness (generalized): Secondary | ICD-10-CM | POA: Diagnosis not present

## 2016-07-09 DIAGNOSIS — R29818 Other symptoms and signs involving the nervous system: Secondary | ICD-10-CM

## 2016-07-09 DIAGNOSIS — R2681 Unsteadiness on feet: Secondary | ICD-10-CM

## 2016-07-09 DIAGNOSIS — R278 Other lack of coordination: Secondary | ICD-10-CM | POA: Diagnosis not present

## 2016-07-09 NOTE — Patient Instructions (Signed)
Backward Walking    Walk backward, toes of each foot coming down first. Take long, even strides. Walk around your pool table, holding on with one hand. Make sure you have a clear pathway with no obstructions when you do this.   Copyright  VHI. All rights reserved.

## 2016-07-09 NOTE — Therapy (Addendum)
[facility] [address] [place], [place], [address] Phone: [phone]   Fax:  [phone]  Physical Therapy Treatment  Patient Details  Name: Kelly [name] MRN: [id number] Date of Birth: 1928-01-11 Referring Provider: Cecille Rubin  Encounter Date: 07/09/2016      PT End of Session - 07/09/16 1914    Visit Number 10   Number of Visits 18   Date for PT Re-Evaluation 07/31/16   Authorization Type Medicare/[employer]-GCODE every 10th visit   PT Start Time 1403   PT Stop Time 1447   PT Time Calculation (min) 44 min   Activity Tolerance Patient tolerated treatment well   Behavior During Therapy [facility] for tasks assessed/performed      Past Medical History:  Diagnosis Date  . Acute blood loss anemia   . Adjustment disorder with depressed mood   . CVA (cerebral vascular accident) ([place]) 02/03/2016   Linear infarct within the posterior limb of left internal capsule  . Diastolic CHF ([place])   . Dysphagia   . Dysphonia 02/20/2013  . Essential and other specified forms of tremor 02/20/2013  . HLD (hyperlipidemia)   . HTN (hypertension)   . OAB (overactive bladder)   . Osteoporosis   . Patient receiving subcutaneous heparin    For DVT prophylaxis 9/17  . Prediabetes   . Psoriasis   . Slow transit constipation   . Stroke (cerebrum) ([place]) 02/01/2016  . Trigeminal neuralgia 02/20/2013    Past Surgical History:  Procedure Laterality Date  . APPENDECTOMY  1940  . bladder tack    . CATARACT EXTRACTION Bilateral   . HEMORRHOID SURGERY  1960  . HUMERUS FRACTURE SURGERY    . LAPAROSCOPIC HYSTERECTOMY  2006  . torn rotator cuff    . WRIST FRACTURE SURGERY  2005   seconday shoulder 2001    There were no vitals filed for this visit.      Subjective Assessment - 07/09/16 1410    Subjective Reports no changes. More tired today due to went out to 7 pm movie last night and got to sleep late. Reports no difficulty with  walking on sidewalks, in parking lot during her more frequent outings.   Patient is accompained by: Family member   Patient Stated Goals Pt's goal for therapy is to walk again.   Currently in Pain? No/denies                         [facility] Adult PT Treatment/Exercise - 07/09/16 1423      Transfers   Transfers Sit to Stand;Stand to Sit   Sit to Stand 5: Supervision   Sit to Stand Details (indicate cue type and reason) subtle cues x 2 of 6 reps for proper hand placement with RW   Stand to Sit 5: Supervision     Ambulation/Gait   Ambulation/Gait Assistance 5: Supervision   Ambulation/Gait Assistance Details maintains Rt knee control with vc x 97f and then hyperextends ~75% of steps   Ambulation Distance (Feet) 110 Feet  30, 80   Assistive device Rolling walker   Gait Pattern Step-through pattern;Decreased step length - right;Decreased stance time - right;Decreased step length - left;Trunk flexed;Narrow base of support;Right genu recurvatum;Poor foot clearance - left;Poor foot clearance - right   Ambulation Surface Level;Indoor   Pre-Gait Activities wt-shifting anterior-posterior in stagger stance focusing on hip/knee extension of front leg (without genu recurvatum) and with posterior shift lifting/DF of front foot; x  10 reps each leg leading     High Level Balance   High Level Balance Activities Side stepping;Backward walking   High Level Balance Comments walking around rectangle table (~8x4 ft) with single UE support; backwards x 80 ft emphasizing long step, toes touch down first, and knee control; sidestepping x 80 ft (40' lt, 40' rt)     Knee/Hip Exercises: Aerobic   Nustep L1, 5 minutes alternating pushing with feet and then pulling with feet (working bil LE flexion)     Ankle Exercises: Stretches   Soleus Stretch 1 rep  x 2 minutes   Soleus Stretch Limitations seated, forefoot on 2" step and heels pressing down to the floor                PT Education -  07/09/16 1911    Education provided Yes   Education Details Added walking backwards with single UE support around pool table to HEP; re: genu recurvatum risks and strategies for knee control and strengthening hamstrings   Person(s) Educated Patient;Spouse   Methods Explanation;Demonstration;Tactile cues;Verbal cues;Handout   Comprehension Verbalized understanding;Returned demonstration;Need further instruction          PT Short Term Goals - 06/29/16 1453      PT SHORT TERM GOAL #1   Title Pt will perform HEP with family's supervision, to address balance, gait, transfers and strengthening.  TARGET 07/01/16   Baseline 06/29/16: met per pt and spouse report   Time --   Period --   Status Achieved     PT SHORT TERM GOAL #2   Title Pt will improve Berg Balance score to at least 26/56 for decreased fall risk.   Baseline 06/29/16: 30/56 scored today   Time --   Period --   Status Achieved     PT SHORT TERM GOAL #3   Title Pt will improve TUG score to less than or equal to 50 seconds for decreased fall risk.   Baseline 06/29/16: 23.81 with RW   Time --   Period --   Status Achieved     PT SHORT TERM GOAL #4   Title Pt will transfer sit<>stand, modified independently, at least 4 of 5 trials, to demo improved lower extremity strength and transfer efficiency.   Baseline 06/29/16: pt at supervision level with cues for safety/sequencing needed at times   Time --   Period --   Status Partially Met     PT SHORT TERM GOAL #5   Title Pt will ambulate at least 200 ft using RW, supervision, for improved gait safety and efficiency.   Baseline 06/29/16: met today with RW   Time --   Period --   Status Achieved     PT SHORT TERM GOAL #6   Title Pt/family will verbalize understanding of CVA education.   Time 4   Period Weeks   Status On-going           PT Long Term Goals - 06/24/16 1926      PT LONG TERM GOAL #1   Title Pt/family will verbalize understanding of fall prevention in the home  environment.  TARGET 07/31/16   Time 8   Period Weeks   Status On-going     PT LONG TERM GOAL #2   Title Pt will improve Berg Balance score to at least 31/56 for decreased fall risk.   Time 8   Period Weeks   Status On-going     PT LONG TERM GOAL #3  Title Pt will improve TUG score to less than or equal to 45 seconds for decreased fall risk.   Time 8   Period Weeks   Status On-going     PT LONG TERM GOAL #4   Title Pt will improve gait velocity to at least 1.8 ft/sec for improved gait efficiency and safety.   Time 8   Period Weeks   Status On-going     PT LONG TERM GOAL #5   Title Pt will ambulate at least 800 ft indoor and outdoor surfaces, using RW, modified independently, for improved outdoor and community.   Time 8   Period Weeks   Status On-going     PT LONG TERM GOAL #6   Title Pt will improve Neuro QOL score on FOTO by at least 20% for improved functional mobility.   Time 8   Period Weeks   Status On-going               Plan - 07-12-16 1915    Clinical Impression Statement Focus of session on gait training and strengthening to reduce Rt genu recurvatum in stance phase and dragging rt foot during swing phase. Patient with multiple episodes of dragging/catching her rt foot today with + LOB requiring assist to recover. ?due to general fatigue as pt reported vs fatigue from activities during session.    Rehab Potential Good   PT Frequency 2x / week   PT Duration 6 weeks   PT Treatment/Interventions ADLs/Self Care Home Management;Functional mobility training;Stair training;Gait training;Patient/family education;Neuromuscular re-education;Balance training;Therapeutic exercise;Therapeutic activities;DME Instruction   PT Next Visit Plan cont safe use of RW during transfers; standing theraband knee control exercises (incr to red or green band); Rt knee control and rt foot clearance during gait; add Rt ankle DF strengthening to HEP   PT Home Exercise Plan son Zenia Resides  Rufo, PT) gave standing ex's at counter; pelvic tilts, bridging, standing bil ankle DF stretch w/ 2" block, standing knee flexion, hip abdct; seated knee flexion with tband, backward walking   Consulted and Agree with Plan of Care Patient;Family member/caregiver   Family Member Consulted Husband      Patient will benefit from skilled therapeutic intervention in order to improve the following deficits and impairments:  Abnormal gait, Decreased balance, Decreased cognition, Decreased knowledge of use of DME, Decreased range of motion, Decreased safety awareness, Decreased strength  Visit Diagnosis: Hemiplegia and hemiparesis following cerebral infarction affecting right dominant side (HCC)  Muscle weakness (generalized)  Unsteadiness on feet  G-Codes - 2016-07-12 2104-08-02     Functional Assessment Tool Used 06/01/16 TUG 55.71 sec,  06/29/16 23.81 sec; 06/01/16 Berg 21/56, 06/29/16 Merrilee Jansky 30/56    Functional Limitation Mobility: Walking and moving around    Mobility: Walking and Moving Around Current Status [phone]) At least 40 percent but less than 60 percent impaired, limited or restricted    Mobility: Walking and Moving Around Goal Status [phone]) At least 20 percent but less than 40 percent impaired, limited or restricted        Problem List Patient Active Problem List   Diagnosis Date Noted  . Slow transit constipation   . Acute blood loss anemia   . Adjustment disorder with depressed mood   . Cerebrovascular accident (CVA) ([place]) 02/03/2016  . Dysarthria, post-stroke   . Dysphagia, post-stroke   . Leukocytosis   . Prediabetes   . Right hemiparesis ([place])   . Cerebral infarction due to unspecified mechanism   . Other secondary hypertension   .  Overactive bladder   . Diastolic dysfunction   . Hyperlipidemia   . Essential hypertension   . Essential tremor   . Stroke (cerebrum) ([place]) 02/01/2016  . Facial droop due to stroke 02/01/2016  . Essential and other specified forms of tremor  02/20/2013  . Dysphonia 02/20/2013  . Trigeminal neuralgia 02/20/2013   Physical Therapy Progress Note  Dates of Reporting Period: 06/01/16 to 07/09/16  Objective Reports of Subjective Statement: Patient reports her walking has improved; TUG test decreased from 55.71 sec to 23.81 sec  Objective Measurements: See above  Goal Update:   Plan: See above  Reason Skilled Services are Required: TUG >13.5 sec indicative of high fall risk; pt currently 23.81 sec. Continue gait and balance training   Rexanne Mano, PT 07/09/2016, 7:23 PM  [facility] [address] [place], [place], [address] Phone: [phone]   Fax:  [phone]  Name: Kelly [name] MRN: [id number] Date of Birth: [birth year]

## 2016-07-09 NOTE — Therapy (Signed)
[facility] [address] [place], [place], [address] Phone: [phone]   Fax:  [phone]  Occupational Therapy Treatment  Patient Details  Name: Kelly [name] MRN: [id number] Date of Birth: 1928-03-07 Referring Provider: Cecille Rubin, NP  Encounter Date: 07/09/2016      OT End of Session - 07/09/16 1459    Visit Number 10   Number of Visits 17   Date for OT Re-Evaluation 07/29/16   Authorization Type Medicare / [employer] (g-code needed)   Authorization - Visit Number 10   Authorization - Number of Visits 20   OT Start Time 1324   OT Stop Time 1530   OT Time Calculation (min) 39 min   Activity Tolerance Patient tolerated treatment well   Behavior During Therapy [facility] for tasks assessed/performed      Past Medical History:  Diagnosis Date  . Acute blood loss anemia   . Adjustment disorder with depressed mood   . CVA (cerebral vascular accident) ([place]) 02/03/2016   Linear infarct within the posterior limb of left internal capsule  . Diastolic CHF ([place])   . Dysphagia   . Dysphonia 02/20/2013  . Essential and other specified forms of tremor 02/20/2013  . HLD (hyperlipidemia)   . HTN (hypertension)   . OAB (overactive bladder)   . Osteoporosis   . Patient receiving subcutaneous heparin    For DVT prophylaxis 9/17  . Prediabetes   . Psoriasis   . Slow transit constipation   . Stroke (cerebrum) ([place]) 02/01/2016  . Trigeminal neuralgia 02/20/2013    Past Surgical History:  Procedure Laterality Date  . APPENDECTOMY  1940  . bladder tack    . CATARACT EXTRACTION Bilateral   . HEMORRHOID SURGERY  1960  . HUMERUS FRACTURE SURGERY    . LAPAROSCOPIC HYSTERECTOMY  2006  . torn rotator cuff    . WRIST FRACTURE SURGERY  2005   seconday shoulder 2001    There were no vitals filed for this visit.      Subjective Assessment - 07/09/16 1452    Subjective  Pt is pleased with progress.  "I liked that"--arm bike    Patient is accompained by: Family member  husband   Pertinent History adjustment disorder with depressed mood, diastolic CHF, osteoporosis, hx of fall, essential tremor, HTN, prediabetes, trigeminal neuralgia   Limitations fall risk   Patient Stated Goals be able to write, fold clothes, do dishes   Currently in Pain? No/denies        In standing, functional reaching to pick up checkers and place in connect 4 slots with reaching across body and forward in mid-range for incr wt. Shift.  Pt with min cueing for elbow extension and for finger ext prior to grasp.  CGA for balance.  Arm bike x44mn level 1 for reciprocal movement without rest and with good positioning.  Practiced writing with min cueing for letter formation/size and use of coban wrapped pen.  Pt wrote name with 100% legibility, #1-20 with good legibility, min difficulty and alphabet with incr time and min difficulty, but good legibility/size.    In standing, shoulder flex AAROM with UE ranger with min facilitation.                        OT Short Term Goals - 07/07/16 1627      OT SHORT TERM GOAL #1   Title Pt will be independent with initial HEP--check STGs 06/30/16   Time  4   Period Weeks   Status Achieved     OT SHORT TERM GOAL #2   Title Pt will be able to write name with at least 75% legibility.   Time 4   Period Weeks   Status Achieved  06/29/16:  met approx this level or higher for name (inconsistent at times)     OT SHORT TERM GOAL #3   Title Pt will demo at least 95* R shoulder flex with min compensation in prep for functional reach.   Time 4   Period Weeks   Status Achieved  06/29/16:  met at approx this level.     OT SHORT TERM GOAL #4   Title Pt will improve functional reaching and grasp/release as shown by improving score on box and blocks by at least 5.   Baseline 3 blocks   Time 4   Period Weeks   Status Achieved  07/02/2016 21 blocks           OT Long Term Goals - 07/09/16  1528      OT LONG TERM GOAL #1   Title Pt will be independent with updated HEP--07/30/16   Time 8   Period Weeks   Status On-going     OT LONG TERM GOAL #2   Title Pt will be able to write name with at least 95% legibility and simple sentence with at least 75% legibility.   Time 8   Period Weeks   Status On-going     OT LONG TERM GOAL #3   Title Pt will demo at least 100* R shoulder flex with min compensation in prep for functional reach.   Period Weeks   Status On-going     OT LONG TERM GOAL #4   Title Pt will improve functional reaching and grasp/release as shown by improving score on box and blocks to at least 25.  Revised 07/09/16:  as pt met initial goal of improving by 10   Baseline 3 blocks   Time 8   Period Weeks   Status On-going  07/09/16  Met, and revised goal     OT LONG TERM GOAL #5   Title Pt will be able to use dominant RUE for grooming tasks at least 75% of the time.   Time 8   Period Weeks   Status On-going     OT LONG TERM GOAL #6   Title Pt will perform simple home maintenance tasks with supervision.   Time 8   Period Weeks   Status On-going               Plan - 07/09/16 1528    Clinical Impression Statement Pt is progressing well and is pleased with progress.  Pt met all STGs and is progressing well toward LTGs, LTG#4 met and revised.  Pt will benefit from continued occupational therapy to improve RUE functional use and improve ADL/IADL performance.   Rehab Potential Good   OT Frequency 2x / week   OT Duration 8 weeks   OT Treatment/Interventions Self-care/ADL training;Therapeutic exercise;DME and/or AE instruction;Therapist, nutritional;Therapeutic activities;Patient/family education;Balance training;Neuromuscular education;Electrical Stimulation;Moist Heat;Energy conservation;Passive range of motion;Therapeutic exercises;Splinting;Manual Therapy;Fluidtherapy;Ultrasound;Cryotherapy;Parrafin   Plan neuro re-ed, balance for IADLs   OT Home  Exercise Plan Education provided:  06/05/15 Initial HEP (table slides with light wt. bearing, tracing, and flipping cards); 06/22/16 supine ball ex and thumb opposition   Consulted and Agree with Plan of Care Patient;Family member/caregiver   Family Member Consulted husband  Patient will benefit from skilled therapeutic intervention in order to improve the following deficits and impairments:  Decreased coordination, Decreased activity tolerance, Decreased knowledge of use of DME, Decreased strength, Impaired UE functional use, Impaired tone, Decreased range of motion, Decreased balance, Decreased mobility, Difficulty walking  Visit Diagnosis: Hemiplegia and hemiparesis following cerebral infarction affecting right dominant side (HCC)  Other symptoms and signs involving the nervous system  Unsteadiness on feet  Other lack of coordination  Muscle weakness (generalized)  Other abnormalities of gait and mobility      G-Codes - 2016/08/03 1731    Functional Assessment Tool Used 9-hole peg test:   unable with RUE (not re-tested).  Box and blocks test:  R-21 blocks   Functional Limitation Carrying, moving and handling objects   Carrying, Moving and Handling Objects Current Status [phone]) At least 40 percent but less than 60 percent impaired, limited or restricted   Carrying, Moving and Handling Objects Goal Status ([id number]) At least 20 percent but less than 40 percent impaired, limited or restricted      Problem List Patient Active Problem List   Diagnosis Date Noted  . Slow transit constipation   . Acute blood loss anemia   . Adjustment disorder with depressed mood   . Cerebrovascular accident (CVA) ([place]) 02/03/2016  . Dysarthria, post-stroke   . Dysphagia, post-stroke   . Leukocytosis   . Prediabetes   . Right hemiparesis ([place])   . Cerebral infarction due to unspecified mechanism   . Other secondary hypertension   . Overactive bladder   . Diastolic dysfunction   .  Hyperlipidemia   . Essential hypertension   . Essential tremor   . Stroke (cerebrum) ([place]) 02/01/2016  . Facial droop due to stroke 02/01/2016  . Essential and other specified forms of tremor 02/20/2013  . Dysphonia 02/20/2013  . Trigeminal neuralgia 02/20/2013    Occupational Therapy Progress Note  Dates of Reporting Period: 06/02/16 to August 03, 2016  Objective Reports of Subjective Statement: see above  Objective Measurements: see above  Goal Update: see above  Plan: see above  Reason Skilled Services are Required: see above     [facility] 03-Aug-2016, 5:32 PM  [facility] [address] [place], [place], [address] Phone: [phone]   Fax:  [phone]  Name: Kelly [name] MRN: [id number] Date of Birth: [birth year]   Vianne Bulls, OTR/L [facility] [address] [place], [place]  [address] [phone] phone [phone] August 03, 2016 5:32 PM

## 2016-07-14 ENCOUNTER — Ambulatory Visit: Payer: Medicare Other | Admitting: Occupational Therapy

## 2016-07-14 ENCOUNTER — Ambulatory Visit: Payer: Medicare Other | Admitting: Physical Therapy

## 2016-07-14 DIAGNOSIS — R2681 Unsteadiness on feet: Secondary | ICD-10-CM | POA: Diagnosis not present

## 2016-07-14 DIAGNOSIS — M6281 Muscle weakness (generalized): Secondary | ICD-10-CM | POA: Diagnosis not present

## 2016-07-14 DIAGNOSIS — R2689 Other abnormalities of gait and mobility: Secondary | ICD-10-CM | POA: Diagnosis not present

## 2016-07-14 DIAGNOSIS — I69351 Hemiplegia and hemiparesis following cerebral infarction affecting right dominant side: Secondary | ICD-10-CM

## 2016-07-14 DIAGNOSIS — R278 Other lack of coordination: Secondary | ICD-10-CM

## 2016-07-14 DIAGNOSIS — R29818 Other symptoms and signs involving the nervous system: Secondary | ICD-10-CM | POA: Diagnosis not present

## 2016-07-14 NOTE — Therapy (Signed)
[facility] [address] [place], [place], [address] Phone: [phone]   Fax:  [phone]  Occupational Therapy Treatment  Patient Details  Name: Kelly [name] MRN: [id number] Date of Birth: 1927/06/28 Referring Provider: Cecille Rubin, NP  Encounter Date: 07/14/2016      OT End of Session - 07/14/16 1409    Visit Number 11   Number of Visits 17   Date for OT Re-Evaluation 07/29/16   Authorization Type Medicare / [employer] (g-code needed)   Authorization - Visit Number 11   Authorization - Number of Visits 20   OT Start Time 1660   OT Stop Time 1445   OT Time Calculation (min) 40 min   Activity Tolerance Patient tolerated treatment well   Behavior During Therapy [facility] for tasks assessed/performed      Past Medical History:  Diagnosis Date  . Acute blood loss anemia   . Adjustment disorder with depressed mood   . CVA (cerebral vascular accident) ([place]) 02/03/2016   Linear infarct within the posterior limb of left internal capsule  . Diastolic CHF ([place])   . Dysphagia   . Dysphonia 02/20/2013  . Essential and other specified forms of tremor 02/20/2013  . HLD (hyperlipidemia)   . HTN (hypertension)   . OAB (overactive bladder)   . Osteoporosis   . Patient receiving subcutaneous heparin    For DVT prophylaxis 9/17  . Prediabetes   . Psoriasis   . Slow transit constipation   . Stroke (cerebrum) ([place]) 02/01/2016  . Trigeminal neuralgia 02/20/2013    Past Surgical History:  Procedure Laterality Date  . APPENDECTOMY  1940  . bladder tack    . CATARACT EXTRACTION Bilateral   . HEMORRHOID SURGERY  1960  . HUMERUS FRACTURE SURGERY    . LAPAROSCOPIC HYSTERECTOMY  2006  . torn rotator cuff    . WRIST FRACTURE SURGERY  2005   seconday shoulder 2001    There were no vitals filed for this visit.      Subjective Assessment - 07/14/16 1408    Subjective  Pt reports that she is able to write without foam on  pen now   Patient is accompained by: Family member  husband   Pertinent History adjustment disorder with depressed mood, diastolic CHF, osteoporosis, hx of fall, essential tremor, HTN, prediabetes, trigeminal neuralgia   Limitations fall risk   Patient Stated Goals be able to write, fold clothes, do dishes   Currently in Pain? No/denies      Arm bike x5 min level 1 for reciprocal movement without rest.  In supine, AAROM BUEs chest press and shoulder flex with ball with min cueing for elbow ext.  Shoulder abduction with cane for RUE AAROM with min facilitation.  Chest press with BUEs with 2lb wt x10 with min cueing.  In sitting, wt. Bearing through R hand on mat in abduction with body on arm movements/LUE functional reaching across body with min-mod cueing, min facilitation.  Then wt. Bearing through R elbow on mat with extending elbow to push to sitting with mod cueing/facilitation initially, then min cues/facilitation.  In standing, functional reaching in low range to grasp large pegs and place in pegboard with min-mod cueing for encouraging in-hand manipulation and coordination.  Then sitting to remove with mod-max difficulty due to fatigue.                            OT Short Term  Goals - 07/07/16 1627      OT SHORT TERM GOAL #1   Title Pt will be independent with initial HEP--check STGs 06/30/16   Time 4   Period Weeks   Status Achieved     OT SHORT TERM GOAL #2   Title Pt will be able to write name with at least 75% legibility.   Time 4   Period Weeks   Status Achieved  06/29/16:  met approx this level or higher for name (inconsistent at times)     OT SHORT TERM GOAL #3   Title Pt will demo at least 95* R shoulder flex with min compensation in prep for functional reach.   Time 4   Period Weeks   Status Achieved  06/29/16:  met at approx this level.     OT SHORT TERM GOAL #4   Title Pt will improve functional reaching and grasp/release as shown by  improving score on box and blocks by at least 5.   Baseline 3 blocks   Time 4   Period Weeks   Status Achieved  07/02/2016 21 blocks           OT Long Term Goals - 07/09/16 1528      OT LONG TERM GOAL #1   Title Pt will be independent with updated HEP--07/30/16   Time 8   Period Weeks   Status On-going     OT LONG TERM GOAL #2   Title Pt will be able to write name with at least 95% legibility and simple sentence with at least 75% legibility.   Time 8   Period Weeks   Status On-going     OT LONG TERM GOAL #3   Title Pt will demo at least 100* R shoulder flex with min compensation in prep for functional reach.   Period Weeks   Status On-going     OT LONG TERM GOAL #4   Title Pt will improve functional reaching and grasp/release as shown by improving score on box and blocks to at least 25.  Revised 07/09/16:  as pt met initial goal of improving by 10   Baseline 3 blocks   Time 8   Period Weeks   Status On-going  07/09/16  Met, and revised goal     OT LONG TERM GOAL #5   Title Pt will be able to use dominant RUE for grooming tasks at least 75% of the time.   Time 8   Period Weeks   Status On-going     OT LONG TERM GOAL #6   Title Pt will perform simple home maintenance tasks with supervision.   Time 8   Period Weeks   Status On-going               Plan - 07/14/16 1429    Clinical Impression Statement Pt continues to progress well and reports improved ability to write without built up grip.     Rehab Potential Good   OT Frequency 2x / week   OT Duration 8 weeks   OT Treatment/Interventions Self-care/ADL training;Therapeutic exercise;DME and/or AE instruction;Therapist, nutritional;Therapeutic activities;Patient/family education;Balance training;Neuromuscular education;Electrical Stimulation;Moist Heat;Energy conservation;Passive range of motion;Therapeutic exercises;Splinting;Manual Therapy;Fluidtherapy;Ultrasound;Cryotherapy;Parrafin   Plan neuro re-ed,  balance for IADLs, functional reaching.   OT Home Exercise Plan Education provided:  06/05/15 Initial HEP (table slides with light wt. bearing, tracing, and flipping cards); 06/22/16 supine ball ex and thumb opposition   Consulted and Agree with Plan of Care Patient;Family member/caregiver  Family Member Consulted husband      Patient will benefit from skilled therapeutic intervention in order to improve the following deficits and impairments:  Decreased coordination, Decreased activity tolerance, Decreased knowledge of use of DME, Decreased strength, Impaired UE functional use, Impaired tone, Decreased range of motion, Decreased balance, Decreased mobility, Difficulty walking  Visit Diagnosis: Hemiplegia and hemiparesis following cerebral infarction affecting right dominant side (HCC)  Other symptoms and signs involving the nervous system  Unsteadiness on feet  Other lack of coordination  Muscle weakness (generalized)  Other abnormalities of gait and mobility    Problem List Patient Active Problem List   Diagnosis Date Noted  . Slow transit constipation   . Acute blood loss anemia   . Adjustment disorder with depressed mood   . Cerebrovascular accident (CVA) ([place]) 02/03/2016  . Dysarthria, post-stroke   . Dysphagia, post-stroke   . Leukocytosis   . Prediabetes   . Right hemiparesis ([place])   . Cerebral infarction due to unspecified mechanism   . Other secondary hypertension   . Overactive bladder   . Diastolic dysfunction   . Hyperlipidemia   . Essential hypertension   . Essential tremor   . Stroke (cerebrum) ([place]) 02/01/2016  . Facial droop due to stroke 02/01/2016  . Essential and other specified forms of tremor 02/20/2013  . Dysphonia 02/20/2013  . Trigeminal neuralgia 02/20/2013    [facility] 07/14/2016, 2:30 PM  [facility] [address] [place], [place], [address] Phone: [phone]   Fax:   [phone]  Name: LUNDYNN COHOON MRN: [id number] Date of Birth: [birth year]   Vianne Bulls, OTR/L [facility] [address] [place], [place]  [address] [phone] phone [phone] 07/14/16 2:30 PM

## 2016-07-15 NOTE — Therapy (Signed)
[facility] [address] [place], [place], [address] Phone: [phone]   Fax:  [phone]  Physical Therapy Treatment  Patient Details  Name: Kelly [name] MRN: [id number] Date of Birth: August 23, 1927 Referring Provider: Cecille Rubin  Encounter Date: 07/14/2016      PT End of Session - 07/15/16 1947    Visit Number 11   Number of Visits 18   Date for PT Re-Evaluation 07/31/16   Authorization Type Medicare/[employer] every 10th visit   PT Start Time 1317   PT Stop Time 1400   PT Time Calculation (min) 43 min   Equipment Utilized During Treatment Gait belt   Activity Tolerance Patient tolerated treatment well   Behavior During Therapy [facility] for tasks assessed/performed      Past Medical History:  Diagnosis Date  . Acute blood loss anemia   . Adjustment disorder with depressed mood   . CVA (cerebral vascular accident) ([place]) 02/03/2016   Linear infarct within the posterior limb of left internal capsule  . Diastolic CHF ([place])   . Dysphagia   . Dysphonia 02/20/2013  . Essential and other specified forms of tremor 02/20/2013  . HLD (hyperlipidemia)   . HTN (hypertension)   . OAB (overactive bladder)   . Osteoporosis   . Patient receiving subcutaneous heparin    For DVT prophylaxis 9/17  . Prediabetes   . Psoriasis   . Slow transit constipation   . Stroke (cerebrum) ([place]) 02/01/2016  . Trigeminal neuralgia 02/20/2013    Past Surgical History:  Procedure Laterality Date  . APPENDECTOMY  1940  . bladder tack    . CATARACT EXTRACTION Bilateral   . HEMORRHOID SURGERY  1960  . HUMERUS FRACTURE SURGERY    . LAPAROSCOPIC HYSTERECTOMY  2006  . torn rotator cuff    . WRIST FRACTURE SURGERY  2005   seconday shoulder 2001    There were no vitals filed for this visit.      Subjective Assessment - 07/14/16 1323    Subjective No changes since last visit.  Feels she is using R hand more since Botox.  No  falls.   Patient is accompained by: Family member   Patient Stated Goals Pt's goal for therapy is to walk again.   Currently in Pain? No/denies                         [facility] Adult PT Treatment/Exercise - 07/15/16 0001      Transfers   Transfers Sit to Stand;Stand to Sit   Sit to Stand 5: Supervision   Sit to Stand Details (indicate cue type and reason) Subtle cues for proper hand placement with sit<>stand at RW  multipe reps throughout session   Stand to Sit 5: Supervision     Ambulation/Gait   Ambulation/Gait Yes   Ambulation/Gait Assistance 5: Supervision   Ambulation/Gait Assistance Details PT provides manual/tactile cues at hips for weigthshifting and to increase R stance time for improve LLE step.  Discussed with pt and husband more consistent, step through, even step length, especially with curves and turns, needing to widen BOS.  Practiced figure-8 around cones, x 2 reps with RW, with cues for increased step length, consistent step through pattern.   Ambulation Distance (Feet) 110 Feet  then 230 ft x 2; then 20 ft x 2, 40 ft x 2   Assistive device Rolling walker   Gait Pattern Step-through pattern;Decreased step length - right;Decreased stance  time - right;Decreased step length - left;Trunk flexed;Narrow base of support;Right genu recurvatum;Poor foot clearance - left;Poor foot clearance - right   Ambulation Surface Level;Indoor   Stairs Yes   Stairs Assistance 4: Min guard   Stair Management Technique One rail Right;Alternating pattern;Forwards   Number of Stairs 4   Height of Stairs 6   Pre-Gait Activities Weight-shifting anterior/posterior with stagger stance position, focusing on hip/knee extension on front leg, and increased dorsiflexion with posterior weigthshift, 10 reps each position in parallel bars.   Gait Comments Gait training with cues for widened BOS, with tactile cues for lateral weightshifting for increased stance time on RLE to allow for improved  step length with LLE.     High Level Balance   High Level Balance Activities Side stepping;Backward walking   High Level Balance Comments in parallel bars with UE support, cues for increased (equal) step length and for improved foot clearance  8 ft x 4 reps each             Balance Exercises - 07/15/16 1933      Balance Exercises: Standing   SLS Eyes open;Solid surface  Alternating step taps to 6" step x 10 reps    Other Standing Exercises Standing, terminal knee extension, 3 stance phases, with red theraband, RLE x 10 reps each leg, with verbal/tactile cues to prevent recurvatum in R knee             PT Short Term Goals - 06/29/16 1453      PT SHORT TERM GOAL #1   Title Pt will perform HEP with family's supervision, to address balance, gait, transfers and strengthening.  TARGET 07/01/16   Baseline 06/29/16: met per pt and spouse report   Time --   Period --   Status Achieved     PT SHORT TERM GOAL #2   Title Pt will improve Berg Balance score to at least 26/56 for decreased fall risk.   Baseline 06/29/16: 30/56 scored today   Time --   Period --   Status Achieved     PT SHORT TERM GOAL #3   Title Pt will improve TUG score to less than or equal to 50 seconds for decreased fall risk.   Baseline 06/29/16: 23.81 with RW   Time --   Period --   Status Achieved     PT SHORT TERM GOAL #4   Title Pt will transfer sit<>stand, modified independently, at least 4 of 5 trials, to demo improved lower extremity strength and transfer efficiency.   Baseline 06/29/16: pt at supervision level with cues for safety/sequencing needed at times   Time --   Period --   Status Partially Met     PT SHORT TERM GOAL #5   Title Pt will ambulate at least 200 ft using RW, supervision, for improved gait safety and efficiency.   Baseline 06/29/16: met today with RW   Time --   Period --   Status Achieved     PT SHORT TERM GOAL #6   Title Pt/family will verbalize understanding of CVA education.    Time 4   Period Weeks   Status On-going           PT Long Term Goals - 06/24/16 1926      PT LONG TERM GOAL #1   Title Pt/family will verbalize understanding of fall prevention in the home environment.  TARGET 07/31/16   Time 8   Period Weeks   Status On-going  PT LONG TERM GOAL #2   Title Pt will improve Berg Balance score to at least 31/56 for decreased fall risk.   Time 8   Period Weeks   Status On-going     PT LONG TERM GOAL #3   Title Pt will improve TUG score to less than or equal to 45 seconds for decreased fall risk.   Time 8   Period Weeks   Status On-going     PT LONG TERM GOAL #4   Title Pt will improve gait velocity to at least 1.8 ft/sec for improved gait efficiency and safety.   Time 8   Period Weeks   Status On-going     PT LONG TERM GOAL #5   Title Pt will ambulate at least 800 ft indoor and outdoor surfaces, using RW, modified independently, for improved outdoor and community.   Time 8   Period Weeks   Status On-going     PT LONG TERM GOAL #6   Title Pt will improve Neuro QOL score on FOTO by at least 20% for improved functional mobility.   Time 8   Period Weeks   Status On-going               Plan - 07/15/16 1947    Clinical Impression Statement Pt able to demo improved gait pattern with focused attention and cueing for widened BOS, increased (subtle) lateral weightshifting for increased RLE stance time to allow for improved LLE step length,and more consistent step-through pattern.  Pt seems to have increased incidence of R foot catches with curves and turns.  Pt will continue to benefit from skilled PT to address strength, balance, and gait training.   Rehab Potential Good   PT Frequency 2x / week   PT Duration 6 weeks   PT Treatment/Interventions ADLs/Self Care Home Management;Functional mobility training;Stair training;Gait training;Patient/family education;Neuromuscular re-education;Balance training;Therapeutic  exercise;Therapeutic activities;DME Instruction   PT Next Visit Plan Continue gait training with R knee control and R foot clearance during gait; add R ankle DF strengthening to HEP.     PT Home Exercise Plan son Zenia [employer], PT) gave standing ex's at counter; pelvic tilts, bridging, standing bil ankle DF stretch w/ 2" block, standing knee flexion, hip abdct; seated knee flexion with tband, backward walking   Consulted and Agree with Plan of Care Patient;Family member/caregiver   Family Member Consulted Husband      Patient will benefit from skilled therapeutic intervention in order to improve the following deficits and impairments:  Abnormal gait, Decreased balance, Decreased cognition, Decreased knowledge of use of DME, Decreased range of motion, Decreased safety awareness, Decreased strength  Visit Diagnosis: Other abnormalities of gait and mobility  Unsteadiness on feet     Problem List Patient Active Problem List   Diagnosis Date Noted  . Slow transit constipation   . Acute blood loss anemia   . Adjustment disorder with depressed mood   . Cerebrovascular accident (CVA) ([place]) 02/03/2016  . Dysarthria, post-stroke   . Dysphagia, post-stroke   . Leukocytosis   . Prediabetes   . Right hemiparesis ([place])   . Cerebral infarction due to unspecified mechanism   . Other secondary hypertension   . Overactive bladder   . Diastolic dysfunction   . Hyperlipidemia   . Essential hypertension   . Essential tremor   . Stroke (cerebrum) ([place]) 02/01/2016  . Facial droop due to stroke 02/01/2016  . Essential and other specified forms of tremor 02/20/2013  . Dysphonia 02/20/2013  .  Trigeminal neuralgia 02/20/2013    Karlissa Aron W. 07/15/2016, 7:51 PM  Frazier Butt., PT  [facility] [address] [place], [place], [address] Phone: [phone]   Fax:  [phone]  Name: ZERA MARKWARDT MRN: [id number] Date of Birth:  [birth year]

## 2016-07-16 ENCOUNTER — Ambulatory Visit: Payer: Medicare Other | Admitting: Occupational Therapy

## 2016-07-16 ENCOUNTER — Ambulatory Visit: Payer: Medicare Other | Admitting: Physical Therapy

## 2016-07-16 ENCOUNTER — Encounter: Payer: Self-pay | Admitting: Physical Therapy

## 2016-07-16 DIAGNOSIS — R278 Other lack of coordination: Secondary | ICD-10-CM

## 2016-07-16 DIAGNOSIS — I69351 Hemiplegia and hemiparesis following cerebral infarction affecting right dominant side: Secondary | ICD-10-CM

## 2016-07-16 DIAGNOSIS — M6281 Muscle weakness (generalized): Secondary | ICD-10-CM | POA: Diagnosis not present

## 2016-07-16 DIAGNOSIS — R2689 Other abnormalities of gait and mobility: Secondary | ICD-10-CM

## 2016-07-16 DIAGNOSIS — R2681 Unsteadiness on feet: Secondary | ICD-10-CM

## 2016-07-16 DIAGNOSIS — R29818 Other symptoms and signs involving the nervous system: Secondary | ICD-10-CM

## 2016-07-16 NOTE — Therapy (Signed)
[facility] [address] [place], [place], [address] Phone: [phone]   Fax:  [phone]  Occupational Therapy Treatment  Patient Details  Name: Kelly [name] MRN: [id number] Date of Birth: 08/17/1927 Referring Provider: Cecille Rubin, NP  Encounter Date: 07/16/2016      OT End of Session - 07/16/16 1558    Visit Number 12   Number of Visits 17   Date for OT Re-Evaluation 07/29/16   Authorization Type Medicare / [employer] (g-code needed)   Authorization - Visit Number 12   Authorization - Number of Visits 20   OT Start Time 1103   OT Stop Time 1448   OT Time Calculation (min) 43 min   Activity Tolerance Patient tolerated treatment well   Behavior During Therapy [facility] for tasks assessed/performed      Past Medical History:  Diagnosis Date  . Acute blood loss anemia   . Adjustment disorder with depressed mood   . CVA (cerebral vascular accident) ([place]) 02/03/2016   Linear infarct within the posterior limb of left internal capsule  . Diastolic CHF ([place])   . Dysphagia   . Dysphonia 02/20/2013  . Essential and other specified forms of tremor 02/20/2013  . HLD (hyperlipidemia)   . HTN (hypertension)   . OAB (overactive bladder)   . Osteoporosis   . Patient receiving subcutaneous heparin    For DVT prophylaxis 9/17  . Prediabetes   . Psoriasis   . Slow transit constipation   . Stroke (cerebrum) ([place]) 02/01/2016  . Trigeminal neuralgia 02/20/2013    Past Surgical History:  Procedure Laterality Date  . APPENDECTOMY  1940  . bladder tack    . CATARACT EXTRACTION Bilateral   . HEMORRHOID SURGERY  1960  . HUMERUS FRACTURE SURGERY    . LAPAROSCOPIC HYSTERECTOMY  2006  . torn rotator cuff    . WRIST FRACTURE SURGERY  2005   seconday shoulder 2001    There were no vitals filed for this visit.      Subjective Assessment - 07/16/16 1618    Subjective  Pt reports that she is using RUE more    Patient is  accompained by: Family member  husband   Pertinent History adjustment disorder with depressed mood, diastolic CHF, osteoporosis, hx of fall, essential tremor, HTN, prediabetes, trigeminal neuralgia   Limitations fall risk   Patient Stated Goals be able to write, fold clothes, do dishes   Currently in Pain? No/denies      In sitting, wt. Bearing through R hand on mat in abduction with body on arm movements/LUE functional reaching across body with min cueing, min facilitation.  Then wt. Bearing through R elbow on mat with extending elbow to push to sitting with min cues/facilitation.  In sitting, functional reaching in low range to cylinder objects with min cueing for elbow ext and intermittent min A to stabilize objects for grasp.    Standing at sink to rinse off/wash dishes and then simulated placing in dishwasher with no LOB, min cueing for RW placement.  Then holding onto counter when reaching to retrieve items out of bottom drawer to simulate loading bottom rack of dishwasher with no LOB.  Supervision provided.  Encouraged pt to rinse off dishes at home.  In sitting at table, flipping large cards with focus on thumb palmar abduction/opposition and supination with mod cueing.  Thumb palmar abduction over top of cylinder object to assist with opposition with min facilitation/cueing to avoid compensation patterns.  OT Short Term Goals - 07/07/16 1627      OT SHORT TERM GOAL #1   Title Pt will be independent with initial HEP--check STGs 06/30/16   Time 4   Period Weeks   Status Achieved     OT SHORT TERM GOAL #2   Title Pt will be able to write name with at least 75% legibility.   Time 4   Period Weeks   Status Achieved  06/29/16:  met approx this level or higher for name (inconsistent at times)     OT SHORT TERM GOAL #3   Title Pt will demo at least 95* R shoulder flex with min compensation in prep for functional reach.   Time 4   Period Weeks    Status Achieved  06/29/16:  met at approx this level.     OT SHORT TERM GOAL #4   Title Pt will improve functional reaching and grasp/release as shown by improving score on box and blocks by at least 5.   Baseline 3 blocks   Time 4   Period Weeks   Status Achieved  07/02/2016 21 blocks           OT Long Term Goals - 07/09/16 1528      OT LONG TERM GOAL #1   Title Pt will be independent with updated HEP--07/30/16   Time 8   Period Weeks   Status On-going     OT LONG TERM GOAL #2   Title Pt will be able to write name with at least 95% legibility and simple sentence with at least 75% legibility.   Time 8   Period Weeks   Status On-going     OT LONG TERM GOAL #3   Title Pt will demo at least 100* R shoulder flex with min compensation in prep for functional reach.   Period Weeks   Status On-going     OT LONG TERM GOAL #4   Title Pt will improve functional reaching and grasp/release as shown by improving score on box and blocks to at least 25.  Revised 07/09/16:  as pt met initial goal of improving by 10   Baseline 3 blocks   Time 8   Period Weeks   Status On-going  07/09/16  Met, and revised goal     OT LONG TERM GOAL #5   Title Pt will be able to use dominant RUE for grooming tasks at least 75% of the time.   Time 8   Period Weeks   Status On-going     OT LONG TERM GOAL #6   Title Pt will perform simple home maintenance tasks with supervision.   Time 8   Period Weeks   Status On-going               Plan - 07/16/16 1606    Clinical Impression Statement Pt continues to make progress with incr RUE functional use.  Decr thumb opposition affects ability to grasp objects.   Rehab Potential Good   OT Frequency 2x / week   OT Duration 8 weeks   OT Treatment/Interventions Self-care/ADL training;Therapeutic exercise;DME and/or AE instruction;Therapist, nutritional;Therapeutic activities;Patient/family education;Balance training;Neuromuscular education;Electrical  Stimulation;Moist Heat;Energy conservation;Passive range of motion;Therapeutic exercises;Splinting;Manual Therapy;Fluidtherapy;Ultrasound;Cryotherapy;Parrafin   Plan neuro re-ed, functional reaching, coordination   OT Home Exercise Plan Education provided:  06/05/15 Initial HEP (table slides with light wt. bearing, tracing, and flipping cards); 06/22/16 supine ball ex and thumb opposition   Consulted and Agree with Plan of Care Patient;Family  member/caregiver   Family Member Consulted husband      Patient will benefit from skilled therapeutic intervention in order to improve the following deficits and impairments:  Decreased coordination, Decreased activity tolerance, Decreased knowledge of use of DME, Decreased strength, Impaired UE functional use, Impaired tone, Decreased range of motion, Decreased balance, Decreased mobility, Difficulty walking  Visit Diagnosis: Hemiplegia and hemiparesis following cerebral infarction affecting right dominant side (HCC)  Other symptoms and signs involving the nervous system  Unsteadiness on feet  Other lack of coordination  Muscle weakness (generalized)  Other abnormalities of gait and mobility    Problem List Patient Active Problem List   Diagnosis Date Noted  . Slow transit constipation   . Acute blood loss anemia   . Adjustment disorder with depressed mood   . Cerebrovascular accident (CVA) ([place]) 02/03/2016  . Dysarthria, post-stroke   . Dysphagia, post-stroke   . Leukocytosis   . Prediabetes   . Right hemiparesis ([place])   . Cerebral infarction due to unspecified mechanism   . Other secondary hypertension   . Overactive bladder   . Diastolic dysfunction   . Hyperlipidemia   . Essential hypertension   . Essential tremor   . Stroke (cerebrum) ([place]) 02/01/2016  . Facial droop due to stroke 02/01/2016  . Essential and other specified forms of tremor 02/20/2013  . Dysphonia 02/20/2013  . Trigeminal neuralgia 02/20/2013     [facility] 07/16/2016, 4:19 PM  [facility] [address] [place], [place], [address] Phone: [phone]   Fax:  [phone]  Name: FRADY TADDEO MRN: [id number] Date of Birth: 12-07-1927   Vianne Bulls, OTR/L [facility] [address] [place],   [address] [phone] phone [phone] 07/16/16 4:19 PM

## 2016-07-16 NOTE — Addendum Note (Signed)
Addended by: Gean MaidensMARRIOTT, [place]one[place] Bojanowski W on: 07/16/2016 02:25 PM   Modules accepted: Orders

## 2016-07-16 NOTE — Therapy (Addendum)
[facility] [address] [place], [place], [address] Phone: [phone]   Fax:  [phone]  Physical Therapy Treatment  Patient Details  Name: Kelly [name] MRN: [id number] Date of Birth: Jun 08, 1927 Referring Provider: Darrol Angel  Encounter Date: 07/16/2016      PT End of Session - 07/16/16 1630    Visit Number 12   Number of Visits 18   Date for PT Re-Evaluation 07/31/16   Authorization Type Medicare/[employer]-[place] every 10th visit   PT Start Time 1450   PT Stop Time 1530   PT Time Calculation (min) 40 min   Equipment Utilized During Treatment Gait belt   Activity Tolerance Patient tolerated treatment well   Behavior During Therapy [facility] for tasks assessed/performed      Past Medical History:  Diagnosis Date  . Acute blood loss anemia   . Adjustment disorder with depressed mood   . CVA (cerebral vascular accident) (HCC) 02/03/2016   Linear infarct within the posterior limb of left internal capsule  . Diastolic CHF (HCC)   . Dysphagia   . Dysphonia 02/20/2013  . Essential and other specified forms of tremor 02/20/2013  . HLD (hyperlipidemia)   . HTN (hypertension)   . OAB (overactive bladder)   . Osteoporosis   . Patient receiving subcutaneous heparin    For DVT prophylaxis 9/17  . Prediabetes   . Psoriasis   . Slow transit constipation   . Stroke (cerebrum) (HCC) 02/01/2016  . Trigeminal neuralgia 02/20/2013    Past Surgical History:  Procedure Laterality Date  . APPENDECTOMY  1940  . bladder tack    . CATARACT EXTRACTION Bilateral   . HEMORRHOID SURGERY  1960  . HUMERUS FRACTURE SURGERY    . LAPAROSCOPIC HYSTERECTOMY  2006  . torn rotator cuff    . WRIST FRACTURE SURGERY  2005   seconday shoulder 2001    There were no vitals filed for this visit.      Subjective Assessment - 07/16/16 1452    Subjective No changes since last visit. No falls.   Patient is accompained by: Family  member  husband   Patient Stated Goals Pt's goal for therapy is to walk again.   Currently in Pain? No/denies                         [facility] Adult PT Treatment/Exercise - 07/16/16 1552      Transfers   Transfers Sit to Stand;Stand to Sit   Sit to Stand 6: Modified independent (Device/Increase time)   Sit to Stand Details (indicate cue type and reason) no cues needed with safe technique   Stand to Sit 5: Supervision   Stand to Sit Details multiple transfers during session; only once required cues to fully turn and bring RW around prior to sitting     Ambulation/Gait   Ambulation/Gait Yes   Ambulation/Gait Assistance 4: Min guard;5: Supervision   Ambulation/Gait Assistance Details within first 10 ft pt had rt foot catch with LOB; utilized shoe cover over toe of Rt shoe (to simulate leather toe patch) for remainder of session with no further catching of Rt foot. utilized 1/4" and then 1/2" heel lift to attempt to decr Rt knee genu recurvatum with force of hyperextension reduced, but still occurred.   Ambulation Distance (Feet) 40 Feet  70, 110, 90, 70, 70   Assistive device Rolling walker   Gait Pattern Step-through pattern;Decreased step length - right;Decreased stance  time - right;Decreased step length - left;Trunk flexed;Narrow base of support;Right genu recurvatum;Poor foot clearance - left;Poor foot clearance - right;Lateral hip instability;Lateral trunk lean to left   Ambulation Surface Level;Indoor   Gait Comments vc for heel strike, "standing tall" over RLE while advancing LLE, upright posture     Ankle Exercises: Seated   Toe Raise 20 reps  2 sets; 1#, then 2# around Rt forefoot             Balance Exercises - 07/16/16 1627      Balance Exercises: Standing   Rockerboard Anterior/posterior;Lateral;EO;30 seconds;Intermittent UE support  able to maintain neutral x 30 sec; provided perturbations            PT Education - 07/16/16 1629    Education  provided Yes   Education Details Reviewed resisted Rt dorsiflexion with theraband (ex. given previously, however husband and pt had substituted heel cord stretch and thought they were to stop DF exercise. Educated on the difference between the exercises. Educated on purpose of leather toe cap for shoe. Husband feels he can take her shoe to shoe repair place and have it done cheaper than $40-60 dollars at orthotist office.    Person(s) Educated Patient;Spouse   Methods Explanation;Demonstration;Verbal cues   Comprehension Verbalized understanding;Returned demonstration;Verbal cues required;Need further instruction          PT Short Term Goals - 06/29/16 1453      PT SHORT TERM GOAL #1   Title Pt will perform HEP with family's supervision, to address balance, gait, transfers and strengthening.  TARGET 07/01/16   Baseline 06/29/16: met per pt and spouse report   Time --   Period --   Status Achieved     PT SHORT TERM GOAL #2   Title Pt will improve Berg Balance score to at least 26/56 for decreased fall risk.   Baseline 06/29/16: 30/56 scored today   Time --   Period --   Status Achieved     PT SHORT TERM GOAL #3   Title Pt will improve TUG score to less than or equal to 50 seconds for decreased fall risk.   Baseline 06/29/16: 23.81 with RW   Time --   Period --   Status Achieved     PT SHORT TERM GOAL #4   Title Pt will transfer sit<>stand, modified independently, at least 4 of 5 trials, to demo improved lower extremity strength and transfer efficiency.   Baseline 06/29/16: pt at supervision level with cues for safety/sequencing needed at times   Time --   Period --   Status Partially Met     PT SHORT TERM GOAL #5   Title Pt will ambulate at least 200 ft using RW, supervision, for improved gait safety and efficiency.   Baseline 06/29/16: met today with RW   Time --   Period --   Status Achieved     PT SHORT TERM GOAL #6   Title Pt/family will verbalize understanding of CVA  education.   Time 4   Period Weeks   Status On-going           PT Long Term Goals - 06/24/16 1926      PT LONG TERM GOAL #1   Title Pt/family will verbalize understanding of fall prevention in the home environment.  TARGET 07/31/16   Time 8   Period Weeks   Status On-going     PT LONG TERM GOAL #2   Title Pt will improve Programmer, systems  score to at least 31/56 for decreased fall risk.   Time 8   Period Weeks   Status On-going     PT LONG TERM GOAL #3   Title Pt will improve TUG score to less than or equal to 45 seconds for decreased fall risk.   Time 8   Period Weeks   Status On-going     PT LONG TERM GOAL #4   Title Pt will improve gait velocity to at least 1.8 ft/sec for improved gait efficiency and safety.   Time 8   Period Weeks   Status On-going     PT LONG TERM GOAL #5   Title Pt will ambulate at least 800 ft indoor and outdoor surfaces, using RW, modified independently, for improved outdoor and community.   Time 8   Period Weeks   Status On-going     PT LONG TERM GOAL #6   Title Pt will improve Neuro QOL score on FOTO by at least 20% for improved functional mobility.   Time 8   Period Weeks   Status On-going               Plan - 07/16/16 1631    Clinical Impression Statement Focus of session on gait training and trials of external aids/shoe modifications to prevent Rt foot/toe catching during swing and knee hyperextension during stance. +results with "leather toe cap" and no further catching/tripping throughout session. Heel lift inside shoe reduced intensity of knee hyperextension, however was still present. Continued gait training.    Rehab Potential Good   PT Frequency 2x / week   PT Duration 6 weeks   PT Treatment/Interventions ADLs/Self Care Home Management;Functional mobility training;Stair training;Gait training;Patient/family education;Neuromuscular re-education;Balance training;Therapeutic exercise;Therapeutic activities;DME Instruction   PT  Next Visit Plan Discuss renewal x 4 weeks and schedule more appts; See if she had any luck with different shoes (told to try less gripping sole and even small heel) vs if they looked into leather toe cap. Continue gait training with R knee control and R foot clearance during gait; work on stepping recovery if LOB     PT Home Exercise Plan son Freida Busman Disney, PT) gave standing ex's at counter; pelvic tilts, bridging, standing bil ankle DF stretch w/ 2" block, standing knee flexion, hip abdct; seated knee flexion with tband, backward walking   Consulted and Agree with Plan of Care Patient;Family member/caregiver   Family Member Consulted Husband      Patient will benefit from skilled therapeutic intervention in order to improve the following deficits and impairments:  Abnormal gait, Decreased balance, Decreased cognition, Decreased knowledge of use of DME, Decreased range of motion, Decreased safety awareness, Decreased strength  Visit Diagnosis: Unsteadiness on feet  Muscle weakness (generalized)  Other abnormalities of gait and mobility    Problem List Patient Active Problem List   Diagnosis Date Noted  . Slow transit constipation   . Acute blood loss anemia   . Adjustment disorder with depressed mood   . Cerebrovascular accident (CVA) (HCC) 02/03/2016  . Dysarthria, post-stroke   . Dysphagia, post-stroke   . Leukocytosis   . Prediabetes   . Right hemiparesis (HCC)   . Cerebral infarction due to unspecified mechanism   . Other secondary hypertension   . Overactive bladder   . Diastolic dysfunction   . Hyperlipidemia   . Essential hypertension   . Essential tremor   . Stroke (cerebrum) (HCC) 02/01/2016  . Facial droop due to stroke 02/01/2016  . Essential and  other specified forms of tremor 02/20/2013  . Dysphonia 02/20/2013  . Trigeminal neuralgia 02/20/2013       Rexanne Mano, PT 07/16/2016, 4:45 PM  [facility] [address] [place], [place], [address] Phone: [phone]   Fax:  [phone]  Name: KATLEEN CARRAWAY MRN: [id number] Date of Birth: Apr 24, 1928

## 2016-07-21 ENCOUNTER — Ambulatory Visit: Payer: Medicare Other | Admitting: Physical Therapy

## 2016-07-21 ENCOUNTER — Ambulatory Visit: Payer: Medicare Other | Admitting: Occupational Therapy

## 2016-07-21 ENCOUNTER — Encounter: Payer: Self-pay | Admitting: Physical Therapy

## 2016-07-21 DIAGNOSIS — R29818 Other symptoms and signs involving the nervous system: Secondary | ICD-10-CM

## 2016-07-21 DIAGNOSIS — M6281 Muscle weakness (generalized): Secondary | ICD-10-CM

## 2016-07-21 DIAGNOSIS — I69351 Hemiplegia and hemiparesis following cerebral infarction affecting right dominant side: Secondary | ICD-10-CM

## 2016-07-21 DIAGNOSIS — R2681 Unsteadiness on feet: Secondary | ICD-10-CM | POA: Diagnosis not present

## 2016-07-21 DIAGNOSIS — R2689 Other abnormalities of gait and mobility: Secondary | ICD-10-CM

## 2016-07-21 DIAGNOSIS — R278 Other lack of coordination: Secondary | ICD-10-CM

## 2016-07-21 NOTE — Therapy (Signed)
[facility] [address] [place], [place], [address] Phone: [phone]   Fax:  [phone]  Occupational Therapy Treatment  Patient Details  Name: Kelly [name] MRN: [id number] Date of Birth: 1927/09/25 Referring Provider: Cecille Rubin, NP  Encounter Date: 07/21/2016      OT End of Session - 07/21/16 1919    Visit Number 13   Number of Visits 17   Date for OT Re-Evaluation 07/29/16   Authorization Type Medicare / [employer] (g-code needed)   Authorization - Visit Number 13   Authorization - Number of Visits 20   OT Start Time 0932   OT Stop Time 1445   OT Time Calculation (min) 40 min   Activity Tolerance Patient tolerated treatment well   Behavior During Therapy [facility] for tasks assessed/performed      Past Medical History:  Diagnosis Date  . Acute blood loss anemia   . Adjustment disorder with depressed mood   . CVA (cerebral vascular accident) ([place]) 02/03/2016   Linear infarct within the posterior limb of left internal capsule  . Diastolic CHF ([place])   . Dysphagia   . Dysphonia 02/20/2013  . Essential and other specified forms of tremor 02/20/2013  . HLD (hyperlipidemia)   . HTN (hypertension)   . OAB (overactive bladder)   . Osteoporosis   . Patient receiving subcutaneous heparin    For DVT prophylaxis 9/17  . Prediabetes   . Psoriasis   . Slow transit constipation   . Stroke (cerebrum) ([place]) 02/01/2016  . Trigeminal neuralgia 02/20/2013    Past Surgical History:  Procedure Laterality Date  . APPENDECTOMY  1940  . bladder tack    . CATARACT EXTRACTION Bilateral   . HEMORRHOID SURGERY  1960  . HUMERUS FRACTURE SURGERY    . LAPAROSCOPIC HYSTERECTOMY  2006  . torn rotator cuff    . WRIST FRACTURE SURGERY  2005   seconday shoulder 2001    There were no vitals filed for this visit.      Subjective Assessment - 07/21/16 1410    Subjective  Fall before she left for therapy in bathroom, scraped  right side but no other injuries (bathroom was in different arrangement due to awaiting floor repair).   Patient is accompained by: Family member  husband   Pertinent History adjustment disorder with depressed mood, diastolic CHF, osteoporosis, hx of fall, essential tremor, HTN, prediabetes, trigeminal neuralgia   Limitations fall risk   Patient Stated Goals be able to write, fold clothes, do dishes   Currently in Pain? Yes   Pain Score 8    Pain Location --  R side   Pain Orientation Right   Pain Descriptors / Indicators Aching   Pain Type Acute pain   Pain Onset Today   Aggravating Factors  scraped on R side, movement   Pain Relieving Factors rest       Discussed progress and continuing OT.  Pt/husband desire to continue OT and are pleased with current progress.  Pt would like to participate in pool exercises after therapy d/c.  In sitting, table slides for shoulder flex, horizontal abduction, and elbow ext stretch with min cueing.    Flipping large cards with focus on full finger extension and supination.  Pt with mod difficulty/cues with supination/finger ext, but improved with repetition.  In standing, wt. Bearing though both hands on table with forward/backward wt. Shifts for decr tone/neuro re-ed with min cueing.  In sitting, functional reaching at mid-range to  grasp cylinder objects and release using forward and lateral reach/ER.  Pt needed min cueing, but demo improved grasp today and ability to open hand wider.  Pt continues to fatigue after approx 15 reps, and needed min facilitation/support at forearm/elbow with fatigue to avoid compensation.                         OT Education - 07/21/16 1917    Education Details Grasp/release activities added to HEP--see pt instructions   Person(s) Educated Patient;Spouse   Methods Explanation;Demonstration;Verbal cues;Handout   Comprehension Returned demonstration;Verbalized understanding;Verbal cues required   min cueing          OT Short Term Goals - 07/07/16 1627      OT SHORT TERM GOAL #1   Title Pt will be independent with initial HEP--check STGs 06/30/16   Time 4   Period Weeks   Status Achieved     OT SHORT TERM GOAL #2   Title Pt will be able to write name with at least 75% legibility.   Time 4   Period Weeks   Status Achieved  06/29/16:  met approx this level or higher for name (inconsistent at times)     OT SHORT TERM GOAL #3   Title Pt will demo at least 95* R shoulder flex with min compensation in prep for functional reach.   Time 4   Period Weeks   Status Achieved  06/29/16:  met at approx this level.     OT SHORT TERM GOAL #4   Title Pt will improve functional reaching and grasp/release as shown by improving score on box and blocks by at least 5.   Baseline 3 blocks   Time 4   Period Weeks   Status Achieved  07/02/2016 21 blocks           OT Long Term Goals - 07/09/16 1528      OT LONG TERM GOAL #1   Title Pt will be independent with updated HEP--07/30/16   Time 8   Period Weeks   Status On-going     OT LONG TERM GOAL #2   Title Pt will be able to write name with at least 95% legibility and simple sentence with at least 75% legibility.   Time 8   Period Weeks   Status On-going     OT LONG TERM GOAL #3   Title Pt will demo at least 100* R shoulder flex with min compensation in prep for functional reach.   Period Weeks   Status On-going     OT LONG TERM GOAL #4   Title Pt will improve functional reaching and grasp/release as shown by improving score on box and blocks to at least 25.  Revised 07/09/16:  as pt met initial goal of improving by 10   Baseline 3 blocks   Time 8   Period Weeks   Status On-going  07/09/16  Met, and revised goal     OT LONG TERM GOAL #5   Title Pt will be able to use dominant RUE for grooming tasks at least 75% of the time.   Time 8   Period Weeks   Status On-going     OT LONG TERM GOAL #6   Title Pt will perform simple  home maintenance tasks with supervision.   Time 8   Period Weeks   Status On-going               Plan -  07/21/16 1919    Clinical Impression Statement Pt continues to make progress with RUE functional use and coordination.  Pt demo also reports that she was able to wash dishes at home since last visit.   Rehab Potential Good   OT Frequency 2x / week   OT Duration 8 weeks   OT Treatment/Interventions Self-care/ADL training;Therapeutic exercise;DME and/or AE instruction;Therapist, nutritional;Therapeutic activities;Patient/family education;Balance training;Neuromuscular education;Electrical Stimulation;Moist Heat;Energy conservation;Passive range of motion;Therapeutic exercises;Splinting;Manual Therapy;Fluidtherapy;Ultrasound;Cryotherapy;Parrafin   Plan neuro re-ed, functional eaching, coordination   OT Home Exercise Plan Education provided:  06/05/15 Initial HEP (table slides with light wt. bearing, tracing, and flipping cards); 06/22/16 supine ball ex and thumb opposition   Consulted and Agree with Plan of Care Patient;Family member/caregiver   Family Member Consulted husband      Patient will benefit from skilled therapeutic intervention in order to improve the following deficits and impairments:  Decreased coordination, Decreased activity tolerance, Decreased knowledge of use of DME, Decreased strength, Impaired UE functional use, Impaired tone, Decreased range of motion, Decreased balance, Decreased mobility, Difficulty walking  Visit Diagnosis: Hemiplegia and hemiparesis following cerebral infarction affecting right dominant side (HCC)  Other symptoms and signs involving the nervous system  Unsteadiness on feet  Other lack of coordination  Muscle weakness (generalized)  Other abnormalities of gait and mobility    Problem List Patient Active Problem List   Diagnosis Date Noted  . Slow transit constipation   . Acute blood loss anemia   . Adjustment disorder with  depressed mood   . Cerebrovascular accident (CVA) ([place]) 02/03/2016  . Dysarthria, post-stroke   . Dysphagia, post-stroke   . Leukocytosis   . Prediabetes   . Right hemiparesis ([place])   . Cerebral infarction due to unspecified mechanism   . Other secondary hypertension   . Overactive bladder   . Diastolic dysfunction   . Hyperlipidemia   . Essential hypertension   . Essential tremor   . Stroke (cerebrum) ([place]) 02/01/2016  . Facial droop due to stroke 02/01/2016  . Essential and other specified forms of tremor 02/20/2013  . Dysphonia 02/20/2013  . Trigeminal neuralgia 02/20/2013    [facility] 07/21/2016, 7:29 PM  [facility] [address] [place], [place], [address] Phone: [phone]   Fax:  [phone]  Name: Kelly [name] MRN: [id number] Date of Birth: [birth year]   Vianne Bulls, OTR/L [facility] [address] [place], [place]  [address] [phone] phone [phone] 07/21/16 7:29 PM

## 2016-07-21 NOTE — Therapy (Signed)
[facility] [address] [place], [place], [address] Phone: [phone]   Fax:  [phone]  Physical Therapy Treatment  Patient Details  Name: Kelly [name] MRN: [id number] Date of Birth: 05/15/1928 Referring Provider: Martin, Carolyn  Encounter Date: 07/21/2016      PT End of Session - 07/21/16 2155    Visit Number 13   Number of Visits 18   Date for PT Re-Evaluation 07/31/16   Authorization Type Medicare/[employer] [facility] every 10th visit   PT Start Time 1446   PT Stop Time 1512   PT Time Calculation (min) 26 min   Equipment Utilized During Treatment Gait belt   Activity Tolerance Treatment limited secondary to medical complications (Comment)  began vomiting and session concluded   Behavior During Therapy WFL for tasks assessed/performed      Past Medical History:  Diagnosis Date  . Acute blood loss anemia   . Adjustment disorder with depressed mood   . CVA (cerebral vascular accident) (HCC) 02/03/2016   Linear infarct within the posterior limb of left internal capsule  . Diastolic CHF (HCC)   . Dysphagia   . Dysphonia 02/20/2013  . Essential and other specified forms of tremor 02/20/2013  . HLD (hyperlipidemia)   . HTN (hypertension)   . OAB (overactive bladder)   . Osteoporosis   . Patient receiving subcutaneous heparin    For DVT prophylaxis 9/17  . Prediabetes   . Psoriasis   . Slow transit constipation   . Stroke (cerebrum) (HCC) 02/01/2016  . Trigeminal neuralgia 02/20/2013    Past Surgical History:  Procedure Laterality Date  . APPENDECTOMY  1940  . bladder tack    . CATARACT EXTRACTION Bilateral   . HEMORRHOID SURGERY  1960  . HUMERUS FRACTURE SURGERY    . LAPAROSCOPIC HYSTERECTOMY  2006  . torn rotator cuff    . WRIST FRACTURE SURGERY  2005   seconday shoulder 2001    There were no vitals filed for this visit.      Subjective Assessment - 07/21/16 1447    Subjective Reports she  had a near fall in the bathroom and scraped herself along the wall and caught herself. Daughter plans to take her shoe shopping to see if they can find something with less agressive rubber sole. Husband reports he has not yet looked into a leather toe cap.    Patient is accompained by: Family member  husband   Patient Stated Goals Pt's goal for therapy is to walk again.   Currently in Pain? Yes   Pain Score 4    Pain Location Rib cage   Pain Orientation Right   Pain Descriptors / Indicators Aching   Pain Type Acute pain   Pain Onset Today   Pain Frequency Constant                         [facility] Adult PT Treatment/Exercise - 07/21/16 2146      Transfers   Transfers Sit to Stand;Stand to Sit   Sit to Stand 4: Min guard;With upper extremity assist;With armrests   Stand to Sit 5: Supervision;With upper extremity assist     Ambulation/Gait   Ambulation/Gait Assistance 4: Min guard;4: Min assist   Ambulation/Gait Assistance Details pt unsteady and appeared anxious/nervous   Ambulation Distance (Feet) 40 Feet  40, 100   Assistive device Rolling walker   Gait Pattern Step-through pattern;Decreased step length - right;Decreased stance time - right;Decreased   step length - left;Trunk flexed;Narrow base of support;Right genu recurvatum;Poor foot clearance - left;Poor foot clearance - right;Lateral hip instability;Lateral trunk lean to left   Ambulation Surface Level;Indoor             Balance Exercises - 07/21/16 2151      Balance Exercises: Standing   Standing Eyes Opened Narrow base of support (BOS);Foam/compliant surface;1 rep  horiz/vertical head turns   Standing Eyes Closed Narrow base of support (BOS);Foam/compliant surface;1 rep;30 secs             PT Short Term Goals - 06/29/16 1453      PT SHORT TERM GOAL #1   Title Pt will perform HEP with family's supervision, to address balance, gait, transfers and strengthening.  TARGET 07/01/16   Baseline 06/29/16:  met per pt and spouse report   Time --   Period --   Status Achieved     PT SHORT TERM GOAL #2   Title Pt will improve Berg Balance score to at least 26/56 for decreased fall risk.   Baseline 06/29/16: 30/56 scored today   Time --   Period --   Status Achieved     PT SHORT TERM GOAL #3   Title Pt will improve TUG score to less than or equal to 50 seconds for decreased fall risk.   Baseline 06/29/16: 23.81 with RW   Time --   Period --   Status Achieved     PT SHORT TERM GOAL #4   Title Pt will transfer sit<>stand, modified independently, at least 4 of 5 trials, to demo improved lower extremity strength and transfer efficiency.   Baseline 06/29/16: pt at supervision level with cues for safety/sequencing needed at times   Time --   Period --   Status Partially Met     PT SHORT TERM GOAL #5   Title Pt will ambulate at least 200 ft using RW, supervision, for improved gait safety and efficiency.   Baseline 06/29/16: met today with RW   Time --   Period --   Status Achieved     PT SHORT TERM GOAL #6   Title Pt/family will verbalize understanding of CVA education.   Time 4   Period Weeks   Status On-going           PT Long Term Goals - 06/24/16 1926      PT LONG TERM GOAL #1   Title Pt/family will verbalize understanding of fall prevention in the home environment.  TARGET 07/31/16   Time 8   Period Weeks   Status On-going     PT LONG TERM GOAL #2   Title Pt will improve Berg Balance score to at least 31/56 for decreased fall risk.   Time 8   Period Weeks   Status On-going     PT LONG TERM GOAL #3   Title Pt will improve TUG score to less than or equal to 45 seconds for decreased fall risk.   Time 8   Period Weeks   Status On-going     PT LONG TERM GOAL #4   Title Pt will improve gait velocity to at least 1.8 ft/sec for improved gait efficiency and safety.   Time 8   Period Weeks   Status On-going     PT LONG TERM GOAL #5   Title Pt will ambulate at least 800 ft  indoor and outdoor surfaces, using RW, modified independently, for improved outdoor and community.   Time 8     Period Weeks   Status On-going     PT LONG TERM GOAL #6   Title Pt will improve Neuro QOL score on FOTO by at least 20% for improved functional mobility.   Time 8   Period Weeks   Status On-going               Plan - 07/21/16 2156    Clinical Impression Statement Skilled session included follow-up on more appropriate foot wear, ambulation, and balance. After completing balance exercises with horizontal vs vertical head turns, pt began to vomit. She denied any dizziness with this exercise and reported sudden onset of nausea with pt ultimately vomiting. Session concluded early due to illness   Rehab Potential Good   PT Frequency 2x / week   PT Duration 6 weeks   PT Treatment/Interventions ADLs/Self Care Home Management;Functional mobility training;Stair training;Gait training;Patient/family education;Neuromuscular re-education;Balance training;Therapeutic exercise;Therapeutic activities;DME Instruction   PT Next Visit Plan See if she had any luck with different shoes (told to try less gripping sole and even small heel) Continue gait training with R knee control and R foot clearance during gait; work on stepping recovery if LOB     PT Home Exercise Plan son (Allen Phung, PT) gave standing ex's at counter; pelvic tilts, bridging, standing bil ankle DF stretch w/ 2" block, standing knee flexion, hip abdct; seated knee flexion with tband, backward walking   Consulted and Agree with Plan of Care Patient;Family member/caregiver   Family Member Consulted Husband      Patient will benefit from skilled therapeutic intervention in order to improve the following deficits and impairments:  Abnormal gait, Decreased balance, Decreased cognition, Decreased knowledge of use of DME, Decreased range of motion, Decreased safety awareness, Decreased strength  Visit Diagnosis: Hemiplegia and  hemiparesis following cerebral infarction affecting right dominant side (HCC)  Other symptoms and signs involving the nervous system     Problem List Patient Active Problem List   Diagnosis Date Noted  . Slow transit constipation   . Acute blood loss anemia   . Adjustment disorder with depressed mood   . Cerebrovascular accident (CVA) (HCC) 02/03/2016  . Dysarthria, post-stroke   . Dysphagia, post-stroke   . Leukocytosis   . Prediabetes   . Right hemiparesis (HCC)   . Cerebral infarction due to unspecified mechanism   . Other secondary hypertension   . Overactive bladder   . Diastolic dysfunction   . Hyperlipidemia   . Essential hypertension   . Essential tremor   . Stroke (cerebrum) (HCC) 02/01/2016  . Facial droop due to stroke 02/01/2016  . Essential and other specified forms of tremor 02/20/2013  . Dysphonia 02/20/2013  . Trigeminal neuralgia 02/20/2013    Lynn P Sasser, PT 07/21/2016, 10:07 PM  [facility] [address] [place], [place], [address] Phone: [phone]   Fax:  [phone]  Name: Kelly [name] MRN: [id number] Date of Birth: 04/25/1928   

## 2016-07-21 NOTE — Patient Instructions (Signed)
Practice picking up different size bottles/plastic cups and work on opening the hand big and wrapping the thumb all the way around.   Pick up bottle caps/cotton balls focusing on using thumb and index finger.  "PINCH"

## 2016-07-23 ENCOUNTER — Ambulatory Visit: Payer: Medicare Other | Admitting: Occupational Therapy

## 2016-07-23 ENCOUNTER — Ambulatory Visit: Payer: Medicare Other | Admitting: Physical Therapy

## 2016-07-28 ENCOUNTER — Ambulatory Visit: Payer: Medicare Other | Attending: Nurse Practitioner | Admitting: Physical Therapy

## 2016-07-28 ENCOUNTER — Ambulatory Visit: Payer: Medicare Other | Admitting: Occupational Therapy

## 2016-07-28 DIAGNOSIS — I69351 Hemiplegia and hemiparesis following cerebral infarction affecting right dominant side: Secondary | ICD-10-CM

## 2016-07-28 DIAGNOSIS — R2689 Other abnormalities of gait and mobility: Secondary | ICD-10-CM

## 2016-07-28 DIAGNOSIS — M6281 Muscle weakness (generalized): Secondary | ICD-10-CM | POA: Insufficient documentation

## 2016-07-28 DIAGNOSIS — R2681 Unsteadiness on feet: Secondary | ICD-10-CM | POA: Insufficient documentation

## 2016-07-28 DIAGNOSIS — R278 Other lack of coordination: Secondary | ICD-10-CM | POA: Diagnosis not present

## 2016-07-28 DIAGNOSIS — R29818 Other symptoms and signs involving the nervous system: Secondary | ICD-10-CM | POA: Insufficient documentation

## 2016-07-28 NOTE — Therapy (Signed)
[facility] [address] [place], [place], [address] Phone: [phone]   Fax:  [phone]  Physical Therapy Treatment  Patient Details  Name: Kelly [name] MRN: [id number] Date of Birth: [birth year] Referring Provider: Cecille Rubin  Encounter Date: 07/28/2016      PT End of Session - 07/28/16 2201    Visit Number 14   Number of Visits 18   Date for PT Re-Evaluation 07/31/16   Authorization Type Medicare/[employer]-G[employer] every 10th visit   PT Start Time 1317   PT Stop Time 1400   PT Time Calculation (min) 43 min   Equipment Utilized During Treatment Gait belt   Activity Tolerance Patient tolerated treatment well   Behavior During Therapy [facility] for tasks assessed/performed      Past Medical History:  Diagnosis Date  . Acute blood loss anemia   . Adjustment disorder with depressed mood   . CVA (cerebral vascular accident) ([place]) 02/03/2016   Linear infarct within the posterior limb of left internal capsule  . Diastolic CHF ([place])   . Dysphagia   . Dysphonia 02/20/2013  . Essential and other specified forms of tremor 02/20/2013  . HLD (hyperlipidemia)   . HTN (hypertension)   . OAB (overactive bladder)   . Osteoporosis   . Patient receiving subcutaneous heparin    For DVT prophylaxis 9/17  . Prediabetes   . Psoriasis   . Slow transit constipation   . Stroke (cerebrum) ([place]) 02/01/2016  . Trigeminal neuralgia 02/20/2013    Past Surgical History:  Procedure Laterality Date  . APPENDECTOMY  1940  . bladder tack    . CATARACT EXTRACTION Bilateral   . HEMORRHOID SURGERY  1960  . HUMERUS FRACTURE SURGERY    . LAPAROSCOPIC HYSTERECTOMY  2006  . torn rotator cuff    . WRIST FRACTURE SURGERY  2005   seconday shoulder 2001    There were no vitals filed for this visit.      Subjective Assessment - 07/28/16 1323    Subjective No additional falls.  Had several more episodes where I threw up because of the  pain.  Today's the first day I'm doing better from  the pain.   Patient is accompained by: Family member  husband   Patient Stated Goals Pt's goal for therapy is to walk again.   Currently in Pain? Yes   Pain Score 4    Pain Location Rib cage   Pain Orientation Right;Posterior   Pain Descriptors / Indicators Sharp   Pain Type Acute pain   Pain Onset Today   Pain Frequency Intermittent   Aggravating Factors  near fall on R side last week; certain movements aggravate   Pain Relieving Factors rest, Advil                         [facility] Adult PT Treatment/Exercise - 07/28/16 1338      Transfers   Transfers Sit to Stand;Stand to Sit   Sit to Stand 4: Min guard;With upper extremity assist;With armrests   Stand to Sit 5: Supervision;With upper extremity assist   Stand to Sit Details Multiple reps throughout session, with occasional cues for hand placement     Ambulation/Gait   Ambulation/Gait Yes   Ambulation/Gait Assistance 4: Min guard;4: Min assist   Ambulation/Gait Assistance Details Cues provided at hips for lateral weightshifting, increased stance time on RLE and increased forward excursion of R pelvis   Ambulation Distance (Feet)  85 Feet  x 2, then 120 ft x 2   Assistive device Rolling walker   Gait Pattern Step-through pattern;Decreased step length - right;Decreased stance time - right;Decreased step length - left;Trunk flexed;Narrow base of support;Right genu recurvatum;Poor foot clearance - left;Poor foot clearance - right;Lateral hip instability;Lateral trunk lean to left   Ambulation Surface Level;Indoor   Gait velocity 20.95 sec = 1.56 ft/sec   Pre-Gait Activities At counter, stagger stance anterior/posterior weigthshifting, with cues at R quads/hamstrings to decrease R knee recurvatum.  Standing at RW:  forward step and weigthshift, x 10 reps each,f or improved forward weight excursion through RLE.     Standardized Balance Assessment   Standardized Balance  Assessment Timed Up and Go Test     Timed Up and Go Test   TUG Normal TUG   Normal TUG (seconds) 35.03  33.41 sec with RW                  PT Short Term Goals - 06/29/16 1453      PT SHORT TERM GOAL #1   Title Pt will perform HEP with family's supervision, to address balance, gait, transfers and strengthening.  TARGET 07/01/16   Baseline 06/29/16: met per pt and spouse report   Time --   Period --   Status Achieved     PT SHORT TERM GOAL #2   Title Pt will improve Berg Balance score to at least 26/56 for decreased fall risk.   Baseline 06/29/16: 30/56 scored today   Time --   Period --   Status Achieved     PT SHORT TERM GOAL #3   Title Pt will improve TUG score to less than or equal to 50 seconds for decreased fall risk.   Baseline 06/29/16: 23.81 with RW   Time --   Period --   Status Achieved     PT SHORT TERM GOAL #4   Title Pt will transfer sit<>stand, modified independently, at least 4 of 5 trials, to demo improved lower extremity strength and transfer efficiency.   Baseline 06/29/16: pt at supervision level with cues for safety/sequencing needed at times   Time --   Period --   Status Partially Met     PT SHORT TERM GOAL #5   Title Pt will ambulate at least 200 ft using RW, supervision, for improved gait safety and efficiency.   Baseline 06/29/16: met today with RW   Time --   Period --   Status Achieved     PT SHORT TERM GOAL #6   Title Pt/family will verbalize understanding of CVA education.   Time 4   Period Weeks   Status On-going           PT Long Term Goals - 07/28/16 2203      PT LONG TERM GOAL #1   Title Pt/family will verbalize understanding of fall prevention in the home environment.  TARGET 07/31/16   Time 8   Period Weeks   Status On-going     PT LONG TERM GOAL #2   Title Pt will improve Berg Balance score to at least 31/56 for decreased fall risk.   Time 8   Period Weeks   Status On-going     PT LONG TERM GOAL #3   Title Pt will  improve TUG score to less than or equal to 45 seconds for decreased fall risk.   Baseline 33.41 sec 07/28/16   Time 8   Period Weeks  Status Achieved     PT LONG TERM GOAL #4   Title Pt will improve gait velocity to at least 1.8 ft/sec for improved gait efficiency and safety.   Baseline 1.56 ft/sec with RW 07/28/16   Time 8   Period Weeks   Status Achieved     PT LONG TERM GOAL #5   Title Pt will ambulate at least 800 ft indoor and outdoor surfaces, using RW, modified independently, for improved outdoor and community.   Time 8   Period Weeks   Status On-going     PT LONG TERM GOAL #6   Title Pt will improve Neuro QOL score on FOTO by at least 20% for improved functional mobility.   Time 8   Period Weeks   Status On-going               Plan - 07/28/16 2202    Clinical Impression Statement Pt missed additional session due to pain and vomiting following near-fall last week.  Pt has not seen physician, but does report improved pain in R lower ribs area and no recent additional vomiting.  Pt has had limited mobility in the past week due to pain.  Began assessing LTGs, with pt meeting LTG 3 and 4.  Discussed POC, with plans to check remaining LTGs and update POC/recert for additional PT to address strengthening, balance, and gait training.   Rehab Potential Good   PT Frequency 2x / week   PT Duration 6 weeks   PT Treatment/Interventions ADLs/Self Care Home Management;Functional mobility training;Stair training;Gait training;Patient/family education;Neuromuscular re-education;Balance training;Therapeutic exercise;Therapeutic activities;DME Instruction   PT Next Visit Plan Follow up on pt getting different shoes (told to try less gripping sole and even small heel) .  Check remaining LTGs and complete renewal.   PT Home Exercise Plan son Zenia Resides [place], PT) gave standing ex's at counter; pelvic tilts, bridging, standing bil ankle DF stretch w/ 2" block, standing knee flexion, hip abdct;  seated knee flexion with tband, backward walking   Consulted and Agree with Plan of Care Patient;Family member/caregiver   Family Member Consulted Husband      Patient will benefit from skilled therapeutic intervention in order to improve the following deficits and impairments:  Abnormal gait, Decreased balance, Decreased cognition, Decreased knowledge of use of DME, Decreased range of motion, Decreased safety awareness, Decreased strength  Visit Diagnosis: Other abnormalities of gait and mobility  Muscle weakness (generalized)  Unsteadiness on feet     Problem List Patient Active Problem List   Diagnosis Date Noted  . Slow transit constipation   . Acute blood loss anemia   . Adjustment disorder with depressed mood   . Cerebrovascular accident (CVA) ([place]) 02/03/2016  . Dysarthria, post-stroke   . Dysphagia, post-stroke   . Leukocytosis   . Prediabetes   . Right hemiparesis ([place])   . Cerebral infarction due to unspecified mechanism   . Other secondary hypertension   . Overactive bladder   . Diastolic dysfunction   . Hyperlipidemia   . Essential hypertension   . Essential tremor   . Stroke (cerebrum) ([place]) 02/01/2016  . Facial droop due to stroke 02/01/2016  . Essential and other specified forms of tremor 02/20/2013  . Dysphonia 02/20/2013  . Trigeminal neuralgia 02/20/2013    MARRIOTT,AMY W. 07/28/2016, 10:13 PM Frazier Butt., PT [facility] [address] [place], [place], [address] Phone: [phone]   Fax:  [phone]  Name: Kelly [name] MRN: [id number] Date  of Birth: 10-Jan-1928

## 2016-07-28 NOTE — Therapy (Signed)
[facility] [address] [place], [place], [address] Phone: [phone]   Fax:  [phone]  Occupational Therapy Treatment  Patient Details  Name: Kelly [name] MRN: [id number] Date of Birth: November 02, 1927 Referring Provider: Cecille Rubin, NP  Encounter Date: 07/28/2016      OT End of Session - 07/28/16 1415    Visit Number 14   Number of Visits 17   Date for OT Re-Evaluation 07/29/16   Authorization Type Medicare / [employer] (g-code needed)   Authorization - Visit Number 14   Authorization - Number of Visits 20   OT Start Time 6734   OT Stop Time 1445   OT Time Calculation (min) 41 min   Activity Tolerance Patient tolerated treatment well   Behavior During Therapy [facility] for tasks assessed/performed      Past Medical History:  Diagnosis Date  . Acute blood loss anemia   . Adjustment disorder with depressed mood   . CVA (cerebral vascular accident) ([place]) 02/03/2016   Linear infarct within the posterior limb of left internal capsule  . Diastolic CHF ([place])   . Dysphagia   . Dysphonia 02/20/2013  . Essential and other specified forms of tremor 02/20/2013  . HLD (hyperlipidemia)   . HTN (hypertension)   . OAB (overactive bladder)   . Osteoporosis   . Patient receiving subcutaneous heparin    For DVT prophylaxis 9/17  . Prediabetes   . Psoriasis   . Slow transit constipation   . Stroke (cerebrum) ([place]) 02/01/2016  . Trigeminal neuralgia 02/20/2013    Past Surgical History:  Procedure Laterality Date  . APPENDECTOMY  1940  . bladder tack    . CATARACT EXTRACTION Bilateral   . HEMORRHOID SURGERY  1960  . HUMERUS FRACTURE SURGERY    . LAPAROSCOPIC HYSTERECTOMY  2006  . torn rotator cuff    . WRIST FRACTURE SURGERY  2005   seconday shoulder 2001    There were no vitals filed for this visit.      Subjective Assessment - 07/28/16 1451    Subjective  Pt reports that she feels much better now, but wasn't  able to do much last week after fall, but pt reports that she was able to work on grasping cups and pinching nuts   Patient is accompained by: Family member  husband   Pertinent History adjustment disorder with depressed mood, diastolic CHF, osteoporosis, hx of fall, essential tremor, HTN, prediabetes, trigeminal neuralgia   Limitations fall risk   Patient Stated Goals be able to write, fold clothes, do dishes   Currently in Pain? No/denies   Pain Onset Today       Pt able to pick up cards, sort/arrange in hands, place in cardholder (handheld) and then able to pull selected cards out of hand successfully in prep for returning to previous leisure activity of playing bridge.  Pt unable to use cardholder that sets on table due to tremors/decr stability.  Writing name/address with incr time and good legibility and size.  In sitting, grasp/release of cylinder object with improved grasp for larger object today.  Pt also demo improved grasp of 1-inch blocks using 2 point pinch.    Began checking goals and discussing progress in prep for renewal next session.--see goals section.  Discussed grooming tasks and RUE performance.  Pt reports difficulty/concerns with inability to floss teeth.  Pt does use electric toothbrush, reports difficulty using flosser picks or water pic.  Recommended pt rise mouth by drinking  water after eating and try use of mouthwash.                       OT Short Term Goals - 07/07/16 1627      OT SHORT TERM GOAL #1   Title Pt will be independent with initial HEP--check STGs 06/30/16   Time 4   Period Weeks   Status Achieved     OT SHORT TERM GOAL #2   Title Pt will be able to write name with at least 75% legibility.   Time 4   Period Weeks   Status Achieved  06/29/16:  met approx this level or higher for name (inconsistent at times)     OT SHORT TERM GOAL #3   Title Pt will demo at least 95* R shoulder flex with min compensation in prep for  functional reach.   Time 4   Period Weeks   Status Achieved  06/29/16:  met at approx this level.     OT SHORT TERM GOAL #4   Title Pt will improve functional reaching and grasp/release as shown by improving score on box and blocks by at least 5.   Baseline 3 blocks   Time 4   Period Weeks   Status Achieved  07/02/2016 21 blocks           OT Long Term Goals - 07/28/16 1434      OT LONG TERM GOAL #1   Title Pt will be independent with updated HEP--07/30/16   Time 8   Period Weeks   Status On-going  07/28/16:  met with current HEP, but would benefit from updates     OT LONG TERM GOAL #2   Title Pt will be able to write name with at least 95% legibility and simple sentence with at least 75% legibility.   Time 8   Period Weeks   Status Achieved     OT LONG TERM GOAL #3   Title Pt will demo at least 100* R shoulder flex with min compensation in prep for functional reach.   Period Weeks   Status Achieved  07/28/16:  100*      OT LONG TERM GOAL #4   Title Pt will improve functional reaching and grasp/release as shown by improving score on box and blocks to at least 25.  Revised 07/09/16:  as pt met initial goal of improving by 10   Baseline 3 blocks   Time 8   Period Weeks   Status On-going  07/09/16  Met, and revised goal     OT LONG TERM GOAL #5   Title Pt will be able to use dominant RUE for grooming tasks at least 75% of the time.   Time 8   Period Weeks   Status Partially Met  07/28/16  met for brushing teeth, not met for make-up (is having hair done by hairdresser 1x week)     OT LONG TERM GOAL #6   Title Pt will perform simple home maintenance tasks with supervision.   Time 8   Period Weeks   Status Achieved  07/28/16  folded clothes, washed dishes               Plan - 07/28/16 1416    Clinical Impression Statement Pt continues to make good progress with RUE functional use and coordination.  Pt demo improved ability to grasp/pinch objects, improved ability to  write and improved shoulder ROM today.   Rehab Potential Good  OT Frequency 2x / week   OT Duration 8 weeks   OT Treatment/Interventions Self-care/ADL training;Therapeutic exercise;DME and/or AE instruction;Therapist, nutritional;Therapeutic activities;Patient/family education;Balance training;Neuromuscular education;Electrical Stimulation;Moist Heat;Energy conservation;Passive range of motion;Therapeutic exercises;Splinting;Manual Therapy;Fluidtherapy;Ultrasound;Cryotherapy;Parrafin   Plan check remaining goals and renew nex visit (re-check box and blocks, check 9-hole peg test).   OT Home Exercise Plan Education provided:  06/05/15 Initial HEP (table slides with light wt. bearing, tracing, and flipping cards); 06/22/16 supine ball ex and thumb opposition   Consulted and Agree with Plan of Care Patient;Family member/caregiver   Family Member Consulted husband      Patient will benefit from skilled therapeutic intervention in order to improve the following deficits and impairments:  Decreased coordination, Decreased activity tolerance, Decreased knowledge of use of DME, Decreased strength, Impaired UE functional use, Impaired tone, Decreased range of motion, Decreased balance, Decreased mobility, Difficulty walking  Visit Diagnosis: Hemiplegia and hemiparesis following cerebral infarction affecting right dominant side (HCC)  Other symptoms and signs involving the nervous system  Unsteadiness on feet  Other lack of coordination  Muscle weakness (generalized)  Other abnormalities of gait and mobility    Problem List Patient Active Problem List   Diagnosis Date Noted  . Slow transit constipation   . Acute blood loss anemia   . Adjustment disorder with depressed mood   . Cerebrovascular accident (CVA) ([place]) 02/03/2016  . Dysarthria, post-stroke   . Dysphagia, post-stroke   . Leukocytosis   . Prediabetes   . Right hemiparesis ([place])   . Cerebral infarction due to unspecified  mechanism   . Other secondary hypertension   . Overactive bladder   . Diastolic dysfunction   . Hyperlipidemia   . Essential hypertension   . Essential tremor   . Stroke (cerebrum) ([place]) 02/01/2016  . Facial droop due to stroke 02/01/2016  . Essential and other specified forms of tremor 02/20/2013  . Dysphonia 02/20/2013  . Trigeminal neuralgia 02/20/2013    [facility] 07/28/2016, 4:45 PM  [place] [address] [place], [place], [address] Phone: [phone]   Fax:  [phone]  Name: Kelly [name] MRN: [id number] Date of Birth: [birth year]   Vianne Bulls, OTR/L [facility] [address] [place], [place]  [address] [phone] phone [phone] 07/28/16 4:56 PM

## 2016-07-29 ENCOUNTER — Encounter: Payer: Self-pay | Admitting: Physical Medicine & Rehabilitation

## 2016-07-29 ENCOUNTER — Encounter: Payer: Medicare Other | Attending: Physical Medicine & Rehabilitation | Admitting: Physical Medicine & Rehabilitation

## 2016-07-29 VITALS — BP 125/48 | HR 68

## 2016-07-29 DIAGNOSIS — G8111 Spastic hemiplegia affecting right dominant side: Secondary | ICD-10-CM | POA: Diagnosis not present

## 2016-07-29 DIAGNOSIS — I69351 Hemiplegia and hemiparesis following cerebral infarction affecting right dominant side: Secondary | ICD-10-CM | POA: Insufficient documentation

## 2016-07-29 DIAGNOSIS — L409 Psoriasis, unspecified: Secondary | ICD-10-CM | POA: Diagnosis not present

## 2016-07-29 DIAGNOSIS — I69392 Facial weakness following cerebral infarction: Secondary | ICD-10-CM

## 2016-07-29 DIAGNOSIS — R7303 Prediabetes: Secondary | ICD-10-CM | POA: Insufficient documentation

## 2016-07-29 DIAGNOSIS — G250 Essential tremor: Secondary | ICD-10-CM | POA: Diagnosis not present

## 2016-07-29 DIAGNOSIS — M810 Age-related osteoporosis without current pathological fracture: Secondary | ICD-10-CM | POA: Diagnosis not present

## 2016-07-29 DIAGNOSIS — E785 Hyperlipidemia, unspecified: Secondary | ICD-10-CM | POA: Insufficient documentation

## 2016-07-29 DIAGNOSIS — I110 Hypertensive heart disease with heart failure: Secondary | ICD-10-CM | POA: Diagnosis not present

## 2016-07-29 DIAGNOSIS — N3281 Overactive bladder: Secondary | ICD-10-CM | POA: Insufficient documentation

## 2016-07-29 DIAGNOSIS — I639 Cerebral infarction, unspecified: Secondary | ICD-10-CM

## 2016-07-29 DIAGNOSIS — I5030 Unspecified diastolic (congestive) heart failure: Secondary | ICD-10-CM | POA: Diagnosis not present

## 2016-07-29 DIAGNOSIS — F4321 Adjustment disorder with depressed mood: Secondary | ICD-10-CM | POA: Insufficient documentation

## 2016-07-29 DIAGNOSIS — IMO0002 Reserved for concepts with insufficient information to code with codable children: Secondary | ICD-10-CM

## 2016-07-29 NOTE — Progress Notes (Signed)
Subjective:    Patient ID: Kelly HumphreysBennie H Cinnamon, female    DOB: 15-Feb-1928, 83 y.o.   MRN: [id number]  HPI 83 year old right-handed female with history of essential tremor presents for follow up for infarct within the posterior limb of the left internal capsule.  Last clinic visit 06/18/16.  She had a Botox injection at that time. Presents with husband who provides majority of the history. She states she did very well with Botox.  She still has trouble with supination.  Husband states she has done well, but believes there is still room for improvement.   Pain Inventory Average Pain 0 Pain Right Now 0 My pain is no pain  In the last 24 hours, has pain interfered with the following? General activity 0 Relation with others 0 Enjoyment of life 0 What TIME of day is your pain at its worst? no pain Sleep (in general) Good  Pain is worse with: no pain Pain improves with: no pain Relief from Meds: no pain  Mobility walk with assistance use a walker ability to climb steps?  no do you drive?  no use a wheelchair  Function disabled: date disabled 02/01/2016 I need assistance with the following:  bathing, meal prep, household duties and shopping  Neuro/Psych tremor trouble walking  Prior Studies initial visit  Physicians involved in your care initial visit   Family History  Problem Relation Age of Onset  . Arthritis Brother     spine  . Osteoporosis Maternal Grandmother    Social History   Social History  . Marital status: Married    Spouse name: N/A  . Number of children: 3  . Years of education: college   Occupational History  . real estate     retired   Social History Main Topics  . Smoking status: Never Smoker  . Smokeless tobacco: Never Used  . Alcohol use Yes     Comment: Wine 2 glass daily  . Drug use: No  . Sexual activity: Not Asked   Other Topics Concern  . None   Social History Narrative   Lives at home with her husband.   Right-handed.   No  caffeine use.   Past Surgical History:  Procedure Laterality Date  . APPENDECTOMY  1940  . bladder tack    . CATARACT EXTRACTION Bilateral   . HEMORRHOID SURGERY  1960  . HUMERUS FRACTURE SURGERY    . LAPAROSCOPIC HYSTERECTOMY  2006  . torn rotator cuff    . WRIST FRACTURE SURGERY  2005   seconday shoulder 2001   Past Medical History:  Diagnosis Date  . Acute blood loss anemia   . Adjustment disorder with depressed mood   . CVA (cerebral vascular accident) (HCC) 02/03/2016   Linear infarct within the posterior limb of left internal capsule  . Diastolic CHF (H[facility])   . Dysphagia   . Dysphonia 02/20/2013  . Essential and other specified forms of tremor 02/20/2013  . HLD (hyperlipidemia)   . HTN (hypertension)   . OAB (overactive bladder)   . Osteoporosis   . Patient receiving subcutaneous heparin    For DVT prophylaxis 9/17  . Prediabetes   . Psoriasis   . Slow transit constipation   . Stroke (cerebrum) ([facility]) 02/01/2016  . Trigeminal neuralgia 02/20/2013   BP (!) 125/48   Pulse 68   SpO2 96%   Opioid Risk Score:   Fall Risk Score:  `1  Depression screen PHQ 2/9  [facility]  Decreased Interest 0  Down, Depressed, Hopeless 0  PHQ - 2 Score 0  Altered sleeping 0  Tired, decreased energy 0  Change in appetite 0  Feeling bad or failure about yourself  0  Trouble concentrating 0  Moving slowly or fidgety/restless 0  Suicidal thoughts 0  PHQ-9 Score 0    Review of Systems  Constitutional: Negative.   HENT: Negative.   Eyes: Negative.   Respiratory: Negative.   Cardiovascular: Negative.   Gastrointestinal: Negative.   Endocrine: Negative.   Genitourinary: Negative.   Musculoskeletal: Positive for gait problem.  Skin: Negative.   Allergic/Immunologic: Negative.   Neurological: Positive for tremors.  Hematological: Bruises/bleeds easily.  Psychiatric/Behavioral: Negative.   All other systems reviewed and are negative.       Objective:   Physical Exam Constitutional: She appears well-developed. Frail. NAD.  HENT: Normocephalic and atraumatic.  Eyes: EOMI. No discharge..  Cardiovascular: RRR.  No JVD. Respiratory: Effort normal and breath sounds normal.  GI: Soft. Bowel sounds are normal.  Musculoskeletal: She exhibits no edema or tenderness.  Neurological: She is alert and oriented.  HOH Mild dysarthria.  Right facial droop +LUE Resting tremor Motor: RUE: shoulder abduction, elbow flexion/extension 4/5, wrist 4/5, hand 4/5 RLE: 4+/5 hip flexion, knee extension, ankle dorsi/plantar flexion MAS: right elbow flexors 1/4, wrist flexors 1+/4, finger flexors 2/4 (improved) LUE/LLE: 5/5 proximal to distal  Psychiatric: She has a normal mood and affect. Her behavior is normal Skin: Skin is warm and dry    Assessment & Plan:  83 year old right-handed female with history of essential tremor presents for hospital follow for infarct within the posterior limb of the left internal capsule.  1. Late effects infarct within the posterior limb of the left internal capsule with spastic hemiplegia affecting right dominant side (G81.11).  On last visit:   Right FDS 30U (will increase to 50)   Right FCR 20U (will maintain)   Right FDP 20U (will maintain)   Will add Biceps 30U

## 2016-07-30 ENCOUNTER — Ambulatory Visit: Payer: Medicare Other | Admitting: Occupational Therapy

## 2016-07-30 ENCOUNTER — Encounter: Payer: Self-pay | Admitting: Physical Therapy

## 2016-07-30 ENCOUNTER — Ambulatory Visit: Payer: Medicare Other | Admitting: Physical Therapy

## 2016-07-30 DIAGNOSIS — M6281 Muscle weakness (generalized): Secondary | ICD-10-CM

## 2016-07-30 DIAGNOSIS — R278 Other lack of coordination: Secondary | ICD-10-CM | POA: Diagnosis not present

## 2016-07-30 DIAGNOSIS — R2689 Other abnormalities of gait and mobility: Secondary | ICD-10-CM

## 2016-07-30 DIAGNOSIS — R2681 Unsteadiness on feet: Secondary | ICD-10-CM

## 2016-07-30 DIAGNOSIS — R29818 Other symptoms and signs involving the nervous system: Secondary | ICD-10-CM

## 2016-07-30 DIAGNOSIS — I69351 Hemiplegia and hemiparesis following cerebral infarction affecting right dominant side: Secondary | ICD-10-CM

## 2016-07-30 NOTE — Therapy (Signed)
[facility] [address] [place], [place], [address] Phone: [phone]   Fax:  [phone]  Occupational Therapy Treatment  Patient Details  Name: Kelly [name] MRN: [id number] Date of Birth: 02-24-1928 Referring Provider: Cecille Rubin, NP  Encounter Date: 07/30/2016      OT End of Session - 07/30/16 1612    Visit Number 15   Number of Visits 30  14+16=30   Date for OT Re-Evaluation 09/28/16   Authorization Type Medicare / [employer] (g-code needed)   Authorization Time Period renewal completed 07/30/16   Authorization - Visit Number 15   Authorization - Number of Visits 20   OT Start Time 1451   OT Stop Time 1530   OT Time Calculation (min) 39 min   Activity Tolerance Patient tolerated treatment well   Behavior During Therapy [facility] for tasks assessed/performed      Past Medical History:  Diagnosis Date  . Acute blood loss anemia   . Adjustment disorder with depressed mood   . CVA (cerebral vascular accident) ([place]) 02/03/2016   Linear infarct within the posterior limb of left internal capsule  . Diastolic CHF ([place])   . Dysphagia   . Dysphonia 02/20/2013  . Essential and other specified forms of tremor 02/20/2013  . HLD (hyperlipidemia)   . HTN (hypertension)   . OAB (overactive bladder)   . Osteoporosis   . Patient receiving subcutaneous heparin    For DVT prophylaxis 9/17  . Prediabetes   . Psoriasis   . Slow transit constipation   . Stroke (cerebrum) ([place]) 02/01/2016  . Trigeminal neuralgia 02/20/2013    Past Surgical History:  Procedure Laterality Date  . APPENDECTOMY  1940  . bladder tack    . CATARACT EXTRACTION Bilateral   . HEMORRHOID SURGERY  1960  . HUMERUS FRACTURE SURGERY    . LAPAROSCOPIC HYSTERECTOMY  2006  . torn rotator cuff    . WRIST FRACTURE SURGERY  2005   seconday shoulder 2001    There were no vitals filed for this visit.      Subjective Assessment - 07/30/16 1457     Subjective  Pt reports that she was able to brush her hair with her RUE.  Sore again after PT (due to fall last week).   Patient is accompained by: Family member  husband   Pertinent History adjustment disorder with depressed mood, diastolic CHF, osteoporosis, hx of fall, essential tremor, HTN, prediabetes, trigeminal neuralgia   Limitations fall risk   Patient Stated Goals be able to write, fold clothes, do dishes   Currently in Pain? Yes   Pain Score 5    Pain Location --  ribcage   Pain Orientation Right;Posterior   Pain Descriptors / Indicators Sharp   Pain Type Acute pain   Pain Onset In the past 7 days   Pain Frequency Intermittent   Aggravating Factors  fall on R side last week; certain movements aggravate   Pain Relieving Factors rest, Advil          Checked remaining goals and discussed progress--goals section.  In sitting, AAROM shoulder flex and chest press with ball with min cues/facilitation.  In sitting, wt. Bearing through R hand with body on arm movements/lateral wt. Shift for decr tone and incr tricep activation.  In sitting, low range functional reaching to remove cylinder objects with min-mod cues for posture, elbow ext, and to use 2-point pinch and lateral reaching with focus on elbow extension (to place in  container).  Cutting bread with butter knife with min facilitation for grasp/RUE (hand over hand) with less facilitation with repetition.    9-hole peg test:  R hand with >85mn (but able to put in with incr time on 2nd attempt).                     OT Short Term Goals - 07/30/16 1622      OT SHORT TERM GOAL #1   Title Pt will be independent with initial HEP--check STGs 06/30/16   Time 4   Period Weeks   Status Achieved     OT SHORT TERM GOAL #2   Title Pt will be able to write name with at least 75% legibility.   Time 4   Period Weeks   Status Achieved  06/29/16:  met approx this level or higher for name (inconsistent at times)      OT SHORT TERM GOAL #3   Title Pt will demo at least 95* R shoulder flex with min compensation in prep for functional reach.   Time 4   Period Weeks   Status Achieved  06/29/16:  met at approx this level.     OT SHORT TERM GOAL #4   Title Pt will improve functional reaching and grasp/release as shown by improving score on box and blocks by at least 5.   Baseline 3 blocks   Time 4   Period Weeks   Status Achieved  07/02/2016 21 blocks     OT SHORT TERM GOAL #5   Title Pt will be able to cut food mod I.--check updated STGs 08/29/16   Period Weeks   Status New     Additional Short Term Goals   Additional Short Term Goals Yes     OT SHORT TERM GOAL #6   Title Pt will be able to write 2-3 sentences with 100% legibility.   Time 4   Period Weeks   Status New           OT Long Term Goals - 07/30/16 1618      OT LONG TERM GOAL #1   Title Pt will be independent with updated HEP--check updated LTGs 09/28/16   Time 8   Period Weeks   Status On-going  07/28/16:  met with current HEP, but would benefit from updates     OT LONG TERM GOAL #2   Title Pt will be able to write name with at least 95% legibility and simple sentence with at least 75% legibility.   Time 8   Period Weeks   Status Achieved  07/30/16:  metl (name and sentence with 100% legibility)     OT LONG TERM GOAL #3   Title Pt will demo at least 110* R shoulder flex with min compensation in prep for functional reach.   Period Weeks   Status Revised  07/28/16:  100* met initial goal.       OT LONG TERM GOAL #4   Title Pt will improve functional reaching and grasp/release as shown by improving score on box and blocks to at least 25.  Revised 07/09/16:  as pt met initial goal of improving by 10   Baseline 3 blocks   Time 8   Period Weeks   Status On-going  07/09/16  Met, and revised goal;  07/30/16  17 blocks     OT LONG TERM GOAL #5   Title Pt will be able to use dominant RUE for grooming tasks at  least 75% of the time.    Time 8   Period Weeks   Status Partially Met  07/28/16  met for brushing teeth, not met for make-up (is having hair done by hairdresser 1x week but is able to brush hair)     Long Term Additional Goals   Additional Long Term Goals Yes     OT LONG TERM GOAL #6   Title Pt will perform simple home maintenance tasks mod I.   Time 8   Period Weeks   Status Revised  07/28/16  met folded clothes, washed dishes (supervision) and revised goal     OT LONG TERM GOAL #7   Title Pt will demo improved coordination for ADLs as shown by completing 9-hole peg test in or less.   Time 8   Period Weeks   Status New               Plan - 07/30/16 1613    Clinical Impression Statement Pt continues to make good progress with RUE functional use and coordination.  Pt concerned that Botox may be wearing off and in scheduled for more Botox 09/17/16.  Pt would benefit from continued occupational therapy to maximize RUE functional use and incr independence with ADLs/IADLs.   Rehab Potential Good   OT Frequency 2x / week   OT Duration --  9 weeks (renewal visit+8 weeks)   OT Treatment/Interventions Self-care/ADL training;Therapeutic exercise;DME and/or AE instruction;Building services engineer;Therapeutic activities;Patient/family education;Balance training;Neuromuscular education;Electrical Stimulation;Moist Heat;Energy conservation;Passive range of motion;Therapeutic exercises;Splinting;Manual Therapy;Fluidtherapy;Ultrasound;Cryotherapy;Parrafin   Plan renewal completed 07/30/16;  continue with neuro re-ed, RUE functional use   OT Home Exercise Plan Education provided:  06/05/15 Initial HEP (table slides with light wt. bearing, tracing, and flipping cards); 06/22/16 supine ball ex and thumb opposition   Consulted and Agree with Plan of Care Patient;Family member/caregiver   Family Member Consulted husband      Patient will benefit from skilled therapeutic intervention in order to improve the  following deficits and impairments:  Decreased coordination, Decreased activity tolerance, Decreased knowledge of use of DME, Decreased strength, Impaired UE functional use, Impaired tone, Decreased range of motion, Decreased balance, Decreased mobility, Difficulty walking  Visit Diagnosis: Hemiplegia and hemiparesis following cerebral infarction affecting right dominant side (HCC)  Other symptoms and signs involving the nervous system  Unsteadiness on feet  Other lack of coordination  Muscle weakness (generalized)  Other abnormalities of gait and mobility    Problem List Patient Active Problem List   Diagnosis Date Noted  . Slow transit constipation   . Acute blood loss anemia   . Adjustment disorder with depressed mood   . Cerebrovascular accident (CVA) (HCC) 02/03/2016  . Dysarthria, post-stroke   . Dysphagia, post-stroke   . Leukocytosis   . Prediabetes   . Right hemiparesis (HCC)   . Cerebral infarction due to unspecified mechanism   . Other secondary hypertension   . Overactive bladder   . Diastolic dysfunction   . Hyperlipidemia   . Essential hypertension   . Essential tremor   . Stroke (cerebrum) ([facility]) 02/01/2016  . Facial droop due to stroke 02/01/2016  . Essential and other specified forms of tremor 02/20/2013  . Dysphonia 02/20/2013  . Trigeminal neuralgia 02/20/2013    [facility] 07/30/2016, 4:54 PM   [facility] [address] [place], [place], [address] Phone: [phone]   Fax:  [phone]  Name: Kelly [name] MRN: [id number] Date of Birth: 01/26/1928   Willa Frater,  OTR/L [facility] [place], [place]  [address] [phone] phone [phone] 07/30/16 5:00 PM

## 2016-07-30 NOTE — Therapy (Signed)
[facility] [address] [place], [place], [address] Phone: [phone]   Fax:  [phone]  Physical Therapy Treatment  Patient Details  Name: Kelly [name] MRN: [id number] Date of Birth: [birth year] Referring Provider: Cecille Rubin  Encounter Date: 07/30/2016    Past Medical History:  Diagnosis Date  . Acute blood loss anemia   . Adjustment disorder with depressed mood   . CVA (cerebral vascular accident) ([place]) 02/03/2016   Linear infarct within the posterior limb of left internal capsule  . Diastolic CHF ([place])   . Dysphagia   . Dysphonia 02/20/2013  . Essential and other specified forms of tremor 02/20/2013  . HLD (hyperlipidemia)   . HTN (hypertension)   . OAB (overactive bladder)   . Osteoporosis   . Patient receiving subcutaneous heparin    For DVT prophylaxis 9/17  . Prediabetes   . Psoriasis   . Slow transit constipation   . Stroke (cerebrum) ([place]) 02/01/2016  . Trigeminal neuralgia 02/20/2013    Past Surgical History:  Procedure Laterality Date  . APPENDECTOMY  1940  . bladder tack    . CATARACT EXTRACTION Bilateral   . HEMORRHOID SURGERY  1960  . HUMERUS FRACTURE SURGERY    . LAPAROSCOPIC HYSTERECTOMY  2006  . torn rotator cuff    . WRIST FRACTURE SURGERY  2005   seconday shoulder 2001    There were no vitals filed for this visit.      Subjective Assessment - 07/30/16 1410    Subjective Reports she was very sore after Tuesday PT session (her back hurts more with walking). Has been using her w/c in the home due to severe pain that leads to vomiting.    Patient is accompained by: Family member  husband   Patient Stated Goals Pt's goal for therapy is to walk again.   Pain Onset Today                                   PT Short Term Goals - 06/29/16 1453      PT SHORT TERM GOAL #1   Title Pt will perform HEP with family's supervision, to address balance,  gait, transfers and strengthening.  TARGET 07/01/16   Baseline 06/29/16: met per pt and spouse report   Time --   Period --   Status Achieved     PT SHORT TERM GOAL #2   Title Pt will improve Berg Balance score to at least 26/56 for decreased fall risk.   Baseline 06/29/16: 30/56 scored today   Time --   Period --   Status Achieved     PT SHORT TERM GOAL #3   Title Pt will improve TUG score to less than or equal to 50 seconds for decreased fall risk.   Baseline 06/29/16: 23.81 with RW   Time --   Period --   Status Achieved     PT SHORT TERM GOAL #4   Title Pt will transfer sit<>stand, modified independently, at least 4 of 5 trials, to demo improved lower extremity strength and transfer efficiency.   Baseline 06/29/16: pt at supervision level with cues for safety/sequencing needed at times   Time --   Period --   Status Partially Met     PT SHORT TERM GOAL #5   Title Pt will ambulate at least 200 ft using RW, supervision, for improved gait safety and efficiency.  Baseline 06/29/16: met today with RW   Time --   Period --   Status Achieved     PT SHORT TERM GOAL #6   Title Pt/family will verbalize understanding of CVA education.   Time 4   Period Weeks   Status On-going           PT Long Term Goals - 07/28/16 2203      PT LONG TERM GOAL #1   Title Pt/family will verbalize understanding of fall prevention in the home environment.  TARGET 07/31/16   Time 8   Period Weeks   Status On-going     PT LONG TERM GOAL #2   Title Pt will improve Berg Balance score to at least 31/56 for decreased fall risk.   Time 8   Period Weeks   Status On-going     PT LONG TERM GOAL #3   Title Pt will improve TUG score to less than or equal to 45 seconds for decreased fall risk.   Baseline 33.41 sec 07/28/16   Time 8   Period Weeks   Status Achieved     PT LONG TERM GOAL #4   Title Pt will improve gait velocity to at least 1.8 ft/sec for improved gait efficiency and safety.   Baseline 1.56  ft/sec with RW 07/28/16   Time 8   Period Weeks   Status Achieved     PT LONG TERM GOAL #5   Title Pt will ambulate at least 800 ft indoor and outdoor surfaces, using RW, modified independently, for improved outdoor and community.   Time 8   Period Weeks   Status On-going     PT LONG TERM GOAL #6   Title Pt will improve Neuro QOL score on FOTO by at least 20% for improved functional mobility.   Time 8   Period Weeks   Status On-going             Patient will benefit from skilled therapeutic intervention in order to improve the following deficits and impairments:     Visit Diagnosis: No diagnosis found.     Problem List Patient Active Problem List   Diagnosis Date Noted  . Slow transit constipation   . Acute blood loss anemia   . Adjustment disorder with depressed mood   . Cerebrovascular accident (CVA) ([place]) 02/03/2016  . Dysarthria, post-stroke   . Dysphagia, post-stroke   . Leukocytosis   . Prediabetes   . Right hemiparesis ([place])   . Cerebral infarction due to unspecified mechanism   . Other secondary hypertension   . Overactive bladder   . Diastolic dysfunction   . Hyperlipidemia   . Essential hypertension   . Essential tremor   . Stroke (cerebrum) ([place]) 02/01/2016  . Facial droop due to stroke 02/01/2016  . Essential and other specified forms of tremor 02/20/2013  . Dysphonia 02/20/2013  . Trigeminal neuralgia 02/20/2013    Rexanne Mano 07/30/2016, 2:29 PM  [facility] [address] [place], [place], [address] Phone: [phone]   Fax:  [phone]  Name: Kelly [name] MRN: [id number] Date of Birth: May 24, 1928

## 2016-08-04 ENCOUNTER — Ambulatory Visit: Payer: Medicare Other | Admitting: Occupational Therapy

## 2016-08-04 ENCOUNTER — Encounter: Payer: Self-pay | Admitting: Physical Therapy

## 2016-08-04 ENCOUNTER — Ambulatory Visit: Payer: Medicare Other | Admitting: Physical Therapy

## 2016-08-04 DIAGNOSIS — I69351 Hemiplegia and hemiparesis following cerebral infarction affecting right dominant side: Secondary | ICD-10-CM

## 2016-08-04 DIAGNOSIS — R29818 Other symptoms and signs involving the nervous system: Secondary | ICD-10-CM | POA: Diagnosis not present

## 2016-08-04 DIAGNOSIS — R2681 Unsteadiness on feet: Secondary | ICD-10-CM | POA: Diagnosis not present

## 2016-08-04 DIAGNOSIS — M6281 Muscle weakness (generalized): Secondary | ICD-10-CM | POA: Diagnosis not present

## 2016-08-04 DIAGNOSIS — R278 Other lack of coordination: Secondary | ICD-10-CM

## 2016-08-04 DIAGNOSIS — R2689 Other abnormalities of gait and mobility: Secondary | ICD-10-CM

## 2016-08-04 NOTE — Therapy (Signed)
[facility] [address] [place], [place], [address] Phone: [phone]   Fax:  [phone]  Physical Therapy Treatment  Patient Details  Name: Kelly [name] MRN: [id number] Date of Birth: 20-May-1928 Referring Provider: Cecille Rubin  Encounter Date: 08/04/2016      PT End of Session - 08/04/16 1537    Visit Number 15   Number of Visits 34  89+21 (re-cert 3/8   Date for PT Re-Evaluation 09/25/16   Authorization Type Medicare/[place] [employer] every 10th visit   Authorization Time Period 07/30/16- 09/25/16   PT Start Time 1532   PT Stop Time 1615   PT Time Calculation (min) 43 min   Equipment Utilized During Treatment Gait belt   Activity Tolerance Patient tolerated treatment well   Behavior During Therapy [facility] for tasks assessed/performed      Past Medical History:  Diagnosis Date  . Acute blood loss anemia   . Adjustment disorder with depressed mood   . CVA (cerebral vascular accident) ([place]) 02/03/2016   Linear infarct within the posterior limb of left internal capsule  . Diastolic CHF ([place])   . Dysphagia   . Dysphonia 02/20/2013  . Essential and other specified forms of tremor 02/20/2013  . HLD (hyperlipidemia)   . HTN (hypertension)   . OAB (overactive bladder)   . Osteoporosis   . Patient receiving subcutaneous heparin    For DVT prophylaxis 9/17  . Prediabetes   . Psoriasis   . Slow transit constipation   . Stroke (cerebrum) ([place]) 02/01/2016  . Trigeminal neuralgia 02/20/2013    Past Surgical History:  Procedure Laterality Date  . APPENDECTOMY  1940  . bladder tack    . CATARACT EXTRACTION Bilateral   . HEMORRHOID SURGERY  1960  . HUMERUS FRACTURE SURGERY    . LAPAROSCOPIC HYSTERECTOMY  2006  . torn rotator cuff    . WRIST FRACTURE SURGERY  2005   seconday shoulder 2001    There were no vitals filed for this visit.      Subjective Assessment - 08/04/16 1533    Subjective No new  complaints. Reports her back is better today. To clinic with RW today.    Patient is accompained by: Family member  spouse   Patient Stated Goals Pt's goal for therapy is to walk again.            [facility] Adult PT Treatment/Exercise - 08/04/16 1540      Transfers   Transfers Sit to Stand;Stand to Sit   Sit to Stand 4: Min guard;With upper extremity assist;Other (comment)   Stand to Sit 4: Min guard;With upper extremity assist;Other (comment)   Transfer Cueing cues on technique to sit/stand to/from various surfaces. 1st surface: simulated sofa with use of left arm rest x 5 reps, min assist with 1st rep progressing to min guard assist. 2cd surface: chair without arm rests- cues on technique to turn sideways and use back of chair/seat of chair to push up to stand x 5 reps with min guard assit; 3rd surface: practiced walking to "kitchen" chair (straight chair without armrests), pulling it out, sitting down, scooting to table, scooting out and then using table to stand. min assist needed to scoot the chair forward and backward. Will need additional practice with this before she is ready to use her kitchen chairs at home with just spouse assist. Pt is safe to begin to use other furniture in her home, vs just the wheelchair.  Ambulation/Gait   Ambulation/Gait Yes   Ambulation/Gait Assistance 4: Min guard   Ambulation/Gait Assistance Details use of 1/2 inch heel wedge and simulated toe cap: no toe scuffing noted with gait and minimal recurvatum (maybe 2-3 times toward end of gait distance) noted with gait. no increase in back pain reported with gait. cues needed to look forward vs at feet with gait.    Ambulation Distance (Feet) 230 Feet   Assistive device Rolling walker   Gait Pattern Step-through pattern;Decreased stride length   Ambulation Surface Level;Indoor             PT Short Term Goals - 06/29/16 1453      PT SHORT TERM GOAL #1   Title Pt will  perform HEP with family's supervision, to address balance, gait, transfers and strengthening.  TARGET 07/01/16   Baseline 06/29/16: met per pt and spouse report   Time --   Period --   Status Achieved     PT SHORT TERM GOAL #2   Title Pt will improve Berg Balance score to at least 26/56 for decreased fall risk.   Baseline 06/29/16: 30/56 scored today   Time --   Period --   Status Achieved     PT SHORT TERM GOAL #3   Title Pt will improve TUG score to less than or equal to 50 seconds for decreased fall risk.   Baseline 06/29/16: 23.81 with RW   Time --   Period --   Status Achieved     PT SHORT TERM GOAL #4   Title Pt will transfer sit<>stand, modified independently, at least 4 of 5 trials, to demo improved lower extremity strength and transfer efficiency.   Baseline 06/29/16: pt at supervision level with cues for safety/sequencing needed at times   Time --   Period --   Status Partially Met     PT SHORT TERM GOAL #5   Title Pt will ambulate at least 200 ft using RW, supervision, for improved gait safety and efficiency.   Baseline 06/29/16: met today with RW   Time --   Period --   Status Achieved     PT SHORT TERM GOAL #6   Title Pt/family will verbalize understanding of CVA education.   Time 4   Period Weeks   Status On-going           PT Long Term Goals - 07/30/16 2116      PT LONG TERM GOAL #1   Title Pt/family will verbalize understanding of fall prevention in the home environment.  TARGET 07/31/16   Time 8   Period Weeks   Status On-going     PT LONG TERM GOAL #2   Title Pt will improve Berg Balance score to at least 31/56 for decreased fall risk.   Time 8   Period Weeks   Status On-going     PT LONG TERM GOAL #3   Title Pt will improve TUG score to less than or equal to 45 seconds for decreased fall risk.   Baseline 33.41 sec 07/28/16   Time 8   Period Weeks   Status Achieved     PT LONG TERM GOAL #4   Title Pt will improve gait velocity to at least 1.8  ft/sec for improved gait efficiency and safety.   Baseline 1.56 ft/sec with RW 07/28/16   Time 8   Period Weeks   Status Achieved     PT LONG TERM GOAL #5   Title Pt  will ambulate at least 800 ft indoor and outdoor surfaces, using RW, modified independently, for improved outdoor and community.   Time 8   Period Weeks   Status On-going     PT LONG TERM GOAL #6   Title Pt will improve Neuro QOL score on FOTO by at least 20% for improved functional mobility.   Time 8   Period Weeks   Status On-going          Plan - 08/04/16 1537    Clinical Impression Statement today's skilled session focused on gait with heel wedge and simulated toe cap which combined improved her gait with more reciprocal steps and not toe scuffing. Remainder of session addressed sit/stand transfers from various household surfaces as pt/spouse report she is still using the wheelchair as a "chair" in the home because it's easier to stand from and move in/out of table with dinners. Pt does need additional practice with moving kitchen chair in/out to/from table as she needs up to min assist and unsure if spouse can safetly provide this assistance. Pt is safe to begin using her sofas and living room furniture at this time, which allows her wheelchair to just stay at the kitchen table. Pt is making steady progress toward goals and should benefit from continued PT to progress toward unmet goals.                                   Rehab Potential Good   PT Frequency 2x / week   PT Duration 8 weeks  updated 3/8   PT Treatment/Interventions ADLs/Self Care Home Management;Functional mobility training;Stair training;Gait training;Patient/family education;Neuromuscular re-education;Balance training;Therapeutic exercise;Therapeutic activities;DME Instruction   PT Next Visit Plan continue to work on gait with heel wedge and simualted toe cap until pt can have shoe modificaiton made (? if request has been sent to MD to obtain this order,  will follow up with primary PT), continued to work on transfers from soft/lower surfaces and continue to work on standing balance                      PT Home Exercise Plan son Zenia [place]ides [place], PT) gave standing ex's at counter; pelvic tilts, bridging, standing bil ankle DF stretch w/ 2" block, standing knee flexion, hip abdct; seated knee flexion with tband, backward walking   Consulted and Agree with Plan of Care Patient;Family member/caregiver   Family Member Consulted Husband      Patient will benefit from skilled therapeutic intervention in order to improve the following deficits and impairments:  Abnormal gait, Decreased balance, Decreased cognition, Decreased knowledge of use of DME, Decreased range of motion, Decreased safety awareness, Decreased strength  Visit Diagnosis: Hemiplegia and hemiparesis following cerebral infarction affecting right dominant side (HCC)  Other symptoms and signs involving the nervous system  Unsteadiness on feet  Muscle weakness (generalized)  Other abnormalities of gait and mobility     Problem List Patient Active Problem List   Diagnosis Date Noted  . Slow transit constipation   . Acute blood loss anemia   . Adjustment disorder with depressed mood   . Cerebrovascular accident (CVA) ([place]) 02/03/2016  . Dysarthria, post-stroke   . Dysphagia, post-stroke   . Leukocytosis   . Prediabetes   . Right hemiparesis ([place])   . Cerebral infarction due to unspecified mechanism   . Other secondary hypertension   . Overactive bladder   .  Diastolic dysfunction   . Hyperlipidemia   . Essential hypertension   . Essential tremor   . Stroke (cerebrum) ([place]) 02/01/2016  . Facial droop due to stroke 02/01/2016  . Essential and other specified forms of tremor 02/20/2013  . Dysphonia 02/20/2013  . Trigeminal neuralgia 02/20/2013    Willow Ora, PTA, [facility] [address] [place],  [address] [phone] 08/04/16,  9:07 PM   Name: Kelly [name] MRN: [id number] Date of Birth: [birth year]

## 2016-08-04 NOTE — Therapy (Signed)
[facility] [address] [name], [place], [address] Phone: [phone]   Fax:  [phone]  Occupational Therapy Treatment  Patient Details  Name: Kelly [name] MRN: [id number] Date of Birth: 1927/11/17 Referring Provider: Cecille Rubin, NP  Encounter Date: 08/04/2016      OT End of Session - 08/04/16 1448    Visit Number 16   Number of Visits 30  14+16=30   Date for OT Re-Evaluation 09/28/16   Authorization Type Medicare / [employer] (g-code needed)   Authorization Time Period renewal completed 07/30/16   Authorization - Visit Number 16   Authorization - Number of Visits 20   OT Start Time 1449   OT Stop Time 1530   OT Time Calculation (min) 41 min   Activity Tolerance Patient tolerated treatment well   Behavior During Therapy [facility] for tasks assessed/performed      Past Medical History:  Diagnosis Date  . Acute blood loss anemia   . Adjustment disorder with depressed mood   . CVA (cerebral vascular accident) ([place]) 02/03/2016   Linear infarct within the posterior limb of left internal capsule  . Diastolic CHF ([place])   . Dysphagia   . Dysphonia 02/20/2013  . Essential and other specified forms of tremor 02/20/2013  . HLD (hyperlipidemia)   . HTN (hypertension)   . OAB (overactive bladder)   . Osteoporosis   . Patient receiving subcutaneous heparin    For DVT prophylaxis 9/17  . Prediabetes   . Psoriasis   . Slow transit constipation   . Stroke (cerebrum) ([place]) 02/01/2016  . Trigeminal neuralgia 02/20/2013    Past Surgical History:  Procedure Laterality Date  . APPENDECTOMY  1940  . bladder tack    . CATARACT EXTRACTION Bilateral   . HEMORRHOID SURGERY  1960  . HUMERUS FRACTURE SURGERY    . LAPAROSCOPIC HYSTERECTOMY  2006  . torn rotator cuff    . WRIST FRACTURE SURGERY  2005   seconday shoulder 2001    There were no vitals filed for this visit.      Subjective Assessment - 08/04/16 1448     Subjective  Pt reports that she was able to cut food some at home   Patient is accompained by: Family member  husband   Pertinent History adjustment disorder with depressed mood, diastolic CHF, osteoporosis, hx of fall, essential tremor, HTN, prediabetes, trigeminal neuralgia   Limitations fall risk   Patient Stated Goals be able to write, fold clothes, do dishes   Currently in Pain? No/denies   Pain Onset In the past 7 days            In supine, AAROM BUEs shoulder flex and chest press with ball with min v.c.  Then with 2lb wt. (BUEs) with min facilitation x10 each.  In supine, Reaching in diagonal pattern with finger and elbow ext and trunk enlongation in prep for functional reach with min cueing/facilitation, improved with repetition.  In sitting, low range functional reaching to grasp/release cylinder objects with min cueing for full finger ext and occasional intermittent full composite ext stretch of wrist/fingers.  Then mid range reaching to release objects with min-mod cueing for elbow ext.    Pt instructed in how to perform full composite extension stretch of wrist/fingers (prayer position, wt. Bearing) and importance of performing frequently, particularly if having difficulty with full finger ext due to spasticity.  Pt verbalized understanding/return demo but would benefit from review.  Pt needed intermittent rest breaks  due to fatigue.  Arm bike x85mn level 1 for reciprocal movement with min cueing initially, no rest needed.                     OT Education - 08/04/16 1548    Education Details Full composite finger/wrist ext stretch (prayer stretch, wt. bearing)   Person(s) Educated Patient;Spouse   Methods Explanation;Demonstration;Tactile cues;Verbal cues   Comprehension Need further instruction;Verbal cues required;Returned demonstration;Verbalized understanding          OT Short Term Goals - 07/30/16 1622      OT SHORT TERM GOAL #1   Title Pt will  be independent with initial HEP--check STGs 06/30/16   Time 4   Period Weeks   Status Achieved     OT SHORT TERM GOAL #2   Title Pt will be able to write name with at least 75% legibility.   Time 4   Period Weeks   Status Achieved  06/29/16:  met approx this level or higher for name (inconsistent at times)     OT SHORT TERM GOAL #3   Title Pt will demo at least 95* R shoulder flex with min compensation in prep for functional reach.   Time 4   Period Weeks   Status Achieved  06/29/16:  met at approx this level.     OT SHORT TERM GOAL #4   Title Pt will improve functional reaching and grasp/release as shown by improving score on box and blocks by at least 5.   Baseline 3 blocks   Time 4   Period Weeks   Status Achieved  07/02/2016 21 blocks     OT SHORT TERM GOAL #5   Title Pt will be able to cut food mod I.--check updated STGs 08/29/16   Period Weeks   Status New     Additional Short Term Goals   Additional Short Term Goals Yes     OT SHORT TERM GOAL #6   Title Pt will be able to write 2-3 sentences with 100% legibility.   Time 4   Period Weeks   Status New           OT Long Term Goals - 07/30/16 1618      OT LONG TERM GOAL #1   Title Pt will be independent with updated HEP--check updated LTGs 09/28/16   Time 8   Period Weeks   Status On-going  07/28/16:  met with current HEP, but would benefit from updates     OT LONG TERM GOAL #2   Title Pt will be able to write name with at least 95% legibility and simple sentence with at least 75% legibility.   Time 8   Period Weeks   Status Achieved  07/30/16:  metl (name and sentence with 100% legibility)     OT LONG TERM GOAL #3   Title Pt will demo at least 110* R shoulder flex with min compensation in prep for functional reach.   Period Weeks   Status Revised  07/28/16:  100* met initial goal.       OT LONG TERM GOAL #4   Title Pt will improve functional reaching and grasp/release as shown by improving score on box and  blocks to at least 25.  Revised 07/09/16:  as pt met initial goal of improving by 10   Baseline 3 blocks   Time 8   Period Weeks   Status On-going  07/09/16  Met, and revised goal;  07/30/16  17 blocks     OT LONG TERM GOAL #5   Title Pt will be able to use dominant RUE for grooming tasks at least 75% of the time.   Time 8   Period Weeks   Status Partially Met  07/28/16  met for brushing teeth, not met for make-up (is having hair done by hairdresser 1x week but is able to brush hair)     Long Term Additional Goals   Additional Long Term Goals Yes     OT LONG TERM GOAL #6   Title Pt will perform simple home maintenance tasks mod I.   Time 8   Period Weeks   Status Revised  07/28/16  met folded clothes, washed dishes (supervision) and revised goal     OT LONG TERM GOAL #7   Title Pt will demo improved coordination for ADLs as shown by completing 9-hole peg test in 37mn or less.   Time 8   Period Weeks   Status New               Plan - 08/04/16 1448    Clinical Impression Statement Pt continues to demo improved ability to grasp/release objects, but spasticity and fatigue are limiting factors.     Rehab Potential Good   OT Frequency 2x / week   OT Duration --  9 weeks (renewal visit+8 weeks)   OT Treatment/Interventions Self-care/ADL training;Therapeutic exercise;DME and/or AE instruction;FTherapist, nutritionalTherapeutic activities;Patient/family education;Balance training;Neuromuscular education;Electrical Stimulation;Moist Heat;Energy conservation;Passive range of motion;Therapeutic exercises;Splinting;Manual Therapy;Fluidtherapy;Ultrasound;Cryotherapy;Parrafin   Plan neuro re-ed, RUE functional use (grasp/release, 2 point pinch), review full composite ext stretch    OT Home Exercise Plan Education provided:  06/05/15 Initial HEP (table slides with light wt. bearing, tracing, and flipping cards); 06/22/16 supine ball ex and thumb opposition   Consulted and Agree with Plan  of Care Patient;Family member/caregiver   Family Member Consulted husband      Patient will benefit from skilled therapeutic intervention in order to improve the following deficits and impairments:  Decreased coordination, Decreased activity tolerance, Decreased knowledge of use of DME, Decreased strength, Impaired UE functional use, Impaired tone, Decreased range of motion, Decreased balance, Decreased mobility, Difficulty walking  Visit Diagnosis: Hemiplegia and hemiparesis following cerebral infarction affecting right dominant side (HCC)  Other symptoms and signs involving the nervous system  Unsteadiness on feet  Other lack of coordination  Muscle weakness (generalized)  Other abnormalities of gait and mobility    Problem List Patient Active Problem List   Diagnosis Date Noted  . Slow transit constipation   . Acute blood loss anemia   . Adjustment disorder with depressed mood   . Cerebro[place] ([place]) ([place] 02/03/2016  . Dysarthria, post-stroke   . Dysphagia, post-stroke   . Leukocytosis   . Prediabetes   . Right hemiparesis ([place]   . Cerebral infarction due to unspecified mechanism   . Other secondary hypertension   . Overactive bladder   . Diastolic dysfunction   . Hyperlipidemia   . Essential hypertension   . Essential tremor   . Stroke (cerebrum) ([place] 02/01/2016  . Facial droop due to stroke 02/01/2016  . Essential and other specified forms of tremor 02/20/2013  . Dysphonia 02/20/2013  . Trigeminal neuralgia 02/20/2013    [facility], 3:57 PM  [address] [address]Phone: [phone]  Fax:  [phone] Name: Kelly BANFIELDMRN: [id number] of Birth: 7April 07, 1929  AL[place] Dy  Domingo Cocking, OTR/L [facility] [address] [place], [place]  [address] [phone] phone [phone] 08/04/16 3:57 PM

## 2016-08-05 ENCOUNTER — Ambulatory Visit: Payer: Medicare Other | Admitting: Occupational Therapy

## 2016-08-05 DIAGNOSIS — R278 Other lack of coordination: Secondary | ICD-10-CM

## 2016-08-05 DIAGNOSIS — R29818 Other symptoms and signs involving the nervous system: Secondary | ICD-10-CM | POA: Diagnosis not present

## 2016-08-05 DIAGNOSIS — R2689 Other abnormalities of gait and mobility: Secondary | ICD-10-CM | POA: Diagnosis not present

## 2016-08-05 DIAGNOSIS — I69351 Hemiplegia and hemiparesis following cerebral infarction affecting right dominant side: Secondary | ICD-10-CM

## 2016-08-05 DIAGNOSIS — R2681 Unsteadiness on feet: Secondary | ICD-10-CM | POA: Diagnosis not present

## 2016-08-05 DIAGNOSIS — M6281 Muscle weakness (generalized): Secondary | ICD-10-CM | POA: Diagnosis not present

## 2016-08-05 NOTE — Therapy (Signed)
[facility] [address] [place], [place], [address] Phone: [phone]   Fax:  [phone]  Occupational Therapy Treatment  Patient Details  Name: Kelly [name] MRN: [id number] Date of Birth: November 10, 1927 Referring Provider: Cecille Rubin, NP  Encounter Date: 08/05/2016      OT End of Session - 08/05/16 1416    Visit Number 17   Number of Visits 30   Date for OT Re-Evaluation 09/28/16   Authorization Type Medicare / [employer] (g-code needed)   Authorization Time Period renewal completed 07/30/16   Authorization - Visit Number 35   Authorization - Number of Visits 20   OT Start Time 1405   OT Stop Time 1445   OT Time Calculation (min) 40 min   Activity Tolerance Patient tolerated treatment well   Behavior During Therapy [facility] for tasks assessed/performed      Past Medical History:  Diagnosis Date  . Acute blood loss anemia   . Adjustment disorder with depressed mood   . CVA (cerebral vascular accident) ([place]) 02/03/2016   Linear infarct within the posterior limb of left internal capsule  . Diastolic CHF ([place])   . Dysphagia   . Dysphonia 02/20/2013  . Essential and other specified forms of tremor 02/20/2013  . HLD (hyperlipidemia)   . HTN (hypertension)   . OAB (overactive bladder)   . Osteoporosis   . Patient receiving subcutaneous heparin    For DVT prophylaxis 9/17  . Prediabetes   . Psoriasis   . Slow transit constipation   . Stroke (cerebrum) ([place]) 02/01/2016  . Trigeminal neuralgia 02/20/2013    Past Surgical History:  Procedure Laterality Date  . APPENDECTOMY  1940  . bladder tack    . CATARACT EXTRACTION Bilateral   . HEMORRHOID SURGERY  1960  . HUMERUS FRACTURE SURGERY    . LAPAROSCOPIC HYSTERECTOMY  2006  . torn rotator cuff    . WRIST FRACTURE SURGERY  2005   seconday shoulder 2001    There were no vitals filed for this visit.      Subjective Assessment - 08/05/16 1410    Pertinent  History adjustment disorder with depressed mood, diastolic CHF, osteoporosis, hx of fall, essential tremor, HTN, prediabetes, trigeminal neuralgia   Limitations fall risk   Patient Stated Goals be able to write, fold clothes, do dishes   Currently in Pain? No/denies             Reviewed prayer stretch and weightbearing through bilateral UE's in standing with gentle rock forwards and back and side-side, min v.c. In sitting, low range functional reaching to grasp/release cylinder objects with min cueing for full finger ext and occasional intermittent full composite ext stretch of wrist/fingers.  Handwriting activities with emphasis on legibility and letter size, improved performance with foam grip, therapist issued for home.               OT Education - 08/04/16 1548    Education Details Full composite finger/wrist ext stretch (prayer stretch, wt. bearing)   Person(s) Educated Patient;Spouse   Methods Explanation;Demonstration;Tactile cues;Verbal cues   Comprehension Need further instruction;Verbal cues required;Returned demonstration;Verbalized understanding          OT Short Term Goals - 07/30/16 1622      OT SHORT TERM GOAL #1   Title Pt will be independent with initial HEP--check STGs 06/30/16   Time 4   Period Weeks   Status Achieved     OT SHORT TERM GOAL #2  Title Pt will be able to write name with at least 75% legibility.   Time 4   Period Weeks   Status Achieved  06/29/16:  met approx this level or higher for name (inconsistent at times)     OT SHORT TERM GOAL #3   Title Pt will demo at least 95* R shoulder flex with min compensation in prep for functional reach.   Time 4   Period Weeks   Status Achieved  06/29/16:  met at approx this level.     OT SHORT TERM GOAL #4   Title Pt will improve functional reaching and grasp/release as shown by improving score on box and blocks by at least 5.   Baseline 3 blocks   Time 4   Period Weeks   Status Achieved   07/02/2016 21 blocks     OT SHORT TERM GOAL #5   Title Pt will be able to cut food mod I.--check updated STGs 08/29/16   Period Weeks   Status New     Additional Short Term Goals   Additional Short Term Goals Yes     OT SHORT TERM GOAL #6   Title Pt will be able to write 2-3 sentences with 100% legibility.   Time 4   Period Weeks   Status New           OT Long Term Goals - 07/30/16 1618      OT LONG TERM GOAL #1   Title Pt will be independent with updated HEP--check updated LTGs 09/28/16   Time 8   Period Weeks   Status On-going  07/28/16:  met with current HEP, but would benefit from updates     OT LONG TERM GOAL #2   Title Pt will be able to write name with at least 95% legibility and simple sentence with at least 75% legibility.   Time 8   Period Weeks   Status Achieved  07/30/16:  metl (name and sentence with 100% legibility)     OT LONG TERM GOAL #3   Title Pt will demo at least 110* R shoulder flex with min compensation in prep for functional reach.   Period Weeks   Status Revised  07/28/16:  100* met initial goal.       OT LONG TERM GOAL #4   Title Pt will improve functional reaching and grasp/release as shown by improving score on box and blocks to at least 25.  Revised 07/09/16:  as pt met initial goal of improving by 10   Baseline 3 blocks   Time 8   Period Weeks   Status On-going  07/09/16  Met, and revised goal;  07/30/16  17 blocks     OT LONG TERM GOAL #5   Title Pt will be able to use dominant RUE for grooming tasks at least 75% of the time.   Time 8   Period Weeks   Status Partially Met  07/28/16  met for brushing teeth, not met for make-up (is having hair done by hairdresser 1x week but is able to brush hair)     Long Term Additional Goals   Additional Long Term Goals Yes     OT LONG TERM GOAL #6   Title Pt will perform simple home maintenance tasks mod I.   Time 8   Period Weeks   Status Revised  07/28/16  met folded clothes, washed dishes  (supervision) and revised goal     OT LONG TERM GOAL #7  Title Pt will demo improved coordination for ADLs as shown by completing 9-hole peg test in 44mn or less.   Time 8   Period Weeks   Status New               Plan - 08/05/16 1646    Clinical Impression Statement Pt is progressing towards goals. Pt reports improved ability to use RUE functionally at home.   Rehab Potential Good   OT Frequency 2x / week   OT Duration --  9 weeks   OT Treatment/Interventions Self-care/ADL training;Therapeutic exercise;DME and/or AE instruction;FTherapist, nutritionalTherapeutic activities;Patient/family education;Balance training;Neuromuscular education;Electrical Stimulation;Moist Heat;Energy conservation;Passive range of motion;Therapeutic exercises;Splinting;Manual Therapy;Fluidtherapy;Ultrasound;Cryotherapy;Parrafin   Plan continue to address RUE functional use   OT Home Exercise Plan Education provided:  06/05/15 Initial HEP (table slides with light wt. bearing, tracing, and flipping cards); 06/22/16 supine ball ex and thumb opposition   Consulted and Agree with Plan of Care Patient;Family member/caregiver   Family Member Consulted husband      Patient will benefit from skilled therapeutic intervention in order to improve the following deficits and impairments:  Decreased coordination, Decreased activity tolerance, Decreased knowledge of use of DME, Decreased strength, Impaired UE functional use, Impaired tone, Decreased range of motion, Decreased balance, Decreased mobility, Difficulty walking  Visit Diagnosis: Hemiplegia and hemiparesis following cerebral infarction affecting right dominant side (HCC)  Other symptoms and signs involving the nervous system  Muscle weakness (generalized)  Other lack of coordination    Problem List Patient Active Problem List   Diagnosis Date Noted  . Slow transit constipation   . Acute blood loss anemia   . Adjustment disorder with  depressed mood   . Cerebrovascular accident (CVA) ([place] 02/03/2016  . Dysarthria, post-stroke   . Dysphagia, post-stroke   . Leukocytosis   . Prediabetes   . Right hemiparesis ([place]   . Cerebral infarction due to unspecified mechanism   . Other secondary hypertension   . Overactive bladder   . Diastolic dysfunction   . Hyperlipidemia   . Essential hypertension   . Essential tremor   . Stroke (cerebrum) ([place] 02/01/2016  . Facial droop due to stroke 02/01/2016  . Essential and other specified forms of tremor 02/20/2013  . Dysphonia 02/20/2013  . Trigeminal neuralgia 02/20/2013    RINE,KATHRYN 08/05/2016, 4:49 PM  [id number]SBarton N[place] [address]Phone: [phone]  Fax:  [phone] Name: BSYAN CULLIMOREMRN: [id number] of Birth: [birth year]

## 2016-08-06 ENCOUNTER — Ambulatory Visit (INDEPENDENT_AMBULATORY_CARE_PROVIDER_SITE_OTHER): Payer: Medicare Other | Admitting: Nurse Practitioner

## 2016-08-06 ENCOUNTER — Encounter: Payer: Self-pay | Admitting: Nurse Practitioner

## 2016-08-06 VITALS — BP 102/58 | HR 84 | Ht 60.0 in | Wt 106.4 lb

## 2016-08-06 DIAGNOSIS — I69398 Other sequelae of cerebral infarction: Secondary | ICD-10-CM | POA: Diagnosis not present

## 2016-08-06 DIAGNOSIS — I63312 Cerebral infarction due to thrombosis of left middle cerebral artery: Secondary | ICD-10-CM | POA: Diagnosis not present

## 2016-08-06 DIAGNOSIS — E785 Hyperlipidemia, unspecified: Secondary | ICD-10-CM

## 2016-08-06 DIAGNOSIS — IMO0002 Reserved for concepts with insufficient information to code with codable children: Secondary | ICD-10-CM

## 2016-08-06 DIAGNOSIS — I69392 Facial weakness following cerebral infarction: Secondary | ICD-10-CM | POA: Diagnosis not present

## 2016-08-06 DIAGNOSIS — I10 Essential (primary) hypertension: Secondary | ICD-10-CM | POA: Diagnosis not present

## 2016-08-06 DIAGNOSIS — R531 Weakness: Secondary | ICD-10-CM | POA: Diagnosis not present

## 2016-08-06 NOTE — Progress Notes (Signed)
[facility]  PATIENT: Kelly [name] DOB: 01-16-1928   REASON FOR VISIT: Follow-up for stroke  HISTORY FROM: Patient and husband    HISTORY OF PRESENT ILLNESS:UPDATE 03/15/2018CM [name], 83 year old female returns for follow-up with history of stroke 02/01/2016. She is currently on aspirin 325 daily without further stroke or TIA symptoms. She has some mild bruising but no bleeding. In addition she is on Lipitor for hyperlipidemia, she denies any myalgias. She continues to have some weakness on the right side upper and lower extremities. She is currently getting physical therapy and occupational therapy 2 times a week. She has been pleased with her response to therapy. She is ambulating with a walker today she denies any recent falls. She returns for reevaluation   HISTORY 04/29/16 Kelly [name] a 83 y.o.femalewho awoke this morning normal at 6 AM. She then began feeling unwell half hour or so later lay back down awakening at 7:30 noticing right facial weakness. She states that she also noticed slurring. She is not sure if she notices much weakness on her right side, but notes that she has chronic problems with both legs, more so since her recent fall.She has significant right facial droop, right upper extremity weakness. MRI of the brain Acute nonhemorrhagic 1.4 cm linear infarct within the posterior limb of the left internal capsule. This corresponds with the patient's right-sided symptoms and a aphasia. Advanced atrophy and diffuse white matter disease. CTA of the head negative for large vessel occlusion. CTA of the neck negative. LDL 107 hemoglobin A1c 5.7. Echocardiogram was technically difficult but showed no signs of thromboembolic source. She was not on anti thrombotic prior to admission. She returns to the stroke clinic today for follow-up. She remains on aspirin 325 daily for secondary stroke prevention without further stroke or TIA symptoms. She has no bruising  and bleeding .In addition she is on Lipitor 40 mg daily without complaints of myalgias, she is on Topamax 25 mg twice daily for tremor. She is currently getting home physical therapy [facility]. She returns for reevaluation   REVIEW OF SYSTEMS: Full 14 system review of systems performed and notable only for those listed, all others are neg:  Constitutional: neg  Cardiovascular: neg Ear/Nose/Throat: neg  Skin: neg Eyes: neg Respiratory: neg Gastroitestinal: neg  Hematology/Lymphatic: neg  Endocrine: neg MusculoskeletalWalking difficulty/ Allergy/Immunology: neg Neurological: Essential tremor, weakness Psychiatric: neg Sleep : neg   ALLERGIES: No Known Allergies  HOME MEDICATIONS: Outpatient Medications Prior to Visit  Medication Sig Dispense Refill  . aspirin 325 MG tablet Take 1 tablet (325 mg total) by mouth daily.    Marland Kitchen. atorvastatin (LIPITOR) 40 MG tablet Take 1 tablet (40 mg total) by mouth daily at 6 PM.    . FLUoxetine (PROZAC) 20 MG tablet Take 1 tablet (20 mg total) by mouth daily. 30 tablet 2  . oxybutynin (DITROPAN-XL) 10 MG 24 hr tablet Take 10 mg by mouth at bedtime.    . topiramate (TOPAMAX) 50 MG tablet Take 50 mg by mouth 2 (two) times daily.     No facility-administered medications prior to visit.     PAST MEDICAL HISTORY: Past Medical History:  Diagnosis Date  . Acute blood loss anemia   . Adjustment disorder with depressed mood   . CVA (cerebral vascular accident) (HCC) 02/03/2016   Linear infarct within the posterior limb of left internal capsule  . Diastolic CHF (HCC)   . Dysphagia   .  Dysphonia 02/20/2013  . Essential and other specified forms of tremor 02/20/2013  . HLD (hyperlipidemia)   . HTN (hypertension)   . OAB (overactive bladder)   . Osteoporosis   . Patient receiving subcutaneous heparin    For DVT prophylaxis 9/17  . Prediabetes   . Psoriasis   . Slow transit constipation   . Stroke  (cerebrum) (HCC) 02/01/2016  . Trigeminal neuralgia 02/20/2013    PAST SURGICAL HISTORY: Past Surgical History:  Procedure Laterality Date  . APPENDECTOMY  1940  . bladder tack    . CATARACT EXTRACTION Bilateral   . HEMORRHOID SURGERY  1960  . HUMERUS FRACTURE SURGERY    . LAPAROSCOPIC HYSTERECTOMY  2006  . torn rotator cuff    . WRIST FRACTURE SURGERY  2005   seconday shoulder 2001    FAMILY HISTORY: Family History  Problem Relation Age of Onset  . Arthritis Brother     spine  . Osteoporosis Maternal Grandmother     SOCIAL HISTORY: Social History   Social History  . Marital status: Married    Spouse name: N/A  . Number of children: 3  . Years of education: college   Occupational History  . real estate     retired   Social History Main Topics  . Smoking status: Never Smoker  . Smokeless tobacco: Never Used  . Alcohol use Yes     Comment: Wine 2 glass daily  . Drug use: No  . Sexual activity: Not on file   Other Topics Concern  . Not on file   Social History Narrative   Lives at home with her husband.   Right-handed.   No caffeine use.     PHYSICAL EXAM  Vitals:   08/06/16 1339  BP: (!) 102/58  Pulse: 72  Weight: 106 lb 6.4 oz (48.3 kg)  Height: 5' (1.524 m)   Body mass index is 20.78 kg/m.  Generalized: Well developed, in no acute distress  Head: normocephalic and atraumatic,. Oropharynx benign  Neck: Supple, no carotid bruits  Cardiac: Regular rate rhythm, no murmur  Musculoskeletal: No deformity   Neurological examination   Mentation: Alert oriented to time, place, history taking. Attention span and concentration appropriate. Recent and remote memory intact.  Follows all commands speech With mild dysarthria .   Cranial nerve II-XII: .Pupils were equal round reactive to light extraocular movements were full, visual field were full on confrontational test. Mild right facial droop .hearing was intact to finger rubbing bilaterally. Uvula  tongue midline. head turning and shoulder shrug were normal and symmetric.Tongue protrusion to the right. Motor: normal bulk and tone, full strength in the BUE, BLE, on the left, right upper extremity 4 out of 5 right lower extremity 4 out of 5  Sensory: normal and symmetric to light touch, pinprick, and  Vibration, in the upper and lower extremities Coordination: finger-nose-finger, heel-to-shin bilaterally, no dysmetria, mild essential tremor left greater than right  Reflexes: 1+ upper lower and symmetric plantar responses were flexor bilaterally. Gait and Station:arises from the chair with pushoff, ambulates 30 feet in the hall with walker, no difficulty with turns, slow steady gait DIAGNOSTIC DATA (LABS, IMAGING, TESTING) - I reviewed patient records, labs, notes, testing and imaging myself where available.  Lab Results  Component Value Date   WBC 8.3 02/25/2016   HGB 11.8 (A) 02/25/2016   HCT 36 02/25/2016   MCV 91.0 02/13/2016   PLT 205 02/25/2016      Component Value Date/Time  NA 139 02/13/2016 0527   K 3.9 02/13/2016 0527   CL 108 02/13/2016 0527   CO2 25 02/13/2016 0527   GLUCOSE 94 02/13/2016 0527   BUN 16 02/13/2016 0527   CREATININE 0.73 02/13/2016 0527   CALCIUM 8.9 02/13/2016 0527   PROT 6.5 02/04/2016 0713   ALBUMIN 3.5 02/04/2016 0713   AST 19 02/04/2016 0713   ALT 14 02/04/2016 0713   ALKPHOS 74 02/04/2016 0713   BILITOT 0.5 02/04/2016 0713   GFRNONAA >60 02/13/2016 0527   GFRAA >60 02/13/2016 0527   Lab Results  Component Value Date   CHOL 162 02/02/2016   HDL 40 (L) 02/02/2016   LDLCALC 107 (H) 02/02/2016   TRIG 75 02/02/2016   CHOLHDL 4.1 02/02/2016   Lab Results  Component Value Date   HGBA1C 5.7 (H) 02/02/2016       ASSESSMENT AND PLAN  83 y.o. year old female  has a past medical history of  Essential and other specified forms of tremor (02/20/2013); HLD (hyperlipidemia); HTN (hypertension);  Stroke (cerebrum) (HCC) (02/01/2016);  here to  follow-up for her stroke.The patient is a current patient of Dr. Roda Shutters  who is out of the office today . This note is sent to the work in doctor.     PLAN: Stressed the importance of continued management of risk factors to prevent further stroke Continue aspirin for secondary stroke prevention Maintain strict control of hypertension with blood pressure goal below 130/90, today's reading 102/58 Your blood pressure has been low in the last 2 visits, I would recommend that you increase your fluid intake typically water or Gatorade Cholesterol with LDL cholesterol less than 96,[id number] Lipitor Continue outpatient physical therapy and occupational therapy  slowly increase , activities  eat healthy diet with whole grains,  fresh fruits and vegetables Continue  Topamax  1 twice daily  for essential tremor Follow-up in 6 months A total of 25 min  minutes was spent face-to-face with this patient. Over half this time was spent on counseling patient on the essential tremor  diagnosis and different diagnostic and therapeutic options available. Made patient aware her tremor may get worse over time it may come and go and it may be more noticeable on one side of the body. The tremor can get worse with stress fatigue caffeine consumption and extreme heat or cold. If you experience numbness tingling or pain please call our office  Nilda Riggs, [facility], [facility], APRN  [facility] [address], [address] [place], [place] [address] [phone]-

## 2016-08-06 NOTE — Patient Instructions (Signed)
Stressed the importance of continued management of risk factors to prevent further stroke Continue aspirin for secondary stroke prevention Maintain strict control of hypertension with blood pressure goal below 130/90, today's reading 102/58 Your blood pressure has been low in the last 2 visits, I would recommend that you increase your fluid intake typically water or Gatorade Cholesterol with LDL cholesterol less than 98,[id number],continue Lipitor Continue outpatient physical therapy and occupational therapy  slowly increase , activities  eat healthy diet with whole grains,  fresh fruits and vegetables Continue  Topamax  1 twice daily  for essential tremor Follow-up in 6 months

## 2016-08-07 NOTE — Progress Notes (Signed)
I have reviewed and agreed above plan. 

## 2016-08-11 ENCOUNTER — Ambulatory Visit: Payer: Medicare Other | Admitting: Occupational Therapy

## 2016-08-11 DIAGNOSIS — R278 Other lack of coordination: Secondary | ICD-10-CM | POA: Diagnosis not present

## 2016-08-11 DIAGNOSIS — R29818 Other symptoms and signs involving the nervous system: Secondary | ICD-10-CM | POA: Diagnosis not present

## 2016-08-11 DIAGNOSIS — I69351 Hemiplegia and hemiparesis following cerebral infarction affecting right dominant side: Secondary | ICD-10-CM

## 2016-08-11 DIAGNOSIS — R2681 Unsteadiness on feet: Secondary | ICD-10-CM

## 2016-08-11 DIAGNOSIS — R2689 Other abnormalities of gait and mobility: Secondary | ICD-10-CM

## 2016-08-11 DIAGNOSIS — M6281 Muscle weakness (generalized): Secondary | ICD-10-CM | POA: Diagnosis not present

## 2016-08-11 NOTE — Therapy (Signed)
[facility] [address] [place], [place], [address] Phone: [phone]   Fax:  [phone]  Occupational Therapy Treatment  Patient Details  Name: Kelly [name] MRN: [id number] Date of Birth: 1927/10/20 Referring Provider: Cecille Rubin, NP  Encounter Date: 08/11/2016      OT End of Session - 08/11/16 1324    Visit Number 18   Number of Visits 30   Date for OT Re-Evaluation 09/28/16   Authorization Type Medicare / [employer] (g-code needed)   Authorization Time Period renewal completed 07/30/16   Authorization - Visit Number 18   Authorization - Number of Visits 20   OT Start Time 9675   OT Stop Time 1403   OT Time Calculation (min) 46 min   Activity Tolerance Patient tolerated treatment well   Behavior During Therapy [facility] for tasks assessed/performed      Past Medical History:  Diagnosis Date  . Acute blood loss anemia   . Adjustment disorder with depressed mood   . CVA (cerebral vascular accident) ([place]) 02/03/2016   Linear infarct within the posterior limb of left internal capsule  . Diastolic CHF ([place])   . Dysphagia   . Dysphonia 02/20/2013  . Essential and other specified forms of tremor 02/20/2013  . HLD (hyperlipidemia)   . HTN (hypertension)   . OAB (overactive bladder)   . Osteoporosis   . Patient receiving subcutaneous heparin    For DVT prophylaxis 9/17  . Prediabetes   . Psoriasis   . Slow transit constipation   . Stroke (cerebrum) ([place]) 02/01/2016  . Trigeminal neuralgia 02/20/2013    Past Surgical History:  Procedure Laterality Date  . APPENDECTOMY  1940  . bladder tack    . CATARACT EXTRACTION Bilateral   . HEMORRHOID SURGERY  1960  . HUMERUS FRACTURE SURGERY    . LAPAROSCOPIC HYSTERECTOMY  2006  . torn rotator cuff    . WRIST FRACTURE SURGERY  2005   seconday shoulder 2001    There were no vitals filed for this visit.      Subjective Assessment - 08/11/16 1322    Subjective   Had a nose bleed for 2 nights, but better with humidifier   Pertinent History adjustment disorder with depressed mood, diastolic CHF, osteoporosis, hx of fall, essential tremor, HTN, prediabetes, trigeminal neuralgia   Limitations fall risk   Patient Stated Goals be able to write, fold clothes, do dishes   Currently in Pain? No/denies               In supine, AAROM BUEs shoulder flex and chest press and in diagonal patterns to each side with ball with min v.c. For finger/elbow ext  In sitting, low range functional reaching to grasp/release cylinder objects with min cueing for full finger ext and occasional intermittent full composite ext stretch of wrist/fingers.  Then mid range reaching to release objects with min-mod cueing for elbow ext.    In sitting, flipping large cards (low range) with focus on ER, supination, and finger extension with min cueing.  In sitting, wt. Bearing through R hand with  Body on arm movements and min cues for incr elbow ext and posture for decr tone.    Arm bike x31mn level 1 for reciprocal movement with min cueing initially, no rest needed.  Recommended tabletop UBE for exercise at home (can also use for LEs).  Pt/husband verbalized understanding.  OT Short Term Goals - 08/11/16 1641      OT SHORT TERM GOAL #2   Period --     OT SHORT TERM GOAL #3   Status --     OT SHORT TERM GOAL #5   Title Pt will be able to cut food mod I.--check updated STGs 08/29/16   Period Weeks   Status New     OT SHORT TERM GOAL #6   Title Pt will be able to write 2-3 sentences with 100% legibility.   Time 4   Period Weeks   Status New           OT Long Term Goals - 08/11/16 1643      OT LONG TERM GOAL #1   Title Pt will be independent with updated HEP--check updated LTGs 09/28/16   Time 8   Period Weeks   Status On-going  07/28/16:  met with current HEP, but would benefit from updates     OT LONG TERM GOAL #3   Title Pt  will demo at least 110* R shoulder flex with min compensation in prep for functional reach.   Period Weeks   Status Revised  07/28/16:  100* met initial goal.       OT LONG TERM GOAL #4   Title Pt will improve functional reaching and grasp/release as shown by improving score on box and blocks to at least 25.  Revised 07/09/16:  as pt met initial goal of improving by 10   Baseline 3 blocks   Time 8   Period Weeks   Status On-going  07/09/16  Met, and revised goal;  07/30/16  17 blocks     OT LONG TERM GOAL #6   Title Pt will perform simple home maintenance tasks mod I.   Time 8   Period Weeks   Status Revised  07/28/16  met folded clothes, washed dishes (supervision) and revised goal     OT LONG TERM GOAL #7   Title Pt will demo improved coordination for ADLs as shown by completing 9-hole peg test in 65mn or less.   Time 8   Period Weeks   Status New               Plan - 08/11/16 1640    Clinical Impression Statement Pt is progressing well with improved mid-level reach today.   Rehab Potential Good   OT Frequency 2x / week   OT Duration --  9 weeks   OT Treatment/Interventions Self-care/ADL training;Therapeutic exercise;DME and/or AE instruction;FTherapist, nutritionalTherapeutic activities;Patient/family education;Balance training;Neuromuscular education;Electrical Stimulation;Moist Heat;Energy conservation;Passive range of motion;Therapeutic exercises;Splinting;Manual Therapy;Fluidtherapy;Ultrasound;Cryotherapy;Parrafin   Plan continue with neuro re-ed, RUE functional use    OT Home Exercise Plan Education provided:  06/05/15 Initial HEP (table slides with light wt. bearing, tracing, and flipping cards); 06/22/16 supine ball ex and thumb opposition   Consulted and Agree with Plan of Care Patient;Family member/caregiver   Family Member Consulted husband      Patient will benefit from skilled therapeutic intervention in order to improve the following deficits and  impairments:  Decreased coordination, Decreased activity tolerance, Decreased knowledge of use of DME, Decreased strength, Impaired UE functional use, Impaired tone, Decreased range of motion, Decreased balance, Decreased mobility, Difficulty walking  Visit Diagnosis: Hemiplegia and hemiparesis following cerebral infarction affecting right dominant side (HCC)  Other symptoms and signs involving the nervous system  Muscle weakness (generalized)  Other lack of coordination  Unsteadiness on feet  Other abnormalities of gait  and mobility    Problem List Patient Active Problem List   Diagnosis Date Noted  . Slow transit constipation   . Acute blood loss anemia   . Adjustment disorder with depressed mood   . Cerebrovascular accident (CVA) ([place]) 02/03/2016  . Dysarthria, post-stroke   . Dysphagia, post-stroke   . Leukocytosis   . Prediabetes   . Right hemiparesis ([place])   . Cerebral infarction due to unspecified mechanism   . Other secondary hypertension   . Overactive bladder   . Diastolic dysfunction   . Hyperlipidemia   . Essential hypertension   . Essential tremor   . Stroke (cerebrum) ([place]) 02/01/2016  . Facial droop due to stroke 02/01/2016  . Essential and other specified forms of tremor 02/20/2013  . Dysphonia 02/20/2013  . Trigeminal neuralgia 02/20/2013    [facility] 08/11/2016, 4:48 PM  [facility] [address] [place], [place], [address] Phone: [phone]   Fax:  [phone]  Name: DANAHI REDDISH MRN: [id number] Date of Birth: [birth year]   Vianne Bulls, OTR/L [facility] [address] [place],  [place]  [address] [phone] phone [phone] 08/11/16 4:48 PM

## 2016-08-14 ENCOUNTER — Encounter: Payer: Self-pay | Admitting: Physical Therapy

## 2016-08-14 ENCOUNTER — Telehealth: Payer: Self-pay | Admitting: Physical Therapy

## 2016-08-14 ENCOUNTER — Ambulatory Visit: Payer: Medicare Other | Admitting: Physical Therapy

## 2016-08-14 DIAGNOSIS — M6281 Muscle weakness (generalized): Secondary | ICD-10-CM | POA: Diagnosis not present

## 2016-08-14 DIAGNOSIS — R2681 Unsteadiness on feet: Secondary | ICD-10-CM | POA: Diagnosis not present

## 2016-08-14 DIAGNOSIS — R278 Other lack of coordination: Secondary | ICD-10-CM | POA: Diagnosis not present

## 2016-08-14 DIAGNOSIS — I639 Cerebral infarction, unspecified: Secondary | ICD-10-CM

## 2016-08-14 DIAGNOSIS — R2689 Other abnormalities of gait and mobility: Secondary | ICD-10-CM

## 2016-08-14 DIAGNOSIS — R29818 Other symptoms and signs involving the nervous system: Secondary | ICD-10-CM | POA: Diagnosis not present

## 2016-08-14 DIAGNOSIS — I69351 Hemiplegia and hemiparesis following cerebral infarction affecting right dominant side: Secondary | ICD-10-CM | POA: Diagnosis not present

## 2016-08-14 NOTE — Patient Instructions (Addendum)
Scapular Retraction: Rowing (Eccentric) - Arms - Side (Resistance Band)    Hold end of band in each hand. Pull back until elbows are even with trunk. Keep elbows by sides, thumbs up. Slowly release for 3-5 seconds. Use ____red____ resistance band. _10_ reps per set, 1___ sets per day, _5__ days per week.   http://ecce.exer.us/227   Copyright  VHI. All rights reserved.

## 2016-08-14 NOTE — Telephone Encounter (Signed)
Kelly RiggsNancy Carolyn Martin, NP,  Kelly [name] is being treated by physical therapy for post cerebral infarct.. She will benefit from use of leather toe cap on her right shoe in order to improve safety with functional mobility.    If you agree, please submit request in EPIC under referral for DME (list leather toe cap in comments) or fax to [facility] at [phone].   Thank you,  Kelly KusterKathy Tayquan [name], PTA, [facility] [address], [address] [place], [place] [address] [phone] 08/14/16, 7:00 PM   [facility] [address] [place], [place]  [address] Phone:  [phone] Fax:  [phone]

## 2016-08-14 NOTE — Therapy (Signed)
[facility] [address] [place], [place], [address] Phone: [phone]   Fax:  [phone]  Physical Therapy Treatment  Patient Details  Name: Kelly [name] MRN: [id number] Date of Birth: 07-Aug-1927 Referring Provider: Cecille Rubin  Encounter Date: 08/14/2016      PT End of Session - 08/14/16 1538    Visit Number 16   Number of Visits 34  10+62 (re-cert 3/8   Date for PT Re-Evaluation 09/25/16   Authorization Type Medicare/[employer]-[place] every 10th visit   Authorization Time Period 07/30/16- 09/25/16   PT Start Time 1534   PT Stop Time 1615   PT Time Calculation (min) 41 min   Equipment Utilized During Treatment Gait belt   Activity Tolerance Patient tolerated treatment well   Behavior During Therapy [facility] for tasks assessed/performed      Past Medical History:  Diagnosis Date  . Acute blood loss anemia   . Adjustment disorder with depressed mood   . CVA (cerebral vascular accident) ([place]) 02/03/2016   Linear infarct within the posterior limb of left internal capsule  . Diastolic CHF ([place])   . Dysphagia   . Dysphonia 02/20/2013  . Essential and other specified forms of tremor 02/20/2013  . HLD (hyperlipidemia)   . HTN (hypertension)   . OAB (overactive bladder)   . Osteoporosis   . Patient receiving subcutaneous heparin    For DVT prophylaxis 9/17  . Prediabetes   . Psoriasis   . Slow transit constipation   . Stroke (cerebrum) ([place]) 02/01/2016  . Trigeminal neuralgia 02/20/2013    Past Surgical History:  Procedure Laterality Date  . APPENDECTOMY  1940  . bladder tack    . CATARACT EXTRACTION Bilateral   . HEMORRHOID SURGERY  1960  . HUMERUS FRACTURE SURGERY    . LAPAROSCOPIC HYSTERECTOMY  2006  . torn rotator cuff    . WRIST FRACTURE SURGERY  2005   seconday shoulder 2001    There were no vitals filed for this visit.      Subjective Assessment - 08/14/16 1537    Subjective No new  complaints. No pain or falls to report. States the HEP is going well.    Patient is accompained by: Family member  spouse   Patient Stated Goals Pt's goal for therapy is to walk again.   Currently in Pain? No/denies            [facility] Adult PT Treatment/Exercise - 08/14/16 1542      Transfers   Transfers Sit to Stand;Stand to Sit   Sit to Stand 5: Supervision;With upper extremity assist;From chair/3-in-1;From bed   Sit to Stand Details Verbal cues for technique;Verbal cues for safe use of DME/AE   Sit to Stand Details (indicate cue type and reason) increased time needed with cues to scoot to edge to assist with standing up   Stand to Sit 5: Supervision;With upper extremity assist;To bed;To chair/3-in-1;Uncontrolled descent   Stand to Sit Details (indicate cue type and reason) Verbal cues for sequencing;Verbal cues for precautions/safety;Verbal cues for safe use of DME/AE     Ambulation/Gait   Ambulation/Gait Yes   Ambulation/Gait Assistance 4: Min guard;5: Supervision   Ambulation/Gait Assistance Details used 1/2 inch heel wedge and simulated toe cap on right shoe with gait. moderate cues for upright posture/to look forward vs at toes with gait.   Ambulation Distance (Feet) 450 Feet   Assistive device Rolling walker   Gait Pattern Step-through pattern;Decreased stride length  High Level Balance   High Level Balance Activities Side stepping;Backward walking   High Level Balance Comments at counter top: bil UE support with side stepping with cues on posture/ex form; single UE on counter with contralateral HHA for backwards walking x 4 laps with cues for posture and ex form.      Neuro Re-ed    Neuro Re-ed Details  standing on airex with feet apart: EC no head movements 20 sec's x 3 reps, no UE support with up to min assist for balance; EO head movements left<>right and up<>down slowly,  with fingertip support and up to min assist for balance.      Exercises   Other Exercises   seated edge of mat: with red theraband pt performed rows x 10 reps with cues on form (added to HEP); pt then performed shoulder extension x 10 reps with cues on form, was difficult for pt's right UE, therefore did not add this one to HEP.                                   PT Education - 08/14/16 1906    Education provided Yes   Education Details HEP: theraband rows for postural strengthening   Person(s) Educated Patient;Spouse   Methods Explanation;Demonstration;Verbal cues;Handout   Comprehension Verbalized understanding;Returned demonstration;Verbal cues required;Need further instruction          PT Short Term Goals - 08/14/16 1912      PT SHORT TERM GOAL #1   Title Pt will perform HEP with family's supervision, to address balance, gait, transfers and strengthening.  TARGET 07/01/16 (NEW TARGET DATE FOR ALL CURRENT STGs 08/30/16)   Baseline 06/29/16: met per pt and spouse report   Status Achieved     PT SHORT TERM GOAL #2   Title Pt will improve Berg Balance score to at least 26/56 for decreased fall risk.   Baseline 06/29/16: 30/56 scored today   Status Achieved     PT SHORT TERM GOAL #3   Title Pt will improve TUG score to less than or equal to 50 seconds for decreased fall risk.   Baseline 06/29/16: 23.81 with RW   Status Achieved     PT SHORT TERM GOAL #4   Title Pt will transfer sit<>stand, modified independently, at least 4 of 5 trials, to demo improved lower extremity strength and transfer efficiency.   Baseline 06/29/16: pt at supervision level with cues for safety/sequencing needed at times   Status Partially Met     PT SHORT TERM GOAL #5   Title Pt will ambulate at least 200 ft using RW, supervision, for improved gait safety and efficiency.   Baseline 06/29/16: met today with RW   Status Achieved     PT SHORT TERM GOAL #6   Title Pt/family will verbalize understanding of CVA education.   Time 4   Period Weeks   Status On-going           PT Long Term Goals -  08/14/16 1913      PT LONG TERM GOAL #1   Title Pt/family will verbalize understanding of fall prevention in the home environment.  TARGET 07/31/16 (NEW TARGET DATE FOR ALL CURRENT LTGS 09/25/16)   Time 8   Period Weeks   Status On-going     PT LONG TERM GOAL #2   Title Pt will improve Berg Balance score to at least 31/56 for decreased  fall risk.   Time 8   Period Weeks   Status On-going     PT LONG TERM GOAL #3   Title Pt will improve TUG score to less than or equal to 45 seconds for decreased fall risk.   Baseline 33.41 sec 07/28/16   Time 8   Period Weeks   Status Achieved     PT LONG TERM GOAL #4   Title Pt will improve gait velocity to at least 1.8 ft/sec for improved gait efficiency and safety.   Baseline 1.56 ft/sec with RW 07/28/16   Time 8   Period Weeks   Status Achieved     PT LONG TERM GOAL #5   Title Pt will ambulate at least 800 ft indoor and outdoor surfaces, using RW, modified independently, for improved outdoor and community.   Time 8   Period Weeks   Status On-going     PT LONG TERM GOAL #6   Title Pt will improve Neuro QOL score on FOTO by at least 20% for improved functional mobility.   Time 8   Period Weeks   Status On-going            Plan - 08/14/16 1538    Clinical Impression Statement today's skilled session continued to focus on gait with heel wedge/simulated toe cap on right foot with no toe catching noted with gait. Pt continues to have occasional knee genu recurvatum depite heel wedge, however this can be decreased/prevented with proper hip positioning and weight shifting in stance. Remainder of session addressed balance and postural muscle strengthening. No issues reported. Sent home one theraband exercise to work on postural strengthening today. Also sent telephone inbasket message to referring provider requesting the order for the toe cap. Pt is making steady progress toward goals and should benefit from continued PT to progress toward unmet  goals.                      Rehab Potential Good   PT Frequency 2x / week   PT Duration 8 weeks  updated 3/8   PT Treatment/Interventions ADLs/Self Care Home Management;Functional mobility training;Stair training;Gait training;Patient/family education;Neuromuscular re-education;Balance training;Therapeutic exercise;Therapeutic activities;DME Instruction   PT Next Visit Plan continue to work on gait with heel wedge and simualted toe cap until pt can have shoe modificaiton made; continue to work on transfers from soft/lower surfaces and continue to work on standing balance                      PT Home Exercise Plan son Zenia Resides Vaden, PT) gave standing ex's at counter; pelvic tilts, bridging, standing bil ankle DF stretch w/ 2" block, standing knee flexion, hip abdct; seated knee flexion with tband, backward walking   Consulted and Agree with Plan of Care Patient;Family member/caregiver   Family Member Consulted Husband      Patient will benefit from skilled therapeutic intervention in order to improve the following deficits and impairments:  Abnormal gait, Decreased balance, Decreased cognition, Decreased knowledge of use of DME, Decreased range of motion, Decreased safety awareness, Decreased strength  Visit Diagnosis: Hemiplegia and hemiparesis following cerebral infarction affecting right dominant side (HCC)  Muscle weakness (generalized)  Unsteadiness on feet  Other abnormalities of gait and mobility     Problem List Patient Active Problem List   Diagnosis Date Noted  . Slow transit constipation   . Acute blood loss anemia   . Adjustment disorder with depressed mood   .  Cerebrovascular accident (CVA) ([place]) 02/03/2016  . Dysarthria, post-stroke   . Dysphagia, post-stroke   . Leukocytosis   . Prediabetes   . Right hemiparesis ([place])   . Cerebral infarction due to unspecified mechanism   . Other secondary hypertension   . Overactive bladder   . Diastolic dysfunction   .  Hyperlipidemia   . Essential hypertension   . Essential tremor   . Stroke (cerebrum) ([place]) 02/01/2016  . Facial droop due to stroke 02/01/2016  . Essential and other specified forms of tremor 02/20/2013  . Dysphonia 02/20/2013  . Trigeminal neuralgia 02/20/2013    Willow Ora, PTA, [facility] [address] [place], [place] [address] [phone] 08/14/16, 7:13 PM   Name: Kelly [name] MRN: [id number] Date of Birth: Jun 21, 1927

## 2016-08-17 ENCOUNTER — Ambulatory Visit: Payer: Medicare Other

## 2016-08-17 DIAGNOSIS — R2681 Unsteadiness on feet: Secondary | ICD-10-CM | POA: Diagnosis not present

## 2016-08-17 DIAGNOSIS — R2689 Other abnormalities of gait and mobility: Secondary | ICD-10-CM | POA: Diagnosis not present

## 2016-08-17 DIAGNOSIS — R29818 Other symptoms and signs involving the nervous system: Secondary | ICD-10-CM | POA: Diagnosis not present

## 2016-08-17 DIAGNOSIS — R278 Other lack of coordination: Secondary | ICD-10-CM | POA: Diagnosis not present

## 2016-08-17 DIAGNOSIS — I69351 Hemiplegia and hemiparesis following cerebral infarction affecting right dominant side: Secondary | ICD-10-CM

## 2016-08-17 DIAGNOSIS — M6281 Muscle weakness (generalized): Secondary | ICD-10-CM

## 2016-08-17 NOTE — Therapy (Signed)
[facility] [address] [place], [place], [address] Phone: [phone]   Fax:  [phone]  Physical Therapy Treatment  Patient Details  Name: Kelly [name] MRN: [id number] Date of Birth: 05/17/1928 Referring Provider: Martin, Carolyn  Encounter Date: 08/17/2016      PT End of Session - 08/17/16 1535    Visit Number 17   Number of Visits 36   Date for PT Re-Evaluation 09/25/16   Authorization Type Medicare/[employer]-G[employer] every 10th visit   Authorization Time Period 07/30/16- 09/25/16   PT Start Time 1446   PT Stop Time 1528   PT Time Calculation (min) 42 min   Equipment Utilized During Treatment --  assist and S prn   Activity Tolerance Patient tolerated treatment well   Behavior During Therapy WFL for tasks assessed/performed      Past Medical History:  Diagnosis Date  . Acute blood loss anemia   . Adjustment disorder with depressed mood   . CVA (cerebral vascular accident) ([facility]) 02/03/2016   Linear infarct within the posterior limb of left internal capsule  . Diastolic CHF ([facility])   . Dysphagia   . Dysphonia 02/20/2013  . Essential and other specified forms of tremor 02/20/2013  . HLD (hyperlipidemia)   . HTN (hypertension)   . OAB (overactive bladder)   . Osteoporosis   . Patient receiving subcutaneous heparin    For DVT prophylaxis 9/17  . Prediabetes   . Psoriasis   . Slow transit constipation   . Stroke (cerebrum) ([facility]) 02/01/2016  . Trigeminal neuralgia 02/20/2013    Past Surgical History:  Procedure Laterality Date  . APPENDECTOMY  1940  . bladder tack    . CATARACT EXTRACTION Bilateral   . HEMORRHOID SURGERY  1960  . HUMERUS FRACTURE SURGERY    . LAPAROSCOPIC HYSTERECTOMY  2006  . torn rotator cuff    . WRIST FRACTURE SURGERY  2005   seconday shoulder 2001    There were no vitals filed for this visit.      Subjective Assessment - 08/17/16 1454    Subjective Pt reported she fell on  Saturday, when walking around the pool table. She's not sure why she fell, but went down slowly and hit R elbow.    Patient is accompained by: Family member  Kelly [name]: husband   Patient Stated Goals Pt's goal for therapy is to walk again.   Currently in Pain? No/denies                         [facility] Adult PT Treatment/Exercise - 08/17/16 1529      Transfers   Transfers Sit to Stand;Stand to Sit  from mat, lower surface c cushion, mat with feet on airex   Sit to Stand 3: Mod assist;4: Min assist;5: Supervision;With upper extremity assist;From chair/3-in-1  and lower surface   Sit to Stand Details Verbal cues for technique;Verbal cues for safe use of DME/AE   Sit to Stand Details (indicate cue type and reason) Pt required min-mod A during sit to stand from lower surfaces with thick cushion. Performed with RW from lower surfaces and cues/demo for technique. x5 reps from each surfaces. Pt hesitant due to fear of lower surfaces.   Stand to Sit 5: Supervision;With upper extremity assist;To bed;To chair/3-in-1;Uncontrolled descent  and lower surface   Stand to Sit Details (indicate cue type and reason) Verbal cues for sequencing;Verbal cues for precautions/safety;Verbal cues for safe use of DME/AE       Ambulation/Gait   Ambulation/Gait Yes   Ambulation/Gait Assistance 4: Min guard;5: Supervision   Ambulation/Gait Assistance Details Pt has 1/2" heel wedge placed in R shoe. cues to improve heel strike, stride length and upright posture. Cues to make sure pt kept R and [name] side of RW equal, as pt has tendency to lead with stronger ([name]) side. Pt required two seated rest breaks 2/2 fatigue. Min guard during R toes catching to ensure safety.   Ambulation Distance (Feet) 230 Feet  x2   Assistive device Rolling walker   Gait Pattern Step-through pattern;Decreased stride length;Decreased dorsiflexion - right             Balance Exercises - 08/17/16 1532      Balance Exercises: Standing    Standing Eyes Opened Wide (BOA);Foam/compliant surface;3 reps;10 secs;30 secs   Other Standing Exercises Performed with S to ensure safety and cues for upright posture.            PT Education - 08/17/16 1534    Education provided Yes   Education Details PT educated pt on the importance of upright posture during all activities. PT informed pt that NP approved leather shoe cap, and will check with primary PT as pt was not sure who they were going to get cap from. PT encouraged pt to see MD regarding R elbow hematoma after fall.   Person(s) Educated Patient;Spouse   Methods Explanation   Comprehension Verbalized understanding          PT Short Term Goals - 08/14/16 1912      PT SHORT TERM GOAL #1   Title Pt will perform HEP with family's supervision, to address balance, gait, transfers and strengthening.  TARGET 07/01/16 (NEW TARGET DATE FOR ALL CURRENT STGs 08/30/16)   Baseline 06/29/16: met per pt and spouse report   Status Achieved     PT SHORT TERM GOAL #2   Title Pt will improve Berg Balance score to at least 26/56 for decreased fall risk.   Baseline 06/29/16: 30/56 scored today   Status Achieved     PT SHORT TERM GOAL #3   Title Pt will improve TUG score to less than or equal to 50 seconds for decreased fall risk.   Baseline 06/29/16: 23.81 with RW   Status Achieved     PT SHORT TERM GOAL #4   Title Pt will transfer sit<>stand, modified independently, at least 4 of 5 trials, to demo improved lower extremity strength and transfer efficiency.   Baseline 06/29/16: pt at supervision level with cues for safety/sequencing needed at times   Status Partially Met     PT SHORT TERM GOAL #5   Title Pt will ambulate at least 200 ft using RW, supervision, for improved gait safety and efficiency.   Baseline 06/29/16: met today with RW   Status Achieved     PT SHORT TERM GOAL #6   Title Pt/family will verbalize understanding of CVA education.   Time 4   Period Weeks   Status On-going            PT Long Term Goals - 08/14/16 1913      PT LONG TERM GOAL #1   Title Pt/family will verbalize understanding of fall prevention in the home environment.  TARGET 07/31/16 (NEW TARGET DATE FOR ALL CURRENT LTGS 09/25/16)   Time 8   Period Weeks   Status On-going     PT LONG TERM GOAL #2   Title Pt will improve Berg Balance score   to at least 31/56 for decreased fall risk.   Time 8   Period Weeks   Status On-going     PT LONG TERM GOAL #3   Title Pt will improve TUG score to less than or equal to 45 seconds for decreased fall risk.   Baseline 33.41 sec 07/28/16   Time 8   Period Weeks   Status Achieved     PT LONG TERM GOAL #4   Title Pt will improve gait velocity to at least 1.8 ft/sec for improved gait efficiency and safety.   Baseline 1.56 ft/sec with RW 07/28/16   Time 8   Period Weeks   Status Achieved     PT LONG TERM GOAL #5   Title Pt will ambulate at least 800 ft indoor and outdoor surfaces, using RW, modified independently, for improved outdoor and community.   Time 8   Period Weeks   Status On-going     PT LONG TERM GOAL #6   Title Pt will improve Neuro QOL score on FOTO by at least 20% for improved functional mobility.   Time 8   Period Weeks   Status On-going               Plan - 08/17/16 1536    Clinical Impression Statement Pt able to perform STS from mat with feet on solid surfaces with S only but required min-mod A during STS txfs from lower surface with cushion 2/2 impaired balance and strength. Pt with intermittent decr. R toe clearance during gait. PT encouraged pt to see MD re: R elbow hematoma s/p fall. Pt denied pain during palpation, hematoma approx. 2" in diameter. Continue with POC.    Rehab Potential Good   PT Frequency 2x / week   PT Duration 8 weeks  updated 3/8   PT Treatment/Interventions ADLs/Self Care Home Management;Functional mobility training;Stair training;Gait training;Patient/family education;Neuromuscular  re-education;Balance training;Therapeutic exercise;Therapeutic activities;DME Instruction   PT Next Visit Plan Continue to work on gait with heel wedge and simualted toe cap until pt can have shoe modificaiton made; continue to work on transfers from soft/lower surfaces and continue to work on standing balance                      PT Home Exercise Plan son (Allen Caine, PT) gave standing ex's at counter; pelvic tilts, bridging, standing bil ankle DF stretch w/ 2" block, standing knee flexion, hip abdct; seated knee flexion with tband, backward walking   Consulted and Agree with Plan of Care Patient;Family member/caregiver   Family Member Consulted Husband      Patient will benefit from skilled therapeutic intervention in order to improve the following deficits and impairments:  Abnormal gait, Decreased balance, Decreased cognition, Decreased knowledge of use of DME, Decreased range of motion, Decreased safety awareness, Decreased strength  Visit Diagnosis: Other abnormalities of gait and mobility  Muscle weakness (generalized)  Hemiplegia and hemiparesis following cerebral infarction affecting right dominant side (HCC)     Problem List Patient Active Problem List   Diagnosis Date Noted  . Slow transit constipation   . Acute blood loss anemia   . Adjustment disorder with depressed mood   . Cerebrovascular accident (CVA) ([facility]) 02/03/2016  . Dysarthria, post-stroke   . Dysphagia, post-stroke   . Leukocytosis   . Prediabetes   . Right hemiparesis ([facility])   . Cerebral infarction due to unspecified mechanism   . Other secondary hypertension   . Overactive bladder   .   Diastolic dysfunction   . Hyperlipidemia   . Essential hypertension   . Essential tremor   . Stroke (cerebrum) ([place]) 02/01/2016  . Facial droop due to stroke 02/01/2016  . Essential and other specified forms of tremor 02/20/2013  . Dysphonia 02/20/2013  . Trigeminal neuralgia 02/20/2013    Kelly Rayborn  [name] 08/17/2016, 3:38 PM  [facility] [address] [place], [place], [address] Phone: [phone]   Fax:  [phone]  Name: Kelly [name] MRN: [id number] Date of Birth: Sep 29, 1927  Geoffry Paradise, PT,DPT 08/17/16 3:39 PM Phone: [phone] Fax: [phone]

## 2016-08-17 NOTE — Addendum Note (Signed)
Addended by: Lynder ParentsMARTIN, Sanye Ledesma C on: 08/17/2016 02:06 PM   Modules accepted: Orders

## 2016-08-18 ENCOUNTER — Ambulatory Visit: Payer: Medicare Other | Admitting: Occupational Therapy

## 2016-08-18 ENCOUNTER — Ambulatory Visit: Payer: Medicare Other | Admitting: Physical Therapy

## 2016-08-18 ENCOUNTER — Encounter: Payer: Self-pay | Admitting: Physical Therapy

## 2016-08-18 DIAGNOSIS — R29818 Other symptoms and signs involving the nervous system: Secondary | ICD-10-CM | POA: Diagnosis not present

## 2016-08-18 DIAGNOSIS — R2689 Other abnormalities of gait and mobility: Secondary | ICD-10-CM

## 2016-08-18 DIAGNOSIS — M6281 Muscle weakness (generalized): Secondary | ICD-10-CM

## 2016-08-18 DIAGNOSIS — R2681 Unsteadiness on feet: Secondary | ICD-10-CM

## 2016-08-18 DIAGNOSIS — I69351 Hemiplegia and hemiparesis following cerebral infarction affecting right dominant side: Secondary | ICD-10-CM

## 2016-08-18 DIAGNOSIS — R278 Other lack of coordination: Secondary | ICD-10-CM

## 2016-08-18 NOTE — Therapy (Signed)
[facility] [address] [place], [place], [address] Phone: [phone]   Fax:  [phone]  Occupational Therapy Treatment  Patient Details  Name: Kelly [name] MRN: [id number] Date of Birth: 10/16/1927 Referring Provider: Cecille Rubin, NP  Encounter Date: 08/18/2016      OT End of Session - 08/18/16 1514    Visit Number 19   Number of Visits 30   Date for OT Re-Evaluation 09/28/16   Authorization Type Medicare / [employer] (g-code needed)   Authorization Time Period renewal completed 07/30/16   Authorization - Visit Number 21   Authorization - Number of Visits 20   OT Start Time 1405   OT Stop Time 1445   OT Time Calculation (min) 40 min   Activity Tolerance Patient tolerated treatment well   Behavior During Therapy [facility] for tasks assessed/performed      Past Medical History:  Diagnosis Date  . Acute blood loss anemia   . Adjustment disorder with depressed mood   . CVA (cerebral vascular accident) ([place]) 02/03/2016   Linear infarct within the posterior limb of left internal capsule  . Diastolic CHF ([place])   . Dysphagia   . Dysphonia 02/20/2013  . Essential and other specified forms of tremor 02/20/2013  . HLD (hyperlipidemia)   . HTN (hypertension)   . OAB (overactive bladder)   . Osteoporosis   . Patient receiving subcutaneous heparin    For DVT prophylaxis 9/17  . Prediabetes   . Psoriasis   . Slow transit constipation   . Stroke (cerebrum) ([place]) 02/01/2016  . Trigeminal neuralgia 02/20/2013    Past Surgical History:  Procedure Laterality Date  . APPENDECTOMY  1940  . bladder tack    . CATARACT EXTRACTION Bilateral   . HEMORRHOID SURGERY  1960  . HUMERUS FRACTURE SURGERY    . LAPAROSCOPIC HYSTERECTOMY  2006  . torn rotator cuff    . WRIST FRACTURE SURGERY  2005   seconday shoulder 2001    There were no vitals filed for this visit.      Subjective Assessment - 08/18/16 1511    Subjective   Pt reports she had a fall walking around pool table   Pertinent History adjustment disorder with depressed mood, diastolic CHF, osteoporosis, hx of fall, essential tremor, HTN, prediabetes, trigeminal neuralgia   Patient Stated Goals be able to write, fold clothes, do dishes   Currently in Pain? Yes   Pain Score 3    Pain Location Knee   Pain Orientation Right   Pain Descriptors / Indicators Aching   Pain Type Acute pain   Pain Onset In the past 7 days   Aggravating Factors  fall on right knee   Pain Relieving Factors rest   Multiple Pain Sites No           Treatment: Attempted to lower to pt's knees on mat to practice getting up and down after a fall, however pt with knee pain today and task was discontinued and therapist assisted pt back to the mat.  Seated Holding ball with bilateral UE's chest press x 10 reps, min v.c. Weightbearing edge of mat with RUE, followed by low to mid level functional reach( to play connect 4 and to grasp / release small cones with emphasis on finger ext and thumb abduction, min v.c./ faciliation Arm bike x 5 mins level 1 for conditioning  OT Short Term Goals - 08/11/16 1641      OT SHORT TERM GOAL #2   Period --     OT SHORT TERM GOAL #3   Status --     OT SHORT TERM GOAL #5   Title Pt will be able to cut food mod I.--check updated STGs 08/29/16   Period Weeks   Status New     OT SHORT TERM GOAL #6   Title Pt will be able to write 2-3 sentences with 100% legibility.   Time 4   Period Weeks   Status New           OT Long Term Goals - 08/11/16 1643      OT LONG TERM GOAL #1   Title Pt will be independent with updated HEP--check updated LTGs 09/28/16   Time 8   Period Weeks   Status On-going  07/28/16:  met with current HEP, but would benefit from updates     OT LONG TERM GOAL #3   Title Pt will demo at least 110* R shoulder flex with min compensation in prep for functional reach.   Period Weeks    Status Revised  07/28/16:  100* met initial goal.       OT LONG TERM GOAL #4   Title Pt will improve functional reaching and grasp/release as shown by improving score on box and blocks to at least 25.  Revised 07/09/16:  as pt met initial goal of improving by 10   Baseline 3 blocks   Time 8   Period Weeks   Status On-going  07/09/16  Met, and revised goal;  07/30/16  17 blocks     OT LONG TERM GOAL #6   Title Pt will perform simple home maintenance tasks mod I.   Time 8   Period Weeks   Status Revised  07/28/16  met folded clothes, washed dishes (supervision) and revised goal     OT LONG TERM GOAL #7   Title Pt will demo improved coordination for ADLs as shown by completing 9-hole peg test in 42mn or less.   Time 8   Period Weeks   Status New               Plan - 08/18/16 1512    Clinical Impression Statement Pt is progressing towards goals. She demonstrates improved finger extension thumb abduction  following repetition cueing and functional use.   Rehab Potential Good   OT Frequency 2x / week   OT Duration --  9 weeks   OT Treatment/Interventions Self-care/ADL training;Therapeutic exercise;DME and/or AE instruction;FTherapist, nutritionalTherapeutic activities;Patient/family education;Balance training;Neuromuscular education;Electrical Stimulation;Moist Heat;Energy conservation;Passive range of motion;Therapeutic exercises;Splinting;Manual Therapy;Fluidtherapy;Ultrasound;Cryotherapy;Parrafin   Plan neuro re-ed   OT Home Exercise Plan Education provided:  06/05/15 Initial HEP (table slides with light wt. bearing, tracing, and flipping cards); 06/22/16 supine ball ex and thumb opposition   Consulted and Agree with Plan of Care Patient   Family Member Consulted husband      Patient will benefit from skilled therapeutic intervention in order to improve the following deficits and impairments:  Decreased coordination, Decreased activity tolerance, Decreased knowledge of use of  DME, Decreased strength, Impaired UE functional use, Impaired tone, Decreased range of motion, Decreased balance, Decreased mobility, Difficulty walking  Visit Diagnosis: Hemiplegia and hemiparesis following cerebral infarction affecting right dominant side (HCC)  Other abnormalities of gait and mobility  Muscle weakness (generalized)  Other symptoms and signs involving the nervous system  Other lack of  coordination    Problem List Patient Active Problem List   Diagnosis Date Noted  . Slow transit constipation   . Acute blood loss anemia   . Adjustment disorder with depressed mood   . Cerebrovascular accident (CVA) ([place]) 02/03/2016  . Dysarthria, post-stroke   . Dysphagia, post-stroke   . Leukocytosis   . Prediabetes   . Right hemiparesis ([place])   . Cerebral infarction due to unspecified mechanism   . Other secondary hypertension   . Overactive bladder   . Diastolic dysfunction   . Hyperlipidemia   . Essential hypertension   . Essential tremor   . Stroke (cerebrum) ([place]) 02/01/2016  . Facial droop due to stroke 02/01/2016  . Essential and other specified forms of tremor 02/20/2013  . Dysphonia 02/20/2013  . Trigeminal neuralgia 02/20/2013    Kelly [name] 08/18/2016, 3:14 PM  [facility] [address] [place], [place], [address] Phone: [phone]   Fax:  [phone]  Name: Kelly [name] MRN: [id number] Date of Birth: 02-25-1928

## 2016-08-18 NOTE — Therapy (Signed)
[facility] [address] [place], [place], [address] Phone: [phone]   Fax:  [phone]  Physical Therapy Treatment  Patient Details  Name: Kelly [name] MRN: [id number] Date of Birth: [birth year] Referring Provider: Cecille Rubin  Encounter Date: 08/18/2016      PT End of Session - 08/18/16 1929    Visit Number 18   Number of Visits 36   Date for PT Re-Evaluation 09/25/16   Authorization Type Medicare/[place] every 10th visit   Authorization Time Period 07/30/16- 09/25/16   PT Start Time 1450   PT Stop Time 1530   PT Time Calculation (min) 40 min   Equipment Utilized During Treatment Gait belt  assist and S prn   Activity Tolerance Patient tolerated treatment well   Behavior During Therapy [facility] for tasks assessed/performed      Past Medical History:  Diagnosis Date  . Acute blood loss anemia   . Adjustment disorder with depressed mood   . CVA (cerebral vascular accident) ([place]) 02/03/2016   Linear infarct within the posterior limb of left internal capsule  . Diastolic CHF ([place])   . Dysphagia   . Dysphonia 02/20/2013  . Essential and other specified forms of tremor 02/20/2013  . HLD (hyperlipidemia)   . HTN (hypertension)   . OAB (overactive bladder)   . Osteoporosis   . Patient receiving subcutaneous heparin    For DVT prophylaxis 9/17  . Prediabetes   . Psoriasis   . Slow transit constipation   . Stroke (cerebrum) ([place]) 02/01/2016  . Trigeminal neuralgia 02/20/2013    Past Surgical History:  Procedure Laterality Date  . APPENDECTOMY  1940  . bladder tack    . CATARACT EXTRACTION Bilateral   . HEMORRHOID SURGERY  1960  . HUMERUS FRACTURE SURGERY    . LAPAROSCOPIC HYSTERECTOMY  2006  . torn rotator cuff    . WRIST FRACTURE SURGERY  2005   seconday shoulder 2001    There were no vitals filed for this visit.      Subjective Assessment - 08/18/16 1747    Subjective pt/husband report  that pt awoke this morning with right toes "throbbing." Husband observed tips of toes red "as if they'd been rubbing on the covers, if they were too tight." They were concerned wearing heel lift in her shoe would put incr pressure on toes and did not put it in her shoe today.    Patient is accompained by: Family member  Kelly [name]: husband   Patient Stated Goals Pt's goal for therapy is to walk again.   Currently in Pain? No/denies  including denies toe pain                         [facility] Adult PT Treatment/Exercise - 08/18/16 1750      Transfers   Transfers Sit to Stand;Stand to Sit   Sit to Stand 4: Min guard;With upper extremity assist;With armrests;From chair/3-in-1   Sit to Stand Details Verbal cues for precautions/safety;Verbal cues for safe use of DME/AE   Stand to Sit 5: Supervision;With upper extremity assist;To bed;To chair/3-in-1;Uncontrolled descent   Stand to Sit Details (indicate cue type and reason) Verbal cues for sequencing;Verbal cues for precautions/safety;Verbal cues for safe use of DME/AE   Number of Reps Other reps (comment)  4     Ambulation/Gait   Ambulation/Gait Assistance 4: Min guard;4: Min assist   Ambulation/Gait Assistance Details With simulated Rt toe cap, pt still  had 3 LOB requiring min assist to recover (once thought she stepped on the other foot, once staggering to her left while walking, once LOB while stepping onto 1" piece of foam)   Ambulation Distance (Feet) 140 Feet  30, 50   Assistive device Rolling walker   Gait Pattern Step-through pattern;Decreased stride length;Decreased dorsiflexion - right   Ambulation Surface Level;Indoor   Gait Comments after ambulating, removed pt's sock and shoe and observed her skin. Noted reddened area at tip of 1st toe, extending slightly to plantar surface. 2nd toe with purplish coloring on top of the toe. Pt denied pain.      Posture/Postural Control   Posture/Postural Control Postural limitations    Postural Limitations Rounded Shoulders;Forward head;Increased thoracic kyphosis   Posture Comments during rest periods in sitting worked on scapular adduction, anterior chest stretch with towel roll along her spine, and chin tuck. Pt with better understanding of chin tuck and nearlly able to obtain upright posture (seated). Did not maintain after verval cues ended.      Knee/Hip Exercises: Aerobic   Nustep L2 x 3.5 minutes with legs only             Balance Exercises - 08/18/16 1922      Balance Exercises: Standing   Standing Eyes Opened Wide ([place]);Narrow base of support (BOS);Foam/compliant surface;1 rep;30 secs  at counter on 1" foam; bil UE support; noted fatigue in LE's   SLS with Vectors Solid surface;Upper extremity assist 2  bil UE on RW; toe vs heel tap of foam "bubbles" ea LE x 15   Heel Raises Limitations x10 bil UE support on counter on 1" foam           PT Education - 08/18/16 1927    Education provided Yes   Education Details Provided pt/husband with copy of order from NP for leather toe cap for Rt shoe. Educated on available orthotists in [place] and encouraged to call and compare prices.    Person(s) Educated Patient;Spouse   Methods Explanation;Handout   Comprehension Verbalized understanding          PT Short Term Goals - 08/14/16 1912      PT SHORT TERM GOAL #1   Title Pt will perform HEP with family's supervision, to address balance, gait, transfers and strengthening.  TARGET 07/01/16 (NEW TARGET DATE FOR ALL CURRENT STGs 08/30/16)   Baseline 06/29/16: met per pt and spouse report   Status Achieved     PT SHORT TERM GOAL #2   Title Pt will improve Berg Balance score to at least 26/56 for decreased fall risk.   Baseline 06/29/16: 30/56 scored today   Status Achieved     PT SHORT TERM GOAL #3   Title Pt will improve TUG score to less than or equal to 50 seconds for decreased fall risk.   Baseline 06/29/16: 23.81 with RW   Status Achieved     PT SHORT  TERM GOAL #4   Title Pt will transfer sit<>stand, modified independently, at least 4 of 5 trials, to demo improved lower extremity strength and transfer efficiency.   Baseline 06/29/16: pt at supervision level with cues for safety/sequencing needed at times   Status Partially Met     PT SHORT TERM GOAL #5   Title Pt will ambulate at least 200 ft using RW, supervision, for improved gait safety and efficiency.   Baseline 06/29/16: met today with RW   Status Achieved     PT SHORT TERM GOAL #  6   Title Pt/family will verbalize understanding of CVA education.   Time 4   Period Weeks   Status On-going           PT Long Term Goals - 08/14/16 1913      PT LONG TERM GOAL #1   Title Pt/family will verbalize understanding of fall prevention in the home environment.  TARGET 07/31/16 (NEW TARGET DATE FOR ALL CURRENT LTGS 09/25/16)   Time 8   Period Weeks   Status On-going     PT LONG TERM GOAL #2   Title Pt will improve Berg Balance score to at least 31/56 for decreased fall risk.   Time 8   Period Weeks   Status On-going     PT LONG TERM GOAL #3   Title Pt will improve TUG score to less than or equal to 45 seconds for decreased fall risk.   Baseline 33.41 sec 07/28/16   Time 8   Period Weeks   Status Achieved     PT LONG TERM GOAL #4   Title Pt will improve gait velocity to at least 1.8 ft/sec for improved gait efficiency and safety.   Baseline 1.56 ft/sec with RW 07/28/16   Time 8   Period Weeks   Status Achieved     PT LONG TERM GOAL #5   Title Pt will ambulate at least 800 ft indoor and outdoor surfaces, using RW, modified independently, for improved outdoor and community.   Time 8   Period Weeks   Status On-going     PT LONG TERM GOAL #6   Title Pt will improve Neuro QOL score on FOTO by at least 20% for improved functional mobility.   Time 8   Period Weeks   Status On-going               Plan - 08/18/16 1931    Clinical Impression Statement Patient appears more  cautious in her gait with decreased balance compared to when this PT previously saw pt 07/30/16. Patient with 3 significant LOB requiring min assist to recover. Focused on gait,balance, and strength training. Continue to progress towards LTGs   Rehab Potential Good   PT Frequency 2x / week   PT Duration 8 weeks  updated 3/8   PT Treatment/Interventions ADLs/Self Care Home Management;Functional mobility training;Stair training;Gait training;Patient/family education;Neuromuscular re-education;Balance training;Therapeutic exercise;Therapeutic activities;DME Instruction   PT Next Visit Plan monitor Rt first toe for redness; Continue to work on gait with heel wedge and simualted toe cap until pt can have shoe modificaiton made; continue to work on transfers from soft/lower surfaces and continue to work on standing balance                      PT Home Exercise Plan son Kelly [name], PT) gave standing ex's at counter; pelvic tilts, bridging, standing bil ankle DF stretch w/ 2" block, standing knee flexion, hip abdct; seated knee flexion with tband, backward walking   Consulted and Agree with Plan of Care Patient;Family member/caregiver   Family Member Consulted Husband      Patient will benefit from skilled therapeutic intervention in order to improve the following deficits and impairments:  Abnormal gait, Decreased balance, Decreased cognition, Decreased knowledge of use of DME, Decreased range of motion, Decreased safety awareness, Decreased strength  Visit Diagnosis: Other abnormalities of gait and mobility  Muscle weakness (generalized)  Hemiplegia and hemiparesis following cerebral infarction affecting right dominant side (HCC)  Unsteadiness on feet  Problem List Patient Active Problem List   Diagnosis Date Noted  . Slow transit constipation   . Acute blood loss anemia   . Adjustment disorder with depressed mood   . Cerebrovascular accident (CVA) ([place]) 02/03/2016  . Dysarthria,  post-stroke   . Dysphagia, post-stroke   . Leukocytosis   . Prediabetes   . Right hemiparesis ([place])   . Cerebral infarction due to unspecified mechanism   . Other secondary hypertension   . Overactive bladder   . Diastolic dysfunction   . Hyperlipidemia   . Essential hypertension   . Essential tremor   . Stroke (cerebrum) ([place]) 02/01/2016  . Facial droop due to stroke 02/01/2016  . Essential and other specified forms of tremor 02/20/2013  . Dysphonia 02/20/2013  . Trigeminal neuralgia 02/20/2013    Kelly [name], PT 08/18/2016, 7:40 PM  [facility] [address] [place], [place], [address] Phone: [phone]   Fax:  [phone]  Name: Kelly [name] MRN: [id number] Date of Birth: 1927-06-12

## 2016-08-25 ENCOUNTER — Ambulatory Visit: Payer: Medicare Other | Admitting: Physical Therapy

## 2016-08-25 ENCOUNTER — Ambulatory Visit: Payer: Medicare Other | Attending: Nurse Practitioner | Admitting: Occupational Therapy

## 2016-08-25 ENCOUNTER — Encounter: Payer: Self-pay | Admitting: Physical Therapy

## 2016-08-25 DIAGNOSIS — R2689 Other abnormalities of gait and mobility: Secondary | ICD-10-CM | POA: Insufficient documentation

## 2016-08-25 DIAGNOSIS — M6281 Muscle weakness (generalized): Secondary | ICD-10-CM

## 2016-08-25 DIAGNOSIS — R29818 Other symptoms and signs involving the nervous system: Secondary | ICD-10-CM | POA: Insufficient documentation

## 2016-08-25 DIAGNOSIS — R2681 Unsteadiness on feet: Secondary | ICD-10-CM | POA: Diagnosis not present

## 2016-08-25 DIAGNOSIS — R278 Other lack of coordination: Secondary | ICD-10-CM | POA: Insufficient documentation

## 2016-08-25 DIAGNOSIS — I69351 Hemiplegia and hemiparesis following cerebral infarction affecting right dominant side: Secondary | ICD-10-CM | POA: Insufficient documentation

## 2016-08-25 NOTE — Therapy (Signed)
[facility] [address] [place], [place], [address] Phone: [phone]   Fax:  [phone]  Physical Therapy Treatment  Patient Details  Name: Kelly [name] MRN: [id number] Date of Birth: Aug 20, 1927 Referring Provider: Cecille Rubin  Encounter Date: 08/25/2016      PT End of Session - 08/25/16 2119    Visit Number 19   Number of Visits 36   Date for PT Re-Evaluation 09/25/16   Authorization Type Medicare/[place]-G[employer] every 10th visit   Authorization Time Period 07/30/16- 09/25/16   PT Start Time 1532   PT Stop Time 1613   PT Time Calculation (min) 41 min   Equipment Utilized During Treatment Gait belt  assist and S prn   Activity Tolerance Patient tolerated treatment well   Behavior During Therapy [facility] for tasks assessed/performed      Past Medical History:  Diagnosis Date  . Acute blood loss anemia   . Adjustment disorder with depressed mood   . CVA (cerebral vascular accident) ([place]) 02/03/2016   Linear infarct within the posterior limb of left internal capsule  . Diastolic CHF ([place])   . Dysphagia   . Dysphonia 02/20/2013  . Essential and other specified forms of tremor 02/20/2013  . HLD (hyperlipidemia)   . HTN (hypertension)   . OAB (overactive bladder)   . Osteoporosis   . Patient receiving subcutaneous heparin    For DVT prophylaxis 9/17  . Prediabetes   . Psoriasis   . Slow transit constipation   . Stroke (cerebrum) ([facility]) 02/01/2016  . Trigeminal neuralgia 02/20/2013    Past Surgical History:  Procedure Laterality Date  . APPENDECTOMY  1940  . bladder tack    . CATARACT EXTRACTION Bilateral   . HEMORRHOID SURGERY  1960  . HUMERUS FRACTURE SURGERY    . LAPAROSCOPIC HYSTERECTOMY  2006  . torn rotator cuff    . WRIST FRACTURE SURGERY  2005   seconday shoulder 2001    There were no vitals filed for this visit.      Subjective Assessment - 08/25/16 1532    Subjective Reports was sick  with diarrhea on Sunday and did not walk any yesterday. Toes are better and has not yet put heel lift back in shoe. Takes shoes this Friday for toe cap.    Patient is accompained by: Family member  [name]: husband   Patient Stated Goals Pt's goal for therapy is to walk again.   Currently in Pain? No/denies                         [facility] Adult PT Treatment/Exercise - 08/25/16 0001      Transfers   Transfers Sit to Stand;Stand to Sit   Sit to Stand 4: Min assist;With upper extremity assist   Sit to Stand Details (indicate cue type and reason) 16" surface no armrests; vc for set up; assist for anterior wt-shift and slight elevation in initial 4 inches; assist to control descent over last 4"   Stand to Sit 4: Min assist   Number of Reps 10 reps;1 set     Ambulation/Gait   Ambulation/Gait Assistance 4: Min guard   Ambulation/Gait Assistance Details mild Rt genu recurvatum (not using heel lift due to recent sore toes); improved step length, cues for upright posture   Ambulation Distance (Feet) 120 Feet   Assistive device Rolling walker   Gait Pattern Step-through pattern;Decreased stride length;Decreased dorsiflexion - right;Right genu recurvatum;Poor foot  clearance - right   Ambulation Surface Level;Indoor     Lumbar Exercises: Supine   Bridge 10 reps;5 seconds  feet apart, arms across chest; cues to stabilize torso     Knee/Hip Exercises: Seated   Hamstring Curl Strengthening;Both;2 sets;10 reps  green band             Balance Exercises - 08/25/16 2115      Balance Exercises: Standing   SLS Eyes open;Solid surface;Upper extremity support 2;2 reps;10 secs;20 secs  holding RW; holding PTs forearms           PT Education - 08/25/16 2119    Education provided Yes   Education Details added to HEP (see instructons)   Person(s) Educated Patient;Spouse   Methods Explanation;Demonstration;Tactile cues;Verbal cues;Handout   Comprehension Verbalized  understanding;Returned demonstration;Need further instruction          PT Short Term Goals - 08/14/16 1912      PT SHORT TERM GOAL #1   Title Pt will perform HEP with family's supervision, to address balance, gait, transfers and strengthening.  TARGET 07/01/16 (NEW TARGET DATE FOR ALL CURRENT STGs 08/30/16)   Baseline 06/29/16: met per pt and spouse report   Status Achieved     PT SHORT TERM GOAL #2   Title Pt will improve Berg Balance score to at least 26/56 for decreased fall risk.   Baseline 06/29/16: 30/56 scored today   Status Achieved     PT SHORT TERM GOAL #3   Title Pt will improve TUG score to less than or equal to 50 seconds for decreased fall risk.   Baseline 06/29/16: 23.81 with RW   Status Achieved     PT SHORT TERM GOAL #4   Title Pt will transfer sit<>stand, modified independently, at least 4 of 5 trials, to demo improved lower extremity strength and transfer efficiency.   Baseline 06/29/16: pt at supervision level with cues for safety/sequencing needed at times   Status Partially Met     PT SHORT TERM GOAL #5   Title Pt will ambulate at least 200 ft using RW, supervision, for improved gait safety and efficiency.   Baseline 06/29/16: met today with RW   Status Achieved     PT SHORT TERM GOAL #6   Title Pt/family will verbalize understanding of CVA education.   Time 4   Period Weeks   Status On-going           PT Long Term Goals - 08/14/16 1913      PT LONG TERM GOAL #1   Title Pt/family will verbalize understanding of fall prevention in the home environment.  TARGET 07/31/16 (NEW TARGET DATE FOR ALL CURRENT LTGS 09/25/16)   Time 8   Period Weeks   Status On-going     PT LONG TERM GOAL #2   Title Pt will improve Berg Balance score to at least 31/56 for decreased fall risk.   Time 8   Period Weeks   Status On-going     PT LONG TERM GOAL #3   Title Pt will improve TUG score to less than or equal to 45 seconds for decreased fall risk.   Baseline 33.41 sec 07/28/16    Time 8   Period Weeks   Status Achieved     PT LONG TERM GOAL #4   Title Pt will improve gait velocity to at least 1.8 ft/sec for improved gait efficiency and safety.   Baseline 1.56 ft/sec with RW 07/28/16   Time 8  Period Weeks   Status Achieved     PT LONG TERM GOAL #5   Title Pt will ambulate at least 800 ft indoor and outdoor surfaces, using RW, modified independently, for improved outdoor and community.   Time 8   Period Weeks   Status On-going     PT LONG TERM GOAL #6   Title Pt will improve Neuro QOL score on FOTO by at least 20% for improved functional mobility.   Time 8   Period Weeks   Status On-going               Plan - 08/25/16 2120    Clinical Impression Statement Patient demonstrated improved gait fluidity and confidence this session. Session focused on gait training, strengthening, and balance training. Patient reports she will receive her shoes with toe cap sometime next week.    Rehab Potential Good   PT Frequency 2x / week   PT Duration 8 weeks  updated 3/8   PT Treatment/Interventions ADLs/Self Care Home Management;Functional mobility training;Stair training;Gait training;Patient/family education;Neuromuscular re-education;Balance training;Therapeutic exercise;Therapeutic activities;DME Instruction   PT Next Visit Plan 20th visit G-code & PN; Needs to schedule more PT appts through 5/4 (currently only thru 4/17); Continue to work on gait with heel wedge and simulated toe cap until pt can have shoe modificaiton made (if she does not have her heel wedge, use our 1/4"); continue to work on transfers from soft/lower surfaces and continue to work on standing balance                      PT Home Exercise Plan son Zenia Resides Sculley, PT) gave standing ex's at counter; pelvic tilts, bridging, standing bil ankle DF stretch w/ 2" block, standing knee flexion, hip abdct; seated knee flexion with tband, backward walking   Consulted and Agree with Plan of Care  Patient;Family member/caregiver   Family Member Consulted Husband      Patient will benefit from skilled therapeutic intervention in order to improve the following deficits and impairments:  Abnormal gait, Decreased balance, Decreased cognition, Decreased knowledge of use of DME, Decreased range of motion, Decreased safety awareness, Decreased strength  Visit Diagnosis: Hemiplegia and hemiparesis following cerebral infarction affecting right dominant side (HCC)  Muscle weakness (generalized)  Other abnormalities of gait and mobility  Unsteadiness on feet     Problem List Patient Active Problem List   Diagnosis Date Noted  . Slow transit constipation   . Acute blood loss anemia   . Adjustment disorder with depressed mood   . Cerebrovascular accident (CVA) ([place]) 02/03/2016  . Dysarthria, post-stroke   . Dysphagia, post-stroke   . Leukocytosis   . Prediabetes   . Right hemiparesis ([place])   . Cerebral infarction due to unspecified mechanism   . Other secondary hypertension   . Overactive bladder   . Diastolic dysfunction   . Hyperlipidemia   . Essential hypertension   . Essential tremor   . Stroke (cerebrum) ([place]) 02/01/2016  . Facial droop due to stroke 02/01/2016  . Essential and other specified forms of tremor 02/20/2013  . Dysphonia 02/20/2013  . Trigeminal neuralgia 02/20/2013    Rexanne Mano, PT 08/25/2016, 9:25 PM  [facility] [address] [place], [place], [address] Phone: [phone]   Fax:  [phone]  Name: NASHLA ALTHOFF MRN: [id number] Date of Birth: Nov 26, 1927

## 2016-08-25 NOTE — Therapy (Addendum)
[facility] [address] [place], [place], [address] Phone: [phone]   Fax:  [phone]  Occupational Therapy Treatment  Patient Details  Name: Kelly [name] MRN: [id number] Date of Birth: 1928-02-20 Referring Provider: Darrol Angel, NP  Encounter Date: 08/25/2016      OT End of Session - 08/25/16 1538    Visit Number 20   Number of Visits 30   Date for OT Re-Evaluation 09/28/16   Authorization Type Medicare / [employer] (g-code needed)   Authorization Time Period renewal completed 07/30/16   Authorization - Visit Number 20   Authorization - Number of Visits 30   OT Start Time 1448   OT Stop Time 1532   OT Time Calculation (min) 44 min   Activity Tolerance Patient tolerated treatment well   Behavior During Therapy [facility] for tasks assessed/performed      Past Medical History:  Diagnosis Date  . Acute blood loss anemia   . Adjustment disorder with depressed mood   . CVA (cerebral vascular accident) ([facility]) 02/03/2016   Linear infarct within the posterior limb of left internal capsule  . Diastolic CHF ([facility])   . Dysphagia   . Dysphonia 02/20/2013  . Essential and other specified forms of tremor 02/20/2013  . HLD (hyperlipidemia)   . HTN (hypertension)   . OAB (overactive bladder)   . Osteoporosis   . Patient receiving subcutaneous heparin    For DVT prophylaxis 9/17  . Prediabetes   . Psoriasis   . Slow transit constipation   . Stroke (cerebrum) ([facility]) 02/01/2016  . Trigeminal neuralgia 02/20/2013    Past Surgical History:  Procedure Laterality Date  . APPENDECTOMY  1940  . bladder tack    . CATARACT EXTRACTION Bilateral   . HEMORRHOID SURGERY  1960  . HUMERUS FRACTURE SURGERY    . LAPAROSCOPIC HYSTERECTOMY  2006  . torn rotator cuff    . WRIST FRACTURE SURGERY  2005   seconday shoulder 2001    There were no vitals filed for this visit.      Subjective Assessment - 08/25/16 1451    Subjective   Pt reports that she has been able to cut food some.  Pt continues to fold laundry.  Husband reports that he just bought a tabletop ergometer.   Pertinent History adjustment disorder with depressed mood, diastolic CHF, osteoporosis, hx of fall, essential tremor, HTN, prediabetes, trigeminal neuralgia   Limitations fall risk   Patient Stated Goals be able to write, fold clothes, do dishes   Currently in Pain? No/denies   Pain Onset In the past 7 days       Checked status towards goals (home maintenance tasks, writing, 9-hole peg test, shoulder flex) and discussed progress--see goals section.  Simulated cutting food (yellow putty) with knife with red foam grip to build up.  Pt with incr ease/ability now with no facilitation, but continues to be difficulty.  Recommended placing foam over knife at home (husband reports that they have some at home) and have pt try to cut food.  Pt/husband verbalized understanding.  Copied a sentence with incr difficulty today with tremors.  Pt did not want to try with built-up grip today, but needed therapist to adjust grip on pen for writing.  Approx 75% legibility with decr spacing between letters/words.  Pt to practice sentence level at home.  In sitting, AAROM shoulder flex with BUEs with ball followed by chest press with min cueing for keeping head upright and full  ext of elbows.  In sitting, functional reaching at mid-level to grasp/release small cylinder objects with min difficulty with tremors/control, but improved grasp (min cueing).  Arm bike x 5 min level 1 for reciprocal movement/conditioning without rest.                       OT Education - 08/25/16 1549    Education Details Encouraged pt/husband to have pt try loading dishwasher with supervision for incr activity tolerance and place foam over knife for incr ease with cutting   Person(s) Educated Patient   Methods Explanation   Comprehension Verbalized understanding           OT Short Term Goals - 08/25/16 1544      OT SHORT TERM GOAL #1   Title -----------------     OT SHORT TERM GOAL #2   Title ----------------     OT SHORT TERM GOAL #3   Title --------------     OT SHORT TERM GOAL #4   Title -----------------------     OT SHORT TERM GOAL #5   Title Pt will be able to cut food mod I.--check updated STGs 08/29/16   Period Weeks   Status On-going  08/24/16:  improved and is performing inconsistently     OT SHORT TERM GOAL #6   Title Pt will be able to write 2-3 sentences with 100% legibility.   Time 4   Period Weeks   Status On-going  08/25/16  approx 75% legibility today           OT Long Term Goals - 08/25/16 1456      OT LONG TERM GOAL #1   Title Pt will be independent with updated HEP--check updated LTGs 09/28/16   Time 8   Period Weeks   Status On-going  07/28/16:  met with current HEP, but would benefit from updates     OT LONG TERM GOAL #2   Title ---------------------------     OT LONG TERM GOAL #3   Title Pt will demo at least 110* R shoulder flex with min compensation in prep for functional reach.   Period Weeks   Status Achieved  07/28/16:  100* met initial goal.  met 08/25/16  at approx this level     OT LONG TERM GOAL #4   Title Pt will improve functional reaching and grasp/release as shown by improving score on box and blocks to at least 25.  Revised 07/09/16:  as pt met initial goal of improving by 10   Baseline 3 blocks   Time 8   Period Weeks   Status On-going  07/09/16  Met, and revised goal;  07/30/16  17 blocks     OT LONG TERM GOAL #5   Title -------------------------------     OT LONG TERM GOAL #6   Title Pt will perform simple home maintenance tasks mod I.   Time 8   Period Weeks   Status Revised  07/28/16  met folded clothes, washed dishes (supervision) and revised goal     OT LONG TERM GOAL #7   Title Pt will demo improved coordination for ADLs as shown by completing 9-hole peg test in 2mn or less.   Time 8    Period Weeks   Status On-going  08/25/16:  placed 4 pegs in within 226m, completed in 72m48m37.79sec               Plan - 08/25/16 1544    Clinical Impression  Statement Pt continues to progress towards goals with improved ability to cut food and improved R shoulder flex/functional reach and coordination and grasp/release.  Pt would benefit from continued occupational therapy to improve RUE functional use and IADL performance for improved quality of life and to prevent future complications.   Rehab Potential Good   OT Frequency 2x / week   OT Duration --  9 weeks   OT Treatment/Interventions Self-care/ADL training;Therapeutic exercise;DME and/or AE instruction;Therapist, nutritional;Therapeutic activities;Patient/family education;Balance training;Neuromuscular education;Electrical Stimulation;Moist Heat;Energy conservation;Passive range of motion;Therapeutic exercises;Splinting;Manual Therapy;Fluidtherapy;Ultrasound;Cryotherapy;Parrafin   Plan continue with neuro re-ed, RUE functional use; schedule additional visits   OT Home Exercise Plan Education provided:  06/05/15 Initial HEP (table slides with light wt. bearing, tracing, and flipping cards); 06/22/16 supine ball ex and thumb opposition   Consulted and Agree with Plan of Care Patient   Family Member Consulted husband      Patient will benefit from skilled therapeutic intervention in order to improve the following deficits and impairments:  Decreased coordination, Decreased activity tolerance, Decreased knowledge of use of DME, Decreased strength, Impaired UE functional use, Impaired tone, Decreased range of motion, Decreased balance, Decreased mobility, Difficulty walking  Visit Diagnosis: Hemiplegia and hemiparesis following cerebral infarction affecting right dominant side (HCC)  Muscle weakness (generalized)  Other abnormalities of gait and mobility  Unsteadiness on feet  Other symptoms and signs involving the nervous  system  Other lack of coordination      G-Codes - 20-Sep-2016 1451    Functional Assessment Tool Used (Outpatient only) 9-hole peg test:   R-30mn 37sec.  Box and blocks test:  R-21 blocks   Functional Limitation Carrying, moving and handling objects   Carrying, Moving and Handling Objects Current Status [phone] At least 40 percent but less than 60 percent impaired, limited or restricted   Carrying, Moving and Handling Objects Goal Status (([id number] At least 20 percent but less than 40 percent impaired, limited or restricted      Problem List Patient Active Problem List   Diagnosis Date Noted  . Slow transit constipation   . Acute blood loss anemia   . Adjustment disorder with depressed mood   . [place]  . Dysarthria, post-stroke   . Dysphagia, post-stroke   . Leukocytosis   . Prediabetes   . Right hemiparesis ([place]   . Cerebral infarction due to unspecified mechanism   . Other secondary hypertension   . Overactive bladder   . Diastolic dysfunction   . Hyperlipidemia   . Essential hypertension   . Essential tremor   . St[place] ([place]um) ([place] 02/01/2016  . Facial droop due to stroke 02/01/2016  . Essential and other specified forms of tremor 02/20/2013  . Dysphonia 02/20/2013  . Trigeminal neuralgia 02/20/2013    Occupational Therapy Progress Note  Dates of Reporting Period: 07/09/16 to 4April 29, 2018 Objective Reports of Subjective Statement: see above  Objective Measurements: see above  Goal Update: see above  Plan: see above  Reason Skilled Services are Required: see above    FA[facility]42018/04/29 4:04 PM  [address]enter[address]SL[address] [place]aine[place] [address]Phone: [phone]  Fax:  [phone] Name: BMERIDITH ROMICKMRN: [id number] of Birth: 708-22-1929  AVianne Bulls OTR/L CSouth[facility] Center[address] [place]  [place]  [phone] [phone]/29/184:04 PM

## 2016-08-25 NOTE — Patient Instructions (Signed)
Bridge    Lie back, legs bent,Knees apart, arms across your chest/stomach. Tighten your stomach and lift your hips up off the bed. HOLD for 5 seconds. Lower down.  Repeat _10___ times. Do ___1_ sessions per day.  http://pm.exer.us/55   Copyright  VHI. All rights reserved.    Single Leg - Eyes Open    Holding support, lift right leg while maintaining balance over other leg. Progress to usisng hands on support surface as little as possible. Hold__20__ seconds. Repeat __5__ times per session. Do ____ sessions per day.  Copyright  VHI. All rights reserved.

## 2016-08-27 ENCOUNTER — Ambulatory Visit: Payer: Medicare Other | Admitting: Occupational Therapy

## 2016-08-27 ENCOUNTER — Ambulatory Visit: Payer: Medicare Other | Admitting: Physical Therapy

## 2016-08-27 ENCOUNTER — Encounter: Payer: Self-pay | Admitting: Physical Therapy

## 2016-08-27 DIAGNOSIS — R2681 Unsteadiness on feet: Secondary | ICD-10-CM

## 2016-08-27 DIAGNOSIS — M6281 Muscle weakness (generalized): Secondary | ICD-10-CM

## 2016-08-27 DIAGNOSIS — I69351 Hemiplegia and hemiparesis following cerebral infarction affecting right dominant side: Secondary | ICD-10-CM

## 2016-08-27 DIAGNOSIS — R29818 Other symptoms and signs involving the nervous system: Secondary | ICD-10-CM | POA: Diagnosis not present

## 2016-08-27 DIAGNOSIS — R2689 Other abnormalities of gait and mobility: Secondary | ICD-10-CM

## 2016-08-27 DIAGNOSIS — R278 Other lack of coordination: Secondary | ICD-10-CM

## 2016-08-27 NOTE — Therapy (Signed)
[facility] [address] [place], [place], [address] Phone: [phone]   Fax:  [phone]  Occupational Therapy Treatment  Patient Details  Name: Kelly [name] MRN: [id number] Date of Birth: 02/08/1928 Referring Provider: Cecille Rubin, NP  Encounter Date: 08/27/2016      OT End of Session - 08/27/16 1101    Visit Number 21   Number of Visits 30   Date for OT Re-Evaluation 09/28/16   Authorization Type Medicare / [employer] (g-code needed)   Authorization Time Period renewal completed 07/30/16   Authorization - Visit Number 21   Authorization - Number of Visits 30   OT Start Time 1102   OT Stop Time 1145   OT Time Calculation (min) 43 min   Activity Tolerance Patient tolerated treatment well   Behavior During Therapy [facility] [place] for tasks assessed/performed      Past Medical History:  Diagnosis Date  . Acute blood loss anemia   . Adjustment disorder with depressed mood   . CVA (cerebral vascular accident) ([place]) 02/03/2016   Linear infarct within the posterior limb of left internal capsule  . Diastolic CHF ([place])   . Dysphagia   . Dysphonia 02/20/2013  . Essential and other specified forms of tremor 02/20/2013  . HLD (hyperlipidemia)   . HTN (hypertension)   . OAB (overactive bladder)   . Osteoporosis   . Patient receiving subcutaneous heparin    For DVT prophylaxis 9/17  . Prediabetes   . Psoriasis   . Slow transit constipation   . Stroke (cerebrum) ([place]) 02/01/2016  . Trigeminal neuralgia 02/20/2013    Past Surgical History:  Procedure Laterality Date  . APPENDECTOMY  1940  . bladder tack    . CATARACT EXTRACTION Bilateral   . HEMORRHOID SURGERY  1960  . HUMERUS FRACTURE SURGERY    . LAPAROSCOPIC HYSTERECTOMY  2006  . torn rotator cuff    . WRIST FRACTURE SURGERY  2005   seconday shoulder 2001    There were no vitals filed for this visit.      Subjective Assessment - 08/27/16 1104    Subjective   "I'm better in the mornings"     Pertinent History adjustment disorder with depressed mood, diastolic CHF, osteoporosis, hx of fall, essential tremor, HTN, prediabetes, trigeminal neuralgia   Limitations fall risk   Patient Stated Goals be able to write, fold clothes, do dishes   Currently in Pain? No/denies   Pain Onset In the past 7 days      In sitting, functional reaching at mid-level to grasp/release medium cylinder objects with min difficulty with tremors/control, but improved grasp with incr ability to open hand wider (min cueing).  Wt. Bearing through RUE in sitting, with shoulder in abduction and hand on mat for decr tone and body on arm movements.  Placing various sized pegs in arc pegboard with rest after each size and min-mod difficulty with reach/coordination.    Flipping large cards with focus on finger extension and supination with min-mod difficulty.  AAROM shoulder abduction and ER in sitting with min cueing/facilitation for proper positioning.  In standing, performing AAROM shoulder flex and horizontal abduction to wipe table with min cues for incr stretch.                             OT Short Term Goals - 08/25/16 1544      OT SHORT TERM GOAL #1   Title -----------------  OT SHORT TERM GOAL #2   Title ----------------     OT SHORT TERM GOAL #3   Title --------------     OT SHORT TERM GOAL #4   Title -----------------------     OT SHORT TERM GOAL #5   Title Pt will be able to cut food mod I.--check updated STGs 08/29/16   Period Weeks   Status On-going  08/24/16:  improved and is performing inconsistently     OT SHORT TERM GOAL #6   Title Pt will be able to write 2-3 sentences with 100% legibility.   Time 4   Period Weeks   Status On-going  08/25/16  approx 75% legibility today           OT Long Term Goals - 08/25/16 1456      OT LONG TERM GOAL #1   Title Pt will be independent with updated HEP--check updated LTGs 09/28/16    Time 8   Period Weeks   Status On-going  07/28/16:  met with current HEP, but would benefit from updates     OT LONG TERM GOAL #2   Title ---------------------------     OT LONG TERM GOAL #3   Title Pt will demo at least 110* R shoulder flex with min compensation in prep for functional reach.   Period Weeks   Status Achieved  07/28/16:  100* met initial goal.  met 08/25/16  at approx this level     OT LONG TERM GOAL #4   Title Pt will improve functional reaching and grasp/release as shown by improving score on box and blocks to at least 25.  Revised 07/09/16:  as pt met initial goal of improving by 10   Baseline 3 blocks   Time 8   Period Weeks   Status On-going  07/09/16  Met, and revised goal;  07/30/16  17 blocks     OT LONG TERM GOAL #5   Title -------------------------------     OT LONG TERM GOAL #6   Title Pt will perform simple home maintenance tasks mod I.   Time 8   Period Weeks   Status Revised  07/28/16  met folded clothes, washed dishes (supervision) and revised goal     OT LONG TERM GOAL #7   Title Pt will demo improved coordination for ADLs as shown by completing 9-hole peg test in or less.   Time 8   Period Weeks   Status On-going  08/25/16:  placed 4 pegs in within , completed in 37.79sec               Plan - 08/27/16 1215    Clinical Impression Statement Pt continues to make good progress with improving coordination.  However, spasticity and fatigue impact RUE functional use.  Pt continues to fatigue quickly, and is then able to continue activities with rest.   Rehab Potential Good   OT Frequency 2x / week   OT Duration --  9 weeks   OT Treatment/Interventions Self-care/ADL training;Therapeutic exercise;DME and/or AE instruction;Building services engineer;Therapeutic activities;Patient/family education;Balance training;Neuromuscular education;Electrical Stimulation;Moist Heat;Energy conservation;Passive range of motion;Therapeutic  exercises;Splinting;Manual Therapy;Fluidtherapy;Ultrasound;Cryotherapy;Parrafin   Plan continue with neuro re-ed, RUE functional use   OT Home Exercise Plan Education provided:  06/05/15 Initial HEP (table slides with light wt. bearing, tracing, and flipping cards); 06/22/16 supine ball ex and thumb opposition   Consulted and Agree with Plan of Care Patient   Family Member Consulted husband      Patient will benefit from  skilled therapeutic intervention in order to improve the following deficits and impairments:  Decreased coordination, Decreased activity tolerance, Decreased knowledge of use of DME, Decreased strength, Impaired UE functional use, Impaired tone, Decreased range of motion, Decreased balance, Decreased mobility, Difficulty walking  Visit Diagnosis: Hemiplegia and hemiparesis following cerebral infarction affecting right dominant side (HCC)  Muscle weakness (generalized)  Other abnormalities of gait and mobility  Unsteadiness on feet  Other symptoms and signs involving the nervous system  Other lack of coordination    Problem List Patient Active Problem List   Diagnosis Date Noted  . Slow transit constipation   . Acute blood loss anemia   . Adjustment disorder with depressed mood   . Cerebrovascular accident (CVA) ([place]) 02/03/2016  . Dysarthria, post-stroke   . Dysphagia, post-stroke   . Leukocytosis   . Prediabetes   . Right hemiparesis ([place])   . Cerebral infarction due to unspecified mechanism   . Other secondary hypertension   . Overactive bladder   . Diastolic dysfunction   . Hyperlipidemia   . Essential hypertension   . Essential tremor   . Stroke (cerebrum) ([place]) 02/01/2016  . Facial droop due to stroke 02/01/2016  . Essential and other specified forms of tremor 02/20/2013  . Dysphonia 02/20/2013  . Trigeminal neuralgia 02/20/2013    [facility] 08/27/2016, 12:20 PM  [facility] [address] [place], [place], [address] Phone: [phone]   Fax:  [phone]  Name: IMOGINE CARVELL MRN: [id number] Date of Birth: 11/08/1927   Vianne Bulls, OTR/L [facility] [address] [place], [place]  [address] [phone] phone [phone] 08/27/16 12:20 PM

## 2016-08-27 NOTE — Therapy (Signed)
[facility] [address] [place], [place], [address] Phone: [phone]   Fax:  [phone]  Physical Therapy Treatment  Patient Details  Name: Kelly [name] MRN: [id number] Date of Birth: 04-Mar-1928 Referring Provider: Cecille Rubin  Encounter Date: 08/27/2016      PT End of Session - 08/27/16 2159    Visit Number 20   Number of Visits 36   Date for PT Re-Evaluation 09/25/16   Authorization Type Medicare/[place] [employer] every 10th visit   Authorization Time Period 07/30/16- 09/25/16   PT Start Time 1022   PT Stop Time 1103   PT Time Calculation (min) 41 min   Equipment Utilized During Treatment Gait belt  assist and S prn   Activity Tolerance Patient tolerated treatment well   Behavior During Therapy [facility] for tasks assessed/performed      Past Medical History:  Diagnosis Date  . Acute blood loss anemia   . Adjustment disorder with depressed mood   . CVA (cerebral vascular accident) ([place]) 02/03/2016   Linear infarct within the posterior limb of left internal capsule  . Diastolic CHF ([place])   . Dysphagia   . Dysphonia 02/20/2013  . Essential and other specified forms of tremor 02/20/2013  . HLD (hyperlipidemia)   . HTN (hypertension)   . OAB (overactive bladder)   . Osteoporosis   . Patient receiving subcutaneous heparin    For DVT prophylaxis 9/17  . Prediabetes   . Psoriasis   . Slow transit constipation   . Stroke (cerebrum) ([place]) 02/01/2016  . Trigeminal neuralgia 02/20/2013    Past Surgical History:  Procedure Laterality Date  . APPENDECTOMY  1940  . bladder tack    . CATARACT EXTRACTION Bilateral   . HEMORRHOID SURGERY  1960  . HUMERUS FRACTURE SURGERY    . LAPAROSCOPIC HYSTERECTOMY  2006  . torn rotator cuff    . WRIST FRACTURE SURGERY  2005   seconday shoulder 2001    There were no vitals filed for this visit.      Subjective Assessment - 08/27/16 1026    Subjective Reports she got a  pedaler yesterday and used it for her legs and arms.    Patient is accompained by: Family member  [name]: husband   Patient Stated Goals Pt's goal for therapy is to walk again.   Currently in Pain? No/denies                         [facility] Adult PT Treatment/Exercise - 08/27/16 0001      Transfers   Transfers Sit to Stand;Stand to Sit   Sit to Stand 4: Min assist;4: Min guard;With upper extremity assist   Sit to Stand Details (indicate cue type and reason) 18" cushioned surface without armrests; initially required min assist and progressed to minguard   Stand to Sit 4: Min guard   Number of Reps 10 reps;1 set     Ambulation/Gait   Ambulation/Gait Assistance 4: Min guard   Ambulation/Gait Assistance Details Due to continued recurvatum on RLE, added additional 1/4" heel lift iside shoe (total of 1/2") and then added 1/2" heel lift to outside/sole of shoe; initially recurvatum did not occur, however after 50 ft, began again   Ambulation Distance (Feet) 120 Feet  x3   Assistive device Rolling walker  plus simulated toe cap   Gait Pattern Step-through pattern;Decreased stride length;Decreased dorsiflexion - right;Right genu recurvatum;Poor foot clearance - right   Ambulation  Surface Level;Indoor   Stairs Yes   Stairs Assistance 4: Min guard   Stairs Assistance Details (indicate cue type and reason) steadying assist, esp if foot catches front lip of step   Stair Management Technique One rail Right;Alternating pattern;Forwards   Number of Stairs 4   Height of Stairs 6     Knee/Hip Exercises: Standing   SLS with Vectors stodd on floor, tapped alernating feet on 2nd step x 10 each                PT Education - 08/27/16 2157    Education provided Yes   Education Details risk of knee injury due to continued recurvatum when walking   Person(s) Educated Patient;Spouse   Methods Explanation;Demonstration   Comprehension Verbalized understanding          PT Short  Term Goals - 08/14/16 1912      PT SHORT TERM GOAL #1   Title Pt will perform HEP with family's supervision, to address balance, gait, transfers and strengthening.  TARGET 07/01/16 (NEW TARGET DATE FOR ALL CURRENT STGs 08/30/16)   Baseline 06/29/16: met per pt and spouse report   Status Achieved     PT SHORT TERM GOAL #2   Title Pt will improve Berg Balance score to at least 26/56 for decreased fall risk.   Baseline 06/29/16: 30/56 scored today   Status Achieved     PT SHORT TERM GOAL #3   Title Pt will improve TUG score to less than or equal to 50 seconds for decreased fall risk.   Baseline 06/29/16: 23.81 with RW   Status Achieved     PT SHORT TERM GOAL #4   Title Pt will transfer sit<>stand, modified independently, at least 4 of 5 trials, to demo improved lower extremity strength and transfer efficiency.   Baseline 06/29/16: pt at supervision level with cues for safety/sequencing needed at times   Status Partially Met     PT SHORT TERM GOAL #5   Title Pt will ambulate at least 200 ft using RW, supervision, for improved gait safety and efficiency.   Baseline 06/29/16: met today with RW   Status Achieved     PT SHORT TERM GOAL #6   Title Pt/family will verbalize understanding of CVA education.   Time 4   Period Weeks   Status On-going           PT Long Term Goals - 08/14/16 1913      PT LONG TERM GOAL #1   Title Pt/family will verbalize understanding of fall prevention in the home environment.  TARGET 07/31/16 (NEW TARGET DATE FOR ALL CURRENT LTGS 09/25/16)   Time 8   Period Weeks   Status On-going     PT LONG TERM GOAL #2   Title Pt will improve Berg Balance score to at least 31/56 for decreased fall risk.   Time 8   Period Weeks   Status On-going     PT LONG TERM GOAL #3   Title Pt will improve TUG score to less than or equal to 45 seconds for decreased fall risk.   Baseline 33.41 sec 07/28/16   Time 8   Period Weeks   Status Achieved     PT LONG TERM GOAL #4   Title Pt  will improve gait velocity to at least 1.8 ft/sec for improved gait efficiency and safety.   Baseline 1.56 ft/sec with RW 07/28/16   Time 8   Period Weeks   Status Achieved  PT LONG TERM GOAL #5   Title Pt will ambulate at least 800 ft indoor and outdoor surfaces, using RW, modified independently, for improved outdoor and community.   Time 8   Period Weeks   Status On-going     PT LONG TERM GOAL #6   Title Pt will improve Neuro QOL score on FOTO by at least 20% for improved functional mobility.   Time 8   Period Weeks   Status On-going               Plan - 09/26/16 2200    Clinical Impression Statement Session focused on gait training with 1/2 " heel lift inside shoe and 1/2" heel lift taped to outside bottom of shoe to prevent genu recurvatum. Verbal and tactile cuing throuhgout session to assist pt with awareness of recurvatum and pt able to maintain slight knee flexion ~50% of the time.    Rehab Potential Good   PT Frequency 2x / week   PT Duration 8 weeks   PT Treatment/Interventions ADLs/Self Care Home Management;Functional mobility training;Stair training;Gait training;Patient/family education;Neuromuscular re-education;Balance training;Therapeutic exercise;Therapeutic activities;DME Instruction   PT Next Visit Plan Continue to work on gait with heel wedge and simulated toe cap until pt can have shoe modificaiton made (if she does not have her heel wedge, use our 1/4"); continue to work on transfers from soft/lower surfaces and continue to work on standing balance                      Consulted and Agree with Plan of Care Patient;Family member/caregiver   Family Member Consulted Husband      Patient will benefit from skilled therapeutic intervention in order to improve the following deficits and impairments:  Abnormal gait, Decreased balance, Decreased cognition, Decreased knowledge of use of DME, Decreased range of motion, Decreased safety awareness, Decreased  strength  Visit Diagnosis: Hemiplegia and hemiparesis following cerebral infarction affecting right dominant side (HCC)  Muscle weakness (generalized)  Other abnormalities of gait and mobility  Unsteadiness on feet       G-Codes - 09-26-16 08/30/2210    Functional Assessment Tool Used (Outpatient Only) 06/01/16 TUG 55.71 sec,  07/28/16 35.03 sec  gait velocity 1.56 ft/sec   Functional Limitation Mobility: Walking and moving around   Mobility: Walking and Moving Around Current Status [phone]) At least 40 percent but less than 60 percent impaired, limited or restricted   Mobility: Walking and Moving Around Goal Status [phone]) At least 20 percent but less than 40 percent impaired, limited or restricted     Physical Therapy Progress Note  Dates of Reporting Period: 06/01/16 to Sep 26, 2016  Objective Reports of Subjective Statement: Feels she is walking better (gait velocity improved)  Objective Measurements: gait velocity; TUG  Goal Update: NA  Plan: see above  Reason Skilled Services are Required: continued fall risk   Problem List Patient Active Problem List   Diagnosis Date Noted  . Slow transit constipation   . Acute blood loss anemia   . Adjustment disorder with depressed mood   . Cerebrovascular accident (CVA) ([place]) 02/03/2016  . Dysarthria, post-stroke   . Dysphagia, post-stroke   . Leukocytosis   . Prediabetes   . Right hemiparesis ([place])   . Cerebral infarction due to unspecified mechanism   . Other secondary hypertension   . Overactive bladder   . Diastolic dysfunction   . Hyperlipidemia   . Essential hypertension   . Essential tremor   . Stroke (cerebrum) (  [place]) 02/01/2016  . Facial droop due to stroke 02/01/2016  . Essential and other specified forms of tremor 02/20/2013  . Dysphonia 02/20/2013  . Trigeminal neuralgia 02/20/2013    Rexanne Mano, PT 08/27/2016, 10:19 PM  [facility] [address] [place], [place], [address] Phone: [phone]   Fax:  [phone]  Name: Kelly [name] MRN: [id number] Date of Birth: March 25, 1928

## 2016-09-01 ENCOUNTER — Ambulatory Visit: Payer: Medicare Other | Admitting: Physical Therapy

## 2016-09-01 ENCOUNTER — Encounter: Payer: Self-pay | Admitting: Physical Therapy

## 2016-09-01 ENCOUNTER — Ambulatory Visit: Payer: Medicare Other | Admitting: Occupational Therapy

## 2016-09-01 DIAGNOSIS — R278 Other lack of coordination: Secondary | ICD-10-CM

## 2016-09-01 DIAGNOSIS — R2689 Other abnormalities of gait and mobility: Secondary | ICD-10-CM

## 2016-09-01 DIAGNOSIS — R29818 Other symptoms and signs involving the nervous system: Secondary | ICD-10-CM | POA: Diagnosis not present

## 2016-09-01 DIAGNOSIS — M6281 Muscle weakness (generalized): Secondary | ICD-10-CM

## 2016-09-01 DIAGNOSIS — R2681 Unsteadiness on feet: Secondary | ICD-10-CM | POA: Diagnosis not present

## 2016-09-01 DIAGNOSIS — I69351 Hemiplegia and hemiparesis following cerebral infarction affecting right dominant side: Secondary | ICD-10-CM

## 2016-09-01 NOTE — Therapy (Signed)
[facility] [address] [place], [place], [address] Phone: [phone]   Fax:  [phone]  Physical Therapy Treatment  Patient Details  Name: Kelly [name] MRN: [id number] Date of Birth: 1927-08-09 Referring Provider: Cecille Rubin  Encounter Date: 09/01/2016      PT End of Session - 09/01/16 2317    Visit Number 21   Number of Visits 36   Date for PT Re-Evaluation 09/25/16   Authorization Type Medicare/[employer] every 10th visit   Authorization Time Period 07/30/16- 09/25/16   PT Start Time 1533   PT Stop Time 1613   PT Time Calculation (min) 40 min   Equipment Utilized During Treatment --  assist and S prn   Activity Tolerance Patient tolerated treatment well   Behavior During Therapy [facility] for tasks assessed/performed      Past Medical History:  Diagnosis Date  . Acute blood loss anemia   . Adjustment disorder with depressed mood   . CVA (cerebral vascular accident) ([place]) 02/03/2016   Linear infarct within the posterior limb of left internal capsule  . Diastolic CHF ([place])   . Dysphagia   . Dysphonia 02/20/2013  . Essential and other specified forms of tremor 02/20/2013  . HLD (hyperlipidemia)   . HTN (hypertension)   . OAB (overactive bladder)   . Osteoporosis   . Patient receiving subcutaneous heparin    For DVT prophylaxis 9/17  . Prediabetes   . Psoriasis   . Slow transit constipation   . Stroke (cerebrum) ([place]) 02/01/2016  . Trigeminal neuralgia 02/20/2013    Past Surgical History:  Procedure Laterality Date  . APPENDECTOMY  1940  . bladder tack    . CATARACT EXTRACTION Bilateral   . HEMORRHOID SURGERY  1960  . HUMERUS FRACTURE SURGERY    . LAPAROSCOPIC HYSTERECTOMY  2006  . torn rotator cuff    . WRIST FRACTURE SURGERY  2005   seconday shoulder 2001    There were no vitals filed for this visit.      Subjective Assessment - 09/01/16 2309    Subjective Was able to attend Sunday  School after walking a "long way" from the parking lot to her classroom.    Patient is accompained by: Family member  [name]: husband   Patient Stated Goals Pt's goal for therapy is to walk again.   Currently in Pain? No/denies                         [facility] Adult PT Treatment/Exercise - 09/01/16 0001      Transfers   Transfers Sit to Stand;Stand to Sit   Sit to Stand 4: Min guard   Sit to Stand Details (indicate cue type and reason) 19" surface   Stand to Sit 4: Min guard   Number of Reps 10 reps;1 set   Transfer Cueing cues to scoot to EOB; educated how to place Rt heel flat on floor to help prevnet RLE from slipping with sit to stand and new toe cap on shoe.     Ambulation/Gait   Ambulation/Gait Assistance 4: Min guard   Ambulation/Gait Assistance Details No catching of RLE while walking; attempted 1/2" lift in Rt shoe and very minimal decrease in Rt knee hyperextension.    Ambulation Distance (Feet) 240 Feet  120   Assistive device Rolling walker   Gait Pattern Step-through pattern;Decreased stride length;Decreased dorsiflexion - right;Right genu recurvatum;Poor foot clearance - right   Ambulation  Surface Level;Indoor     Posture/Postural Control   Posture/Postural Control Postural limitations   Postural Limitations Rounded Shoulders;Forward head;Increased thoracic kyphosis   Posture Comments worked on upright posture in siitting and standing     Knee/Hip Exercises: Standing   Other Standing Knee Exercises Standing, closed chain hip/knee extension with small between back of pt's knee and mat table (in standing)                PT Education - 09/01/16 2316    Education provided Yes   Education Details Safe use of Rt shoe with leather toe cap   Person(s) Educated Patient;Spouse   Methods Explanation;Demonstration   Comprehension Verbalized understanding;Need further instruction          PT Short Term Goals - 08/14/16 1912      PT SHORT TERM GOAL  #1   Title Pt will perform HEP with family's supervision, to address balance, gait, transfers and strengthening.  TARGET 07/01/16 (NEW TARGET DATE FOR ALL CURRENT STGs 08/30/16)   Baseline 06/29/16: met per pt and spouse report   Status Achieved     PT SHORT TERM GOAL #2   Title Pt will improve Berg Balance score to at least 26/56 for decreased fall risk.   Baseline 06/29/16: 30/56 scored today   Status Achieved     PT SHORT TERM GOAL #3   Title Pt will improve TUG score to less than or equal to 50 seconds for decreased fall risk.   Baseline 06/29/16: 23.81 with RW   Status Achieved     PT SHORT TERM GOAL #4   Title Pt will transfer sit<>stand, modified independently, at least 4 of 5 trials, to demo improved lower extremity strength and transfer efficiency.   Baseline 06/29/16: pt at supervision level with cues for safety/sequencing needed at times   Status Partially Met     PT SHORT TERM GOAL #5   Title Pt will ambulate at least 200 ft using RW, supervision, for improved gait safety and efficiency.   Baseline 06/29/16: met today with RW   Status Achieved     PT SHORT TERM GOAL #6   Title Pt/family will verbalize understanding of CVA education.   Time 4   Period Weeks   Status On-going           PT Long Term Goals - 08/14/16 1913      PT LONG TERM GOAL #1   Title Pt/family will verbalize understanding of fall prevention in the home environment.  TARGET 07/31/16 (NEW TARGET DATE FOR ALL CURRENT LTGS 09/25/16)   Time 8   Period Weeks   Status On-going     PT LONG TERM GOAL #2   Title Pt will improve Berg Balance score to at least 31/56 for decreased fall risk.   Time 8   Period Weeks   Status On-going     PT LONG TERM GOAL #3   Title Pt will improve TUG score to less than or equal to 45 seconds for decreased fall risk.   Baseline 33.41 sec 07/28/16   Time 8   Period Weeks   Status Achieved     PT LONG TERM GOAL #4   Title Pt will improve gait velocity to at least 1.8 ft/sec for  improved gait efficiency and safety.   Baseline 1.56 ft/sec with RW 07/28/16   Time 8   Period Weeks   Status Achieved     PT LONG TERM GOAL #5   Title  Pt will ambulate at least 800 ft indoor and outdoor surfaces, using RW, modified independently, for improved outdoor and community.   Time 8   Period Weeks   Status On-going     PT LONG TERM GOAL #6   Title Pt will improve Neuro QOL score on FOTO by at least 20% for improved functional mobility.   Time 8   Period Weeks   Status On-going               Plan - 09/01/16 2318    Clinical Impression Statement Session focused on safety issues and proper use of Rt leather toe cap (especially during sit to stand). Rt knee continued to hyperextend despite use of 1/2" heel lift inside shoe. In isolated anterior weight shift, through stance and left leg stepping, she is able to control Rt knee in slight flexion. Pt maintained this improvement x 20 ft of gait with RW and reverted back to hyperextension. Patient reports, "I'm not going to worry too much about my knee popping back.... I'm 83 years old."    Rehab Potential Good   PT Frequency 2x / week   PT Duration 8 weeks   PT Treatment/Interventions ADLs/Self Care Home Management;Functional mobility training;Stair training;Gait training;Patient/family education;Neuromuscular re-education;Balance training;Therapeutic exercise;Therapeutic activities;DME Instruction   PT Next Visit Plan has goal to walk 800 ft indoor/outdoor; continue to work on transfers from soft/lower surfaces; continue to work on standing balance                      Consulted and Agree with Plan of Care Patient;Family member/caregiver   Family Member Consulted Husband      Patient will benefit from skilled therapeutic intervention in order to improve the following deficits and impairments:  Abnormal gait, Decreased balance, Decreased cognition, Decreased knowledge of use of DME, Decreased range of motion, Decreased safety  awareness, Decreased strength  Visit Diagnosis: Hemiplegia and hemiparesis following cerebral infarction affecting right dominant side (HCC)  Other abnormalities of gait and mobility  Unsteadiness on feet     Problem List Patient Active Problem List   Diagnosis Date Noted  . Slow transit constipation   . Acute blood loss anemia   . Adjustment disorder with depressed mood   . Cerebrovascular accident (CVA) ([place]) 02/03/2016  . Dysarthria, post-stroke   . Dysphagia, post-stroke   . Leukocytosis   . Prediabetes   . Right hemiparesis ([place])   . Cerebral infarction due to unspecified mechanism   . Other secondary hypertension   . Overactive bladder   . Diastolic dysfunction   . Hyperlipidemia   . Essential hypertension   . Essential tremor   . Stroke (cerebrum) ([place]) 02/01/2016  . Facial droop due to stroke 02/01/2016  . Essential and other specified forms of tremor 02/20/2013  . Dysphonia 02/20/2013  . Trigeminal neuralgia 02/20/2013    Jeanie Cooks Jerric Oyen. PT 09/01/2016, 11:31 PM  [facility] [address] [place], [place], [address] Phone: [phone]   Fax:  [phone]  Name: Kelly [name] MRN: [id number] Date of Birth: 17-Jun-1927

## 2016-09-01 NOTE — Therapy (Signed)
[facility] [address] [place], [place], [address] Phone: [phone]   Fax:  [phone]  Occupational Therapy Treatment  Patient Details  Name: Kelly [name] MRN: [id number] Date of Birth: 04-Sep-1927 Referring Provider: Cecille Rubin, NP  Encounter Date: 09/01/2016      OT End of Session - 09/01/16 1550    Visit Number 22   Number of Visits 30   Date for OT Re-Evaluation 09/28/16   Authorization Type Medicare / [employer] (g-code needed)   Authorization Time Period renewal completed 07/30/16   Authorization - Visit Number 74   Authorization - Number of Visits 30   OT Start Time 1450   OT Stop Time 1532   OT Time Calculation (min) 42 min   Activity Tolerance Patient tolerated treatment well   Behavior During Therapy [facility] for tasks assessed/performed      Past Medical History:  Diagnosis Date  . Acute blood loss anemia   . Adjustment disorder with depressed mood   . CVA (cerebral vascular accident) ([place]) 02/03/2016   Linear infarct within the posterior limb of left internal capsule  . Diastolic CHF ([place])   . Dysphagia   . Dysphonia 02/20/2013  . Essential and other specified forms of tremor 02/20/2013  . HLD (hyperlipidemia)   . HTN (hypertension)   . OAB (overactive bladder)   . Osteoporosis   . Patient receiving subcutaneous heparin    For DVT prophylaxis 9/17  . Prediabetes   . Psoriasis   . Slow transit constipation   . Stroke (cerebrum) ([place]) 02/01/2016  . Trigeminal neuralgia 02/20/2013    Past Surgical History:  Procedure Laterality Date  . APPENDECTOMY  1940  . bladder tack    . CATARACT EXTRACTION Bilateral   . HEMORRHOID SURGERY  1960  . HUMERUS FRACTURE SURGERY    . LAPAROSCOPIC HYSTERECTOMY  2006  . torn rotator cuff    . WRIST FRACTURE SURGERY  2005   seconday shoulder 2001    There were no vitals filed for this visit.      Subjective Assessment - 09/01/16 1548    Subjective   Pt reports that she has been using peddler with her feet for 15 min   Pertinent History adjustment disorder with depressed mood, diastolic CHF, osteoporosis, hx of fall, essential tremor, HTN, prediabetes, trigeminal neuralgia   Limitations fall risk   Patient Stated Goals be able to write, fold clothes, do dishes   Currently in Pain? No/denies   Pain Onset In the past 7 days       In supine, AAROM exercises with 2lb weighted ball with BUEs for shoulder flex, chest press, and diagonals to each side.  In tall kneeling for incr core stability, rolling ball forward for Shoulder AAROM with min facilitation.  In quadraped over ball for incr shoulder stability and wt. Bearing through [place] for decr tone with min-mod facilitation.  In sitting, functional reaching at mid-level to grasp/release medium cylinder objects with min difficulty with tremors/control, but improved grasp with incr ability to open hand wider (min cueing).  Incr difficulty with fatigue and incr spasticity with fatigue.  Wt. Bearing through RUE in sitting, with shoulder in abduction and hand on mat for decr tone and body on arm movements.  Copying sentence with approx 75% legibility and decr in size as well as tremors noted.  Then copying and tracing lower case letters with mod difficulty, particularly on R side of page.  Instructed pt to move page  to the L as she gets toward the middle of the page with improvement noted.  Pt to practice at home and given pages to do so.                      OT Short Term Goals - 08/25/16 1544      OT SHORT TERM GOAL #1   Title -----------------     OT SHORT TERM GOAL #2   Title ----------------     OT SHORT TERM GOAL #3   Title --------------     OT SHORT TERM GOAL #4   Title -----------------------     OT SHORT TERM GOAL #5   Title Pt will be able to cut food mod I.--check updated STGs 08/29/16   Period Weeks   Status On-going  08/24/16:  improved and is performing  inconsistently     OT SHORT TERM GOAL #6   Title Pt will be able to write 2-3 sentences with 100% legibility.   Time 4   Period Weeks   Status On-going  08/25/16  approx 75% legibility today           OT Long Term Goals - 08/25/16 1456      OT LONG TERM GOAL #1   Title Pt will be independent with updated HEP--check updated LTGs 09/28/16   Time 8   Period Weeks   Status On-going  07/28/16:  met with current HEP, but would benefit from updates     OT LONG TERM GOAL #2   Title ---------------------------     OT LONG TERM GOAL #3   Title Pt will demo at least 110* R shoulder flex with min compensation in prep for functional reach.   Period Weeks   Status Achieved  07/28/16:  100* met initial goal.  met 08/25/16  at approx this level     OT LONG TERM GOAL #4   Title Pt will improve functional reaching and grasp/release as shown by improving score on box and blocks to at least 25.  Revised 07/09/16:  as pt met initial goal of improving by 10   Baseline 3 blocks   Time 8   Period Weeks   Status On-going  07/09/16  Met, and revised goal;  07/30/16  17 blocks     OT LONG TERM GOAL #5   Title -------------------------------     OT LONG TERM GOAL #6   Title Pt will perform simple home maintenance tasks mod I.   Time 8   Period Weeks   Status Revised  07/28/16  met folded clothes, washed dishes (supervision) and revised goal     OT LONG TERM GOAL #7   Title Pt will demo improved coordination for ADLs as shown by completing 9-hole peg test in 89mn or less.   Time 8   Period Weeks   Status On-going  08/25/16:  placed 4 pegs in within 255m, completed in 17m29m37.79sec               Plan - 09/01/16 1550    Clinical Impression Statement Pt continues to progress with RUE functional use.  Spasticity and fatigue are still barriers.  Pt is scheduled for Botox again in approx 2 weeks.   Rehab Potential Good   OT Frequency 2x / week   OT Duration --  9 weeks   OT  Treatment/Interventions Self-care/ADL training;Therapeutic exercise;DME and/or AE instruction;FunTherapist, nutritionalerapeutic activities;Patient/family education;Balance training;Neuromuscular education;Electrical Stimulation;Moist Heat;Energy conservation;Passive range of motion;Therapeutic  exercises;Splinting;Manual Therapy;Fluidtherapy;Ultrasound;Cryotherapy;Parrafin   Plan continue with neuro re-ed, RUE functional use, ?load/unload top rack of dishwasher   OT Home Exercise Plan Education provided:  06/05/15 Initial HEP (table slides with light wt. bearing, tracing, and flipping cards); 06/22/16 supine ball ex and thumb opposition   Consulted and Agree with Plan of Care Patient   Family Member Consulted husband      Patient will benefit from skilled therapeutic intervention in order to improve the following deficits and impairments:  Decreased coordination, Decreased activity tolerance, Decreased knowledge of use of DME, Decreased strength, Impaired UE functional use, Impaired tone, Decreased range of motion, Decreased balance, Decreased mobility, Difficulty walking  Visit Diagnosis: Hemiplegia and hemiparesis following cerebral infarction affecting right dominant side (HCC)  Muscle weakness (generalized)  Other abnormalities of gait and mobility  Unsteadiness on feet  Other symptoms and signs involving the nervous system  Other lack of coordination    Problem List Patient Active Problem List   Diagnosis Date Noted  . Slow transit constipation   . Acute blood loss anemia   . Adjustment disorder with depressed mood   . Cerebrovascular accident (CVA) ([place]) 02/03/2016  . Dysarthria, post-stroke   . Dysphagia, post-stroke   . Leukocytosis   . Prediabetes   . Right hemiparesis ([place])   . Cerebral infarction due to unspecified mechanism   . Other secondary hypertension   . Overactive bladder   . Diastolic dysfunction   . Hyperlipidemia   . Essential hypertension   .  Essential tremor   . Stroke (cerebrum) ([place]) 02/01/2016  . Facial droop due to stroke 02/01/2016  . Essential and other specified forms of tremor 02/20/2013  . Dysphonia 02/20/2013  . Trigeminal neuralgia 02/20/2013    [facility] 09/01/2016, 3:59 PM  [facility] [address] [place], [place], [address] Phone: [phone]   Fax:  [phone]  Name: Kelly [name] MRN: [id number] Date of Birth: 1927-06-06

## 2016-09-03 ENCOUNTER — Ambulatory Visit: Payer: Medicare Other | Admitting: Physical Therapy

## 2016-09-03 ENCOUNTER — Ambulatory Visit: Payer: Medicare Other | Admitting: Occupational Therapy

## 2016-09-03 ENCOUNTER — Encounter: Payer: Self-pay | Admitting: Physical Therapy

## 2016-09-03 DIAGNOSIS — R2681 Unsteadiness on feet: Secondary | ICD-10-CM

## 2016-09-03 DIAGNOSIS — I69351 Hemiplegia and hemiparesis following cerebral infarction affecting right dominant side: Secondary | ICD-10-CM | POA: Diagnosis not present

## 2016-09-03 DIAGNOSIS — R278 Other lack of coordination: Secondary | ICD-10-CM

## 2016-09-03 DIAGNOSIS — R29818 Other symptoms and signs involving the nervous system: Secondary | ICD-10-CM

## 2016-09-03 DIAGNOSIS — M6281 Muscle weakness (generalized): Secondary | ICD-10-CM

## 2016-09-03 DIAGNOSIS — R2689 Other abnormalities of gait and mobility: Secondary | ICD-10-CM

## 2016-09-03 NOTE — Therapy (Signed)
[facility] [address] [place], [place], [address] Phone: [phone]   Fax:  [phone]  Occupational Therapy Treatment  Patient Details  Name: Kelly [name] MRN: [id number] Date of Birth: 08/16/1927 Referring Provider: Cecille Rubin, NP  Encounter Date: 09/03/2016      OT End of Session - 09/03/16 1547    Visit Number 23   Number of Visits 30   Date for OT Re-Evaluation 09/28/16   Authorization Type Medicare / [employer] (g-code needed)   Authorization Time Period renewal completed 07/30/16   Authorization - Visit Number 23   Authorization - Number of Visits 30   OT Start Time 1448   OT Stop Time 1532   OT Time Calculation (min) 44 min   Activity Tolerance Patient tolerated treatment well   Behavior During Therapy [facility] for tasks assessed/performed      Past Medical History:  Diagnosis Date  . Acute blood loss anemia   . Adjustment disorder with depressed mood   . CVA (cerebral vascular accident) ([place]) 02/03/2016   Linear infarct within the posterior limb of left internal capsule  . Diastolic CHF ([place])   . Dysphagia   . Dysphonia 02/20/2013  . Essential and other specified forms of tremor 02/20/2013  . HLD (hyperlipidemia)   . HTN (hypertension)   . OAB (overactive bladder)   . Osteoporosis   . Patient receiving subcutaneous heparin    For DVT prophylaxis 9/17  . Prediabetes   . Psoriasis   . Slow transit constipation   . Stroke (cerebrum) ([place]) 02/01/2016  . Trigeminal neuralgia 02/20/2013    Past Surgical History:  Procedure Laterality Date  . APPENDECTOMY  1940  . bladder tack    . CATARACT EXTRACTION Bilateral   . HEMORRHOID SURGERY  1960  . HUMERUS FRACTURE SURGERY    . LAPAROSCOPIC HYSTERECTOMY  2006  . torn rotator cuff    . WRIST FRACTURE SURGERY  2005   seconday shoulder 2001    There were no vitals filed for this visit.      Subjective Assessment - 09/03/16 1546    Subjective   Pt/husband express concerns that pt has not attempted to use public restroom   Pertinent History adjustment disorder with depressed mood, diastolic CHF, osteoporosis, hx of fall, essential tremor, HTN, prediabetes, trigeminal neuralgia   Limitations fall risk   Patient Stated Goals be able to write, fold clothes, do dishes   Currently in Pain? No/denies   Pain Onset In the past 7 days        Discussed using public restrooms (pt currently timing outings to avoid use):  Recommended pt try single stall/family restroom in familiar place first so that husband can assist prn and to try at a time where she does not need to go, then recommended husband open door only in single stall (if self-closing door), then go in a familiar restroom (at church) where other ladies would be able to assist prn, and then in multi-stall restroom with husband opening door only prn (All when pt does not need to go to decr stress/anxiety and help pt to feel more comfortable).  Pt/husband verbalized understanding.  Therapist went in [employer] with pt.  Pt needed assistance due to heavy self-closing door, but pt then able to transfer on/off toilet mod I using grab bar.  Attempted with bar on R and with bar on L and pt able to complete with bar on either side.  In supine, AAROM exercises  with 2lb weighted ball with BUEs for shoulder flex, chest press, and diagonals to each side.  Placing various sized pegs in arc pegboard with in standing (for low range reach due to shoulder fatigue) and min-mod difficulty with reach/coordination.                          OT Short Term Goals - 08/25/16 1544      OT SHORT TERM GOAL #1   Title -----------------     OT SHORT TERM GOAL #2   Title ----------------     OT SHORT TERM GOAL #3   Title --------------     OT SHORT TERM GOAL #4   Title -----------------------     OT SHORT TERM GOAL #5   Title Pt will be able to cut food mod I.--check updated STGs  08/29/16   Period Weeks   Status On-going  08/24/16:  improved and is performing inconsistently     OT SHORT TERM GOAL #6   Title Pt will be able to write 2-3 sentences with 100% legibility.   Time 4   Period Weeks   Status On-going  08/25/16  approx 75% legibility today           OT Long Term Goals - 08/25/16 1456      OT LONG TERM GOAL #1   Title Pt will be independent with updated HEP--check updated LTGs 09/28/16   Time 8   Period Weeks   Status On-going  07/28/16:  met with current HEP, but would benefit from updates     OT LONG TERM GOAL #2   Title ---------------------------     OT LONG TERM GOAL #3   Title Pt will demo at least 110* R shoulder flex with min compensation in prep for functional reach.   Period Weeks   Status Achieved  07/28/16:  100* met initial goal.  met 08/25/16  at approx this level     OT LONG TERM GOAL #4   Title Pt will improve functional reaching and grasp/release as shown by improving score on box and blocks to at least 25.  Revised 07/09/16:  as pt met initial goal of improving by 10   Baseline 3 blocks   Time 8   Period Weeks   Status On-going  07/09/16  Met, and revised goal;  07/30/16  17 blocks     OT LONG TERM GOAL #5   Title -------------------------------     OT LONG TERM GOAL #6   Title Pt will perform simple home maintenance tasks mod I.   Time 8   Period Weeks   Status Revised  07/28/16  met folded clothes, washed dishes (supervision) and revised goal     OT LONG TERM GOAL #7   Title Pt will demo improved coordination for ADLs as shown by completing 9-hole peg test in 36mn or less.   Time 8   Period Weeks   Status On-going  08/25/16:  placed 4 pegs in within 239m, completed in 41m12m37.79sec               Plan - 09/03/16 1547    Clinical Impression Statement Pt continues to progress towards goals.  Pt demo ability to perform transfer in restroom with 1 handrail (either side), but may need assistance with self-closing doors.      Rehab Potential Good   OT Frequency 2x / week   OT Duration --  9 weeks  OT Treatment/Interventions Self-care/ADL training;Therapeutic exercise;DME and/or AE instruction;Therapist, nutritional;Therapeutic activities;Patient/family education;Balance training;Neuromuscular education;Electrical Stimulation;Moist Heat;Energy conservation;Passive range of motion;Therapeutic exercises;Splinting;Manual Therapy;Fluidtherapy;Ultrasound;Cryotherapy;Parrafin   Plan continue with neuro re-ed, activity tolerance, load/unload top rack of dishwasher   OT Home Exercise Plan Education provided:  06/05/15 Initial HEP (table slides with light wt. bearing, tracing, and flipping cards); 06/22/16 supine ball ex and thumb opposition   Consulted and Agree with Plan of Care Patient   Family Member Consulted husband      Patient will benefit from skilled therapeutic intervention in order to improve the following deficits and impairments:  Decreased coordination, Decreased activity tolerance, Decreased knowledge of use of DME, Decreased strength, Impaired UE functional use, Impaired tone, Decreased range of motion, Decreased balance, Decreased mobility, Difficulty walking  Visit Diagnosis: Hemiplegia and hemiparesis following cerebral infarction affecting right dominant side (HCC)  Other abnormalities of gait and mobility  Unsteadiness on feet  Muscle weakness (generalized)  Other symptoms and signs involving the nervous system  Other lack of coordination    Problem List Patient Active Problem List   Diagnosis Date Noted  . Slow transit constipation   . Acute blood loss anemia   . Adjustment disorder with depressed mood   . Cerebrovascular accident (CVA) ([place]) 02/03/2016  . Dysarthria, post-stroke   . Dysphagia, post-stroke   . Leukocytosis   . Prediabetes   . Right hemiparesis ([place])   . Cerebral infarction due to unspecified mechanism   . Other secondary hypertension   . Overactive bladder    . Diastolic dysfunction   . Hyperlipidemia   . Essential hypertension   . Essential tremor   . Stroke (cerebrum) ([place]) 02/01/2016  . Facial droop due to stroke 02/01/2016  . Essential and other specified forms of tremor 02/20/2013  . Dysphonia 02/20/2013  . Trigeminal neuralgia 02/20/2013    [facility] 09/03/2016, 3:50 PM  [place] [address] [place], [place], [address] Phone: [phone]   Fax:  [phone]  Name: Kelly [name] MRN: [id number] Date of Birth: 05-31-1927   Vianne Bulls, OTR/L [facility] [address] [place], [place]  [address] [phone] phone [phone] 09/03/16 4:02 PM

## 2016-09-04 NOTE — Therapy (Signed)
[facility] [address] [place], [place], [address] Phone: [phone]   Fax:  [phone]  Physical Therapy Treatment  Patient Details  Name: Kelly [name] MRN: [id number] Date of Birth: 04/01/1928 Referring Provider: Cecille Rubin  Encounter Date: 09/03/2016      PT End of Session - 09/03/16 1411    Visit Number 22   Number of Visits 36   Date for PT Re-Evaluation 09/25/16   Authorization Type Medicare/[place] [employer] every 10th visit   Authorization Time Period 07/30/16- 09/25/16   PT Start Time 1405   PT Stop Time 1445   PT Time Calculation (min) 40 min   Equipment Utilized During Treatment Gait belt  assist and S prn   Activity Tolerance Patient tolerated treatment well   Behavior During Therapy [facility] for tasks assessed/performed      Past Medical History:  Diagnosis Date  . Acute blood loss anemia   . Adjustment disorder with depressed mood   . CVA (cerebral vascular accident) ([place]) 02/03/2016   Linear infarct within the posterior limb of left internal capsule  . Diastolic CHF ([place])   . Dysphagia   . Dysphonia 02/20/2013  . Essential and other specified forms of tremor 02/20/2013  . HLD (hyperlipidemia)   . HTN (hypertension)   . OAB (overactive bladder)   . Osteoporosis   . Patient receiving subcutaneous heparin    For DVT prophylaxis 9/17  . Prediabetes   . Psoriasis   . Slow transit constipation   . Stroke (cerebrum) ([place]) 02/01/2016  . Trigeminal neuralgia 02/20/2013    Past Surgical History:  Procedure Laterality Date  . APPENDECTOMY  1940  . bladder tack    . CATARACT EXTRACTION Bilateral   . HEMORRHOID SURGERY  1960  . HUMERUS FRACTURE SURGERY    . LAPAROSCOPIC HYSTERECTOMY  2006  . torn rotator cuff    . WRIST FRACTURE SURGERY  2005   seconday shoulder 2001    There were no vitals filed for this visit.      Subjective Assessment - 09/03/16 1410    Subjective No new complaints.  No falls or pain to report.    Patient is accompained by: Family member  [name]- spouse   Patient Stated Goals Pt's goal for therapy is to walk again.   Currently in Pain? No/denies   Pain Score 0-No pain             [facility] Adult PT Treatment/Exercise - 09/03/16 1432      Transfers   Transfers Sit to Stand;Stand to Sit   Sit to Stand 5: Supervision;With upper extremity assist;From bed;From chair/3-in-1   Sit to Stand Details Verbal cues for precautions/safety;Verbal cues for safe use of DME/AE   Stand to Sit 4: Min guard;With upper extremity assist;To bed;To chair/3-in-1   Stand to Sit Details (indicate cue type and reason) Verbal cues for sequencing;Verbal cues for precautions/safety;Verbal cues for safe use of DME/AE     Ambulation/Gait   Ambulation/Gait Yes   Ambulation/Gait Assistance 4: Min guard   Ambulation/Gait Assistance Details trialed heel lift again with 1st gait lap with moderate episodes of genu recurvatum occuring. trialed knee cage brace on 2cd rep, removed after ~35 feet due to pt reporting pressure at one spot. provided verbal and tactile cues for increased quad activation for improved knee control with remainder of gait distance.  Ambulation Distance (Feet) 115 Feet  x 2   Assistive device Rolling walker   Gait Pattern Step-through pattern;Decreased stride length;Decreased dorsiflexion - right;Right genu recurvatum;Poor foot clearance - right   Ambulation Surface Level;Indoor     Knee/Hip Exercises: Standing   Terminal Knee Extension Strengthening;Right;2 sets;10 reps;Theraband;Limitations   Theraband Level (Terminal Knee Extension) Level 2 (Red)   Terminal Knee Extension Limitations with 5 sec holds each rep     Knee/Hip Exercises: Seated   Hamstring Curl Strengthening;Right;2 sets;10 reps;Limitations   Hamstring Limitations with red band- 5 sec holds before releasing and bringing foot back up            PT Short  Term Goals - 08/14/16 1912      PT SHORT TERM GOAL #1   Title Pt will perform HEP with family's supervision, to address balance, gait, transfers and strengthening.  TARGET 07/01/16 (NEW TARGET DATE FOR ALL CURRENT STGs 08/30/16)   Baseline 06/29/16: met per pt and spouse report   Status Achieved     PT SHORT TERM GOAL #2   Title Pt will improve Berg Balance score to at least 26/56 for decreased fall risk.   Baseline 06/29/16: 30/56 scored today   Status Achieved     PT SHORT TERM GOAL #3   Title Pt will improve TUG score to less than or equal to 50 seconds for decreased fall risk.   Baseline 06/29/16: 23.81 with RW   Status Achieved     PT SHORT TERM GOAL #4   Title Pt will transfer sit<>stand, modified independently, at least 4 of 5 trials, to demo improved lower extremity strength and transfer efficiency.   Baseline 06/29/16: pt at supervision level with cues for safety/sequencing needed at times   Status Partially Met     PT SHORT TERM GOAL #5   Title Pt will ambulate at least 200 ft using RW, supervision, for improved gait safety and efficiency.   Baseline 06/29/16: met today with RW   Status Achieved     PT SHORT TERM GOAL #6   Title Pt/family will verbalize understanding of CVA education.   Time 4   Period Weeks   Status On-going           PT Long Term Goals - 08/14/16 1913      PT LONG TERM GOAL #1   Title Pt/family will verbalize understanding of fall prevention in the home environment.  TARGET 07/31/16 (NEW TARGET DATE FOR ALL CURRENT LTGS 09/25/16)   Time 8   Period Weeks   Status On-going     PT LONG TERM GOAL #2   Title Pt will improve Berg Balance score to at least 31/56 for decreased fall risk.   Time 8   Period Weeks   Status On-going     PT LONG TERM GOAL #3   Title Pt will improve TUG score to less than or equal to 45 seconds for decreased fall risk.   Baseline 33.41 sec 07/28/16   Time 8   Period Weeks   Status Achieved     PT LONG TERM GOAL #4   Title Pt  will improve gait velocity to at least 1.8 ft/sec for improved gait efficiency and safety.   Baseline 1.56 ft/sec with RW 07/28/16   Time 8   Period Weeks   Status Achieved     PT LONG TERM GOAL #5   Title Pt will ambulate at least 800 ft indoor and outdoor surfaces, using RW, modified  independently, for improved outdoor and community.   Time 8   Period Weeks   Status On-going     PT LONG TERM GOAL #6   Title Pt will improve Neuro QOL score on FOTO by at least 20% for improved functional mobility.   Time 8   Period Weeks   Status On-going            Plan - 09/03/16 1412    Clinical Impression Statement today's skilled session continued to address knee instability/genu recurvatum with gait with heel wedge and knee cage not working/uncomfortable. Remainder of session focused on strengthening of knee and use of multimodal cues to control knee in stance with gait. Pt should benefit from continued PT to progress toward unmet goals.    Rehab Potential Good   PT Frequency 2x / week   PT Duration 8 weeks   PT Treatment/Interventions ADLs/Self Care Home Management;Functional mobility training;Stair training;Gait training;Patient/family education;Neuromuscular re-education;Balance training;Therapeutic exercise;Therapeutic activities;DME Instruction   PT Next Visit Plan has goal to walk 800 ft indoor/outdoor; continue to work on transfers from soft/lower surfaces; continue to work on standing balance; continue to work on knee strengthening/control in Building services engineer and Agree with Plan of Care Patient;Family member/caregiver   Family Member Consulted Husband      Patient will benefit from skilled therapeutic intervention in order to improve the following deficits and impairments:  Abnormal gait, Decreased balance, Decreased cognition, Decreased knowledge of use of DME, Decreased range of motion, Decreased safety awareness, Decreased strength  Visit Diagnosis: Hemiplegia and  hemiparesis following cerebral infarction affecting right dominant side (HCC)  Other abnormalities of gait and mobility  Unsteadiness on feet  Muscle weakness (generalized)     Problem List Patient Active Problem List   Diagnosis Date Noted  . Slow transit constipation   . Acute blood loss anemia   . Adjustment disorder with depressed mood   . Cerebrovascular accident (CVA) ([place]) 02/03/2016  . Dysarthria, post-stroke   . Dysphagia, post-stroke   . Leukocytosis   . Prediabetes   . Right hemiparesis ([place])   . Cerebral infarction due to unspecified mechanism   . Other secondary hypertension   . Overactive bladder   . Diastolic dysfunction   . Hyperlipidemia   . Essential hypertension   . Essential tremor   . Stroke (cerebrum) ([place]) 02/01/2016  . Facial droop due to stroke 02/01/2016  . Essential and other specified forms of tremor 02/20/2013  . Dysphonia 02/20/2013  . Trigeminal neuralgia 02/20/2013    Willow Ora, PTA, [facility] [address], [place], [place] [address] [phone] 09/04/16, 6:38 PM   Name: Kelly [name] MRN: [id number] Date of Birth: [birth year]

## 2016-09-08 ENCOUNTER — Encounter: Payer: Self-pay | Admitting: Physical Therapy

## 2016-09-08 ENCOUNTER — Ambulatory Visit: Payer: Medicare Other | Admitting: Occupational Therapy

## 2016-09-08 ENCOUNTER — Ambulatory Visit: Payer: Medicare Other | Admitting: Physical Therapy

## 2016-09-08 DIAGNOSIS — M6281 Muscle weakness (generalized): Secondary | ICD-10-CM | POA: Diagnosis not present

## 2016-09-08 DIAGNOSIS — R278 Other lack of coordination: Secondary | ICD-10-CM

## 2016-09-08 DIAGNOSIS — R2689 Other abnormalities of gait and mobility: Secondary | ICD-10-CM | POA: Diagnosis not present

## 2016-09-08 DIAGNOSIS — R2681 Unsteadiness on feet: Secondary | ICD-10-CM

## 2016-09-08 DIAGNOSIS — I69351 Hemiplegia and hemiparesis following cerebral infarction affecting right dominant side: Secondary | ICD-10-CM

## 2016-09-08 DIAGNOSIS — R29818 Other symptoms and signs involving the nervous system: Secondary | ICD-10-CM

## 2016-09-08 NOTE — Patient Instructions (Signed)
    Bridging: with Straight Leg Raise   With legs bent, lift buttocks. Then slowly extend right knee, keeping stomach tight. Then put right foot down and extend your left knee. Repeat __5__ times per set. Do _2___ sets per session. Do _1__ sessions per day.  http://orth.exer.us/1104   Copyright  VHI. All rights reserved.

## 2016-09-08 NOTE — Therapy (Signed)
[facility] [address] [place], [place], [address] Phone: [phone]   Fax:  [phone]  Physical Therapy Treatment  Patient Details  Name: Kelly [name] MRN: [id number] Date of Birth: Mar 17, 1928 Referring Provider: Cecille Rubin  Encounter Date: 09/08/2016      PT End of Session - 09/08/16 1724    Visit Number 23   Number of Visits 36   Date for PT Re-Evaluation 09/25/16   Authorization Type Medicare/[employer] every 10th visit   Authorization Time Period 07/30/16- 09/25/16   PT Start Time 1445   PT Stop Time 1530   PT Time Calculation (min) 45 min   Equipment Utilized During Treatment --  assist and S prn   Activity Tolerance Patient tolerated treatment well   Behavior During Therapy [facility] for tasks assessed/performed      Past Medical History:  Diagnosis Date  . Acute blood loss anemia   . Adjustment disorder with depressed mood   . CVA (cerebral vascular accident) ([place]) 02/03/2016   Linear infarct within the posterior limb of left internal capsule  . Diastolic CHF ([place])   . Dysphagia   . Dysphonia 02/20/2013  . Essential and other specified forms of tremor 02/20/2013  . HLD (hyperlipidemia)   . HTN (hypertension)   . OAB (overactive bladder)   . Osteoporosis   . Patient receiving subcutaneous heparin    For DVT prophylaxis 9/17  . Prediabetes   . Psoriasis   . Slow transit constipation   . Stroke (cerebrum) ([place]) 02/01/2016  . Trigeminal neuralgia 02/20/2013    Past Surgical History:  Procedure Laterality Date  . APPENDECTOMY  1940  . bladder tack    . CATARACT EXTRACTION Bilateral   . HEMORRHOID SURGERY  1960  . HUMERUS FRACTURE SURGERY    . LAPAROSCOPIC HYSTERECTOMY  2006  . torn rotator cuff    . WRIST FRACTURE SURGERY  2005   seconday shoulder 2001    There were no vitals filed for this visit.      Subjective Assessment - 09/08/16 1448    Subjective No new complaints.     Patient is accompained by: Family member  [name]- spouse   Patient Stated Goals Pt's goal for therapy is to walk again.   Currently in Pain? No/denies                         [facility] Adult PT Treatment/Exercise - 09/08/16 1710      Transfers   Transfers Sit to Stand;Stand to Sit   Sit to Stand 4: Min guard   Stand to Sit 4: Min guard   Stand to Sit Details (indicate cue type and reason) Verbal cues for sequencing;Verbal cues for precautions/safety;Verbal cues for safe use of DME/AE     Ambulation/Gait   Ambulation/Gait Assistance 4: Min guard   Ambulation/Gait Assistance Details pt using heel lift, continues with genu recurvatum; she reports she does not feel it happen and cannot correct with verbal cues only (requires hands-on assist); vc for upright posture and safe use of RW   Ambulation Distance (Feet) 120 Feet  120, 80   Assistive device Rolling walker   Gait Pattern Step-through pattern;Decreased stride length;Decreased dorsiflexion - right;Right genu recurvatum;Poor foot clearance - right   Ambulation Surface Level;Indoor   Gait Comments Pt/husband deny any occurences of Rt toe catching since leather toe cap placed on shoe. Husband states in confidence that pt has been walking less  at home and using w/c more (ever since last time she fell). He agrees she has lost confidence and regressed.      Knee/Hip Exercises: Aerobic   Nustep L3, 6.5 minutes UEs and LEs     Knee/Hip Exercises: Standing   Forward Step Up Both;1 set;10 reps;Hand Hold: 2;Step Height: 6"     Knee/Hip Exercises: Seated   Long Arc Quad Strengthening;Both;1 set;10 reps;Weights   Long Arc Quad Weight 3 lbs.     Knee/Hip Exercises: Supine   Other Supine Knee/Hip Exercises bridging with single leg extension (rt, lt, then down) x 10             Balance Exercises - 09/08/16 1721      Balance Exercises: Standing   Standing Eyes Opened Wide (BOA);Foam/compliant surface  3 minutes at counter;  bil then alternating single UE support           PT Education - 09/08/16 1724    Education provided Yes   Education Details HEP see pt instructions   Person(s) Educated Patient;Spouse   Methods Explanation;Demonstration;Handout;Tactile cues;Verbal cues   Comprehension Verbalized understanding;Returned demonstration;Need further instruction          PT Short Term Goals - 08/14/16 1912      PT SHORT TERM GOAL #1   Title Pt will perform HEP with family's supervision, to address balance, gait, transfers and strengthening.  TARGET 07/01/16 (NEW TARGET DATE FOR ALL CURRENT STGs 08/30/16)   Baseline 06/29/16: met per pt and spouse report   Status Achieved     PT SHORT TERM GOAL #2   Title Pt will improve Berg Balance score to at least 26/56 for decreased fall risk.   Baseline 06/29/16: 30/56 scored today   Status Achieved     PT SHORT TERM GOAL #3   Title Pt will improve TUG score to less than or equal to 50 seconds for decreased fall risk.   Baseline 06/29/16: 23.81 with RW   Status Achieved     PT SHORT TERM GOAL #4   Title Pt will transfer sit<>stand, modified independently, at least 4 of 5 trials, to demo improved lower extremity strength and transfer efficiency.   Baseline 06/29/16: pt at supervision level with cues for safety/sequencing needed at times   Status Partially Met     PT SHORT TERM GOAL #5   Title Pt will ambulate at least 200 ft using RW, supervision, for improved gait safety and efficiency.   Baseline 06/29/16: met today with RW   Status Achieved     PT SHORT TERM GOAL #6   Title Pt/family will verbalize understanding of CVA education.   Time 4   Period Weeks   Status On-going           PT Long Term Goals - 08/14/16 1913      PT LONG TERM GOAL #1   Title Pt/family will verbalize understanding of fall prevention in the home environment.  TARGET 07/31/16 (NEW TARGET DATE FOR ALL CURRENT LTGS 09/25/16)   Time 8   Period Weeks   Status On-going     PT LONG TERM  GOAL #2   Title Pt will improve Berg Balance score to at least 31/56 for decreased fall risk.   Time 8   Period Weeks   Status On-going     PT LONG TERM GOAL #3   Title Pt will improve TUG score to less than or equal to 45 seconds for decreased fall risk.   Baseline  33.41 sec 07/28/16   Time 8   Period Weeks   Status Achieved     PT LONG TERM GOAL #4   Title Pt will improve gait velocity to at least 1.8 ft/sec for improved gait efficiency and safety.   Baseline 1.56 ft/sec with RW 07/28/16   Time 8   Period Weeks   Status Achieved     PT LONG TERM GOAL #5   Title Pt will ambulate at least 800 ft indoor and outdoor surfaces, using RW, modified independently, for improved outdoor and community.   Time 8   Period Weeks   Status On-going     PT LONG TERM GOAL #6   Title Pt will improve Neuro QOL score on FOTO by at least 20% for improved functional mobility.   Time 8   Period Weeks   Status On-going               Plan - 09/08/16 1725    Clinical Impression Statement Session focused on gait training, transfer training, LE strengthening, and balance training. Patient without any episodes of catching Rt toe while walking. Patient remains cautious/guarded with walking with poor recall today of safe use of DME during gait and transfers.    Rehab Potential Good   PT Frequency 2x / week   PT Duration 8 weeks   PT Treatment/Interventions ADLs/Self Care Home Management;Functional mobility training;Stair training;Gait training;Patient/family education;Neuromuscular re-education;Balance training;Therapeutic exercise;Therapeutic activities;DME Instruction   PT Next Visit Plan address need to walk more and restrict use of w/c (husband told [employer] in confidence at end of session  and didn't have time to address); has goal to walk 800 ft indoor/outdoor (? go outdoors); continue to work on transfers from soft/lower surfaces and safe hand placement; continue to work on standing balance; continue  to work on knee strengthening/control in Building services engineer and Agree with Plan of Care Patient;Family member/caregiver   Family Member Consulted Husband      Patient will benefit from skilled therapeutic intervention in order to improve the following deficits and impairments:  Abnormal gait, Decreased balance, Decreased cognition, Decreased knowledge of use of DME, Decreased range of motion, Decreased safety awareness, Decreased strength  Visit Diagnosis: Hemiplegia and hemiparesis following cerebral infarction affecting right dominant side (HCC)  Other abnormalities of gait and mobility  Unsteadiness on feet  Muscle weakness (generalized)     Problem List Patient Active Problem List   Diagnosis Date Noted  . Slow transit constipation   . Acute blood loss anemia   . Adjustment disorder with depressed mood   . Cerebrovascular accident (CVA) ([place]) 02/03/2016  . Dysarthria, post-stroke   . Dysphagia, post-stroke   . Leukocytosis   . Prediabetes   . Right hemiparesis ([place])   . Cerebral infarction due to unspecified mechanism   . Other secondary hypertension   . Overactive bladder   . Diastolic dysfunction   . Hyperlipidemia   . Essential hypertension   . Essential tremor   . Stroke (cerebrum) ([place]) 02/01/2016  . Facial droop due to stroke 02/01/2016  . Essential and other specified forms of tremor 02/20/2013  . Dysphonia 02/20/2013  . Trigeminal neuralgia 02/20/2013    Rexanne Mano, PT 09/08/2016, 5:31 PM  [facility] [address] [place], [place], [address] Phone: [phone]   Fax:  [phone]  Name: Kelly [name] MRN: [id number] Date of  Birth: 06/02/1927

## 2016-09-09 NOTE — Therapy (Signed)
[facility] [address] [place], [place], [address] Phone: [phone]   Fax:  [phone]  Occupational Therapy Treatment  Patient Details  Name: Kelly [name] MRN: [id number] Date of Birth: December 05, 1927 Referring Provider: Cecille Rubin, NP  Encounter Date: 09/08/2016      OT End of Session - 09/09/16 1705    Visit Number 24   Number of Visits 30   Date for OT Re-Evaluation 09/28/16   Authorization Type Medicare / [employer] (g-code needed)   Authorization Time Period renewal completed 07/30/16   Authorization - Visit Number 24   Authorization - Number of Visits 30   OT Start Time 9735   OT Stop Time 1615   OT Time Calculation (min) 40 min   Activity Tolerance Patient tolerated treatment well   Behavior During Therapy [facility] for tasks assessed/performed      Past Medical History:  Diagnosis Date  . Acute blood loss anemia   . Adjustment disorder with depressed mood   . CVA (cerebral vascular accident) ([place]) 02/03/2016   Linear infarct within the posterior limb of left internal capsule  . Diastolic CHF ([place])   . Dysphagia   . Dysphonia 02/20/2013  . Essential and other specified forms of tremor 02/20/2013  . HLD (hyperlipidemia)   . HTN (hypertension)   . OAB (overactive bladder)   . Osteoporosis   . Patient receiving subcutaneous heparin    For DVT prophylaxis 9/17  . Prediabetes   . Psoriasis   . Slow transit constipation   . Stroke (cerebrum) ([place]) 02/01/2016  . Trigeminal neuralgia 02/20/2013    Past Surgical History:  Procedure Laterality Date  . APPENDECTOMY  1940  . bladder tack    . CATARACT EXTRACTION Bilateral   . HEMORRHOID SURGERY  1960  . HUMERUS FRACTURE SURGERY    . LAPAROSCOPIC HYSTERECTOMY  2006  . torn rotator cuff    . WRIST FRACTURE SURGERY  2005   seconday shoulder 2001    There were no vitals filed for this visit.      Subjective Assessment - 09/08/16 1535    Subjective   I'm having a good day    Pertinent History adjustment disorder with depressed mood, diastolic CHF, osteoporosis, hx of fall, essential tremor, HTN, prediabetes, trigeminal neuralgia   Limitations fall risk   Patient Stated Goals be able to write, fold clothes, do dishes   Currently in Pain? No/denies           Treatment: standing to unload the top shelf of dishwasher, minguard-supervision for balance, 1 rest break required due to fatigue, pt was able to weightbear through RUE on countertop and she used it to assist with drying wet items. Standing, pt placed various sized pegs in semicircle with RUE, then she removed items in seated.                     OT Short Term Goals - 08/25/16 1544      OT SHORT TERM GOAL #1   Title -----------------     OT SHORT TERM GOAL #2   Title ----------------     OT SHORT TERM GOAL #3   Title --------------     OT SHORT TERM GOAL #4   Title -----------------------     OT SHORT TERM GOAL #5   Title Pt will be able to cut food mod I.--check updated STGs 08/29/16   Period Weeks   Status On-going  08/24/16:  improved and  is performing inconsistently     OT SHORT TERM GOAL #6   Title Pt will be able to write 2-3 sentences with 100% legibility.   Time 4   Period Weeks   Status On-going  08/25/16  approx 75% legibility today           OT Long Term Goals - 08/25/16 1456      OT LONG TERM GOAL #1   Title Pt will be independent with updated HEP--check updated LTGs 09/28/16   Time 8   Period Weeks   Status On-going  07/28/16:  met with current HEP, but would benefit from updates     OT LONG TERM GOAL #2   Title ---------------------------     OT LONG TERM GOAL #3   Title Pt will demo at least 110* R shoulder flex with min compensation in prep for functional reach.   Period Weeks   Status Achieved  07/28/16:  100* met initial goal.  met 08/25/16  at approx this level     OT LONG TERM GOAL #4   Title Pt will improve functional  reaching and grasp/release as shown by improving score on box and blocks to at least 25.  Revised 07/09/16:  as pt met initial goal of improving by 10   Baseline 3 blocks   Time 8   Period Weeks   Status On-going  07/09/16  Met, and revised goal;  07/30/16  17 blocks     OT LONG TERM GOAL #5   Title -------------------------------     OT LONG TERM GOAL #6   Title Pt will perform simple home maintenance tasks mod I.   Time 8   Period Weeks   Status Revised  07/28/16  met folded clothes, washed dishes (supervision) and revised goal     OT LONG TERM GOAL #7   Title Pt will demo improved coordination for ADLs as shown by completing 9-hole peg test in 41mn or less.   Time 8   Period Weeks   Status On-going  08/25/16:  placed 4 pegs in within 211m, completed in 50m50m37.79sec               Plan - 09/09/16 1702    Clinical Impression Statement Pt is progressing towards goals. She demonstrates improving performance of home management in standing.   OT Frequency 2x / week   OT Duration --  9 weeks   OT Treatment/Interventions Self-care/ADL training;Therapeutic exercise;DME and/or AE instruction;FunTherapist, nutritionalerapeutic activities;Patient/family education;Balance training;Neuromuscular education;Electrical Stimulation;Moist Heat;Energy conservation;Passive range of motion;Therapeutic exercises;Splinting;Manual Therapy;Fluidtherapy;Ultrasound;Cryotherapy;Parrafin   Plan neuro re-ed, IADLS in standing, consisder scheduling more visits after botox?   OT Home Exercise Plan Education provided:  06/05/15 Initial HEP (table slides with light wt. bearing, tracing, and flipping cards); 06/22/16 supine ball ex and thumb opposition   Consulted and Agree with Plan of Care Patient;Family member/caregiver   Family Member Consulted husband      Patient will benefit from skilled therapeutic intervention in order to improve the following deficits and impairments:  Decreased coordination,  Decreased activity tolerance, Decreased knowledge of use of DME, Decreased strength, Impaired UE functional use, Impaired tone, Decreased range of motion, Decreased balance, Decreased mobility, Difficulty walking  Visit Diagnosis: Hemiplegia and hemiparesis following cerebral infarction affecting right dominant side (HCC)  Muscle weakness (generalized)  Other symptoms and signs involving the nervous system  Other lack of coordination    Problem List Patient Active Problem List   Diagnosis Date Noted  . Slow  transit constipation   . Acute blood loss anemia   . Adjustment disorder with depressed mood   . Cerebrovascular accident (CVA) ([place]) 02/03/2016  . Dysarthria, post-stroke   . Dysphagia, post-stroke   . Leukocytosis   . Prediabetes   . Right hemiparesis ([place])   . Cerebral infarction due to unspecified mechanism   . Other secondary hypertension   . Overactive bladder   . Diastolic dysfunction   . Hyperlipidemia   . Essential hypertension   . Essential tremor   . Stroke (cerebrum) ([place]) 02/01/2016  . Facial droop due to stroke 02/01/2016  . Essential and other specified forms of tremor 02/20/2013  . Dysphonia 02/20/2013  . Trigeminal neuralgia 02/20/2013    Lera Gaines 09/09/2016, 5:06 PM  [facility] [address] [place], [place], [address] Phone: [phone]   Fax:  [phone]  Name: Kelly [name] MRN: [id number] Date of Birth: 24-Dec-1927

## 2016-09-11 ENCOUNTER — Ambulatory Visit: Payer: Medicare Other | Admitting: Physical Therapy

## 2016-09-11 ENCOUNTER — Ambulatory Visit: Payer: Medicare Other | Admitting: Occupational Therapy

## 2016-09-11 ENCOUNTER — Encounter: Payer: Self-pay | Admitting: Physical Therapy

## 2016-09-11 DIAGNOSIS — R2689 Other abnormalities of gait and mobility: Secondary | ICD-10-CM | POA: Diagnosis not present

## 2016-09-11 DIAGNOSIS — M6281 Muscle weakness (generalized): Secondary | ICD-10-CM | POA: Diagnosis not present

## 2016-09-11 DIAGNOSIS — R2681 Unsteadiness on feet: Secondary | ICD-10-CM

## 2016-09-11 DIAGNOSIS — R29818 Other symptoms and signs involving the nervous system: Secondary | ICD-10-CM | POA: Diagnosis not present

## 2016-09-11 DIAGNOSIS — R278 Other lack of coordination: Secondary | ICD-10-CM

## 2016-09-11 DIAGNOSIS — I69351 Hemiplegia and hemiparesis following cerebral infarction affecting right dominant side: Secondary | ICD-10-CM

## 2016-09-11 NOTE — Therapy (Signed)
[facility] [address] [place], [place], [address] Phone: [phone]   Fax:  [phone]  Physical Therapy Treatment  Patient Details  Name: Kelly [name] MRN: [id number] Date of Birth: 05/01/1928 Referring Provider: Cecille Rubin  Encounter Date: 09/11/2016      PT End of Session - 09/11/16 1408    Visit Number 24   Number of Visits 36   Date for PT Re-Evaluation 09/25/16   Authorization Type Medicare/[employer] every 10th visit   Authorization Time Period 07/30/16- 09/25/16   PT Start Time 1405   PT Stop Time 1445   PT Time Calculation (min) 40 min   Equipment Utilized During Treatment Gait belt   Activity Tolerance Patient tolerated treatment well   Behavior During Therapy [facility] for tasks assessed/performed      Past Medical History:  Diagnosis Date  . Acute blood loss anemia   . Adjustment disorder with depressed mood   . CVA (cerebral vascular accident) ([place]) 02/03/2016   Linear infarct within the posterior limb of left internal capsule  . Diastolic CHF ([place])   . Dysphagia   . Dysphonia 02/20/2013  . Essential and other specified forms of tremor 02/20/2013  . HLD (hyperlipidemia)   . HTN (hypertension)   . OAB (overactive bladder)   . Osteoporosis   . Patient receiving subcutaneous heparin    For DVT prophylaxis 9/17  . Prediabetes   . Psoriasis   . Slow transit constipation   . Stroke (cerebrum) ([place]) 02/01/2016  . Trigeminal neuralgia 02/20/2013    Past Surgical History:  Procedure Laterality Date  . APPENDECTOMY  1940  . bladder tack    . CATARACT EXTRACTION Bilateral   . HEMORRHOID SURGERY  1960  . HUMERUS FRACTURE SURGERY    . LAPAROSCOPIC HYSTERECTOMY  2006  . torn rotator cuff    . WRIST FRACTURE SURGERY  2005   seconday shoulder 2001    There were no vitals filed for this visit.      Subjective Assessment - 09/11/16 1407    Subjective No new complaints. No new falls or  pain to report. Does report her right fingers are tired after OT prior to this PT session.   Patient is accompained by: Family member  [name]-spouse   Patient Stated Goals Pt's goal for therapy is to walk again.   Currently in Pain? No/denies   Pain Score 0-No pain              [facility] Adult PT Treatment/Exercise - 09/11/16 1410      Transfers   Transfers Sit to Stand;Stand to Sit   Sit to Stand 5: Supervision;With upper extremity assist;From bed   Stand to Sit 5: Supervision;With upper extremity assist;To bed     Ambulation/Gait   Ambulation/Gait Yes   Ambulation/Gait Assistance 4: Min guard;5: Supervision   Ambulation Distance (Feet) 385 Feet   Assistive device Rolling walker   Gait Pattern Step-through pattern;Decreased stride length;Decreased dorsiflexion - right;Right genu recurvatum;Poor foot clearance - right   Ambulation Surface Level;Indoor;Unlevel;Outdoor;Paved     Knee/Hip Exercises: Seated   Long Arc Quad Strengthening;Both;1 set;10 reps;Weights   Long Arc Quad Weight 3 lbs.   Long [employer] Limitations 5 sec hold with each rep   Hamstring Curl Strengthening;Right;2 sets;10 reps;Limitations   Hamstring Limitations with red band- 5 sec holds before releasing and bringing foot back up     Ankle Exercises: Seated   Other Seated Ankle Exercises red band resisted  DF, 2 sets x 10 reps with cues on form             PT Short Term Goals - 08/14/16 1912      PT SHORT TERM GOAL #1   Title Pt will perform HEP with family's supervision, to address balance, gait, transfers and strengthening.  TARGET 07/01/16 (NEW TARGET DATE FOR ALL CURRENT STGs 08/30/16)   Baseline 06/29/16: met per pt and spouse report   Status Achieved     PT SHORT TERM GOAL #2   Title Pt will improve Berg Balance score to at least 26/56 for decreased fall risk.   Baseline 06/29/16: 30/56 scored today   Status Achieved     PT SHORT TERM GOAL #3   Title Pt will improve TUG score to less than or equal to  50 seconds for decreased fall risk.   Baseline 06/29/16: 23.81 with RW   Status Achieved     PT SHORT TERM GOAL #4   Title Pt will transfer sit<>stand, modified independently, at least 4 of 5 trials, to demo improved lower extremity strength and transfer efficiency.   Baseline 06/29/16: pt at supervision level with cues for safety/sequencing needed at times   Status Partially Met     PT SHORT TERM GOAL #5   Title Pt will ambulate at least 200 ft using RW, supervision, for improved gait safety and efficiency.   Baseline 06/29/16: met today with RW   Status Achieved     PT SHORT TERM GOAL #6   Title Pt/family will verbalize understanding of CVA education.   Time 4   Period Weeks   Status On-going           PT Long Term Goals - 08/14/16 1913      PT LONG TERM GOAL #1   Title Pt/family will verbalize understanding of fall prevention in the home environment.  TARGET 07/31/16 (NEW TARGET DATE FOR ALL CURRENT LTGS 09/25/16)   Time 8   Period Weeks   Status On-going     PT LONG TERM GOAL #2   Title Pt will improve Berg Balance score to at least 31/56 for decreased fall risk.   Time 8   Period Weeks   Status On-going     PT LONG TERM GOAL #3   Title Pt will improve TUG score to less than or equal to 45 seconds for decreased fall risk.   Baseline 33.41 sec 07/28/16   Time 8   Period Weeks   Status Achieved     PT LONG TERM GOAL #4   Title Pt will improve gait velocity to at least 1.8 ft/sec for improved gait efficiency and safety.   Baseline 1.56 ft/sec with RW 07/28/16   Time 8   Period Weeks   Status Achieved     PT LONG TERM GOAL #5   Title Pt will ambulate at least 800 ft indoor and outdoor surfaces, using RW, modified independently, for improved outdoor and community.   Time 8   Period Weeks   Status On-going     PT LONG TERM GOAL #6   Title Pt will improve Neuro QOL score on FOTO by at least 20% for improved functional mobility.   Time 8   Period Weeks   Status On-going             Plan - 09/11/16 1409    Clinical Impression Statement Today's skilled session focused on gait and LE strengthening without any issues reported. Pt  continues to make progress toward goals and should benefit from continued PT to progress toward unmet goals.    Rehab Potential Good   PT Frequency 2x / week   PT Duration 8 weeks   PT Treatment/Interventions ADLs/Self Care Home Management;Functional mobility training;Stair training;Gait training;Patient/family education;Neuromuscular re-education;Balance training;Therapeutic exercise;Therapeutic activities;DME Instruction   PT Next Visit Plan  has goal to walk 800 ft indoor/outdoor (? go outdoors); continue to work on transfers from soft/lower surfaces and safe hand placement; continue to work on standing balance; continue to work on knee strengthening/control in Building services engineer and Agree with Plan of Care Patient;Family member/caregiver   Family Member Consulted Husband      Patient will benefit from skilled therapeutic intervention in order to improve the following deficits and impairments:  Abnormal gait, Decreased balance, Decreased cognition, Decreased knowledge of use of DME, Decreased range of motion, Decreased safety awareness, Decreased strength  Visit Diagnosis: Hemiplegia and hemiparesis following cerebral infarction affecting right dominant side (HCC)  Other abnormalities of gait and mobility  Unsteadiness on feet  Muscle weakness (generalized)     Problem List Patient Active Problem List   Diagnosis Date Noted  . Slow transit constipation   . Acute blood loss anemia   . Adjustment disorder with depressed mood   . Cerebrovascular accident (CVA) ([place]) 02/03/2016  . Dysarthria, post-stroke   . Dysphagia, post-stroke   . Leukocytosis   . Prediabetes   . Right hemiparesis ([place])   . Cerebral infarction due to unspecified mechanism   . Other secondary hypertension   . Overactive bladder    . Diastolic dysfunction   . Hyperlipidemia   . Essential hypertension   . Essential tremor   . Stroke (cerebrum) ([place]) 02/01/2016  . Facial droop due to stroke 02/01/2016  . Essential and other specified forms of tremor 02/20/2013  . Dysphonia 02/20/2013  . Trigeminal neuralgia 02/20/2013    Willow Ora, PTA, [facility] [address], [place], [place] [address] [phone] 09/11/16, 5:11 PM   Name: Kelly [name] MRN: [id number] Date of Birth: 1927/12/20

## 2016-09-11 NOTE — Therapy (Signed)
[facility] [address] [place], [place], [address] Phone: [phone]   Fax:  [phone]  Occupational Therapy Treatment  Patient Details  Name: Kelly [name] MRN: [id number] Date of Birth: 1928-04-17 Referring Provider: Cecille Rubin, NP  Encounter Date: 09/11/2016      OT End of Session - 09/11/16 1358    Visit Number 25   Number of Visits 30   Date for OT Re-Evaluation 09/28/16   Authorization Type Medicare / [employer] (g-code needed)   Authorization Time Period renewal completed 07/30/16   Authorization - Visit Number 25   Authorization - Number of Visits 30   OT Start Time 1321   OT Stop Time 1400   OT Time Calculation (min) 39 min   Activity Tolerance Patient tolerated treatment well   Behavior During Therapy [facility] for tasks assessed/performed      Past Medical History:  Diagnosis Date  . Acute blood loss anemia   . Adjustment disorder with depressed mood   . CVA (cerebral vascular accident) ([place]) 02/03/2016   Linear infarct within the posterior limb of left internal capsule  . Diastolic CHF ([place])   . Dysphagia   . Dysphonia 02/20/2013  . Essential and other specified forms of tremor 02/20/2013  . HLD (hyperlipidemia)   . HTN (hypertension)   . OAB (overactive bladder)   . Osteoporosis   . Patient receiving subcutaneous heparin    For DVT prophylaxis 9/17  . Prediabetes   . Psoriasis   . Slow transit constipation   . Stroke (cerebrum) ([place]) 02/01/2016  . Trigeminal neuralgia 02/20/2013    Past Surgical History:  Procedure Laterality Date  . APPENDECTOMY  1940  . bladder tack    . CATARACT EXTRACTION Bilateral   . HEMORRHOID SURGERY  1960  . HUMERUS FRACTURE SURGERY    . LAPAROSCOPIC HYSTERECTOMY  2006  . torn rotator cuff    . WRIST FRACTURE SURGERY  2005   seconday shoulder 2001    There were no vitals filed for this visit.      Subjective Assessment - 09/11/16 1558    Subjective   Denies pain   Pertinent History adjustment disorder with depressed mood, diastolic CHF, osteoporosis, hx of fall, essential tremor, HTN, prediabetes, trigeminal neuralgia   Limitations fall risk   Patient Stated Goals be able to write, fold clothes, do dishes   Currently in Pain? No/denies        Treatment: Seated midrange closed chain shoulder flexion, and  Horizontal abduction, min v.c.. Standing to fold laundry with close supervision-minguard and min-mod v.c. Placing pieces in concentration framework with RUE, mod-max difficulty. Standing to place red and yellow clothespins on vertical antennae with RUe for sustained pinch, min v.c., close supervision                        OT Short Term Goals - 08/25/16 1544      OT SHORT TERM GOAL #1   Title -----------------     OT SHORT TERM GOAL #2   Title ----------------     OT SHORT TERM GOAL #3   Title --------------     OT SHORT TERM GOAL #4   Title -----------------------     OT SHORT TERM GOAL #5   Title Pt will be able to cut food mod I.--check updated STGs 08/29/16   Period Weeks   Status On-going  08/24/16:  improved and is performing inconsistently  OT SHORT TERM GOAL #6   Title Pt will be able to write 2-3 sentences with 100% legibility.   Time 4   Period Weeks   Status On-going  08/25/16  approx 75% legibility today           OT Long Term Goals - 08/25/16 1456      OT LONG TERM GOAL #1   Title Pt will be independent with updated HEP--check updated LTGs 09/28/16   Time 8   Period Weeks   Status On-going  07/28/16:  met with current HEP, but would benefit from updates     OT LONG TERM GOAL #2   Title ---------------------------     OT LONG TERM GOAL #3   Title Pt will demo at least 110* R shoulder flex with min compensation in prep for functional reach.   Period Weeks   Status Achieved  07/28/16:  100* met initial goal.  met 08/25/16  at approx this level     OT LONG TERM GOAL #4   Title Pt  will improve functional reaching and grasp/release as shown by improving score on box and blocks to at least 25.  Revised 07/09/16:  as pt met initial goal of improving by 10   Baseline 3 blocks   Time 8   Period Weeks   Status On-going  07/09/16  Met, and revised goal;  07/30/16  17 blocks     OT LONG TERM GOAL #5   Title -------------------------------     OT LONG TERM GOAL #6   Title Pt will perform simple home maintenance tasks mod I.   Time 8   Period Weeks   Status Revised  07/28/16  met folded clothes, washed dishes (supervision) and revised goal     OT LONG TERM GOAL #7   Title Pt will demo improved coordination for ADLs as shown by completing 9-hole peg test in 27mn or less.   Time 8   Period Weeks   Status On-going  08/25/16:  placed 4 pegs in within 248m, completed in 60m30m37.79sec               Plan - 09/11/16 1353    Clinical Impression Statement Pt is progressing towards goals. she demonstrates improving RUE functional use and standing balance  for home management.   Rehab Potential Good   OT Frequency 2x / week   OT Duration --  9 weeks   OT Treatment/Interventions Self-care/ADL training;Therapeutic exercise;DME and/or AE instruction;FunTherapist, nutritionalerapeutic activities;Patient/family education;Balance training;Neuromuscular education;Electrical Stimulation;Moist Heat;Energy conservation;Passive range of motion;Therapeutic exercises;Splinting;Manual Therapy;Fluidtherapy;Ultrasound;Cryotherapy;Parrafin   Plan discuss plans after botox? ( pt currently has 2 more weeks of therapy scheduled)   OT Home Exercise Plan Education provided:  06/05/15 Initial HEP (table slides with light wt. bearing, tracing, and flipping cards); 06/22/16 supine ball ex and thumb opposition   Consulted and Agree with Plan of Care Patient   Family Member Consulted husband      Patient will benefit from skilled therapeutic intervention in order to improve the following deficits  and impairments:  Decreased coordination, Decreased activity tolerance, Decreased knowledge of use of DME, Decreased strength, Impaired UE functional use, Impaired tone, Decreased range of motion, Decreased balance, Decreased mobility, Difficulty walking  Visit Diagnosis: Hemiplegia and hemiparesis following cerebral infarction affecting right dominant side (HCC)  Other abnormalities of gait and mobility  Unsteadiness on feet  Muscle weakness (generalized)  Other lack of coordination  Other symptoms and signs involving the nervous system  Problem List Patient Active Problem List   Diagnosis Date Noted  . Slow transit constipation   . Acute blood loss anemia   . Adjustment disorder with depressed mood   . Cerebrovascular accident (CVA) ([place]) 02/03/2016  . Dysarthria, post-stroke   . Dysphagia, post-stroke   . Leukocytosis   . Prediabetes   . Right hemiparesis ([place])   . Cerebral infarction due to unspecified mechanism   . Other secondary hypertension   . Overactive bladder   . Diastolic dysfunction   . Hyperlipidemia   . Essential hypertension   . Essential tremor   . Stroke (cerebrum) ([place]) 02/01/2016  . Facial droop due to stroke 02/01/2016  . Essential and other specified forms of tremor 02/20/2013  . Dysphonia 02/20/2013  . Trigeminal neuralgia 02/20/2013    Lama Narayanan 09/11/2016, 3:59 PM  [facility] [address] [place], [place], [address] Phone: [phone]   Fax:  [phone]  Name: Kelly [name] MRN: [id number] Date of Birth: 1928/03/18

## 2016-09-14 ENCOUNTER — Ambulatory Visit: Payer: Medicare Other | Admitting: Physical Therapy

## 2016-09-14 ENCOUNTER — Encounter: Payer: Self-pay | Admitting: Physical Therapy

## 2016-09-14 ENCOUNTER — Ambulatory Visit: Payer: Medicare Other | Admitting: Occupational Therapy

## 2016-09-14 DIAGNOSIS — R2681 Unsteadiness on feet: Secondary | ICD-10-CM

## 2016-09-14 DIAGNOSIS — M6281 Muscle weakness (generalized): Secondary | ICD-10-CM | POA: Diagnosis not present

## 2016-09-14 DIAGNOSIS — R29818 Other symptoms and signs involving the nervous system: Secondary | ICD-10-CM

## 2016-09-14 DIAGNOSIS — R278 Other lack of coordination: Secondary | ICD-10-CM | POA: Diagnosis not present

## 2016-09-14 DIAGNOSIS — R2689 Other abnormalities of gait and mobility: Secondary | ICD-10-CM

## 2016-09-14 DIAGNOSIS — I69351 Hemiplegia and hemiparesis following cerebral infarction affecting right dominant side: Secondary | ICD-10-CM

## 2016-09-14 NOTE — Therapy (Signed)
[facility] [address] [place], [place], [address] Phone: [phone]   Fax:  [phone]  Occupational Therapy Treatment  Patient Details  Name: Kelly [name] MRN: [id number] Date of Birth: 1928-01-14 Referring Provider: Cecille Rubin, NP  Encounter Date: 09/14/2016      OT End of Session - 09/14/16 1417    Visit Number 26   Number of Visits 30   Date for OT Re-Evaluation 09/28/16   Authorization Type Medicare / [employer] (g-code needed)   Authorization Time Period renewal completed 07/30/16   Authorization - Visit Number 26   Authorization - Number of Visits 30   OT Start Time 1407   OT Stop Time 1450   OT Time Calculation (min) 43 min   Activity Tolerance Patient tolerated treatment well   Behavior During Therapy [facility] for tasks assessed/performed      Past Medical History:  Diagnosis Date  . Acute blood loss anemia   . Adjustment disorder with depressed mood   . CVA (cerebral vascular accident) ([place]) 02/03/2016   Linear infarct within the posterior limb of left internal capsule  . Diastolic CHF ([place])   . Dysphagia   . Dysphonia 02/20/2013  . Essential and other specified forms of tremor 02/20/2013  . HLD (hyperlipidemia)   . HTN (hypertension)   . OAB (overactive bladder)   . Osteoporosis   . Patient receiving subcutaneous heparin    For DVT prophylaxis 9/17  . Prediabetes   . Psoriasis   . Slow transit constipation   . Stroke (cerebrum) ([place]) 02/01/2016  . Trigeminal neuralgia 02/20/2013    Past Surgical History:  Procedure Laterality Date  . APPENDECTOMY  1940  . bladder tack    . CATARACT EXTRACTION Bilateral   . HEMORRHOID SURGERY  1960  . HUMERUS FRACTURE SURGERY    . LAPAROSCOPIC HYSTERECTOMY  2006  . torn rotator cuff    . WRIST FRACTURE SURGERY  2005   seconday shoulder 2001    There were no vitals filed for this visit.      Subjective Assessment - 09/14/16 1415    Subjective   I've been doing the dishes   Pertinent History adjustment disorder with depressed mood, diastolic CHF, osteoporosis, hx of fall, essential tremor, HTN, prediabetes, trigeminal neuralgia   Limitations fall risk   Patient Stated Goals be able to write, fold clothes, do dishes   Currently in Pain? No/denies      Placing various sized pegs in arc pegboard with in sitting and then standing (for low range reach due to shoulder fatigue) and min-mod difficulty with reach/coordination.    RUE AAROM Supination with roller on table for shoulder ER/supination with min cueing.  Wrist ext  with difficulty due to incr spasticity today, min cues/facilitation.  Finger abduction/adduction with min cues/difficulty.  Opposition to each digit.  Picking up coins with RUE by dragging off edge to put in container with min cueing for incr elbow ext.  In standing, retrieving cones from low surface with UE support (standing at table) to simulate loading/unloading dishwasher with RUE.  Pt unable to use cylinder grasp and demo min difficulty with grasp.  Good control with balance using 1 UE support.  Then functional grasp of cylinder objects and performing cross body functional reaching with min cueing for compensation.    Began discussing progress and continuing after Botox.  Pt/husband verbalized agreement.  OT Short Term Goals - 08/25/16 1544      OT SHORT TERM GOAL #1   Title -----------------     OT SHORT TERM GOAL #2   Title ----------------     OT SHORT TERM GOAL #3   Title --------------     OT SHORT TERM GOAL #4   Title -----------------------     OT SHORT TERM GOAL #5   Title Pt will be able to cut food mod I.--check updated STGs 08/29/16   Period Weeks   Status On-going  08/24/16:  improved and is performing inconsistently     OT SHORT TERM GOAL #6   Title Pt will be able to write 2-3 sentences with 100% legibility.   Time 4   Period Weeks    Status On-going  08/25/16  approx 75% legibility today           OT Long Term Goals - 09/14/16 1609      OT LONG TERM GOAL #1   Title Pt will be independent with updated HEP--check updated LTGs 09/28/16   Time 8   Period Weeks   Status On-going  07/28/16:  met with current HEP, but would benefit from updates     OT LONG TERM GOAL #2   Title ---------------------------     OT LONG TERM GOAL #3   Title Pt will demo at least 110* R shoulder flex with min compensation in prep for functional reach.   Period Weeks   Status Achieved  07/28/16:  100* met initial goal.  met 08/25/16  at approx this level     OT LONG TERM GOAL #4   Title Pt will improve functional reaching and grasp/release as shown by improving score on box and blocks to at least 25.  Revised 07/09/16:  as pt met initial goal of improving by 10   Baseline 3 blocks   Time 8   Period Weeks   Status On-going  07/09/16  Met, and revised goal;  07/30/16  17 blocks     OT LONG TERM GOAL #5   Title -------------------------------     OT LONG TERM GOAL #6   Title Pt will perform simple home maintenance tasks mod I.   Time 8   Period Weeks   Status Achieved  07/28/16  met folded clothes, washed dishes (supervision) and revised goal.  09/14/16  washing dishes, folding clothes     OT LONG TERM GOAL #7   Title Pt will demo improved coordination for ADLs as shown by completing 9-hole peg test in 51mn or less.   Time 8   Period Weeks   Status On-going  08/25/16:  placed 4 pegs in within 222m, completed in 35m11m37.79sec               Plan - 09/14/16 1418    Clinical Impression Statement Pt continues to demo improvement with IADL performance and functional mobility.  However, increasing spasticity in RUE is a limiting factor (Botox scheduled again later this week) affecting performance and ability to perform repetitive activities.   Rehab Potential Good   OT Frequency 2x / week   OT Duration --  9 weeks   OT  Treatment/Interventions Self-care/ADL training;Therapeutic exercise;DME and/or AE instruction;FunTherapist, nutritionalerapeutic activities;Patient/family education;Balance training;Neuromuscular education;Electrical Stimulation;Moist Heat;Energy conservation;Passive range of motion;Therapeutic exercises;Splinting;Manual Therapy;Fluidtherapy;Ultrasound;Cryotherapy;Parrafin   Plan continue with LUE functional use, reaching, coordination   OT Home Exercise Plan Education provided:  06/05/15 Initial HEP (table slides with light wt. bearing, tracing, and  flipping cards); 06/22/16 supine ball ex and thumb opposition   Consulted and Agree with Plan of Care Patient   Family Member Consulted husband      Patient will benefit from skilled therapeutic intervention in order to improve the following deficits and impairments:  Decreased coordination, Decreased activity tolerance, Decreased knowledge of use of DME, Decreased strength, Impaired UE functional use, Impaired tone, Decreased range of motion, Decreased balance, Decreased mobility, Difficulty walking  Visit Diagnosis: Hemiplegia and hemiparesis following cerebral infarction affecting right dominant side (HCC)  Other abnormalities of gait and mobility  Unsteadiness on feet  Muscle weakness (generalized)  Other lack of coordination  Other symptoms and signs involving the nervous system    Problem List Patient Active Problem List   Diagnosis Date Noted  . Slow transit constipation   . Acute blood loss anemia   . Adjustment disorder with depressed mood   . Cerebrovascular accident (CVA) ([place]) 02/03/2016  . Dysarthria, post-stroke   . Dysphagia, post-stroke   . Leukocytosis   . Prediabetes   . Right hemiparesis ([place])   . Cerebral infarction due to unspecified mechanism   . Other secondary hypertension   . Overactive bladder   . Diastolic dysfunction   . Hyperlipidemia   . Essential hypertension   . Essential tremor   . Stroke  (cerebrum) ([place]) 02/01/2016  . Facial droop due to stroke 02/01/2016  . Essential and other specified forms of tremor 02/20/2013  . Dysphonia 02/20/2013  . Trigeminal neuralgia 02/20/2013    [facility] 09/14/2016, 4:12 PM  [facility] [address] [place], [place], [address] Phone: [phone]   Fax:  [phone]  Name: MEKENNA FINAU MRN: [id number] Date of Birth: [birth year]   Vianne Bulls, OTR/L [facility] [address] [place], [place]  [address] [phone] phone [phone] 09/14/16 4:12 PM

## 2016-09-14 NOTE — Therapy (Signed)
[facility] [address] [place], [place], [address] Phone: [phone]   Fax:  [phone]  Physical Therapy Treatment  Patient Details  Name: Kelly [name] MRN: [id number] Date of Birth: [birth year] Referring Provider: Cecille Rubin  Encounter Date: 09/14/2016      PT End of Session - 09/14/16 1555    Visit Number 25   Number of Visits 36   Date for PT Re-Evaluation 09/25/16   Authorization Type Medicare/[employer] every 10th visit   Authorization Time Period 07/30/16- 09/25/16   PT Start Time 1450   PT Stop Time 1532   PT Time Calculation (min) 42 min   Activity Tolerance Patient tolerated treatment well   Behavior During Therapy [facility] for tasks assessed/performed      Past Medical History:  Diagnosis Date  . Acute blood loss anemia   . Adjustment disorder with depressed mood   . CVA (cerebral vascular accident) ([place]) 02/03/2016   Linear infarct within the posterior limb of left internal capsule  . Diastolic CHF ([place])   . Dysphagia   . Dysphonia 02/20/2013  . Essential and other specified forms of tremor 02/20/2013  . HLD (hyperlipidemia)   . HTN (hypertension)   . OAB (overactive bladder)   . Osteoporosis   . Patient receiving subcutaneous heparin    For DVT prophylaxis 9/17  . Prediabetes   . Psoriasis   . Slow transit constipation   . Stroke (cerebrum) ([place]) 02/01/2016  . Trigeminal neuralgia 02/20/2013    Past Surgical History:  Procedure Laterality Date  . APPENDECTOMY  1940  . bladder tack    . CATARACT EXTRACTION Bilateral   . HEMORRHOID SURGERY  1960  . HUMERUS FRACTURE SURGERY    . LAPAROSCOPIC HYSTERECTOMY  2006  . torn rotator cuff    . WRIST FRACTURE SURGERY  2005   seconday shoulder 2001    There were no vitals filed for this visit.      Subjective Assessment - 09/14/16 1540    Subjective reports she walked outside yesterday x 100 yds. Her and husband are going to start  going to [facility] for short walks.    Patient is accompained by: Family member  [name]-spouse   Patient Stated Goals Pt's goal for therapy is to walk again.   Currently in Pain? No/denies                         [facility] Adult PT Treatment/Exercise - 09/14/16 1544      Transfers   Transfers Sit to Stand;Stand to Sit   Sit to Stand 5: Supervision;With upper extremity assist;From bed   Sit to Stand Details Verbal cues for safe use of DME/AE   Stand to Sit 5: Supervision;With upper extremity assist;To bed   Stand to Sit Details (indicate cue type and reason) Verbal cues for safe use of DME/AE   Number of Reps Other reps (comment)  5     Ambulation/Gait   Ambulation/Gait Assistance 4: Min guard;5: Supervision   Ambulation/Gait Assistance Details vc for upright posture, maintaining grip with Rt hand, and attempting to control rt knee   Ambulation Distance (Feet) 100 Feet  100   Assistive device Rolling walker;Parallel bars   Gait Pattern Step-through pattern;Decreased stride length;Decreased dorsiflexion - right;Right genu recurvatum;Poor foot clearance - right   Ambulation Surface Level;Indoor   Pre-Gait Activities in // bars forward wt-shift onto RLE with no genu recurvatum; intially max assist to  control knee, as pt repeated  she began to need less and less assist until able to do consistently with supervision      Exercises   Other Exercises  seated leg press with green theraband around pt's hips to rt foot; pt with difficulty understanding technique with band nearly slipping off her foot; AAROM (to guide motion) x 10     Lumbar Exercises: Machines for Strengthening   Leg Press 30# bil LE's x 20; RLE only 30# x 25 reps x 2 sets with focus on Rt knee control     Ankle Exercises: Seated   Toe Raise 20 reps   Other Seated Ankle Exercises yellow band with good ROM                PT Education - 09/14/16 1554    Education provided Yes   Education Details  resume resisted Rt ankle DF with yellow band   Person(s) Educated Patient;Spouse   Methods Explanation;Demonstration;Verbal cues   Comprehension Verbalized understanding;Returned demonstration          PT Short Term Goals - 08/14/16 1912      PT SHORT TERM GOAL #1   Title Pt will perform HEP with family's supervision, to address balance, gait, transfers and strengthening.  TARGET 07/01/16 (NEW TARGET DATE FOR ALL CURRENT STGs 08/30/16)   Baseline 06/29/16: met per pt and spouse report   Status Achieved     PT SHORT TERM GOAL #2   Title Pt will improve Berg Balance score to at least 26/56 for decreased fall risk.   Baseline 06/29/16: 30/56 scored today   Status Achieved     PT SHORT TERM GOAL #3   Title Pt will improve TUG score to less than or equal to 50 seconds for decreased fall risk.   Baseline 06/29/16: 23.81 with RW   Status Achieved     PT SHORT TERM GOAL #4   Title Pt will transfer sit<>stand, modified independently, at least 4 of 5 trials, to demo improved lower extremity strength and transfer efficiency.   Baseline 06/29/16: pt at supervision level with cues for safety/sequencing needed at times   Status Partially Met     PT SHORT TERM GOAL #5   Title Pt will ambulate at least 200 ft using RW, supervision, for improved gait safety and efficiency.   Baseline 06/29/16: met today with RW   Status Achieved     PT SHORT TERM GOAL #6   Title Pt/family will verbalize understanding of CVA education.   Time 4   Period Weeks   Status On-going           PT Long Term Goals - 08/14/16 1913      PT LONG TERM GOAL #1   Title Pt/family will verbalize understanding of fall prevention in the home environment.  TARGET 07/31/16 (NEW TARGET DATE FOR ALL CURRENT LTGS 09/25/16)   Time 8   Period Weeks   Status On-going     PT LONG TERM GOAL #2   Title Pt will improve Berg Balance score to at least 31/56 for decreased fall risk.   Time 8   Period Weeks   Status On-going     PT LONG TERM  GOAL #3   Title Pt will improve TUG score to less than or equal to 45 seconds for decreased fall risk.   Baseline 33.41 sec 07/28/16   Time 8   Period Weeks   Status Achieved     PT LONG TERM  GOAL #4   Title Pt will improve gait velocity to at least 1.8 ft/sec for improved gait efficiency and safety.   Baseline 1.56 ft/sec with RW 07/28/16   Time 8   Period Weeks   Status Achieved     PT LONG TERM GOAL #5   Title Pt will ambulate at least 800 ft indoor and outdoor surfaces, using RW, modified independently, for improved outdoor and community.   Time 8   Period Weeks   Status On-going     PT LONG TERM GOAL #6   Title Pt will improve Neuro QOL score on FOTO by at least 20% for improved functional mobility.   Time 8   Period Weeks   Status On-going               Plan - 09/14/16 1556    Clinical Impression Statement Skilled session focused on RLE strengthening, pre-gait Rt knee control, and gait training. Patient demonstrated significantly improved Rt ankle DF this date, however continues with genu recurvatum.    Rehab Potential Good   PT Frequency 2x / week   PT Duration 8 weeks   PT Treatment/Interventions ADLs/Self Care Home Management;Functional mobility training;Stair training;Gait training;Patient/family education;Neuromuscular re-education;Balance training;Therapeutic exercise;Therapeutic activities;DME Instruction   PT Next Visit Plan  has goal to walk 800 ft indoor/outdoor (? go outdoors); continue to work on transfers from soft/lower surfaces and safe hand placement; continue to work on standing balance; continue to work on knee strengthening/control in stance ; did well on leg press; **plan to extend to end of May when time               Consulted and Agree with Plan of Care Patient;Family member/caregiver   Family Member Consulted Husband      Patient will benefit from skilled therapeutic intervention in order to improve the following deficits and impairments:   Abnormal gait, Decreased balance, Decreased cognition, Decreased knowledge of use of DME, Decreased range of motion, Decreased safety awareness, Decreased strength  Visit Diagnosis: Other abnormalities of gait and mobility  Unsteadiness on feet  Muscle weakness (generalized)     Problem List Patient Active Problem List   Diagnosis Date Noted  . Slow transit constipation   . Acute blood loss anemia   . Adjustment disorder with depressed mood   . Cerebrovascular accident (CVA) ([facility]) 02/03/2016  . Dysarthria, post-stroke   . Dysphagia, post-stroke   . Leukocytosis   . Prediabetes   . Right hemiparesis ([facility])   . Cerebral infarction due to unspecified mechanism   . Other secondary hypertension   . Overactive bladder   . Diastolic dysfunction   . Hyperlipidemia   . Essential hypertension   . Essential tremor   . Stroke (cerebrum) ([facility]) 02/01/2016  . Facial droop due to stroke 02/01/2016  . Essential and other specified forms of tremor 02/20/2013  . Dysphonia 02/20/2013  . Trigeminal neuralgia 02/20/2013    Lynn P Sasser, PT 09/14/2016, 4:00 PM  [facility] [address] [place], [place], [address] Phone: [phone]   Fax:  [phone]  Name: Kawanna H Rolland MRN: [id number] Date of Birth: 08/26/1927   

## 2016-09-17 ENCOUNTER — Encounter: Payer: Medicare Other | Attending: Physical Medicine & Rehabilitation | Admitting: Physical Medicine & Rehabilitation

## 2016-09-17 ENCOUNTER — Encounter: Payer: Self-pay | Admitting: Physical Medicine & Rehabilitation

## 2016-09-17 VITALS — BP 121/79 | HR 65 | Resp 14

## 2016-09-17 DIAGNOSIS — I69351 Hemiplegia and hemiparesis following cerebral infarction affecting right dominant side: Secondary | ICD-10-CM | POA: Diagnosis not present

## 2016-09-17 DIAGNOSIS — I110 Hypertensive heart disease with heart failure: Secondary | ICD-10-CM | POA: Diagnosis not present

## 2016-09-17 DIAGNOSIS — M810 Age-related osteoporosis without current pathological fracture: Secondary | ICD-10-CM | POA: Diagnosis not present

## 2016-09-17 DIAGNOSIS — I639 Cerebral infarction, unspecified: Secondary | ICD-10-CM | POA: Diagnosis not present

## 2016-09-17 DIAGNOSIS — R7303 Prediabetes: Secondary | ICD-10-CM | POA: Diagnosis not present

## 2016-09-17 DIAGNOSIS — L409 Psoriasis, unspecified: Secondary | ICD-10-CM | POA: Diagnosis not present

## 2016-09-17 DIAGNOSIS — N3281 Overactive bladder: Secondary | ICD-10-CM | POA: Insufficient documentation

## 2016-09-17 DIAGNOSIS — F4321 Adjustment disorder with depressed mood: Secondary | ICD-10-CM | POA: Diagnosis not present

## 2016-09-17 DIAGNOSIS — G250 Essential tremor: Secondary | ICD-10-CM | POA: Diagnosis not present

## 2016-09-17 DIAGNOSIS — E785 Hyperlipidemia, unspecified: Secondary | ICD-10-CM | POA: Insufficient documentation

## 2016-09-17 DIAGNOSIS — G8111 Spastic hemiplegia affecting right dominant side: Secondary | ICD-10-CM

## 2016-09-17 DIAGNOSIS — I5030 Unspecified diastolic (congestive) heart failure: Secondary | ICD-10-CM | POA: Insufficient documentation

## 2016-09-17 DIAGNOSIS — IMO0002 Reserved for concepts with insufficient information to code with codable children: Secondary | ICD-10-CM

## 2016-09-17 NOTE — Progress Notes (Signed)
Botox: Procedure Note Patient Name: Kelly [name] DOB: June 09, 1927 MRN: [id number]   Procedure: Botulinum toxin administration Guidance: EMG Diagnosis: G81.11 Date: 06/18/16 Attending: Maryla Morrow, MD    Trade name: Botox (onabotulinumtoxinA)  Informed consent: Risks, benefits & options of the procedure are explained to the patient (and/or family). The patient elects to proceed with procedure. Risks include but are not limited to weakness, respiratory distress, dry mouth, ptosis, antibody formation, worsening of some areas of function. Benefits include decreased abnormal muscle tone, improved hygiene and positioning, decreased skin breakdown and, in some cases, decreased pain. Options include conservative management with oral antispasticity agents, phenol chemodenervation of nerve or at motor nerve branches. More invasive options include intrathecal balcofen adminstration for appropriate candidates. Surgical options may include tendon lengthening or transposition or, rarely, dorsal rhizotomy.   History/Physical Examination: 83 y.o. spastic hemiplegia affecting right dominant side after left internal capsule CVA.  mAS  Right elbow flexors 1+/4   Right wrist flexors 2/4   Right finger flexors 1+/4 Previous Treatments: Failed Baclofen.  Therapy/Range of motion Indication for guidance: Target active muscules  Procedure: Botulinum toxin was mixed with preservative free saline with a dilution of 1cc to 100 units. Targeted limb and muscles were identified. The skin was prepped with alcohol swabs and placement of needle tip in targeted muscle was confirmed using appropriate guidance. Prior to injection, positioning of needle tip outside of blood vessel was determined by pulling back on syringe plunger.  MUSCLE UNITS: Right Biceps 30U Right FDS 50U Right FCR 20U Right FDP 20U  Total units used: 120U 80U discarded due to unavoidable waste  Complications: None Plan: Follow up in 6 weeks for  Botox eval  [name] 09/16/16 11:14 AM

## 2016-09-18 ENCOUNTER — Ambulatory Visit: Payer: Medicare Other | Admitting: Physical Therapy

## 2016-09-18 ENCOUNTER — Ambulatory Visit: Payer: Medicare Other | Admitting: Occupational Therapy

## 2016-09-18 ENCOUNTER — Encounter: Payer: Self-pay | Admitting: Physical Therapy

## 2016-09-18 DIAGNOSIS — I69351 Hemiplegia and hemiparesis following cerebral infarction affecting right dominant side: Secondary | ICD-10-CM | POA: Diagnosis not present

## 2016-09-18 DIAGNOSIS — R278 Other lack of coordination: Secondary | ICD-10-CM | POA: Diagnosis not present

## 2016-09-18 DIAGNOSIS — R29818 Other symptoms and signs involving the nervous system: Secondary | ICD-10-CM | POA: Diagnosis not present

## 2016-09-18 DIAGNOSIS — M6281 Muscle weakness (generalized): Secondary | ICD-10-CM

## 2016-09-18 DIAGNOSIS — R2681 Unsteadiness on feet: Secondary | ICD-10-CM

## 2016-09-18 DIAGNOSIS — R2689 Other abnormalities of gait and mobility: Secondary | ICD-10-CM

## 2016-09-18 NOTE — Therapy (Signed)
[facility] [address] [place], [place], [address] Phone: [phone]   Fax:  [phone]  Occupational Therapy Treatment  Patient Details  Name: Kelly [name] MRN: [id number] Date of Birth: Jan 30, 1928 Referring Provider: Cecille Rubin, NP  Encounter Date: 09/18/2016      OT End of Session - 09/18/16 1351    Visit Number 27   Number of Visits 30   Date for OT Re-Evaluation 09/28/16   Authorization Type Medicare / [employer] (g-code needed)   Authorization Time Period renewal completed 07/30/16   Authorization - Visit Number 91   Authorization - Number of Visits 30   OT Start Time 6226   OT Stop Time 1356   OT Time Calculation (min) 39 min   Activity Tolerance Patient tolerated treatment well   Behavior During Therapy [facility] for tasks assessed/performed      Past Medical History:  Diagnosis Date  . Acute blood loss anemia   . Adjustment disorder with depressed mood   . CVA (cerebral vascular accident) ([place]) 02/03/2016   Linear infarct within the posterior limb of left internal capsule  . Diastolic CHF ([place])   . Dysphagia   . Dysphonia 02/20/2013  . Essential and other specified forms of tremor 02/20/2013  . HLD (hyperlipidemia)   . HTN (hypertension)   . OAB (overactive bladder)   . Osteoporosis   . Patient receiving subcutaneous heparin    For DVT prophylaxis 9/17  . Prediabetes   . Psoriasis   . Slow transit constipation   . Stroke (cerebrum) ([place]) 02/01/2016  . Trigeminal neuralgia 02/20/2013    Past Surgical History:  Procedure Laterality Date  . APPENDECTOMY  1940  . bladder tack    . CATARACT EXTRACTION Bilateral   . HEMORRHOID SURGERY  1960  . HUMERUS FRACTURE SURGERY    . LAPAROSCOPIC HYSTERECTOMY  2006  . torn rotator cuff    . WRIST FRACTURE SURGERY  2005   seconday shoulder 2001    There were no vitals filed for this visit.      Subjective Assessment - 09/18/16 1320    Pertinent  History adjustment disorder with depressed mood, diastolic CHF, osteoporosis, hx of fall, essential tremor, HTN, prediabetes, trigeminal neuralgia   Limitations fall risk   Patient Stated Goals be able to write, fold clothes, do dishes   Currently in Pain? No/denies         Treatment: supination along tabletop with RUE with supination roller, min facilitation Standing to place checks in connect 4 frame, for fine motor coordination, standing balance and functional reach with RUE, pt played 2 games in standing with rest break in between, min-mod difficulty picking up checkers, min v.c. Arm bike x 6 mins level 1 for conditioning                       OT Short Term Goals - 08/25/16 1544      OT SHORT TERM GOAL #1   Title -----------------     OT SHORT TERM GOAL #2   Title ----------------     OT SHORT TERM GOAL #3   Title --------------     OT SHORT TERM GOAL #4   Title -----------------------     OT SHORT TERM GOAL #5   Title Pt will be able to cut food mod I.--check updated STGs 08/29/16   Period Weeks   Status On-going  08/24/16:  improved and is performing inconsistently  OT SHORT TERM GOAL #6   Title Pt will be able to write 2-3 sentences with 100% legibility.   Time 4   Period Weeks   Status On-going  08/25/16  approx 75% legibility today           OT Long Term Goals - 09/14/16 1609      OT LONG TERM GOAL #1   Title Pt will be independent with updated HEP--check updated LTGs 09/28/16   Time 8   Period Weeks   Status On-going  07/28/16:  met with current HEP, but would benefit from updates     OT LONG TERM GOAL #2   Title ---------------------------     OT LONG TERM GOAL #3   Title Pt will demo at least 110* R shoulder flex with min compensation in prep for functional reach.   Period Weeks   Status Achieved  07/28/16:  100* met initial goal.  met 08/25/16  at approx this level     OT LONG TERM GOAL #4   Title Pt will improve functional  reaching and grasp/release as shown by improving score on box and blocks to at least 25.  Revised 07/09/16:  as pt met initial goal of improving by 10   Baseline 3 blocks   Time 8   Period Weeks   Status On-going  07/09/16  Met, and revised goal;  07/30/16  17 blocks     OT LONG TERM GOAL #5   Title -------------------------------     OT LONG TERM GOAL #6   Title Pt will perform simple home maintenance tasks mod I.   Time 8   Period Weeks   Status Achieved  07/28/16  met folded clothes, washed dishes (supervision) and revised goal.  09/14/16  washing dishes, folding clothes     OT LONG TERM GOAL #7   Title Pt will demo improved coordination for ADLs as shown by completing 9-hole peg test in or less.   Time 8   Period Weeks   Status On-going  08/25/16:  placed 4 pegs in within , completed in 37.79sec               Plan - 09/18/16 1350    Clinical Impression Statement Pt is progressing towards goals. She recieved botox to RUE this week to adderrs spasticity   Rehab Potential Good   OT Frequency 2x / week   OT Duration --  9 weeks   OT Treatment/Interventions Self-care/ADL training;Therapeutic exercise;DME and/or AE instruction;Building services engineer;Therapeutic activities;Patient/family education;Balance training;Neuromuscular education;Electrical Stimulation;Moist Heat;Energy conservation;Passive range of motion;Therapeutic exercises;Splinting;Manual Therapy;Fluidtherapy;Ultrasound;Cryotherapy;Parrafin   Plan LUE functional use, reaching and coordination   Consulted and Agree with Plan of Care Patient      Patient will benefit from skilled therapeutic intervention in order to improve the following deficits and impairments:  Decreased coordination, Decreased activity tolerance, Decreased knowledge of use of DME, Decreased strength, Impaired UE functional use, Impaired tone, Decreased range of motion, Decreased balance, Decreased mobility, Difficulty  walking  Visit Diagnosis: Hemiplegia and hemiparesis following cerebral infarction affecting right dominant side (HCC)  Other abnormalities of gait and mobility  Unsteadiness on feet  Muscle weakness (generalized)  Other lack of coordination    Problem List Patient Active Problem List   Diagnosis Date Noted  . Slow transit constipation   . Acute blood loss anemia   . Adjustment disorder with depressed mood   . Cerebrovascular accident (CVA) ([facility]) 02/03/2016  . Dysarthria, post-stroke   . Dysphagia,  post-stroke   . Leukocytosis   . Prediabetes   . Right hemiparesis ([place])   . Cerebral infarction due to unspecified mechanism   . Other secondary hypertension   . Overactive bladder   . Diastolic dysfunction   . Hyperlipidemia   . Essential hypertension   . Essential tremor   . Stroke (cerebrum) ([place]) 02/01/2016  . Facial droop due to stroke 02/01/2016  . Essential and other specified forms of tremor 02/20/2013  . Dysphonia 02/20/2013  . Trigeminal neuralgia 02/20/2013    RINE,KATHRYN 09/18/2016, 1:53 PM  [facility] [address] [place], [place], [address] Phone: [phone]   Fax:  [phone]  Name: Kelly [name] MRN: [id number] Date of Birth: 05-May-1928

## 2016-09-18 NOTE — Therapy (Signed)
[facility] [address] [place], [place], [address] Phone: [phone]   Fax:  [phone]  Physical Therapy Treatment  Patient Details  Name: Kelly [name] MRN: [id number] Date of Birth: Nov 14, 1927 Referring Provider: Cecille Rubin  Encounter Date: 09/18/2016      PT End of Session - 09/18/16 1520    Visit Number 26   Number of Visits 36   Date for PT Re-Evaluation 09/25/16   Authorization Type Medicare/[place]-[place] every 10th visit   Authorization Time Period 07/30/16- 09/25/16   PT Start Time 1359   PT Stop Time 1447   PT Time Calculation (min) 48 min   Equipment Utilized During Treatment Gait belt   Activity Tolerance Patient tolerated treatment well   Behavior During Therapy [facility] for tasks assessed/performed      Past Medical History:  Diagnosis Date  . Acute blood loss anemia   . Adjustment disorder with depressed mood   . CVA (cerebral vascular accident) ([place]) 02/03/2016   Linear infarct within the posterior limb of left internal capsule  . Diastolic CHF ([place])   . Dysphagia   . Dysphonia 02/20/2013  . Essential and other specified forms of tremor 02/20/2013  . HLD (hyperlipidemia)   . HTN (hypertension)   . OAB (overactive bladder)   . Osteoporosis   . Patient receiving subcutaneous heparin    For DVT prophylaxis 9/17  . Prediabetes   . Psoriasis   . Slow transit constipation   . Stroke (cerebrum) ([place]) 02/01/2016  . Trigeminal neuralgia 02/20/2013    Past Surgical History:  Procedure Laterality Date  . APPENDECTOMY  1940  . bladder tack    . CATARACT EXTRACTION Bilateral   . HEMORRHOID SURGERY  1960  . HUMERUS FRACTURE SURGERY    . LAPAROSCOPIC HYSTERECTOMY  2006  . torn rotator cuff    . WRIST FRACTURE SURGERY  2005   seconday shoulder 2001    There were no vitals filed for this visit.      Subjective Assessment - 09/18/16 1400    Subjective Patient presents to PT in shoes that  do not have her toe cap. Patient denies any changes or falls since last visit. Patient reports she was able to walk on a paved surface at her daughter's house (flat driveway), and she was very pleased with her progress. Patient and husband report that they would like to return to walking at the park (which [place] a paved surface).    Currently in Pain? No/denies                         [facility] Adult PT Treatment/Exercise - 09/18/16 1400      Transfers   Transfers Sit to Stand;Stand to Sit   Sit to Stand 5: Supervision;With upper extremity assist;From bed   Sit to Stand Details Verbal cues for safe use of DME/AE   Stand to Sit 5: Supervision;With upper extremity assist;To bed   Stand to Sit Details (indicate cue type and reason) Verbal cues for safe use of DME/AE   Stand to Sit Details when fatigued at end of session, required cueing to get both legs against the mat prior to sititng     Ambulation/Gait   Ambulation/Gait Yes   Ambulation/Gait Assistance 4: Min guard  due to poor R foot clearance and no toe cap on R shoe   Ambulation/Gait Assistance Details Patient requires multiple cues for R foot clearance and to "focus  on contorlling her R knee". Provided tactile cueing at her R knee to prevent recruvatum, and verbally cued patient to focus on controlling the knee. Cued for proper weight shift and posture during ambulation.   Ambulation Distance (Feet) 320 Feet  320 (outdoors/paved/ramp/curb); 60 x 2 (level/indoor)    Assistive device Rolling walker;Parallel bars   Gait Pattern Step-through pattern;Decreased stride length;Decreased dorsiflexion - right;Right genu recurvatum;Poor foot clearance - right   Ambulation Surface Level;Indoor;Outdoor;Paved;Unlevel  paved ramps/curbs   Ramp Other (comment)  min guard    Ramp Details (indicate cue type and reason) requires cueing for R foot clearance when ascending a ramp, and cueing to slow RW when descending   Curb 4: Min assist    Curb Details (indicate cue type and reason) requires cueing for sequencing of RW/foot placement and technique/spacing of RW to curb. Patient rquires min A for safety.   Gait Comments In parallel bars: performed forward weight shifting onto RLE with forward step with manual facilitation of R knee to prevent recruvatum. Patient requires verbal cueing for weight shift onto RLE and posture. Patient ambulated with RW and PT providing tactile facilitation on RLE to prevent recruvatum. PT cued patient to focus on engaging/controlling R knee during gait.     High Level Balance   High Level Balance Activities Other (comment)  Weight shifting with lateral reach    High Level Balance Comments At countertop with PT directly behind, patient performed side step with lateral reach towards a cone to foster weight shifting with knee control onto RLE. Patient requires tactile cueing for weight shift onto RLE, cueing to "focus on controlling the knee", and posture.     Exercises   Other Exercises  --     Lumbar Exercises: Machines for Strengthening   Leg Press --     Ankle Exercises: Seated   Toe Raise --   Other Seated Ankle Exercises --                PT Education - 09/18/16 1437    Education provided Yes   Education Details importance of toe cap on shoes to aid in clearance of RLE and to derease fall risk    Person(s) Educated Patient;Spouse   Methods Explanation   Comprehension Verbalized understanding          PT Short Term Goals - 08/14/16 1912      PT SHORT TERM GOAL #1   Title Pt will perform HEP with family's supervision, to address balance, gait, transfers and strengthening.  TARGET 07/01/16 (NEW TARGET DATE FOR ALL CURRENT STGs 08/30/16)   Baseline 06/29/16: met per pt and spouse report   Status Achieved     PT SHORT TERM GOAL #2   Title Pt will improve Berg Balance score to at least 26/56 for decreased fall risk.   Baseline 06/29/16: 30/56 scored today   Status Achieved     PT  SHORT TERM GOAL #3   Title Pt will improve TUG score to less than or equal to 50 seconds for decreased fall risk.   Baseline 06/29/16: 23.81 with RW   Status Achieved     PT SHORT TERM GOAL #4   Title Pt will transfer sit<>stand, modified independently, at least 4 of 5 trials, to demo improved lower extremity strength and transfer efficiency.   Baseline 06/29/16: pt at supervision level with cues for safety/sequencing needed at times   Status Partially Met     PT SHORT TERM GOAL #5   Title  Pt will ambulate at least 200 ft using RW, supervision, for improved gait safety and efficiency.   Baseline 06/29/16: met today with RW   Status Achieved     PT SHORT TERM GOAL #6   Title Pt/family will verbalize understanding of CVA education.   Time 4   Period Weeks   Status On-going           PT Long Term Goals - 08/14/16 1913      PT LONG TERM GOAL #1   Title Pt/family will verbalize understanding of fall prevention in the home environment.  TARGET 07/31/16 (NEW TARGET DATE FOR ALL CURRENT LTGS 09/25/16)   Time 8   Period Weeks   Status On-going     PT LONG TERM GOAL #2   Title Pt will improve Berg Balance score to at least 31/56 for decreased fall risk.   Time 8   Period Weeks   Status On-going     PT LONG TERM GOAL #3   Title Pt will improve TUG score to less than or equal to 45 seconds for decreased fall risk.   Baseline 33.41 sec 07/28/16   Time 8   Period Weeks   Status Achieved     PT LONG TERM GOAL #4   Title Pt will improve gait velocity to at least 1.8 ft/sec for improved gait efficiency and safety.   Baseline 1.56 ft/sec with RW 07/28/16   Time 8   Period Weeks   Status Achieved     PT LONG TERM GOAL #5   Title Pt will ambulate at least 800 ft indoor and outdoor surfaces, using RW, modified independently, for improved outdoor and community.   Time 8   Period Weeks   Status On-going     PT LONG TERM GOAL #6   Title Pt will improve Neuro QOL score on FOTO by at least 20%  for improved functional mobility.   Time 8   Period Weeks   Status On-going               Plan - 09/18/16 1521    Clinical Impression Statement Today's skilled PT session focused on R knee control during dynamic gait and balance activities. PT provided manual facilitation at R knee during ambulation to minimize recuvatum while cueing patient to focus on controlling her R knee. Patient requires demonstration and cueing for proper weight shift, posture and foot clearance in gait. R foot clearance was more problematic today because the patient presented to PT wearing shoes that did not have a toe cap in place. Patient and patient's husband educated on importance of wearing shoes with the toe cap to aid in R foot clearance and decrease her risk of falling. With repetition (in both gait and balance activities) patient demonstrated improvement in R knee control throughout today's session. Patient will benefit from skilled PT to continue to work on remaining balance and gait deficits.   Rehab Potential Good   PT Frequency 2x / week   PT Duration 8 weeks   PT Treatment/Interventions ADLs/Self Care Home Management;Functional mobility training;Stair training;Gait training;Patient/family education;Neuromuscular re-education;Balance training;Therapeutic exercise;Therapeutic activities;DME Instruction   PT Next Visit Plan greater distance in ambulation (possibly outdoors as patient and spouse want to return to outdoor walking), ramps/curbs, standing balance with R knee control    Consulted and Agree with Plan of Care Patient;Family member/caregiver   Family Member Consulted Husband      Patient will benefit from skilled therapeutic intervention in order to improve  the following deficits and impairments:  Abnormal gait, Decreased balance, Decreased cognition, Decreased knowledge of use of DME, Decreased range of motion, Decreased safety awareness, Decreased strength  Visit Diagnosis: Other  abnormalities of gait and mobility  Unsteadiness on feet  Muscle weakness (generalized)  Other lack of coordination  Hemiplegia and hemiparesis following cerebral infarction affecting right dominant side [facility])     Problem List Patient Active Problem List   Diagnosis Date Noted  . Slow transit constipation   . Acute blood loss anemia   . Adjustment disorder with depressed mood   . Cerebrovascular accident (CVA) ([place]) 02/03/2016  . Dysarthria, post-stroke   . Dysphagia, post-stroke   . Leukocytosis   . Prediabetes   . Right hemiparesis ([place])   . Cerebral infarction due to unspecified mechanism   . Other secondary hypertension   . Overactive bladder   . Diastolic dysfunction   . Hyperlipidemia   . Essential hypertension   . Essential tremor   . Stroke (cerebrum) ([place]) 02/01/2016  . Facial droop due to stroke 02/01/2016  . Essential and other specified forms of tremor 02/20/2013  . Dysphonia 02/20/2013  . Trigeminal neuralgia 02/20/2013    Arelia Sneddon, SPT  09/18/2016, 3:28 PM  [place] [address] [place], [place], [address] Phone: [phone]   Fax:  [phone]  Name: Kelly [name] MRN: [id number] Date of Birth: [birth year]

## 2016-09-22 ENCOUNTER — Ambulatory Visit: Payer: Medicare Other | Attending: Nurse Practitioner | Admitting: Physical Therapy

## 2016-09-22 ENCOUNTER — Encounter: Payer: Self-pay | Admitting: Physical Therapy

## 2016-09-22 ENCOUNTER — Ambulatory Visit: Payer: Medicare Other | Admitting: Occupational Therapy

## 2016-09-22 DIAGNOSIS — I69351 Hemiplegia and hemiparesis following cerebral infarction affecting right dominant side: Secondary | ICD-10-CM

## 2016-09-22 DIAGNOSIS — R2689 Other abnormalities of gait and mobility: Secondary | ICD-10-CM

## 2016-09-22 DIAGNOSIS — R278 Other lack of coordination: Secondary | ICD-10-CM | POA: Diagnosis not present

## 2016-09-22 DIAGNOSIS — M6281 Muscle weakness (generalized): Secondary | ICD-10-CM

## 2016-09-22 DIAGNOSIS — R29818 Other symptoms and signs involving the nervous system: Secondary | ICD-10-CM | POA: Insufficient documentation

## 2016-09-22 DIAGNOSIS — R2681 Unsteadiness on feet: Secondary | ICD-10-CM | POA: Insufficient documentation

## 2016-09-22 NOTE — Therapy (Signed)
[facility] [address] [place], [place], [address] Phone: [phone]   Fax:  [phone]  Physical Therapy Treatment  Patient Details  Name: Kelly [name] MRN: [id number] Date of Birth: 13-Feb-1928 Referring Provider: Cecille Rubin  Encounter Date: 09/22/2016      PT End of Session - 09/22/16 1728    Visit Number 27   Number of Visits 36   Date for PT Re-Evaluation 09/25/16   Authorization Type Medicare/[employer]-G[employer] every 10th visit   Authorization Time Period 07/30/16- 09/25/16   PT Start Time 1017   PT Stop Time 1100   PT Time Calculation (min) 43 min   Activity Tolerance Patient tolerated treatment well   Behavior During Therapy [facility] for tasks assessed/performed      Past Medical History:  Diagnosis Date  . Acute blood loss anemia   . Adjustment disorder with depressed mood   . CVA (cerebral vascular accident) ([place]) 02/03/2016   Linear infarct within the posterior limb of left internal capsule  . Diastolic CHF ([place])   . Dysphagia   . Dysphonia 02/20/2013  . Essential and other specified forms of tremor 02/20/2013  . HLD (hyperlipidemia)   . HTN (hypertension)   . OAB (overactive bladder)   . Osteoporosis   . Patient receiving subcutaneous heparin    For DVT prophylaxis 9/17  . Prediabetes   . Psoriasis   . Slow transit constipation   . Stroke (cerebrum) ([place]) 02/01/2016  . Trigeminal neuralgia 02/20/2013    Past Surgical History:  Procedure Laterality Date  . APPENDECTOMY  1940  . bladder tack    . CATARACT EXTRACTION Bilateral   . HEMORRHOID SURGERY  1960  . HUMERUS FRACTURE SURGERY    . LAPAROSCOPIC HYSTERECTOMY  2006  . torn rotator cuff    . WRIST FRACTURE SURGERY  2005   seconday shoulder 2001    There were no vitals filed for this visit.      Subjective Assessment - 09/22/16 1022    Subjective (P)  Has been walking in [facility] twice since last at PT. Plans to have a second  pair of shoes fitted with leather toe cap.    Currently in Pain? (P)  No/denies                         [facility] Adult PT Treatment/Exercise - 09/22/16 0001      Transfers   Transfers Sit to Stand;Stand to Sit   Sit to Stand 4: Min assist;With upper extremity assist;Multiple attempts  low surface with soft grey cushion   Sit to Stand Details (indicate cue type and reason) assist for 8 of 10 reps for anterior wt shift and initiate rise of hips off surface   Stand to Sit 4: Min guard   Stand to Sit Details for safety due to low surface and no armrests   Number of Reps 10 reps     Ambulation/Gait   Ambulation/Gait Yes   Ambulation/Gait Assistance 4: Min guard   Ambulation/Gait Assistance Details Patient required infrequent cues to move closer into her RW; after ~500 ft, pt took a standing rest break; last 100 ft noted incr Rt knee hyperextension and Rt toe scuffed on sidewalk x 2   Ambulation Distance (Feet) 600 Feet   Assistive device Rolling walker   Gait Pattern Step-through pattern;Decreased stride length;Decreased dorsiflexion - right;Right genu recurvatum;Poor foot clearance - right   Ambulation Surface Level;Unlevel;Indoor;Outdoor;Other (comment)  cement sidewalk with unlevel places   Pre-Gait Activities with RW; staggered stance weight shifting ant-post with focus on Rt knee control. Pt with much difficulty extending in her hip without knee hyperextension; with PT blocking the motion, she eventually was controlling the knee, but with poor upright posture over RLE                  PT Short Term Goals - 08/14/16 1912      PT SHORT TERM GOAL #1   Title Pt will perform HEP with family's supervision, to address balance, gait, transfers and strengthening.  TARGET 07/01/16 (NEW TARGET DATE FOR ALL CURRENT STGs 08/30/16)   Baseline 06/29/16: met per pt and spouse report   Status Achieved     PT SHORT TERM GOAL #2   Title Pt will improve Berg Balance score to at  least 26/56 for decreased fall risk.   Baseline 06/29/16: 30/56 scored today   Status Achieved     PT SHORT TERM GOAL #3   Title Pt will improve TUG score to less than or equal to 50 seconds for decreased fall risk.   Baseline 06/29/16: 23.81 with RW   Status Achieved     PT SHORT TERM GOAL #4   Title Pt will transfer sit<>stand, modified independently, at least 4 of 5 trials, to demo improved lower extremity strength and transfer efficiency.   Baseline 06/29/16: pt at supervision level with cues for safety/sequencing needed at times   Status Partially Met     PT SHORT TERM GOAL #5   Title Pt will ambulate at least 200 ft using RW, supervision, for improved gait safety and efficiency.   Baseline 06/29/16: met today with RW   Status Achieved     PT SHORT TERM GOAL #6   Title Pt/family will verbalize understanding of CVA education.   Time 4   Period Weeks   Status On-going           PT Long Term Goals - 08/14/16 1913      PT LONG TERM GOAL #1   Title Pt/family will verbalize understanding of fall prevention in the home environment.  TARGET 07/31/16 (NEW TARGET DATE FOR ALL CURRENT LTGS 09/25/16)   Time 8   Period Weeks   Status On-going     PT LONG TERM GOAL #2   Title Pt will improve Berg Balance score to at least 31/56 for decreased fall risk.   Time 8   Period Weeks   Status On-going     PT LONG TERM GOAL #3   Title Pt will improve TUG score to less than or equal to 45 seconds for decreased fall risk.   Baseline 33.41 sec 07/28/16   Time 8   Period Weeks   Status Achieved     PT LONG TERM GOAL #4   Title Pt will improve gait velocity to at least 1.8 ft/sec for improved gait efficiency and safety.   Baseline 1.56 ft/sec with RW 07/28/16   Time 8   Period Weeks   Status Achieved     PT LONG TERM GOAL #5   Title Pt will ambulate at least 800 ft indoor and outdoor surfaces, using RW, modified independently, for improved outdoor and community.   Time 8   Period Weeks    Status On-going     PT LONG TERM GOAL #6   Title Pt will improve Neuro QOL score on FOTO by at least 20% for improved functional mobility.  Time 8   Period Weeks   Status On-going             Plan - 09/22/16 1732    Clinical Impression Statement Session continued to focus on gait training with Rt knee control on cement sidewalks (uneven) x 600 ft. Transfer from low, soft surface for strengthening and technique and pre-gait activities for Rt knee control also completed. Patient will have LTGs assessed next visit and plan to re-certify patient for additional PT as she has just recently begun to make improvements again after her setback from her fall (and resulting fear).    Rehab Potential Good   PT Frequency 2x / week   PT Duration 8 weeks   PT Treatment/Interventions ADLs/Self Care Home Management;Functional mobility training;Stair training;Gait training;Patient/family education;Neuromuscular re-education;Balance training;Therapeutic exercise;Therapeutic activities;DME Instruction   PT Next Visit Plan check LTGs; discuss recert for 4 weeks? 8 weeks? ramps/curbs, standing balance with R knee control    Consulted and Agree with Plan of Care Patient;Family member/caregiver   Family Member Consulted Husband             Plan - 09/22/16 1732    Clinical Impression Statement Session continued to focus on gait training with Rt knee control on cement sidewalks (uneven) x 600 ft. Transfer from low, soft surface for strengthening and technique and pre-gait activities for Rt knee control also completed. Patient will have LTGs assessed next visit and plan to re-certify patient for additional PT as she has just recently begun to make improvements again after her setback from her fall (and resulting fear).    Rehab Potential Good   PT Frequency 2x / week   PT Duration 8 weeks   PT Treatment/Interventions ADLs/Self Care Home Management;Functional mobility training;Stair training;Gait  training;Patient/family education;Neuromuscular re-education;Balance training;Therapeutic exercise;Therapeutic activities;DME Instruction   PT Next Visit Plan check LTGs; discuss recert for 4 weeks? 8 weeks? ramps/curbs, standing balance with R knee control    Consulted and Agree with Plan of Care Patient;Family member/caregiver   Family Member Consulted Husband      Patient will benefit from skilled therapeutic intervention in order to improve the following deficits and impairments:  Abnormal gait, Decreased balance, Decreased cognition, Decreased knowledge of use of DME, Decreased range of motion, Decreased safety awareness, Decreased strength  Visit Diagnosis: Other abnormalities of gait and mobility  Unsteadiness on feet  Muscle weakness (generalized)     Problem List Patient Active Problem List   Diagnosis Date Noted  . Slow transit constipation   . Acute blood loss anemia   . Adjustment disorder with depressed mood   . Cerebrovascular accident (CVA) ([place]) 02/03/2016  . Dysarthria, post-stroke   . Dysphagia, post-stroke   . Leukocytosis   . Prediabetes   . Right hemiparesis ([place])   . Cerebral infarction due to unspecified mechanism   . Other secondary hypertension   . Overactive bladder   . Diastolic dysfunction   . Hyperlipidemia   . Essential hypertension   . Essential tremor   . Stroke (cerebrum) ([place]) 02/01/2016  . Facial droop due to stroke 02/01/2016  . Essential and other specified forms of tremor 02/20/2013  . Dysphonia 02/20/2013  . Trigeminal neuralgia 02/20/2013    Rexanne Mano, PT 09/22/2016, 5:38 PM  [facility] [address] [place], [place], [address] Phone: [phone]   Fax:  [phone]  Name: Kelly [name] MRN: [id number] Date of Birth: [birth year]

## 2016-09-22 NOTE — Patient Instructions (Signed)
  ROM: Extension - Wand (Standing)   Stand holding wand behind back. Raise arms as far as possible. While keeping chest/head up.  Palms facing forward. Repeat 10 times per set.  Do 1 sessions per day.     2.  Toss small ball to your husband with your right hand and try to catch with the left hand.  3.  Lay on your back.  Hold 2lb with both hands (on the sides of the weight).  Hold at chest and press and straight up to the ceiling. 10x  4.  Lay on your back.  Hold 2lb with both hands (on the sides of the weight).  Keep elbows straight and slowly raise to shoulder height.  10x

## 2016-09-22 NOTE — Therapy (Signed)
[facility] [address] [place], [place], [address] Phone: [phone]   Fax:  [phone]  Occupational Therapy Treatment  Patient Details  Name: Kelly [name] MRN: [id number] Date of Birth: 15-Oct-1927 Referring Provider: Cecille Rubin, NP  Encounter Date: 09/22/2016      OT End of Session - 09/22/16 1109    Visit Number 28   Number of Visits 30   Date for OT Re-Evaluation 09/28/16   Authorization Type Medicare / [employer] (g-code needed)   Authorization Time Period renewal completed 07/30/16   Authorization - Visit Number 75   Authorization - Number of Visits 30   OT Start Time 1106   OT Stop Time 1150   OT Time Calculation (min) 44 min   Activity Tolerance Patient tolerated treatment well   Behavior During Therapy [facility] for tasks assessed/performed      Past Medical History:  Diagnosis Date  . Acute blood loss anemia   . Adjustment disorder with depressed mood   . CVA (cerebral vascular accident) ([place]) 02/03/2016   Linear infarct within the posterior limb of left internal capsule  . Diastolic CHF ([place])   . Dysphagia   . Dysphonia 02/20/2013  . Essential and other specified forms of tremor 02/20/2013  . HLD (hyperlipidemia)   . HTN (hypertension)   . OAB (overactive bladder)   . Osteoporosis   . Patient receiving subcutaneous heparin    For DVT prophylaxis 9/17  . Prediabetes   . Psoriasis   . Slow transit constipation   . Stroke (cerebrum) ([place]) 02/01/2016  . Trigeminal neuralgia 02/20/2013    Past Surgical History:  Procedure Laterality Date  . APPENDECTOMY  1940  . bladder tack    . CATARACT EXTRACTION Bilateral   . HEMORRHOID SURGERY  1960  . HUMERUS FRACTURE SURGERY    . LAPAROSCOPIC HYSTERECTOMY  2006  . torn rotator cuff    . WRIST FRACTURE SURGERY  2005   seconday shoulder 2001    There were no vitals filed for this visit.      Subjective Assessment - 09/22/16 1105    Subjective   Pt reports that she received Botox last Thurs.   Pertinent History adjustment disorder with depressed mood, diastolic CHF, osteoporosis, hx of fall, essential tremor, HTN, prediabetes, trigeminal neuralgia   Limitations fall risk   Patient Stated Goals be able to write, fold clothes, do dishes   Currently in Pain? No/denies       In supine, scapular retraction with chest lift using UEs to push on mat.  In sitting, functional reaching at low level to remove/replace cylinder pegs with multiple breaks and mod difficulty.  In sitting, mid-level functional reaching to grasp/release medium cylinder objects with min difficulty with tremors/control (min cueing for finger/elbow ext).  Incr difficulty with fatigue and incr spasticity with fatigue.  Attempting to roll small ball and toss to facilitate spontaneous opening with R hand.  Pt with max difficulty, but able to roll/toss with repetition.                   OT Education - 09/22/16 1211    Education Details Updates to HEP--see pt instructions   Person(s) Educated Patient;Spouse   Methods Explanation;Demonstration;Handout;Verbal cues   Comprehension Verbalized understanding;Returned demonstration;Verbal cues required          OT Short Term Goals - 08/25/16 1544      OT SHORT TERM GOAL #1   Title -----------------  OT SHORT TERM GOAL #2   Title ----------------     OT SHORT TERM GOAL #3   Title --------------     OT SHORT TERM GOAL #4   Title -----------------------     OT SHORT TERM GOAL #5   Title Pt will be able to cut food mod I.--check updated STGs 08/29/16   Period Weeks   Status On-going  08/24/16:  improved and is performing inconsistently     OT SHORT TERM GOAL #6   Title Pt will be able to write 2-3 sentences with 100% legibility.   Time 4   Period Weeks   Status On-going  08/25/16  approx 75% legibility today           OT Long Term Goals - 09/14/16 1609      OT LONG TERM GOAL #1   Title Pt  will be independent with updated HEP--check updated LTGs 09/28/16   Time 8   Period Weeks   Status On-going  07/28/16:  met with current HEP, but would benefit from updates     OT LONG TERM GOAL #2   Title ---------------------------     OT LONG TERM GOAL #3   Title Pt will demo at least 110* R shoulder flex with min compensation in prep for functional reach.   Period Weeks   Status Achieved  07/28/16:  100* met initial goal.  met 08/25/16  at approx this level     OT LONG TERM GOAL #4   Title Pt will improve functional reaching and grasp/release as shown by improving score on box and blocks to at least 25.  Revised 07/09/16:  as pt met initial goal of improving by 10   Baseline 3 blocks   Time 8   Period Weeks   Status On-going  07/09/16  Met, and revised goal;  07/30/16  17 blocks     OT LONG TERM GOAL #5   Title -------------------------------     OT LONG TERM GOAL #6   Title Pt will perform simple home maintenance tasks mod I.   Time 8   Period Weeks   Status Achieved  07/28/16  met folded clothes, washed dishes (supervision) and revised goal.  09/14/16  washing dishes, folding clothes     OT LONG TERM GOAL #7   Title Pt will demo improved coordination for ADLs as shown by completing 9-hole peg test in 61mn or less.   Time 8   Period Weeks   Status On-going  08/25/16:  placed 4 pegs in within 236m, completed in 31m68m37.79sec               Plan - 09/22/16 1201    Clinical Impression Statement Pt is progressing towards goals.  She continues to progress with coordination, but fatigues quicking with decr proximal strength.   Rehab Potential Good   OT Frequency 2x / week   OT Duration --  9 weeks   OT Treatment/Interventions Self-care/ADL training;Therapeutic exercise;DME and/or AE instruction;FunTherapist, nutritionalerapeutic activities;Patient/family education;Balance training;Neuromuscular education;Electrical Stimulation;Moist Heat;Energy conservation;Passive range of  motion;Therapeutic exercises;Splinting;Manual Therapy;Fluidtherapy;Ultrasound;Cryotherapy;Parrafin   Plan begin checking goals in prep for renewal next week; review updated HEP issued today   OT Home Exercise Plan Education provided:  06/05/15 Initial HEP (table slides with light wt. bearing, tracing, and flipping cards); 06/22/16 supine ball ex and thumb opposition; 09/22/16  updates to HEP   Consulted and Agree with Plan of Care Patient      Patient will benefit from skilled  therapeutic intervention in order to improve the following deficits and impairments:  Decreased coordination, Decreased activity tolerance, Decreased knowledge of use of DME, Decreased strength, Impaired UE functional use, Impaired tone, Decreased range of motion, Decreased balance, Decreased mobility, Difficulty walking  Visit Diagnosis: Hemiplegia and hemiparesis following cerebral infarction affecting right dominant side (HCC)  Other abnormalities of gait and mobility  Unsteadiness on feet  Muscle weakness (generalized)  Other lack of coordination  Other symptoms and signs involving the nervous system    Problem List Patient Active Problem List   Diagnosis Date Noted  . Slow transit constipation   . Acute blood loss anemia   . Adjustment disorder with depressed mood   . Cerebrovascular accident (CVA) ([place]) 02/03/2016  . Dysarthria, post-stroke   . Dysphagia, post-stroke   . Leukocytosis   . Prediabetes   . Right hemiparesis ([place])   . Cerebral infarction due to unspecified mechanism   . Other secondary hypertension   . Overactive bladder   . Diastolic dysfunction   . Hyperlipidemia   . Essential hypertension   . Essential tremor   . Stroke (cerebrum) ([place]) 02/01/2016  . Facial droop due to stroke 02/01/2016  . Essential and other specified forms of tremor 02/20/2013  . Dysphonia 02/20/2013  . Trigeminal neuralgia 02/20/2013    [facility] 09/22/2016, 12:19 PM  [facility] [address] [place], [place], [address] Phone: [phone]   Fax:  [phone]  Name: Kelly [name] MRN: [id number] Date of Birth: [birth year]   Vianne Bulls, OTR/L [facility] [address] [place], [place]  [address] [phone] phone [phone] 09/22/16 12:19 PM

## 2016-09-24 ENCOUNTER — Encounter: Payer: Self-pay | Admitting: Physical Therapy

## 2016-09-24 ENCOUNTER — Ambulatory Visit: Payer: Medicare Other | Admitting: Physical Therapy

## 2016-09-24 ENCOUNTER — Ambulatory Visit: Payer: Medicare Other | Admitting: Occupational Therapy

## 2016-09-24 DIAGNOSIS — R278 Other lack of coordination: Secondary | ICD-10-CM | POA: Diagnosis not present

## 2016-09-24 DIAGNOSIS — R2689 Other abnormalities of gait and mobility: Secondary | ICD-10-CM

## 2016-09-24 DIAGNOSIS — R2681 Unsteadiness on feet: Secondary | ICD-10-CM

## 2016-09-24 DIAGNOSIS — R29818 Other symptoms and signs involving the nervous system: Secondary | ICD-10-CM

## 2016-09-24 DIAGNOSIS — M6281 Muscle weakness (generalized): Secondary | ICD-10-CM

## 2016-09-24 DIAGNOSIS — I69351 Hemiplegia and hemiparesis following cerebral infarction affecting right dominant side: Secondary | ICD-10-CM

## 2016-09-24 NOTE — Therapy (Signed)
[facility] [address] [place], [place], [address] Phone: [phone]   Fax:  [phone]  Physical Therapy Treatment  Patient Details  Name: Kelly [name] MRN: [id number] Date of Birth: [birth year] Referring Provider: Cecille Rubin  Encounter Date: 09/24/2016        PT End of Session - 09/25/16 1228    Visit Number 28  G1 5/3 recert)   Number of Visits 52  recert 5/3 for 8 wks   Date for PT Re-Evaluation 11/20/16   Authorization Type Medicare/[employer] every 10th visit   Authorization Time Period 07/30/16- 09/25/16;   09/24/16 to 11/20/16   PT Start Time 1445   PT Stop Time 1528   PT Time Calculation (min) 43 min   Activity Tolerance Patient tolerated treatment well   Behavior During Therapy [facility] for tasks assessed/performed      Past Medical History:  Diagnosis Date  . Acute blood loss anemia   . Adjustment disorder with depressed mood   . CVA (cerebral vascular accident) ([place]) 02/03/2016   Linear infarct within the posterior limb of left internal capsule  . Diastolic CHF ([place])   . Dysphagia   . Dysphonia 02/20/2013  . Essential and other specified forms of tremor 02/20/2013  . HLD (hyperlipidemia)   . HTN (hypertension)   . OAB (overactive bladder)   . Osteoporosis   . Patient receiving subcutaneous heparin    For DVT prophylaxis 9/17  . Prediabetes   . Psoriasis   . Slow transit constipation   . Stroke (cerebrum) ([place]) 02/01/2016  . Trigeminal neuralgia 02/20/2013    Past Surgical History:  Procedure Laterality Date  . APPENDECTOMY  1940  . bladder tack    . CATARACT EXTRACTION Bilateral   . HEMORRHOID SURGERY  1960  . HUMERUS FRACTURE SURGERY    . LAPAROSCOPIC HYSTERECTOMY  2006  . torn rotator cuff    . WRIST FRACTURE SURGERY  2005   seconday shoulder 2001    There were no vitals filed for this visit.      Subjective Assessment - 09/24/16 1451    Subjective Has been walking in  [facility] twice since last at PT. Plans to have a second pair of shoes fitted with leather toe cap.                          [facility] Adult PT Treatment/Exercise - 09/24/16 0001      Standardized Balance Assessment   Standardized Balance Assessment Berg Balance Test     Berg Balance Test   Sit to Stand Able to stand  independently using hands   Standing Unsupported Able to stand safely 2 minutes   Sitting with Back Unsupported but Feet Supported on Floor or Stool Able to sit safely and securely 2 minutes   Stand to Sit Sits safely with minimal use of hands   Transfers Able to transfer safely, definite need of hands   Standing Unsupported with Eyes Closed Able to stand 10 seconds with supervision   Standing Ubsupported with Feet Together Able to place feet together independently and stand for 1 minute with supervision   From Standing, Reach Forward with Outstretched Arm Reaches forward but needs supervision   From Standing Position, Pick up Object from Floor Able to pick up shoe, needs supervision   From Standing Position, Turn to Look Behind Over each Shoulder Turn sideways only but maintains balance   Turn 360 Degrees  Needs assistance while turning   Standing Unsupported, Alternately Place Feet on Step/Stool Able to complete >2 steps/needs minimal assist   Standing Unsupported, One Foot in Front Needs help to step but can hold 15 seconds   Standing on One Leg Unable to try or needs assist to prevent fall   Total Score 32             PT Short Term Goals - 09/25/16 1508      PT SHORT TERM GOAL #1   Title Pt will perform HEP with family's supervision, to address balance, gait, transfers and strengthening.  TARGET 07/01/16 (NEW TARGET DATE FOR ALL CURRENT STGs 08/30/16)   Baseline 06/29/16: met per pt and spouse report   Status Achieved     PT SHORT TERM GOAL #2   Title Pt will improve Berg Balance score to at least 26/56 for decreased fall risk.   Baseline 06/29/16:  30/56 scored today   Status Achieved     PT SHORT TERM GOAL #3   Title Pt will improve TUG score to less than or equal to 50 seconds for decreased fall risk.   Baseline 06/29/16: 23.81 with RW   Status Achieved     PT SHORT TERM GOAL #4   Title Pt will transfer sit<>stand, modified independently, at least 4 of 5 trials, to demo improved lower extremity strength and transfer efficiency.   Baseline 06/29/16: pt at supervision level with cues for safety/sequencing needed at times   Status Not Met     PT SHORT TERM GOAL #5   Title Pt will ambulate at least 200 ft using RW, supervision, for improved gait safety and efficiency.   Baseline 06/29/16: met today with RW   Status Achieved     Additional Short Term Goals   Additional Short Term Goals Yes     PT SHORT TERM GOAL #6   Title Pt/family will verbalize understanding of CVA education.   Time 4   Period Weeks   Status Achieved     PT SHORT TERM GOAL #7   Title ----------------------------------------------------------------------------------------------------     PT SHORT TERM GOAL #8   Title Patient will demonstrate sit to stand 8 of 10 times with correct sequencing for safety with supervision but no cues. (TARGET for STGs 10-23-16)   Time 4   Period Weeks   Status New     PT SHORT TERM GOAL #9   TITLE Patient will ascend/descend curb with RW with minguard assist (no physical assist, but close proximity) without verbal cues.    Time 4   Period Weeks   Status New             PT Long Term Goals - 09/24/16 1451      PT LONG TERM GOAL #1   Title Pt/family will verbalize understanding of fall prevention in the home environment.  TARGET 07/31/16 (NEW TARGET DATE FOR ALL CURRENT LTGS 09/25/16)  5/4 MET   Time 8   Period Weeks   Status Achieved     PT LONG TERM GOAL #2   Title Pt will improve Berg Balance score to at least 31/56 for decreased fall risk.  09/24/16   32/56   Time 8   Period Weeks   Status Achieved     PT LONG TERM  GOAL #3   Title Pt will improve TUG score to less than or equal to 45 seconds for decreased fall risk.     Baseline 33.41 sec 07/28/16  5/3 23.21 sec   Time 8   Period Weeks   Status Achieved     PT LONG TERM GOAL #4   Title Pt will improve gait velocity to at least 1.8 ft/sec for improved gait efficiency and safety.   Baseline 1.56 ft/sec with RW 07/28/16;  09/24/16  2.23 ft/sec   Time 8   Period Weeks   Status Achieved     PT LONG TERM GOAL #5   Title Pt will ambulate at least 800 ft indoor and outdoor surfaces, using RW, modified independently, for improved outdoor and community. (NEW TARGET date 11-20-16)   Baseline 5/2 did 600 ft   Time 8   Period Weeks   Status On-going     Additional Long Term Goals   Additional Long Term Goals Yes     PT LONG TERM GOAL #6   Title Pt will improve Neuro QOL score on FOTO by at least 20% for improved functional mobility. (NEW TARGET date 11-20-16)   Time 8   Period Weeks   Status On-going     PT LONG TERM GOAL #7   Title Patient will increase Berg score to 39/56 to indicate lesser fall risk. (TARGET date 11-20-16)   Time 8   Period Weeks   Status New     PT LONG TERM GOAL #8   Title Patient will increase her gait velocity to >=2.45 ft/sec indicative of increased safety with community ambulation. (TARGET date 11-20-16)   Time 8   Period Weeks   Status New              Plan - 09/25/16 1307    Clinical Impression Statement Session focused on assessment of LTGs with patient meeting 4 of 6, with remaining 2 goals continuing to be appropriate. Patient had a set-back during this course of therapy due to a fall and resulting fear of movement/walking. She has now turned this around and is making progress and can benefit from continued PT to work towards remaining and additional/new LTG's   Rehab Potential Good   PT Frequency 2x / week   PT Duration 8 weeks   PT Treatment/Interventions ADLs/Self Care Home Management;Functional mobility  training;Stair training;Gait training;Patient/family education;Neuromuscular re-education;Balance training;Therapeutic exercise;Therapeutic activities;DME Instruction   PT Next Visit Plan  standing balance with R knee control ; ramps/curbs,    Consulted and Agree with Plan of Care Patient;Family member/caregiver   Family Member Consulted Husband                   Patient will benefit from skilled therapeutic intervention in order to improve the following deficits and impairments:   Abnormal gait, decreased balance, decreased cognition, decreased knowledge of use of DME, Decreased range of motion, Decreased safety awareness, Decreased strength    Visit Diagnosis: Other abnormalities of gait and mobility  Unsteadiness on feet  Muscle weakness (generalized)    Problem List Patient Active Problem List   Diagnosis Date Noted  . Slow transit constipation   . Acute blood loss anemia   . Adjustment disorder with depressed mood   . Cerebrovascular accident (CVA) ([place]) 02/03/2016  . Dysarthria, post-stroke   . Dysphagia, post-stroke   . Leukocytosis   . Prediabetes   . Right hemiparesis ([place])   . Cerebral infarction due to unspecified mechanism   . Other secondary hypertension   . Overactive bladder   . Diastolic dysfunction   . Hyperlipidemia   . Essential hypertension   . Essential tremor   .  Stroke (cerebrum) ([place]) 02/01/2016  . Facial droop due to stroke 02/01/2016  . Essential and other specified forms of tremor 02/20/2013  . Dysphonia 02/20/2013  . Trigeminal neuralgia 02/20/2013    Rexanne Mano, PT 09/24/2016, 3:14 PM  [facility] [address] [place], [place], [address] Phone: [phone]   Fax:  [phone]  Name: Kelly [name] MRN: [id number] Date of Birth: 1928-03-24

## 2016-09-24 NOTE — Therapy (Signed)
[facility] [address] [place], [place], [address] Phone: [phone]   Fax:  [phone]  Occupational Therapy Treatment  Patient Details  Name: Kelly [name] MRN: [id number] Date of Birth: [birth year] Referring Provider: Cecille Rubin, NP  Encounter Date: 09/24/2016      OT End of Session - 09/24/16 1639    Visit Number 29   Number of Visits 30   Date for OT Re-Evaluation 09/28/16   Authorization Type Medicare / [employer] (g-code needed)   Authorization Time Period renewal completed 07/30/16   Authorization - Visit Number 64   Authorization - Number of Visits 30   OT Start Time 1405   OT Stop Time 1445   OT Time Calculation (min) 40 min   Activity Tolerance Patient tolerated treatment well   Behavior During Therapy [facility] for tasks assessed/performed      Past Medical History:  Diagnosis Date  . Acute blood loss anemia   . Adjustment disorder with depressed mood   . CVA (cerebral vascular accident) ([place]) 02/03/2016   Linear infarct within the posterior limb of left internal capsule  . Diastolic CHF ([place])   . Dysphagia   . Dysphonia 02/20/2013  . Essential and other specified forms of tremor 02/20/2013  . HLD (hyperlipidemia)   . HTN (hypertension)   . OAB (overactive bladder)   . Osteoporosis   . Patient receiving subcutaneous heparin    For DVT prophylaxis 9/17  . Prediabetes   . Psoriasis   . Slow transit constipation   . Stroke (cerebrum) ([place]) 02/01/2016  . Trigeminal neuralgia 02/20/2013    Past Surgical History:  Procedure Laterality Date  . APPENDECTOMY  1940  . bladder tack    . CATARACT EXTRACTION Bilateral   . HEMORRHOID SURGERY  1960  . HUMERUS FRACTURE SURGERY    . LAPAROSCOPIC HYSTERECTOMY  2006  . torn rotator cuff    . WRIST FRACTURE SURGERY  2005   seconday shoulder 2001    There were no vitals filed for this visit.      Subjective Assessment - 09/24/16 1637    Subjective   Pt reports that she has been working on tossing the ball   Pertinent History adjustment disorder with depressed mood, diastolic CHF, osteoporosis, hx of fall, essential tremor, HTN, prediabetes, trigeminal neuralgia   Limitations fall risk   Patient Stated Goals be able to write, fold clothes, do dishes   Currently in Pain? No/denies      Begin checking goals and discussed progress (see goals section below).  Writing 2 sentences with approx 85-90% legibility using built-up grip.  Pt reports continued difficulty cutting.  Mid-range functional reaching to grasp/release cylinder objects with min difficulty/drops.  In supine, tricep ext with 1lb wt with min faciliation/cues.                         OT Education - 09/24/16 1636    Education Details Reviewed updates to HEP (from last session)   Person(s) Educated Patient;Spouse   Methods Explanation;Demonstration;Verbal cues   Comprehension Verbalized understanding;Returned demonstration;Verbal cues required          OT Short Term Goals - 09/24/16 1642      OT SHORT TERM GOAL #1   Title -----------------     OT SHORT TERM GOAL #2   Title ----------------     OT SHORT TERM GOAL #3   Title --------------     OT SHORT TERM  GOAL #4   Title -----------------------     OT SHORT TERM GOAL #5   Title Pt will be able to cut food mod I.--check updated STGs 08/29/16   Period Weeks   Status On-going  08/24/16:  improved and is performing inconsistently     OT SHORT TERM GOAL #6   Title Pt will be able to write 2-3 sentences with 100% legibility.   Time 4   Period Weeks   Status On-going  08/25/16  approx 75% legibility today.  09/24/16  approx 85% legibility           OT Long Term Goals - 09/24/16 1431      OT LONG TERM GOAL #1   Title Pt will be independent with updated HEP--check updated LTGs 09/28/16   Time 8   Period Weeks   Status Achieved  07/28/16:  met with current HEP, but would benefit from updates.   Met. 09/24/16     OT LONG TERM GOAL #2   Title ---------------------------     OT LONG TERM GOAL #3   Title Pt will demo at least 110* R shoulder flex with min compensation in prep for functional reach.   Period Weeks   Status Achieved  07/28/16:  100* met initial goal.  met 08/25/16  at approx this level     OT LONG TERM GOAL #4   Title Pt will improve functional reaching and grasp/release as shown by improving score on box and blocks to at least 25.  Revised 07/09/16:  as pt met initial goal of improving by 10   Baseline 3 blocks   Time 8   Period Weeks   Status On-going  07/09/16  Met, and revised goal;  07/30/16  17 blocks.  09/24/16  21 blocks     OT LONG TERM GOAL #5   Title -------------------------------     OT LONG TERM GOAL #6   Title Pt will perform simple home maintenance tasks mod I.   Time 8   Period Weeks   Status Achieved  07/28/16  met folded clothes, washed dishes (supervision) and revised goal.  09/14/16  washing dishes, folding clothes     OT LONG TERM GOAL #7   Title Pt will demo improved coordination for ADLs as shown by completing 9-hole peg test in 91mn or less.   Time 8   Period Weeks   Status On-going  08/25/16:  placed 4 pegs in within 227m, completed in 15m57m37.79sec.  09/24/16:   2mi78m1.97sec               Plan - 09/24/16 1640    Clinical Impression Statement Pt is progressing towards goals.  She demo improved writing and coordination/functional reach today.     Rehab Potential Good   OT Frequency 2x / week   OT Duration --  9 weeks   OT Treatment/Interventions Self-care/ADL training;Therapeutic exercise;DME and/or AE instruction;FuncTherapist, nutritionalrapeutic activities;Patient/family education;Balance training;Neuromuscular education;Electrical Stimulation;Moist Heat;Energy conservation;Passive range of motion;Therapeutic exercises;Splinting;Manual Therapy;Fluidtherapy;Ultrasound;Cryotherapy;Parrafin   Plan check remaining goals (renewal  next week), cutting using rocker knife   OT Home Exercise Plan Education provided:  06/05/15 Initial HEP (table slides with light wt. bearing, tracing, and flipping cards); 06/22/16 supine ball ex and thumb opposition; 09/22/16  updates to HEP   Consulted and Agree with Plan of Care Patient   Family Member Consulted husband      Patient will benefit from skilled therapeutic intervention in order to improve the following deficits and  impairments:  Decreased coordination, Decreased activity tolerance, Decreased knowledge of use of DME, Decreased strength, Impaired UE functional use, Impaired tone, Decreased range of motion, Decreased balance, Decreased mobility, Difficulty walking  Visit Diagnosis: Hemiplegia and hemiparesis following cerebral infarction affecting right dominant side (HCC)  Other abnormalities of gait and mobility  Unsteadiness on feet  Muscle weakness (generalized)  Other lack of coordination  Other symptoms and signs involving the nervous system    Problem List Patient Active Problem List   Diagnosis Date Noted  . Slow transit constipation   . Acute blood loss anemia   . Adjustment disorder with depressed mood   . Cerebrovascular accident (CVA) ([place]) 02/03/2016  . Dysarthria, post-stroke   . Dysphagia, post-stroke   . Leukocytosis   . Prediabetes   . Right hemiparesis ([place])   . Cerebral infarction due to unspecified mechanism   . Other secondary hypertension   . Overactive bladder   . Diastolic dysfunction   . Hyperlipidemia   . Essential hypertension   . Essential tremor   . Stroke (cerebrum) ([place]) 02/01/2016  . Facial droop due to stroke 02/01/2016  . Essential and other specified forms of tremor 02/20/2013  . Dysphonia 02/20/2013  . Trigeminal neuralgia 02/20/2013    [facility] 09/24/2016, 4:45 PM  [facility] [address] [place], [place], [address] Phone: [phone]   Fax:   [phone]  Name: Kelly [name] MRN: [id number] Date of Birth: 1927-12-18   Vianne Bulls, OTR/L [facility] [address] [place], [place]  [address] [phone] phone [phone] 09/24/16 4:46 PM

## 2016-09-28 ENCOUNTER — Ambulatory Visit: Payer: Medicare Other | Admitting: Physical Therapy

## 2016-09-28 ENCOUNTER — Ambulatory Visit: Payer: Medicare Other | Admitting: Occupational Therapy

## 2016-09-28 ENCOUNTER — Encounter: Payer: Self-pay | Admitting: Physical Therapy

## 2016-09-28 DIAGNOSIS — I69351 Hemiplegia and hemiparesis following cerebral infarction affecting right dominant side: Secondary | ICD-10-CM

## 2016-09-28 DIAGNOSIS — M6281 Muscle weakness (generalized): Secondary | ICD-10-CM

## 2016-09-28 DIAGNOSIS — R278 Other lack of coordination: Secondary | ICD-10-CM

## 2016-09-28 DIAGNOSIS — R2681 Unsteadiness on feet: Secondary | ICD-10-CM | POA: Diagnosis not present

## 2016-09-28 DIAGNOSIS — R29818 Other symptoms and signs involving the nervous system: Secondary | ICD-10-CM

## 2016-09-28 DIAGNOSIS — R2689 Other abnormalities of gait and mobility: Secondary | ICD-10-CM

## 2016-09-28 NOTE — Therapy (Signed)
[facility] [address] [place], [place], [address] Phone: [phone]   Fax:  [phone]  Physical Therapy Treatment  Patient Details  Name: Kelly [name] MRN: [id number] Date of Birth: [birth year] Referring Provider: Cecille Rubin  Encounter Date: 09/28/2016      PT End of Session - 09/28/16 1327    Visit Number 29  G2 5/3 recert)   Number of Visits 52  recert 5/3 for 8 wks   Date for PT Re-Evaluation 11/20/16   Authorization Type Medicare/[place] [employer] every 10th visit   Authorization Time Period 07/30/16- 09/25/16;   09/24/16 to 11/20/16   PT Start Time 1318   PT Stop Time 1400   PT Time Calculation (min) 42 min   Equipment Utilized During Treatment Gait belt   Activity Tolerance Patient tolerated treatment well   Behavior During Therapy [facility] for tasks assessed/performed      Past Medical History:  Diagnosis Date  . Acute blood loss anemia   . Adjustment disorder with depressed mood   . CVA (cerebral vascular accident) ([place]) 02/03/2016   Linear infarct within the posterior limb of left internal capsule  . Diastolic CHF ([place])   . Dysphagia   . Dysphonia 02/20/2013  . Essential and other specified forms of tremor 02/20/2013  . HLD (hyperlipidemia)   . HTN (hypertension)   . OAB (overactive bladder)   . Osteoporosis   . Patient receiving subcutaneous heparin    For DVT prophylaxis 9/17  . Prediabetes   . Psoriasis   . Slow transit constipation   . Stroke (cerebrum) ([place]) 02/01/2016  . Trigeminal neuralgia 02/20/2013    Past Surgical History:  Procedure Laterality Date  . APPENDECTOMY  1940  . bladder tack    . CATARACT EXTRACTION Bilateral   . HEMORRHOID SURGERY  1960  . HUMERUS FRACTURE SURGERY    . LAPAROSCOPIC HYSTERECTOMY  2006  . torn rotator cuff    . WRIST FRACTURE SURGERY  2005   seconday shoulder 2001    There were no vitals filed for this visit.      Subjective Assessment -  09/28/16 1325    Subjective No new complaints. Continues to walk in park with spouse, taking transport chair for her to use as needed.    Patient is accompained by: Family member   Patient Stated Goals Pt's goal for therapy is to walk again.   Currently in Pain? No/denies   Pain Score 0-No pain            [facility] Adult PT Treatment/Exercise - 09/28/16 1328      Transfers   Transfers Sit to Stand;Stand to Sit   Sit to Stand 5: Supervision;With upper extremity assist;From bed;From chair/3-in-1   Stand to Sit 5: Supervision;With upper extremity assist;To bed;To chair/3-in-1     Ambulation/Gait   Ambulation/Gait Yes   Ambulation/Gait Assistance 4: Min guard;5: Supervision   Ambulation/Gait Assistance Details occasional cues for foot clearance with gait, especially on uneven surfaces. no significant balance issues noted with having pt scan enviroment with gait as well.                                                Ambulation Distance (Feet) 620 Feet   Assistive device Rolling walker   Gait Pattern Step-through pattern;Decreased stride length;Decreased dorsiflexion - right;Right genu recurvatum;Poor  foot clearance - right   Ambulation Surface Level;Unlevel;Indoor;Outdoor;Paved     Knee/Hip Exercises: Standing   Terminal Knee Extension AROM;Strengthening;Right;1 set;10 reps;Theraband;Limitations   Theraband Level (Terminal Knee Extension) Level 2 (Red)   Terminal Knee Extension Limitations with 5 sec holds each rep           Balance Exercises - 09/28/16 1347      Balance Exercises: Standing   SLS with Vectors Solid surface;Upper extremity assist 1;Other reps (comment);Limitations   Other Standing Exercises standing with RW support: right stance with manual assist for knee stability- left foot taps to black foam bolster x 10 reps; left forward/backward stepping over black foam bolster x 10 reps. cues for increased right quad activation for improved knee control with activities.        Balance Exercises: Standing   SLS with Vectors Limitations 2 tall cones on floor with right UE HHA/PTA providing stability to right knee to prevent hyperextension:  alternating forward toe taps x 10 each leg, alternating cross toe taps x 10 reps each leg. min assist with cues on weight shifting and posture to assist with balance.                                    PT Short Term Goals - 09/25/16 1508      PT SHORT TERM GOAL #1   Title Pt will perform HEP with family's supervision, to address balance, gait, transfers and strengthening.  TARGET 07/01/16 (NEW TARGET DATE FOR ALL CURRENT STGs 08/30/16)   Baseline 06/29/16: met per pt and spouse report   Status Achieved     PT SHORT TERM GOAL #2   Title Pt will improve Berg Balance score to at least 26/56 for decreased fall risk.   Baseline 06/29/16: 30/56 scored today   Status Achieved     PT SHORT TERM GOAL #3   Title Pt will improve TUG score to less than or equal to 50 seconds for decreased fall risk.   Baseline 06/29/16: 23.81 with RW   Status Achieved     PT SHORT TERM GOAL #4   Title Pt will transfer sit<>stand, modified independently, at least 4 of 5 trials, to demo improved lower extremity strength and transfer efficiency.   Baseline 06/29/16: pt at supervision level with cues for safety/sequencing needed at times   Status Not Met     PT SHORT TERM GOAL #5   Title Pt will ambulate at least 200 ft using RW, supervision, for improved gait safety and efficiency.   Baseline 06/29/16: met today with RW   Status Achieved     Additional Short Term Goals   Additional Short Term Goals Yes     PT SHORT TERM GOAL #6   Title Pt/family will verbalize understanding of CVA education.   Time 4   Period Weeks   Status Achieved     PT SHORT TERM GOAL #7   Title ----------------------------------------------------------------------------------------------------     PT SHORT TERM GOAL #8   Title Patient will demonstrate sit to stand 8 of 10  times with correct sequencing for safety with supervision but no cues. (TARGET for STGs 10-23-16)   Time 4   Period Weeks   Status New     PT SHORT TERM GOAL #9   TITLE Patient will ascend/descend curb with RW with minguard assist (no physical assist, but close proximity) without verbal cues.    Time  4   Period Weeks   Status New           PT Long Term Goals - 09/24/16 1451      PT LONG TERM GOAL #1   Title Pt/family will verbalize understanding of fall prevention in the home environment.  TARGET 07/31/16 (NEW TARGET DATE FOR ALL CURRENT LTGS 09/25/16)  5/4 MET   Time 8   Period Weeks   Status Achieved     PT LONG TERM GOAL #2   Title Pt will improve Berg Balance score to at least 31/56 for decreased fall risk.  09/24/16   32/56   Time 8   Period Weeks   Status Achieved     PT LONG TERM GOAL #3   Title Pt will improve TUG score to less than or equal to 45 seconds for decreased fall risk.     Baseline 33.41 sec 07/28/16  5/3 23.21 sec   Time 8   Period Weeks   Status Achieved     PT LONG TERM GOAL #4   Title Pt will improve gait velocity to at least 1.8 ft/sec for improved gait efficiency and safety.   Baseline 1.56 ft/sec with RW 07/28/16;  09/24/16  2.23 ft/sec   Time 8   Period Weeks   Status Achieved     PT LONG TERM GOAL #5   Title Pt will ambulate at least 800 ft indoor and outdoor surfaces, using RW, modified independently, for improved outdoor and community. (NEW TARGET date 11-20-16)   Baseline 5/2 did 600 ft   Time 8   Period Weeks   Status On-going     Additional Long Term Goals   Additional Long Term Goals Yes     PT LONG TERM GOAL #6   Title Pt will improve Neuro QOL score on FOTO by at least 20% for improved functional mobility. (NEW TARGET date 11-20-16)   Time 8   Period Weeks   Status On-going     PT LONG TERM GOAL #7   Title Patient will increase Berg score to 39/56 to indicate lesser fall risk. (TARGET date 11-20-16)   Time 8   Period Weeks   Status  New     PT LONG TERM GOAL #8   Title Patient will increase her gait velocity to >=2.45 ft/sec indicative of increased safety with community ambulation. (TARGET date 11-20-16)   Time 8   Period Weeks   Status New           Plan - 09/28/16 1327    Clinical Impression Statement Today's skilled session continued to address gait with emphasis on distance and dual tasking/enviromental scanning with no significant issues noted. Remainder of session addressed right knee stability/strengthening to work towards decreased recurvatum. Pt is making steady progress toward goals and should benefit from continued PT to progress toward unmet goals.    Rehab Potential Good   PT Frequency 2x / week   PT Duration 8 weeks   PT Treatment/Interventions ADLs/Self Care Home Management;Functional mobility training;Stair training;Gait training;Patient/family education;Neuromuscular re-education;Balance training;Therapeutic exercise;Therapeutic activities;DME Instruction   PT Next Visit Plan  standing balance with R knee control ; gait distance with ramps/curbs included   Consulted and Agree with Plan of Care Patient;Family member/caregiver   Family Member Consulted Husband      Patient will benefit from skilled therapeutic intervention in order to improve the following deficits and impairments:  Abnormal gait, Decreased balance, Decreased cognition, Decreased knowledge of use of DME, Decreased  range of motion, Decreased safety awareness, Decreased strength  Visit Diagnosis: Hemiplegia and hemiparesis following cerebral infarction affecting right dominant side (HCC)  Other abnormalities of gait and mobility  Unsteadiness on feet  Muscle weakness (generalized)     Problem List Patient Active Problem List   Diagnosis Date Noted  . Slow transit constipation   . Acute blood loss anemia   . Adjustment disorder with depressed mood   . Cerebrovascular accident (CVA) ([place]) 02/03/2016  . Dysarthria,  post-stroke   . Dysphagia, post-stroke   . Leukocytosis   . Prediabetes   . Right hemiparesis ([place])   . Cerebral infarction due to unspecified mechanism   . Other secondary hypertension   . Overactive bladder   . Diastolic dysfunction   . Hyperlipidemia   . Essential hypertension   . Essential tremor   . Stroke (cerebrum) ([place]) 02/01/2016  . Facial droop due to stroke 02/01/2016  . Essential and other specified forms of tremor 02/20/2013  . Dysphonia 02/20/2013  . Trigeminal neuralgia 02/20/2013    Willow Ora, PTA, [facility] [address], [place], [place] [address] [phone] 09/28/16, 7:34 PM   Name: Kelly [name] MRN: [id number] Date of Birth: [birth year]

## 2016-09-28 NOTE — Therapy (Signed)
[facility] [address] [place], [place], [address] Phone: [phone]   Fax:  [phone]  Occupational Therapy Treatment  Patient Details  Name: Kelly [name] MRN: [id number] Date of Birth: [birth year] Referring Provider: Cecille Rubin, NP  Encounter Date: 09/28/2016      OT End of Session - 09/28/16 1406    Visit Number 30   Number of Visits 46  30+16=46   Date for OT Re-Evaluation 11/27/16   Authorization Type Medicare / [employer] (g-code needed)   Authorization Time Period renewal completed 07/30/16; renewal completed 09/28/16-11/27/16   Authorization - Visit Number 45   Authorization - Number of Visits 40   OT Start Time 1405   OT Stop Time 1445   OT Time Calculation (min) 40 min   Activity Tolerance Patient tolerated treatment well   Behavior During Therapy [facility] for tasks assessed/performed      Past Medical History:  Diagnosis Date  . Acute blood loss anemia   . Adjustment disorder with depressed mood   . CVA (cerebral vascular accident) ([place]) 02/03/2016   Linear infarct within the posterior limb of left internal capsule  . Diastolic CHF ([place])   . Dysphagia   . Dysphonia 02/20/2013  . Essential and other specified forms of tremor 02/20/2013  . HLD (hyperlipidemia)   . HTN (hypertension)   . OAB (overactive bladder)   . Osteoporosis   . Patient receiving subcutaneous heparin    For DVT prophylaxis 9/17  . Prediabetes   . Psoriasis   . Slow transit constipation   . Stroke (cerebrum) ([place]) 02/01/2016  . Trigeminal neuralgia 02/20/2013    Past Surgical History:  Procedure Laterality Date  . APPENDECTOMY  1940  . bladder tack    . CATARACT EXTRACTION Bilateral   . HEMORRHOID SURGERY  1960  . HUMERUS FRACTURE SURGERY    . LAPAROSCOPIC HYSTERECTOMY  2006  . torn rotator cuff    . WRIST FRACTURE SURGERY  2005   seconday shoulder 2001    There were no vitals filed for this visit.      Subjective  Assessment - 09/28/16 1405    Subjective  has been walking more at the park, but pt reports that she feels that she can't hold the ball as good as before (hands feel tighter)   Pertinent History adjustment disorder with depressed mood, diastolic CHF, osteoporosis, hx of fall, essential tremor, HTN, prediabetes, trigeminal neuralgia   Limitations fall risk   Patient Stated Goals be able to write, fold clothes, do dishes   Currently in Pain? No/denies       Fabricated full composite wrist/finger splint as able to assist with spasticity management.    Flipping large cards with focus on supination and finger extension with min difficulty--improved.                       OT Education - 09/28/16 1932    Education Details composite wrist/finger splint wear and donning (wear 20 min 2x/day).  also recommended pt resume wearing resting hand splint at night   Person(s) Educated Patient;Spouse   Methods Explanation;Demonstration;Verbal cues;Tactile cues   Comprehension Verbalized understanding;Returned demonstration          OT Short Term Goals - 09/28/16 1941      OT SHORT TERM GOAL #1   Title -----------------     OT SHORT TERM GOAL #2   Title ----------------     OT SHORT TERM GOAL #3  Title --------------     OT SHORT TERM GOAL #4   Title -----------------------     OT SHORT TERM GOAL #5   Title Pt will be able to cut food mod I.--check updated STGs 10/28/16   Status On-going  08/24/16:  improved and is performing inconsistently;  09/28/16  continues to have difficulty     OT SHORT TERM GOAL #6   Title Pt will be able to write 2-3 sentences with 100% legibility.   Status On-going  08/25/16  approx 75% legibility today.  09/24/16  approx 85% legibility     OT SHORT TERM GOAL #7   Title Pt will be able to reach to retrieve cylinder objects with RUE from at least 75* shoulder flex with 80% accuracy.   Status New           OT Long Term Goals - 09/28/16 1948       OT LONG TERM GOAL #1   Title Pt will be independent with updated HEP--check updated LTGs 09/28/16   Time 8   Period Weeks   Status Achieved  07/28/16:  met with current HEP, but would benefit from updates.  Met. 09/24/16     OT LONG TERM GOAL #2   Title ---------------------------     OT LONG TERM GOAL #3   Title Pt will demo at least 110* R shoulder flex with min compensation in prep for functional reach.   Period Weeks   Status Achieved  07/28/16:  100* met initial goal.  met 08/25/16  at approx this level     OT LONG TERM GOAL #4   Title Pt will improve functional reaching and grasp/release as shown by improving score on box and blocks to at least 25.--check updated LTGs 11/27/16  Revised 07/09/16:  as pt met initial goal of improving by 10   Baseline 3 blocks   Period Weeks   Status On-going  07/09/16  Met, and revised goal;  07/30/16  17 blocks.  09/24/16  21 blocks     OT LONG TERM GOAL #5   Title -------------------------------     OT LONG TERM GOAL #6   Title Pt will perform simple home maintenance tasks mod I.   Time 8   Period Weeks   Status Achieved  07/28/16  met folded clothes, washed dishes (supervision) and revised goal.  09/14/16  washing dishes, folding clothes     OT LONG TERM GOAL #7   Title Pt will demo improved coordination for ADLs as shown by completing 9-hole peg test in 69mn or less.   Period Weeks   Status On-going  08/25/16:  placed 4 pegs in within 225m, completed in 25m87m37.79sec.  09/24/16:   2mi9m1.97sec     OT LONG TERM GOAL #8   Title Pt will be able to reach to retrieve cylinder objects with RUE from at least 85* shoulder flex with 80% accuracy.   Period Weeks   Status New     OT LONG TERM GOAL  #9   Baseline Pt will be able to manipulate 1 inch object in R hand with at least 75% accuracy.   Status New               Plan - 09/28/16 1935    Clinical Impression Statement Pt is making progress towards goals with improving coordination and R  shoulder ROM for functional reach.  Pt continues to demo difficulty with combined movements with RUE with spasticity and fatigue as  barriers.  Pt recently received Botox injections.  Pt would benefit from further occupational therapy to address combined RUE functional movements for incr RUE functional use in ADLs/IADLs, particularly s/p Botox injections.    Rehab Potential Good   OT Frequency 2x / week   OT Duration --  9 weeks   OT Treatment/Interventions Self-care/ADL training;Therapeutic exercise;DME and/or AE instruction;Therapist, nutritional;Therapeutic activities;Patient/family education;Balance training;Neuromuscular education;Electrical Stimulation;Moist Heat;Energy conservation;Passive range of motion;Therapeutic exercises;Splinting;Manual Therapy;Fluidtherapy;Ultrasound;Cryotherapy;Parrafin   Plan cutting with rocker knife, check on splint wear   OT Home Exercise Plan Education provided:  06/05/15 Initial HEP (table slides with light wt. bearing, tracing, and flipping cards); 06/22/16 supine ball ex and thumb opposition; 09/22/16  updates to HEP   Consulted and Agree with Plan of Care Patient   Family Member Consulted husband      Patient will benefit from skilled therapeutic intervention in order to improve the following deficits and impairments:  Decreased coordination, Decreased activity tolerance, Decreased knowledge of use of DME, Decreased strength, Impaired UE functional use, Impaired tone, Decreased range of motion, Decreased balance, Decreased mobility, Difficulty walking  Visit Diagnosis: Hemiplegia and hemiparesis following cerebral infarction affecting right dominant side (HCC)  Other abnormalities of gait and mobility  Unsteadiness on feet  Muscle weakness (generalized)  Other lack of coordination  Other symptoms and signs involving the nervous system      G-Codes - 10/05/16 1951    Functional Assessment Tool Used (Outpatient only) 9-hole peg test:   R 47mn  11.97sec.  Box and blocks test:  R-21 blocks   Functional Limitation Carrying, moving and handling objects   Carrying, Moving and Handling Objects Current Status [phone] At least 40 percent but less than 60 percent impaired, limited or restricted   Carrying, Moving and Handling Objects Goal Status (([id number] At least 20 percent but less than 40 percent impaired, limited or restricted      Occupational Therapy Progress Note  Dates of Reporting Period: 08/25/16 to 5May 14, 2018 Objective Reports of Subjective Statement: see above  Objective Measurements: see above  Goal Update: see above  Plan:  See above  Reason Skilled Services are Required: see above     Problem List Patient Active Problem List   Diagnosis Date Noted  . Slow transit constipation   . Acute blood loss anemia   . Adjustment disorder with depressed mood   . Cerebro[place] ([place]A) ([place] 02/03/[place]  . Dysarthria, post-stroke   . Dysphagia, post-stroke   . Leukocytosis   . Prediabetes   . Right hemiparesis ([place]   . Cerebral infarction due to unspecified mechanism   . Other secondary hypertension   . Overactive bladder   . Diastolic dysfunction   . Hyperlipidemia   . Essential hypertension   . Essential tremor   . St[place] ([place]rebrum) ([place] 02/01/2016  . Facial droop due to stroke 02/01/2016  . Essential and other specified forms of tremor 02/20/2013  . Dysphonia 02/20/2013  . Trigeminal neuralgia 02/20/2013    [facility]505/14/2018 7:59 PM  CForest [place]9534 Lilac StreetSBostonGClements N[place] [address]Phone: [phone]  Fax:  [phone] Name: BLUCAS EXLINEMRN: [id number] of Birth: 708-02-1928  AVianne Bulls OTR/L CColumb[facility] Hospital[address] SNavajoGBlairsville [place]  [phone] [phone] 14, 20187:59 PM

## 2016-09-30 ENCOUNTER — Ambulatory Visit: Payer: Medicare Other | Admitting: Physical Therapy

## 2016-09-30 ENCOUNTER — Encounter: Payer: Self-pay | Admitting: Physical Therapy

## 2016-09-30 DIAGNOSIS — R29818 Other symptoms and signs involving the nervous system: Secondary | ICD-10-CM | POA: Diagnosis not present

## 2016-09-30 DIAGNOSIS — R2681 Unsteadiness on feet: Secondary | ICD-10-CM | POA: Diagnosis not present

## 2016-09-30 DIAGNOSIS — R278 Other lack of coordination: Secondary | ICD-10-CM | POA: Diagnosis not present

## 2016-09-30 DIAGNOSIS — R2689 Other abnormalities of gait and mobility: Secondary | ICD-10-CM | POA: Diagnosis not present

## 2016-09-30 DIAGNOSIS — M6281 Muscle weakness (generalized): Secondary | ICD-10-CM

## 2016-09-30 DIAGNOSIS — I69351 Hemiplegia and hemiparesis following cerebral infarction affecting right dominant side: Secondary | ICD-10-CM | POA: Diagnosis not present

## 2016-09-30 NOTE — Patient Instructions (Signed)
Axial Extension (Chin Tuck)    Lying down with thin pillow or folded towel under the back of your head. Pull chin in (like something smells bad)and lengthen back of neck. Hold __5__ seconds while counting out loud. Repeat __10__ times. Do __1__ sessions per day.  http://gt2.exer.us/450   Copyright  VHI. All rights reserved.

## 2016-09-30 NOTE — Therapy (Signed)
[facility] [address] [place], [place], [address] Phone: [phone]   Fax:  [phone]  Physical Therapy Treatment  Patient Details  Name: Kelly [name] MRN: [id number] Date of Birth: 01-29-1928 Referring Provider: Cecille Rubin  Encounter Date: 09/30/2016      PT End of Session - 09/30/16 1214    Visit Number 30  G2 5/3 recert)   Number of Visits 52  recert 5/3 for 8 wks   Date for PT Re-Evaluation 11/20/16   Authorization Type Medicare/[place] [employer]-G[place] every 10th visit   Authorization Time Period 07/30/16- 09/25/16;   09/24/16 to 11/20/16   PT Start Time 1106   PT Stop Time 1151   PT Time Calculation (min) 45 min   Equipment Utilized During Treatment Gait belt   Activity Tolerance Patient tolerated treatment well   Behavior During Therapy [facility] for tasks assessed/performed      Past Medical History:  Diagnosis Date  . Acute blood loss anemia   . Adjustment disorder with depressed mood   . CVA (cerebral vascular accident) ([place]) 02/03/2016   Linear infarct within the posterior limb of left internal capsule  . Diastolic CHF ([place])   . Dysphagia   . Dysphonia 02/20/2013  . Essential and other specified forms of tremor 02/20/2013  . HLD (hyperlipidemia)   . HTN (hypertension)   . OAB (overactive bladder)   . Osteoporosis   . Patient receiving subcutaneous heparin    For DVT prophylaxis 9/17  . Prediabetes   . Psoriasis   . Slow transit constipation   . Stroke (cerebrum) ([place]) 02/01/2016  . Trigeminal neuralgia 02/20/2013    Past Surgical History:  Procedure Laterality Date  . APPENDECTOMY  1940  . bladder tack    . CATARACT EXTRACTION Bilateral   . HEMORRHOID SURGERY  1960  . HUMERUS FRACTURE SURGERY    . LAPAROSCOPIC HYSTERECTOMY  2006  . torn rotator cuff    . WRIST FRACTURE SURGERY  2005   seconday shoulder 2001    There were no vitals filed for this visit.      Subjective Assessment -  09/30/16 1106    Subjective Toe is not hurting today. Having a larger size shoe fixed with leather toe cap. No falls.    Patient is accompained by: Family member   Patient Stated Goals Pt's goal for therapy is to walk again.   Currently in Pain? No/denies                         [facility] Adult PT Treatment/Exercise - 09/30/16 0001      Bed Mobility   Bed Mobility Sit to Supine;Supine to Sit   Supine to Sit 6: Modified independent (Device/Increase time)   Sit to Supine 6: Modified independent (Device/Increase time)     Transfers   Transfers Sit to Stand;Stand to Sit   Sit to Stand 5: Supervision;With upper extremity assist;From bed;From chair/3-in-1   Sit to Stand Details (indicate cue type and reason) required vc the first transfer for safe hand placement/use of RW; every transfer after she performed safely   Stand to Sit 5: Supervision;With upper extremity assist;To bed;To chair/3-in-1   Stand to Sit Details supervision due to LE fatigue (for safety)     Ambulation/Gait   Ambulation/Gait Assistance 4: Min guard;5: Supervision   Ambulation/Gait Assistance Details cues for upright posture (not looking down at floor 3 ft ahead of her); no loss of balance  Ambulation Distance (Feet) 100 Feet  40, 40, 60   Assistive device Rolling walker   Gait Pattern Step-through pattern;Decreased stride length;Decreased dorsiflexion - right;Right genu recurvatum;Poor foot clearance - right   Ambulation Surface Level;Indoor     Posture/Postural Control   Posture/Postural Control Postural limitations   Postural Limitations Rounded Shoulders;Forward head;Increased thoracic kyphosis   Posture Comments back to wall and tried chin tucks without success; in supine pt able to tolerate supine with folded towel ~1" and able to perform chin tuck     Lumbar Exercises: Standing   Wall Slides 10 reps     Knee/Hip Exercises: Standing   Functional Squat 1 set;10 reps  in // bars on blue  beam/foam crosswise             Balance Exercises - 09/30/16 1209      Balance Exercises: Standing   Standing Eyes Opened Narrow base of support (BOS);Head turns;Solid surface;5 reps;30 secs  feet together, tandem Rt/Lt   Standing Eyes Closed Narrow base of support (BOS);Head turns;Solid surface;5 reps;30 secs  feet together, tandem Rt/Lt   Step Ups Forward;2 inch  blue beam in // bars; and posterior   Tandem Gait Forward;Intermittent upper extremity support  // bars   Sidestepping Upper extremity support;2 reps  // bars           PT Education - 09/30/16 1213    Education provided Yes   Education Details chin tucks for HEP; focus on upright posture with gait   Person(s) Educated Patient;Spouse   Methods Explanation;Demonstration;Tactile cues;Verbal cues;Handout   Comprehension Verbalized understanding;Returned demonstration;Verbal cues required;Tactile cues required;Need further instruction          PT Short Term Goals - 09/25/16 1508      PT SHORT TERM GOAL #1   Title Pt will perform HEP with family's supervision, to address balance, gait, transfers and strengthening.  TARGET 07/01/16 (NEW TARGET DATE FOR ALL CURRENT STGs 08/30/16)   Baseline 06/29/16: met per pt and spouse report   Status Achieved     PT SHORT TERM GOAL #2   Title Pt will improve Berg Balance score to at least 26/56 for decreased fall risk.   Baseline 06/29/16: 30/56 scored today   Status Achieved     PT SHORT TERM GOAL #3   Title Pt will improve TUG score to less than or equal to 50 seconds for decreased fall risk.   Baseline 06/29/16: 23.81 with RW   Status Achieved     PT SHORT TERM GOAL #4   Title Pt will transfer sit<>stand, modified independently, at least 4 of 5 trials, to demo improved lower extremity strength and transfer efficiency.   Baseline 06/29/16: pt at supervision level with cues for safety/sequencing needed at times   Status Not Met     PT SHORT TERM GOAL #5   Title Pt will  ambulate at least 200 ft using RW, supervision, for improved gait safety and efficiency.   Baseline 06/29/16: met today with RW   Status Achieved     Additional Short Term Goals   Additional Short Term Goals Yes     PT SHORT TERM GOAL #6   Title Pt/family will verbalize understanding of CVA education.   Time 4   Period Weeks   Status Achieved     PT SHORT TERM GOAL #7   Title ----------------------------------------------------------------------------------------------------     PT SHORT TERM GOAL #8   Title Patient will demonstrate sit to stand 8 of 10 times  with correct sequencing for safety with supervision but no cues. (TARGET for STGs 10-23-16)   Time 4   Period Weeks   Status New     PT SHORT TERM GOAL #9   TITLE Patient will ascend/descend curb with RW with minguard assist (no physical assist, but close proximity) without verbal cues.    Time 4   Period Weeks   Status New           PT Long Term Goals - 09/24/16 1451      PT LONG TERM GOAL #1   Title Pt/family will verbalize understanding of fall prevention in the home environment.  TARGET 07/31/16 (NEW TARGET DATE FOR ALL CURRENT LTGS 09/25/16)  5/4 MET   Time 8   Period Weeks   Status Achieved     PT LONG TERM GOAL #2   Title Pt will improve Berg Balance score to at least 31/56 for decreased fall risk.  09/24/16   32/56   Time 8   Period Weeks   Status Achieved     PT LONG TERM GOAL #3   Title Pt will improve TUG score to less than or equal to 45 seconds for decreased fall risk.     Baseline 33.41 sec 07/28/16  5/3 23.21 sec   Time 8   Period Weeks   Status Achieved     PT LONG TERM GOAL #4   Title Pt will improve gait velocity to at least 1.8 ft/sec for improved gait efficiency and safety.   Baseline 1.56 ft/sec with RW 07/28/16;  09/24/16  2.23 ft/sec   Time 8   Period Weeks   Status Achieved     PT LONG TERM GOAL #5   Title Pt will ambulate at least 800 ft indoor and outdoor surfaces, using RW, modified  independently, for improved outdoor and community. (NEW TARGET date 11-20-16)   Baseline 5/2 did 600 ft   Time 8   Period Weeks   Status On-going     Additional Long Term Goals   Additional Long Term Goals Yes     PT LONG TERM GOAL #6   Title Pt will improve Neuro QOL score on FOTO by at least 20% for improved functional mobility. (NEW TARGET date 11-20-16)   Time 8   Period Weeks   Status On-going     PT LONG TERM GOAL #7   Title Patient will increase Berg score to 39/56 to indicate lesser fall risk. (TARGET date 11-20-16)   Time 8   Period Weeks   Status New     PT LONG TERM GOAL #8   Title Patient will increase her gait velocity to >=2.45 ft/sec indicative of increased safety with community ambulation. (TARGET date 11-20-16)   Time 8   Period Weeks   Status New               Plan - 09/30/16 1215    Clinical Impression Statement Patient continues to make progress with balance and gait training. She demonstrated ability to control Rt knee to prevent hyperextension during exercises, however continues to have recurvatum with gait. Patient reports feeling like she is getting pulled forward when walking and worked on upright posture/stretching/strengthening.    Rehab Potential Good   PT Frequency 2x / week   PT Duration 8 weeks   PT Treatment/Interventions ADLs/Self Care Home Management;Functional mobility training;Stair training;Gait training;Patient/family education;Neuromuscular re-education;Balance training;Therapeutic exercise;Therapeutic activities;DME Instruction   PT Next Visit Plan  review chin tuck in supine (  added to HEP 5/9) and try to progress to sitting/standing; standing balance with R knee control ; gait distance with ramps/curbs included   Consulted and Agree with Plan of Care Patient;Family member/caregiver   Family Member Consulted Husband      Patient will benefit from skilled therapeutic intervention in order to improve the following deficits and  impairments:  Abnormal gait, Decreased balance, Decreased cognition, Decreased knowledge of use of DME, Decreased range of motion, Decreased safety awareness, Decreased strength  Visit Diagnosis: Other abnormalities of gait and mobility  Unsteadiness on feet  Muscle weakness (generalized)     Problem List Patient Active Problem List   Diagnosis Date Noted  . Slow transit constipation   . Acute blood loss anemia   . Adjustment disorder with depressed mood   . Cerebrovascular accident (CVA) ([place]) 02/03/2016  . Dysarthria, post-stroke   . Dysphagia, post-stroke   . Leukocytosis   . Prediabetes   . Right hemiparesis ([place])   . Cerebral infarction due to unspecified mechanism   . Other secondary hypertension   . Overactive bladder   . Diastolic dysfunction   . Hyperlipidemia   . Essential hypertension   . Essential tremor   . Stroke (cerebrum) ([place]) 02/01/2016  . Facial droop due to stroke 02/01/2016  . Essential and other specified forms of tremor 02/20/2013  . Dysphonia 02/20/2013  . Trigeminal neuralgia 02/20/2013    Rexanne Mano, PT 09/30/2016, 12:20 PM  [facility] [address] [place], [place], [address] Phone: [phone]   Fax:  [phone]  Name: PEPPER KERRICK MRN: [id number] Date of Birth: 1927-08-08

## 2016-10-01 ENCOUNTER — Ambulatory Visit: Payer: Medicare Other | Admitting: Occupational Therapy

## 2016-10-01 DIAGNOSIS — R2681 Unsteadiness on feet: Secondary | ICD-10-CM

## 2016-10-01 DIAGNOSIS — I69351 Hemiplegia and hemiparesis following cerebral infarction affecting right dominant side: Secondary | ICD-10-CM | POA: Diagnosis not present

## 2016-10-01 DIAGNOSIS — H6123 Impacted cerumen, bilateral: Secondary | ICD-10-CM | POA: Diagnosis not present

## 2016-10-01 DIAGNOSIS — R278 Other lack of coordination: Secondary | ICD-10-CM | POA: Diagnosis not present

## 2016-10-01 DIAGNOSIS — M6281 Muscle weakness (generalized): Secondary | ICD-10-CM

## 2016-10-01 DIAGNOSIS — R2689 Other abnormalities of gait and mobility: Secondary | ICD-10-CM | POA: Diagnosis not present

## 2016-10-01 DIAGNOSIS — R29818 Other symptoms and signs involving the nervous system: Secondary | ICD-10-CM | POA: Diagnosis not present

## 2016-10-01 DIAGNOSIS — Z6822 Body mass index (BMI) 22.0-22.9, adult: Secondary | ICD-10-CM | POA: Diagnosis not present

## 2016-10-01 DIAGNOSIS — H9190 Unspecified hearing loss, unspecified ear: Secondary | ICD-10-CM | POA: Diagnosis not present

## 2016-10-01 NOTE — Therapy (Signed)
[place] [address] [place], [place], [address] Phone: [phone]   Fax:  [phone]  Occupational Therapy Treatment  Patient Details  Name: Kelly [name] MRN: [id number] Date of Birth: 02/27/1928 Referring Provider: Cecille Rubin, NP  Encounter Date: 10/01/2016      OT End of Session - 10/01/16 1704    Visit Number 31   Number of Visits 8  30+16=46   Date for OT Re-Evaluation 11/27/16   Authorization Type Medicare / [employer] (g-code needed)   Authorization Time Period renewal completed 07/30/16; renewal completed 09/28/16-11/27/16   Authorization - Visit Number 13   Authorization - Number of Visits 40   OT Start Time 1534   OT Stop Time 1619   OT Time Calculation (min) 45 min   Activity Tolerance Patient tolerated treatment well   Behavior During Therapy [facility] for tasks assessed/performed      Past Medical History:  Diagnosis Date  . Acute blood loss anemia   . Adjustment disorder with depressed mood   . CVA (cerebral vascular accident) ([place]) 02/03/2016   Linear infarct within the posterior limb of left internal capsule  . Diastolic CHF ([place])   . Dysphagia   . Dysphonia 02/20/2013  . Essential and other specified forms of tremor 02/20/2013  . HLD (hyperlipidemia)   . HTN (hypertension)   . OAB (overactive bladder)   . Osteoporosis   . Patient receiving subcutaneous heparin    For DVT prophylaxis 9/17  . Prediabetes   . Psoriasis   . Slow transit constipation   . Stroke (cerebrum) ([place]) 02/01/2016  . Trigeminal neuralgia 02/20/2013    Past Surgical History:  Procedure Laterality Date  . APPENDECTOMY  1940  . bladder tack    . CATARACT EXTRACTION Bilateral   . HEMORRHOID SURGERY  1960  . HUMERUS FRACTURE SURGERY    . LAPAROSCOPIC HYSTERECTOMY  2006  . torn rotator cuff    . WRIST FRACTURE SURGERY  2005   seconday shoulder 2001    There were no vitals filed for this visit.      Subjective  Assessment - 10/01/16 1651    Subjective  Husband reports that pt gets frustrated with things at home.     Pertinent History adjustment disorder with depressed mood, diastolic CHF, osteoporosis, hx of fall, essential tremor, HTN, prediabetes, trigeminal neuralgia   Limitations fall risk   Patient Stated Goals be able to write, fold clothes, do dishes   Currently in Pain? No/denies        Pt instructed in use of rocker knife ("vertical handle/t-shaped) and was able to use for simulated cutting (of putty) with min difficulty with R hand.  Copying 2 sentences with approx 75% legibility with incr time/difficulty.  Pt needed cueing to move paper as she wrote along page and min-mod prompts for encouragement.  In standing, functional grasp/release of cylinder pegs to place in pegboard arc (various sizes) with min difficulty and incorporating low-range reach.  Arm bike x69mn level 1 for reciprocal movement without rest.  Reviewed effects of Botox and how it helps with spasticity by partially weakening muscles.  Pt/husband verbalized understanding.  PROM to R wrist/hand in extension prior to functional activities.         OT Education - 10/01/16 1703    Education Details Rocker Knife Use and where to purchase     Person(s) Educated Patient;Spouse   Methods Explanation;Demonstration;Verbal cues   Comprehension Verbalized understanding;Returned demonstration  OT Short Term Goals - 09/28/16 1941      OT SHORT TERM GOAL #1   Title -----------------     OT SHORT TERM GOAL #2   Title ----------------     OT SHORT TERM GOAL #3   Title --------------     OT SHORT TERM GOAL #4   Title -----------------------     OT SHORT TERM GOAL #5   Title Pt will be able to cut food mod I.--check updated STGs 10/28/16   Status On-going  08/24/16:  improved and is performing inconsistently;  09/28/16  continues to have difficulty     OT SHORT TERM GOAL #6   Title Pt will be able to write  2-3 sentences with 100% legibility.   Status On-going  08/25/16  approx 75% legibility today.  09/24/16  approx 85% legibility     OT SHORT TERM GOAL #7   Title Pt will be able to reach to retrieve cylinder objects with RUE from at least 75* shoulder flex with 80% accuracy.   Status New           OT Long Term Goals - 10/01/16 1706      OT LONG TERM GOAL #1   Title --------------------------------   Time --   Period --     OT LONG TERM GOAL #2   Title ---------------------------     OT LONG TERM GOAL #3   Title ----------------------------------------   Period --     OT LONG TERM GOAL #4   Title Pt will improve functional reaching and grasp/release as shown by improving score on box and blocks to at least 25.--check updated LTGs 11/27/16  Revised 07/09/16:  as pt met initial goal of improving by 10   Baseline 3 blocks   Period Weeks   Status On-going  07/09/16  Met, and revised goal;  07/30/16  17 blocks.  09/24/16  21 blocks     OT LONG TERM GOAL #5   Title -------------------------------     OT LONG TERM GOAL #6   Time --     OT LONG TERM GOAL #7   Title Pt will demo improved coordination for ADLs as shown by completing 9-hole peg test in or less.   Period Weeks   Status On-going  08/25/16:  placed 4 pegs in within , completed in 37.79sec.  09/24/16:   11.97sec     OT LONG TERM GOAL #8   Title Pt will be able to reach to retrieve cylinder objects with RUE from at least 85* shoulder flex with 80% accuracy.   Period Weeks   Status New     OT LONG TERM GOAL  #9   Baseline Pt will be able to manipulate 1 inch object in R hand with at least 75% accuracy.   Status New               Plan - 10/01/16 1704    Clinical Impression Statement Pt demo good ability to use rocker knife for simulated cutting in therapy.  Pt also demo decr spasticity in R hand/wrist and reports no problems with updated splint.   Rehab Potential Good   OT Frequency 2x / week   OT  Duration --  9 weeks   OT Treatment/Interventions Self-care/ADL training;Therapeutic exercise;DME and/or AE instruction;Building services engineer;Therapeutic activities;Patient/family education;Balance training;Neuromuscular education;Electrical Stimulation;Moist Heat;Energy conservation;Passive range of motion;Therapeutic exercises;Splinting;Manual Therapy;Fluidtherapy;Ultrasound;Cryotherapy;Parrafin   Plan RUE functional use, grasp/release, balance with grasp/release   OT Home Exercise Plan  Education provided:  06/05/15 Initial HEP (table slides with light wt. bearing, tracing, and flipping cards); 06/22/16 supine ball ex and thumb opposition; 09/22/16  updates to HEP   Consulted and Agree with Plan of Care Patient   Family Member Consulted husband      Patient will benefit from skilled therapeutic intervention in order to improve the following deficits and impairments:  Decreased coordination, Decreased activity tolerance, Decreased knowledge of use of DME, Decreased strength, Impaired UE functional use, Impaired tone, Decreased range of motion, Decreased balance, Decreased mobility, Difficulty walking  Visit Diagnosis: Hemiplegia and hemiparesis following cerebral infarction affecting right dominant side (HCC)  Muscle weakness (generalized)  Other abnormalities of gait and mobility  Unsteadiness on feet  Other lack of coordination  Other symptoms and signs involving the nervous system    Problem List Patient Active Problem List   Diagnosis Date Noted  . Slow transit constipation   . Acute blood loss anemia   . Adjustment disorder with depressed mood   . Cerebrovascular accident (CVA) ([place]) 02/03/2016  . Dysarthria, post-stroke   . Dysphagia, post-stroke   . Leukocytosis   . Prediabetes   . Right hemiparesis ([place])   . Cerebral infarction due to unspecified mechanism   . Other secondary hypertension   . Overactive bladder   . Diastolic dysfunction   . Hyperlipidemia   .  Essential hypertension   . Essential tremor   . Stroke (cerebrum) ([place]) 02/01/2016  . Facial droop due to stroke 02/01/2016  . Essential and other specified forms of tremor 02/20/2013  . Dysphonia 02/20/2013  . Trigeminal neuralgia 02/20/2013    [facility] 10/01/2016, 5:09 PM  [place] [address] [place], [place], [address] Phone: [phone]   Fax:  [phone]  Name: Kelly [name] MRN: [id number] Date of Birth: Nov 09, 1927   Vianne Bulls, OTR/L [facility] [address] [place], [place]  [address] [phone] phone [phone] 10/01/16 5:13 PM

## 2016-10-06 ENCOUNTER — Ambulatory Visit: Payer: Medicare Other | Admitting: Physical Therapy

## 2016-10-06 ENCOUNTER — Ambulatory Visit: Payer: Medicare Other | Admitting: Occupational Therapy

## 2016-10-06 ENCOUNTER — Encounter: Payer: Self-pay | Admitting: Physical Therapy

## 2016-10-06 DIAGNOSIS — M6281 Muscle weakness (generalized): Secondary | ICD-10-CM | POA: Diagnosis not present

## 2016-10-06 DIAGNOSIS — R2681 Unsteadiness on feet: Secondary | ICD-10-CM

## 2016-10-06 DIAGNOSIS — R278 Other lack of coordination: Secondary | ICD-10-CM | POA: Diagnosis not present

## 2016-10-06 DIAGNOSIS — I69351 Hemiplegia and hemiparesis following cerebral infarction affecting right dominant side: Secondary | ICD-10-CM

## 2016-10-06 DIAGNOSIS — R2689 Other abnormalities of gait and mobility: Secondary | ICD-10-CM

## 2016-10-06 DIAGNOSIS — R29818 Other symptoms and signs involving the nervous system: Secondary | ICD-10-CM | POA: Diagnosis not present

## 2016-10-06 NOTE — Therapy (Signed)
[place] [address] [place], [place], [address] Phone: [phone]   Fax:  [phone]  Occupational Therapy Treatment  Patient Details  Name: Kelly [name] MRN: [id number] Date of Birth: [birth year] Referring Provider: Cecille Rubin, NP  Encounter Date: 10/06/2016      OT End of Session - 10/06/16 1402    Visit Number 32   Number of Visits 46  30+16=46   Date for OT Re-Evaluation 11/27/16   Authorization Type Medicare / [employer] (g-code needed)   Authorization Time Period renewal completed 07/30/16; renewal completed 09/28/16-11/27/16   Authorization - Visit Number 37   Authorization - Number of Visits 40   OT Start Time 1403   OT Stop Time 1445   OT Time Calculation (min) 42 min   Activity Tolerance Patient tolerated treatment well   Behavior During Therapy [facility] for tasks assessed/performed      Past Medical History:  Diagnosis Date  . Acute blood loss anemia   . Adjustment disorder with depressed mood   . CVA (cerebral vascular accident) ([place]) 02/03/2016   Linear infarct within the posterior limb of left internal capsule  . Diastolic CHF ([place])   . Dysphagia   . Dysphonia 02/20/2013  . Essential and other specified forms of tremor 02/20/2013  . HLD (hyperlipidemia)   . HTN (hypertension)   . OAB (overactive bladder)   . Osteoporosis   . Patient receiving subcutaneous heparin    For DVT prophylaxis 9/17  . Prediabetes   . Psoriasis   . Slow transit constipation   . Stroke (cerebrum) ([place]) 02/01/2016  . Trigeminal neuralgia 02/20/2013    Past Surgical History:  Procedure Laterality Date  . APPENDECTOMY  1940  . bladder tack    . CATARACT EXTRACTION Bilateral   . HEMORRHOID SURGERY  1960  . HUMERUS FRACTURE SURGERY    . LAPAROSCOPIC HYSTERECTOMY  2006  . torn rotator cuff    . WRIST FRACTURE SURGERY  2005   seconday shoulder 2001    There were no vitals filed for this visit.      Subjective  Assessment - 10/06/16 1401    Subjective  Writing was better at home, able to use public restroom at [employer] for the first time.   Pertinent History adjustment disorder with depressed mood, diastolic CHF, osteoporosis, hx of fall, essential tremor, HTN, prediabetes, trigeminal neuralgia   Limitations fall risk   Patient Stated Goals be able to write, fold clothes, do dishes   Currently in Pain? No/denies       In supine, AAROM BUEs with ball for shoulder flex, chest press, diagonals, tricep ext with min cues/facilitation.  In sitting, tossing small ball between hands and then to therapist with RUE with mod difficulty (improved release today, but unable to catch with RUE) then tossing/catching medium ball with BUEs with min difficulty.  Wt. Bearing through RUE on mat with body on arm movements with min cues/facilitation for decr tone, incr tricep activation.  Mid-high range functional reaching with RUE to place/remove small ball on overhead shelf (in sitting) with min cues.  Mid range functional reaching to grasp/release cylinder objects with min cues/difficulty and intermittent min facilitation.  Mid range functional reaching to grasp/release 1-inch blocks with focus/min cues for tip pinch.    Stacking 1-inch blocks for incr coordination/RUE control with mod difficulty, particularly with shoulder control/fatigue.  PROM to R wrist/hand for extension.  OT Short Term Goals - 09/28/16 1941      OT SHORT TERM GOAL #1   Title -----------------     OT SHORT TERM GOAL #2   Title ----------------     OT SHORT TERM GOAL #3   Title --------------     OT SHORT TERM GOAL #4   Title -----------------------     OT SHORT TERM GOAL #5   Title Pt will be able to cut food mod I.--check updated STGs 10/28/16   Status On-going  08/24/16:  improved and is performing inconsistently;  09/28/16  continues to have difficulty     OT SHORT TERM GOAL #6   Title Pt will  be able to write 2-3 sentences with 100% legibility.   Status On-going  08/25/16  approx 75% legibility today.  09/24/16  approx 85% legibility     OT SHORT TERM GOAL #7   Title Pt will be able to reach to retrieve cylinder objects with RUE from at least 75* shoulder flex with 80% accuracy.   Status New           OT Long Term Goals - 10/01/16 1706      OT LONG TERM GOAL #1   Title --------------------------------   Time --   Period --     OT LONG TERM GOAL #2   Title ---------------------------     OT LONG TERM GOAL #3   Title ----------------------------------------   Period --     OT LONG TERM GOAL #4   Title Pt will improve functional reaching and grasp/release as shown by improving score on box and blocks to at least 25.--check updated LTGs 11/27/16  Revised 07/09/16:  as pt met initial goal of improving by 10   Baseline 3 blocks   Period Weeks   Status On-going  07/09/16  Met, and revised goal;  07/30/16  17 blocks.  09/24/16  21 blocks     OT LONG TERM GOAL #5   Title -------------------------------     OT LONG TERM GOAL #6   Time --     OT LONG TERM GOAL #7   Title Pt will demo improved coordination for ADLs as shown by completing 9-hole peg test in 36mn or less.   Period Weeks   Status On-going  08/25/16:  placed 4 pegs in within 261m, completed in 73m34m37.79sec.  09/24/16:   2mi2m1.97sec     OT LONG TERM GOAL #8   Title Pt will be able to reach to retrieve cylinder objects with RUE from at least 85* shoulder flex with 80% accuracy.   Period Weeks   Status New     OT LONG TERM GOAL  #9   Baseline Pt will be able to manipulate 1 inch object in R hand with at least 75% accuracy.   Status New               Plan - 10/06/16 1402    Clinical Impression Statement Pt continues to make slow progress with RUE functional use.  Pt reports that she was able to use public restroom for the first time as well.   Rehab Potential Good   OT Frequency 2x / week   OT Duration  --  9 weeks   OT Treatment/Interventions Self-care/ADL training;Therapeutic exercise;DME and/or AE instruction;FuncTherapist, nutritionalrapeutic activities;Patient/family education;Balance training;Neuromuscular education;Electrical Stimulation;Moist Heat;Energy conservation;Passive range of motion;Therapeutic exercises;Splinting;Manual Therapy;Fluidtherapy;Ultrasound;Cryotherapy;Parrafin   Plan RUE functional use, grasp/release, balance with grasp release, high range reaching   OT Home  Exercise Plan Education provided:  06/05/15 Initial HEP (table slides with light wt. bearing, tracing, and flipping cards); 06/22/16 supine ball ex and thumb opposition; 09/22/16  updates to HEP   Consulted and Agree with Plan of Care Patient   Family Member Consulted husband      Patient will benefit from skilled therapeutic intervention in order to improve the following deficits and impairments:  Decreased coordination, Decreased activity tolerance, Decreased knowledge of use of DME, Decreased strength, Impaired UE functional use, Impaired tone, Decreased range of motion, Decreased balance, Decreased mobility, Difficulty walking  Visit Diagnosis: Hemiplegia and hemiparesis following cerebral infarction affecting right dominant side (HCC)  Muscle weakness (generalized)  Other abnormalities of gait and mobility  Unsteadiness on feet  Other lack of coordination  Other symptoms and signs involving the nervous system    Problem List Patient Active Problem List   Diagnosis Date Noted  . Slow transit constipation   . Acute blood loss anemia   . Adjustment disorder with depressed mood   . Cerebrovascular accident (CVA) ([place]) 02/03/2016  . Dysarthria, post-stroke   . Dysphagia, post-stroke   . Leukocytosis   . Prediabetes   . Right hemiparesis ([place])   . Cerebral infarction due to unspecified mechanism   . Other secondary hypertension   . Overactive bladder   . Diastolic dysfunction   .  Hyperlipidemia   . Essential hypertension   . Essential tremor   . Stroke (cerebrum) ([place]) 02/01/2016  . Facial droop due to stroke 02/01/2016  . Essential and other specified forms of tremor 02/20/2013  . Dysphonia 02/20/2013  . Trigeminal neuralgia 02/20/2013    [facility] 10/06/2016, 3:20 PM  [place] [address] [place], [place], [address] Phone: [phone]   Fax:  [phone]  Name: Kelly [name] MRN: [id number] Date of Birth: 04/13/1928   Vianne Bulls, OTR/L [facility] [address] [place], [place]  [address] [phone] phone [phone] 10/06/16 7:17 PM

## 2016-10-07 NOTE — Therapy (Signed)
[facility] [address] [place], [place], [address] Phone: [phone]   Fax:  [phone]  Physical Therapy Treatment  Patient Details  Name: Kelly [name] MRN: [id number] Date of Birth: 1927-12-05 Referring Provider: Cecille Rubin  Encounter Date: 10/06/2016      PT End of Session - 10/06/16 1323    Visit Number 31  G4 5/3 recert)   Number of Visits 52  recert 5/3 for 8 wks   Date for PT Re-Evaluation 11/20/16   Authorization Type Medicare/[employer] every 10th visit   Authorization Time Period 07/30/16- 09/25/16;   09/24/16 to 11/20/16   PT Start Time 1318   PT Stop Time 1400   PT Time Calculation (min) 42 min   Equipment Utilized During Treatment Gait belt   Activity Tolerance Patient tolerated treatment well   Behavior During Therapy [facility] for tasks assessed/performed      Past Medical History:  Diagnosis Date  . Acute blood loss anemia   . Adjustment disorder with depressed mood   . CVA (cerebral vascular accident) ([place]) 02/03/2016   Linear infarct within the posterior limb of left internal capsule  . Diastolic CHF ([place])   . Dysphagia   . Dysphonia 02/20/2013  . Essential and other specified forms of tremor 02/20/2013  . HLD (hyperlipidemia)   . HTN (hypertension)   . OAB (overactive bladder)   . Osteoporosis   . Patient receiving subcutaneous heparin    For DVT prophylaxis 9/17  . Prediabetes   . Psoriasis   . Slow transit constipation   . Stroke (cerebrum) ([place]) 02/01/2016  . Trigeminal neuralgia 02/20/2013    Past Surgical History:  Procedure Laterality Date  . APPENDECTOMY  1940  . bladder tack    . CATARACT EXTRACTION Bilateral   . HEMORRHOID SURGERY  1960  . HUMERUS FRACTURE SURGERY    . LAPAROSCOPIC HYSTERECTOMY  2006  . torn rotator cuff    . WRIST FRACTURE SURGERY  2005   seconday shoulder 2001    There were no vitals filed for this visit.      Subjective Assessment -  10/06/16 1322    Subjective Reports she is tired today as she has been on the move since 10am this morning. Had her hearing checked- the md removed some ear wax and had her hearing aides adjusted. No falls or pain to report.      Patient is accompained by: Family member   Patient Stated Goals Pt's goal for therapy is to walk again.   Currently in Pain? No/denies   Pain Score 0-No pain           [facility] Adult PT Treatment/Exercise - 10/06/16 1331      Neuro Re-ed    Neuro Re-ed Details  right leg stance with single UE support (left UE): left stepping forward/backward over 1/2 foam roller x 10 reps with     Exercises   Other Exercises  in hooklying: chin tucks with 5 sec holds x 15 reps, mod verbal/tactile cues on correct form/technique     Knee/Hip Exercises: Standing   Forward Step Up Right;2 sets;10 reps;Hand Hold: 2;Step Height: 6";Limitations   Forward Step Up Limitations using bottom step with light UE support on rails, cues for right knee control with lifting up   Other Standing Knee Exercises standing at bottom of stairs with light UE support on rails: foot taps up/down bottom 2 strairs with bil legs     Knee/Hip Exercises: Supine  [employer] AROM;Strengthening;Right;1 set;10 reps;Limitations   Technical brewer Limitations with 3# ankle weight, 5 sec holds each rep   Straight Leg Raises AROM;Strengthening;Right;1 set;10 reps;Limitations   Straight Leg Raises Limitations 2# ankle weight, cues on form and technique   Straight Leg Raise with External Rotation AROM;Strengthening;AAROM;Right;1 set;10 reps;Limitations   Straight Leg Raise with External Rotation Limitations assist/cues needed to maintain ER with each rep.            PT Short Term Goals - 09/25/16 1508      PT SHORT TERM GOAL #1   Title Pt will perform HEP with family's supervision, to address balance, gait, transfers and strengthening.  TARGET 07/01/16 (NEW TARGET DATE FOR ALL CURRENT STGs 08/30/16)    Baseline 06/29/16: met per pt and spouse report   Status Achieved     PT SHORT TERM GOAL #2   Title Pt will improve Berg Balance score to at least 26/56 for decreased fall risk.   Baseline 06/29/16: 30/56 scored today   Status Achieved     PT SHORT TERM GOAL #3   Title Pt will improve TUG score to less than or equal to 50 seconds for decreased fall risk.   Baseline 06/29/16: 23.81 with RW   Status Achieved     PT SHORT TERM GOAL #4   Title Pt will transfer sit<>stand, modified independently, at least 4 of 5 trials, to demo improved lower extremity strength and transfer efficiency.   Baseline 06/29/16: pt at supervision level with cues for safety/sequencing needed at times   Status Not Met     PT SHORT TERM GOAL #5   Title Pt will ambulate at least 200 ft using RW, supervision, for improved gait safety and efficiency.   Baseline 06/29/16: met today with RW   Status Achieved     Additional Short Term Goals   Additional Short Term Goals Yes     PT SHORT TERM GOAL #6   Title Pt/family will verbalize understanding of CVA education.   Time 4   Period Weeks   Status Achieved     PT SHORT TERM GOAL #7   Title ----------------------------------------------------------------------------------------------------     PT SHORT TERM GOAL #8   Title Patient will demonstrate sit to stand 8 of 10 times with correct sequencing for safety with supervision but no cues. (TARGET for STGs 10-23-16)   Time 4   Period Weeks   Status New     PT SHORT TERM GOAL #9   TITLE Patient will ascend/descend curb with RW with minguard assist (no physical assist, but close proximity) without verbal cues.    Time 4   Period Weeks   Status New           PT Long Term Goals - 09/24/16 1451      PT LONG TERM GOAL #1   Title Pt/family will verbalize understanding of fall prevention in the home environment.  TARGET 07/31/16 (NEW TARGET DATE FOR ALL CURRENT LTGS 09/25/16)  5/4 MET   Time 8   Period Weeks   Status  Achieved     PT LONG TERM GOAL #2   Title Pt will improve Berg Balance score to at least 31/56 for decreased fall risk.  09/24/16   32/56   Time 8   Period Weeks   Status Achieved     PT LONG TERM GOAL #3   Title Pt will improve TUG score to less than or equal to 45 seconds  for decreased fall risk.     Baseline 33.41 sec 07/28/16  5/3 23.21 sec   Time 8   Period Weeks   Status Achieved     PT LONG TERM GOAL #4   Title Pt will improve gait velocity to at least 1.8 ft/sec for improved gait efficiency and safety.   Baseline 1.56 ft/sec with RW 07/28/16;  09/24/16  2.23 ft/sec   Time 8   Period Weeks   Status Achieved     PT LONG TERM GOAL #5   Title Pt will ambulate at least 800 ft indoor and outdoor surfaces, using RW, modified independently, for improved outdoor and community. (NEW TARGET date 11-20-16)   Baseline 5/2 did 600 ft   Time 8   Period Weeks   Status On-going     Additional Long Term Goals   Additional Long Term Goals Yes     PT LONG TERM GOAL #6   Title Pt will improve Neuro QOL score on FOTO by at least 20% for improved functional mobility. (NEW TARGET date 11-20-16)   Time 8   Period Weeks   Status On-going     PT LONG TERM GOAL #7   Title Patient will increase Berg score to 39/56 to indicate lesser fall risk. (TARGET date 11-20-16)   Time 8   Period Weeks   Status New     PT LONG TERM GOAL #8   Title Patient will increase her gait velocity to >=2.45 ft/sec indicative of increased safety with community ambulation. (TARGET date 11-20-16)   Time 8   Period Weeks   Status New            Plan - 10/06/16 1324    Clinical Impression Statement Today's skilled session focused on right LE strengthening without any issues reported. Pt is making steady progress toward goals and should benefit from continued PT to progress toward unmet goals.    Rehab Potential Good   PT Frequency 2x / week   PT Duration 8 weeks   PT Treatment/Interventions ADLs/Self Care Home  Management;Functional mobility training;Stair training;Gait training;Patient/family education;Neuromuscular re-education;Balance training;Therapeutic exercise;Therapeutic activities;DME Instruction   PT Next Visit Plan try to progress to sitting/standing with chin tucks with good form; standing balance with R knee control ; gait distance with ramps/curbs included   Consulted and Agree with Plan of Care Patient;Family member/caregiver   Family Member Consulted Husband      Patient will benefit from skilled therapeutic intervention in order to improve the following deficits and impairments:  Abnormal gait, Decreased balance, Decreased cognition, Decreased knowledge of use of DME, Decreased range of motion, Decreased safety awareness, Decreased strength  Visit Diagnosis: Hemiplegia and hemiparesis following cerebral infarction affecting right dominant side (HCC)  Muscle weakness (generalized)  Other abnormalities of gait and mobility  Unsteadiness on feet     Problem List Patient Active Problem List   Diagnosis Date Noted  . Slow transit constipation   . Acute blood loss anemia   . Adjustment disorder with depressed mood   . Cerebrovascular accident (CVA) ([place]) 02/03/2016  . Dysarthria, post-stroke   . Dysphagia, post-stroke   . Leukocytosis   . Prediabetes   . Right hemiparesis ([place])   . Cerebral infarction due to unspecified mechanism   . Other secondary hypertension   . Overactive bladder   . Diastolic dysfunction   . Hyperlipidemia   . Essential hypertension   . Essential tremor   . Stroke (cerebrum) ([place]) 02/01/2016  . Facial droop  due to stroke 02/01/2016  . Essential and other specified forms of tremor 02/20/2013  . Dysphonia 02/20/2013  . Trigeminal neuralgia 02/20/2013    Willow Ora, PTA, [facility] [address] [place], [place] [address] [phone] 10/07/16, 5:42 PM   Name: Kelly [name] MRN: [id number] Date of Birth:  10/04/1927

## 2016-10-08 ENCOUNTER — Encounter: Payer: Self-pay | Admitting: Physical Therapy

## 2016-10-08 ENCOUNTER — Ambulatory Visit: Payer: Medicare Other | Admitting: Physical Therapy

## 2016-10-08 ENCOUNTER — Ambulatory Visit: Payer: Medicare Other | Admitting: Occupational Therapy

## 2016-10-08 DIAGNOSIS — R2681 Unsteadiness on feet: Secondary | ICD-10-CM

## 2016-10-08 DIAGNOSIS — I69351 Hemiplegia and hemiparesis following cerebral infarction affecting right dominant side: Secondary | ICD-10-CM | POA: Diagnosis not present

## 2016-10-08 DIAGNOSIS — R2689 Other abnormalities of gait and mobility: Secondary | ICD-10-CM

## 2016-10-08 DIAGNOSIS — R29818 Other symptoms and signs involving the nervous system: Secondary | ICD-10-CM | POA: Diagnosis not present

## 2016-10-08 DIAGNOSIS — M6281 Muscle weakness (generalized): Secondary | ICD-10-CM | POA: Diagnosis not present

## 2016-10-08 DIAGNOSIS — R278 Other lack of coordination: Secondary | ICD-10-CM | POA: Diagnosis not present

## 2016-10-08 NOTE — Therapy (Signed)
[facility] [address] [place], [place], [address] Phone: [phone]   Fax:  [phone]  Physical Therapy Treatment  Patient Details  Name: Kelly [name] MRN: [id number] Date of Birth: Oct 28, 1927 Referring Provider: Cecille Rubin  Encounter Date: 10/08/2016      PT End of Session - 10/08/16 1155    Visit Number 32  G5 5/3 recert)   Number of Visits 52  recert 5/3 for 8 wks   Date for PT Re-Evaluation 11/20/16   Authorization Type Medicare/[employer]-G[employer] every 10th visit   Authorization Time Period 07/30/16- 09/25/16;   09/24/16 to 11/20/16   PT Start Time 1107   PT Stop Time 1148   PT Time Calculation (min) 41 min   Activity Tolerance Patient tolerated treatment well   Behavior During Therapy [facility] for tasks assessed/performed      Past Medical History:  Diagnosis Date  . Acute blood loss anemia   . Adjustment disorder with depressed mood   . CVA (cerebral vascular accident) ([place]) 02/03/2016   Linear infarct within the posterior limb of left internal capsule  . Diastolic CHF ([place])   . Dysphagia   . Dysphonia 02/20/2013  . Essential and other specified forms of tremor 02/20/2013  . HLD (hyperlipidemia)   . HTN (hypertension)   . OAB (overactive bladder)   . Osteoporosis   . Patient receiving subcutaneous heparin    For DVT prophylaxis 9/17  . Prediabetes   . Psoriasis   . Slow transit constipation   . Stroke (cerebrum) ([place]) 02/01/2016  . Trigeminal neuralgia 02/20/2013    Past Surgical History:  Procedure Laterality Date  . APPENDECTOMY  1940  . bladder tack    . CATARACT EXTRACTION Bilateral   . HEMORRHOID SURGERY  1960  . HUMERUS FRACTURE SURGERY    . LAPAROSCOPIC HYSTERECTOMY  2006  . torn rotator cuff    . WRIST FRACTURE SURGERY  2005   seconday shoulder 2001    There were no vitals filed for this visit.                       [facility] Adult PT Treatment/Exercise - 10/08/16  0001      Transfers   Transfers Sit to Stand;Stand to Sit   Sit to Stand 5: Supervision;With upper extremity assist;From bed;From chair/3-in-1   Sit to Stand Details (indicate cue type and reason) vc to not use UEs to descend slowly   Stand to Sit 5: Supervision;Without upper extremity assist   Number of Reps 10 reps;1 set     Ambulation/Gait   Ambulation/Gait Assistance 5: Supervision   Ambulation/Gait Assistance Details vc, tactile cues for upright posture and looking forward   Ambulation Distance (Feet) 30 Feet  60, 60   Assistive device Rolling walker   Gait Pattern Step-through pattern;Decreased stride length;Decreased dorsiflexion - right;Right genu recurvatum;Poor foot clearance - right   Ambulation Surface Level;Indoor   Ramp 4: Min assist   Ramp Details (indicate cue type and reason) difficulty advancing RLE up ramp (toe catching) and required assist to maintain blalanc   Curb 4: Min assist   Curb Details (indicate cue type and reason) stepping down 6" curb with RW; pt able to place RW down with very light min assist   Pre-Gait Activities at counter LUE supported, rt step, fwd weight-shift onto RLE with knee control, and shift back onto LLE x 10 reps; intermittent LUE/HHA     Knee/Hip Exercises: Aerobic  Nustep L2 x 2min; L3 x 3 min     Knee/Hip Exercises: Standing   Terminal Knee Extension Strengthening;Right;1 set;20 reps  Lt foot on yoga block; "mini-squat" with rt knee control   Hip Abduction Stengthening;Both;1 set;15 reps;Knee straight   Forward Step Up Right;1 set;10 reps;Step Height: 2";Hand Hold: 2  onto black beam   Step Down Right;1 set;10 reps;Hand Hold: 2;Step Height: 4"             Balance Exercises - 10/08/16 1151      Balance Exercises: Standing   Tandem Gait Forward;Upper extremity support;2 reps  // bars             PT Short Term Goals - 09/25/16 1508      PT SHORT TERM GOAL #1   Title Pt will perform HEP with family's supervision,  to address balance, gait, transfers and strengthening.  TARGET 07/01/16 (NEW TARGET DATE FOR ALL CURRENT STGs 08/30/16)   Baseline 06/29/16: met per pt and spouse report   Status Achieved     PT SHORT TERM GOAL #2   Title Pt will improve Berg Balance score to at least 26/56 for decreased fall risk.   Baseline 06/29/16: 30/56 scored today   Status Achieved     PT SHORT TERM GOAL #3   Title Pt will improve TUG score to less than or equal to 50 seconds for decreased fall risk.   Baseline 06/29/16: 23.81 with RW   Status Achieved     PT SHORT TERM GOAL #4   Title Pt will transfer sit<>stand, modified independently, at least 4 of 5 trials, to demo improved lower extremity strength and transfer efficiency.   Baseline 06/29/16: pt at supervision level with cues for safety/sequencing needed at times   Status Not Met     PT SHORT TERM GOAL #5   Title Pt will ambulate at least 200 ft using RW, supervision, for improved gait safety and efficiency.   Baseline 06/29/16: met today with RW   Status Achieved     Additional Short Term Goals   Additional Short Term Goals Yes     PT SHORT TERM GOAL #6   Title Pt/family will verbalize understanding of CVA education.   Time 4   Period Weeks   Status Achieved     PT SHORT TERM GOAL #7   Title ----------------------------------------------------------------------------------------------------     PT SHORT TERM GOAL #8   Title Patient will demonstrate sit to stand 8 of 10 times with correct sequencing for safety with supervision but no cues. (TARGET for STGs 10-23-16)   Time 4   Period Weeks   Status New     PT SHORT TERM GOAL #9   TITLE Patient will ascend/descend curb with RW with minguard assist (no physical assist, but close proximity) without verbal cues.    Time 4   Period Weeks   Status New           PT Long Term Goals - 09/24/16 1451      PT LONG TERM GOAL #1   Title Pt/family will verbalize understanding of fall prevention in the home  environment.  TARGET 07/31/16 (NEW TARGET DATE FOR ALL CURRENT LTGS 09/25/16)  5/4 MET   Time 8   Period Weeks   Status Achieved     PT LONG TERM GOAL #2   Title Pt will improve Berg Balance score to at least 31/56 for decreased fall risk.  09/24/16   32/56   Time 8     Period Weeks   Status Achieved     PT LONG TERM GOAL #3   Title Pt will improve TUG score to less than or equal to 45 seconds for decreased fall risk.     Baseline 33.41 sec 07/28/16  5/3 23.21 sec   Time 8   Period Weeks   Status Achieved     PT LONG TERM GOAL #4   Title Pt will improve gait velocity to at least 1.8 ft/sec for improved gait efficiency and safety.   Baseline 1.56 ft/sec with RW 07/28/16;  09/24/16  2.23 ft/sec   Time 8   Period Weeks   Status Achieved     PT LONG TERM GOAL #5   Title Pt will ambulate at least 800 ft indoor and outdoor surfaces, using RW, modified independently, for improved outdoor and community. (NEW TARGET date 11-20-16)   Baseline 5/2 did 600 ft   Time 8   Period Weeks   Status On-going     Additional Long Term Goals   Additional Long Term Goals Yes     PT LONG TERM GOAL #6   Title Pt will improve Neuro QOL score on FOTO by at least 20% for improved functional mobility. (NEW TARGET date 11-20-16)   Time 8   Period Weeks   Status On-going     PT LONG TERM GOAL #7   Title Patient will increase Berg score to 39/56 to indicate lesser fall risk. (TARGET date 11-20-16)   Time 8   Period Weeks   Status New     PT LONG TERM GOAL #8   Title Patient will increase her gait velocity to >=2.45 ft/sec indicative of increased safety with community ambulation. (TARGET date 11-20-16)   Time 8   Period Weeks   Status New               Plan - 10/08/16 1157    Clinical Impression Statement Session focused on LE strengthening with focus on rt knee control (not moving into genu recurvatum) and pre-gait leading to gait training (also to facilitate knee control). Patient required only one  seated rest break today as her endurance is improving. Patient continues to benefit from PT working towards updated goals.   Rehab Potential Good   PT Frequency 2x / week   PT Duration 8 weeks   PT Treatment/Interventions ADLs/Self Care Home Management;Functional mobility training;Stair training;Gait training;Patient/family education;Neuromuscular re-education;Balance training;Therapeutic exercise;Therapeutic activities;DME Instruction   PT Next Visit Plan try to progress to sitting/standing with chin tucks with good form; standing balance with R knee control ; gait distance with ramps/curbs included   Consulted and Agree with Plan of Care Patient;Family member/caregiver   Family Member Consulted Husband      Patient will benefit from skilled therapeutic intervention in order to improve the following deficits and impairments:  Abnormal gait, Decreased balance, Decreased cognition, Decreased knowledge of use of DME, Decreased range of motion, Decreased safety awareness, Decreased strength  Visit Diagnosis: Muscle weakness (generalized)  Other abnormalities of gait and mobility  Unsteadiness on feet     Problem List Patient Active Problem List   Diagnosis Date Noted  . Slow transit constipation   . Acute blood loss anemia   . Adjustment disorder with depressed mood   . Cerebrovascular accident (CVA) ([place]) 02/03/2016  . Dysarthria, post-stroke   . Dysphagia, post-stroke   . Leukocytosis   . Prediabetes   . Right hemiparesis ([place])   . Cerebral infarction due to unspecified mechanism   .  Other secondary hypertension   . Overactive bladder   . Diastolic dysfunction   . Hyperlipidemia   . Essential hypertension   . Essential tremor   . Stroke (cerebrum) ([facility]) 02/01/2016  . Facial droop due to stroke 02/01/2016  . Essential and other specified forms of tremor 02/20/2013  . Dysphonia 02/20/2013  . Trigeminal neuralgia 02/20/2013    Lynn P Sasser, PT 10/08/2016, 12:03  PM  [facility] [address] [place], [place], [address] Phone: [phone]   Fax:  [phone]  Name: Kelly [name] MRN: [id number] Date of Birth: 01/12/1928   

## 2016-10-08 NOTE — Therapy (Signed)
[facility] [address] [place], [place], [address] Phone: [phone]   Fax:  [phone]  Occupational Therapy Treatment  Patient Details  Name: Kelly [name] MRN: [id number] Date of Birth: 03/31/1928 Referring Provider: Carolyn Martin, NP  Encounter Date: 10/08/2016      OT End of Session - 10/08/16 1400    Visit Number 33   Number of Visits 46  30+16=46   Date for OT Re-Evaluation 11/27/16   Authorization Type Medicare / [employer] (g-code needed)   Authorization Time Period renewal completed 07/30/16; renewal completed 09/28/16-11/27/16   Authorization - Visit Number 33   Authorization - Number of Visits 40   OT Start Time 1403   OT Stop Time 1445   OT Time Calculation (min) 42 min   Activity Tolerance Patient tolerated treatment well   Behavior During Therapy WFL for tasks assessed/performed      Past Medical History:  Diagnosis Date  . Acute blood loss anemia   . Adjustment disorder with depressed mood   . CVA (cerebral vascular accident) (HCC) 02/03/2016   Linear infarct within the posterior limb of left internal capsule  . Diastolic CHF (HCC)   . Dysphagia   . Dysphonia 02/20/2013  . Essential and other specified forms of tremor 02/20/2013  . HLD (hyperlipidemia)   . HTN (hypertension)   . OAB (overactive bladder)   . Osteoporosis   . Patient receiving subcutaneous heparin    For DVT prophylaxis 9/17  . Prediabetes   . Psoriasis   . Slow transit constipation   . Stroke (cerebrum) (HCC) 02/01/2016  . Trigeminal neuralgia 02/20/2013    Past Surgical History:  Procedure Laterality Date  . APPENDECTOMY  1940  . bladder tack    . CATARACT EXTRACTION Bilateral   . HEMORRHOID SURGERY  1960  . HUMERUS FRACTURE SURGERY    . LAPAROSCOPIC HYSTERECTOMY  2006  . torn rotator cuff    . WRIST FRACTURE SURGERY  2005   seconday shoulder 2001    There were no vitals filed for this visit.      Subjective  Assessment - 10/08/16 1400    Subjective  Pt reports that she wrote her name really good one time, but the more she wrote, the worse it got.   Pertinent History adjustment disorder with depressed mood, diastolic CHF, osteoporosis, hx of fall, essential tremor, HTN, prediabetes, trigeminal neuralgia   Limitations fall risk   Patient Stated Goals be able to write, fold clothes, do dishes   Currently in Pain? No/denies       In supine, shoulder flex self PROM with cane followed by cane ex for shoulder abduction with min cues.  Then tricep ext with 1lb wt with min facilitation.   Shoulder flex and chest press with 2lb wt. With min cues/facilitation for R elbow ext.  Sitting, wt. Bearing through R hand on mat with body on arm movements and min-mod facilitation/cues for incr tricep ext and decr tone.  Then facilitated lateral reach with RUE with min-mod facilitation for full ext of elbow, wrist/fingers.  Mid-range functional open-chain to grasp/release small cylinder objects.  In standing, working on high level functional reaching to place/remove hangers on rack with min facilitation for timing of grasp/release.  In sitting, high level reaching with focus on normal movement patterns including extending elbow and wrist/hand for incr ease for grasp/release.  Progressed to releasing scarves with mod cueing for incr finger ext.                      OT Education - 10/08/16 1544    Education Details Work on high level reaching with finger ext and timing of finger flex with grasp (keep fingers extended longer)   Person(s) Educated Patient;Spouse   Methods Explanation;Demonstration;Verbal cues   Comprehension Verbalized understanding;Returned demonstration;Verbal cues required          OT Short Term Goals - 09/28/16 1941      OT SHORT TERM GOAL #1   Title -----------------     OT SHORT TERM GOAL #2   Title ----------------     OT SHORT TERM GOAL #3   Title --------------     OT  SHORT TERM GOAL #4   Title -----------------------     OT SHORT TERM GOAL #5   Title Pt will be able to cut food mod I.--check updated STGs 10/28/16   Status On-going  08/24/16:  improved and is performing inconsistently;  09/28/16  continues to have difficulty     OT SHORT TERM GOAL #6   Title Pt will be able to write 2-3 sentences with 100% legibility.   Status On-going  08/25/16  approx 75% legibility today.  09/24/16  approx 85% legibility     OT SHORT TERM GOAL #7   Title Pt will be able to reach to retrieve cylinder objects with RUE from at least 75* shoulder flex with 80% accuracy.   Status New           OT Long Term Goals - 10/01/16 1706      OT LONG TERM GOAL #1   Title --------------------------------   Time --   Period --     OT LONG TERM GOAL #2   Title ---------------------------     OT LONG TERM GOAL #3   Title ----------------------------------------   Period --     OT LONG TERM GOAL #4   Title Pt will improve functional reaching and grasp/release as shown by improving score on box and blocks to at least 25.--check updated LTGs 11/27/16  Revised 07/09/16:  as pt met initial goal of improving by 10   Baseline 3 blocks   Period Weeks   Status On-going  07/09/16  Met, and revised goal;  07/30/16  17 blocks.  09/24/16  21 blocks     OT LONG TERM GOAL #5   Title -------------------------------     OT LONG TERM GOAL #6   Time --     OT LONG TERM GOAL #7   Title Pt will demo improved coordination for ADLs as shown by completing 9-hole peg test in 10mn or less.   Period Weeks   Status On-going  08/25/16:  placed 4 pegs in within 280m, completed in 39m37m37.79sec.  09/24/16:   2mi45m1.97sec     OT LONG TERM GOAL #8   Title Pt will be able to reach to retrieve cylinder objects with RUE from at least 85* shoulder flex with 80% accuracy.   Period Weeks   Status New     OT LONG TERM GOAL  #9   Baseline Pt will be able to manipulate 1 inch object in R hand with at least 75%  accuracy.   Status New               Plan - 10/08/16 1501    Clinical Impression Statement Pt is progressing towards goals.  However, spasticity and decr timing for grasp affects functional use of RUE.  Pt does demo improved functional reach.   Rehab Potential Good   OT  Frequency 2x / week   OT Duration --  9 weeks   OT Treatment/Interventions Self-care/ADL training;Therapeutic exercise;DME and/or AE instruction;Functional Mobility Training;Therapeutic activities;Patient/family education;Balance training;Neuromuscular education;Electrical Stimulation;Moist Heat;Energy conservation;Passive range of motion;Therapeutic exercises;Splinting;Manual Therapy;Fluidtherapy;Ultrasound;Cryotherapy;Parrafin   Plan RUE functional use, grasp/release, high range reaching and reaching in standing   OT Home Exercise Plan Education provided:  06/05/15 Initial HEP (table slides with light wt. bearing, tracing, and flipping cards); 06/22/16 supine ball ex and thumb opposition; 09/22/16  updates to HEP   Consulted and Agree with Plan of Care Patient   Family Member Consulted husband      Patient will benefit from skilled therapeutic intervention in order to improve the following deficits and impairments:  Decreased coordination, Decreased activity tolerance, Decreased knowledge of use of DME, Decreased strength, Impaired UE functional use, Impaired tone, Decreased range of motion, Decreased balance, Decreased mobility, Difficulty walking  Visit Diagnosis: Hemiplegia and hemiparesis following cerebral infarction affecting right dominant side (HCC)  Muscle weakness (generalized)  Other abnormalities of gait and mobility  Other lack of coordination  Unsteadiness on feet  Other symptoms and signs involving the nervous system    Problem List Patient Active Problem List   Diagnosis Date Noted  . Slow transit constipation   . Acute blood loss anemia   . Adjustment disorder with depressed mood   .  Cerebrovascular accident (CVA) (HCC) 02/03/2016  . Dysarthria, post-stroke   . Dysphagia, post-stroke   . Leukocytosis   . Prediabetes   . Right hemiparesis (HCC)   . Cerebral infarction due to unspecified mechanism   . Other secondary hypertension   . Overactive bladder   . Diastolic dysfunction   . Hyperlipidemia   . Essential hypertension   . Essential tremor   . Stroke (cerebrum) (HCC) 02/01/2016  . Facial droop due to stroke 02/01/2016  . Essential and other specified forms of tremor 02/20/2013  . Dysphonia 02/20/2013  . Trigeminal neuralgia 02/20/2013    FREEMAN,ANGELA 10/08/2016, 3:44 PM  [facility] [address] [place], [place], [address] Phone: [phone]   Fax:  [phone]  Name: Kelly [name] MRN: [id number] Date of Birth: 07/12/1927   Angela Freeman, OTR/L [facility] [address] [place], [place]  [address] [phone] phone [phone] 10/08/16 3:44 PM    

## 2016-10-12 ENCOUNTER — Encounter: Payer: Self-pay | Admitting: Physical Therapy

## 2016-10-12 ENCOUNTER — Ambulatory Visit: Payer: Medicare Other | Admitting: Occupational Therapy

## 2016-10-12 ENCOUNTER — Ambulatory Visit: Payer: Medicare Other | Admitting: Physical Therapy

## 2016-10-12 DIAGNOSIS — R2681 Unsteadiness on feet: Secondary | ICD-10-CM

## 2016-10-12 DIAGNOSIS — R2689 Other abnormalities of gait and mobility: Secondary | ICD-10-CM

## 2016-10-12 DIAGNOSIS — R29818 Other symptoms and signs involving the nervous system: Secondary | ICD-10-CM

## 2016-10-12 DIAGNOSIS — M6281 Muscle weakness (generalized): Secondary | ICD-10-CM

## 2016-10-12 DIAGNOSIS — I69351 Hemiplegia and hemiparesis following cerebral infarction affecting right dominant side: Secondary | ICD-10-CM

## 2016-10-12 DIAGNOSIS — R278 Other lack of coordination: Secondary | ICD-10-CM | POA: Diagnosis not present

## 2016-10-12 NOTE — Therapy (Signed)
[facility] [address] [place], [place], [address] Phone: [phone]   Fax:  [phone]  Physical Therapy Treatment  Patient Details  Name: Kelly [name] MRN: [id number] Date of Birth: Jul 25, 1927 Referring Provider: Cecille Rubin  Encounter Date: 10/12/2016      PT End of Session - 10/12/16 2139    Visit Number 33  G6 5/3 recert)   Number of Visits 52  recert 5/3 for 8 wks   Date for PT Re-Evaluation 11/20/16   Authorization Type Medicare/[employer] every 10th visit   Authorization Time Period 07/30/16- 09/25/16;   09/24/16 to 11/20/16   PT Start Time 1452   PT Stop Time 1530   PT Time Calculation (min) 38 min   Activity Tolerance Patient tolerated treatment well   Behavior During Therapy [facility] for tasks assessed/performed      Past Medical History:  Diagnosis Date  . Acute blood loss anemia   . Adjustment disorder with depressed mood   . CVA (cerebral vascular accident) ([place]) 02/03/2016   Linear infarct within the posterior limb of left internal capsule  . Diastolic CHF ([place])   . Dysphagia   . Dysphonia 02/20/2013  . Essential and other specified forms of tremor 02/20/2013  . HLD (hyperlipidemia)   . HTN (hypertension)   . OAB (overactive bladder)   . Osteoporosis   . Patient receiving subcutaneous heparin    For DVT prophylaxis 9/17  . Prediabetes   . Psoriasis   . Slow transit constipation   . Stroke (cerebrum) ([place]) 02/01/2016  . Trigeminal neuralgia 02/20/2013    Past Surgical History:  Procedure Laterality Date  . APPENDECTOMY  1940  . bladder tack    . CATARACT EXTRACTION Bilateral   . HEMORRHOID SURGERY  1960  . HUMERUS FRACTURE SURGERY    . LAPAROSCOPIC HYSTERECTOMY  2006  . torn rotator cuff    . WRIST FRACTURE SURGERY  2005   seconday shoulder 2001    There were no vitals filed for this visit.      Subjective Assessment - 10/12/16 2140    Subjective Feeling well. Did not get  to walk at the park this weekend due to rain and then too hot. No falls. States she knows she became more cautious after her last fall and has taken her awhile to feel some confidence.    Patient is accompained by: Family member   Patient Stated Goals Pt's goal for therapy is to walk again.   Currently in Pain? No/denies                         [facility] Adult PT Treatment/Exercise - 10/12/16 2143      Transfers   Transfers Sit to Stand;Stand to Sit   Sit to Stand 5: Supervision;With upper extremity assist;From bed;From chair/3-in-1   Sit to Stand Details (indicate cue type and reason) proper sequencing each time!   Stand to Sit 5: Supervision   Stand to Sit Details no cues required     Ambulation/Gait   Ambulation/Gait Assistance 4: Min guard;5: Supervision   Ambulation/Gait Assistance Details closeguarding for safety, occasionally catches Rt toe (despite toe cap)    Ambulation Distance (Feet) 120 Feet  30,30   Assistive device Rolling walker   Gait Pattern Step-through pattern;Decreased stride length;Decreased dorsiflexion - right;Right genu recurvatum;Poor foot clearance - right   Ambulation Surface Level;Indoor     Posture/Postural Control   Posture/Postural Control Postural limitations  Postural Limitations Rounded Shoulders;Forward head;Increased thoracic kyphosis   Posture Comments lying supine chin tucks (improved from previous attempt); in sitting pt with greater difficulty      Knee/Hip Exercises: Stretches   Hip Flexor Stretch 2 reps;60 seconds   Hip Flexor Stretch Limitations supine at EOB, lowering one leg off EOB     Knee/Hip Exercises: Standing   Gait Training at counter with left hand on counter, rt HHA. walkin forward 8 ft then bckwd 8 ft   Other Standing Knee Exercises facing counter staggered stance; fwd wt-shift with pelvis stretching anterior to help get full hip ext                PT Education - 10/12/16 2152    Education provided  Yes   Education Details see hip flexor stretch added to HEP; importance of hip ROM for upright posture   Person(s) Educated Patient;Spouse   Methods Explanation;Demonstration;Tactile cues;Verbal cues;Handout   Comprehension Verbalized understanding;Returned demonstration;Verbal cues required;Tactile cues required;Need further instruction          PT Short Term Goals - 09/25/16 1508      PT SHORT TERM GOAL #1   Title Pt will perform HEP with family's supervision, to address balance, gait, transfers and strengthening.  TARGET 07/01/16 (NEW TARGET DATE FOR ALL CURRENT STGs 08/30/16)   Baseline 06/29/16: met per pt and spouse report   Status Achieved     PT SHORT TERM GOAL #2   Title Pt will improve Berg Balance score to at least 26/56 for decreased fall risk.   Baseline 06/29/16: 30/56 scored today   Status Achieved     PT SHORT TERM GOAL #3   Title Pt will improve TUG score to less than or equal to 50 seconds for decreased fall risk.   Baseline 06/29/16: 23.81 with RW   Status Achieved     PT SHORT TERM GOAL #4   Title Pt will transfer sit<>stand, modified independently, at least 4 of 5 trials, to demo improved lower extremity strength and transfer efficiency.   Baseline 06/29/16: pt at supervision level with cues for safety/sequencing needed at times   Status Not Met     PT SHORT TERM GOAL #5   Title Pt will ambulate at least 200 ft using RW, supervision, for improved gait safety and efficiency.   Baseline 06/29/16: met today with RW   Status Achieved     Additional Short Term Goals   Additional Short Term Goals Yes     PT SHORT TERM GOAL #6   Title Pt/family will verbalize understanding of CVA education.   Time 4   Period Weeks   Status Achieved     PT SHORT TERM GOAL #7   Title ----------------------------------------------------------------------------------------------------     PT SHORT TERM GOAL #8   Title Patient will demonstrate sit to stand 8 of 10 times with correct  sequencing for safety with supervision but no cues. (TARGET for STGs 10-23-16)   Time 4   Period Weeks   Status New     PT SHORT TERM GOAL #9   TITLE Patient will ascend/descend curb with RW with minguard assist (no physical assist, but close proximity) without verbal cues.    Time 4   Period Weeks   Status New           PT Long Term Goals - 09/24/16 1451      PT LONG TERM GOAL #1   Title Pt/family will verbalize understanding of fall prevention in  the home environment.  TARGET 07/31/16 (NEW TARGET DATE FOR ALL CURRENT LTGS 09/25/16)  5/4 MET   Time 8   Period Weeks   Status Achieved     PT LONG TERM GOAL #2   Title Pt will improve Berg Balance score to at least 31/56 for decreased fall risk.  09/24/16   32/56   Time 8   Period Weeks   Status Achieved     PT LONG TERM GOAL #3   Title Pt will improve TUG score to less than or equal to 45 seconds for decreased fall risk.     Baseline 33.41 sec 07/28/16  5/3 23.21 sec   Time 8   Period Weeks   Status Achieved     PT LONG TERM GOAL #4   Title Pt will improve gait velocity to at least 1.8 ft/sec for improved gait efficiency and safety.   Baseline 1.56 ft/sec with RW 07/28/16;  09/24/16  2.23 ft/sec   Time 8   Period Weeks   Status Achieved     PT LONG TERM GOAL #5   Title Pt will ambulate at least 800 ft indoor and outdoor surfaces, using RW, modified independently, for improved outdoor and community. (NEW TARGET date 11-20-16)   Baseline 5/2 did 600 ft   Time 8   Period Weeks   Status On-going     Additional Long Term Goals   Additional Long Term Goals Yes     PT LONG TERM GOAL #6   Title Pt will improve Neuro QOL score on FOTO by at least 20% for improved functional mobility. (NEW TARGET date 11-20-16)   Time 8   Period Weeks   Status On-going     PT LONG TERM GOAL #7   Title Patient will increase Berg score to 39/56 to indicate lesser fall risk. (TARGET date 11-20-16)   Time 8   Period Weeks   Status New     PT LONG  TERM GOAL #8   Title Patient will increase her gait velocity to >=2.45 ft/sec indicative of increased safety with community ambulation. (TARGET date 11-20-16)   Time 8   Period Weeks   Status New               Plan - 10/12/16 2156    Clinical Impression Statement Session focused on gait training (including without a device using countertop in Lt hand and Rt HHA) with improved upright posture--compared to with use of RW. Focus on teaching hip flexor ROM/stretching for HEP.    Rehab Potential Good   PT Frequency 2x / week   PT Duration 8 weeks   PT Treatment/Interventions ADLs/Self Care Home Management;Functional mobility training;Stair training;Gait training;Patient/family education;Neuromuscular re-education;Balance training;Therapeutic exercise;Therapeutic activities;DME Instruction   PT Next Visit Plan try to progress to sitting/standing with chin tucks; gait distance with ramps/curbs included   Consulted and Agree with Plan of Care Patient;Family member/caregiver   Family Member Consulted Husband      Patient will benefit from skilled therapeutic intervention in order to improve the following deficits and impairments:  Abnormal gait, Decreased balance, Decreased cognition, Decreased knowledge of use of DME, Decreased range of motion, Decreased safety awareness, Decreased strength  Visit Diagnosis: Muscle weakness (generalized)  Other abnormalities of gait and mobility  Unsteadiness on feet     Problem List Patient Active Problem List   Diagnosis Date Noted  . Slow transit constipation   . Acute blood loss anemia   . Adjustment disorder with depressed  mood   . Cerebrovascular accident (CVA) ([place]) 02/03/2016  . Dysarthria, post-stroke   . Dysphagia, post-stroke   . Leukocytosis   . Prediabetes   . Right hemiparesis ([place])   . Cerebral infarction due to unspecified mechanism   . Other secondary hypertension   . Overactive bladder   . Diastolic dysfunction   .  Hyperlipidemia   . Essential hypertension   . Essential tremor   . Stroke (cerebrum) ([place]) 02/01/2016  . Facial droop due to stroke 02/01/2016  . Essential and other specified forms of tremor 02/20/2013  . Dysphonia 02/20/2013  . Trigeminal neuralgia 02/20/2013    Rexanne Mano, PT 10/12/2016, 10:01 PM  [facility] [address] [place], [place], [address] Phone: [phone]   Fax:  [phone]  Name: Kelly [name] MRN: [id number] Date of Birth: [birth year]

## 2016-10-12 NOTE — Patient Instructions (Signed)
HIP: Flexors - Supine    Lie on edge of surface. Place leg off the surface, allow knee to bend. Keep the other leg on the couch with the knee bent. IF you do not feel enough stretch in the front of your hip, then bend the leg that is OFF the couch.  Hold _60__ seconds. __3_ reps per set, _1__ sets per day, __7_ days per week Rest lowered foot on stool.  Copyright  VHI. All rights reserved.

## 2016-10-12 NOTE — Therapy (Signed)
[facility] [address] [place], [place], [address] Phone: [phone]   Fax:  [phone]  Occupational Therapy Treatment  Patient Details  Name: Kelly [name] MRN: [id number] Date of Birth: 1927/11/30 Referring Provider: Cecille Rubin, NP  Encounter Date: 10/12/2016      OT End of Session - 10/12/16 1406    Visit Number 34   Number of Visits 46  30+16=46   Date for OT Re-Evaluation 11/27/16   Authorization Type Medicare / [employer] (g-code needed)   Authorization Time Period renewal completed 07/30/16; renewal completed 09/28/16-11/27/16   Authorization - Visit Number 16   Authorization - Number of Visits 40   OT Start Time 1404   OT Stop Time 1445   OT Time Calculation (min) 41 min   Activity Tolerance Patient tolerated treatment well   Behavior During Therapy [facility] for tasks assessed/performed      Past Medical History:  Diagnosis Date  . Acute blood loss anemia   . Adjustment disorder with depressed mood   . CVA (cerebral vascular accident) ([place]) 02/03/2016   Linear infarct within the posterior limb of left internal capsule  . Diastolic CHF ([place])   . Dysphagia   . Dysphonia 02/20/2013  . Essential and other specified forms of tremor 02/20/2013  . HLD (hyperlipidemia)   . HTN (hypertension)   . OAB (overactive bladder)   . Osteoporosis   . Patient receiving subcutaneous heparin    For DVT prophylaxis 9/17  . Prediabetes   . Psoriasis   . Slow transit constipation   . Stroke (cerebrum) ([place]) 02/01/2016  . Trigeminal neuralgia 02/20/2013    Past Surgical History:  Procedure Laterality Date  . APPENDECTOMY  1940  . bladder tack    . CATARACT EXTRACTION Bilateral   . HEMORRHOID SURGERY  1960  . HUMERUS FRACTURE SURGERY    . LAPAROSCOPIC HYSTERECTOMY  2006  . torn rotator cuff    . WRIST FRACTURE SURGERY  2005   seconday shoulder 2001    There were no vitals filed for this visit.      Subjective  Assessment - 10/12/16 1402    Subjective  nothing new   Pertinent History adjustment disorder with depressed mood, diastolic CHF, osteoporosis, hx of fall, essential tremor, HTN, prediabetes, trigeminal neuralgia   Limitations fall risk   Patient Stated Goals be able to write, fold clothes, do dishes   Currently in Pain? No/denies      In supine, Shoulder flex and chest press and tricep ext BUEs with 2lb wt. With min cues/facilitation for R elbow ext.  Sitting, wt. Bearing through R hand on mat with body on arm movements and min-mod facilitation/cues for incr tricep ext and decr tone.   Then facilitated lateral reach with RUE with min-mod facilitation for full ext of elbow, wrist/fingers with arm supported on ball with lateral wt. shift.  Mid-range functional open-chain to grasp/release small-medium cylinder objects with min cueing and occasional min facilitation to stabilize objects.  Wt. Bearing in modified quadraped over yoga blocks with alternating UE reaching to pick up checkers and place in connect 4 slots with min facilitation.  Placing medium pegs in pegboard with min-mod difficulty, min cues for finger/wrist extension stretching intermittently due to spasticity.                         OT Short Term Goals - 09/28/16 1941      OT SHORT TERM GOAL #  1   Title -----------------     OT SHORT TERM GOAL #2   Title ----------------     OT SHORT TERM GOAL #3   Title --------------     OT SHORT TERM GOAL #4   Title -----------------------     OT SHORT TERM GOAL #5   Title Pt will be able to cut food mod I.--check updated STGs 10/28/16   Status On-going  08/24/16:  improved and is performing inconsistently;  09/28/16  continues to have difficulty     OT SHORT TERM GOAL #6   Title Pt will be able to write 2-3 sentences with 100% legibility.   Status On-going  08/25/16  approx 75% legibility today.  09/24/16  approx 85% legibility     OT SHORT TERM GOAL #7   Title Pt  will be able to reach to retrieve cylinder objects with RUE from at least 75* shoulder flex with 80% accuracy.   Status New           OT Long Term Goals - 10/01/16 1706      OT LONG TERM GOAL #1   Title --------------------------------   Time --   Period --     OT LONG TERM GOAL #2   Title ---------------------------     OT LONG TERM GOAL #3   Title ----------------------------------------   Period --     OT LONG TERM GOAL #4   Title Pt will improve functional reaching and grasp/release as shown by improving score on box and blocks to at least 25.--check updated LTGs 11/27/16  Revised 07/09/16:  as pt met initial goal of improving by 10   Baseline 3 blocks   Period Weeks   Status On-going  07/09/16  Met, and revised goal;  07/30/16  17 blocks.  09/24/16  21 blocks     OT LONG TERM GOAL #5   Title -------------------------------     OT LONG TERM GOAL #6   Time --     OT LONG TERM GOAL #7   Title Pt will demo improved coordination for ADLs as shown by completing 9-hole peg test in 74mn or less.   Period Weeks   Status On-going  08/25/16:  placed 4 pegs in within 262m, completed in 15m48m37.79sec.  09/24/16:   2mi66m1.97sec     OT LONG TERM GOAL #8   Title Pt will be able to reach to retrieve cylinder objects with RUE from at least 85* shoulder flex with 80% accuracy.   Period Weeks   Status New     OT LONG TERM GOAL  #9   Baseline Pt will be able to manipulate 1 inch object in R hand with at least 75% accuracy.   Status New               Plan - 10/12/16 1436    Clinical Impression Statement Pt demo improved coordination and elbow extension today with functional reaching.  Decr activity tolerance continues to be a barrier.    Rehab Potential Good   OT Frequency 2x / week   OT Duration --  9 weeks   OT Treatment/Interventions Self-care/ADL training;Therapeutic exercise;DME and/or AE instruction;FuncTherapist, nutritionalrapeutic activities;Patient/family  education;Balance training;Neuromuscular education;Electrical Stimulation;Moist Heat;Energy conservation;Passive range of motion;Therapeutic exercises;Splinting;Manual Therapy;Fluidtherapy;Ultrasound;Cryotherapy;Parrafin   Plan RUE functional use, grasp/release, high range reaching in standing   OT Home Exercise Plan Education provided:  06/05/15 Initial HEP (table slides with light wt. bearing, tracing, and flipping cards); 06/22/16 supine ball ex and thumb  opposition; 09/22/16  updates to HEP   Consulted and Agree with Plan of Care Patient   Family Member Consulted husband      Patient will benefit from skilled therapeutic intervention in order to improve the following deficits and impairments:  Decreased coordination, Decreased activity tolerance, Decreased knowledge of use of DME, Decreased strength, Impaired UE functional use, Impaired tone, Decreased range of motion, Decreased balance, Decreased mobility, Difficulty walking  Visit Diagnosis: Muscle weakness (generalized)  Other abnormalities of gait and mobility  Unsteadiness on feet  Hemiplegia and hemiparesis following cerebral infarction affecting right dominant side (HCC)  Other lack of coordination  Other symptoms and signs involving the nervous system    Problem List Patient Active Problem List   Diagnosis Date Noted  . Slow transit constipation   . Acute blood loss anemia   . Adjustment disorder with depressed mood   . Cerebrovascular accident (CVA) ([place]) 02/03/2016  . Dysarthria, post-stroke   . Dysphagia, post-stroke   . Leukocytosis   . Prediabetes   . Right hemiparesis ([place])   . Cerebral infarction due to unspecified mechanism   . Other secondary hypertension   . Overactive bladder   . Diastolic dysfunction   . Hyperlipidemia   . Essential hypertension   . Essential tremor   . Stroke (cerebrum) ([place]) 02/01/2016  . Facial droop due to stroke 02/01/2016  . Essential and other specified forms of tremor  02/20/2013  . Dysphonia 02/20/2013  . Trigeminal neuralgia 02/20/2013    [facility] 10/12/2016, 4:55 PM  [place] [address] [place], [place], [address] Phone: [phone]   Fax:  [phone]  Name: Kelly [name] MRN: [id number] Date of Birth: September 02, 1927   Vianne Bulls, OTR/L [facility] [address] [place], [place]  [address] [phone] phone [phone] 10/12/16 4:55 PM

## 2016-10-13 ENCOUNTER — Ambulatory Visit: Payer: Medicare Other | Admitting: Physical Therapy

## 2016-10-13 ENCOUNTER — Encounter: Payer: Self-pay | Admitting: Physical Therapy

## 2016-10-13 ENCOUNTER — Ambulatory Visit: Payer: Medicare Other | Admitting: Occupational Therapy

## 2016-10-13 DIAGNOSIS — R29818 Other symptoms and signs involving the nervous system: Secondary | ICD-10-CM

## 2016-10-13 DIAGNOSIS — R2681 Unsteadiness on feet: Secondary | ICD-10-CM | POA: Diagnosis not present

## 2016-10-13 DIAGNOSIS — R278 Other lack of coordination: Secondary | ICD-10-CM | POA: Diagnosis not present

## 2016-10-13 DIAGNOSIS — I69351 Hemiplegia and hemiparesis following cerebral infarction affecting right dominant side: Secondary | ICD-10-CM

## 2016-10-13 DIAGNOSIS — R2689 Other abnormalities of gait and mobility: Secondary | ICD-10-CM | POA: Diagnosis not present

## 2016-10-13 DIAGNOSIS — M6281 Muscle weakness (generalized): Secondary | ICD-10-CM | POA: Diagnosis not present

## 2016-10-13 NOTE — Therapy (Signed)
[place] [address] [place], [place], [address] Phone: [phone]   Fax:  [phone]  Occupational Therapy Treatment  Patient Details  Name: Kelly [name] MRN: [id number] Date of Birth: November 27, 1927 Referring Provider: Cecille Rubin, NP  Encounter Date: 10/13/2016      OT End of Session - 10/13/16 1228    Visit Number 35   Number of Visits 63  30+16=46   Date for OT Re-Evaluation 11/27/16   Authorization Type Medicare / [employer] (g-code needed)   Authorization Time Period renewal completed 07/30/16; renewal completed 09/28/16-11/27/16   Authorization - Visit Number 25   Authorization - Number of Visits 40   OT Start Time 1149   OT Stop Time 1230   OT Time Calculation (min) 41 min   Activity Tolerance Patient tolerated treatment well   Behavior During Therapy [facility] for tasks assessed/performed      Past Medical History:  Diagnosis Date  . Acute blood loss anemia   . Adjustment disorder with depressed mood   . CVA (cerebral vascular accident) ([place]) 02/03/2016   Linear infarct within the posterior limb of left internal capsule  . Diastolic CHF ([place])   . Dysphagia   . Dysphonia 02/20/2013  . Essential and other specified forms of tremor 02/20/2013  . HLD (hyperlipidemia)   . HTN (hypertension)   . OAB (overactive bladder)   . Osteoporosis   . Patient receiving subcutaneous heparin    For DVT prophylaxis 9/17  . Prediabetes   . Psoriasis   . Slow transit constipation   . Stroke (cerebrum) ([place]) 02/01/2016  . Trigeminal neuralgia 02/20/2013    Past Surgical History:  Procedure Laterality Date  . APPENDECTOMY  1940  . bladder tack    . CATARACT EXTRACTION Bilateral   . HEMORRHOID SURGERY  1960  . HUMERUS FRACTURE SURGERY    . LAPAROSCOPIC HYSTERECTOMY  2006  . torn rotator cuff    . WRIST FRACTURE SURGERY  2005   seconday shoulder 2001    There were no vitals filed for this visit.      Subjective  Assessment - 10/13/16 1228    Subjective  I've been stretching my hand   Pertinent History adjustment disorder with depressed mood, diastolic CHF, osteoporosis, hx of fall, essential tremor, HTN, prediabetes, trigeminal neuralgia   Limitations fall risk   Patient Stated Goals be able to write, fold clothes, do dishes   Currently in Pain? No/denies      In supine, PROM R shoulder flex, abduction/ER, with elbow ext and wrist/finger ext for full stretch in prep for functional reach.  Then functional reaching in supine for mid-high reaching with min cueing for finger/elbow ext.  Sitting, High level reaching with focus on normal movement patterns including extending elbow and wrist/hand for incr ease for grasp/release.    Tossing/releasing scarves with min-mod cueing/difficulty for incr finger ext. in sitting.  Tossing/catching medium ball with BUEs with min difficulty.  Stacking 1-inch blocks for incr coordination/RUE control with min-mod difficulty, particularly with shoulder control/fatigue.  Wt. Bearing in modified quadraped over yoga blocks with alternating UE reaching to pick up checkers and place in connect 4 slots with min facilitation for decr tone and neuro re-ed.  Arm bike x5 min level 1 for reciprocal movement with improved elbow ext noted today.    AAROM shoulder flex/abduction with UE ranger at mid level with min cues.  OT Short Term Goals - 09/28/16 1941      OT SHORT TERM GOAL #1   Title -----------------     OT SHORT TERM GOAL #2   Title ----------------     OT SHORT TERM GOAL #3   Title --------------     OT SHORT TERM GOAL #4   Title -----------------------     OT SHORT TERM GOAL #5   Title Pt will be able to cut food mod I.--check updated STGs 10/28/16   Status On-going  08/24/16:  improved and is performing inconsistently;  09/28/16  continues to have difficulty     OT SHORT TERM GOAL #6   Title Pt will be able to write 2-3  sentences with 100% legibility.   Status On-going  08/25/16  approx 75% legibility today.  09/24/16  approx 85% legibility     OT SHORT TERM GOAL #7   Title Pt will be able to reach to retrieve cylinder objects with RUE from at least 75* shoulder flex with 80% accuracy.   Status New           OT Long Term Goals - 10/01/16 1706      OT LONG TERM GOAL #1   Title --------------------------------   Time --   Period --     OT LONG TERM GOAL #2   Title ---------------------------     OT LONG TERM GOAL #3   Title ----------------------------------------   Period --     OT LONG TERM GOAL #4   Title Pt will improve functional reaching and grasp/release as shown by improving score on box and blocks to at least 25.--check updated LTGs 11/27/16  Revised 07/09/16:  as pt met initial goal of improving by 10   Baseline 3 blocks   Period Weeks   Status On-going  07/09/16  Met, and revised goal;  07/30/16  17 blocks.  09/24/16  21 blocks     OT LONG TERM GOAL #5   Title -------------------------------     OT LONG TERM GOAL #6   Time --     OT LONG TERM GOAL #7   Title Pt will demo improved coordination for ADLs as shown by completing 9-hole peg test in 17mn or less.   Period Weeks   Status On-going  08/25/16:  placed 4 pegs in within 230m, completed in 52m29m37.79sec.  09/24/16:   2mi352m1.97sec     OT LONG TERM GOAL #8   Title Pt will be able to reach to retrieve cylinder objects with RUE from at least 85* shoulder flex with 80% accuracy.   Period Weeks   Status New     OT LONG TERM GOAL  #9   Baseline Pt will be able to manipulate 1 inch object in R hand with at least 75% accuracy.   Status New               Plan - 10/13/16 1229    Clinical Impression Statement Pt continues to demo improved elbow extension and finger extension for hight range reaching.  Decr activity tolerance continues to limit RUE functional use and is a source of frustration.   Rehab Potential Good   OT  Frequency 2x / week   OT Duration --  9 weeks   OT Treatment/Interventions Self-care/ADL training;Therapeutic exercise;DME and/or AE instruction;FuncTherapist, nutritionalrapeutic activities;Patient/family education;Balance training;Neuromuscular education;Electrical Stimulation;Moist Heat;Energy conservation;Passive range of motion;Therapeutic exercises;Splinting;Manual Therapy;Fluidtherapy;Ultrasound;Cryotherapy;Parrafin   Plan RUE functional use, grasp/release, neuro re-ed   OT Home Exercise Plan  Education provided:  06/05/15 Initial HEP (table slides with light wt. bearing, tracing, and flipping cards); 06/22/16 supine ball ex and thumb opposition; 09/22/16  updates to HEP   Consulted and Agree with Plan of Care Patient   Family Member Consulted husband      Patient will benefit from skilled therapeutic intervention in order to improve the following deficits and impairments:  Decreased coordination, Decreased activity tolerance, Decreased knowledge of use of DME, Decreased strength, Impaired UE functional use, Impaired tone, Decreased range of motion, Decreased balance, Decreased mobility, Difficulty walking  Visit Diagnosis: Hemiplegia and hemiparesis following cerebral infarction affecting right dominant side (HCC)  Muscle weakness (generalized)  Other lack of coordination  Unsteadiness on feet  Other abnormalities of gait and mobility  Other symptoms and signs involving the nervous system    Problem List Patient Active Problem List   Diagnosis Date Noted  . Slow transit constipation   . Acute blood loss anemia   . Adjustment disorder with depressed mood   . Cerebrovascular accident (CVA) ([place]) 02/03/2016  . Dysarthria, post-stroke   . Dysphagia, post-stroke   . Leukocytosis   . Prediabetes   . Right hemiparesis ([place])   . Cerebral infarction due to unspecified mechanism   . Other secondary hypertension   . Overactive bladder   . Diastolic dysfunction   .  Hyperlipidemia   . Essential hypertension   . Essential tremor   . Stroke (cerebrum) ([place]) 02/01/2016  . Facial droop due to stroke 02/01/2016  . Essential and other specified forms of tremor 02/20/2013  . Dysphonia 02/20/2013  . Trigeminal neuralgia 02/20/2013    [facility] 10/13/2016, 12:30 PM  [place] [address] [place], [place], [address] Phone: [phone]   Fax:  [phone]  Name: Kelly [name] MRN: [id number] Date of Birth: [birth year]   Vianne Bulls, OTR/L [facility] [address] [place], [place]  [address] [phone] phone [phone] 10/13/16 12:30 PM

## 2016-10-13 NOTE — Patient Instructions (Addendum)
Pectoralis Stretch With Scapula Adduction: Supine (Towel Roll)    Lie with rolled towel along spine and arms resting at your sides.  Hold for 1 minute. Do _2__ times, __1-2_ times per day.  http://ss.exer.us/357   Copyright  VHI. All rights reserved.

## 2016-10-13 NOTE — Therapy (Signed)
[facility] [address] [place], [place], [address] Phone: [phone]   Fax:  [phone]  Physical Therapy Treatment  Patient Details  Name: Kelly [name] MRN: [id number] Date of Birth: 1927-12-09 Referring Provider: Cecille Rubin  Encounter Date: 10/13/2016      PT End of Session - 10/13/16 1110    Visit Number 82  G7 5/3 recert)   Number of Visits 52  recert 5/3 for 8 wks   Date for PT Re-Evaluation 11/20/16   Authorization Type Medicare/[employer] every 10th visit   Authorization Time Period 07/30/16- 09/25/16;   09/24/16 to 11/20/16   PT Start Time 1105   PT Stop Time 1145   PT Time Calculation (min) 40 min   Activity Tolerance Patient tolerated treatment well   Behavior During Therapy [facility] for tasks assessed/performed      Past Medical History:  Diagnosis Date  . Acute blood loss anemia   . Adjustment disorder with depressed mood   . CVA (cerebral vascular accident) ([place]) 02/03/2016   Linear infarct within the posterior limb of left internal capsule  . Diastolic CHF ([place])   . Dysphagia   . Dysphonia 02/20/2013  . Essential and other specified forms of tremor 02/20/2013  . HLD (hyperlipidemia)   . HTN (hypertension)   . OAB (overactive bladder)   . Osteoporosis   . Patient receiving subcutaneous heparin    For DVT prophylaxis 9/17  . Prediabetes   . Psoriasis   . Slow transit constipation   . Stroke (cerebrum) ([place]) 02/01/2016  . Trigeminal neuralgia 02/20/2013    Past Surgical History:  Procedure Laterality Date  . APPENDECTOMY  1940  . bladder tack    . CATARACT EXTRACTION Bilateral   . HEMORRHOID SURGERY  1960  . HUMERUS FRACTURE SURGERY    . LAPAROSCOPIC HYSTERECTOMY  2006  . torn rotator cuff    . WRIST FRACTURE SURGERY  2005   seconday shoulder 2001    There were no vitals filed for this visit.      Subjective Assessment - 10/13/16 1109    Subjective No new complaitns. No  falls or pain to report.    Patient is accompained by: Family member   Patient Stated Goals Pt's goal for therapy is to walk again.   Currently in Pain? No/denies   Pain Score 0-No pain            [facility] Adult PT Treatment/Exercise - 10/13/16 1115      Exercises   Other Exercises  in hooklying: chin tucks with 5 sec holds x 20 reps, mod verbal/tactile cues on correct form/technique for first 6 reps, then min reminder cues during remaining reps.     Knee/Hip Exercises: Standing   Terminal Knee Extension Strengthening;Right;1 set;10 reps;Theraband;Limitations   Theraband Level (Terminal Knee Extension) Level 2 (Red)   Terminal Knee Extension Limitations with 5 sec holds each rep; assist/stability at hip to prevent compensatory movements     Knee/Hip Exercises: Seated   Long Arc Quad Strengthening;Both;1 set;10 reps;Weights   Long Arc Quad Weight 3 lbs.   Long [employer] Limitations 5 sec hold with each rep   Hamstring Curl Strengthening;Right;10 reps;Limitations;1 set   Hamstring Limitations with red band- 5 sec holds before releasing and bringing foot back up     Knee/Hip Exercises: Supine   Straight Leg Raises --   Other Supine Knee/Hip Exercises bridge with bil legs: 5 sec holds x 10 reps with cues to  lift hips a high as possible. seated edge of mat: chin tucks with tactile/verbal cues on correct form/technique with 5 sec holds x 10 reps, shoulder horizontal abduction with scapular squeezes x 10 reps with no resistance, cues on form/technique; V<>Y x 15 reps with verbal/tactile cues on correct ex form/technique     Neck Exercises: Stretches   Chest Stretch 1 rep;60 seconds;Limitations   Chest Stretch Limitations hook lying with towel roll along spine and arms out to side for chest/pec stretch.              PT Education - 10/13/16 1508    Education provided Yes   Education Details added hook lying towel stretch to HEP for chest/pec stretch for postural correction    Person(s) Educated Patient;Spouse   Methods Explanation;Demonstration;Verbal cues;Handout   Comprehension Verbalized understanding;Returned demonstration;Verbal cues required;Need further instruction          PT Short Term Goals - 09/25/16 1508      PT SHORT TERM GOAL #1   Title Pt will perform HEP with family's supervision, to address balance, gait, transfers and strengthening.  TARGET 07/01/16 (NEW TARGET DATE FOR ALL CURRENT STGs 08/30/16)   Baseline 06/29/16: met per pt and spouse report   Status Achieved     PT SHORT TERM GOAL #2   Title Pt will improve Berg Balance score to at least 26/56 for decreased fall risk.   Baseline 06/29/16: 30/56 scored today   Status Achieved     PT SHORT TERM GOAL #3   Title Pt will improve TUG score to less than or equal to 50 seconds for decreased fall risk.   Baseline 06/29/16: 23.81 with RW   Status Achieved     PT SHORT TERM GOAL #4   Title Pt will transfer sit<>stand, modified independently, at least 4 of 5 trials, to demo improved lower extremity strength and transfer efficiency.   Baseline 06/29/16: pt at supervision level with cues for safety/sequencing needed at times   Status Not Met     PT SHORT TERM GOAL #5   Title Pt will ambulate at least 200 ft using RW, supervision, for improved gait safety and efficiency.   Baseline 06/29/16: met today with RW   Status Achieved     Additional Short Term Goals   Additional Short Term Goals Yes     PT SHORT TERM GOAL #6   Title Pt/family will verbalize understanding of CVA education.   Time 4   Period Weeks   Status Achieved     PT SHORT TERM GOAL #7   Title ----------------------------------------------------------------------------------------------------     PT SHORT TERM GOAL #8   Title Patient will demonstrate sit to stand 8 of 10 times with correct sequencing for safety with supervision but no cues. (TARGET for STGs 10-23-16)   Time 4   Period Weeks   Status New     PT SHORT TERM GOAL #9    TITLE Patient will ascend/descend curb with RW with minguard assist (no physical assist, but close proximity) without verbal cues.    Time 4   Period Weeks   Status New           PT Long Term Goals - 09/24/16 1451      PT LONG TERM GOAL #1   Title Pt/family will verbalize understanding of fall prevention in the home environment.  TARGET 07/31/16 (NEW TARGET DATE FOR ALL CURRENT LTGS 09/25/16)  5/4 MET   Time 8   Period Weeks  Status Achieved     PT LONG TERM GOAL #2   Title Pt will improve Berg Balance score to at least 31/56 for decreased fall risk.  09/24/16   32/56   Time 8   Period Weeks   Status Achieved     PT LONG TERM GOAL #3   Title Pt will improve TUG score to less than or equal to 45 seconds for decreased fall risk.     Baseline 33.41 sec 07/28/16  5/3 23.21 sec   Time 8   Period Weeks   Status Achieved     PT LONG TERM GOAL #4   Title Pt will improve gait velocity to at least 1.8 ft/sec for improved gait efficiency and safety.   Baseline 1.56 ft/sec with RW 07/28/16;  09/24/16  2.23 ft/sec   Time 8   Period Weeks   Status Achieved     PT LONG TERM GOAL #5   Title Pt will ambulate at least 800 ft indoor and outdoor surfaces, using RW, modified independently, for improved outdoor and community. (NEW TARGET date 11-20-16)   Baseline 5/2 did 600 ft   Time 8   Period Weeks   Status On-going     Additional Long Term Goals   Additional Long Term Goals Yes     PT LONG TERM GOAL #6   Title Pt will improve Neuro QOL score on FOTO by at least 20% for improved functional mobility. (NEW TARGET date 11-20-16)   Time 8   Period Weeks   Status On-going     PT LONG TERM GOAL #7   Title Patient will increase Berg score to 39/56 to indicate lesser fall risk. (TARGET date 11-20-16)   Time 8   Period Weeks   Status New     PT LONG TERM GOAL #8   Title Patient will increase her gait velocity to >=2.45 ft/sec indicative of increased safety with community ambulation. (TARGET date  11-20-16)   Time 8   Period Weeks   Status New             Plan - 10/13/16 1110    Clinical Impression Statement Today's skilled session focused on postural strengthening/stretching and right LE strengthening for improved knee control with gait. No issues were reported. Pt is making steady progress toward goals and should benefit from continued PT to progress toward unmet goals.    Rehab Potential Good   PT Frequency 2x / week   PT Duration 8 weeks   PT Treatment/Interventions ADLs/Self Care Home Management;Functional mobility training;Stair training;Gait training;Patient/family education;Neuromuscular re-education;Balance training;Therapeutic exercise;Therapeutic activities;DME Instruction   PT Next Visit Plan continue to try and progress to sitting/standing with chin tucks; gait distance with ramps/curbs included, right LE strengthening/stretching   Consulted and Agree with Plan of Care Patient;Family member/caregiver   Family Member Consulted Husband      Patient will benefit from skilled therapeutic intervention in order to improve the following deficits and impairments:  Abnormal gait, Decreased balance, Decreased cognition, Decreased knowledge of use of DME, Decreased range of motion, Decreased safety awareness, Decreased strength  Visit Diagnosis: Muscle weakness (generalized)  Hemiplegia and hemiparesis following cerebral infarction affecting right dominant side (HCC)  Other symptoms and signs involving the nervous system     Problem List Patient Active Problem List   Diagnosis Date Noted  . Slow transit constipation   . Acute blood loss anemia   . Adjustment disorder with depressed mood   . Cerebrovascular accident (CVA) ([place]) 02/03/2016  .  Dysarthria, post-stroke   . Dysphagia, post-stroke   . Leukocytosis   . Prediabetes   . Right hemiparesis ([place])   . Cerebral infarction due to unspecified mechanism   . Other secondary hypertension   . Overactive bladder    . Diastolic dysfunction   . Hyperlipidemia   . Essential hypertension   . Essential tremor   . Stroke (cerebrum) ([place]) 02/01/2016  . Facial droop due to stroke 02/01/2016  . Essential and other specified forms of tremor 02/20/2013  . Dysphonia 02/20/2013  . Trigeminal neuralgia 02/20/2013    Willow Ora, PTA, [facility] [address], [place], [place] [address] [phone] 10/13/16, 3:13 PM   Name: Kelly [name] MRN: [id number] Date of Birth: [birth year]

## 2016-10-20 ENCOUNTER — Ambulatory Visit: Payer: Medicare Other | Admitting: Occupational Therapy

## 2016-10-20 ENCOUNTER — Encounter: Payer: Self-pay | Admitting: Physical Therapy

## 2016-10-20 ENCOUNTER — Ambulatory Visit: Payer: Medicare Other | Admitting: Physical Therapy

## 2016-10-20 DIAGNOSIS — M6281 Muscle weakness (generalized): Secondary | ICD-10-CM | POA: Diagnosis not present

## 2016-10-20 DIAGNOSIS — R2689 Other abnormalities of gait and mobility: Secondary | ICD-10-CM

## 2016-10-20 DIAGNOSIS — R29818 Other symptoms and signs involving the nervous system: Secondary | ICD-10-CM

## 2016-10-20 DIAGNOSIS — I69351 Hemiplegia and hemiparesis following cerebral infarction affecting right dominant side: Secondary | ICD-10-CM | POA: Diagnosis not present

## 2016-10-20 DIAGNOSIS — R2681 Unsteadiness on feet: Secondary | ICD-10-CM | POA: Diagnosis not present

## 2016-10-20 DIAGNOSIS — R278 Other lack of coordination: Secondary | ICD-10-CM | POA: Diagnosis not present

## 2016-10-20 NOTE — Therapy (Signed)
[facility] [address] [place], [place], [address] Phone: [phone]   Fax:  [phone]  Occupational Therapy Treatment  Patient Details  Name: Kelly [name] MRN: [id number] Date of Birth: Sep 25, 1927 Referring Provider: Cecille Rubin, NP  Encounter Date: 10/20/2016      OT End of Session - 10/20/16 1210    Visit Number 36   Number of Visits 46  30+16=46   Date for OT Re-Evaluation 11/27/16   Authorization Type Medicare / [employer] (g-code needed)   Authorization Time Period renewal completed 07/30/16; renewal completed 09/28/16-11/27/16   Authorization - Visit Number 26   Authorization - Number of Visits 40   OT Start Time 1103   OT Stop Time 1151   OT Time Calculation (min) 48 min   Activity Tolerance Patient tolerated treatment well   Behavior During Therapy [facility] for tasks assessed/performed      Past Medical History:  Diagnosis Date  . Acute blood loss anemia   . Adjustment disorder with depressed mood   . CVA (cerebral vascular accident) ([place]) 02/03/2016   Linear infarct within the posterior limb of left internal capsule  . Diastolic CHF ([place])   . Dysphagia   . Dysphonia 02/20/2013  . Essential and other specified forms of tremor 02/20/2013  . HLD (hyperlipidemia)   . HTN (hypertension)   . OAB (overactive bladder)   . Osteoporosis   . Patient receiving subcutaneous heparin    For DVT prophylaxis 9/17  . Prediabetes   . Psoriasis   . Slow transit constipation   . Stroke (cerebrum) ([place]) 02/01/2016  . Trigeminal neuralgia 02/20/2013    Past Surgical History:  Procedure Laterality Date  . APPENDECTOMY  1940  . bladder tack    . CATARACT EXTRACTION Bilateral   . HEMORRHOID SURGERY  1960  . HUMERUS FRACTURE SURGERY    . LAPAROSCOPIC HYSTERECTOMY  2006  . torn rotator cuff    . WRIST FRACTURE SURGERY  2005   seconday shoulder 2001    There were no vitals filed for this visit.      Subjective  Assessment - 10/20/16 1210    Subjective  Pt reports that she cut watermelon into smaller pieces    Pertinent History adjustment disorder with depressed mood, diastolic CHF, osteoporosis, hx of fall, essential tremor, HTN, prediabetes, trigeminal neuralgia   Limitations fall risk   Patient Stated Goals be able to write, fold clothes, do dishes   Currently in Pain? No/denies     checked goals and discussed progress (see goal section) and anticipated d/c at end of current POC (with possible re-eval with new MD referral in approx 3-57month).  Discussed importance of continued stretching of RUE, participation in daily activities/RUE functional use, and grasp/release of various objects after d/c.  Answered questions regarding home program.  Pt/husband verbalized understanding.  In supine, PROM R abduction/ER, with elbow ext and wrist/finger ext for full stretch in prep for functional reach.   In supine, shoulder flex/chest press with ball.  Sitting, mid-High level reaching with focus on normal movement patterns including extending elbow and wrist/hand for incr ease for grasp/release of cylinder objects.  Then standing with functional mid-range reaching to grasp/release cylinder objects with difficulty with releasing with wt. Shift to the R for incr balance.   Recommended pt perform reaching in sitting, standing and at different heights at home to incr challenge.  Checked 9-hole peg test and box and blocks test--see below.  Writing with approx 95%  legibility for first sentence and approx 85% for 2nd sentence.  Pt wrote with pencil (attempted on lower surface, but pt reports incr difficulty).           OT Short Term Goals - 10/20/16 1138      OT SHORT TERM GOAL #1   Title -----------------     OT SHORT TERM GOAL #2   Title ----------------     OT SHORT TERM GOAL #3   Title --------------     OT SHORT TERM GOAL #4   Title -----------------------     OT SHORT TERM GOAL #5   Title Pt will  be able to cut food mod I.--check updated STGs 10/28/16   Status Partially Met  08/24/16:  improved and is performing inconsistently;  09/28/16  continues to have difficulty.  10/20/16  pt able to cut soft food and was able to return demo use of rocker knife for simulated meat cutting in clinic     OT SHORT TERM GOAL #6   Title Pt will be able to write 1-2 sentences with 100% legibility.   Status Revised  08/25/16  approx 75% legibility today.  09/24/16  approx 85% legibility.  10/20/16  1 sentence with approx 95% legibility, 2nd sentence with approx 85% legibility     OT SHORT TERM GOAL #7   Title Pt will be able to reach to retrieve cylinder objects with RUE from at least 75* shoulder flex with 80% accuracy.   Status Achieved  10/20/16  met with approx 80% accuracy in sitting           OT Long Term Goals - 10/20/16 1128      OT LONG TERM GOAL #1   Title --------------------------------     OT LONG TERM GOAL #2   Title ---------------------------     OT LONG TERM GOAL #3   Title ----------------------------------------     OT LONG TERM GOAL #4   Title Pt will improve functional reaching and grasp/release as shown by improving score on box and blocks to at least 25.--check updated LTGs 11/27/16  Revised 07/09/16:  as pt met initial goal of improving by 10   Baseline 3 blocks   Period Weeks   Status Achieved  07/09/16  Met, and revised goal;  07/30/16  17 blocks.  09/24/16  21 blocks.  10/20/16:  25 blocks     OT LONG TERM GOAL #5   Title -------------------------------     OT LONG TERM GOAL #7   Title Pt will demo improved coordination for ADLs as shown by completing 9-hole peg test in 2min or less.   Period Weeks   Status Achieved  08/25/16:  placed 4 pegs in within 2min, completed in 3min 37.79sec.  09/24/16:   2min 11.97sec.  10/20/16  1min 40.38sec     OT LONG TERM GOAL #8   Title Pt will be able to reach to retrieve cylinder objects with RUE from at least 85* shoulder flex with 80%  accuracy.   Period Weeks   Status New     OT LONG TERM GOAL  #9   Baseline Pt will be able to manipulate 1 inch object in R hand with at least 75% accuracy.   Status On-going               Plan - 10/20/16 1207    Clinical Impression Statement Pt is progressing towards goals with improved higher range functional reaching and improved coordination.  LTG#4 & 7 met.    Pt continues to demo difficulty writing.   Rehab Potential Good   OT Frequency 2x / week   OT Duration --  9 weeks   OT Treatment/Interventions Self-care/ADL training;Therapeutic exercise;DME and/or AE instruction;Therapist, nutritional;Therapeutic activities;Patient/family education;Balance training;Neuromuscular education;Electrical Stimulation;Moist Heat;Energy conservation;Passive range of motion;Therapeutic exercises;Splinting;Manual Therapy;Fluidtherapy;Ultrasound;Cryotherapy;Parrafin   OT Home Exercise Plan Education provided:  06/05/15 Initial HEP (table slides with light wt. bearing, tracing, and flipping cards); 06/22/16 supine ball ex and thumb opposition; 09/22/16  updates to HEP   Consulted and Agree with Plan of Care Patient   Family Member Consulted husband   Plan continue with RUE functional use, grasp/release and in-hand manipulation, neuro re-ed      Patient will benefit from skilled therapeutic intervention in order to improve the following deficits and impairments:  Decreased coordination, Decreased activity tolerance, Decreased knowledge of use of DME, Decreased strength, Impaired UE functional use, Impaired tone, Decreased range of motion, Decreased balance, Decreased mobility, Difficulty walking  Visit Diagnosis: Hemiplegia and hemiparesis following cerebral infarction affecting right dominant side (HCC)  Other symptoms and signs involving the nervous system  Other lack of coordination  Muscle weakness (generalized)  Unsteadiness on feet  Other abnormalities of gait and  mobility    Problem List Patient Active Problem List   Diagnosis Date Noted  . Slow transit constipation   . Acute blood loss anemia   . Adjustment disorder with depressed mood   . Cerebrovascular accident (CVA) ([place]) 02/03/2016  . Dysarthria, post-stroke   . Dysphagia, post-stroke   . Leukocytosis   . Prediabetes   . Right hemiparesis ([place])   . Cerebral infarction due to unspecified mechanism   . Other secondary hypertension   . Overactive bladder   . Diastolic dysfunction   . Hyperlipidemia   . Essential hypertension   . Essential tremor   . Stroke (cerebrum) ([place]) 02/01/2016  . Facial droop due to stroke 02/01/2016  . Essential and other specified forms of tremor 02/20/2013  . Dysphonia 02/20/2013  . Trigeminal neuralgia 02/20/2013    [facility] 10/20/2016, 12:23 PM  [facility] [address] [place], [place], [address] Phone: [phone]   Fax:  [phone]  Name: WILHEMINA GRALL MRN: [id number] Date of Birth: 04-03-1928   Vianne Bulls, OTR/L [facility] [address] [place], [place]  [address] [phone] phone [phone] 10/20/16 12:23 PM

## 2016-10-20 NOTE — Therapy (Signed)
[facility] [address] [place], [place], [address] Phone: [phone]   Fax:  [phone]  Physical Therapy Treatment  Patient Details  Name: Kelly [name] MRN: [id number] Date of Birth: 1928-04-02 Referring Provider: Cecille Rubin  Encounter Date: 10/20/2016      PT End of Session - 10/20/16 2143    Visit Number 60  G8 5/3 recert)   Number of Visits 52  recert 5/3 for 8 wks   Date for PT Re-Evaluation 11/20/16   Authorization Type Medicare/[employer] every 10th visit   Authorization Time Period 07/30/16- 09/25/16;   09/24/16 to 11/20/16   PT Start Time 1153   PT Stop Time 1233   PT Time Calculation (min) 40 min   Equipment Utilized During Treatment Gait belt   Activity Tolerance Patient tolerated treatment well   Behavior During Therapy [facility] for tasks assessed/performed      Past Medical History:  Diagnosis Date  . Acute blood loss anemia   . Adjustment disorder with depressed mood   . CVA (cerebral vascular accident) ([place]) 02/03/2016   Linear infarct within the posterior limb of left internal capsule  . Diastolic CHF ([place])   . Dysphagia   . Dysphonia 02/20/2013  . Essential and other specified forms of tremor 02/20/2013  . HLD (hyperlipidemia)   . HTN (hypertension)   . OAB (overactive bladder)   . Osteoporosis   . Patient receiving subcutaneous heparin    For DVT prophylaxis 9/17  . Prediabetes   . Psoriasis   . Slow transit constipation   . Stroke (cerebrum) ([place]) 02/01/2016  . Trigeminal neuralgia 02/20/2013    Past Surgical History:  Procedure Laterality Date  . APPENDECTOMY  1940  . bladder tack    . CATARACT EXTRACTION Bilateral   . HEMORRHOID SURGERY  1960  . HUMERUS FRACTURE SURGERY    . LAPAROSCOPIC HYSTERECTOMY  2006  . torn rotator cuff    . WRIST FRACTURE SURGERY  2005   seconday shoulder 2001    There were no vitals filed for this visit.      Subjective Assessment -  10/20/16 2133    Subjective No new complaitns. No falls or pain to report. Walked twice as far as she [place] does at the park this [place].   Patient is accompained by: Family member   Patient Stated Goals Pt's goal for therapy is to walk again.   Currently in Pain? No/denies                         [facility] Adult PT Treatment/Exercise - 10/20/16 0001      Transfers   Transfers Sit to Stand;Stand to Sit   Sit to Stand 4: Min guard;With upper extremity assist;From bed   Sit to Stand Details (indicate cue type and reason) standing on blue airex foam x 10 reps; no use of UEs on RW as coming to stand/balance   Stand to Sit 4: Min guard     Ambulation/Gait   Ambulation/Gait Assistance 4: Min guard;5: Supervision   Ambulation/Gait Assistance Details on initial arrival, pt ambulating the most upright this PT has observed with good foot clearance and only required supervision; as she fatigued (~120 ft) she began to have decr foot clearance, stooped posture and needed minguard for safety   Ambulation Distance (Feet) 150 Feet  120, 40   Assistive device Rolling walker   Gait Pattern Step-through pattern;Decreased stride length;Decreased dorsiflexion - right;Right  genu recurvatum;Poor foot clearance - right   Ambulation Surface Level;Indoor   Ramp 4: Min assist   Ramp Details (indicate cue type and reason) improved advancing feet without dragging; min assist to maintain balance as advancing RW farther up ramp   Curb 4: Min assist   Curb Details (indicate cue type and reason) minguard assist while pt lifting/lowering RW up/down curb (x2 reps); pt reports there is a single step in her walkway to her car that she and husband negotiate daily     Posture/Postural Control   Posture/Postural Control Postural limitations   Postural Limitations Rounded Shoulders;Forward head;Increased thoracic kyphosis   Posture Comments seated chin tucks with moderate cues             Balance  Exercises - 10/20/16 2141      Balance Exercises: Standing   Standing Eyes Closed Narrow base of support (BOS);Wide (BOA);Solid surface;30 secs  feet apart, together, staggered bil   SLS Eyes open;Solid surface;Upper extremity support 2;2 reps;10 secs   Tandem Gait Upper extremity support;Forward;1 rep   Retro Gait Upper extremity support;2 reps   Sidestepping Upper extremity support;4 reps   Marching Limitations bil UE support x 1 length counter             PT Short Term Goals - 09/25/16 1508      PT SHORT TERM GOAL #1   Title Pt will perform HEP with family's supervision, to address balance, gait, transfers and strengthening.  TARGET 07/01/16 (NEW TARGET DATE FOR ALL CURRENT STGs 08/30/16)   Baseline 06/29/16: met per pt and spouse report   Status Achieved     PT SHORT TERM GOAL #2   Title Pt will improve Berg Balance score to at least 26/56 for decreased fall risk.   Baseline 06/29/16: 30/56 scored today   Status Achieved     PT SHORT TERM GOAL #3   Title Pt will improve TUG score to less than or equal to 50 seconds for decreased fall risk.   Baseline 06/29/16: 23.81 with RW   Status Achieved     PT SHORT TERM GOAL #4   Title Pt will transfer sit<>stand, modified independently, at least 4 of 5 trials, to demo improved lower extremity strength and transfer efficiency.   Baseline 06/29/16: pt at supervision level with cues for safety/sequencing needed at times   Status Not Met     PT SHORT TERM GOAL #5   Title Pt will ambulate at least 200 ft using RW, supervision, for improved gait safety and efficiency.   Baseline 06/29/16: met today with RW   Status Achieved     Additional Short Term Goals   Additional Short Term Goals Yes     PT SHORT TERM GOAL #6   Title Pt/family will verbalize understanding of CVA education.   Time 4   Period Weeks   Status Achieved     PT SHORT TERM GOAL #7   Title  ----------------------------------------------------------------------------------------------------     PT SHORT TERM GOAL #8   Title Patient will demonstrate sit to stand 8 of 10 times with correct sequencing for safety with supervision but no cues. (TARGET for STGs 10-23-16)   Time 4   Period Weeks   Status New     PT SHORT TERM GOAL #9   TITLE Patient will ascend/descend curb with RW with minguard assist (no physical assist, but close proximity) without verbal cues.    Time 4   Period Weeks   Status New  PT Long Term Goals - 09/24/16 1451      PT LONG TERM GOAL #1   Title Pt/family will verbalize understanding of fall prevention in the home environment.  TARGET 07/31/16 (NEW TARGET DATE FOR ALL CURRENT LTGS 09/25/16)  5/4 MET   Time 8   Period Weeks   Status Achieved     PT LONG TERM GOAL #2   Title Pt will improve Berg Balance score to at least 31/56 for decreased fall risk.  09/24/16   32/56   Time 8   Period Weeks   Status Achieved     PT LONG TERM GOAL #3   Title Pt will improve TUG score to less than or equal to 45 seconds for decreased fall risk.     Baseline 33.41 sec 07/28/16  5/3 23.21 sec   Time 8   Period Weeks   Status Achieved     PT LONG TERM GOAL #4   Title Pt will improve gait velocity to at least 1.8 ft/sec for improved gait efficiency and safety.   Baseline 1.56 ft/sec with RW 07/28/16;  09/24/16  2.23 ft/sec   Time 8   Period Weeks   Status Achieved     PT LONG TERM GOAL #5   Title Pt will ambulate at least 800 ft indoor and outdoor surfaces, using RW, modified independently, for improved outdoor and community. (NEW TARGET date 11-20-16)   Baseline 5/2 did 600 ft   Time 8   Period Weeks   Status On-going     Additional Long Term Goals   Additional Long Term Goals Yes     PT LONG TERM GOAL #6   Title Pt will improve Neuro QOL score on FOTO by at least 20% for improved functional mobility. (NEW TARGET date 11-20-16)   Time 8   Period Weeks    Status On-going     PT LONG TERM GOAL #7   Title Patient will increase Berg score to 39/56 to indicate lesser fall risk. (TARGET date 11-20-16)   Time 8   Period Weeks   Status New     PT LONG TERM GOAL #8   Title Patient will increase her gait velocity to >=2.45 ft/sec indicative of increased safety with community ambulation. (TARGET date 11-20-16)   Time 8   Period Weeks   Status New               Plan - 10/20/16 2145    Clinical Impression Statement Skilled session focused on LE strengthening, gait and balance training. Patient's activity tolerance continues to improve (only one seated rest break in 40 minutes) and upright posture with improved bil foot clearance improved while walking initial 120 ft. Continue to progress towards goals.    Rehab Potential Good   PT Frequency 2x / week   PT Duration 8 weeks   PT Treatment/Interventions ADLs/Self Care Home Management;Functional mobility training;Stair training;Gait training;Patient/family education;Neuromuscular re-education;Balance training;Therapeutic exercise;Therapeutic activities;DME Instruction   PT Next Visit Plan continue to address posture in standing/sitting including chin tucks; gait distance (including outside); right LE strengthening/stretching   Consulted and Agree with Plan of Care Patient;Family member/caregiver   Family Member Consulted Husband      Patient will benefit from skilled therapeutic intervention in order to improve the following deficits and impairments:  Abnormal gait, Decreased balance, Decreased cognition, Decreased knowledge of use of DME, Decreased range of motion, Decreased safety awareness, Decreased strength  Visit Diagnosis: Hemiplegia and hemiparesis following cerebral infarction affecting right  dominant side (HCC)  Muscle weakness (generalized)  Unsteadiness on feet  Other abnormalities of gait and mobility     Problem List Patient Active Problem List   Diagnosis Date Noted  .  Slow transit constipation   . Acute blood loss anemia   . Adjustment disorder with depressed mood   . Cerebrovascular accident (CVA) ([place]) 02/03/2016  . Dysarthria, post-stroke   . Dysphagia, post-stroke   . Leukocytosis   . Prediabetes   . Right hemiparesis ([place])   . Cerebral infarction due to unspecified mechanism   . Other secondary hypertension   . Overactive bladder   . Diastolic dysfunction   . Hyperlipidemia   . Essential hypertension   . Essential tremor   . Stroke (cerebrum) ([place]) 02/01/2016  . Facial droop due to stroke 02/01/2016  . Essential and other specified forms of tremor 02/20/2013  . Dysphonia 02/20/2013  . Trigeminal neuralgia 02/20/2013    Rexanne Mano, PT 10/20/2016, 9:48 PM  [facility] [address] [place], [place], [address] Phone: [phone]   Fax:  [phone]  Name: BRIANAH HOPSON MRN: [id number] Date of Birth: Apr 28, 1928

## 2016-10-22 ENCOUNTER — Ambulatory Visit: Payer: Medicare Other | Admitting: Occupational Therapy

## 2016-10-22 ENCOUNTER — Encounter: Payer: Self-pay | Admitting: Physical Therapy

## 2016-10-22 ENCOUNTER — Ambulatory Visit: Payer: Medicare Other | Admitting: Physical Therapy

## 2016-10-22 DIAGNOSIS — R29818 Other symptoms and signs involving the nervous system: Secondary | ICD-10-CM | POA: Diagnosis not present

## 2016-10-22 DIAGNOSIS — M6281 Muscle weakness (generalized): Secondary | ICD-10-CM

## 2016-10-22 DIAGNOSIS — R2689 Other abnormalities of gait and mobility: Secondary | ICD-10-CM | POA: Diagnosis not present

## 2016-10-22 DIAGNOSIS — I69351 Hemiplegia and hemiparesis following cerebral infarction affecting right dominant side: Secondary | ICD-10-CM

## 2016-10-22 DIAGNOSIS — R2681 Unsteadiness on feet: Secondary | ICD-10-CM | POA: Diagnosis not present

## 2016-10-22 DIAGNOSIS — R278 Other lack of coordination: Secondary | ICD-10-CM

## 2016-10-22 NOTE — Therapy (Signed)
[facility] [address] [place], [place], [address] Phone: [phone]   Fax:  [phone]  Occupational Therapy Treatment  Patient Details  Name: Kelly [name] MRN: [id number] Date of Birth: 02/05/1928 Referring Provider: Cecille Rubin, NP  Encounter Date: 10/22/2016      OT End of Session - 10/22/16 1622    Visit Number 37   Number of Visits 17  30+16=46   Date for OT Re-Evaluation 11/27/16   Authorization Type Medicare / [employer] (g-code needed)   Authorization Time Period renewal completed 07/30/16; renewal completed 09/28/16-11/27/16   Authorization - Visit Number 74   Authorization - Number of Visits 40   OT Start Time 1451   OT Stop Time 1540   OT Time Calculation (min) 49 min   Activity Tolerance Patient tolerated treatment well   Behavior During Therapy [facility] for tasks assessed/performed      Past Medical History:  Diagnosis Date  . Acute blood loss anemia   . Adjustment disorder with depressed mood   . CVA (cerebral vascular accident) ([place]) 02/03/2016   Linear infarct within the posterior limb of left internal capsule  . Diastolic CHF ([place])   . Dysphagia   . Dysphonia 02/20/2013  . Essential and other specified forms of tremor 02/20/2013  . HLD (hyperlipidemia)   . HTN (hypertension)   . OAB (overactive bladder)   . Osteoporosis   . Patient receiving subcutaneous heparin    For DVT prophylaxis 9/17  . Prediabetes   . Psoriasis   . Slow transit constipation   . Stroke (cerebrum) ([place]) 02/01/2016  . Trigeminal neuralgia 02/20/2013    Past Surgical History:  Procedure Laterality Date  . APPENDECTOMY  1940  . bladder tack    . CATARACT EXTRACTION Bilateral   . HEMORRHOID SURGERY  1960  . HUMERUS FRACTURE SURGERY    . LAPAROSCOPIC HYSTERECTOMY  2006  . torn rotator cuff    . WRIST FRACTURE SURGERY  2005   seconday shoulder 2001    There were no vitals filed for this visit.      Subjective  Assessment - 10/22/16 1620    Subjective  Pt reports that she is working on her writing   Pertinent History adjustment disorder with depressed mood, diastolic CHF, osteoporosis, hx of fall, essential tremor, HTN, prediabetes, trigeminal neuralgia   Limitations fall risk   Patient Stated Goals be able to write, fold clothes, do dishes   Currently in Pain? No/denies      PROM to R hand wrist/finger ext for full stretch.   Sitting, mid-High level reaching with focus on normal movement patterns including extending elbow and wrist/hand for incr ease for grasp/release of cylinder objects with min difficulty/drops today (only with fatigue, improved with intermittent stretching).   Wt. Bearing through R hand on mat with body on arm movements/LUE functional reaching for decr tone.    Wt. Bearing in modified quadraped with hands over blocks with alternating UE functional reaching and set up for incr wt. Bearing and elbow ext during reach, min cues given.     Picking up checkers and turning in hand to flip with min difficulty.  Dealing cards with thumb with R hand with max difficulty/cues.                       OT Education - 10/22/16 1655    Education Details Updated Coordination HEP--see pt instructions   Person(s) Educated Patient;Spouse   Methods  Explanation;Verbal cues;Handout;Demonstration   Comprehension Verbalized understanding;Returned demonstration;Verbal cues required          OT Short Term Goals - 10/20/16 1138      OT SHORT TERM GOAL #1   Title -----------------     OT SHORT TERM GOAL #2   Title ----------------     OT SHORT TERM GOAL #3   Title --------------     OT SHORT TERM GOAL #4   Title -----------------------     OT SHORT TERM GOAL #5   Title Pt will be able to cut food mod I.--check updated STGs 10/28/16   Status Partially Met  08/24/16:  improved and is performing inconsistently;  09/28/16  continues to have difficulty.  10/20/16  pt able to cut  soft food and was able to return demo use of rocker knife for simulated meat cutting in clinic     OT SHORT TERM GOAL #6   Title Pt will be able to write 1-2 sentences with 100% legibility.   Status Revised  08/25/16  approx 75% legibility today.  09/24/16  approx 85% legibility.  10/20/16  1 sentence with approx 95% legibility, 2nd sentence with approx 85% legibility     OT SHORT TERM GOAL #7   Title Pt will be able to reach to retrieve cylinder objects with RUE from at least 75* shoulder flex with 80% accuracy.   Status Achieved  10/20/16  met with approx 80% accuracy in sitting           OT Long Term Goals - 10/20/16 1128      OT LONG TERM GOAL #1   Title --------------------------------     OT LONG TERM GOAL #2   Title ---------------------------     OT LONG TERM GOAL #3   Title ----------------------------------------     OT LONG TERM GOAL #4   Title Pt will improve functional reaching and grasp/release as shown by improving score on box and blocks to at least 25.--check updated LTGs 11/27/16  Revised 07/09/16:  as pt met initial goal of improving by 10   Baseline 3 blocks   Period Weeks   Status Achieved  07/09/16  Met, and revised goal;  07/30/16  17 blocks.  09/24/16  21 blocks.  10/20/16:  25 blocks     OT LONG TERM GOAL #5   Title -------------------------------     OT LONG TERM GOAL #7   Title Pt will demo improved coordination for ADLs as shown by completing 9-hole peg test in 41mn or less.   Period Weeks   Status Achieved  08/25/16:  placed 4 pegs in within 275m, completed in 1m71m37.79sec.  09/24/16:   2mi69m1.97sec.  10/20/16  1min66m.38sec     OT LONG TERM GOAL #8   Title Pt will be able to reach to retrieve cylinder objects with RUE from at least 85* shoulder flex with 80% accuracy.   Period Weeks   Status New     OT LONG TERM GOAL  #9   Baseline Pt will be able to manipulate 1 inch object in R hand with at least 75% accuracy.   Status On-going                Plan - 10/22/16 1631    Clinical Impression Statement Pt is progressing towards goals with improving coordination and functional reach.  Pt demo improved elbow ext with activity set-up to encourage/wt. bearing vs. functional reach.  Pt has difficulty coordinating full ext of  RUE.   Rehab Potential Good   OT Frequency 2x / week   OT Duration --  9 weeks   OT Treatment/Interventions Self-care/ADL training;Therapeutic exercise;DME and/or AE instruction;Therapist, nutritional;Therapeutic activities;Patient/family education;Balance training;Neuromuscular education;Electrical Stimulation;Moist Heat;Energy conservation;Passive range of motion;Therapeutic exercises;Splinting;Manual Therapy;Fluidtherapy;Ultrasound;Cryotherapy;Parrafin   Plan continue with RUE functional use, grasp/release and in-hand manipulation, neuro re-ed   OT Home Exercise Plan Education provided:  06/05/15 Initial HEP (table slides with light wt. bearing, tracing, and flipping cards); 06/22/16 supine ball ex and thumb opposition; 09/22/16  updates to HEP   Consulted and Agree with Plan of Care Patient   Family Member Consulted husband      Patient will benefit from skilled therapeutic intervention in order to improve the following deficits and impairments:  Decreased coordination, Decreased activity tolerance, Decreased knowledge of use of DME, Decreased strength, Impaired UE functional use, Impaired tone, Decreased range of motion, Decreased balance, Decreased mobility, Difficulty walking  Visit Diagnosis: Hemiplegia and hemiparesis following cerebral infarction affecting right dominant side (HCC)  Muscle weakness (generalized)  Unsteadiness on feet  Other lack of coordination  Other symptoms and signs involving the nervous system  Other abnormalities of gait and mobility    Problem List Patient Active Problem List   Diagnosis Date Noted  . Slow transit constipation   . Acute blood loss anemia   . Adjustment  disorder with depressed mood   . Cerebrovascular accident (CVA) ([place]) 02/03/2016  . Dysarthria, post-stroke   . Dysphagia, post-stroke   . Leukocytosis   . Prediabetes   . Right hemiparesis ([place])   . Cerebral infarction due to unspecified mechanism   . Other secondary hypertension   . Overactive bladder   . Diastolic dysfunction   . Hyperlipidemia   . Essential hypertension   . Essential tremor   . Stroke (cerebrum) ([place]) 02/01/2016  . Facial droop due to stroke 02/01/2016  . Essential and other specified forms of tremor 02/20/2013  . Dysphonia 02/20/2013  . Trigeminal neuralgia 02/20/2013    [facility] 10/22/2016, 4:56 PM  [facility] [address] [place], [place], [address] Phone: [phone]   Fax:  [phone]  Name: Kelly [name] MRN: [id number] Date of Birth: Aug 14, 1927   Vianne Bulls, OTR/L [facility] [address] [place], [place]  [address] [phone] phone [phone] 10/22/16 4:56 PM

## 2016-10-22 NOTE — Therapy (Signed)
[facility] [address] [place], [place], [address] Phone: [phone]   Fax:  [phone]  Physical Therapy Treatment  Patient Details  Name: Kelly [name] MRN: [id number] Date of Birth: 08-01-1927 Referring Provider: Cecille Rubin  Encounter Date: 10/22/2016      PT End of Session - 10/22/16 1522    Visit Number 36  G9 5/3 recert)   Number of Visits 52  recert 5/3 for 8 wks   Date for PT Re-Evaluation 11/20/16   Authorization Type Medicare/[employer] every 10th visit   Authorization Time Period 07/30/16- 09/25/16;   09/24/16 to 11/20/16   PT Start Time 1401   PT Stop Time 1444   PT Time Calculation (min) 43 min   Activity Tolerance Patient tolerated treatment well   Behavior During Therapy [facility] for tasks assessed/performed      Past Medical History:  Diagnosis Date  . Acute blood loss anemia   . Adjustment disorder with depressed mood   . CVA (cerebral vascular accident) ([place]) 02/03/2016   Linear infarct within the posterior limb of left internal capsule  . Diastolic CHF ([place])   . Dysphagia   . Dysphonia 02/20/2013  . Essential and other specified forms of tremor 02/20/2013  . HLD (hyperlipidemia)   . HTN (hypertension)   . OAB (overactive bladder)   . Osteoporosis   . Patient receiving subcutaneous heparin    For DVT prophylaxis 9/17  . Prediabetes   . Psoriasis   . Slow transit constipation   . Stroke (cerebrum) ([place]) 02/01/2016  . Trigeminal neuralgia 02/20/2013    Past Surgical History:  Procedure Laterality Date  . APPENDECTOMY  1940  . bladder tack    . CATARACT EXTRACTION Bilateral   . HEMORRHOID SURGERY  1960  . HUMERUS FRACTURE SURGERY    . LAPAROSCOPIC HYSTERECTOMY  2006  . torn rotator cuff    . WRIST FRACTURE SURGERY  2005   seconday shoulder 2001    There were no vitals filed for this visit.      Subjective Assessment - 10/22/16 1410    Subjective Nothing new to report.  Patient was noticing another patient's rollator and wondering if something like  that would work for her.    Patient is accompained by: Family member   Patient Stated Goals Pt's goal for therapy is to walk again.   Currently in Pain? No/denies                         [facility] Adult PT Treatment/Exercise - 10/22/16 1511      Bed Mobility   Bed Mobility Sit to Supine;Supine to Sit   Supine to Sit 6: Modified independent (Device/Increase time)   Sit to Supine 6: Modified independent (Device/Increase time)     Transfers   Transfers Sit to Stand;Stand to Sit   Sit to Stand 5: Supervision   Sit to Stand Details (indicate cue type and reason) no cues; supervision for safety only   Stand to Sit 5: Supervision     Ambulation/Gait   Ambulation/Gait Assistance 4: Min guard   Ambulation/Gait Assistance Details vc for upright posture and staying closer to her RW; also short distances in // bars with LUE only   Ambulation Distance (Feet) 60 Feet  60, 60, 60   Assistive device Rolling walker   Gait Pattern Step-through pattern;Decreased stride length;Decreased dorsiflexion - right;Right genu recurvatum;Poor foot clearance - right   Ambulation Surface Level;Indoor  Posture/Postural Control   Posture/Postural Control Postural limitations   Postural Limitations Rounded Shoulders;Forward head;Increased thoracic kyphosis   Posture Comments in supine vc and tactile cues for chin tuck and opening anterior chest to stretch; able to tolerate with 1 pillow     Knee/Hip Exercises: Standing   Hip Flexion AROM;Both;1 set;Knee bent   Hip Abduction Stengthening;Right;Left  walking sideways in // bars x 4 lengths   Lateral Step Up Right;1 set;10 reps;Hand Hold: 2;Step Height: 4"   Forward Step Up Both;1 set;20 reps;Hand Hold: 1;Step Height: 4"   Step Down Left;1 set;10 reps;Hand Hold: 2   Other Standing Knee Exercises backwards walking x 4 lengths // bars, 1 UE support                   PT Short Term Goals - 10/22/16 1519      PT SHORT TERM GOAL #1   Title ---     PT SHORT TERM GOAL #2   Title ---     PT SHORT TERM GOAL #4   Title ---     PT SHORT TERM GOAL #7   Title ----------------------------------------------------------------------------------------------------     PT SHORT TERM GOAL #8   Title Patient will demonstrate sit to stand 8 of 10 times with correct sequencing for safety with supervision but no cues. (TARGET for STGs 10-23-16)   Time 4   Period Weeks   Status Achieved     PT SHORT TERM GOAL #9   TITLE Patient will ascend/descend curb with RW with minguard assist (no physical assist, but close proximity) without verbal cues.    Baseline met 5/29   Time 4   Period Weeks   Status Achieved           PT Long Term Goals - 09/24/16 1451      PT LONG TERM GOAL #1   Title Pt/family will verbalize understanding of fall prevention in the home environment.  TARGET 07/31/16 (NEW TARGET DATE FOR ALL CURRENT LTGS 09/25/16)  5/4 MET   Time 8   Period Weeks   Status Achieved     PT LONG TERM GOAL #2   Title Pt will improve Berg Balance score to at least 31/56 for decreased fall risk.  09/24/16   32/56   Time 8   Period Weeks   Status Achieved     PT LONG TERM GOAL #3   Title Pt will improve TUG score to less than or equal to 45 seconds for decreased fall risk.     Baseline 33.41 sec 07/28/16  5/3 23.21 sec   Time 8   Period Weeks   Status Achieved     PT LONG TERM GOAL #4   Title Pt will improve gait velocity to at least 1.8 ft/sec for improved gait efficiency and safety.   Baseline 1.56 ft/sec with RW 07/28/16;  09/24/16  2.23 ft/sec   Time 8   Period Weeks   Status Achieved     PT LONG TERM GOAL #5   Title Pt will ambulate at least 800 ft indoor and outdoor surfaces, using RW, modified independently, for improved outdoor and community. (NEW TARGET date 11-20-16)   Baseline 5/2 did 600 ft   Time 8   Period Weeks   Status  On-going     Additional Long Term Goals   Additional Long Term Goals Yes     PT LONG TERM GOAL #6   Title Pt will improve Neuro QOL score on FOTO  by at least 20% for improved functional mobility. (NEW TARGET date 11-20-16)   Time 8   Period Weeks   Status On-going     PT LONG TERM GOAL #7   Title Patient will increase Berg score to 39/56 to indicate lesser fall risk. (TARGET date 11-20-16)   Time 8   Period Weeks   Status New     PT LONG TERM GOAL #8   Title Patient will increase her gait velocity to >=2.45 ft/sec indicative of increased safety with community ambulation. (TARGET date 11-20-16)   Time 8   Period Weeks   Status New               Plan - 10/22/16 1523    Clinical Impression Statement Patient met 2 of 2 short term goals (established 5/3 with re-certification). She continues to progress with Rt knee strength and improved control over longer distances. Today she was on her feet for 38 minutes without sitting to rest! Patient will continue to benefit from skilled PT to work towards [place].    Rehab Potential Good   PT Frequency 2x / week   PT Duration 8 weeks   PT Treatment/Interventions ADLs/Self Care Home Management;Functional mobility training;Stair training;Gait training;Patient/family education;Neuromuscular re-education;Balance training;Therapeutic exercise;Therapeutic activities;DME Instruction   PT Next Visit Plan continue to address posture in standing/sitting including chin tucks; gait distance (including outside); right LE strengthening/stretching; ? is her RW too high??   Consulted and Agree with Plan of Care Patient;Family member/caregiver   Family Member Consulted Husband      Patient will benefit from skilled therapeutic intervention in order to improve the following deficits and impairments:  Abnormal gait, Decreased balance, Decreased cognition, Decreased knowledge of use of DME, Decreased range of motion, Decreased safety awareness, Decreased  strength  Visit Diagnosis: Hemiplegia and hemiparesis following cerebral infarction affecting right dominant side (HCC)  Muscle weakness (generalized)  Unsteadiness on feet  Other abnormalities of gait and mobility     Problem List Patient Active Problem List   Diagnosis Date Noted  . Slow transit constipation   . Acute blood loss anemia   . Adjustment disorder with depressed mood   . Cerebrovascular accident (CVA) ([place]) 02/03/2016  . Dysarthria, post-stroke   . Dysphagia, post-stroke   . Leukocytosis   . Prediabetes   . Right hemiparesis ([place])   . Cerebral infarction due to unspecified mechanism   . Other secondary hypertension   . Overactive bladder   . Diastolic dysfunction   . Hyperlipidemia   . Essential hypertension   . Essential tremor   . Stroke (cerebrum) ([place]) 02/01/2016  . Facial droop due to stroke 02/01/2016  . Essential and other specified forms of tremor 02/20/2013  . Dysphonia 02/20/2013  . Trigeminal neuralgia 02/20/2013   Physical Therapy Progress Note  Dates of Reporting Period: 09/24/16 to 10/22/16    Objective Measurements: none completed at this STG assessment; 2 of 2 STG achieved  Goal Update: continue towards LTGs  Plan: 2x/week x 4 more weeks  Reason Skilled Services are Required: remains a fall risk due to impaired gait    Rexanne Mano, PT 10/22/2016, 3:27 PM  [facility] [address] [place], [place], [address] Phone: [phone]   Fax:  [phone]  Name: Kelly [name] MRN: [id number] Date of Birth: 1928/02/21

## 2016-10-22 NOTE — Patient Instructions (Signed)
  Coordination Activities  Perform the following activities with right hand(s).   Toss ball between hands.  Toss ball in air and catch with the same hand.  Toss bigger ball with both hands  Flip cards 1 at a time (Turning palm up).   Pick up coins, buttons, marbles, dried beans/pasta of different sizes and place in container.  Pick up coins and place in container or coin bank.  Pick up checkers and stack.   Pick up checkers one at a time until you get 3 in your hand, then move checker from palm to fingertips to put in bowl/container 1 at a time  Practice writing.

## 2016-10-26 ENCOUNTER — Ambulatory Visit: Payer: Medicare Other | Attending: Nurse Practitioner | Admitting: Occupational Therapy

## 2016-10-26 DIAGNOSIS — R2689 Other abnormalities of gait and mobility: Secondary | ICD-10-CM | POA: Diagnosis not present

## 2016-10-26 DIAGNOSIS — R29818 Other symptoms and signs involving the nervous system: Secondary | ICD-10-CM | POA: Diagnosis not present

## 2016-10-26 DIAGNOSIS — R2681 Unsteadiness on feet: Secondary | ICD-10-CM | POA: Insufficient documentation

## 2016-10-26 DIAGNOSIS — I69351 Hemiplegia and hemiparesis following cerebral infarction affecting right dominant side: Secondary | ICD-10-CM | POA: Insufficient documentation

## 2016-10-26 DIAGNOSIS — R278 Other lack of coordination: Secondary | ICD-10-CM | POA: Insufficient documentation

## 2016-10-26 DIAGNOSIS — M6281 Muscle weakness (generalized): Secondary | ICD-10-CM | POA: Insufficient documentation

## 2016-10-26 NOTE — Therapy (Signed)
[facility] [address] [place], [place], [address] Phone: [phone]   Fax:  [phone]  Occupational Therapy Treatment  Patient Details  Name: Kelly [name] MRN: [id number] Date of Birth: [birth year] Referring Provider: Cecille Rubin, NP  Encounter Date: 10/26/2016      OT End of Session - 10/26/16 1512    Visit Number 38   Number of Visits 46  30+16=46   Date for OT Re-Evaluation 11/27/16   Authorization Type Medicare / [employer] (g-code needed)   Authorization Time Period renewal completed 07/30/16; renewal completed 09/28/16-11/27/16   Authorization - Visit Number 81   Authorization - Number of Visits 40   OT Start Time 1403   OT Stop Time 1448   OT Time Calculation (min) 45 min   Activity Tolerance Patient tolerated treatment well   Behavior During Therapy [facility] for tasks assessed/performed      Past Medical History:  Diagnosis Date  . Acute blood loss anemia   . Adjustment disorder with depressed mood   . CVA (cerebral vascular accident) ([place]) 02/03/2016   Linear infarct within the posterior limb of left internal capsule  . Diastolic CHF ([place])   . Dysphagia   . Dysphonia 02/20/2013  . Essential and other specified forms of tremor 02/20/2013  . HLD (hyperlipidemia)   . HTN (hypertension)   . OAB (overactive bladder)   . Osteoporosis   . Patient receiving subcutaneous heparin    For DVT prophylaxis 9/17  . Prediabetes   . Psoriasis   . Slow transit constipation   . Stroke (cerebrum) ([place]) 02/01/2016  . Trigeminal neuralgia 02/20/2013    Past Surgical History:  Procedure Laterality Date  . APPENDECTOMY  1940  . bladder tack    . CATARACT EXTRACTION Bilateral   . HEMORRHOID SURGERY  1960  . HUMERUS FRACTURE SURGERY    . LAPAROSCOPIC HYSTERECTOMY  2006  . torn rotator cuff    . WRIST FRACTURE SURGERY  2005   seconday shoulder 2001    There were no vitals filed for this visit.      Subjective  Assessment - 10/26/16 1511    Subjective  Pt has been tossing ball.  MD appt later this week.  I still can't write.   Pertinent History adjustment disorder with depressed mood, diastolic CHF, osteoporosis, hx of fall, essential tremor, HTN, prediabetes, trigeminal neuralgia   Limitations fall risk   Patient Stated Goals be able to write, fold clothes, do dishes   Currently in Pain? No/denies       Manual:  Gentle joint mobs/stretch to R shoulder and wrist/fingers.  PROM elbow ext with shoulder in abduction and with full composite wrist/finger ext with supination for stretch.  Neuro re-ed: Wt. Bearing through R hand on mat with body on arm movements/LUE functional reaching for decr tone.    Wt. Bearing in modified quadraped with hands over blocks with alternating UE functional reaching and set up for incr wt. Bearing and elbow ext during reach, min cues given to place large pegs in pegboard (mod difficulty with in-hand manipulation/coordination RUE).  Using boomwhackers to isolate wrist movement with max cues/facilitation and for full RUE extension to target with mod difficulty/cues/facilitation.  Clapping for bilateral hand coordination and focus on sustaining R finger extension (mod cues/facilitation).  Self-care: In standing, side-stepping with wt. Light wt. Bearing through hands to assist with functional mobility for IADLs (countertop support).  Then placing dishes in top rack of dishwasher with min v.c./prompts  and was able to place one utensil in bottom rack with CGA to pull out rack and pull down door.  In standing, functional mid range functional reaching at countertop with wt.shift and trunk rotation to move objects on kitchen counter with min difficulty/cues.  (no drops).                         OT Short Term Goals - 10/20/16 1138      OT SHORT TERM GOAL #1   Title -----------------     OT SHORT TERM GOAL #2   Title ----------------     OT SHORT TERM GOAL  #3   Title --------------     OT SHORT TERM GOAL #4   Title -----------------------     OT SHORT TERM GOAL #5   Title Pt will be able to cut food mod I.--check updated STGs 10/28/16   Status Partially Met  08/24/16:  improved and is performing inconsistently;  09/28/16  continues to have difficulty.  10/20/16  pt able to cut soft food and was able to return demo use of rocker knife for simulated meat cutting in clinic     OT SHORT TERM GOAL #6   Title Pt will be able to write 1-2 sentences with 100% legibility.   Status Revised  08/25/16  approx 75% legibility today.  09/24/16  approx 85% legibility.  10/20/16  1 sentence with approx 95% legibility, 2nd sentence with approx 85% legibility     OT SHORT TERM GOAL #7   Title Pt will be able to reach to retrieve cylinder objects with RUE from at least 75* shoulder flex with 80% accuracy.   Status Achieved  10/20/16  met with approx 80% accuracy in sitting           OT Long Term Goals - 10/20/16 1128      OT LONG TERM GOAL #1   Title --------------------------------     OT LONG TERM GOAL #2   Title ---------------------------     OT LONG TERM GOAL #3   Title ----------------------------------------     OT LONG TERM GOAL #4   Title Pt will improve functional reaching and grasp/release as shown by improving score on box and blocks to at least 25.--check updated LTGs 11/27/16  Revised 07/09/16:  as pt met initial goal of improving by 10   Baseline 3 blocks   Period Weeks   Status Achieved  07/09/16  Met, and revised goal;  07/30/16  17 blocks.  09/24/16  21 blocks.  10/20/16:  25 blocks     OT LONG TERM GOAL #5   Title -------------------------------     OT LONG TERM GOAL #7   Title Pt will demo improved coordination for ADLs as shown by completing 9-hole peg test in 48mn or less.   Period Weeks   Status Achieved  08/25/16:  placed 4 pegs in within 247m, completed in 38m2m37.79sec.  09/24/16:   2mi66m1.97sec.  10/20/16  1min47m.38sec     OT LONG  TERM GOAL #8   Title Pt will be able to reach to retrieve cylinder objects with RUE from at least 85* shoulder flex with 80% accuracy.   Period Weeks   Status New     OT LONG TERM GOAL  #9   Baseline Pt will be able to manipulate 1 inch object in R hand with at least 75% accuracy.   Status On-going  Plan - 10/26/16 1513    Clinical Impression Statement Pt is progressing slowly towards goals for improving coordination and functional reach.  However, spasticity with full RUE movement/extension is a barrier as pt has difficulty coordinating and extending elbow, wrist, and fingers compositely for functional reaching.  Pt to discuss spasticity further with physician later this week.     Rehab Potential Good   OT Frequency 2x / week   OT Duration --  9 weeks   OT Treatment/Interventions Self-care/ADL training;Therapeutic exercise;DME and/or AE instruction;Therapist, nutritional;Therapeutic activities;Patient/family education;Balance training;Neuromuscular education;Electrical Stimulation;Moist Heat;Energy conservation;Passive range of motion;Therapeutic exercises;Splinting;Manual Therapy;Fluidtherapy;Ultrasound;Cryotherapy;Parrafin   Plan continue with RUE functional use, grasp/release, in-hand manipulation, neuro re-ed  (anticipate d/c in approx 3 weeks)   OT Home Exercise Plan Education provided:  06/05/15 Initial HEP (table slides with light wt. bearing, tracing, and flipping cards); 06/22/16 supine ball ex and thumb opposition; 09/22/16  updates to HEP   Consulted and Agree with Plan of Care Patient   Family Member Consulted husband      Patient will benefit from skilled therapeutic intervention in order to improve the following deficits and impairments:  Decreased coordination, Decreased activity tolerance, Decreased knowledge of use of DME, Decreased strength, Impaired UE functional use, Impaired tone, Decreased range of motion, Decreased balance, Decreased mobility,  Difficulty walking  Visit Diagnosis: Hemiplegia and hemiparesis following cerebral infarction affecting right dominant side (HCC)  Muscle weakness (generalized)  Unsteadiness on feet  Other abnormalities of gait and mobility  Other lack of coordination  Other symptoms and signs involving the nervous system    Problem List Patient Active Problem List   Diagnosis Date Noted  . Slow transit constipation   . Acute blood loss anemia   . Adjustment disorder with depressed mood   . Cerebrovascular accident (CVA) ([place]) 02/03/2016  . Dysarthria, post-stroke   . Dysphagia, post-stroke   . Leukocytosis   . Prediabetes   . Right hemiparesis ([place])   . Cerebral infarction due to unspecified mechanism   . Other secondary hypertension   . Overactive bladder   . Diastolic dysfunction   . Hyperlipidemia   . Essential hypertension   . Essential tremor   . Stroke (cerebrum) ([place]) 02/01/2016  . Facial droop due to stroke 02/01/2016  . Essential and other specified forms of tremor 02/20/2013  . Dysphonia 02/20/2013  . Trigeminal neuralgia 02/20/2013    [facility] 10/26/2016, 3:18 PM  [facility] [address] [place], [place], [address] Phone: [phone]   Fax:  [phone]  Name: Kelly [name] MRN: [id number] Date of Birth: [birth year]   Vianne Bulls, OTR/L [facility] [address] [place], [place]  [address] [phone] phone [phone] 10/26/16 3:18 PM

## 2016-10-27 ENCOUNTER — Ambulatory Visit: Payer: Medicare Other | Admitting: Physical Therapy

## 2016-10-27 ENCOUNTER — Ambulatory Visit: Payer: Medicare Other | Admitting: Occupational Therapy

## 2016-10-27 ENCOUNTER — Encounter: Payer: Self-pay | Admitting: Physical Therapy

## 2016-10-27 DIAGNOSIS — R2689 Other abnormalities of gait and mobility: Secondary | ICD-10-CM

## 2016-10-27 DIAGNOSIS — R29818 Other symptoms and signs involving the nervous system: Secondary | ICD-10-CM

## 2016-10-27 DIAGNOSIS — M6281 Muscle weakness (generalized): Secondary | ICD-10-CM

## 2016-10-27 DIAGNOSIS — R278 Other lack of coordination: Secondary | ICD-10-CM | POA: Diagnosis not present

## 2016-10-27 DIAGNOSIS — R2681 Unsteadiness on feet: Secondary | ICD-10-CM | POA: Diagnosis not present

## 2016-10-27 DIAGNOSIS — I69351 Hemiplegia and hemiparesis following cerebral infarction affecting right dominant side: Secondary | ICD-10-CM

## 2016-10-27 NOTE — Therapy (Signed)
[facility] [address] [place], [place], [address] Phone: [phone]   Fax:  [phone]  Physical Therapy Treatment  Patient Details  Name: Kelly [name] MRN: [id number] Date of Birth: July 15, 1927 Referring Provider: Cecille Rubin  Encounter Date: 10/27/2016      PT End of Session - 10/27/16 1957    Visit Number 37  G50 5/3 recert   Number of Visits 52  recert 5/3 for 8 wks   Date for PT Re-Evaluation 11/20/16   Authorization Type Medicare/[employer] every 10th visit   Authorization Time Period 07/30/16- 09/25/16;   09/24/16 to 11/20/16   PT Start Time 1014   PT Stop Time 1103   PT Time Calculation (min) 49 min   Activity Tolerance Patient tolerated treatment well   Behavior During Therapy [facility] for tasks assessed/performed      Past Medical History:  Diagnosis Date  . Acute blood loss anemia   . Adjustment disorder with depressed mood   . CVA (cerebral vascular accident) ([place]) 02/03/2016   Linear infarct within the posterior limb of left internal capsule  . Diastolic CHF ([place])   . Dysphagia   . Dysphonia 02/20/2013  . Essential and other specified forms of tremor 02/20/2013  . HLD (hyperlipidemia)   . HTN (hypertension)   . OAB (overactive bladder)   . Osteoporosis   . Patient receiving subcutaneous heparin    For DVT prophylaxis 9/17  . Prediabetes   . Psoriasis   . Slow transit constipation   . Stroke (cerebrum) ([place]) 02/01/2016  . Trigeminal neuralgia 02/20/2013    Past Surgical History:  Procedure Laterality Date  . APPENDECTOMY  1940  . bladder tack    . CATARACT EXTRACTION Bilateral   . HEMORRHOID SURGERY  1960  . HUMERUS FRACTURE SURGERY    . LAPAROSCOPIC HYSTERECTOMY  2006  . torn rotator cuff    . WRIST FRACTURE SURGERY  2005   seconday shoulder 2001    There were no vitals filed for this visit.      Subjective Assessment - 10/27/16 1010    Subjective Right first toe is more  sore again and she thinks it is changing the way she walks (very noticeable increased Rt knee genu curvatum).    Patient is accompained by: Family member   Patient Stated Goals Pt's goal for therapy is to walk again.   Currently in Pain? Yes   Pain Score 5    Pain Location Foot  first toe   Pain Orientation Right   Pain Descriptors / Indicators Throbbing   Pain Type Acute pain   Pain Onset Today   Pain Frequency Intermittent                         [facility] Adult PT Treatment/Exercise - 10/27/16 0001      Bed Mobility   Bed Mobility Sit to Supine;Supine to Sit   Supine to Sit 6: Modified independent (Device/Increase time)   Sit to Supine 6: Modified independent (Device/Increase time)     Transfers   Transfers Sit to Stand;Stand to Sit   Sit to Stand 5: Supervision   Sit to Stand Details (indicate cue type and reason) + vc for safe hand placement    Stand to Sit 5: Supervision   Stand to Sit Details + vc for safe hand placement      Ambulation/Gait   Ambulation/Gait Assistance 4: Min guard   Ambulation/Gait Assistance  Details pt noted to have significant increased Rt genu recurvatum and "limp" (apparently due to pain in Rt 1st toe; minimized ambulation today due to this; educated in shortening step length to minimize genu recurvatum   Ambulation Distance (Feet) 100 Feet   Assistive device Rolling walker   Gait Pattern Step-through pattern;Decreased stride length;Decreased dorsiflexion - right;Right genu recurvatum;Poor foot clearance - right   Ambulation Surface Level;Indoor     Exercises   Exercises Neck     Neck Exercises: Supine   Neck Retraction 10 reps;5 secs   Neck Retraction Limitations max vc and tactile cues to achieve correct motion with pt ultimately performing correctly without cues     Lumbar Exercises: Supine   Heel Slides 10 reps  RLE resisted flexion against red band   Bridge 10 reps;5 seconds  with abdct against red band around thighs    Other Supine Lumbar Exercises while supine for exercises, towel roll placed along spine for stretching anterior chest     Knee/Hip Exercises: Seated   Long Arc Quad Strengthening;Right;1 set;10 reps;Weights  4#     Knee/Hip Exercises: Supine   Bridges with Clamshell Strengthening  red band   Other Supine Knee/Hip Exercises hooklying unilateral hip abdct with red band at thighs x 10 each             Balance Exercises - 10/27/16 1951      Balance Exercises: Standing   Tandem Stance Eyes open;Upper extremity support 1;10 secs;2 reps  // bars   SLS Eyes open;Solid surface;Upper extremity support 1;3 reps;10 secs  // bars     OTAGO PROGRAM   Sideways Walking Assistive device           PT Education - 10/27/16 1956    Education provided Yes   Education Details proper technique for chin tuck (supine); additions to HEP; modifying gait while toe hurting   Person(s) Educated Patient;Spouse   Methods Explanation;Demonstration;Verbal cues;Handout   Comprehension Verbalized understanding;Returned demonstration;Verbal cues required;Need further instruction          PT Short Term Goals - 10/22/16 1519      PT SHORT TERM GOAL #1   Title ---     PT SHORT TERM GOAL #2   Title ---     PT SHORT TERM GOAL #4   Title ---     PT SHORT TERM GOAL #7   Title ----------------------------------------------------------------------------------------------------     PT SHORT TERM GOAL #8   Title Patient will demonstrate sit to stand 8 of 10 times with correct sequencing for safety with supervision but no cues. (TARGET for STGs 10-23-16)   Time 4   Period Weeks   Status Achieved     PT SHORT TERM GOAL #9   TITLE Patient will ascend/descend curb with RW with minguard assist (no physical assist, but close proximity) without verbal cues.    Baseline met 5/29   Time 4   Period Weeks   Status Achieved           PT Long Term Goals - 09/24/16 1451      PT LONG TERM GOAL #1    Title Pt/family will verbalize understanding of fall prevention in the home environment.  TARGET 07/31/16 (NEW TARGET DATE FOR ALL CURRENT LTGS 09/25/16)  5/4 MET   Time 8   Period Weeks   Status Achieved     PT LONG TERM GOAL #2   Title Pt will improve Berg Balance score to at least 31/56 for decreased  fall risk.  09/24/16   32/56   Time 8   Period Weeks   Status Achieved     PT LONG TERM GOAL #3   Title Pt will improve TUG score to less than or equal to 45 seconds for decreased fall risk.     Baseline 33.41 sec 07/28/16  5/3 23.21 sec   Time 8   Period Weeks   Status Achieved     PT LONG TERM GOAL #4   Title Pt will improve gait velocity to at least 1.8 ft/sec for improved gait efficiency and safety.   Baseline 1.56 ft/sec with RW 07/28/16;  09/24/16  2.23 ft/sec   Time 8   Period Weeks   Status Achieved     PT LONG TERM GOAL #5   Title Pt will ambulate at least 800 ft indoor and outdoor surfaces, using RW, modified independently, for improved outdoor and community. (NEW TARGET date 11-20-16)   Baseline 5/2 did 600 ft   Time 8   Period Weeks   Status On-going     Additional Long Term Goals   Additional Long Term Goals Yes     PT LONG TERM GOAL #6   Title Pt will improve Neuro QOL score on FOTO by at least 20% for improved functional mobility. (NEW TARGET date 11-20-16)   Time 8   Period Weeks   Status On-going     PT LONG TERM GOAL #7   Title Patient will increase Berg score to 39/56 to indicate lesser fall risk. (TARGET date 11-20-16)   Time 8   Period Weeks   Status New     PT LONG TERM GOAL #8   Title Patient will increase her gait velocity to >=2.45 ft/sec indicative of increased safety with community ambulation. (TARGET date 11-20-16)   Time 8   Period Weeks   Status New               Plan - 11-12-16 1998/07/19    Clinical Impression Statement Skilled session focused on LE strengthening and balance training due to toe pain limiting walking (and causing gait changes  with incr knee instability). Patient remains very motivated and asking for exercises done today in clinic be printed out so she can do them at home. Continue to work towards goals.    Rehab Potential Good   PT Frequency 2x / week   PT Duration 8 weeks   PT Treatment/Interventions ADLs/Self Care Home Management;Functional mobility training;Stair training;Gait training;Patient/family education;Neuromuscular re-education;Balance training;Therapeutic exercise;Therapeutic activities;DME Instruction   PT Next Visit Plan continue to address posture in standing/sitting including chin tucks; gait distance (including outside); right LE strengthening/stretching; ? is her RW too high??   Consulted and Agree with Plan of Care Patient;Family member/caregiver   Family Member Consulted Husband      Patient will benefit from skilled therapeutic intervention in order to improve the following deficits and impairments:  Abnormal gait, Decreased balance, Decreased cognition, Decreased knowledge of use of DME, Decreased range of motion, Decreased safety awareness, Decreased strength  Visit Diagnosis: Hemiplegia and hemiparesis following cerebral infarction affecting right dominant side (HCC)  Muscle weakness (generalized)  Unsteadiness on feet  Other abnormalities of gait and mobility       G-Codes - 2016-11-12 07/19/09    Functional Assessment Tool Used (Outpatient Only) 09-24-16 Merrilee Jansky 32/56   gait velocity 2.23 ft/sec   Functional Limitation Mobility: Walking and moving around   Mobility: Walking and Moving Around Current Status (M2500) At least 20  percent but less than 40 percent impaired, limited or restricted   Mobility: Walking and Moving Around Goal Status [phone]) At least 20 percent but less than 40 percent impaired, limited or restricted      Problem List Patient Active Problem List   Diagnosis Date Noted  . Slow transit constipation   . Acute blood loss anemia   . Adjustment disorder with depressed  mood   . Cerebrovascular accident (CVA) ([place]) 02/03/2016  . Dysarthria, post-stroke   . Dysphagia, post-stroke   . Leukocytosis   . Prediabetes   . Right hemiparesis ([place])   . Cerebral infarction due to unspecified mechanism   . Other secondary hypertension   . Overactive bladder   . Diastolic dysfunction   . Hyperlipidemia   . Essential hypertension   . Essential tremor   . Stroke (cerebrum) ([place]) 02/01/2016  . Facial droop due to stroke 02/01/2016  . Essential and other specified forms of tremor 02/20/2013  . Dysphonia 02/20/2013  . Trigeminal neuralgia 02/20/2013   Physical Therapy Progress Note  Dates of Reporting Period: 09/24/16 to 10/27/16  Objective Reports of Subjective Statement: Feels she is getting stronger and tolerating more activity; was able to complete 38 minutes on her feet without a seated rest.  Objective Measurements: see above  Goal Update: see 5/3  Plan: Continue with current POC  Reason Skilled Services are Required: Remains a fall risk with Berg score <45/56.    Rexanne Mano, PT 10/27/2016, 8:12 PM  [facility] [address] [place], [place], [address] Phone: [phone]   Fax:  [phone]  Name: Kelly [name] MRN: [id number] Date of Birth: June 29, 1927

## 2016-10-27 NOTE — Therapy (Signed)
[facility] [address] [place], [place], [address] Phone: [phone]   Fax:  [phone]  Occupational Therapy Treatment  Patient Details  Name: Kelly [name] MRN: [id number] Date of Birth: 11/23/1927 Referring Provider: Cecille Rubin, NP  Encounter Date: 10/27/2016      OT End of Session - 10/27/16 1102    Visit Number 39   Number of Visits 46  30+16=46   Date for OT Re-Evaluation 11/27/16   Authorization Type Medicare / [employer] (g-code needed)   Authorization Time Period renewal completed 07/30/16; renewal completed 09/28/16-11/27/16   Authorization - Visit Number 61   Authorization - Number of Visits 40   OT Start Time 1404   OT Stop Time 1445   OT Time Calculation (min) 41 min   Activity Tolerance Patient tolerated treatment well   Behavior During Therapy [facility] for tasks assessed/performed      Past Medical History:  Diagnosis Date  . Acute blood loss anemia   . Adjustment disorder with depressed mood   . CVA (cerebral vascular accident) ([place]) 02/03/2016   Linear infarct within the posterior limb of left internal capsule  . Diastolic CHF ([place])   . Dysphagia   . Dysphonia 02/20/2013  . Essential and other specified forms of tremor 02/20/2013  . HLD (hyperlipidemia)   . HTN (hypertension)   . OAB (overactive bladder)   . Osteoporosis   . Patient receiving subcutaneous heparin    For DVT prophylaxis 9/17  . Prediabetes   . Psoriasis   . Slow transit constipation   . Stroke (cerebrum) ([place]) 02/01/2016  . Trigeminal neuralgia 02/20/2013    Past Surgical History:  Procedure Laterality Date  . APPENDECTOMY  1940  . bladder tack    . CATARACT EXTRACTION Bilateral   . HEMORRHOID SURGERY  1960  . HUMERUS FRACTURE SURGERY    . LAPAROSCOPIC HYSTERECTOMY  2006  . torn rotator cuff    . WRIST FRACTURE SURGERY  2005   seconday shoulder 2001    There were no vitals filed for this visit.      Subjective  Assessment - 10/27/16 1102    Subjective  Pt has been tossing ball.  MD appt later this week.  I still can't write.   Pertinent History adjustment disorder with depressed mood, diastolic CHF, osteoporosis, hx of fall, essential tremor, HTN, prediabetes, trigeminal neuralgia   Limitations fall risk   Patient Stated Goals be able to write, fold clothes, do dishes   Currently in Pain? No/denies      Manual:  Gentle joint mobs/stretch to R shoulder and wrist/fingers, particularly in composite ext.      Wt. Bearing in modified quadraped with hands over blocks with forward/backward wt. Shifts for decr tone and incr tricep ext and full composite wrist/finger ext.  Using boomwhackers to isolate wrist movement with mod cues/facilitation and for full RUE extension to target with mod difficulty/cues/facilitation.  Clapping for bilateral hand coordination and focus on sustaining R finger extension (min cues/facilitation).  Tossing/catching medium ball with BUEs with min difficulty to encourage wrist/finger and elbow ext.  Then tossing small ball with RUE with mod cues/difficulty.  Attempting to manipulate checkers in R hand with max difficulty/cues.  Manipulating small cylinder object in R hand with max difficulty/cues.                           OT Short Term Goals - 10/20/16 1138  OT SHORT TERM GOAL #1   Title -----------------     OT SHORT TERM GOAL #2   Title ----------------     OT SHORT TERM GOAL #3   Title --------------     OT SHORT TERM GOAL #4   Title -----------------------     OT SHORT TERM GOAL #5   Title Pt will be able to cut food mod I.--check updated STGs 10/28/16   Status Partially Met  08/24/16:  improved and is performing inconsistently;  09/28/16  continues to have difficulty.  10/20/16  pt able to cut soft food and was able to return demo use of rocker knife for simulated meat cutting in clinic     OT SHORT TERM GOAL #6   Title Pt will be able to  write 1-2 sentences with 100% legibility.   Status Revised  08/25/16  approx 75% legibility today.  09/24/16  approx 85% legibility.  10/20/16  1 sentence with approx 95% legibility, 2nd sentence with approx 85% legibility     OT SHORT TERM GOAL #7   Title Pt will be able to reach to retrieve cylinder objects with RUE from at least 75* shoulder flex with 80% accuracy.   Status Achieved  10/20/16  met with approx 80% accuracy in sitting           OT Long Term Goals - 10/20/16 1128      OT LONG TERM GOAL #1   Title --------------------------------     OT LONG TERM GOAL #2   Title ---------------------------     OT LONG TERM GOAL #3   Title ----------------------------------------     OT LONG TERM GOAL #4   Title Pt will improve functional reaching and grasp/release as shown by improving score on box and blocks to at least 25.--check updated LTGs 11/27/16  Revised 07/09/16:  as pt met initial goal of improving by 10   Baseline 3 blocks   Period Weeks   Status Achieved  07/09/16  Met, and revised goal;  07/30/16  17 blocks.  09/24/16  21 blocks.  10/20/16:  25 blocks     OT LONG TERM GOAL #5   Title -------------------------------     OT LONG TERM GOAL #7   Title Pt will demo improved coordination for ADLs as shown by completing 9-hole peg test in 235mn or less.   Period Weeks   Status Achieved  08/25/16:  placed 4 pegs in within 278m, completed in 35m62m37.79sec.  09/24/16:   2mi29m1.97sec.  10/20/16  1min17m.38sec     OT LONG TERM GOAL #8   Title Pt will be able to reach to retrieve cylinder objects with RUE from at least 85* shoulder flex with 80% accuracy.   Period Weeks   Status New     OT LONG TERM GOAL  #9   Baseline Pt will be able to manipulate 1 inch object in R hand with at least 75% accuracy.   Status On-going               Plan - 10/27/16 1103    Clinical Impression Statement Pt demo improved gross finger extension for grasp/release of objects.  However, spasticity and  difficulty coordinating full composite ext of elbow, wrist, and fingers for functional reach (with grasp) remains difficult.   Pt also demo difficulty with inividual finger movements which is a barrier.     Rehab Potential Good   OT Frequency 2x / week   OT Duration --  9 weeks   OT Treatment/Interventions Self-care/ADL training;Therapeutic exercise;DME and/or AE instruction;Therapist, nutritional;Therapeutic activities;Patient/family education;Balance training;Neuromuscular education;Electrical Stimulation;Moist Heat;Energy conservation;Passive range of motion;Therapeutic exercises;Splinting;Manual Therapy;Fluidtherapy;Ultrasound;Cryotherapy;Parrafin   Plan continue with RUE functional use, grasp/release, in-hand manipulation, neuro re-ed   OT Home Exercise Plan Education provided:  06/05/15 Initial HEP (table slides with light wt. bearing, tracing, and flipping cards); 06/22/16 supine ball ex and thumb opposition; 09/22/16  updates to HEP   Consulted and Agree with Plan of Care Patient   Family Member Consulted husband      Patient will benefit from skilled therapeutic intervention in order to improve the following deficits and impairments:  Decreased coordination, Decreased activity tolerance, Decreased knowledge of use of DME, Decreased strength, Impaired UE functional use, Impaired tone, Decreased range of motion, Decreased balance, Decreased mobility, Difficulty walking  Visit Diagnosis: Hemiplegia and hemiparesis following cerebral infarction affecting right dominant side (HCC)  Muscle weakness (generalized)  Unsteadiness on feet  Other abnormalities of gait and mobility  Other lack of coordination  Other symptoms and signs involving the nervous system    Problem List Patient Active Problem List   Diagnosis Date Noted  . Slow transit constipation   . Acute blood loss anemia   . Adjustment disorder with depressed mood   . Cerebrovascular accident (CVA) ([place]) 02/03/2016  .  Dysarthria, post-stroke   . Dysphagia, post-stroke   . Leukocytosis   . Prediabetes   . Right hemiparesis ([place])   . Cerebral infarction due to unspecified mechanism   . Other secondary hypertension   . Overactive bladder   . Diastolic dysfunction   . Hyperlipidemia   . Essential hypertension   . Essential tremor   . Stroke (cerebrum) ([place]) 02/01/2016  . Facial droop due to stroke 02/01/2016  . Essential and other specified forms of tremor 02/20/2013  . Dysphonia 02/20/2013  . Trigeminal neuralgia 02/20/2013    [facility] 10/27/2016, 4:31 PM  [facility] [address] [place], [place], [address] Phone: [phone]   Fax:  [phone]  Name: ANGELEENA DUEITT MRN: [id number] Date of Birth: 11/03/1927   Vianne Bulls, OTR/L [facility] [address] [place], [place]  [address] [phone] phone [phone] 10/27/16 4:31 PM

## 2016-10-27 NOTE — Patient Instructions (Signed)
Mini-Squats (Standing)    Stand with support. Bend knees slightly. Walk sideways along pool table with knees bent. Return other direction. Go around table once to the left and once to the right. Do _2__ times a day.  Copyright  VHI. All rights reserved.   Abductor Strength: Bridge Pose (Strap)    Place band above your knees at hip width. Lift your hips, Press knees apart, bring knees together and then lower hips down.  Repeat ___10_ times.  Copyright  VHI. All rights reserved.

## 2016-10-28 ENCOUNTER — Encounter: Payer: Self-pay | Admitting: Physical Therapy

## 2016-10-28 ENCOUNTER — Ambulatory Visit: Payer: Medicare Other | Admitting: Physical Therapy

## 2016-10-28 DIAGNOSIS — R2681 Unsteadiness on feet: Secondary | ICD-10-CM | POA: Diagnosis not present

## 2016-10-28 DIAGNOSIS — I69351 Hemiplegia and hemiparesis following cerebral infarction affecting right dominant side: Secondary | ICD-10-CM

## 2016-10-28 DIAGNOSIS — R2689 Other abnormalities of gait and mobility: Secondary | ICD-10-CM

## 2016-10-28 DIAGNOSIS — R278 Other lack of coordination: Secondary | ICD-10-CM | POA: Diagnosis not present

## 2016-10-28 DIAGNOSIS — R29818 Other symptoms and signs involving the nervous system: Secondary | ICD-10-CM | POA: Diagnosis not present

## 2016-10-28 DIAGNOSIS — M6281 Muscle weakness (generalized): Secondary | ICD-10-CM

## 2016-10-29 ENCOUNTER — Encounter: Payer: Self-pay | Admitting: Physical Medicine & Rehabilitation

## 2016-10-29 ENCOUNTER — Encounter: Payer: Medicare Other | Attending: Physical Medicine & Rehabilitation | Admitting: Physical Medicine & Rehabilitation

## 2016-10-29 VITALS — BP 118/68 | HR 64

## 2016-10-29 DIAGNOSIS — I639 Cerebral infarction, unspecified: Secondary | ICD-10-CM

## 2016-10-29 DIAGNOSIS — R7303 Prediabetes: Secondary | ICD-10-CM | POA: Diagnosis not present

## 2016-10-29 DIAGNOSIS — IMO0002 Reserved for concepts with insufficient information to code with codable children: Secondary | ICD-10-CM

## 2016-10-29 DIAGNOSIS — G8111 Spastic hemiplegia affecting right dominant side: Secondary | ICD-10-CM

## 2016-10-29 DIAGNOSIS — N3281 Overactive bladder: Secondary | ICD-10-CM | POA: Diagnosis not present

## 2016-10-29 DIAGNOSIS — F4321 Adjustment disorder with depressed mood: Secondary | ICD-10-CM | POA: Insufficient documentation

## 2016-10-29 DIAGNOSIS — I69351 Hemiplegia and hemiparesis following cerebral infarction affecting right dominant side: Secondary | ICD-10-CM | POA: Diagnosis not present

## 2016-10-29 DIAGNOSIS — E785 Hyperlipidemia, unspecified: Secondary | ICD-10-CM | POA: Diagnosis not present

## 2016-10-29 DIAGNOSIS — M810 Age-related osteoporosis without current pathological fracture: Secondary | ICD-10-CM | POA: Diagnosis not present

## 2016-10-29 DIAGNOSIS — I5030 Unspecified diastolic (congestive) heart failure: Secondary | ICD-10-CM | POA: Diagnosis not present

## 2016-10-29 DIAGNOSIS — R269 Unspecified abnormalities of gait and mobility: Secondary | ICD-10-CM

## 2016-10-29 DIAGNOSIS — G250 Essential tremor: Secondary | ICD-10-CM | POA: Diagnosis not present

## 2016-10-29 DIAGNOSIS — L409 Psoriasis, unspecified: Secondary | ICD-10-CM | POA: Insufficient documentation

## 2016-10-29 DIAGNOSIS — I110 Hypertensive heart disease with heart failure: Secondary | ICD-10-CM | POA: Diagnosis not present

## 2016-10-29 NOTE — Progress Notes (Addendum)
Subjective:    Patient ID: Kelly [name], female    DOB: 1927-06-01, 83 y.o.   MRN: [id number]  HPI 83 year old right-handed female with history of essential tremor presents for follow up for infarct within the posterior limb of the left internal capsule.  Last clinic visit 09/17/16.  She had a Botox injection at that time. Presents with husband who provides majority of the history. She had:  Right Biceps 30U Right FDS 50U Right FCR 20U Right FDP 20U She notes improvement with spasticity, but wants improvement in [facility].   Pain Inventory Average Pain 5 Pain Right Now 5 My pain is constant and aching  In the last 24 hours, has pain interfered with the following? General activity 0 Relation with others 0 Enjoyment of life 0 What TIME of day is your pain at its worst? varies with activity Sleep (in general) Good  Pain is worse with: walking, standing and some activites Pain improves with: rest Relief from Meds: no meds  Mobility walk with assistance use a walker ability to climb steps?  no do you drive?  no use a wheelchair  Function disabled: date disabled 02/01/2016 I need assistance with the following:  bathing, meal prep, household duties and shopping  Neuro/Psych tremor trouble walking  Prior Studies Any changes since last visit?  no  Physicians involved in your care Any changes since last visit?  no   Family History  Problem Relation Age of Onset  . Arthritis Brother        spine  . Osteoporosis Maternal Grandmother    Social History   Social History  . Marital status: Married    Spouse name: N/A  . Number of children: 3  . Years of education: college   Occupational History  . real estate     retired   Social History Main Topics  . Smoking status: Never Smoker  . Smokeless tobacco: Never Used  . Alcohol use Yes     Comment: Wine 2 glass daily  . Drug use: No  . Sexual activity: Not Asked   Other Topics Concern  . None   Social History  Narrative   Lives at home with her husband.   Right-handed.   No caffeine use.   Past Surgical History:  Procedure Laterality Date  . APPENDECTOMY  1940  . bladder tack    . CATARACT EXTRACTION Bilateral   . HEMORRHOID SURGERY  1960  . HUMERUS FRACTURE SURGERY    . LAPAROSCOPIC HYSTERECTOMY  2006  . torn rotator cuff    . WRIST FRACTURE SURGERY  2005   seconday shoulder 2001   Past Medical History:  Diagnosis Date  . Acute blood loss anemia   . Adjustment disorder with depressed mood   . CVA (cerebral vascular accident) (HCC) 02/03/2016   Linear infarct within the posterior limb of left internal capsule  . Diastolic CHF (HCC)   . Dysphagia   . Dysphonia 02/20/2013  . Essential and other specified forms of tremor 02/20/2013  . HLD (hyperlipidemia)   . HTN (hypertension)   . OAB (overactive bladder)   . Osteoporosis   . Patient receiving subcutaneous heparin    For DVT prophylaxis 9/17  . Prediabetes   . Psoriasis   . Slow transit constipation   . Stroke (cerebrum) (HCC) 02/01/2016  . Trigeminal neuralgia 02/20/2013   BP 118/68   Pulse 64   SpO2 93%   Opioid Risk Score:   Fall Risk Score:  `1  Depression screen PHQ 2/9  Depression screen PHQ 2/9 04/24/2016  Decreased Interest 0  Down, Depressed, Hopeless 0  PHQ - 2 Score 0  Altered sleeping 0  Tired, decreased energy 0  Change in appetite 0  Feeling bad or failure about yourself  0  Trouble concentrating 0  Moving slowly or fidgety/restless 0  Suicidal thoughts 0  PHQ-9 Score 0    Review of Systems  Constitutional: Negative.   HENT: Negative.   Eyes: Negative.   Respiratory: Negative.   Cardiovascular: Negative.   Gastrointestinal: Negative.   Endocrine: Negative.   Genitourinary: Negative.   Musculoskeletal: Positive for arthralgias and gait problem.  Skin: Negative.   Allergic/Immunologic: Negative.   Neurological: Positive for tremors.  Hematological: Bruises/bleeds easily.    Psychiatric/Behavioral: Negative.   All other systems reviewed and are negative.     Objective:   Physical Exam Constitutional: She appears well-developed. Frail. NAD.  HENT: Normocephalic and atraumatic.  Eyes: EOMI. No discharge..  Cardiovascular: RRR.  No JVD. Respiratory: Effort normal and breath sounds normal.  GI: Soft. Bowel sounds are normal.  Musculoskeletal: She exhibits no edema or tenderness.  Gait: Slow cadence with kyphotic posture Neurological: She is alert and oriented.  HOH Mild dysarthria.  Right facial droop +LUE Resting tremor Motor: RUE: shoulder abduction, elbow flexion/extension 4/5, wrist 4/5, hand 4/5 RLE: 4+/5 hip flexion, knee extension, ankle dorsi/plantar flexion MAS: right elbow flexors 1/4, wrist flexors 1/4, finger flexors 1/4.  LUE/LLE: 5/5 proximal to distal  Psychiatric: She has a normal mood and affect. Her behavior is normal Skin: Skin is warm and dry    Assessment & Plan:  83 year old right-handed female with history of essential tremor presents for follow for infarct within the posterior limb of the left internal capsule.  1. Late effects infarct within the posterior limb of the left internal capsule with spastic hemiplegia affecting right dominant side (G81.11).  On last visit:   Right Biceps 30U   Right FDS 50U   Right FCR 20U   Right FDP 20U   Will cont to current dose.    Cont therapies  2. Gait abnormality  Cont therapies  Cont walker for safety

## 2016-10-29 NOTE — Therapy (Signed)
[facility] [address] [place], [place], [address] Phone: [phone]   Fax:  [phone]  Physical Therapy Treatment  Patient Details  Name: Kelly [name] MRN: [id number] Date of Birth: 12/26/1927 Referring Provider: Martin, Carolyn  Encounter Date: 10/28/2016      PT End of Session - 10/28/16 1411    Visit Number 38  G1 5/3 recert   Number of Visits 52  recert 5/3 for 8 wks   Date for PT Re-Evaluation 11/20/16   Authorization Type Medicare/[place] [facility] every 10th visit   Authorization Time Period 07/30/16- 09/25/16;   09/24/16 to 11/20/16   PT Start Time 1405   PT Stop Time 1445   PT Time Calculation (min) 40 min   Equipment Utilized During Treatment Gait belt   Activity Tolerance Patient tolerated treatment well   Behavior During Therapy WFL for tasks assessed/performed      Past Medical History:  Diagnosis Date  . Acute blood loss anemia   . Adjustment disorder with depressed mood   . CVA (cerebral vascular accident) (HCC) 02/03/2016   Linear infarct within the posterior limb of left internal capsule  . Diastolic CHF (HCC)   . Dysphagia   . Dysphonia 02/20/2013  . Essential and other specified forms of tremor 02/20/2013  . HLD (hyperlipidemia)   . HTN (hypertension)   . OAB (overactive bladder)   . Osteoporosis   . Patient receiving subcutaneous heparin    For DVT prophylaxis 9/17  . Prediabetes   . Psoriasis   . Slow transit constipation   . Stroke (cerebrum) (HCC) 02/01/2016  . Trigeminal neuralgia 02/20/2013    Past Surgical History:  Procedure Laterality Date  . APPENDECTOMY  1940  . bladder tack    . CATARACT EXTRACTION Bilateral   . HEMORRHOID SURGERY  1960  . HUMERUS FRACTURE SURGERY    . LAPAROSCOPIC HYSTERECTOMY  2006  . torn rotator cuff    . WRIST FRACTURE SURGERY  2005   seconday shoulder 2001    There were no vitals filed for this visit.      Subjective Assessment -  10/28/16 1409    Subjective Toe feels better today, however left hip/leg is sore today. Pt feels it's sore due to the exercises she did at last session. Has not done them at home due to this and busy schedule.   Patient is accompained by: Family member   Patient Stated Goals Pt's goal for therapy is to walk again.   Currently in Pain? Yes   Pain Score 5    Pain Location Leg   Pain Orientation Left   Pain Descriptors / Indicators Aching;Sore   Pain Type Acute pain   Pain Onset In the past 7 days   Pain Frequency Intermittent   Aggravating Factors  movements   Pain Relieving Factors rest            [facility] Adult PT Treatment/Exercise - 10/28/16 1414      Transfers   Transfers Sit to Stand;Stand to Sit   Sit to Stand 5: Supervision;With upper extremity assist;From bed   Stand to Sit 5: Supervision;With upper extremity assist;To bed     Ambulation/Gait   Ambulation/Gait Yes   Ambulation/Gait Assistance 4: Min guard;4: Min assist   Ambulation/Gait Assistance Details cues on step length, foot clearance and assist at times with walker on uneven pavement   Ambulation Distance (Feet) 250 Feet   Assistive device Rolling walker   Gait Pattern Step-through   pattern;Decreased stride length;Decreased dorsiflexion - right;Right genu recurvatum;Poor foot clearance - right   Ambulation Surface Level;Unlevel;Indoor;Outdoor;Paved   Curb Other (comment)  min guard assist with RW   Curb Details (indicate cue type and reason) x1 with outdoor curb     Knee/Hip Exercises: Standing   Terminal Knee Extension Strengthening;Right;1 set;10 reps;Theraband;Limitations   Theraband Level (Terminal Knee Extension) Level 2 (Red)   Terminal Knee Extension Limitations with 5 sec holds each rep; assist/stability at hip to prevent compensatory movements   Forward Step Up Right;2 sets;10 reps;Hand Hold: 2;Step Height: 6";Limitations   Forward Step Up Limitations using bottom step with light UE support on rails,  cues for right knee control with lifting up (manual assist/tactile cues to prevent recurvatum)                      Knee/Hip Exercises: Seated   Long Arc Quad AROM;Strengthening;Right;2 sets;10 reps;Weights;Limitations   Long Arc Quad Weight 4 lbs.   Long Arc Quad Limitations 3 sec hold with each rep, cues on form/technique   Hamstring Curl AROM;Strengthening;Right;1 set;10 reps;Limitations   Hamstring Limitations with red band- 5 sec holds before releasing and bringing foot back up            PT Short Term Goals - 10/22/16 1519      PT SHORT TERM GOAL #1   Title ---     PT SHORT TERM GOAL #2   Title ---     PT SHORT TERM GOAL #4   Title ---     PT SHORT TERM GOAL #7   Title ----------------------------------------------------------------------------------------------------     PT SHORT TERM GOAL #8   Title Patient will demonstrate sit to stand 8 of 10 times with correct sequencing for safety with supervision but no cues. (TARGET for STGs 10-23-16)   Time 4   Period Weeks   Status Achieved     PT SHORT TERM GOAL #9   TITLE Patient will ascend/descend curb with RW with minguard assist (no physical assist, but close proximity) without verbal cues.    Baseline met 5/29   Time 4   Period Weeks   Status Achieved           PT Long Term Goals - 09/24/16 1451      PT LONG TERM GOAL #1   Title Pt/family will verbalize understanding of fall prevention in the home environment.  TARGET 07/31/16 (NEW TARGET DATE FOR ALL CURRENT LTGS 09/25/16)  5/4 MET   Time 8   Period Weeks   Status Achieved     PT LONG TERM GOAL #2   Title Pt will improve Berg Balance score to at least 31/56 for decreased fall risk.  09/24/16   32/56   Time 8   Period Weeks   Status Achieved     PT LONG TERM GOAL #3   Title Pt will improve TUG score to less than or equal to 45 seconds for decreased fall risk.     Baseline 33.41 sec 07/28/16  5/3 23.21 sec   Time 8   Period Weeks   Status Achieved     PT  LONG TERM GOAL #4   Title Pt will improve gait velocity to at least 1.8 ft/sec for improved gait efficiency and safety.   Baseline 1.56 ft/sec with RW 07/28/16;  09/24/16  2.23 ft/sec   Time 8   Period Weeks   Status Achieved     PT LONG TERM GOAL #5     Title Pt will ambulate at least 800 ft indoor and outdoor surfaces, using RW, modified independently, for improved outdoor and community. (NEW TARGET date 11-20-16)   Baseline 5/2 did 600 ft   Time 8   Period Weeks   Status On-going     Additional Long Term Goals   Additional Long Term Goals Yes     PT LONG TERM GOAL #6   Title Pt will improve Neuro QOL score on FOTO by at least 20% for improved functional mobility. (NEW TARGET date 11-20-16)   Time 8   Period Weeks   Status On-going     PT LONG TERM GOAL #7   Title Patient will increase Berg score to 39/56 to indicate lesser fall risk. (TARGET date 11-20-16)   Time 8   Period Weeks   Status New     PT LONG TERM GOAL #8   Title Patient will increase her gait velocity to >=2.45 ft/sec indicative of increased safety with community ambulation. (TARGET date 11-20-16)   Time 8   Period Weeks   Status New           Plan - 10/28/16 1412    Clinical Impression Statement Today's skilled session continued to address gait on various surfaces and on LE strengthening. Pt is making steady progress toward goals and should benefit from continued PT to progress toward unmet goals.    Rehab Potential Good   PT Frequency 2x / week   PT Duration 8 weeks   PT Treatment/Interventions ADLs/Self Care Home Management;Functional mobility training;Stair training;Gait training;Patient/family education;Neuromuscular re-education;Balance training;Therapeutic exercise;Therapeutic activities;DME Instruction   PT Next Visit Plan check RW height- sorry if forgot too!; continue to address posture in standing/sitting including chin tucks; gait distance (including outside); right LE strengthening/stretching    Consulted and Agree with Plan of Care Patient;Family member/caregiver   Family Member Consulted Husband      Patient will benefit from skilled therapeutic intervention in order to improve the following deficits and impairments:  Abnormal gait, Decreased balance, Decreased cognition, Decreased knowledge of use of DME, Decreased range of motion, Decreased safety awareness, Decreased strength  Visit Diagnosis: Hemiplegia and hemiparesis following cerebral infarction affecting right dominant side (HCC)  Muscle weakness (generalized)  Unsteadiness on feet  Other abnormalities of gait and mobility     Problem List Patient Active Problem List   Diagnosis Date Noted  . Slow transit constipation   . Acute blood loss anemia   . Adjustment disorder with depressed mood   . Cerebrovascular accident (CVA) ([place]) 02/03/2016  . Dysarthria, post-stroke   . Dysphagia, post-stroke   . Leukocytosis   . Prediabetes   . Right hemiparesis ([place])   . Cerebral infarction due to unspecified mechanism   . Other secondary hypertension   . Overactive bladder   . Diastolic dysfunction   . Hyperlipidemia   . Essential hypertension   . Essential tremor   . Stroke (cerebrum) ([place]) 02/01/2016  . Facial droop due to stroke 02/01/2016  . Essential and other specified forms of tremor 02/20/2013  . Dysphonia 02/20/2013  . Trigeminal neuralgia 02/20/2013    Willow Ora, PTA, [facility] [address] [place], [place] [address] [phone] 10/29/16, 5:12 PM   Name: JESSIECA RHEM MRN: [id number] Date of Birth: [birth year]

## 2016-11-02 ENCOUNTER — Ambulatory Visit: Payer: Medicare Other | Admitting: Physical Therapy

## 2016-11-02 ENCOUNTER — Ambulatory Visit: Payer: Medicare Other | Admitting: Occupational Therapy

## 2016-11-02 DIAGNOSIS — M6281 Muscle weakness (generalized): Secondary | ICD-10-CM

## 2016-11-02 DIAGNOSIS — I69351 Hemiplegia and hemiparesis following cerebral infarction affecting right dominant side: Secondary | ICD-10-CM

## 2016-11-02 DIAGNOSIS — R2681 Unsteadiness on feet: Secondary | ICD-10-CM | POA: Diagnosis not present

## 2016-11-02 DIAGNOSIS — R29818 Other symptoms and signs involving the nervous system: Secondary | ICD-10-CM

## 2016-11-02 DIAGNOSIS — R2689 Other abnormalities of gait and mobility: Secondary | ICD-10-CM | POA: Diagnosis not present

## 2016-11-02 DIAGNOSIS — R278 Other lack of coordination: Secondary | ICD-10-CM | POA: Diagnosis not present

## 2016-11-02 NOTE — Therapy (Signed)
[facility] [address] [place], [place], [address] Phone: [phone]   Fax:  [phone]  Physical Therapy Treatment  Patient Details  Name: Kelly [name] MRN: [id number] Date of Birth: June 08, 1927 Referring Provider: Cecille Rubin  Encounter Date: 11/02/2016      PT End of Session - 11/02/16 1116    Visit Number 61  G1 5/3 recert   Number of Visits 52  recert 5/3 for 8 wks   Date for PT Re-Evaluation 11/20/16   Authorization Type Medicare/[employer]-GCODE every 10th visit   Authorization Time Period 07/30/16- 09/25/16;   09/24/16 to 11/20/16   PT Start Time 1017   PT Stop Time 1100   PT Time Calculation (min) 43 min   Activity Tolerance Patient tolerated treatment well   Behavior During Therapy [facility] for tasks assessed/performed      Past Medical History:  Diagnosis Date  . Acute blood loss anemia   . Adjustment disorder with depressed mood   . CVA (cerebral vascular accident) ([place]) 02/03/2016   Linear infarct within the posterior limb of left internal capsule  . Diastolic CHF ([place])   . Dysphagia   . Dysphonia 02/20/2013  . Essential and other specified forms of tremor 02/20/2013  . HLD (hyperlipidemia)   . HTN (hypertension)   . OAB (overactive bladder)   . Osteoporosis   . Patient receiving subcutaneous heparin    For DVT prophylaxis 9/17  . Prediabetes   . Psoriasis   . Slow transit constipation   . Stroke (cerebrum) ([place]) 02/01/2016  . Trigeminal neuralgia 02/20/2013    Past Surgical History:  Procedure Laterality Date  . APPENDECTOMY  1940  . bladder tack    . CATARACT EXTRACTION Bilateral   . HEMORRHOID SURGERY  1960  . HUMERUS FRACTURE SURGERY    . LAPAROSCOPIC HYSTERECTOMY  2006  . torn rotator cuff    . WRIST FRACTURE SURGERY  2005   seconday shoulder 2001    There were no vitals filed for this visit.      Subjective Assessment - 11/02/16 1021    Subjective No toe pain today.  Feel  like I've got arthritis again in L hip, especially when I exercise that leg.   Patient is accompained by: Family member   Patient Stated Goals Pt's goal for therapy is to walk again.   Currently in Pain? Yes   Pain Score 5    Pain Location Hip   Pain Orientation Left   Pain Descriptors / Indicators Aching   Pain Onset In the past 7 days   Aggravating Factors  exercises worsen?   Pain Relieving Factors rest      Pt does not report any increase in pain at end of PT session.                   [facility] Adult PT Treatment/Exercise - 11/02/16 0001      Transfers   Transfers Sit to Stand;Stand to Sit   Sit to Stand 5: Supervision;With upper extremity assist;From bed   Sit to Stand Details (indicate cue type and reason) verbal cues for safe hand placement   Stand to Sit 5: Supervision;With upper extremity assist;To bed   Stand to Sit Details verbal cues for safe hand placement   Number of Reps --  At least 5 times during PT session     Ambulation/Gait   Ambulation/Gait Yes   Ambulation/Gait Assistance 4: Min guard;5: Supervision   Ambulation/Gait Assistance  Details Cues for upright posture, control of RLE to prevent recurvatum  After the standing terminal knee extension activity, pt noted to be able to control R knee recurvatum for approximately 30 ft. prior to standing rest breaks to reset, as pt goes back into recurvatum pattern.  Pt reports not sure she can feel the difference.   Ambulation Distance (Feet) 400 Feet  then 115   Assistive device Rolling walker   Gait Pattern Step-through pattern;Decreased stride length;Decreased dorsiflexion - right;Right genu recurvatum;Poor foot clearance - right   Ambulation Surface Level;Unlevel   Pre-Gait Activities Assessed pt's RW height (per request in treatment plan):  height seems appropriate at this time.  Could potentially lower RW one notch, but PT concerned about increase in forward flexed posture (due to pt needing repeated cues  during session for return to upright posture); therefore did not lower.     Posture/Postural Control   Posture/Postural Control Postural limitations   Postural Limitations Rounded Shoulders;Forward head;Increased thoracic kyphosis   Posture Comments Seated upright posture activities:  anterior pelvic tilts x 10 reps with facilitation, scapular squeezes x 10 reps, then neck retraction x 10 reps.  Cues provided throughout session to return to upright posture.     Knee/Hip Exercises: Standing   Terminal Knee Extension Strengthening;Right;1 set;10 reps;Theraband;Limitations   Theraband Level (Terminal Knee Extension) Level 2 (Red)   Terminal Knee Extension Limitations with 5 sec holds each rep; assist/stability at hip to prevent compensatory movements     Knee/Hip Exercises: Seated   Long Arc Quad AROM;Strengthening;Right;2 sets;10 reps;Weights;Limitations   Long Arc Quad Weight 4 lbs.   Long [employer] Limitations 3 sec hold with each rep, cues on form/technique  tactile/facilitation cues for upright posture   Hamstring Curl AROM;Strengthening;Right;10 reps;Limitations;2 sets   Hamstring Limitations with red band- 5 sec holds before releasing and bringing foot back up                  PT Short Term Goals - 10/22/16 1519      PT SHORT TERM GOAL #1   Title ---     PT SHORT TERM GOAL #2   Title ---     PT SHORT TERM GOAL #4   Title ---     PT SHORT TERM GOAL #7   Title ----------------------------------------------------------------------------------------------------     PT SHORT TERM GOAL #8   Title Patient will demonstrate sit to stand 8 of 10 times with correct sequencing for safety with supervision but no cues. (TARGET for STGs 10-23-16)   Time 4   Period Weeks   Status Achieved     PT SHORT TERM GOAL #9   TITLE Patient will ascend/descend curb with RW with minguard assist (no physical assist, but close proximity) without verbal cues.    Baseline met 5/29   Time 4    Period Weeks   Status Achieved           PT Long Term Goals - 09/24/16 1451      PT LONG TERM GOAL #1   Title Pt/family will verbalize understanding of fall prevention in the home environment.  TARGET 07/31/16 (NEW TARGET DATE FOR ALL CURRENT LTGS 09/25/16)  5/4 MET   Time 8   Period Weeks   Status Achieved     PT LONG TERM GOAL #2   Title Pt will improve Berg Balance score to at least 31/56 for decreased fall risk.  09/24/16   32/56   Time 8   Period [place]  Status Achieved     PT LONG TERM GOAL #3   Title Pt will improve TUG score to less than or equal to 45 seconds for decreased fall risk.     Baseline 33.41 sec 07/28/16  5/3 23.21 sec   Time 8   Period Weeks   Status Achieved     PT LONG TERM GOAL #4   Title Pt will improve gait velocity to at least 1.8 ft/sec for improved gait efficiency and safety.   Baseline 1.56 ft/sec with RW 07/28/16;  09/24/16  2.23 ft/sec   Time 8   Period Weeks   Status Achieved     PT LONG TERM GOAL #5   Title Pt will ambulate at least 800 ft indoor and outdoor surfaces, using RW, modified independently, for improved outdoor and community. (NEW TARGET date 11-20-16)   Baseline 5/2 did 600 ft   Time 8   Period Weeks   Status On-going     Additional Long Term Goals   Additional Long Term Goals Yes     PT LONG TERM GOAL #6   Title Pt will improve Neuro QOL score on FOTO by at least 20% for improved functional mobility. (NEW TARGET date 11-20-16)   Time 8   Period Weeks   Status On-going     PT LONG TERM GOAL #7   Title Patient will increase Berg score to 39/56 to indicate lesser fall risk. (TARGET date 11-20-16)   Time 8   Period Weeks   Status New     PT LONG TERM GOAL #8   Title Patient will increase her gait velocity to >=2.45 ft/sec indicative of increased safety with community ambulation. (TARGET date 11-20-16)   Time 8   Period Weeks   Status New               Plan - 11/02/16 1117    Clinical Impression Statement Skilled PT  session focused on gait training and lower extremity strengthening, to attempt to reduce R knee recurvatum with gait.  Following terminal knee extension activity (with cues), pt appears to have increased awareness of R knee control with no recurvatum noted for approximately 30 ft.  Pt returns to recurvatum pattern, but is able to correct with brief stop and verbal cues.  Pt will continue to benefit from skilled PT to progress towards unmet PT goals.   Rehab Potential Good   PT Frequency 2x / week   PT Duration 8 weeks   PT Treatment/Interventions ADLs/Self Care Home Management;Functional mobility training;Stair training;Gait training;Patient/family education;Neuromuscular re-education;Balance training;Therapeutic exercise;Therapeutic activities;DME Instruction   PT Next Visit Plan Continue to address posture in standing/sitting including chin tucks; gait distance (including outside); right LE strengthening/stretching; pt requests addition of terminal knee extension activity for home-?add to HEP?  (NEXT VISIT is visit 40-GCODE visit)   Consulted and Agree with Plan of Care Patient;Family member/caregiver   Family Member Consulted Husband      Patient will benefit from skilled therapeutic intervention in order to improve the following deficits and impairments:  Abnormal gait, Decreased balance, Decreased cognition, Decreased knowledge of use of DME, Decreased range of motion, Decreased safety awareness, Decreased strength  Visit Diagnosis: Other abnormalities of gait and mobility  Muscle weakness (generalized)     Problem List Patient Active Problem List   Diagnosis Date Noted  . Slow transit constipation   . Acute blood loss anemia   . Adjustment disorder with depressed mood   . Cerebrovascular accident (CVA) ([place]) 02/03/2016  .  Dysarthria, post-stroke   . Dysphagia, post-stroke   . Leukocytosis   . Prediabetes   . Right hemiparesis ([place])   . Cerebral infarction due to unspecified  mechanism   . Other secondary hypertension   . Overactive bladder   . Diastolic dysfunction   . Hyperlipidemia   . Essential hypertension   . Essential tremor   . Stroke (cerebrum) ([place]) 02/01/2016  . Facial droop due to stroke 02/01/2016  . Essential and other specified forms of tremor 02/20/2013  . Dysphonia 02/20/2013  . Trigeminal neuralgia 02/20/2013    MARRIOTT,AMY W. 11/02/2016, 11:21 AM  Frazier Butt., PT   [facility] [address] [place], [place], [address] Phone: [phone]   Fax:  [phone]  Name: Kelly [name] MRN: [id number] Date of Birth: February 12, 1928

## 2016-11-02 NOTE — Therapy (Signed)
[facility] [address] [place], [place], [address] Phone: [phone]   Fax:  [phone]  Occupational Therapy Treatment  Patient Details  Name: Kelly [name] MRN: [id number] Date of Birth: 19-Jul-1927 Referring Provider: Cecille Rubin, NP  Encounter Date: 11/02/2016      OT End of Session - 11/02/16 1104    Visit Number 40   Number of Visits 46  30+16=46   Date for OT Re-Evaluation 11/27/16   Authorization Type Medicare / [employer] (g-code needed)   Authorization Time Period renewal completed 07/30/16; renewal completed 09/28/16-11/27/16   Authorization - Visit Number 23   Authorization - Number of Visits 12   OT Start Time 1103   OT Stop Time 1137  needed to leave early   OT Time Calculation (min) 34 min   Activity Tolerance Patient tolerated treatment well   Behavior During Therapy [facility] for tasks assessed/performed      Past Medical History:  Diagnosis Date  . Acute blood loss anemia   . Adjustment disorder with depressed mood   . CVA (cerebral vascular accident) ([place]) 02/03/2016   Linear infarct within the posterior limb of left internal capsule  . Diastolic CHF ([place])   . Dysphagia   . Dysphonia 02/20/2013  . Essential and other specified forms of tremor 02/20/2013  . HLD (hyperlipidemia)   . HTN (hypertension)   . OAB (overactive bladder)   . Osteoporosis   . Patient receiving subcutaneous heparin    For DVT prophylaxis 9/17  . Prediabetes   . Psoriasis   . Slow transit constipation   . Stroke (cerebrum) ([place]) 02/01/2016  . Trigeminal neuralgia 02/20/2013    Past Surgical History:  Procedure Laterality Date  . APPENDECTOMY  1940  . bladder tack    . CATARACT EXTRACTION Bilateral   . HEMORRHOID SURGERY  1960  . HUMERUS FRACTURE SURGERY    . LAPAROSCOPIC HYSTERECTOMY  2006  . torn rotator cuff    . WRIST FRACTURE SURGERY  2005   seconday shoulder 2001    There were no vitals filed for this  visit.      Subjective Assessment - 11/02/16 1105    Subjective  now able to toss balloon; nothing new from MD appt; your right, I think that my spacing is off (with writing)   Patient is accompained by: Family member   Pertinent History adjustment disorder with depressed mood, diastolic CHF, osteoporosis, hx of fall, essential tremor, HTN, prediabetes, trigeminal neuralgia   Limitations fall risk   Patient Stated Goals be able to write, fold clothes, do dishes   Currently in Pain? No/denies       Low range functional reaching to place large pegs in pegboard with mod-max difficulty with in-hand manipulation, but mod difficulty putting in pegboard.  Removing using in-hand manipulation with mod-max difficulty.  Wt. Bearing in modified quadraped with hands over blocks with body on arm movements with LUE reaching for decr tone and incr tricep ext and full composite wrist/finger ext.  Then, RUE reaching in this position to place checkers in connect 4 slots with min difficulty and set-up for incr elbow ext.  Writing 2 sentences with approx 85-90% legibility for 1st sentence and approx 75-85% for 2nd sentence.  Then practiced writing letters in boxes for improved spacing.                           OT Short Term Goals - 11/02/16  [place] #5   Title Pt will be able to cut food mod I.--check updated STGs 10/28/16   Status Partially Met  08/24/16:  improved and is performing inconsistently;  09/28/16  continues to have difficulty.  10/20/16  pt able to cut soft food and was able to return demo use of rocker knife for simulated meat cutting in clinic     OT SHORT TERM GOAL #6   Title Pt will be able to write 1-2 sentences with 100% legibility.   Status Revised  08/25/16  approx 75% legibility today.  09/24/16  approx 85% legibility.  10/20/16  1 sentence with approx 95% legibility, 2nd sentence with approx 85% legibility, 11/02/16  not met     OT SHORT TERM GOAL #7    Title Pt will be able to reach to retrieve cylinder objects with RUE from at least 75* shoulder flex with 80% accuracy.   Status Achieved  10/20/16  met with approx 80% accuracy in sitting           OT Long Term Goals - 11/02/16 1117      OT LONG TERM GOAL #4   Title Pt will improve functional reaching and grasp/release as shown by improving score on box and blocks to at least 25.--check updated LTGs 11/27/16  Revised 07/09/16:  as pt met initial goal of improving by 10   Baseline 3 blocks   Period Weeks   Status Achieved  07/09/16  Met, and revised goal;  07/30/16  17 blocks.  09/24/16  21 blocks.  10/20/16:  25 blocks     OT LONG TERM GOAL #6   Title --------     OT LONG TERM GOAL #7   Title Pt will demo improved coordination for ADLs as shown by completing 9-hole peg test in 66mn or less.   Period Weeks   Status Achieved  08/25/16:  placed 4 pegs in within 227m, completed in 44m15m37.79sec.  09/24/16:   2mi77m1.97sec.  10/20/16  1min57m.38sec     OT LONG TERM GOAL #8   Title Pt will be able to reach to retrieve cylinder objects with RUE from at least 85* shoulder flex with 80% accuracy.   Period Weeks   Status New     OT LONG TERM GOAL  #9   Baseline Pt will be able to manipulate 1 inch object in R hand with at least 75% accuracy.   Status On-going               Plan - 11/02/16 1146    Clinical Impression Statement Pt continues to demo improving coordination, but continues to demo significant difficulty with in-hand mainipulation.  Pt would benefit from futher occupational therapy to address in-hand manipulation and grasp/release for improved RUE functional use.   Rehab Potential Good   OT Frequency 2x / week   OT Duration --  9 weeks   OT Treatment/Interventions Self-care/ADL training;Therapeutic exercise;DME and/or AE instruction;FunctTherapist, nutritionalapeutic activities;Patient/family education;Balance training;Neuromuscular education;Electrical Stimulation;Moist  Heat;Energy conservation;Passive range of motion;Therapeutic exercises;Splinting;Manual Therapy;Fluidtherapy;Ultrasound;Cryotherapy;Parrafin   Plan continue with RUE functional use, grasp/release, in-hand manipulation, neuro re-ed   OT Home Exercise Plan Education provided:  06/05/15 Initial HEP (table slides with light wt. bearing, tracing, and flipping cards); 06/22/16 supine ball ex and thumb opposition; 09/22/16  updates to HEP   Consulted and Agree with Plan of Care Patient   Family Member Consulted husband  Patient will benefit from skilled therapeutic intervention in order to improve the following deficits and impairments:  Decreased coordination, Decreased activity tolerance, Decreased knowledge of use of DME, Decreased strength, Impaired UE functional use, Impaired tone, Decreased range of motion, Decreased balance, Decreased mobility, Difficulty walking  Visit Diagnosis: Hemiplegia and hemiparesis following cerebral infarction affecting right dominant side (HCC)  Muscle weakness (generalized)  Unsteadiness on feet  Other abnormalities of gait and mobility  Other lack of coordination  Other symptoms and signs involving the nervous system      G-Codes - November 05, 2016 1258    Functional Assessment Tool Used (Outpatient only) 9-hole peg test:   R 100.38 sec.  Box and blocks test:  R-25 blocks   Functional Limitation Carrying, moving and handling objects   Carrying, Moving and Handling Objects Current Status [phone]) At least 20 percent but less than 40 percent impaired, limited or restricted   Carrying, Moving and Handling Objects Goal Status ([id number]) At least 20 percent but less than 40 percent impaired, limited or restricted      Problem List Patient Active Problem List   Diagnosis Date Noted  . Slow transit constipation   . Acute blood loss anemia   . Adjustment disorder with depressed mood   . Cerebrovascular accident (CVA) ([place]) 02/03/2016  . Dysarthria, post-stroke   .  Dysphagia, post-stroke   . Leukocytosis   . Prediabetes   . Right hemiparesis ([place])   . Cerebral infarction due to unspecified mechanism   . Other secondary hypertension   . Overactive bladder   . Diastolic dysfunction   . Hyperlipidemia   . Essential hypertension   . Essential tremor   . Stroke (cerebrum) ([place]) 02/01/2016  . Facial droop due to stroke 02/01/2016  . Essential and other specified forms of tremor 02/20/2013  . Dysphonia 02/20/2013  . Trigeminal neuralgia 02/20/2013   Occupational Therapy Progress Note  Dates of Reporting Period: 09/28/16 to 2016/11/05  Objective Reports of Subjective Statement: see above  Objective Measurements: see above  Goal Update: see above  Plan: see above  Reason Skilled Services are Required: see above    [facility] 05-Nov-2016, 1:04 PM  [facility] [address] [place], [place], [address] Phone: [phone]   Fax:  [phone]  Name: XOCHILT CONANT MRN: [id number] Date of Birth: [birth year]   Vianne Bulls, OTR/L [facility] [address] [place], [place]  [address] [phone] phone [phone] 11/05/16 1:04 PM

## 2016-11-03 ENCOUNTER — Ambulatory Visit: Payer: Medicare Other | Admitting: Occupational Therapy

## 2016-11-03 DIAGNOSIS — I69351 Hemiplegia and hemiparesis following cerebral infarction affecting right dominant side: Secondary | ICD-10-CM | POA: Diagnosis not present

## 2016-11-03 DIAGNOSIS — R278 Other lack of coordination: Secondary | ICD-10-CM | POA: Diagnosis not present

## 2016-11-03 DIAGNOSIS — R29818 Other symptoms and signs involving the nervous system: Secondary | ICD-10-CM

## 2016-11-03 DIAGNOSIS — R2681 Unsteadiness on feet: Secondary | ICD-10-CM | POA: Diagnosis not present

## 2016-11-03 DIAGNOSIS — R2689 Other abnormalities of gait and mobility: Secondary | ICD-10-CM

## 2016-11-03 DIAGNOSIS — M6281 Muscle weakness (generalized): Secondary | ICD-10-CM | POA: Diagnosis not present

## 2016-11-03 NOTE — Therapy (Signed)
[facility] [address] [place], [place], [address] Phone: [phone]   Fax:  [phone]  Occupational Therapy Treatment  Patient Details  Name: Kelly [name] MRN: [id number] Date of Birth: [birth year] Referring Provider: Cecille Rubin, NP  Encounter Date: 11/03/2016      OT End of Session - 11/03/16 1021    Visit Number 41   Number of Visits 64  30+16=46   Date for OT Re-Evaluation 11/27/16   Authorization Type Medicare / [employer] (g-code needed)   Authorization Time Period renewal completed 07/30/16; renewal completed 09/28/16-11/27/16   Authorization - Visit Number 50   Authorization - Number of Visits 50   OT Start Time 1018   OT Stop Time 1100   OT Time Calculation (min) 42 min   Activity Tolerance Patient tolerated treatment well   Behavior During Therapy [facility] for tasks assessed/performed      Past Medical History:  Diagnosis Date  . Acute blood loss anemia   . Adjustment disorder with depressed mood   . CVA (cerebral vascular accident) ([place]) 02/03/2016   Linear infarct within the posterior limb of left internal capsule  . Diastolic CHF ([place])   . Dysphagia   . Dysphonia 02/20/2013  . Essential and other specified forms of tremor 02/20/2013  . HLD (hyperlipidemia)   . HTN (hypertension)   . OAB (overactive bladder)   . Osteoporosis   . Patient receiving subcutaneous heparin    For DVT prophylaxis 9/17  . Prediabetes   . Psoriasis   . Slow transit constipation   . Stroke (cerebrum) ([place]) 02/01/2016  . Trigeminal neuralgia 02/20/2013    Past Surgical History:  Procedure Laterality Date  . APPENDECTOMY  1940  . bladder tack    . CATARACT EXTRACTION Bilateral   . HEMORRHOID SURGERY  1960  . HUMERUS FRACTURE SURGERY    . LAPAROSCOPIC HYSTERECTOMY  2006  . torn rotator cuff    . WRIST FRACTURE SURGERY  2005   seconday shoulder 2001    There were no vitals filed for this visit.      Subjective  Assessment - 11/03/16 1020    Patient is accompained by: Family member   Pertinent History adjustment disorder with depressed mood, diastolic CHF, osteoporosis, hx of fall, essential tremor, HTN, prediabetes, trigeminal neuralgia   Limitations fall risk   Patient Stated Goals be able to write, fold clothes, do dishes   Currently in Pain? No/denies       Wt. Bearing in modified quadraped with hands over blocks with body on arm movements with LUE reaching for decr tone and incr tricep ext and full composite wrist/finger ext.  Then, RUE reaching in this position to stack blocks with min-mod difficulty and set-up for incr elbow ext.  Wt. Bearing through R hand on mat with body on arm movements/LUE functional reaching for decr tone.    Functional mid-range reaching to grasp/release cylinder objects with only 1-2 drops with fatigue.  Pt with min compensation at shoulder.  Placing medium pegs in pegboard with R hand to encourage in-hand manipulation/coordination with mod difficulty/shoulder compensation at times.  Tossing scarves with RUE to encourage finger and elbow ext to release with mod difficulty.  Then catching scarves with R hand to encourage finger ext for grasp with mod difficulty/cues.  Sliding cards across table with emphasis on finger/wrist and elbow ext with min difficulty/cues.  OT Short Term Goals - 11/02/16 1118      OT SHORT TERM GOAL #5   Title Pt will be able to cut food mod I.--check updated STGs 10/28/16   Status Partially Met  08/24/16:  improved and is performing inconsistently;  09/28/16  continues to have difficulty.  10/20/16  pt able to cut soft food and was able to return demo use of rocker knife for simulated meat cutting in clinic     OT SHORT TERM GOAL #6   Title Pt will be able to write 1-2 sentences with 100% legibility.   Status Revised  08/25/16  approx 75% legibility today.  09/24/16  approx 85% legibility.   10/20/16  1 sentence with approx 95% legibility, 2nd sentence with approx 85% legibility, 11/02/16  not met     OT SHORT TERM GOAL #7   Title Pt will be able to reach to retrieve cylinder objects with RUE from at least 75* shoulder flex with 80% accuracy.   Status Achieved  10/20/16  met with approx 80% accuracy in sitting           OT Long Term Goals - 11/03/16 1030      OT LONG TERM GOAL #4   Title Pt will improve functional reaching and grasp/release as shown by improving score on box and blocks to at least 25.--check updated LTGs 11/27/16  Revised 07/09/16:  as pt met initial goal of improving by 10   Baseline 3 blocks   Period Weeks   Status Achieved  07/09/16  Met, and revised goal;  07/30/16  17 blocks.  09/24/16  21 blocks.  10/20/16:  25 blocks     OT LONG TERM GOAL #6   Title --------     OT LONG TERM GOAL #7   Title Pt will demo improved coordination for ADLs as shown by completing 9-hole peg test in 48mn or less.   Period Weeks   Status Achieved  08/25/16:  placed 4 pegs in within 287m, completed in 51m14m37.79sec.  09/24/16:   2mi74m1.97sec.  10/20/16  1min2m.38sec     OT LONG TERM GOAL #8   Title Pt will be able to reach to retrieve cylinder objects with RUE from at least 85* shoulder flex with 80% accuracy.   Period Weeks   Status Achieved  11/03/16:  met at approx this level     OT LONG TERM GOAL  #9   Baseline Pt will be able to manipulate 1 inch object in R hand with at least 75% accuracy.   Status On-going               Plan - 11/03/16 1026    Rehab Potential Good   OT Frequency 2x / week   OT Duration --  9 weeks   OT Treatment/Interventions Self-care/ADL training;Therapeutic exercise;DME and/or AE instruction;FunctTherapist, nutritionalapeutic activities;Patient/family education;Balance training;Neuromuscular education;Electrical Stimulation;Moist Heat;Energy conservation;Passive range of motion;Therapeutic exercises;Splinting;Manual  Therapy;Fluidtherapy;Ultrasound;Cryotherapy;Parrafin   Plan continue with RUE funcitonal use, grasp/release, in-hand manipulation, neuro re-ed   OT Home Exercise Plan Education provided:  06/05/15 Initial HEP (table slides with light wt. bearing, tracing, and flipping cards); 06/22/16 supine ball ex and thumb opposition; 09/22/16  updates to HEP   Consulted and Agree with Plan of Care Patient   Family Member Consulted husband      Patient will benefit from skilled therapeutic intervention in order to improve the following deficits and impairments:  Decreased coordination, Decreased activity tolerance, Decreased knowledge of  use of DME, Decreased strength, Impaired UE functional use, Impaired tone, Decreased range of motion, Decreased balance, Decreased mobility, Difficulty walking  Visit Diagnosis: Hemiplegia and hemiparesis following cerebral infarction affecting right dominant side (HCC)  Other abnormalities of gait and mobility  Muscle weakness (generalized)  Unsteadiness on feet  Other lack of coordination  Other symptoms and signs involving the nervous system      G-Codes - 11/24/2016 1258    Functional Assessment Tool Used (Outpatient only) 9-hole peg test:   R 100.38 sec.  Box and blocks test:  R-25 blocks   Functional Limitation Carrying, moving and handling objects   Carrying, Moving and Handling Objects Current Status [phone]) At least 20 percent but less than 40 percent impaired, limited or restricted   Carrying, Moving and Handling Objects Goal Status ([id number]) At least 20 percent but less than 40 percent impaired, limited or restricted      Problem List Patient Active Problem List   Diagnosis Date Noted  . Slow transit constipation   . Acute blood loss anemia   . Adjustment disorder with depressed mood   . Cerebrovascular accident (CVA) ([place]) 02/03/2016  . Dysarthria, post-stroke   . Dysphagia, post-stroke   . Leukocytosis   . Prediabetes   . Right hemiparesis ([place])    . Cerebral infarction due to unspecified mechanism   . Other secondary hypertension   . Overactive bladder   . Diastolic dysfunction   . Hyperlipidemia   . Essential hypertension   . Essential tremor   . Stroke (cerebrum) ([place]) 02/01/2016  . Facial droop due to stroke 02/01/2016  . Essential and other specified forms of tremor 02/20/2013  . Dysphonia 02/20/2013  . Trigeminal neuralgia 02/20/2013    [facility] 11/03/2016, 10:48 AM  [facility] [address] [place], [place], [address] Phone: [phone]   Fax:  [phone]  Name: SALEEMAH MOLLENHAUER MRN: [id number] Date of Birth: [birth year]   Vianne Bulls, OTR/L [facility] [address] [place], [place]  [address] [phone] phone [phone] 11/03/16 1:47 PM

## 2016-11-05 ENCOUNTER — Encounter: Payer: Self-pay | Admitting: Physical Therapy

## 2016-11-05 ENCOUNTER — Ambulatory Visit: Payer: Medicare Other | Admitting: Physical Therapy

## 2016-11-05 DIAGNOSIS — I69351 Hemiplegia and hemiparesis following cerebral infarction affecting right dominant side: Secondary | ICD-10-CM | POA: Diagnosis not present

## 2016-11-05 DIAGNOSIS — R2681 Unsteadiness on feet: Secondary | ICD-10-CM | POA: Diagnosis not present

## 2016-11-05 DIAGNOSIS — R2689 Other abnormalities of gait and mobility: Secondary | ICD-10-CM

## 2016-11-05 DIAGNOSIS — M6281 Muscle weakness (generalized): Secondary | ICD-10-CM

## 2016-11-05 DIAGNOSIS — R29818 Other symptoms and signs involving the nervous system: Secondary | ICD-10-CM | POA: Diagnosis not present

## 2016-11-05 DIAGNOSIS — R278 Other lack of coordination: Secondary | ICD-10-CM | POA: Diagnosis not present

## 2016-11-06 NOTE — Therapy (Signed)
Kelly [name] [address] [place], [place], [address] Phone: [phone]   Fax:  [phone]  Physical Therapy Treatment  Patient Details  Name: Kelly [name] MRN: [id number] Date of Birth: 1927/10/04 Referring Provider: Cecille Rubin  Encounter Date: 11/05/2016      PT End of Session - 11/05/16 1408    Visit Number 40  G3   Number of Visits 52  recert 5/3 for 8 wks   Date for PT Re-Evaluation 11/20/16   Authorization Type Medicare/[place] [employer] every 10th visit   Authorization Time Period 07/30/16- 09/25/16;   09/24/16 to 11/20/16   PT Start Time 1405   PT Stop Time 1445   PT Time Calculation (min) 40 min   Activity Tolerance Patient tolerated treatment well   Behavior During Therapy [facility] for tasks assessed/performed      Past Medical History:  Diagnosis Date  . Acute blood loss anemia   . Adjustment disorder with depressed mood   . CVA (cerebral vascular accident) ([place]) 02/03/2016   Linear infarct within the posterior limb of left internal capsule  . Diastolic CHF ([place])   . Dysphagia   . Dysphonia 02/20/2013  . Essential and other specified forms of tremor 02/20/2013  . HLD (hyperlipidemia)   . HTN (hypertension)   . OAB (overactive bladder)   . Osteoporosis   . Patient receiving subcutaneous heparin    For DVT prophylaxis 9/17  . Prediabetes   . Psoriasis   . Slow transit constipation   . Stroke (cerebrum) ([place]) 02/01/2016  . Trigeminal neuralgia 02/20/2013    Past Surgical History:  Procedure Laterality Date  . APPENDECTOMY  1940  . bladder tack    . CATARACT EXTRACTION Bilateral   . HEMORRHOID SURGERY  1960  . HUMERUS FRACTURE SURGERY    . LAPAROSCOPIC HYSTERECTOMY  2006  . torn rotator cuff    . WRIST FRACTURE SURGERY  2005   seconday shoulder 2001    There were no vitals filed for this visit.      Subjective Assessment - 11/05/16 1408    Subjective No new complaints. No falls or pain  to report today. States her left hip is better today.    Patient is accompained by: Family member   Patient Stated Goals Pt's goal for therapy is to walk again.   Currently in Pain? No/denies   Pain Score 0-No pain            [facility] Adult PT Treatment/Exercise - 11/05/16 1410      Transfers   Transfers Sit to Stand;Stand to Sit   Sit to Stand 5: Supervision;With upper extremity assist;From bed   Sit to Stand Details (indicate cue type and reason) increased time needed to stand up fully   Stand to Sit 5: Supervision;With upper extremity assist;To bed   Stand to Sit Details increased time needed for controlled sitting      Ambulation/Gait   Ambulation/Gait Yes   Ambulation/Gait Assistance 5: Supervision   Ambulation/Gait Assistance Details cues for posture/to look up, occasionally for right foot clearance and for right knee controll in stance. right foot slap noted at times in session as well.    Ambulation Distance (Feet) 450 Feet  x1   Assistive device Rolling walker   Gait Pattern Step-through pattern;Decreased stride length;Decreased dorsiflexion - right;Right genu recurvatum;Poor foot clearance - right   Ambulation Surface Level;Indoor     Neuro Re-ed    Neuro Re-ed Details  at bottom of  steps:- toe taps up/down bottom 2 steps x 10 reps each with cues to lift leg vs sliding it along step. light UE support on rails for balance support.      Knee/Hip Exercises: Machines for Strengthening   Cybex Leg Press 40# bil legs 2 sets of 10 reps with 5 sec holds;, 30# right leg only 5 sec holds 2 sets of 10 reps. manual assist provided to right knee to prevent hyperextension with each rep.      Knee/Hip Exercises: Standing   Forward Step Up Right;2 sets;10 reps;Hand Hold: 2;Step Height: 6";Limitations   Forward Step Up Limitations using bottom step with light UE support on rails, cues for right knee control with lifting up (manual assist/tactile cues to prevent recurvatum)                     Other Standing Knee Exercises heel raises with bil UE support: 5 sec holds x 10 reps             PT Short Term Goals - 10/22/16 1519      PT SHORT TERM GOAL #1   Title ---     PT SHORT TERM GOAL #2   Title ---     PT SHORT TERM GOAL #4   Title ---     PT SHORT TERM GOAL #7   Title ----------------------------------------------------------------------------------------------------     PT SHORT TERM GOAL #8   Title Patient will demonstrate sit to stand 8 of 10 times with correct sequencing for safety with supervision but no cues. (TARGET for STGs 10-23-16)   Time 4   Period Weeks   Status Achieved     PT SHORT TERM GOAL #9   TITLE Patient will ascend/descend curb with RW with minguard assist (no physical assist, but close proximity) without verbal cues.    Baseline met 5/29   Time 4   Period Weeks   Status Achieved           PT Long Term Goals - 09/24/16 1451      PT LONG TERM GOAL #1   Title Pt/family will verbalize understanding of fall prevention in the home environment.  TARGET 07/31/16 (NEW TARGET DATE FOR ALL CURRENT LTGS 09/25/16)  5/4 MET   Time 8   Period Weeks   Status Achieved     PT LONG TERM GOAL #2   Title Pt will improve Berg Balance score to at least 31/56 for decreased fall risk.  09/24/16   32/56   Time 8   Period Weeks   Status Achieved     PT LONG TERM GOAL #3   Title Pt will improve TUG score to less than or equal to 45 seconds for decreased fall risk.     Baseline 33.41 sec 07/28/16  5/3 23.21 sec   Time 8   Period Weeks   Status Achieved     PT LONG TERM GOAL #4   Title Pt will improve gait velocity to at least 1.8 ft/sec for improved gait efficiency and safety.   Baseline 1.56 ft/sec with RW 07/28/16;  09/24/16  2.23 ft/sec   Time 8   Period Weeks   Status Achieved     PT LONG TERM GOAL #5   Title Pt will ambulate at least 800 ft indoor and outdoor surfaces, using RW, modified independently, for improved outdoor and community. (NEW  TARGET date 11-20-16)   Baseline 5/2 did 600 ft   Time 8   Period  Weeks   Status On-going     Additional Long Term Goals   Additional Long Term Goals Yes     PT LONG TERM GOAL #6   Title Pt will improve Neuro QOL score on FOTO by at least 20% for improved functional mobility. (NEW TARGET date 11-20-16)   Time 8   Period Weeks   Status On-going     PT LONG TERM GOAL #7   Title Patient will increase Berg score to 39/56 to indicate lesser fall risk. (TARGET date 11-20-16)   Time 8   Period Weeks   Status New     PT LONG TERM GOAL #8   Title Patient will increase her gait velocity to >=2.45 ft/sec indicative of increased safety with community ambulation. (TARGET date 11-20-16)   Time 8   Period Weeks   Status New            Plan - 11/05/16 1409    Clinical Impression Statement Today's skilled session focused on gait distance and step length with RW with pt increased her distance some from previous session. Remainder of session addressed right LE strengthening with no issues reported. Pt loved being on leg press. Discussed return to [facility] where pt could use nustep, leg press and other machines. Spouse reports they already have a trainer lined up to work with pt after she wraps up with PT. Pt is progressing toward goals and should benefit from contiunued PT to progress toward unmet goals.    Rehab Potential Good   PT Frequency 2x / week   PT Duration 8 weeks   PT Treatment/Interventions ADLs/Self Care Home Management;Functional mobility training;Stair training;Gait training;Patient/family education;Neuromuscular re-education;Balance training;Therapeutic exercise;Therapeutic activities;DME Instruction   PT Next Visit Plan Continue to address posture in standing/sitting including chin tucks; gait distance (including outside); right LE strengthening/stretching   Consulted and Agree with Plan of Care Patient;Family member/caregiver   Family Member Consulted Husband      Patient will  benefit from skilled therapeutic intervention in order to improve the following deficits and impairments:  Abnormal gait, Decreased balance, Decreased cognition, Decreased knowledge of use of DME, Decreased range of motion, Decreased safety awareness, Decreased strength  Visit Diagnosis: Other abnormalities of gait and mobility  Hemiplegia and hemiparesis following cerebral infarction affecting right dominant side (HCC)  Muscle weakness (generalized)  Unsteadiness on feet     Problem List Patient Active Problem List   Diagnosis Date Noted  . Slow transit constipation   . Acute blood loss anemia   . Adjustment disorder with depressed mood   . Cerebrovascular accident (CVA) ([place]) 02/03/2016  . Dysarthria, post-stroke   . Dysphagia, post-stroke   . Leukocytosis   . Prediabetes   . Right hemiparesis ([place])   . Cerebral infarction due to unspecified mechanism   . Other secondary hypertension   . Overactive bladder   . Diastolic dysfunction   . Hyperlipidemia   . Essential hypertension   . Essential tremor   . Stroke (cerebrum) ([place]) 02/01/2016  . Facial droop due to stroke 02/01/2016  . Essential and other specified forms of tremor 02/20/2013  . Dysphonia 02/20/2013  . Trigeminal neuralgia 02/20/2013    Willow Ora, PTA, [facility] [address] [place],  [address] [phone] 11/06/16, 1:49 PM   Name: MILAGROS MIDDENDORF MRN: [id number] Date of Birth: 1928-05-05

## 2016-11-09 ENCOUNTER — Ambulatory Visit: Payer: Medicare Other | Admitting: Physical Therapy

## 2016-11-09 ENCOUNTER — Encounter: Payer: Self-pay | Admitting: Physical Therapy

## 2016-11-09 DIAGNOSIS — R278 Other lack of coordination: Secondary | ICD-10-CM | POA: Diagnosis not present

## 2016-11-09 DIAGNOSIS — R2681 Unsteadiness on feet: Secondary | ICD-10-CM | POA: Diagnosis not present

## 2016-11-09 DIAGNOSIS — I69351 Hemiplegia and hemiparesis following cerebral infarction affecting right dominant side: Secondary | ICD-10-CM

## 2016-11-09 DIAGNOSIS — M6281 Muscle weakness (generalized): Secondary | ICD-10-CM | POA: Diagnosis not present

## 2016-11-09 DIAGNOSIS — R29818 Other symptoms and signs involving the nervous system: Secondary | ICD-10-CM | POA: Diagnosis not present

## 2016-11-09 DIAGNOSIS — R2689 Other abnormalities of gait and mobility: Secondary | ICD-10-CM

## 2016-11-09 NOTE — Therapy (Signed)
[facility] [address] [place], [place], [address] Phone: [phone]   Fax:  [phone]  Physical Therapy Treatment  Patient Details  Name: Kelly [name] MRN: [id number] Date of Birth: 11/23/1927 Referring Provider: Cecille Rubin  Encounter Date: 11/09/2016      PT End of Session - 11/09/16 1830    Visit Number 30  G4   Number of Visits 52  recert 5/3 for 8 wks   Date for PT Re-Evaluation 11/20/16   Authorization Type Medicare/[employer] every 10th visit   Authorization Time Period 07/30/16- 09/25/16;   09/24/16 to 11/20/16   PT Start Time 1146   PT Stop Time 1230   PT Time Calculation (min) 44 min   Activity Tolerance Patient tolerated treatment well   Behavior During Therapy [facility] for tasks assessed/performed      Past Medical History:  Diagnosis Date  . Acute blood loss anemia   . Adjustment disorder with depressed mood   . CVA (cerebral vascular accident) ([place]) 02/03/2016   Linear infarct within the posterior limb of left internal capsule  . Diastolic CHF ([place])   . Dysphagia   . Dysphonia 02/20/2013  . Essential and other specified forms of tremor 02/20/2013  . HLD (hyperlipidemia)   . HTN (hypertension)   . OAB (overactive bladder)   . Osteoporosis   . Patient receiving subcutaneous heparin    For DVT prophylaxis 9/17  . Prediabetes   . Psoriasis   . Slow transit constipation   . Stroke (cerebrum) ([place]) 02/01/2016  . Trigeminal neuralgia 02/20/2013    Past Surgical History:  Procedure Laterality Date  . APPENDECTOMY  1940  . bladder tack    . CATARACT EXTRACTION Bilateral   . HEMORRHOID SURGERY  1960  . HUMERUS FRACTURE SURGERY    . LAPAROSCOPIC HYSTERECTOMY  2006  . torn rotator cuff    . WRIST FRACTURE SURGERY  2005   seconday shoulder 2001    There were no vitals filed for this visit.      Subjective Assessment - 11/09/16 1151    Subjective Had a good weekend. Wants to use leg  press again today.    Patient is accompained by: Family member   Patient Stated Goals Pt's goal for therapy is to walk again.                         [facility] Adult PT Treatment/Exercise - 11/09/16 1749      Transfers   Transfers Sit to Stand;Stand to Sit   Sit to Stand 5: Supervision;With upper extremity assist   Sit to Stand Details (indicate cue type and reason) begins to pull up on walker and then realizes her mistake and self-corrects   Stand to Sit 5: Supervision   Stand to Sit Details for safety     Ambulation/Gait   Ambulation/Gait Assistance 5: Supervision   Ambulation/Gait Assistance Details vc for proximity to RW, upright posture, Rt heelstike   Ambulation Distance (Feet) 120 Feet  120, 50, 50    Assistive device Rolling walker vs lt // bar only   Gait Pattern Step-through pattern;Decreased stride length;Decreased dorsiflexion - right;Right genu recurvatum;Poor foot clearance - right  rt pelvis posteriorly rotated   Ambulation Surface Level     Posture/Postural Control   Posture/Postural Control Postural limitations   Postural Limitations Rounded Shoulders;Forward head;Increased thoracic kyphosis   Posture Comments vc reminders throughout session     Neuro Re-ed  Neuro Re-ed Details  at counter turned sideways, staggered stance worked on weight shift anteriorly over front leg with full hip extension, shift to back foot and lift front toe (simulating heelstrike to midstance)      Knee/Hip Exercises: Machines for Strengthening   Cybex Leg Press 50# bil 2 sets; 30# RLE x 3 sets     Knee/Hip Exercises: Standing   Heel Raises Both;1 set   Other Standing Knee Exercises walking sideways while in "mini-squat" for knee control and strengthening     Ankle Exercises: Seated   Other Seated Ankle Exercises bil no band; Rt now better than lt                  PT Short Term Goals - 10/22/16 1519      PT SHORT TERM GOAL #1   Title ---     PT SHORT  TERM GOAL #2   Title ---     PT SHORT TERM GOAL #4   Title ---     PT SHORT TERM GOAL #7   Title ----------------------------------------------------------------------------------------------------     PT SHORT TERM GOAL #8   Title Patient will demonstrate sit to stand 8 of 10 times with correct sequencing for safety with supervision but no cues. (TARGET for STGs 10-23-16)   Time 4   Period Weeks   Status Achieved     PT SHORT TERM GOAL #9   TITLE Patient will ascend/descend curb with RW with minguard assist (no physical assist, but close proximity) without verbal cues.    Baseline met 5/29   Time 4   Period Weeks   Status Achieved           PT Long Term Goals - 09/24/16 1451      PT LONG TERM GOAL #1   Title Pt/family will verbalize understanding of fall prevention in the home environment.  TARGET 07/31/16 (NEW TARGET DATE FOR ALL CURRENT LTGS 09/25/16)  5/4 MET   Time 8   Period Weeks   Status Achieved     PT LONG TERM GOAL #2   Title Pt will improve Berg Balance score to at least 31/56 for decreased fall risk.  09/24/16   32/56   Time 8   Period Weeks   Status Achieved     PT LONG TERM GOAL #3   Title Pt will improve TUG score to less than or equal to 45 seconds for decreased fall risk.     Baseline 33.41 sec 07/28/16  5/3 23.21 sec   Time 8   Period Weeks   Status Achieved     PT LONG TERM GOAL #4   Title Pt will improve gait velocity to at least 1.8 ft/sec for improved gait efficiency and safety.   Baseline 1.56 ft/sec with RW 07/28/16;  09/24/16  2.23 ft/sec   Time 8   Period Weeks   Status Achieved     PT LONG TERM GOAL #5   Title Pt will ambulate at least 800 ft indoor and outdoor surfaces, using RW, modified independently, for improved outdoor and community. (NEW TARGET date 11-20-16)   Baseline 5/2 did 600 ft   Time 8   Period Weeks   Status On-going     Additional Long Term Goals   Additional Long Term Goals Yes     PT LONG TERM GOAL #6   Title Pt will  improve Neuro QOL score on FOTO by at least 20% for improved functional mobility. (NEW TARGET date  11-20-16)   Time 8   Period Weeks   Status On-going     PT LONG TERM GOAL #7   Title Patient will increase Berg score to 39/56 to indicate lesser fall risk. (TARGET date 11-20-16)   Time 8   Period Weeks   Status New     PT LONG TERM GOAL #8   Title Patient will increase her gait velocity to >=2.45 ft/sec indicative of increased safety with community ambulation. (TARGET date 11-20-16)   Time 8   Period Weeks   Status New               Plan - 11/09/16 1832    Clinical Impression Statement Session focused on gait training (including Rt knee control), strengthening, and balance training. Patient mentioned that she looks forward to using the leg press machine where she will work out after finishing PT. Continues to progress towards LTGs.    Rehab Potential Good   PT Frequency 2x / week   PT Duration 8 weeks   PT Treatment/Interventions ADLs/Self Care Home Management;Functional mobility training;Stair training;Gait training;Patient/family education;Neuromuscular re-education;Balance training;Therapeutic exercise;Therapeutic activities;DME Instruction   PT Next Visit Plan Continue to address posture; gait and gait distance (including outside); right LE strengthening (likes leg press) ?try nustep at higher resistance; balance   Consulted and Agree with Plan of Care Patient;Family member/caregiver   Family Member Consulted Husband      Patient will benefit from skilled therapeutic intervention in order to improve the following deficits and impairments:  Abnormal gait, Decreased balance, Decreased cognition, Decreased knowledge of use of DME, Decreased range of motion, Decreased safety awareness, Decreased strength  Visit Diagnosis: Other abnormalities of gait and mobility  Hemiplegia and hemiparesis following cerebral infarction affecting right dominant side (HCC)  Muscle weakness  (generalized)  Unsteadiness on feet     Problem List Patient Active Problem List   Diagnosis Date Noted  . Slow transit constipation   . Acute blood loss anemia   . Adjustment disorder with depressed mood   . Cerebrovascular accident (CVA) ([place]) 02/03/2016  . Dysarthria, post-stroke   . Dysphagia, post-stroke   . Leukocytosis   . Prediabetes   . Right hemiparesis ([place])   . Cerebral infarction due to unspecified mechanism   . Other secondary hypertension   . Overactive bladder   . Diastolic dysfunction   . Hyperlipidemia   . Essential hypertension   . Essential tremor   . Stroke (cerebrum) ([place]) 02/01/2016  . Facial droop due to stroke 02/01/2016  . Essential and other specified forms of tremor 02/20/2013  . Dysphonia 02/20/2013  . Trigeminal neuralgia 02/20/2013    Rexanne Mano, PT 11/09/2016, 7:00 PM  [facility] [address] [place], [place], [address] Phone: [phone]   Fax:  [phone]  Name: Kelly [name] MRN: [id number] Date of Birth: October 26, 1927

## 2016-11-13 ENCOUNTER — Ambulatory Visit: Payer: Medicare Other | Admitting: Physical Therapy

## 2016-11-16 ENCOUNTER — Encounter: Payer: Medicare Other | Admitting: Occupational Therapy

## 2016-11-17 ENCOUNTER — Ambulatory Visit: Payer: Medicare Other | Admitting: Physical Therapy

## 2016-11-17 ENCOUNTER — Encounter: Payer: Self-pay | Admitting: Physical Therapy

## 2016-11-17 ENCOUNTER — Ambulatory Visit: Payer: Medicare Other | Admitting: Occupational Therapy

## 2016-11-17 DIAGNOSIS — R278 Other lack of coordination: Secondary | ICD-10-CM | POA: Diagnosis not present

## 2016-11-17 DIAGNOSIS — R2689 Other abnormalities of gait and mobility: Secondary | ICD-10-CM

## 2016-11-17 DIAGNOSIS — I69351 Hemiplegia and hemiparesis following cerebral infarction affecting right dominant side: Secondary | ICD-10-CM | POA: Diagnosis not present

## 2016-11-17 DIAGNOSIS — M6281 Muscle weakness (generalized): Secondary | ICD-10-CM

## 2016-11-17 DIAGNOSIS — R2681 Unsteadiness on feet: Secondary | ICD-10-CM

## 2016-11-17 DIAGNOSIS — R29818 Other symptoms and signs involving the nervous system: Secondary | ICD-10-CM | POA: Diagnosis not present

## 2016-11-17 NOTE — Therapy (Signed)
[facility] [address] [place], [place], [address] Phone: [phone]   Fax:  [phone]  Occupational Therapy Treatment  Patient Details  Name: Kelly [name] MRN: [id number] Date of Birth: 15-Feb-1928 Referring Provider: Cecille Rubin, NP  Encounter Date: 11/17/2016      OT End of Session - 11/17/16 2018    Visit Number 42   Number of Visits 46  30+16=46   Date for OT Re-Evaluation 11/27/16   Authorization Type Medicare / [employer] (g-code needed)   Authorization Time Period renewal completed 07/30/16; renewal completed 09/28/16-11/27/16   Authorization - Visit Number 63   Authorization - Number of Visits 50   OT Start Time 1405   OT Stop Time 1445   OT Time Calculation (min) 40 min   Activity Tolerance Patient tolerated treatment well   Behavior During Therapy [facility] for tasks assessed/performed      Past Medical History:  Diagnosis Date  . Acute blood loss anemia   . Adjustment disorder with depressed mood   . CVA (cerebral vascular accident) ([place]) 02/03/2016   Linear infarct within the posterior limb of left internal capsule  . Diastolic CHF ([place])   . Dysphagia   . Dysphonia 02/20/2013  . Essential and other specified forms of tremor 02/20/2013  . HLD (hyperlipidemia)   . HTN (hypertension)   . OAB (overactive bladder)   . Osteoporosis   . Patient receiving subcutaneous heparin    For DVT prophylaxis 9/17  . Prediabetes   . Psoriasis   . Slow transit constipation   . Stroke (cerebrum) ([place]) 02/01/2016  . Trigeminal neuralgia 02/20/2013    Past Surgical History:  Procedure Laterality Date  . APPENDECTOMY  1940  . bladder tack    . CATARACT EXTRACTION Bilateral   . HEMORRHOID SURGERY  1960  . HUMERUS FRACTURE SURGERY    . LAPAROSCOPIC HYSTERECTOMY  2006  . torn rotator cuff    . WRIST FRACTURE SURGERY  2005   seconday shoulder 2001    There were no vitals filed for this visit.      Subjective  Assessment - 11/17/16 2017    Subjective  Pt tearful during session.  Pt expresses frustration with R hand spasticity and decr functional use.  "I think the Botox wore off"   Patient is accompained by: Family member   Pertinent History adjustment disorder with depressed mood, diastolic CHF, osteoporosis, hx of fall, essential tremor, HTN, prediabetes, trigeminal neuralgia   Limitations fall risk   Patient Stated Goals be able to write, fold clothes, do dishes   Currently in Pain? No/denies       Pt tearful and expressed frustration during session.  Pt given encouragement and reassurance.  Encouraged pt to keep performing HEP and perform frequent stretching/wt. Bearing,  but to take breaks when frustrated and then try again later.  Encouraged pt not to give up using RUE, but to just do what she can.  Also recommended incr splint wear--see pt education.  Wt. Bearing in modified quadraped with hands over blocks with body on arm movements with LUE reaching for decr tone and incr tricep ext and full composite wrist/finger ext.  Then, RUE reaching in this position to place checkers in connect 4 slots with min difficulty   Wt. Bearing through R hand on mat with body on arm movements/LUE functional reaching for decr tone with min cueing.    Functional low and mid-range reaching to grasp/release cylinder objects with min-max difficulty  today.  Pt with incr difficulty with spasticity for release today and needed stretching frequently to manage, but improved with repetition.  Pt/husband instructed in appropriate UE exercises at [facility] (rows, chest press, tricep ext, Nu-step using UEs, arm bike, pool activities, and only use low weights) and those to avoid (bicep curls, overhead UE exercises, heavy weights--and education provided on why these could incr risk for injury and incr spasticity).  Pt/husband verbalized understanding.           OT Education - 11/17/16 2017    Education Details importance of  stretching hand/wrist and/or wt. bearing frequently during functional tasks; taking breaks with frustration; Spasticity management techniques; recommended incr full composite ext splint wear to 2x/day and wearing resting hand splint at night as able   Person(s) Educated Patient;Spouse   Methods Explanation   Comprehension Verbalized understanding          OT Short Term Goals - 11/02/16 1118      OT SHORT TERM GOAL #5   Title Pt will be able to cut food mod I.--check updated STGs 10/28/16   Status Partially Met  08/24/16:  improved and is performing inconsistently;  09/28/16  continues to have difficulty.  10/20/16  pt able to cut soft food and was able to return demo use of rocker knife for simulated meat cutting in clinic     OT SHORT TERM GOAL #6   Title Pt will be able to write 1-2 sentences with 100% legibility.   Status Revised  08/25/16  approx 75% legibility today.  09/24/16  approx 85% legibility.  10/20/16  1 sentence with approx 95% legibility, 2nd sentence with approx 85% legibility, 11/02/16  not met     OT SHORT TERM GOAL #7   Title Pt will be able to reach to retrieve cylinder objects with RUE from at least 75* shoulder flex with 80% accuracy.   Status Achieved  10/20/16  met with approx 80% accuracy in sitting         OT Long Term Goals - 11/03/16 1030      OT LONG TERM GOAL #4   Title Pt will improve functional reaching and grasp/release as shown by improving score on box and blocks to at least 25.--check updated LTGs 11/27/16  Revised 07/09/16:  as pt met initial goal of improving by 10   Baseline 3 blocks   Period Weeks   Status Achieved  07/09/16  Met, and revised goal;  07/30/16  17 blocks.  09/24/16  21 blocks.  10/20/16:  25 blocks     OT LONG TERM GOAL #6   Title --------     OT LONG TERM GOAL #7   Title Pt will demo improved coordination for ADLs as shown by completing 9-hole peg test in 59mn or less.   Period Weeks   Status Achieved  08/25/16:  placed 4 pegs in within  236m, completed in 77m22m37.79sec.  09/24/16:   2mi54m1.97sec.  10/20/16  1min77m.38sec     OT LONG TERM GOAL #8   Title Pt will be able to reach to retrieve cylinder objects with RUE from at least 85* shoulder flex with 80% accuracy.   Period Weeks   Status Achieved  11/03/16:  met at approx this level     OT LONG TERM GOAL  #9   Baseline Pt will be able to manipulate 1 inch object in R hand with at least 75% accuracy.   Status On-going  Plan - 11/17/16 2020    Clinical Impression Statement Pt frustrated today and reports/demo incr spasticity.  Pt needed frequent stretching of hand and wt. bearing for grasp/release today, but improvement noted after spasticity management techniques.   Rehab Potential Good   OT Frequency 2x / week   OT Duration --  9 weeks   OT Treatment/Interventions Self-care/ADL training;Therapeutic exercise;DME and/or AE instruction;Therapist, nutritional;Therapeutic activities;Patient/family education;Balance training;Neuromuscular education;Electrical Stimulation;Moist Heat;Energy conservation;Passive range of motion;Therapeutic exercises;Splinting;Manual Therapy;Fluidtherapy;Ultrasound;Cryotherapy;Parrafin   Plan RUE functional use, grasp/release, neuro re-ed   OT Home Exercise Plan Education provided:  06/05/15 Initial HEP (table slides with light wt. bearing, tracing, and flipping cards); 06/22/16 supine ball ex and thumb opposition; 09/22/16  updates to HEP   Consulted and Agree with Plan of Care Patient   Family Member Consulted husband      Patient will benefit from skilled therapeutic intervention in order to improve the following deficits and impairments:  Decreased coordination, Decreased activity tolerance, Decreased knowledge of use of DME, Decreased strength, Impaired UE functional use, Impaired tone, Decreased range of motion, Decreased balance, Decreased mobility, Difficulty walking  Visit Diagnosis: Hemiplegia and hemiparesis  following cerebral infarction affecting right dominant side (HCC)  Other symptoms and signs involving the nervous system  Other lack of coordination  Unsteadiness on feet  Other abnormalities of gait and mobility  Muscle weakness (generalized)    Problem List Patient Active Problem List   Diagnosis Date Noted  . Slow transit constipation   . Acute blood loss anemia   . Adjustment disorder with depressed mood   . Cerebrovascular accident (CVA) ([place]) 02/03/2016  . Dysarthria, post-stroke   . Dysphagia, post-stroke   . Leukocytosis   . Prediabetes   . Right hemiparesis ([place])   . Cerebral infarction due to unspecified mechanism   . Other secondary hypertension   . Overactive bladder   . Diastolic dysfunction   . Hyperlipidemia   . Essential hypertension   . Essential tremor   . Stroke (cerebrum) ([place]) 02/01/2016  . Facial droop due to stroke 02/01/2016  . Essential and other specified forms of tremor 02/20/2013  . Dysphonia 02/20/2013  . Trigeminal neuralgia 02/20/2013    [facility] 11/17/2016, 8:24 PM  [facility] [address] [place], [place], [address] Phone: [phone]   Fax:  [phone]  Name: GLENDY BARSANTI MRN: [id number] Date of Birth: [birth year]   Vianne Bulls, OTR/L [facility] [address] [place], [place]  [address] [phone] phone [phone] 11/17/16 8:24 PM

## 2016-11-17 NOTE — Therapy (Signed)
[facility] [address] [place], [place], [address] Phone: [phone]   Fax:  [phone]  Physical Therapy Treatment  Patient Details  Name: Kelly [name] MRN: [id number] Date of Birth: August 23, 1927 Referring Provider: Cecille Rubin  Encounter Date: 11/17/2016      PT End of Session - 11/17/16 2003    Visit Number 42  G5   Number of Visits 52  recert 5/3 for 8 wks   Date for PT Re-Evaluation 11/20/16   Authorization Type Medicare/[employer] every 10th visit   Authorization Time Period 07/30/16- 09/25/16;   09/24/16 to 11/20/16   PT Start Time 1315   PT Stop Time 1356   PT Time Calculation (min) 41 min   Activity Tolerance Patient tolerated treatment well   Behavior During Therapy [facility] for tasks assessed/performed      Past Medical History:  Diagnosis Date  . Acute blood loss anemia   . Adjustment disorder with depressed mood   . CVA (cerebral vascular accident) ([place]) 02/03/2016   Linear infarct within the posterior limb of left internal capsule  . Diastolic CHF ([place])   . Dysphagia   . Dysphonia 02/20/2013  . Essential and other specified forms of tremor 02/20/2013  . HLD (hyperlipidemia)   . HTN (hypertension)   . OAB (overactive bladder)   . Osteoporosis   . Patient receiving subcutaneous heparin    For DVT prophylaxis 9/17  . Prediabetes   . Psoriasis   . Slow transit constipation   . Stroke (cerebrum) ([place]) 02/01/2016  . Trigeminal neuralgia 02/20/2013    Past Surgical History:  Procedure Laterality Date  . APPENDECTOMY  1940  . bladder tack    . CATARACT EXTRACTION Bilateral   . HEMORRHOID SURGERY  1960  . HUMERUS FRACTURE SURGERY    . LAPAROSCOPIC HYSTERECTOMY  2006  . torn rotator cuff    . WRIST FRACTURE SURGERY  2005   seconday shoulder 2001    There were no vitals filed for this visit.      Subjective Assessment - 11/17/16 1319    Subjective Missed one appt last week due to  stomach not feeling well.    Patient is accompained by: Family member   Patient Stated Goals Pt's goal for therapy is to walk again.   Currently in Pain? No/denies                         [facility] Adult PT Treatment/Exercise - 11/17/16 0001      Transfers   Transfers Sit to Stand;Stand to Sit   Sit to Stand 5: Supervision;With upper extremity assist   Sit to Stand Details (indicate cue type and reason) initiates reaching for walker to pull up on, but then self-vorrects   Stand to Sit 6: Modified independent (Device/Increase time)   Comments stood x 10 from ~16 inch surface with min assist      Ambulation/Gait   Ambulation/Gait Yes   Ambulation/Gait Assistance 5: Supervision   Ambulation/Gait Assistance Details for safety; no cues needed; better uprgiht posture   Ambulation Distance (Feet) 100 Feet  50, 70, 40   Assistive device Rolling walker   Gait Pattern Step-through pattern;Decreased stride length;Decreased dorsiflexion - right;Right genu recurvatum;Poor foot clearance - right   Ambulation Surface Level     Knee/Hip Exercises: Seated   Hamstring Curl Strengthening;Both;2 sets;10 reps  green band             Balance  Exercises - 11/17/16 1958      Balance Exercises: Standing   Standing Eyes Opened Wide (BOA);Foam/compliant surface;Head turns;30 secs  black foam beams   Step Ups Forward;Lateral;4 inch;UE support 2  and backwards   Balance Beam blue horizontal x 30 sec no UE support; head turns, step down/up   Retro Gait Upper extremity support;4 reps  LUE support in //bars             PT Short Term Goals - 10/22/16 1519      PT SHORT TERM GOAL #1   Title ---     PT SHORT TERM GOAL #2   Title ---     PT SHORT TERM GOAL #4   Title ---     PT SHORT TERM GOAL #7   Title ----------------------------------------------------------------------------------------------------     PT SHORT TERM GOAL #8   Title Patient will demonstrate sit to  stand 8 of 10 times with correct sequencing for safety with supervision but no cues. (TARGET for STGs 10-23-16)   Time 4   Period Weeks   Status Achieved     PT SHORT TERM GOAL #9   TITLE Patient will ascend/descend curb with RW with minguard assist (no physical assist, but close proximity) without verbal cues.    Baseline met 5/29   Time 4   Period Weeks   Status Achieved           PT Long Term Goals - 09/24/16 1451      PT LONG TERM GOAL #1   Title Pt/family will verbalize understanding of fall prevention in the home environment.  TARGET 07/31/16 (NEW TARGET DATE FOR ALL CURRENT LTGS 09/25/16)  5/4 MET   Time 8   Period Weeks   Status Achieved     PT LONG TERM GOAL #2   Title Pt will improve Berg Balance score to at least 31/56 for decreased fall risk.  09/24/16   32/56   Time 8   Period Weeks   Status Achieved     PT LONG TERM GOAL #3   Title Pt will improve TUG score to less than or equal to 45 seconds for decreased fall risk.     Baseline 33.41 sec 07/28/16  5/3 23.21 sec   Time 8   Period Weeks   Status Achieved     PT LONG TERM GOAL #4   Title Pt will improve gait velocity to at least 1.8 ft/sec for improved gait efficiency and safety.   Baseline 1.56 ft/sec with RW 07/28/16;  09/24/16  2.23 ft/sec   Time 8   Period Weeks   Status Achieved     PT LONG TERM GOAL #5   Title Pt will ambulate at least 800 ft indoor and outdoor surfaces, using RW, modified independently, for improved outdoor and community. (NEW TARGET date 11-20-16)   Baseline 5/2 did 600 ft   Time 8   Period Weeks   Status On-going     Additional Long Term Goals   Additional Long Term Goals Yes     PT LONG TERM GOAL #6   Title Pt will improve Neuro QOL score on FOTO by at least 20% for improved functional mobility. (NEW TARGET date 11-20-16)   Time 8   Period Weeks   Status On-going     PT LONG TERM GOAL #7   Title Patient will increase Berg score to 39/56 to indicate lesser fall risk. (TARGET date  11-20-16)   Time 8   Period  Weeks   Status New     PT LONG TERM GOAL #8   Title Patient will increase her gait velocity to >=2.45 ft/sec indicative of increased safety with community ambulation. (TARGET date 11-20-16)   Time [place] - 11/17/16 2004    Clinical Impression Statement Session focused on gait, balance, transfer, and strength training. Discussed upcoming discharge and patient [facility].    Rehab Potential Good   PT Frequency 2x / week   PT Duration 8 weeks   PT Treatment/Interventions ADLs/Self Care Home Management;Functional mobility training;Stair training;Gait training;Patient/family education;Neuromuscular re-education;Balance training;Therapeutic exercise;Therapeutic activities;DME Instruction   PT Next Visit Plan check LTGs; re-print balance exercises she is to continue upon discharge (pt/husband request)   Consulted and Agree with Plan of Care Patient;Family member/caregiver   Family Member Consulted Husband      Patient will benefit from skilled therapeutic intervention in order to improve the following deficits and impairments:  Abnormal gait, Decreased balance, Decreased cognition, Decreased knowledge of use of DME, Decreased range of motion, Decreased safety awareness, Decreased strength  Visit Diagnosis: Other abnormalities of gait and mobility  Muscle weakness (generalized)  Unsteadiness on feet     Problem List Patient Active Problem List   Diagnosis Date Noted  . Slow transit constipation   . Acute blood loss anemia   . Adjustment disorder with depressed mood   . Cerebrovascular accident (CVA) ([place]) 02/03/2016  . Dysarthria, post-stroke   . Dysphagia, post-stroke   . Leukocytosis   . Prediabetes   . Right hemiparesis ([place])   . Cerebral infarction due to unspecified mechanism   . Other secondary hypertension   .  Overactive bladder   . Diastolic dysfunction   . Hyperlipidemia   . Essential hypertension   . Essential tremor   . Stroke (cerebrum) ([place]) 02/01/2016  . Facial droop due to stroke 02/01/2016  . Essential and other specified forms of tremor 02/20/2013  . Dysphonia 02/20/2013  . Trigeminal neuralgia 02/20/2013    Rexanne Mano, PT 11/17/2016, 8:09 PM  [facility] [address] [place], [place], [address] Phone: [phone]   Fax:  [phone]  Name: Kelly [name] MRN: [id number] Date of Birth: 1927-10-23

## 2016-11-19 ENCOUNTER — Ambulatory Visit: Payer: Medicare Other | Admitting: Physical Therapy

## 2016-11-19 ENCOUNTER — Ambulatory Visit: Payer: Medicare Other | Admitting: Occupational Therapy

## 2016-11-19 ENCOUNTER — Encounter: Payer: Medicare Other | Admitting: Occupational Therapy

## 2016-11-19 DIAGNOSIS — R2689 Other abnormalities of gait and mobility: Secondary | ICD-10-CM

## 2016-11-19 DIAGNOSIS — R2681 Unsteadiness on feet: Secondary | ICD-10-CM | POA: Diagnosis not present

## 2016-11-19 DIAGNOSIS — M6281 Muscle weakness (generalized): Secondary | ICD-10-CM | POA: Diagnosis not present

## 2016-11-19 DIAGNOSIS — R29818 Other symptoms and signs involving the nervous system: Secondary | ICD-10-CM

## 2016-11-19 DIAGNOSIS — I69351 Hemiplegia and hemiparesis following cerebral infarction affecting right dominant side: Secondary | ICD-10-CM | POA: Diagnosis not present

## 2016-11-19 DIAGNOSIS — R278 Other lack of coordination: Secondary | ICD-10-CM

## 2016-11-19 NOTE — Therapy (Signed)
[facility] [address] [place], [place], [address] Phone: [phone]   Fax:  [phone]  Occupational Therapy Treatment  Patient Details  Name: Kelly [name] MRN: [id number] Date of Birth: 1928/03/02 Referring Provider: Cecille Rubin, NP  Encounter Date: 11/19/2016      OT End of Session - 11/19/16 1331    Visit Number 43   Number of Visits 46  30+16=46   Date for OT Re-Evaluation 11/27/16   Authorization Type Medicare / [employer] (g-code needed)   Authorization Time Period renewal completed 07/30/16; renewal completed 09/28/16-11/27/16   Authorization - Visit Number 51   Authorization - Number of Visits 50   OT Start Time 1321   OT Stop Time 1400   OT Time Calculation (min) 39 min   Activity Tolerance Patient tolerated treatment well   Behavior During Therapy [facility] for tasks assessed/performed      Past Medical History:  Diagnosis Date  . Acute blood loss anemia   . Adjustment disorder with depressed mood   . CVA (cerebral vascular accident) ([place]) 02/03/2016   Linear infarct within the posterior limb of left internal capsule  . Diastolic CHF ([place])   . Dysphagia   . Dysphonia 02/20/2013  . Essential and other specified forms of tremor 02/20/2013  . HLD (hyperlipidemia)   . HTN (hypertension)   . OAB (overactive bladder)   . Osteoporosis   . Patient receiving subcutaneous heparin    For DVT prophylaxis 9/17  . Prediabetes   . Psoriasis   . Slow transit constipation   . Stroke (cerebrum) ([place]) 02/01/2016  . Trigeminal neuralgia 02/20/2013    Past Surgical History:  Procedure Laterality Date  . APPENDECTOMY  1940  . bladder tack    . CATARACT EXTRACTION Bilateral   . HEMORRHOID SURGERY  1960  . HUMERUS FRACTURE SURGERY    . LAPAROSCOPIC HYSTERECTOMY  2006  . torn rotator cuff    . WRIST FRACTURE SURGERY  2005   seconday shoulder 2001    There were no vitals filed for this visit.      Subjective  Assessment - 11/19/16 1329    Subjective  Pt reports that she is doing better today and was able to eat with RUE this morning.   Patient is accompained by: Family member   Pertinent History adjustment disorder with depressed mood, diastolic CHF, osteoporosis, hx of fall, essential tremor, HTN, prediabetes, trigeminal neuralgia   Limitations fall risk   Patient Stated Goals be able to write, fold clothes, do dishes   Currently in Pain? No/denies        Placing various-sized pegs in arc pegboard with min-mod difficulty and multiple rest breaks (due to shoulder fatigue) and to stretch R hand.    Recommended that pt perform bathing with only min A from husband to drive back.  Pt reports that she is willing, but daughter wants her to have help.  Pt reports that she/husband performed when aide was unable to come (assist from husband to dry off).  Recommended pt try drying front off with hand towel vs. Larger towel for incr ease.  Recommended ways to discuss with dtr. For decr assistance emphasizing incr activity will be positive for pt.  Arm bike x5 min level 1 for reciprocal movements and conditioning without rest.  Table slides for shoulder, elbow, wrist, finger extension with min cues and visual target for incr stretch.  Frequent PROM wrist/fingers in composite ext. By pt and OT in prep  for functional tasks.  Flipping large cards with min facilitation for compensation and cueing for full finger extension/supination.                     OT Education - 11/19/16 1724    Education Details Avoiding shoulder hike/IR  for better positioning, normal movement patterns, and to reduce fatigue   Person(s) Educated Patient;Spouse   Methods Explanation;Demonstration;Verbal cues   Comprehension Verbalized understanding;Returned demonstration;Verbal cues required          OT Short Term Goals - 11/02/16 1118      OT SHORT TERM GOAL #5   Title Pt will be able to cut food mod  I.--check updated STGs 10/28/16   Status Partially Met  08/24/16:  improved and is performing inconsistently;  09/28/16  continues to have difficulty.  10/20/16  pt able to cut soft food and was able to return demo use of rocker knife for simulated meat cutting in clinic     OT SHORT TERM GOAL #6   Title Pt will be able to write 1-2 sentences with 100% legibility.   Status Revised  08/25/16  approx 75% legibility today.  09/24/16  approx 85% legibility.  10/20/16  1 sentence with approx 95% legibility, 2nd sentence with approx 85% legibility, 11/02/16  not met     OT SHORT TERM GOAL #7   Title Pt will be able to reach to retrieve cylinder objects with RUE from at least 75* shoulder flex with 80% accuracy.   Status Achieved  10/20/16  met with approx 80% accuracy in sitting           OT Long Term Goals - 11/03/16 1030      OT LONG TERM GOAL #4   Title Pt will improve functional reaching and grasp/release as shown by improving score on box and blocks to at least 25.--check updated LTGs 11/27/16  Revised 07/09/16:  as pt met initial goal of improving by 10   Baseline 3 blocks   Period Weeks   Status Achieved  07/09/16  Met, and revised goal;  07/30/16  17 blocks.  09/24/16  21 blocks.  10/20/16:  25 blocks     OT LONG TERM GOAL #6   Title --------     OT LONG TERM GOAL #7   Title Pt will demo improved coordination for ADLs as shown by completing 9-hole peg test in 20mn or less.   Period Weeks   Status Achieved  08/25/16:  placed 4 pegs in within 230m, completed in 55m65m37.79sec.  09/24/16:   2mi47m1.97sec.  10/20/16  1min26m.38sec     OT LONG TERM GOAL #8   Title Pt will be able to reach to retrieve cylinder objects with RUE from at least 85* shoulder flex with 80% accuracy.   Period Weeks   Status Achieved  11/03/16:  met at approx this level     OT LONG TERM GOAL  #9   Baseline Pt will be able to manipulate 1 inch object in R hand with at least 75% accuracy.   Status On-going                Plan - 11/19/16 1332    Clinical Impression Statement Pt continues to need frequent stretching of R hand and rest breaks for shoulder compensation with RUE functional use, but improved today than earlier this week.   Rehab Potential Good   OT Frequency 2x / week   OT Duration --  9 weeks   OT Treatment/Interventions Self-care/ADL training;Therapeutic exercise;DME and/or AE instruction;Therapist, nutritional;Therapeutic activities;Patient/family education;Balance training;Neuromuscular education;Electrical Stimulation;Moist Heat;Energy conservation;Passive range of motion;Therapeutic exercises;Splinting;Manual Therapy;Fluidtherapy;Ultrasound;Cryotherapy;Parrafin   Plan check remaing goals and anticipate d/c next week   OT Home Exercise Plan Education provided:  06/05/15 Initial HEP (table slides with light wt. bearing, tracing, and flipping cards); 06/22/16 supine ball ex and thumb opposition; 09/22/16  updates to HEP   Consulted and Agree with Plan of Care Patient   Family Member Consulted husband      Patient will benefit from skilled therapeutic intervention in order to improve the following deficits and impairments:  Decreased coordination, Decreased activity tolerance, Decreased knowledge of use of DME, Decreased strength, Impaired UE functional use, Impaired tone, Decreased range of motion, Decreased balance, Decreased mobility, Difficulty walking  Visit Diagnosis: Hemiplegia and hemiparesis following cerebral infarction affecting right dominant side ([place])  Other symptoms and signs involving the nervous system  Other lack of coordination  Unsteadiness on feet  Other abnormalities of gait and mobility  Muscle weakness (generalized)    Problem List Patient Active Problem List   Diagnosis Date Noted  . Slow transit constipation   . Acute blood loss anemia   . Adjustment disorder with depressed mood   . Cerebrovascular accident (CVA) ([place]) 02/03/2016  . Dysarthria,  post-stroke   . Dysphagia, post-stroke   . Leukocytosis   . Prediabetes   . Right hemiparesis ([place])   . Cerebral infarction due to unspecified mechanism   . Other secondary hypertension   . Overactive bladder   . Diastolic dysfunction   . Hyperlipidemia   . Essential hypertension   . Essential tremor   . Stroke (cerebrum) ([place]) 02/01/2016  . Facial droop due to stroke 02/01/2016  . Essential and other specified forms of tremor 02/20/2013  . Dysphonia 02/20/2013  . Trigeminal neuralgia 02/20/2013    [facility] 11/19/2016, 5:25 PM  [facility] [address] [place], [place], [address] Phone: [phone]   Fax:  [phone]  Name: Kelly [name] MRN: [id number] Date of Birth: [birth year]   Vianne Bulls, OTR/L [facility] [address] [place], [place]  [address] [phone] phone [phone] 11/19/16 5:25 PM

## 2016-11-19 NOTE — Therapy (Signed)
[facility] [address] [place], [place], [address] Phone: [phone]   Fax:  [phone]  Physical Therapy Treatment and Discharge Summary  Patient Details  Name: Kelly [name] MRN: [id number] Date of Birth: 1928-01-30 Referring Provider: Cecille Rubin  Encounter Date: 11/19/2016      PT End of Session - 11/19/16 2019    Visit Number 42  G5   Number of Visits 52  recert 5/3 for 8 wks   Date for PT Re-Evaluation 11/20/16   Authorization Type Medicare/[employer] every 10th visit   Authorization Time Period 07/30/16- 09/25/16;   09/24/16 to 11/20/16   PT Start Time 1403   PT Stop Time 1445   PT Time Calculation (min) 42 min   Activity Tolerance Patient tolerated treatment well   Behavior During Therapy [facility] for tasks assessed/performed      Past Medical History:  Diagnosis Date  . Acute blood loss anemia   . Adjustment disorder with depressed mood   . CVA (cerebral vascular accident) ([place]) 02/03/2016   Linear infarct within the posterior limb of left internal capsule  . Diastolic CHF ([place])   . Dysphagia   . Dysphonia 02/20/2013  . Essential and other specified forms of tremor 02/20/2013  . HLD (hyperlipidemia)   . HTN (hypertension)   . OAB (overactive bladder)   . Osteoporosis   . Patient receiving subcutaneous heparin    For DVT prophylaxis 9/17  . Prediabetes   . Psoriasis   . Slow transit constipation   . Stroke (cerebrum) ([place]) 02/01/2016  . Trigeminal neuralgia 02/20/2013    Past Surgical History:  Procedure Laterality Date  . APPENDECTOMY  1940  . bladder tack    . CATARACT EXTRACTION Bilateral   . HEMORRHOID SURGERY  1960  . HUMERUS FRACTURE SURGERY    . LAPAROSCOPIC HYSTERECTOMY  2006  . torn rotator cuff    . WRIST FRACTURE SURGERY  2005   seconday shoulder 2001    There were no vitals filed for this visit.      Subjective Assessment - 11/19/16 2004    Subjective Feeling well.  No changes   Patient is accompained by: Family member   Patient Stated Goals Pt's goal for therapy is to walk again.                         [facility] Adult PT Treatment/Exercise - 11/19/16 0001      Transfers   Transfers Sit to Stand;Stand to Sit   Sit to Stand 5: Supervision;With upper extremity assist   Stand to Sit 6: Modified independent (Device/Increase time);With upper extremity assist   Comments able to sit without use of UEs     Ambulation/Gait   Ambulation/Gait Yes   Ambulation/Gait Assistance 5: Supervision   Ambulation/Gait Assistance Details for safety as pt's right knee continues to be weak   Ambulation Distance (Feet) 100 Feet  80, 60   Assistive device Rolling walker   Gait Pattern Step-through pattern;Decreased stride length;Decreased dorsiflexion - right;Right genu recurvatum;Poor foot clearance - right;Right foot flat   Ambulation Surface Level;Indoor   Gait velocity --  13.35s= 2.45 fast     Berg Balance Test   Sit to Stand Able to stand  independently using hands   Standing Unsupported Able to stand safely 2 minutes   Sitting with Back Unsupported but Feet Supported on Floor or Stool Able to sit safely and securely 2 minutes  Stand to Sit Sits safely with minimal use of hands   Transfers Able to transfer safely, definite need of hands   Standing Unsupported with Eyes Closed Able to stand 10 seconds safely   Standing Ubsupported with Feet Together Needs help to attain position but able to stand for 30 seconds with feet together   From Standing, Reach Forward with Outstretched Arm Reaches forward but needs supervision   From Standing Position, Pick up Object from [place] to pick up shoe, needs supervision   From Standing Position, Turn to Look Behind Over each Shoulder Turn sideways only but maintains balance   Turn 360 Degrees Needs assistance while turning   Standing Unsupported, Alternately Place Feet on Step/Stool Able to complete >2  steps/needs minimal assist   Standing Unsupported, One Foot in Front Able to take small step independently and hold 30 seconds   Standing on One Leg Unable to try or needs assist to prevent fall   Total Score 32                PT Education - 11/19/16 2009    Education provided Yes   Education Details Continue balance ex's at home and do fitness/weightlifting at the [employer]) Educated Patient;Spouse   Methods Demonstration;Explanation   Comprehension Verbalized understanding;Returned demonstration;Verbal cues required;Need further instruction          PT Short Term Goals - 10/22/16 1519      PT SHORT TERM GOAL #1   Title ---     PT SHORT TERM GOAL #2   Title ---     PT SHORT TERM GOAL #4   Title ---     PT SHORT TERM GOAL #7   Title ----------------------------------------------------------------------------------------------------     PT SHORT TERM GOAL #8   Title Patient will demonstrate sit to stand 8 of 10 times with correct sequencing for safety with supervision but no cues. (TARGET for STGs 10-23-16)   Time 4   Period Weeks   Status Achieved     PT SHORT TERM GOAL #9   TITLE Patient will ascend/descend curb with RW with minguard assist (no physical assist, but close proximity) without verbal cues.    Baseline met 5/29   Time 4   Period Weeks   Status Achieved           PT Long Term Goals - 11/19/16 2011      PT LONG TERM GOAL #1   Title Pt/family will verbalize understanding of fall prevention in the home environment.  TARGET 07/31/16 (NEW TARGET DATE FOR ALL CURRENT LTGS 09/25/16)  5/4 MET   Time 8   Period Weeks   Status Achieved     PT LONG TERM GOAL #2   Title Pt will improve Berg Balance score to at least 31/56 for decreased fall risk.  09/24/16   32/56   Time 8   Period Weeks   Status Achieved     PT LONG TERM GOAL #3   Title Pt will improve TUG score to less than or equal to 45 seconds for decreased fall risk.     Baseline 33.41 sec  07/28/16  5/3 23.21 sec   Time 8   Period Weeks   Status Achieved     PT LONG TERM GOAL #4   Title Pt will improve gait velocity to at least 1.8 ft/sec for improved gait efficiency and safety.   Baseline 1.56 ft/sec with RW 07/28/16;  09/24/16  2.23 ft/sec  Time 8   Period Weeks   Status Achieved     PT LONG TERM GOAL #5   Title Pt will ambulate at least 800 ft indoor and outdoor surfaces, using RW, modified independently, for improved outdoor and community. (NEW TARGET date 11-20-16)   Baseline 5/2 did 600 ft   Time 8   Period Weeks   Status On-going     PT LONG TERM GOAL #6   Title Pt will improve Neuro QOL score on FOTO by at least 20% for improved functional mobility. (NEW TARGET date 11-20-16)   Baseline 05/28/16 FS 32; 2016-12-11 FS 45   Time 8   Period Weeks   Status Achieved     PT LONG TERM GOAL #7   Title Patient will increase Berg score to 39/56 to indicate lesser fall risk. (TARGET date 11-20-16)   Time 8   Period Weeks   Status Not Met     PT LONG TERM GOAL #8   Title Patient will increase her gait velocity to >=2.45 ft/sec indicative of increased safety with community ambulation. (TARGET date 11-20-16)   Time 8   Period Weeks   Status Achieved               Plan - 2016/12/11 07/27/36    Clinical Impression Statement Patient met 3 of 4 most recent LTGs. Completed re-assessing status now compared to January 2018 with pt consitently improving her gait velocity.  Overall she improved her Berg score from 21 to 32 out of 56. She plans to continue to do her balance exercises at home [facility] Patient prepared for discharge from PT.     Consulted and Agree with Plan of Care Patient;Family member/caregiver   Family Member Consulted Husband      Patient will benefit from skilled therapeutic intervention in order to improve the following deficits and impairments:  Abnormal gait, Decreased balance, Decreased cognition, Decreased knowledge of use of DME,  Decreased range of motion, Decreased safety awareness, Decreased strength  Visit Diagnosis: Other symptoms and signs involving the nervous system  Unsteadiness on feet       G-Codes - 2016-12-11 July 27, 2025    Functional Assessment Tool Used (Outpatient Only) 10/2816 Berg 32/56   gait velocity 2.23 ft/sec;     Functional Limitation Mobility: Walking and moving around   Mobility: Walking and Moving Around Goal Status [phone]) At least 20 percent but less than 40 percent impaired, limited or restricted   Mobility: Walking and Moving Around Discharge Status [phone]) At least 20 percent but less than 40 percent impaired, limited or restricted      Problem List Patient Active Problem List   Diagnosis Date Noted  . Slow transit constipation   . Acute blood loss anemia   . Adjustment disorder with depressed mood   . Cerebrovascular accident (CVA) ([place]) 02/03/2016  . Dysarthria, post-stroke   . Dysphagia, post-stroke   . Leukocytosis   . Prediabetes   . Right hemiparesis ([place])   . Cerebral infarction due to unspecified mechanism   . Other secondary hypertension   . Overactive bladder   . Diastolic dysfunction   . Hyperlipidemia   . Essential hypertension   . Essential tremor   . Stroke (cerebrum) ([place]) 02/01/2016  . Facial droop due to stroke 02/01/2016  . Essential and other specified forms of tremor 02/20/2013  . Dysphonia 02/20/2013  . Trigeminal neuralgia 02/20/2013  PHYSICAL THERAPY DISCHARGE SUMMARY  Visits from Start of  Care: 42  Current functional level related to goals / functional outcomes: Ambulates with RW with supervision   Remaining deficits: RLE weakness and decr balance   Education / Equipment: HEP  Plan: Patient agrees to discharge.  Patient goals were partially met. Patient is being discharged due to being pleased with the current functional level.  ?????       Rexanne Mano, PT 11/19/2016, 8:47 PM  [facility] [address] [place], [place], [address] Phone: [phone]   Fax:  [phone]  Name: Kelly [name] MRN: [id number] Date of Birth: [birth year]

## 2016-11-23 ENCOUNTER — Ambulatory Visit: Payer: Medicare Other | Attending: Nurse Practitioner | Admitting: Occupational Therapy

## 2016-11-23 DIAGNOSIS — I69351 Hemiplegia and hemiparesis following cerebral infarction affecting right dominant side: Secondary | ICD-10-CM | POA: Insufficient documentation

## 2016-11-23 DIAGNOSIS — R2689 Other abnormalities of gait and mobility: Secondary | ICD-10-CM | POA: Diagnosis not present

## 2016-11-23 DIAGNOSIS — R29818 Other symptoms and signs involving the nervous system: Secondary | ICD-10-CM | POA: Diagnosis not present

## 2016-11-23 DIAGNOSIS — M6281 Muscle weakness (generalized): Secondary | ICD-10-CM | POA: Insufficient documentation

## 2016-11-23 DIAGNOSIS — R278 Other lack of coordination: Secondary | ICD-10-CM | POA: Insufficient documentation

## 2016-11-23 DIAGNOSIS — R2681 Unsteadiness on feet: Secondary | ICD-10-CM | POA: Insufficient documentation

## 2016-11-23 NOTE — Therapy (Signed)
[facility] [address] [place], [place], [address] Phone: [phone]   Fax:  [phone]  Occupational Therapy Treatment  Patient Details  Name: NIKERIA KALMAN MRN: [id number] Date of Birth: [birth year] Referring Provider: Cecille Rubin, NP  Encounter Date: 11/23/2016      OT End of Session - 11/23/16 1108    Visit Number 44   Number of Visits 97  30+16=46   Date for OT Re-Evaluation 11/27/16   Authorization Type Medicare / [employer] (g-code needed)   Authorization Time Period renewal completed 07/30/16; renewal completed 09/28/16-11/27/16   Authorization - Visit Number 73   Authorization - Number of Visits 50   OT Start Time 1106   OT Stop Time 1145   OT Time Calculation (min) 39 min   Activity Tolerance Patient tolerated treatment well   Behavior During Therapy [facility] for tasks assessed/performed      Past Medical History:  Diagnosis Date  . Acute blood loss anemia   . Adjustment disorder with depressed mood   . CVA (cerebral vascular accident) ([place]) 02/03/2016   Linear infarct within the posterior limb of left internal capsule  . Diastolic CHF ([place])   . Dysphagia   . Dysphonia 02/20/2013  . Essential and other specified forms of tremor 02/20/2013  . HLD (hyperlipidemia)   . HTN (hypertension)   . OAB (overactive bladder)   . Osteoporosis   . Patient receiving subcutaneous heparin    For DVT prophylaxis 9/17  . Prediabetes   . Psoriasis   . Slow transit constipation   . Stroke (cerebrum) ([place]) 02/01/2016  . Trigeminal neuralgia 02/20/2013    Past Surgical History:  Procedure Laterality Date  . APPENDECTOMY  1940  . bladder tack    . CATARACT EXTRACTION Bilateral   . HEMORRHOID SURGERY  1960  . HUMERUS FRACTURE SURGERY    . LAPAROSCOPIC HYSTERECTOMY  2006  . torn rotator cuff    . WRIST FRACTURE SURGERY  2005   seconday shoulder 2001    There were no vitals filed for this visit.      Subjective  Assessment - 11/23/16 1107    Subjective  I did good.  I don't know if I'll do that good again.   Patient is accompained by: Family member   Pertinent History adjustment disorder with depressed mood, diastolic CHF, osteoporosis, hx of fall, essential tremor, HTN, prediabetes, trigeminal neuralgia   Limitations fall risk   Patient Stated Goals be able to write, fold clothes, do dishes   Currently in Pain? No/denies       In sitting, AAROM shoulder flex with ball with BUEs with min cueing for posture.  Wt. Bearing with RUE on mat with body on arm movements with min cueing.  Functional reaching to grasp/release cylinder objects with min difficulty (mid-level).  Writing name and 2 sentences with 100% legibility 1st sentence and approx 85% 2nd sentence with decr in size/legibility.  Reviewed importance of stopping to stretch and rest frequently when using RUE if having difficulty.  Continue wt. Bearing and wearing splint.  Pt/husband verbalized understanding.  Low range functional reaching to place cylinder pegs in pegboard with min difficulty with in-hand manipulation, min cues.  Placing medium pegs in pegboard with min-mod difficulty, minimal manipulation in hand used.  Arm bike x23mn level 1 for reciprocal movement/conditioning without rest.  OT Short Term Goals - 11/23/16 1129      OT SHORT TERM GOAL #5   Title Pt will be able to cut food mod I.--check updated STGs 10/28/16   Status Partially Met  08/24/16:  improved and is performing inconsistently;  09/28/16  continues to have difficulty.  10/20/16  pt able to cut soft food and was able to return demo use of rocker knife for simulated meat cutting in clinic 11/23/16  continued difficulty     OT SHORT TERM GOAL #6   Title Pt will be able to write 1-2 sentences with 100% legibility.   Status Partially Met  08/25/16  approx 75% legibility today.  09/24/16  approx 85% legibility.  10/20/16  1 sentence  with approx 95% legibility, 2nd sentence with approx 85% legibility, 11/02/16  not met.  11/23/16  Partially met.  Not consistent,  (but met today for 1st sentence)     OT SHORT TERM GOAL #7   Title Pt will be able to reach to retrieve cylinder objects with RUE from at least 75* shoulder flex with 80% accuracy.   Status Achieved  10/20/16  met with approx 80% accuracy in sitting           OT Long Term Goals - 11/23/16 1129      OT LONG TERM GOAL #4   Title Pt will improve functional reaching and grasp/release as shown by improving score on box and blocks to at least 25.--check updated LTGs 11/27/16  Revised 07/09/16:  as pt met initial goal of improving by 10   Baseline 3 blocks   Period Weeks   Status Achieved  07/09/16  Met, and revised goal;  07/30/16  17 blocks.  09/24/16  21 blocks.  10/20/16:  25 blocks     OT LONG TERM GOAL #6   Title --------     OT LONG TERM GOAL #7   Title Pt will demo improved coordination for ADLs as shown by completing 9-hole peg test in 450mn or less.   Period Weeks   Status Achieved  08/25/16:  placed 4 pegs in within 262m, completed in 50m28m37.79sec.  09/24/16:   2mi38m1.97sec.  10/20/16  1min88m.38sec     OT LONG TERM GOAL #8   Title Pt will be able to reach to retrieve cylinder objects with RUE from at least 85* shoulder flex with 80% accuracy.   Period Weeks   Status Achieved  11/03/16:  met at approx this level     OT LONG TERM GOAL  #9   Baseline Pt will be able to manipulate 1 inch object in R hand with at least 75% accuracy.   Status On-going               Plan - 11/23/16 1144    Clinical Impression Statement Pt demo good grasp/release of objects today, but minimal in-hand manipulation.     Rehab Potential Good   OT Frequency 2x / week   OT Duration --  9 weeks   OT Treatment/Interventions Self-care/ADL training;Therapeutic exercise;DME and/or AE instruction;FunctTherapist, nutritionalapeutic activities;Patient/family education;Balance  training;Neuromuscular education;Electrical Stimulation;Moist Heat;Energy conservation;Passive range of motion;Therapeutic exercises;Splinting;Manual Therapy;Fluidtherapy;Ultrasound;Cryotherapy;Parrafin   Plan anticipate d/c next visit.   OT Home Exercise Plan Education provided:  06/05/15 Initial HEP (table slides with light wt. bearing, tracing, and flipping cards); 06/22/16 supine ball ex and thumb opposition; 09/22/16  updates to HEP   Consulted and Agree with Plan of Care Patient   Family Member  Consulted husband      Patient will benefit from skilled therapeutic intervention in order to improve the following deficits and impairments:  Decreased coordination, Decreased activity tolerance, Decreased knowledge of use of DME, Decreased strength, Impaired UE functional use, Impaired tone, Decreased range of motion, Decreased balance, Decreased mobility, Difficulty walking  Visit Diagnosis: Hemiplegia and hemiparesis following cerebral infarction affecting right dominant side ([facility])  Other symptoms and signs involving the nervous system  Other lack of coordination  Unsteadiness on feet  Other abnormalities of gait and mobility    Problem List Patient Active Problem List   Diagnosis Date Noted  . Slow transit constipation   . Acute blood loss anemia   . Adjustment disorder with depressed mood   . Cerebrovascular accident (CVA) ([facility]) 02/03/2016  . Dysarthria, post-stroke   . Dysphagia, post-stroke   . Leukocytosis   . Prediabetes   . Right hemiparesis ([facility])   . Cerebral infarction due to unspecified mechanism   . Other secondary hypertension   . Overactive bladder   . Diastolic dysfunction   . Hyperlipidemia   . Essential hypertension   . Essential tremor   . Stroke (cerebrum) ([facility]) 02/01/2016  . Facial droop due to stroke 02/01/2016  . Essential and other specified forms of tremor 02/20/2013  . Dysphonia 02/20/2013  . Trigeminal neuralgia 02/20/2013     FREEMAN,ANGELA 11/23/2016, 1:11 PM  [facility] [address] [place], [place], [address] Phone: [phone]   Fax:  [phone]  Name: Evette H Mendell MRN: [id number] Date of Birth: 09/30/1927   Angela Freeman, OTR/L [facility] [address] [place], [place]  [address] [phone] phone [phone] 11/23/16 1:12 PM    

## 2016-11-26 ENCOUNTER — Ambulatory Visit: Payer: Medicare Other | Admitting: Occupational Therapy

## 2016-11-26 DIAGNOSIS — M6281 Muscle weakness (generalized): Secondary | ICD-10-CM | POA: Diagnosis not present

## 2016-11-26 DIAGNOSIS — R2681 Unsteadiness on feet: Secondary | ICD-10-CM | POA: Diagnosis not present

## 2016-11-26 DIAGNOSIS — R2689 Other abnormalities of gait and mobility: Secondary | ICD-10-CM

## 2016-11-26 DIAGNOSIS — R29818 Other symptoms and signs involving the nervous system: Secondary | ICD-10-CM | POA: Diagnosis not present

## 2016-11-26 DIAGNOSIS — R278 Other lack of coordination: Secondary | ICD-10-CM | POA: Diagnosis not present

## 2016-11-26 DIAGNOSIS — I69351 Hemiplegia and hemiparesis following cerebral infarction affecting right dominant side: Secondary | ICD-10-CM

## 2016-11-26 NOTE — Therapy (Addendum)
[facility] [address] [place], [place], [address] Phone: [phone]   Fax:  [phone]  Occupational Therapy Treatment  Patient Details  Name: Kelly [name] MRN: [id number] Date of Birth: Aug 09, 1927 Referring Provider: Cecille Rubin, NP  Encounter Date: 11/26/2016      OT End of Session - 11/26/16 1138    Visit Number 45   Number of Visits 19  30+16=46   Date for OT Re-Evaluation 11/27/16   Authorization Type Medicare / [employer] (g-code needed)   Authorization Time Period renewal completed 07/30/16; renewal completed 09/28/16-11/27/16   Authorization - Visit Number 80   Authorization - Number of Visits 50   OT Start Time 1103   OT Stop Time 1145   OT Time Calculation (min) 42 min   Activity Tolerance Patient tolerated treatment well   Behavior During Therapy [facility] for tasks assessed/performed      Past Medical History:  Diagnosis Date  . Acute blood loss anemia   . Adjustment disorder with depressed mood   . CVA (cerebral vascular accident) ([place]) 02/03/2016   Linear infarct within the posterior limb of left internal capsule  . Diastolic CHF ([place])   . Dysphagia   . Dysphonia 02/20/2013  . Essential and other specified forms of tremor 02/20/2013  . HLD (hyperlipidemia)   . HTN (hypertension)   . OAB (overactive bladder)   . Osteoporosis   . Patient receiving subcutaneous heparin    For DVT prophylaxis 9/17  . Prediabetes   . Psoriasis   . Slow transit constipation   . Stroke (cerebrum) ([place]) 02/01/2016  . Trigeminal neuralgia 02/20/2013    Past Surgical History:  Procedure Laterality Date  . APPENDECTOMY  1940  . bladder tack    . CATARACT EXTRACTION Bilateral   . HEMORRHOID SURGERY  1960  . HUMERUS FRACTURE SURGERY    . LAPAROSCOPIC HYSTERECTOMY  2006  . torn rotator cuff    . WRIST FRACTURE SURGERY  2005   seconday shoulder 2001    There were no vitals filed for this visit.      Subjective  Assessment - 11/26/16 1108    Subjective  I just want to do as good as I did last time    Patient is accompained by: Family member   Pertinent History adjustment disorder with depressed mood, diastolic CHF, osteoporosis, hx of fall, essential tremor, HTN, prediabetes, trigeminal neuralgia   Limitations fall risk   Patient Stated Goals be able to write, fold clothes, do dishes   Currently in Pain? No/denies         Wt. Bearing with RUE on mat with body on arm movements with min cueing.  Functional reaching to grasp/release cylinder objects with min difficulty (mid-level).  Checked remaining goals and discussed progress.  Reviewed recommendations for home and Pt/husband verbalized understanding.  Low range functional reaching to place cylinder pegs in pegboard with min difficulty with in-hand manipulation, min cues.  Placing checkers in connect 4 slots in standing (mid level) with min difficulty.  Arm bike x6 min level 1 (forward/backwards) for reciprocal movement/conditioning without rest.  Pt reports that she was able to cut watermelon up last night and that she took a shower by herself with no problems.                         OT Short Term Goals - 11/26/16 1139      OT SHORT TERM GOAL #5  Title Pt will be able to cut food mod I.--check updated STGs 10/28/16   Status Partially Met  08/24/16:  improved and is performing inconsistently;  09/28/16  continues to have difficulty.  10/20/16  pt able to cut soft food and was able to return demo use of rocker knife for simulated meat cutting in clinic 11/23/16  able to cut watermelon     OT SHORT TERM GOAL #6   Title Pt will be able to write 1-2 sentences with 100% legibility.   Status Partially Met  08/25/16  approx 75% legibility today.  09/24/16  approx 85% legibility.  10/20/16  1 sentence with approx 95% legibility, 2nd sentence with approx 85% legibility, 11/02/16  not met.  11/23/16  Partially met.  Not consistent,  (but met  today for 1st sentence)     OT SHORT TERM GOAL #7   Title Pt will be able to reach to retrieve cylinder objects with RUE from at least 75* shoulder flex with 80% accuracy.   Status Achieved  10/20/16  met with approx 80% accuracy in sitting           OT Long Term Goals - 11/26/16 1153      OT LONG TERM GOAL #4   Title Pt will improve functional reaching and grasp/release as shown by improving score on box and blocks to at least 25.--check updated LTGs 11/27/16  Revised 07/09/16:  as pt met initial goal of improving by 10   Baseline 3 blocks   Period Weeks   Status Achieved  07/09/16  Met, and revised goal;  07/30/16  17 blocks.  09/24/16  21 blocks.  10/20/16:  25 blocks     OT LONG TERM GOAL #6   Title --------     OT LONG TERM GOAL #7   Title Pt will demo improved coordination for ADLs as shown by completing 9-hole peg test in 55mn or less.   Period Weeks   Status Achieved  08/25/16:  placed 4 pegs in within 249m, completed in 43m48m37.79sec.  09/24/16:   2mi48m1.97sec.  10/20/16  1min19m.38sec     OT LONG TERM GOAL #8   Title Pt will be able to reach to retrieve cylinder objects with RUE from at least 85* shoulder flex with 80% accuracy.   Period Weeks   Status Achieved  11/03/16:  met at approx this level     OT LONG TERM GOAL  #9   Baseline Pt will be able to manipulate 1 inch object in R hand with at least 75% accuracy.   Status Not Met  11/26/16:  approx 50%                Plan - 11/26/16 1138    Clinical Impression Statement Pt/husband verbalize understanding of continued recommendations and HEP and is appropriate for OT d/c at this time.   Rehab Potential Good   OT Frequency 2x / week   OT Duration --  9 weeks   OT Treatment/Interventions Self-care/ADL training;Therapeutic exercise;DME and/or AE instruction;FunctTherapist, nutritionalapeutic activities;Patient/family education;Balance training;Neuromuscular education;Electrical Stimulation;Moist Heat;Energy  conservation;Passive range of motion;Therapeutic exercises;Splinting;Manual Therapy;Fluidtherapy;Ultrasound;Cryotherapy;Parrafin   Plan d/c OT   OT Home Exercise Plan Education provided:  06/05/15 Initial HEP (table slides with light wt. bearing, tracing, and flipping cards); 06/22/16 supine ball ex and thumb opposition; 09/22/16  updates to HEP   Consulted and Agree with Plan of Care Patient   Family Member Consulted husband      Patient  will benefit from skilled therapeutic intervention in order to improve the following deficits and impairments:  Decreased coordination, Decreased activity tolerance, Decreased knowledge of use of DME, Decreased strength, Impaired UE functional use, Impaired tone, Decreased range of motion, Decreased balance, Decreased mobility, Difficulty walking  Visit Diagnosis: Hemiplegia and hemiparesis following cerebral infarction affecting right dominant side (HCC)  Other symptoms and signs involving the nervous system  Other lack of coordination  Unsteadiness on feet  Other abnormalities of gait and mobility  Muscle weakness (generalized)      G-Codes - 29-Nov-2016 1245    Functional Assessment Tool Used (Outpatient only) 9-hole peg test:   R 100.38 sec.  Box and blocks test:  R-25 blocks; clinical judgement   Functional Limitation Carrying, moving and handling objects   Carrying, Moving and Handling Objects Goal Status [phone]) At least 20 percent but less than 40 percent impaired, limited or restricted   Carrying, Moving and Handling Objects Discharge Status [phone]) At least 20 percent but less than 40 percent impaired, limited or restricted      Problem List Patient Active Problem List   Diagnosis Date Noted  . Slow transit constipation   . Acute blood loss anemia   . Adjustment disorder with depressed mood   . Cerebrovascular accident (CVA) ([place]) 02/03/2016  . Dysarthria, post-stroke   . Dysphagia, post-stroke   . Leukocytosis   . Prediabetes   .  Right hemiparesis ([place])   . Cerebral infarction due to unspecified mechanism   . Other secondary hypertension   . Overactive bladder   . Diastolic dysfunction   . Hyperlipidemia   . Essential hypertension   . Essential tremor   . Stroke (cerebrum) ([place]) 02/01/2016  . Facial droop due to stroke 02/01/2016  . Essential and other specified forms of tremor 02/20/2013  . Dysphonia 02/20/2013  . Trigeminal neuralgia 02/20/2013   OCCUPATIONAL THERAPY DISCHARGE SUMMARY  Visits from Start of Care: 45  Current functional level related to goals / functional outcomes: See above   Remaining deficits: R hemiparesis with decr coordination, spasticity, decr RUE functional use, decr balance/functional mobility for ADLs   Education / Equipment: Pt instructed in HEP, strategies to incr RUE functional use, AE for for ADLs.  Pt/husband verbalized understanding of all education provided.  Plan: Patient agrees to discharge.  Patient goals were partially met. Patient is being discharged due to                                                     Reaching maximal rehab potential at this time.?????        [facility] 11-29-2016, 12:47 PM  [facility] [address] [place], [place], [address] Phone: [phone]   Fax:  [phone]  Name: Kelly [name] MRN: [id number] Date of Birth: 1927-11-14   Vianne Bulls, OTR/L [facility] [address] [place], [place]  [address] [phone] phone [phone] 2016-11-29 12:47 PM

## 2016-12-10 ENCOUNTER — Encounter: Payer: Medicare Other | Attending: Physical Medicine & Rehabilitation | Admitting: Physical Medicine & Rehabilitation

## 2016-12-10 ENCOUNTER — Encounter: Payer: Self-pay | Admitting: Physical Medicine & Rehabilitation

## 2016-12-10 VITALS — BP 134/70 | HR 59

## 2016-12-10 DIAGNOSIS — R7303 Prediabetes: Secondary | ICD-10-CM | POA: Diagnosis not present

## 2016-12-10 DIAGNOSIS — E785 Hyperlipidemia, unspecified: Secondary | ICD-10-CM | POA: Diagnosis not present

## 2016-12-10 DIAGNOSIS — I110 Hypertensive heart disease with heart failure: Secondary | ICD-10-CM | POA: Insufficient documentation

## 2016-12-10 DIAGNOSIS — L409 Psoriasis, unspecified: Secondary | ICD-10-CM | POA: Diagnosis not present

## 2016-12-10 DIAGNOSIS — I639 Cerebral infarction, unspecified: Secondary | ICD-10-CM | POA: Diagnosis not present

## 2016-12-10 DIAGNOSIS — I69351 Hemiplegia and hemiparesis following cerebral infarction affecting right dominant side: Secondary | ICD-10-CM | POA: Diagnosis not present

## 2016-12-10 DIAGNOSIS — M810 Age-related osteoporosis without current pathological fracture: Secondary | ICD-10-CM | POA: Insufficient documentation

## 2016-12-10 DIAGNOSIS — F4321 Adjustment disorder with depressed mood: Secondary | ICD-10-CM | POA: Insufficient documentation

## 2016-12-10 DIAGNOSIS — G8111 Spastic hemiplegia affecting right dominant side: Secondary | ICD-10-CM | POA: Diagnosis not present

## 2016-12-10 DIAGNOSIS — N3281 Overactive bladder: Secondary | ICD-10-CM | POA: Insufficient documentation

## 2016-12-10 DIAGNOSIS — IMO0002 Reserved for concepts with insufficient information to code with codable children: Secondary | ICD-10-CM

## 2016-12-10 DIAGNOSIS — G250 Essential tremor: Secondary | ICD-10-CM | POA: Diagnosis not present

## 2016-12-10 DIAGNOSIS — I5030 Unspecified diastolic (congestive) heart failure: Secondary | ICD-10-CM | POA: Diagnosis not present

## 2016-12-10 NOTE — Progress Notes (Signed)
Botox: Procedure Note Patient Name: Kelly HumphreysBennie H [name] DOB: 12/04/1927 MRN: [id number]  Date: 12/10/16   Procedure: Botulinum toxin administration Guidance: EMG Diagnosis: G81.11 Attending: Maryla MorrowAnkit Ananias Kolander, MD    Trade name: Botox (onabotulinumtoxinA)  Informed consent: Risks, benefits & options of the procedure are explained to the patient (and/or family). The patient elects to proceed with procedure. Risks include but are not limited to weakness, respiratory distress, dry mouth, ptosis, antibody formation, worsening of some areas of function. Benefits include decreased abnormal muscle tone, improved hygiene and positioning, decreased skin breakdown and, in some cases, decreased pain. Options include conservative management with oral antispasticity agents, phenol chemodenervation of nerve or at motor nerve branches. More invasive options include intrathecal balcofen adminstration for appropriate candidates. Surgical options may include tendon lengthening or transposition or, rarely, dorsal rhizotomy.   History/Physical Examination: 83 y.o. spastic hemiplegia affecting right dominant side after left internal capsule CVA.  mAS  Right elbow flexors 1+/4   Right wrist flexors 2/4   Right finger flexors 2/4 Previous Treatments: Failed Baclofen.  Therapy/Range of motion Indication for guidance: Target active muscules  Procedure: Botulinum toxin was mixed with preservative free saline with a dilution of 1cc to 100 units. Targeted limb and muscles were identified. The skin was prepped with alcohol swabs and placement of needle tip in targeted muscle was confirmed using appropriate guidance. Prior to injection, positioning of needle tip outside of blood vessel was determined by pulling back on syringe plunger.  MUSCLE UNITS: Right Biceps 30U Right FCR 20U Right FDS 50U Right FDP 20U   Total units used: 120U 80U discarded due to unavoidable waste  Complications: None Plan: Follow up in 6 weeks for  Botox eval  Dorance Spink Anil Alleya Demeter  11:30 AM

## 2016-12-15 DIAGNOSIS — E559 Vitamin D deficiency, unspecified: Secondary | ICD-10-CM | POA: Diagnosis not present

## 2016-12-15 DIAGNOSIS — E7800 Pure hypercholesterolemia, unspecified: Secondary | ICD-10-CM | POA: Diagnosis not present

## 2016-12-22 DIAGNOSIS — Z6820 Body mass index (BMI) 20.0-20.9, adult: Secondary | ICD-10-CM | POA: Diagnosis not present

## 2016-12-22 DIAGNOSIS — E559 Vitamin D deficiency, unspecified: Secondary | ICD-10-CM | POA: Diagnosis not present

## 2016-12-22 DIAGNOSIS — I638 Other cerebral infarction: Secondary | ICD-10-CM | POA: Diagnosis not present

## 2016-12-22 DIAGNOSIS — E7800 Pure hypercholesterolemia, unspecified: Secondary | ICD-10-CM | POA: Diagnosis not present

## 2016-12-22 DIAGNOSIS — N3281 Overactive bladder: Secondary | ICD-10-CM | POA: Diagnosis not present

## 2016-12-22 DIAGNOSIS — Z1389 Encounter for screening for other disorder: Secondary | ICD-10-CM | POA: Diagnosis not present

## 2016-12-22 DIAGNOSIS — G500 Trigeminal neuralgia: Secondary | ICD-10-CM | POA: Diagnosis not present

## 2016-12-22 DIAGNOSIS — M1611 Unilateral primary osteoarthritis, right hip: Secondary | ICD-10-CM | POA: Diagnosis not present

## 2016-12-22 DIAGNOSIS — K219 Gastro-esophageal reflux disease without esophagitis: Secondary | ICD-10-CM | POA: Diagnosis not present

## 2016-12-22 DIAGNOSIS — I6789 Other cerebrovascular disease: Secondary | ICD-10-CM | POA: Diagnosis not present

## 2016-12-22 DIAGNOSIS — Z0000 Encounter for general adult medical examination without abnormal findings: Secondary | ICD-10-CM | POA: Diagnosis not present

## 2016-12-22 DIAGNOSIS — I4460 Unspecified fascicular block: Secondary | ICD-10-CM | POA: Diagnosis not present

## 2016-12-25 ENCOUNTER — Other Ambulatory Visit: Payer: Self-pay | Admitting: Physical Medicine & Rehabilitation

## 2017-01-03 ENCOUNTER — Other Ambulatory Visit: Payer: Self-pay | Admitting: Physical Medicine & Rehabilitation

## 2017-01-21 ENCOUNTER — Encounter: Payer: Self-pay | Admitting: Physical Medicine & Rehabilitation

## 2017-01-21 ENCOUNTER — Encounter: Payer: Medicare Other | Attending: Physical Medicine & Rehabilitation | Admitting: Physical Medicine & Rehabilitation

## 2017-01-21 VITALS — BP 127/63 | HR 68 | Resp 14

## 2017-01-21 DIAGNOSIS — G8191 Hemiplegia, unspecified affecting right dominant side: Secondary | ICD-10-CM

## 2017-01-21 DIAGNOSIS — I5030 Unspecified diastolic (congestive) heart failure: Secondary | ICD-10-CM | POA: Insufficient documentation

## 2017-01-21 DIAGNOSIS — L409 Psoriasis, unspecified: Secondary | ICD-10-CM | POA: Insufficient documentation

## 2017-01-21 DIAGNOSIS — I69351 Hemiplegia and hemiparesis following cerebral infarction affecting right dominant side: Secondary | ICD-10-CM | POA: Insufficient documentation

## 2017-01-21 DIAGNOSIS — I63312 Cerebral infarction due to thrombosis of left middle cerebral artery: Secondary | ICD-10-CM | POA: Diagnosis not present

## 2017-01-21 DIAGNOSIS — G250 Essential tremor: Secondary | ICD-10-CM | POA: Diagnosis not present

## 2017-01-21 DIAGNOSIS — R7303 Prediabetes: Secondary | ICD-10-CM | POA: Insufficient documentation

## 2017-01-21 DIAGNOSIS — F4321 Adjustment disorder with depressed mood: Secondary | ICD-10-CM | POA: Diagnosis not present

## 2017-01-21 DIAGNOSIS — I639 Cerebral infarction, unspecified: Secondary | ICD-10-CM

## 2017-01-21 DIAGNOSIS — G8111 Spastic hemiplegia affecting right dominant side: Secondary | ICD-10-CM | POA: Diagnosis not present

## 2017-01-21 DIAGNOSIS — M810 Age-related osteoporosis without current pathological fracture: Secondary | ICD-10-CM | POA: Insufficient documentation

## 2017-01-21 DIAGNOSIS — R269 Unspecified abnormalities of gait and mobility: Secondary | ICD-10-CM

## 2017-01-21 DIAGNOSIS — I110 Hypertensive heart disease with heart failure: Secondary | ICD-10-CM | POA: Insufficient documentation

## 2017-01-21 DIAGNOSIS — N3281 Overactive bladder: Secondary | ICD-10-CM | POA: Diagnosis not present

## 2017-01-21 DIAGNOSIS — E785 Hyperlipidemia, unspecified: Secondary | ICD-10-CM | POA: Insufficient documentation

## 2017-01-21 DIAGNOSIS — IMO0002 Reserved for concepts with insufficient information to code with codable children: Secondary | ICD-10-CM

## 2017-01-21 NOTE — Progress Notes (Signed)
Subjective:    Patient ID: Kelly [name], female    DOB: 14-May-1928, 83 y.o.   MRN: [id number]  HPI 83 year old right-handed female with history of essential tremor presents for follow up for infarct within the posterior limb of the left internal capsule.  Last clinic visit 12/10/16.  She had a Botox injection at that time. Presents with husband who provides majority of the history. She had:  Right Biceps 30U Right FCR 20U Right FDS 50U Right FDP 20U She feels she did not receive much benefit the botox injection, but objectively is doing better. She continues to want/expect increase in [facility].   Pain Inventory Average Pain 0 Pain Right Now 0 My pain is  no pain  In the last 24 hours, has pain interfered with the following? General activity 0 Relation with others 0 Enjoyment of life 0 What TIME of day is your pain at its worst? no pain Sleep (in general) Good  Pain is worse with: no pain Pain improves with: no pain Relief from Meds: no meds  Mobility walk with assistance use a walker how many minutes can you walk? 10 ability to climb steps?  no do you drive?  no  Function retired I need assistance with the following:  meal prep, household duties and shopping  Neuro/Psych bladder control problems bowel control problems tremor trouble walking  Prior Studies Any changes since last visit?  no  Physicians involved in your care Any changes since last visit?  no   Family History  Problem Relation Age of Onset  . Arthritis Brother        spine  . Osteoporosis Maternal Grandmother    Social History   Social History  . Marital status: Married    Spouse name: N/A  . Number of children: 3  . Years of education: college   Occupational History  . real estate     retired   Social History Main Topics  . Smoking status: Never Smoker  . Smokeless tobacco: Never Used  . Alcohol use Yes     Comment: Wine 2 glass daily  . Drug use: No  . Sexual activity: Not  Asked   Other Topics Concern  . None   Social History Narrative   Lives at home with her husband.   Right-handed.   No caffeine use.   Past Surgical History:  Procedure Laterality Date  . APPENDECTOMY  1940  . bladder tack    . CATARACT EXTRACTION Bilateral   . HEMORRHOID SURGERY  1960  . HUMERUS FRACTURE SURGERY    . LAPAROSCOPIC HYSTERECTOMY  2006  . torn rotator cuff    . WRIST FRACTURE SURGERY  2005   seconday shoulder 2001   Past Medical History:  Diagnosis Date  . Acute blood loss anemia   . Adjustment disorder with depressed mood   . CVA (cerebral vascular accident) (H[facility]) 02/03/2016   Linear infarct within the posterior limb of left internal capsule  . Diastolic CHF (HCC)   . Dysphagia   . Dysphonia 02/20/2013  . Essential and other specified forms of tremor 02/20/2013  . HLD (hyperlipidemia)   . HTN (hypertension)   . OAB (overactive bladder)   . Osteoporosis   . Patient receiving subcutaneous heparin    For DVT prophylaxis 9/17  . Prediabetes   . Psoriasis   . Slow transit constipation   . Stroke (cerebrum) (H[facility]) 02/01/2016  . Trigeminal neuralgia 02/20/2013   BP 127/63 (BP Location: Left Arm, Patient  Position: Sitting, Cuff Size: Normal)   Pulse 68   Resp 14   SpO2 97%   Opioid Risk Score:   Fall Risk Score:  `1  Depression screen PHQ 2/9  [facility] 12/10/2016 04/24/2016  Decreased Interest 0 0  Down, Depressed, Hopeless 0 0  PHQ - 2 Score 0 0  Altered sleeping - 0  Tired, decreased energy - 0  Change in appetite - 0  Feeling bad or failure about yourself  - 0  Trouble concentrating - 0  Moving slowly or fidgety/restless - 0  Suicidal thoughts - 0  PHQ-9 Score - 0    Review of Systems  Constitutional: Negative.   HENT: Negative.   Eyes: Negative.   Respiratory: Negative.   Cardiovascular: Negative.   Gastrointestinal: Positive for constipation. Negative for diarrhea.       Incontinence  Endocrine: Negative.     Musculoskeletal: Positive for arthralgias and gait problem.  Skin: Negative.   Allergic/Immunologic: Negative.   Neurological: Positive for tremors.  Hematological: Bruises/bleeds easily.  Psychiatric/Behavioral: Negative.   All other systems reviewed and are negative.     Objective:   Physical Exam Constitutional: She appears well-developed. Frail. NAD.  HENT: Normocephalic and atraumatic.  Eyes: EOMI. No discharge..  Cardiovascular: RRR.  No JVD. Respiratory: Effort normal and breath sounds normal.  GI: Soft. Bowel sounds are normal.  Musculoskeletal: She exhibits no edema or tenderness.  Gait: Slow cadence with kyphotic posture Neurological: She is alert and oriented.  HOH Mild dysarthria.  Right facial droop +LUE Resting tremor Motor: RUE: shoulder abduction, elbow flexion/extension 4/5, wrist 4/5, hand 4/5 RLE: 4+/5 hip flexion, knee extension, ankle dorsi/plantar flexion MAS: right elbow flexors 1/4, wrist flexors 1/4, finger flexors 1/4.  LUE/LLE: 5/5 proximal to distal  Psychiatric: She has a normal mood and affect. Her behavior is normal Skin: Skin is warm and dry    Assessment & Plan:  83 year old right-handed female with history of essential tremor presents for follow for infarct within the posterior limb of the left internal capsule.  1. Late effects infarct within the posterior limb of the left internal capsule with spastic hemiplegia affecting right dominant side (G81.11).  On last visit:   Right Biceps 30U   Right FCR 20U   Right FDS 50U   Right FDP 20U  Pt would like to consider if she would like to increase the dose on next visit.    Cont therapies  2. Gait abnormality  Cont therapies  Cont walker for safety  >25 minutes spent with patient, with >20 minutes spent with patient and husband counseling regarding stroke, spasticity, recovery

## 2017-02-15 NOTE — Progress Notes (Signed)
[facility]  PATIENT: Kelly [name] DOB: 21-Oct-1927   REASON FOR VISIT: Follow-up for stroke  HISTORY FROM: Patient and husband    HISTORY OF PRESENT ILLNESS:UPDATE 09/25/2018CM Kelly [name], 83 year old female returns for follow-up with history of stroke event in September 2017. She is currently on aspirin 325 daily for secondary stroke prevention without further stroke TIA symptoms since that time. She has minimal bruising or bleeding. She remains on Lipitor for hyperlipidemia without myalgias. Recent labs at primary care doctor were stable according to the patient and her husband . Her physical therapy has concluded and she continues her home exercise program. She continues to ambulate with a walker. Essential tremor left greater than right, she remains on Topamax by primary care. She has not had recent falls. She sleeps well. She has no new neurologic complaints  UPDATE 03/15/2018CM Kelly [name], 83 year old female returns for follow-up with history of stroke 02/01/2016. She is currently on aspirin 325 daily without further stroke or TIA symptoms. She has some mild bruising but no bleeding. In addition she is on Lipitor for hyperlipidemia, she denies any myalgias. She continues to have some weakness on the right side upper and lower extremities. She is currently getting physical therapy and occupational therapy 2 times a week. She has been pleased with her response to therapy. She is ambulating with a walker today she denies any recent falls. She returns for reevaluation   HISTORY 04/29/16 CMBennie H Rallsis a 83 y.o.femalewho awoke this morning normal at 6 AM. She then began feeling unwell half hour or so later lay back down awakening at 7:30 noticing right facial weakness. She states that she also noticed slurring. She is not sure if she notices much weakness on her right side, but notes that she has chronic problems with both legs, more so since her recent fall.She has  significant right facial droop, right upper extremity weakness. MRI of the brain Acute nonhemorrhagic 1.4 cm linear infarct within the posterior limb of the left internal capsule. This corresponds with the patient's right-sided symptoms and a aphasia. Advanced atrophy and diffuse white matter disease. CTA of the head negative for large vessel occlusion. CTA of the neck negative. LDL 107 hemoglobin A1c 5.7. Echocardiogram was technically difficult but showed no signs of thromboembolic source. She was not on anti thrombotic prior to admission. She returns to the stroke clinic today for follow-up. She remains on aspirin 325 daily for secondary stroke prevention without further stroke or TIA symptoms. She has no bruising and bleeding .In addition she is on Lipitor 40 mg daily without complaints of myalgias, she is on Topamax 25 mg twice daily for tremor. She is currently getting home physical therapy after being at [facility] place for several weeks receiving therapy. She returns for reevaluation   REVIEW OF SYSTEMS: Full 14 system review of systems performed and notable only for those listed, all others are neg:  Constitutional: neg  Cardiovascular: neg Ear/Nose/Throat: neg  Skin: neg Eyes: neg Respiratory: neg Gastroitestinal: neg  Hematology/Lymphatic: neg  Endocrine: neg MusculoskeletalWalking difficulty/ Allergy/Immunology: neg Neurological: Essential tremor,  Psychiatric: neg Sleep : neg   ALLERGIES: No Known Allergies  HOME MEDICATIONS: Outpatient Medications Prior to Visit  Medication Sig Dispense Refill  . aspirin 325 MG tablet Take 1 tablet (325 mg total) by mouth daily.    Marland Kitchen atorvastatin (LIPITOR) 40 MG tablet Take 1 tablet (40 mg total) by mouth daily at 6 PM.    . FLUoxetine (PROZAC) 20 MG tablet TAKE  ONE TABLET BY MOUTH ONCE DAILY 90 tablet 0  . oxybutynin (DITROPAN-XL) 10 MG 24 hr tablet Take 10 mg by mouth at bedtime.    . topiramate (TOPAMAX) 50 MG tablet Take 50 mg by mouth 2  (two) times daily.     No facility-administered medications prior to visit.     PAST MEDICAL HISTORY: Past Medical History:  Diagnosis Date  . Acute blood loss anemia   . Adjustment disorder with depressed mood   . CVA (cerebral vascular accident) (HCC) 02/03/2016   Linear infarct within the posterior limb of left internal capsule  . Diastolic CHF (HCC)   . Dysphagia   . Dysphonia 02/20/2013  . Essential and other specified forms of tremor 02/20/2013  . HLD (hyperlipidemia)   . HTN (hypertension)   . OAB (overactive bladder)   . Osteoporosis   . Patient receiving subcutaneous heparin    For DVT prophylaxis 9/17  . Prediabetes   . Psoriasis   . Slow transit constipation   . Stroke (cerebrum) (HCC) 02/01/2016  . Trigeminal neuralgia 02/20/2013    PAST SURGICAL HISTORY: Past Surgical History:  Procedure Laterality Date  . APPENDECTOMY  1940  . bladder tack    . CATARACT EXTRACTION Bilateral   . HEMORRHOID SURGERY  1960  . HUMERUS FRACTURE SURGERY    . LAPAROSCOPIC HYSTERECTOMY  2006  . torn rotator cuff    . WRIST FRACTURE SURGERY  2005   seconday shoulder 2001    FAMILY HISTORY: Family History  Problem Relation Age of Onset  . Arthritis Brother        spine  . Osteoporosis Maternal Grandmother     SOCIAL HISTORY: Social History   Social History  . Marital status: Married    Spouse name: [name]  . Number of children: 3  . Years of education: college   Occupational History  . real estate     retired   Social History Main Topics  . Smoking status: Never Smoker  . Smokeless tobacco: Never Used  . Alcohol use Yes     Comment: Wine 2 glass daily  . Drug use: No  . Sexual activity: Not on file   Other Topics Concern  . Not on file   Social History Narrative   Lives at home with her husband.   Right-handed.   No caffeine use.     PHYSICAL EXAM  Vitals:   02/16/17 1339  BP: (!) 108/58  Pulse: 81  Weight: 99 lb 6.4 oz (45.1 kg)   Body mass  index is 19.41 kg/m.  Generalized: Well developed, in no acute distress  Head: normocephalic and atraumatic,. Oropharynx benign  Neck: Supple, no carotid bruits  Cardiac: Regular rate rhythm, no murmur  Musculoskeletal: No deformity   Neurological examination   Mentation: Alert oriented to time, place, history taking. Attention span and concentration appropriate. Recent and remote memory intact.  Follows all commands speech With mild dysarthria .   Cranial nerve II-XII: .Pupils were equal round reactive to light extraocular movements were full, visual field were full on confrontational test. No facial droop .hearing was intact to finger rubbing bilaterally. Uvula tongue midline. head turning and shoulder shrug were normal and symmetric.Tongue protrusion to the right. Motor: normal bulk and tone, full strength in the BUE, BLE, on the left, and right upper extremity right lower extremity 4 out of 5  Sensory: normal and symmetric to light touch, pinprick, and  Vibration, in the upper and lower extremities Coordination: finger-nose-finger,  heel-to-shin bilaterally, no dysmetria, mild essential tremor left greater than right  Reflexes: 1+ upper lower and symmetric plantar responses were flexor bilaterally. Gait and Station:arises from the chair with pushoff, ambulates 30 feet in the hall with walker, no difficulty with turns, slow steady gait DIAGNOSTIC DATA (LABS, IMAGING, TESTING) - I reviewed patient records, labs, notes, testing and imaging myself where available.  Lab Results  Component Value Date   WBC 8.3 02/25/2016   HGB 11.8 (A) 02/25/2016   HCT 36 02/25/2016   MCV 91.0 02/13/2016   PLT 205 02/25/2016      Component Value Date/Time   NA 139 02/13/2016 0527   K 3.9 02/13/2016 0527   CL 108 02/13/2016 0527   CO2 25 02/13/2016 0527   GLUCOSE 94 02/13/2016 0527   BUN 16 02/13/2016 0527   CREATININE 0.73 02/13/2016 0527   CALCIUM 8.9 02/13/2016 0527   PROT 6.5 02/04/2016 0713     ALBUMIN 3.5 02/04/2016 0713   AST 19 02/04/2016 0713   ALT 14 02/04/2016 0713   ALKPHOS 74 02/04/2016 0713   BILITOT 0.5 02/04/2016 0713   GFRNONAA >60 02/13/2016 0527   GFRAA >60 02/13/2016 0527   Lab Results  Component Value Date   CHOL 162 02/02/2016   HDL 40 (L) 02/02/2016   LDLCALC 107 (H) 02/02/2016   TRIG 75 02/02/2016   CHOLHDL 4.1 02/02/2016   Lab Results  Component Value Date   HGBA1C 5.7 (H) 02/02/2016       ASSESSMENT AND PLAN  83 y.o. year old female  has a past medical history of  Essential and other specified forms of tremor (02/20/2013); HLD (hyperlipidemia); HTN (hypertension);  Stroke (cerebrum) (HCC) (02/01/2016);  here to follow-up for her stroke.The patient is a current patient of Dr. Roda Shutters  who is out of the office today . This note is sent to the work in doctor.     PLAN: Stressed the importance of continued management of risk factors to prevent further stroke Continue aspirin for secondary stroke prevention Maintain strict control of hypertension with blood pressure goal below 130/90, today's reading 108/58 Cholesterol with LDL cholesterol less than 19,JYNWGNFA Lipitor Continue home exercise program use walker at all times  eat healthy diet with whole grains,  fresh fruits and vegetables Continue  Topamax  1 twice daily  for essential tremor by Dr. Neale Burly Will discharge A total of 25 min  minutes was spent face-to-face with this patient. Over half this time was spent on counseling patient on the diagnosis of stroke and management of risk factors. Written information given. Instructed on what to do for recurrent stroke symptoms.  Nilda Riggs, [facility], [facility], APRN  [facility] [address], [address] [place], [place] [address] [phone]-

## 2017-02-16 ENCOUNTER — Ambulatory Visit (INDEPENDENT_AMBULATORY_CARE_PROVIDER_SITE_OTHER): Payer: Medicare Other | Admitting: Nurse Practitioner

## 2017-02-16 ENCOUNTER — Encounter: Payer: Self-pay | Admitting: Nurse Practitioner

## 2017-02-16 VITALS — BP 108/58 | HR 81 | Wt 99.4 lb

## 2017-02-16 DIAGNOSIS — Z8673 Personal history of transient ischemic attack (TIA), and cerebral infarction without residual deficits: Secondary | ICD-10-CM | POA: Diagnosis not present

## 2017-02-16 DIAGNOSIS — I10 Essential (primary) hypertension: Secondary | ICD-10-CM

## 2017-02-16 DIAGNOSIS — R269 Unspecified abnormalities of gait and mobility: Secondary | ICD-10-CM

## 2017-02-16 DIAGNOSIS — I69398 Other sequelae of cerebral infarction: Secondary | ICD-10-CM | POA: Insufficient documentation

## 2017-02-16 DIAGNOSIS — E785 Hyperlipidemia, unspecified: Secondary | ICD-10-CM

## 2017-02-16 NOTE — Progress Notes (Signed)
I agree with the above plan 

## 2017-02-16 NOTE — Patient Instructions (Addendum)
Stressed the importance of continued management of risk factors to prevent further stroke Continue aspirin for secondary stroke prevention Maintain strict control of hypertension with blood pressure goal below 130/90, today's reading 108/58 Cholesterol with LDL cholesterol less than 95,AOZHYQMV Lipitor Continue home exercise program use walker at all times  eat healthy diet with whole grains,  fresh fruits and vegetables Continue  Topamax  1 twice daily  for essential tremor by Dr. Neale Burly Will discharge Stroke Prevention Some medical conditions and behaviors are associated with an increased chance of having a stroke. You may prevent a stroke by making healthy choices and managing medical conditions. How can I reduce my risk of having a stroke?  Stay physically active. Get at least 30 minutes of activity on most or all days.  Do not smoke. It may also be helpful to avoid exposure to secondhand smoke.  Limit alcohol use. Moderate alcohol use is considered to be: ? No more than 2 drinks per day for men. ? No more than 1 drink per day for nonpregnant women.  Eat healthy foods. This involves: ? Eating 5 or more servings of fruits and vegetables a day. ? Making dietary changes that address high blood pressure (hypertension), high cholesterol, diabetes, or obesity.  Manage your cholesterol levels. ? Making food choices that are high in fiber and low in saturated fat, trans fat, and cholesterol may control cholesterol levels. ? Take any prescribed medicines to control cholesterol as directed by your health care provider.  Manage your diabetes. ? Controlling your carbohydrate and sugar intake is recommended to manage diabetes. ? Take any prescribed medicines to control diabetes as directed by your health care provider.  Control your hypertension. ? Making food choices that are low in salt (sodium), saturated fat, trans fat, and cholesterol is recommended to manage hypertension. ? Ask your  health care provider if you need treatment to lower your blood pressure. Take any prescribed medicines to control hypertension as directed by your health care provider. ? If you are 37-81 years of age, have your blood pressure checked every 3-5 years. If you are 71 years of age or older, have your blood pressure checked every year.  Maintain a healthy weight. ? Reducing calorie intake and making food choices that are low in sodium, saturated fat, trans fat, and cholesterol are recommended to manage weight.  Stop drug abuse.  Avoid taking birth control pills. ? Talk to your health care provider about the risks of taking birth control pills if you are over 48 years old, smoke, get migraines, or have ever had a blood clot.  Get evaluated for sleep disorders (sleep apnea). ? Talk to your health care provider about getting a sleep evaluation if you snore a lot or have excessive sleepiness.  Take medicines only as directed by your health care provider. ? For some people, aspirin or blood thinners (anticoagulants) are helpful in reducing the risk of forming abnormal blood clots that can lead to stroke. If you have the irregular heart rhythm of atrial fibrillation, you should be on a blood thinner unless there is a good reason you cannot take them. ? Understand all your medicine instructions.  Make sure that other conditions (such as anemia or atherosclerosis) are addressed. Get help right away if:  You have sudden weakness or numbness of the face, arm, or leg, especially on one side of the body.  Your face or eyelid droops to one side.  You have sudden confusion.  You have trouble speaking (  aphasia) or understanding.  You have sudden trouble seeing in one or both eyes.  You have sudden trouble walking.  You have dizziness.  You have a loss of balance or coordination.  You have a sudden, severe headache with no known cause.  You have new chest pain or an irregular heartbeat. Any of  these symptoms may represent a serious problem that is an emergency. Do not wait to see if the symptoms will go away. Get medical help at once. Call your local emergency services (911 in [place] Do not drive yourself to the hospital. This information is not intended to replace advice given to you by your health care provider. Make sure you discuss any questions you have with your health care provider. Document Released: 06/18/2004 Document Revised: 10/17/2015 Document Reviewed: 11/11/2012 Elsevier Interactive Patient Education  2017 [employer].

## 2017-03-17 ENCOUNTER — Encounter: Payer: Medicare Other | Attending: Physical Medicine & Rehabilitation | Admitting: Physical Medicine & Rehabilitation

## 2017-03-17 ENCOUNTER — Encounter: Payer: Self-pay | Admitting: Physical Medicine & Rehabilitation

## 2017-03-17 VITALS — BP 114/72 | HR 90 | Resp 14

## 2017-03-17 DIAGNOSIS — M810 Age-related osteoporosis without current pathological fracture: Secondary | ICD-10-CM | POA: Insufficient documentation

## 2017-03-17 DIAGNOSIS — G250 Essential tremor: Secondary | ICD-10-CM | POA: Diagnosis not present

## 2017-03-17 DIAGNOSIS — I69351 Hemiplegia and hemiparesis following cerebral infarction affecting right dominant side: Secondary | ICD-10-CM | POA: Diagnosis not present

## 2017-03-17 DIAGNOSIS — E785 Hyperlipidemia, unspecified: Secondary | ICD-10-CM | POA: Diagnosis not present

## 2017-03-17 DIAGNOSIS — I5030 Unspecified diastolic (congestive) heart failure: Secondary | ICD-10-CM | POA: Diagnosis not present

## 2017-03-17 DIAGNOSIS — I639 Cerebral infarction, unspecified: Secondary | ICD-10-CM | POA: Diagnosis not present

## 2017-03-17 DIAGNOSIS — R7303 Prediabetes: Secondary | ICD-10-CM | POA: Diagnosis not present

## 2017-03-17 DIAGNOSIS — I110 Hypertensive heart disease with heart failure: Secondary | ICD-10-CM | POA: Insufficient documentation

## 2017-03-17 DIAGNOSIS — L409 Psoriasis, unspecified: Secondary | ICD-10-CM | POA: Diagnosis not present

## 2017-03-17 DIAGNOSIS — N3281 Overactive bladder: Secondary | ICD-10-CM | POA: Insufficient documentation

## 2017-03-17 DIAGNOSIS — IMO0002 Reserved for concepts with insufficient information to code with codable children: Secondary | ICD-10-CM

## 2017-03-17 DIAGNOSIS — G8111 Spastic hemiplegia affecting right dominant side: Secondary | ICD-10-CM | POA: Diagnosis not present

## 2017-03-17 DIAGNOSIS — F4321 Adjustment disorder with depressed mood: Secondary | ICD-10-CM | POA: Diagnosis not present

## 2017-03-17 NOTE — Progress Notes (Signed)
Botox: Procedure Note Patient Name: Kelly HumphreysBennie H [name] DOB: 02-Jan-1928 MRN: [id number]  Date: 03/17/17  Procedure: Botulinum toxin administration Guidance: EMG Diagnosis: G81.11 Attending: Maryla MorrowAnkit Kenyetta Wimbish, MD    Trade name: Botox (onabotulinumtoxinA)  Informed consent: Risks, benefits & options of the procedure are explained to the patient (and/or family). The patient elects to proceed with procedure. Risks include but are not limited to weakness, respiratory distress, dry mouth, ptosis, antibody formation, worsening of some areas of function. Benefits include decreased abnormal muscle tone, improved hygiene and positioning, decreased skin breakdown and, in some cases, decreased pain. Options include conservative management with oral antispasticity agents, phenol chemodenervation of nerve or at motor nerve branches. More invasive options include intrathecal balcofen adminstration for appropriate candidates. Surgical options may include tendon lengthening or transposition or, rarely, dorsal rhizotomy.   History/Physical Examination: 83 y.o. spastic hemiplegia affecting right dominant side after left internal capsule CVA.  mAS  Right elbow flexors 1+/4   Right wrist flexors 2/4   Right finger flexors 2/4 Previous Treatments: Failed Baclofen.  Therapy/Range of motion Indication for guidance: Target active muscules  Procedure: Botulinum toxin was mixed with preservative free saline with a dilution of 1cc to 100 units. Targeted limb and muscles were identified. The skin was prepped with alcohol swabs and placement of needle tip in targeted muscle was confirmed using appropriate guidance. Prior to injection, positioning of needle tip outside of blood vessel was determined by pulling back on syringe plunger.  MUSCLE UNITS: Right Biceps 50U Right FCR 50U Right FDS 80U Right FDP 20U   Total units used: 200  Complications: None Plan: Follow up in 6 weeks for Botox reeval  Saachi Zale Anil Galia Rahm  11:01  AM

## 2017-03-24 DIAGNOSIS — Z23 Encounter for immunization: Secondary | ICD-10-CM | POA: Diagnosis not present

## 2017-04-03 ENCOUNTER — Inpatient Hospital Stay (HOSPITAL_COMMUNITY)
Admission: EM | Admit: 2017-04-03 | Discharge: 2017-04-05 | DRG: 065 | Disposition: A | Discharge status: Rehabilitation Facility | Payer: Medicare Other | Attending: Internal Medicine | Admitting: Internal Medicine

## 2017-04-03 ENCOUNTER — Encounter (HOSPITAL_COMMUNITY): Payer: Self-pay | Admitting: Emergency Medicine

## 2017-04-03 ENCOUNTER — Emergency Department (HOSPITAL_COMMUNITY): Payer: Medicare Other

## 2017-04-03 ENCOUNTER — Inpatient Hospital Stay (HOSPITAL_COMMUNITY): Payer: Medicare Other

## 2017-04-03 DIAGNOSIS — I10 Essential (primary) hypertension: Secondary | ICD-10-CM

## 2017-04-03 DIAGNOSIS — R8290 Unspecified abnormal findings in urine: Secondary | ICD-10-CM | POA: Diagnosis not present

## 2017-04-03 DIAGNOSIS — F329 Major depressive disorder, single episode, unspecified: Secondary | ICD-10-CM | POA: Diagnosis present

## 2017-04-03 DIAGNOSIS — R404 Transient alteration of awareness: Secondary | ICD-10-CM | POA: Diagnosis not present

## 2017-04-03 DIAGNOSIS — R29702 NIHSS score 2: Secondary | ICD-10-CM | POA: Diagnosis not present

## 2017-04-03 DIAGNOSIS — I6300 Cerebral infarction due to thrombosis of unspecified precerebral artery: Secondary | ICD-10-CM

## 2017-04-03 DIAGNOSIS — I69351 Hemiplegia and hemiparesis following cerebral infarction affecting right dominant side: Secondary | ICD-10-CM | POA: Diagnosis not present

## 2017-04-03 DIAGNOSIS — D62 Acute posthemorrhagic anemia: Secondary | ICD-10-CM | POA: Diagnosis not present

## 2017-04-03 DIAGNOSIS — I5032 Chronic diastolic (congestive) heart failure: Secondary | ICD-10-CM | POA: Diagnosis not present

## 2017-04-03 DIAGNOSIS — G500 Trigeminal neuralgia: Secondary | ICD-10-CM | POA: Diagnosis present

## 2017-04-03 DIAGNOSIS — S32040S Wedge compression fracture of fourth lumbar vertebra, sequela: Secondary | ICD-10-CM | POA: Diagnosis not present

## 2017-04-03 DIAGNOSIS — R29810 Facial weakness: Secondary | ICD-10-CM | POA: Diagnosis present

## 2017-04-03 DIAGNOSIS — N390 Urinary tract infection, site not specified: Secondary | ICD-10-CM | POA: Diagnosis not present

## 2017-04-03 DIAGNOSIS — I110 Hypertensive heart disease with heart failure: Secondary | ICD-10-CM | POA: Diagnosis present

## 2017-04-03 DIAGNOSIS — I69392 Facial weakness following cerebral infarction: Secondary | ICD-10-CM | POA: Diagnosis not present

## 2017-04-03 DIAGNOSIS — K5901 Slow transit constipation: Secondary | ICD-10-CM | POA: Diagnosis present

## 2017-04-03 DIAGNOSIS — G8111 Spastic hemiplegia affecting right dominant side: Secondary | ICD-10-CM | POA: Diagnosis present

## 2017-04-03 DIAGNOSIS — I639 Cerebral infarction, unspecified: Secondary | ICD-10-CM | POA: Diagnosis not present

## 2017-04-03 DIAGNOSIS — S32040A Wedge compression fracture of fourth lumbar vertebra, initial encounter for closed fracture: Secondary | ICD-10-CM | POA: Diagnosis not present

## 2017-04-03 DIAGNOSIS — E46 Unspecified protein-calorie malnutrition: Secondary | ICD-10-CM | POA: Diagnosis not present

## 2017-04-03 DIAGNOSIS — G8191 Hemiplegia, unspecified affecting right dominant side: Secondary | ICD-10-CM | POA: Diagnosis not present

## 2017-04-03 DIAGNOSIS — I63312 Cerebral infarction due to thrombosis of left middle cerebral artery: Secondary | ICD-10-CM

## 2017-04-03 DIAGNOSIS — R531 Weakness: Secondary | ICD-10-CM

## 2017-04-03 DIAGNOSIS — R54 Age-related physical debility: Secondary | ICD-10-CM | POA: Diagnosis present

## 2017-04-03 DIAGNOSIS — M810 Age-related osteoporosis without current pathological fracture: Secondary | ICD-10-CM | POA: Diagnosis present

## 2017-04-03 DIAGNOSIS — M4856XD Collapsed vertebra, not elsewhere classified, lumbar region, subsequent encounter for fracture with routine healing: Secondary | ICD-10-CM | POA: Diagnosis present

## 2017-04-03 DIAGNOSIS — Z79899 Other long term (current) drug therapy: Secondary | ICD-10-CM | POA: Diagnosis not present

## 2017-04-03 DIAGNOSIS — Z90710 Acquired absence of both cervix and uterus: Secondary | ICD-10-CM

## 2017-04-03 DIAGNOSIS — R29818 Other symptoms and signs involving the nervous system: Secondary | ICD-10-CM | POA: Diagnosis not present

## 2017-04-03 DIAGNOSIS — Z7982 Long term (current) use of aspirin: Secondary | ICD-10-CM | POA: Diagnosis not present

## 2017-04-03 DIAGNOSIS — L409 Psoriasis, unspecified: Secondary | ICD-10-CM | POA: Diagnosis present

## 2017-04-03 DIAGNOSIS — R7303 Prediabetes: Secondary | ICD-10-CM | POA: Diagnosis not present

## 2017-04-03 DIAGNOSIS — E8809 Other disorders of plasma-protein metabolism, not elsewhere classified: Secondary | ICD-10-CM | POA: Diagnosis present

## 2017-04-03 DIAGNOSIS — E785 Hyperlipidemia, unspecified: Secondary | ICD-10-CM | POA: Diagnosis present

## 2017-04-03 DIAGNOSIS — I6789 Other cerebrovascular disease: Secondary | ICD-10-CM | POA: Diagnosis not present

## 2017-04-03 DIAGNOSIS — Z8262 Family history of osteoporosis: Secondary | ICD-10-CM

## 2017-04-03 DIAGNOSIS — R0989 Other specified symptoms and signs involving the circulatory and respiratory systems: Secondary | ICD-10-CM | POA: Diagnosis not present

## 2017-04-03 DIAGNOSIS — T1490XA Injury, unspecified, initial encounter: Secondary | ICD-10-CM | POA: Diagnosis not present

## 2017-04-03 DIAGNOSIS — R29704 NIHSS score 4: Secondary | ICD-10-CM | POA: Diagnosis present

## 2017-04-03 DIAGNOSIS — Z8679 Personal history of other diseases of the circulatory system: Secondary | ICD-10-CM | POA: Diagnosis not present

## 2017-04-03 DIAGNOSIS — N319 Neuromuscular dysfunction of bladder, unspecified: Secondary | ICD-10-CM | POA: Diagnosis present

## 2017-04-03 DIAGNOSIS — D638 Anemia in other chronic diseases classified elsewhere: Secondary | ICD-10-CM | POA: Diagnosis present

## 2017-04-03 DIAGNOSIS — N3281 Overactive bladder: Secondary | ICD-10-CM | POA: Diagnosis not present

## 2017-04-03 DIAGNOSIS — I6389 Other cerebral infarction: Secondary | ICD-10-CM | POA: Diagnosis not present

## 2017-04-03 DIAGNOSIS — R1310 Dysphagia, unspecified: Secondary | ICD-10-CM | POA: Diagnosis present

## 2017-04-03 DIAGNOSIS — I351 Nonrheumatic aortic (valve) insufficiency: Secondary | ICD-10-CM | POA: Diagnosis not present

## 2017-04-03 DIAGNOSIS — R2990 Unspecified symptoms and signs involving the nervous system: Secondary | ICD-10-CM

## 2017-04-03 DIAGNOSIS — F4321 Adjustment disorder with depressed mood: Secondary | ICD-10-CM | POA: Diagnosis present

## 2017-04-03 LAB — COMPREHENSIVE METABOLIC PANEL
ALT: 20 U/L (ref 14–54)
AST: 20 U/L (ref 15–41)
Albumin: 3.3 g/dL — ABNORMAL LOW (ref 3.5–5.0)
Alkaline Phosphatase: 74 U/L (ref 38–126)
Anion gap: 5 (ref 5–15)
BUN: 23 mg/dL — ABNORMAL HIGH (ref 6–20)
CO2: 23 mmol/L (ref 22–32)
Calcium: 8.7 mg/dL — ABNORMAL LOW (ref 8.9–10.3)
Chloride: 111 mmol/L (ref 101–111)
Creatinine, Ser: 0.77 mg/dL (ref 0.44–1.00)
GFR calc Af Amer: >60 mL/min (ref >60)
GFR calc non Af Amer: >60 mL/min (ref >60)
Glucose, Bld: 109 mg/dL — ABNORMAL HIGH (ref 65–99)
Potassium: 3.6 mmol/L (ref 3.5–5.1)
Sodium: 139 mmol/L (ref 135–145)
Total Bilirubin: 0.3 mg/dL (ref 0.3–1.2)
Total Protein: 6.3 g/dL — ABNORMAL LOW (ref 6.5–8.1)

## 2017-04-03 LAB — DIFFERENTIAL
Basophils Absolute: 0.1 10*3/uL (ref 0.0–0.1)
Basophils Relative: 1 %
Eosinophils Absolute: 0.2 10*3/uL (ref 0.0–0.7)
Eosinophils Relative: 2 %
Lymphocytes Relative: 20 %
Lymphs Abs: 1.8 10*3/uL (ref 0.7–4.0)
Monocytes Absolute: 0.9 10*3/uL (ref 0.1–1.0)
Monocytes Relative: 10 %
Neutro Abs: 6.4 10*3/uL (ref 1.7–7.7)
Neutrophils Relative %: 67 %

## 2017-04-03 LAB — ETHANOL: Alcohol, Ethyl (B): <10 mg/dL (ref <10)

## 2017-04-03 LAB — CBC
HCT: 36.3 % (ref 36.0–46.0)
Hemoglobin: 11.8 g/dL — ABNORMAL LOW (ref 12.0–15.0)
MCH: 29.4 pg (ref 26.0–34.0)
MCHC: 32.5 g/dL (ref 30.0–36.0)
MCV: 90.5 fL (ref 78.0–100.0)
Platelets: 164 10*3/uL (ref 150–400)
RBC: 4.01 MIL/uL (ref 3.87–5.11)
RDW: 13.6 % (ref 11.5–15.5)
WBC: 9.4 10*3/uL (ref 4.0–10.5)

## 2017-04-03 LAB — I-STAT CHEM 8, ED
BUN: 23 mg/dL — ABNORMAL HIGH (ref 6–20)
Calcium, Ion: 1.21 mmol/L (ref 1.15–1.40)
Chloride: 108 mmol/L (ref 101–111)
Creatinine, Ser: 0.7 mg/dL (ref 0.44–1.00)
Glucose, Bld: 111 mg/dL — ABNORMAL HIGH (ref 65–99)
HCT: 34 % — ABNORMAL LOW (ref 36.0–46.0)
Hemoglobin: 11.6 g/dL — ABNORMAL LOW (ref 12.0–15.0)
Potassium: 3.6 mmol/L (ref 3.5–5.1)
Sodium: 143 mmol/L (ref 135–145)
TCO2: 23 mmol/L (ref 22–32)

## 2017-04-03 LAB — APTT: aPTT: 28 seconds (ref 24–36)

## 2017-04-03 LAB — PROTIME-INR
INR: 1.08
Prothrombin Time: 14 seconds (ref 11.4–15.2)

## 2017-04-03 LAB — I-STAT TROPONIN, ED: Troponin i, poc: 0 ng/mL (ref 0.00–0.08)

## 2017-04-03 NOTE — Consult Note (Signed)
Referring Physician: Dr. Melynda RippleHobbs    Chief Complaint: Acute worsening of right sided weakness  HPI: Kelly [name] is an 83 y.o. female who presented via EMS as a Code Stroke. LKN was 2100 on Saturday night. She was being helped from the bathroom by family when she suddenly was unable to stand due to RLE weakness. Family also noted that she could not move her RUE, so 911 was called. Of note, she has chronic RUE and RLE weakness secondary to prior left MCA stroke. She gets botox injections to her RUE to improve mobility.   In CT, the patient stated that her RUE was still weaker than normal, but that she was improving. Her RLE was back to normal per patient. She takes ASA daily. She is not on any anticoagulation.   Stroke risk factors include history of stroke, CHF, HLD and HTN.   LSN: 2100 tPA Given: No: Rapid symptomatic improvement. Risks of tPA outweigh benefits.  Past Medical History:  Diagnosis Date  . Acute blood loss anemia   . Adjustment disorder with depressed mood   . CVA (cerebral vascular accident) (HCC) 02/03/2016   Linear infarct within the posterior limb of left internal capsule  . Diastolic CHF (HCC)   . Dysphagia   . Dysphonia 02/20/2013  . Essential and other specified forms of tremor 02/20/2013  . HLD (hyperlipidemia)   . HTN (hypertension)   . OAB (overactive bladder)   . Osteoporosis   . Patient receiving subcutaneous heparin    For DVT prophylaxis 9/17  . Prediabetes   . Psoriasis   . Slow transit constipation   . Stroke (cerebrum) (HCC) 02/01/2016  . Trigeminal neuralgia 02/20/2013    Past Surgical History:  Procedure Laterality Date  . APPENDECTOMY  1940  . bladder tack    . CATARACT EXTRACTION Bilateral   . HEMORRHOID SURGERY  1960  . HUMERUS FRACTURE SURGERY    . LAPAROSCOPIC HYSTERECTOMY  2006  . torn rotator cuff    . WRIST FRACTURE SURGERY  2005   seconday shoulder 2001    Family History  Problem Relation Age of Onset  . Arthritis Brother       spine  . Osteoporosis Maternal Grandmother    Social History:  reports that  has never smoked. she has never used smokeless tobacco. She reports that she drinks alcohol. She reports that she does not use drugs.  Allergies: No Known Allergies  Medications:  Prior to Admission:  Medications Prior to Admission  Medication Sig Dispense Refill Last Dose  . aspirin 325 MG tablet Take 1 tablet (325 mg total) by mouth daily.   04/03/2017 at 0830  . atorvastatin (LIPITOR) 40 MG tablet Take 1 tablet (40 mg total) by mouth daily at 6 PM.   04/02/2017 at Unknown time  . FLUoxetine (PROZAC) 20 MG tablet TAKE ONE TABLET BY MOUTH ONCE DAILY 90 tablet 0 04/03/2017 at Unknown time  . ibuprofen (ADVIL,MOTRIN) 200 MG tablet Take 200 mg 2 (two) times daily by mouth.   04/03/2017 at Unknown time  . oxybutynin (DITROPAN) 5 MG tablet Take 5 mg at bedtime by mouth.   04/02/2017 at Unknown time  . topiramate (TOPAMAX) 50 MG tablet Take 50 mg by mouth 2 (two) times daily.   04/03/2017 at am   Scheduled: . aspirin  325 mg Oral Daily  . atorvastatin  40 mg Oral q1800  . enoxaparin (LOVENOX) injection  30 mg Subcutaneous Daily  . FLUoxetine  20 mg Oral Daily  .  insulin aspart  0-15 Units Subcutaneous TID WC  . oxybutynin  5 mg Oral QHS  . topiramate  50 mg Oral BID    ROS: No headache, speech deficit, confusion, SOB, CP, N/V or abdominal pain. Other ROS as per HPI.    Physical Examination: SpO2 100 %.  HEENT: [place]/AT Lungs: Respirations unlabored Ext: Warm and well perfused  Neurologic Examination: Mental Status: Alert, fully oriented.  Speech fluent with intact comprehension and naming. Able to follow 2 step directional command without difficulty. Cranial Nerves: II:  Visual fields intact. PERRL. III,IV, VI: EOMI without nystagmus V,VII: Mild right facial droop. Facial temp sensation normal bilaterally VIII: hearing intact to voice IX,X: Mild hypophonia XI: delayed shoulder shrug on the right XII:  midline tongue extension Motor: RUE: 4-/5 proximal and distal with increased tone RLE: 4/5 proximal and distal LUE and LLE: 4+/5    Cogwheeling noted to LUE in conjunction with medium amplitude approximately 5 Hz tremor Sensory: Decreased temp and FT sensation on the right. Extinction on the right with some trials.  Deep Tendon Reflexes:  1+ biceps and brachioradialis bilaterally. 1+ patellae bilaterally. 0 achilles bilaterally.  Cerebellar: No ataxia with FNF bilaterally. Has difficulty performing on right due to weakness.  Gait: Deferred  CT head:  1. No acute intracranial process. 2. Moderate to severe chronic small vessel ischemic disease and old lacunar infarcts. 3. Small areas RIGHT frontoparietal encephalomalacia most compatible old RIGHT MCA territory infarcts. 4. ASPECTS is 10.  Assessment: 83 y.o. female with acute onset of worsened RUE and RLE weakness, rapidly resolving 1. CT head shows no acute abnormality. Diffuse cerebral atrophy, chronic microvascular ischemic changes and old ischemic infarction within the posterior limb of the internal capsule are noted.  2. Stroke Risk Factors - History of stroke, CHF, HLD and HTN.  Plan: 1. HgbA1c, fasting lipid panel 2. MRI, MRA of the brain without contrast 3. PT consult, OT consult, Speech consult 4. Echocardiogram 5. Carotid dopplers 6. Switch ASA to Plavix. If severe intracranial atherosclerosis is present on MRA, then DAPT may be indicated 7. Risk factor modification 8. Telemetry monitoring 9. Frequent neuro checks 9. Continue atorvastatin 10. Permissive HTN x 24 hours  @Electronically  signed: Dr. Caryl PinaEric Tamar Lipscomb@  04/03/2017, 10:19 PM

## 2017-04-03 NOTE — ED Provider Notes (Signed)
[facility] EMERGENCY DEPARTMENT Provider Note   CSN: [id number] Arrival date & time: 04/03/17  2210   An emergency department physician performed an initial assessment on this suspected stroke patient at 2212.  History   Chief Complaint Chief Complaint  Patient presents with  . Code Stroke    HPI Kelly [name] is a 83 y.o. female.  Kelly [name] is a 83 y.o. Female who presents to the emergency department as a code stroke today via EMS.  Patient and husband report that patient has some chronic right-sided deficits after previous stroke.  They report today they were helping her from the bathroom when she was unable to stand due to right leg weakness and was unable to move her right arm.  They then called 911.  Patient reports that her right leg strength seems to be back to normal and that her right arm seems to be much weaker than usual.  She does tell me that she has some chronic deficits to her right side but reports the right-sided arm weakness seems to be greater than normal.  She denies any headache.  She is not on any anticoagulants.  She takes his full strength aspirin daily.  No recent head trauma or illness.  Patient denies fevers, coughing, shortness of breath, chest pain, abdominal pain, nausea, vomiting, diarrhea, rashes, urinary symptoms or headache.   The history is provided by the patient, medical records, the spouse and a friend. No language interpreter was used.    Past Medical History:  Diagnosis Date  . Acute blood loss anemia   . Adjustment disorder with depressed mood   . CVA (cerebral vascular accident) (HCC) 02/03/2016   Linear infarct within the posterior limb of left internal capsule  . Diastolic CHF (HCC)   . Dysphagia   . Dysphonia 02/20/2013  . Essential and other specified forms of tremor 02/20/2013  . HLD (hyperlipidemia)   . HTN (hypertension)   . OAB (overactive bladder)   . Osteoporosis   . Patient receiving subcutaneous  heparin    For DVT prophylaxis 9/17  . Prediabetes   . Psoriasis   . Slow transit constipation   . Stroke (cerebrum) (HCC) 02/01/2016  . Trigeminal neuralgia 02/20/2013    Patient Active Problem List   Diagnosis Date Noted  . History of stroke 02/16/2017  . Abnormality of gait following cerebrovascular accident (CVA) 02/16/2017  . Slow transit constipation   . Acute blood loss anemia   . Adjustment disorder with depressed mood   . Cerebrovascular accident (CVA) (HCC) 02/03/2016  . Dysarthria, post-stroke   . Dysphagia, post-stroke   . Leukocytosis   . Prediabetes   . Right hemiparesis (HCC)   . Cerebral infarction due to unspecified mechanism   . Other secondary hypertension   . Overactive bladder   . Diastolic dysfunction   . Hyperlipidemia   . Essential hypertension   . Essential tremor   . Stroke (cerebrum) (HCC) 02/01/2016  . Facial droop due to stroke 02/01/2016  . Essential and other specified forms of tremor 02/20/2013  . Dysphonia 02/20/2013  . Trigeminal neuralgia 02/20/2013    Past Surgical History:  Procedure Laterality Date  . APPENDECTOMY  1940  . bladder tack    . CATARACT EXTRACTION Bilateral   . HEMORRHOID SURGERY  1960  . HUMERUS FRACTURE SURGERY    . LAPAROSCOPIC HYSTERECTOMY  2006  . torn rotator cuff    . WRIST FRACTURE SURGERY  2005   seconday shoulder  2001    OB History    No data available       Home Medications    Prior to Admission medications   Medication Sig Start Date End Date Taking? Authorizing Provider  aspirin 325 MG tablet Take 1 tablet (325 mg total) by mouth daily. 02/03/16  Yes Tyrone NineGrunz, Ryan B, MD  atorvastatin (LIPITOR) 40 MG tablet Take 1 tablet (40 mg total) by mouth daily at 6 PM. 02/03/16  Yes Tyrone NineGrunz, Ryan B, MD  FLUoxetine (PROZAC) 20 MG tablet TAKE ONE TABLET BY MOUTH ONCE DAILY 12/25/16  Yes Marcello FennelPatel, Ankit Anil, MD  ibuprofen (ADVIL,MOTRIN) 200 MG tablet Take 200 mg 2 (two) times daily by mouth.   Yes [provider]  oxybutynin (DITROPAN) 5 MG tablet Take 5 mg at bedtime by mouth.   Yes [provider]  topiramate (TOPAMAX) 50 MG tablet Take 50 mg by mouth 2 (two) times daily.   Yes [provider]    Family History Family History  Problem Relation Age of Onset  . Arthritis Brother        spine  . Osteoporosis Maternal Grandmother     Social History Social History   Tobacco Use  . Smoking status: Never Smoker  . Smokeless tobacco: Never Used  Substance Use Topics  . Alcohol use: Yes    Comment: Wine 2 glass daily  . Drug use: No     Allergies   Patient has no known allergies.   Review of Systems Review of Systems  Constitutional: Negative for chills and fever.  HENT: Negative for congestion and sore throat.   Eyes: Negative for visual disturbance.  Respiratory: Negative for cough and shortness of breath.   Cardiovascular: Negative for chest pain.  Gastrointestinal: Negative for abdominal pain, diarrhea, nausea and vomiting.  Genitourinary: Negative for dysuria.  Musculoskeletal: Negative for back pain and neck pain.  Skin: Negative for rash.  Neurological: Positive for weakness. Negative for dizziness, syncope, facial asymmetry, numbness and headaches.     Physical Exam Updated Vital Signs BP 110/74   Pulse 72   Temp 98 F (36.7 C) (Oral)   Resp 19   Wt 45.2 kg (99 lb 11.2 oz)   SpO2 97%   BMI 19.47 kg/m   Physical Exam  Constitutional: She is oriented to person, place, and time. She appears well-developed and well-nourished. No distress.  Nontoxic-appearing.  HENT:  Head: Normocephalic and atraumatic.  Right Ear: External ear normal.  Left Ear: External ear normal.  Mouth/Throat: Oropharynx is clear and moist.  Eyes: Conjunctivae and EOM are normal. Pupils are equal, round, and reactive to light. Right eye exhibits no discharge. Left eye exhibits no discharge.  Neck: Neck supple.  Cardiovascular: Normal rate, regular rhythm,  normal heart sounds and intact distal pulses. Exam reveals no gallop and no friction rub.  No murmur heard. Pulmonary/Chest: Effort normal. No respiratory distress. She has no wheezes.  Slight crackle noted to her left base.  No increased work of breathing.  Symmetric chest expansion bilaterally.  Abdominal: Soft. There is no tenderness. There is no guarding.  Musculoskeletal: She exhibits no edema, tenderness or deformity.  Lymphadenopathy:    She has no cervical adenopathy.  Neurological: She is alert and oriented to person, place, and time. No cranial nerve deficit or sensory deficit. GCS eye subscore is 4. GCS verbal subscore is 5. GCS motor subscore is 6.  Patient is unable to raise her right arm above her head.  She has  movement to her right arm and hand, but limited.  She is good grip strength to her left hand that is reduced in her right.  She is am able to do right finger to nose.  Left finger to nose intact.  Cranial nerves are intact.  Speech is clear and coherent.  She is alert and oriented x3.  She has about 4 out of 5 strength to her right leg and 5 out of 5 strength to her left leg on exam.  Skin: Skin is warm and dry. No rash noted. She is not diaphoretic. No erythema. No pallor.  Psychiatric: She has a normal mood and affect. Her behavior is normal.  Nursing note and vitals reviewed.    ED Treatments / Results  Labs (all labs ordered are listed, but only abnormal results are displayed) Labs Reviewed  CBC - Abnormal; Notable for the following components:      Result Value   Hemoglobin 11.8 (*)    All other components within normal limits  COMPREHENSIVE METABOLIC PANEL - Abnormal; Notable for the following components:   Glucose, Bld 109 (*)    BUN 23 (*)    Calcium 8.7 (*)    Total Protein 6.3 (*)    Albumin 3.3 (*)    All other components within normal limits  I-STAT CHEM 8, ED - Abnormal; Notable for the following components:   BUN 23 (*)    Glucose, Bld 111 (*)     Hemoglobin 11.6 (*)    HCT 34.0 (*)    All other components within normal limits  ETHANOL  DIFFERENTIAL  PROTIME-INR  APTT  RAPID URINE DRUG SCREEN, HOSP PERFORMED  URINALYSIS, ROUTINE W REFLEX MICROSCOPIC  HEMOGLOBIN A1C  LIPID PANEL  CBC  CREATININE, SERUM  I-STAT TROPONIN, ED    EKG  EKG Interpretation  Date/Time:  Saturday April 03 2017 22:36:45 EST Ventricular Rate:  75 PR Interval:    QRS Duration: 116 QT Interval:  442 QTC Calculation: 494 R Axis:   -73 Text Interpretation:  Sinus rhythm Atrial premature complex Left anterior fascicular block Anterior infarct, old Artifact in lead(s) I III aVR aVL aVF Abnormal ekg Confirmed by Gerhard Munch [phone]) on 04/03/2017 10:40:12 PM       Radiology Dg Chest 2 View  Result Date: 04/03/2017 CLINICAL DATA:  Code stroke, crackles on lung exam EXAM: CHEST  2 VIEW COMPARISON:  02/01/2016 FINDINGS: Low lung volumes. No focal pulmonary infiltrate, consolidation or effusion. Cardiomediastinal silhouette within normal limits. Aortic atherosclerosis. No pneumothorax. Calcified breast implants. Stable mild wedge deformities of mid to upper thoracic vertebra. IMPRESSION: No active cardiopulmonary disease. Electronically Signed   By: Jasmine Pang M.D.   On: 04/03/2017 23:46   Ct Head Code Stroke Wo Contrast  Result Date: 04/03/2017 CLINICAL DATA:  Code stroke.  RIGHT-sided weakness. EXAM: CT HEAD WITHOUT CONTRAST TECHNIQUE: Contiguous axial images were obtained from the base of the skull through the vertex without intravenous contrast. COMPARISON:  CT HEAD February 01, 2016 and MRI of the head February 01, 2016 FINDINGS: BRAIN: No intraparenchymal hemorrhage, mass effect nor midline shift. Confluent similar supratentorial and pontine white matter hypodensities. No acute large vascular territory infarcts. Small areas RIGHT frontoparietal encephalomalacia. Old LEFT posterior limb of in the internal capsule small infarct. Faint  hypodensities bilateral basal ganglia and thalamus a responding to perivascular spaces associated with chronic small vessel ischemic disease. No abnormal extra-axial fluid collections. Basal cisterns are patent. VASCULAR: Moderate calcific atherosclerosis of the carotid siphons.  SKULL: No skull fracture. No significant scalp soft tissue swelling. SINUSES/ORBITS: The mastoid air-cells and included paranasal sinuses are well-aerated.The included ocular globes and orbital contents are non-suspicious. OTHER: None. ASPECTS [facility]([place] Stroke Program Early CT Score) - Ganglionic level infarction (caudate, lentiform nuclei, internal capsule, insula, M1-M3 cortex): 7 - Supraganglionic infarction (M4-M6 cortex): 3 Total score (0-10 with 10 being normal): 10 IMPRESSION: 1. No acute intracranial process. 2. Moderate to severe chronic small vessel ischemic disease and old lacunar infarcts. 3. Small areas RIGHT frontoparietal encephalomalacia most compatible old RIGHT MCA territory infarcts. 4. ASPECTS is 10. 5. Dr. Otelia LimesLindzen, Neurology paged [facility]aging [facility] on 04/03/2017 at 10:28 pm, awaiting return call. Electronically Signed   By: Awilda Metroourtnay  Bloomer M.D.   On: 04/03/2017 22:32    Procedures Procedures (including critical care time)  Medications Ordered in ED Medications  aspirin tablet 325 mg (not administered)  atorvastatin (LIPITOR) tablet 40 mg (not administered)  FLUoxetine (PROZAC) tablet 20 mg (not administered)  oxybutynin (DITROPAN) tablet 5 mg (not administered)  topiramate (TOPAMAX) tablet 50 mg (not administered)   stroke: mapping our early stages of recovery book (not administered)  0.9 %  sodium chloride infusion (not administered)  acetaminophen (TYLENOL) tablet 650 mg (not administered)    Or  acetaminophen (TYLENOL) solution 650 mg (not administered)    Or  acetaminophen (TYLENOL) suppository 650 mg (not administered)  senna-docusate (Senokot-S) tablet 1 tablet (not  administered)  enoxaparin (LOVENOX) injection 40 mg (not administered)     Initial Impression / Assessment and Plan / ED Course  I have reviewed the triage vital signs and the nursing notes.  Pertinent labs & imaging results that were available during my care of the patient were reviewed by me and considered in my medical decision making (see chart for details).    This  is a 83 y.o. Female who presents to the emergency department as a code stroke today via EMS.  Patient and husband report that patient has some chronic right-sided deficits after previous stroke.  They report today they were helping her from the bathroom when she was unable to stand due to right leg weakness and was unable to move her right arm.  They then called 911.  Patient reports that her right leg strength seems to be back to normal and that her right arm seems to be much weaker than usual.  She does tell me that she has some chronic deficits to her right side but reports the right-sided arm weakness seems to be greater than normal.  She denies any headache.  She is not on any anticoagulants.  On exam the patient is afebrile nontoxic-appearing.  She does have reduced strength to her right arm and leg.  About 4 out of 5 strength to her right leg and 5 out of 5 strength to her left leg.  Patient has difficulty moving her right arm, but does have minimal movement.  Patient reports this is not normal for her.  She does have some chronic deficits she reports that not this severe.  No facial droop.  Cranial nerves are intact.   Patient does have a slight crackle noted to her left base on lung exam.  She denies any chest pain, shortness of breath or trouble breathing.  Chest x-ray is unremarkable. CT head shows no evidence of acute stroke or bleed. Neurology recommends inpatient admission for continued stroke workup to include MRI and MRA of her brain.  These were ordered and patient  and family agree with plan for admission.  I  consulted with Dr. Melynda Ripple from hospitalist service who accepted the patient for admission.  This patient was discussed with and evaluated by Dr. Jeraldine Loots who agrees with assessment and plan.  Final Clinical Impressions(s) / ED Diagnoses   Final diagnoses:  Stroke-like symptoms  Right sided weakness    ED Discharge Orders    None       Everlene Farrier, PA-C 04/04/17 0043    Gerhard Munch, MD 04/04/17 [phone]

## 2017-04-03 NOTE — ED Triage Notes (Signed)
Pt arrives via EMS for code stroke. Last seen normal 2100. Pt has history of previous CVA with right sided deficits. Family stated the patients right side was weaker.

## 2017-04-03 NOTE — ED Notes (Signed)
Patient transported to X-ray 

## 2017-04-03 NOTE — ED Notes (Signed)
Attempted to call report

## 2017-04-03 NOTE — ED Notes (Signed)
Family at bedside. Neurohospitalist notified of families presence for him to speak to.

## 2017-04-03 NOTE — H&P (Signed)
[facility] History and Physical  Lorretta HarpBennie H Peatross UEA:[id number] DOB: [birth year] DOA: 04/03/2017  Referring physician:  PCP: Gaspar Garbeisovec, Richard W, MD   Chief Complaint: R side weakness  HPI: Kelly HumphreysBennie H [name] is a 83 y.o. female with past medical history significant for stroke with right-sided deficits, heart failure, hypertension and prediabetes presents emergency room with chief complaint of worsening right-sided weakness.  Patient states that she was in her normal state of health today.  Said acute onset of right-sided weakness.  This improved and turned into dense right upper extremity weakness.  Patient states that during her acute phase of her weakness she had trouble ambulating.  EMS activated.  Patient does take full dose aspirin daily.   Review of Systems:  As per HPI otherwise 10 point review of systems negative.    Past Medical History:  Diagnosis Date  . Acute blood loss anemia   . Adjustment disorder with depressed mood   . CVA (cerebral vascular accident) (HCC) 02/03/2016   Linear infarct within the posterior limb of left internal capsule  . Diastolic CHF (HCC)   . Dysphagia   . Dysphonia 02/20/2013  . Essential and other specified forms of tremor 02/20/2013  . HLD (hyperlipidemia)   . HTN (hypertension)   . OAB (overactive bladder)   . Osteoporosis   . Patient receiving subcutaneous heparin    For DVT prophylaxis 9/17  . Prediabetes   . Psoriasis   . Slow transit constipation   . Stroke (cerebrum) (HCC) 02/01/2016  . Trigeminal neuralgia 02/20/2013   Past Surgical History:  Procedure Laterality Date  . APPENDECTOMY  1940  . bladder tack    . CATARACT EXTRACTION Bilateral   . HEMORRHOID SURGERY  1960  . HUMERUS FRACTURE SURGERY    . LAPAROSCOPIC HYSTERECTOMY  2006  . torn rotator cuff    . WRIST FRACTURE SURGERY  2005   seconday shoulder 2001   Social History:  reports that  has never smoked. she has never used smokeless tobacco. She reports that she  drinks alcohol. She reports that she does not use drugs.  No Known Allergies  Family History  Problem Relation Age of Onset  . Arthritis Brother        spine  . Osteoporosis Maternal Grandmother      Prior to Admission medications   Medication Sig Start Date End Date Taking? Authorizing Provider  aspirin 325 MG tablet Take 1 tablet (325 mg total) by mouth daily. 02/03/16  Yes Tyrone NineGrunz, Ryan B, MD  atorvastatin (LIPITOR) 40 MG tablet Take 1 tablet (40 mg total) by mouth daily at 6 PM. 02/03/16  Yes Tyrone NineGrunz, Ryan B, MD  FLUoxetine (PROZAC) 20 MG tablet TAKE ONE TABLET BY MOUTH ONCE DAILY 12/25/16  Yes Marcello FennelPatel, Ankit Anil, MD  ibuprofen (ADVIL,MOTRIN) 200 MG tablet Take 200 mg 2 (two) times daily by mouth.   Yes [provider]  oxybutynin (DITROPAN) 5 MG tablet Take 5 mg at bedtime by mouth.   Yes [provider]  topiramate (TOPAMAX) 50 MG tablet Take 50 mg by mouth 2 (two) times daily.   Yes [provider]   Physical Exam: Vitals:   04/03/17 2217 04/03/17 2244 04/03/17 2258 04/03/17 2300  BP:  116/72  118/64  Pulse:  74  73  Resp:  18  (!) 22  Temp:  98 F (36.7 C)    TempSrc:  Oral    SpO2: 100% 97%  97%  Weight:   45.2 kg (99 lb  11.2 oz)     Wt Readings from Last 3 Encounters:  04/03/17 45.2 kg (99 lb 11.2 oz)  02/16/17 45.1 kg (99 lb 6.4 oz)  08/06/16 48.3 kg (106 lb 6.4 oz)    General:  Appears calm and comfortable; a&Ox3 Eyes:  PERRL, EOMI, normal lids, iris ENT:  grossly normal hearing, lips & tongue Neck:  no LAD, masses or thyromegaly Cardiovascular:  RRR, no m/r/g. No LE edema.  Respiratory:  CTA bilaterally, no w/r/r. Normal respiratory effort. Abdomen:  soft, ntnd Skin:  no rash or induration seen on limited exam Musculoskeletal:  grossly normal tone BUE/BLE Psychiatric:  grossly normal mood and affect, speech fluent and appropriate Neurologic:  CN 2-12 grossly intact, moves lower extremities in coordinated fashion. RUE nl. LUE weak.            Labs on Admission:  Basic Metabolic Panel: Recent Labs  Lab 04/03/17 2214 04/03/17 2223  NA 139 143  K 3.6 3.6  CL 111 108  CO2 23  --   GLUCOSE 109* 111*  BUN 23* 23*  CREATININE 0.77 0.70  CALCIUM 8.7*  --    Liver Function Tests: Recent Labs  Lab 04/03/17 2214  AST 20  ALT 20  ALKPHOS 74  BILITOT 0.3  PROT 6.3*  ALBUMIN 3.3*   No results for input(s): LIPASE, AMYLASE in the last 168 hours. No results for input(s): AMMONIA in the last 168 hours. CBC: Recent Labs  Lab 04/03/17 2214 04/03/17 2223  WBC 9.4  --   NEUTROABS 6.4  --   HGB 11.8* 11.6*  HCT 36.3 34.0*  MCV 90.5  --   PLT 164  --    Cardiac Enzymes: No results for input(s): CKTOTAL, CKMB, CKMBINDEX, TROPONINI in the last 168 hours.  BNP (last 3 results) No results for input(s): BNP in the last 8760 hours.  ProBNP (last 3 results) No results for input(s): PROBNP in the last 8760 hours.   Creatinine clearance cannot be calculated (Unknown ideal weight.)  CBG: No results for input(s): GLUCAP in the last 168 hours.  Radiological Exams on Admission: Dg Chest 2 View  Result Date: 04/03/2017 CLINICAL DATA:  Code stroke, crackles on lung exam EXAM: CHEST  2 VIEW COMPARISON:  02/01/2016 FINDINGS: Low lung volumes. No focal pulmonary infiltrate, consolidation or effusion. Cardiomediastinal silhouette within normal limits. Aortic atherosclerosis. No pneumothorax. Calcified breast implants. Stable mild wedge deformities of mid to upper thoracic vertebra. IMPRESSION: No active cardiopulmonary disease. Electronically Signed   By: Jasmine Pang M.D.   On: 04/03/2017 23:46   Ct Head Code Stroke Wo Contrast  Result Date: 04/03/2017 CLINICAL DATA:  Code stroke.  RIGHT-sided weakness. EXAM: CT HEAD WITHOUT CONTRAST TECHNIQUE: Contiguous axial images were obtained from the base of the skull through the vertex without intravenous contrast. COMPARISON:  CT HEAD February 01, 2016 and MRI of the head  February 01, 2016 FINDINGS: BRAIN: No intraparenchymal hemorrhage, mass effect nor midline shift. Confluent similar supratentorial and pontine white matter hypodensities. No acute large vascular territory infarcts. Small areas RIGHT frontoparietal encephalomalacia. Old LEFT posterior limb of in the internal capsule small infarct. Faint hypodensities bilateral basal ganglia and thalamus a responding to perivascular spaces associated with chronic small vessel ischemic disease. No abnormal extra-axial fluid collections. Basal cisterns are patent. VASCULAR: Moderate calcific atherosclerosis of the carotid siphons. SKULL: No skull fracture. No significant scalp soft tissue swelling. SINUSES/ORBITS: The mastoid air-cells and included paranasal sinuses are well-aerated.The included ocular globes and orbital contents  are non-suspicious. OTHER: None. ASPECTS [facility]([place] Stroke Program Early CT Score) - Ganglionic level infarction (caudate, lentiform nuclei, internal capsule, insula, M1-M3 cortex): 7 - Supraganglionic infarction (M4-M6 cortex): 3 Total score (0-10 with 10 being normal): 10 IMPRESSION: 1. No acute intracranial process. 2. Moderate to severe chronic small vessel ischemic disease and old lacunar infarcts. 3. Small areas RIGHT frontoparietal encephalomalacia most compatible old RIGHT MCA territory infarcts. 4. ASPECTS is 10. 5. Dr. Otelia LimesLindzen, Neurology paged [facility]aging [facility] on 04/03/2017 at 10:28 pm, awaiting return call. Electronically Signed   By: Awilda Metroourtnay  Bloomer M.D.   On: 04/03/2017 22:32    EKG: Independently reviewed. NSR. No STEMI.  Assessment/Plan Principal Problem:   Stroke (cerebrum) (HCC) Active Problems:   Hyperlipidemia   Essential hypertension   Prediabetes   Overactive bladder   Stroke CT head negative for acute bleed MRI ordered Aspirin full dose given  And daily A1c ordered Lipid panel ordered Admitted to telemetry bed Echo ordered Carotid Dopplers  ordered MRA ordered Stroke team to follow patient Prn hydralazine for HTN CXR neg UA & UDS pending PT/OT/Speech Consults  PreDM SSI AC  Hyperlipidemia Continue statin  Depression Cont prozac No SI/HI  Bladder overactive Cont Ditropan  Code Status: FC  DVT Prophylaxis: lovenox Family Communication: none at bedside Disposition Plan: Pending Improvement  Status:  Inpt tele  Haydee SalterPhillip M Deontay Ladnier, MD Family Medicine [facility] [email or web address] Password [email or web address]

## 2017-04-03 NOTE — ED Notes (Signed)
QNS collect blue top

## 2017-04-04 ENCOUNTER — Encounter (HOSPITAL_COMMUNITY): Payer: Self-pay

## 2017-04-04 ENCOUNTER — Inpatient Hospital Stay (HOSPITAL_COMMUNITY): Payer: Medicare Other

## 2017-04-04 DIAGNOSIS — I6300 Cerebral infarction due to thrombosis of unspecified precerebral artery: Secondary | ICD-10-CM

## 2017-04-04 LAB — GLUCOSE, CAPILLARY
Glucose-Capillary: 82 mg/dL (ref 65–99)
Glucose-Capillary: 104 mg/dL — ABNORMAL HIGH (ref 65–99)
Glucose-Capillary: 107 mg/dL — ABNORMAL HIGH (ref 65–99)
Glucose-Capillary: 91 mg/dL (ref 65–99)

## 2017-04-04 LAB — URINALYSIS, ROUTINE W REFLEX MICROSCOPIC
Bilirubin Urine: NEGATIVE
Glucose, UA: NEGATIVE mg/dL
Hgb urine dipstick: NEGATIVE
Ketones, ur: NEGATIVE mg/dL
Leukocytes, UA: NEGATIVE
Nitrite: NEGATIVE
Protein, ur: NEGATIVE mg/dL
Specific Gravity, Urine: 1.023 (ref 1.005–1.030)
pH: 6 (ref 5.0–8.0)

## 2017-04-04 LAB — LIPID PANEL
Cholesterol: 99 mg/dL (ref 0–200)
HDL: 33 mg/dL — ABNORMAL LOW (ref >40)
LDL Cholesterol: 58 mg/dL (ref 0–99)
Total CHOL/HDL Ratio: 3 RATIO
Triglycerides: 38 mg/dL (ref <150)
VLDL: 8 mg/dL (ref 0–40)

## 2017-04-04 LAB — RAPID URINE DRUG SCREEN, HOSP PERFORMED
Amphetamines: NOT DETECTED
Barbiturates: NOT DETECTED
Benzodiazepines: NOT DETECTED
Cocaine: NOT DETECTED
Opiates: NOT DETECTED
Tetrahydrocannabinol: NOT DETECTED

## 2017-04-04 LAB — HEMOGLOBIN A1C
Hgb A1c MFr Bld: 5.6 % (ref 4.8–5.6)
Mean Plasma Glucose: 114.02 mg/dL

## 2017-04-04 MED ORDER — FLUOXETINE HCL 20 MG PO CAPS
20.0000 mg | ORAL_CAPSULE | Freq: Every day | ORAL | Status: DC
  Administered 2017-04-04 – 2017-04-05 (×2): 20 mg via ORAL
  Filled 2017-04-04 (×2): qty 1

## 2017-04-04 MED ORDER — HYDRALAZINE HCL 20 MG/ML IJ SOLN: 10.0000 mg | Freq: Three times a day (TID) | INTRAMUSCULAR | Status: DC | PRN

## 2017-04-04 MED ORDER — ACETAMINOPHEN 160 MG/5ML PO SOLN: 650.0000 mg | ORAL | Status: DC | PRN

## 2017-04-04 MED ORDER — ENOXAPARIN SODIUM 30 MG/0.3ML ~~LOC~~ SOLN
30.0000 mg | Freq: Every day | SUBCUTANEOUS | Status: DC
  Administered 2017-04-04 – 2017-04-05 (×2): 30 mg via SUBCUTANEOUS
  Filled 2017-04-04 (×2): qty 0.3

## 2017-04-04 MED ORDER — TOPIRAMATE 25 MG PO TABS
50.0000 mg | ORAL_TABLET | Freq: Two times a day (BID) | ORAL | Status: DC
  Administered 2017-04-04 – 2017-04-05 (×4): 50 mg via ORAL
  Filled 2017-04-04 (×5): qty 2

## 2017-04-04 MED ORDER — ACETAMINOPHEN 650 MG RE SUPP: 650.0000 mg | RECTAL | Status: DC | PRN

## 2017-04-04 MED ORDER — SENNOSIDES-DOCUSATE SODIUM 8.6-50 MG PO TABS: 1.0000 | ORAL_TABLET | Freq: Every evening | ORAL | Status: DC | PRN

## 2017-04-04 MED ORDER — OXYBUTYNIN CHLORIDE 5 MG PO TABS
5.0000 mg | ORAL_TABLET | Freq: Every day | ORAL | Status: DC
  Administered 2017-04-04 (×2): 5 mg via ORAL
  Filled 2017-04-04 (×2): qty 1

## 2017-04-04 MED ORDER — INSULIN ASPART 100 UNIT/ML ~~LOC~~ SOLN: 0.0000 [IU] | Freq: Three times a day (TID) | SUBCUTANEOUS | Status: DC

## 2017-04-04 MED ORDER — ATORVASTATIN CALCIUM 40 MG PO TABS
40.0000 mg | ORAL_TABLET | Freq: Every day | ORAL | Status: DC
  Administered 2017-04-04: 40 mg via ORAL
  Filled 2017-04-04: qty 1

## 2017-04-04 MED ORDER — SODIUM CHLORIDE 0.9 % IV SOLN
INTRAVENOUS | Status: AC
  Administered 2017-04-04 (×2): via INTRAVENOUS

## 2017-04-04 MED ORDER — STROKE: EARLY STAGES OF RECOVERY BOOK
Freq: Once | Status: AC
  Administered 2017-04-04: 02:00:00
  Filled 2017-04-04: qty 1

## 2017-04-04 MED ORDER — ACETAMINOPHEN 325 MG PO TABS: 650.0000 mg | ORAL_TABLET | ORAL | Status: DC | PRN

## 2017-04-04 MED ORDER — ASPIRIN 325 MG PO TABS
325.0000 mg | ORAL_TABLET | Freq: Every day | ORAL | Status: DC
  Administered 2017-04-04: 325 mg via ORAL
  Filled 2017-04-04: qty 1

## 2017-04-04 MED ORDER — CLOPIDOGREL BISULFATE 75 MG PO TABS
75.0000 mg | ORAL_TABLET | Freq: Every day | ORAL | Status: DC
  Administered 2017-04-04 – 2017-04-05 (×2): 75 mg via ORAL
  Filled 2017-04-04 (×2): qty 1

## 2017-04-04 MED ORDER — GADOBENATE DIMEGLUMINE 529 MG/ML IV SOLN
9.0000 mL | Freq: Once | INTRAVENOUS | Status: AC | PRN
  Administered 2017-04-04: 9 mL via INTRAVENOUS

## 2017-04-04 NOTE — Evaluation (Signed)
Physical Therapy Evaluation Patient Details Name: Kelly [name] MRN: [id number] DOB: 1927-07-16 Today's Date: 04/04/2017   History of Present Illness  Pt is an 83 yo female admitted through the ED on 03/03/17 with worsening right sided weakness. Pt has a history of CVA in 07/2015 resulting in right sided weakness with dysphagia and dysphonia. PMH significant for R sided CVA, HLD, HTN, OP, Trigeminal neuralgia.   Clinical Impression  Pt presents with the above diagnosis and below deficits for therapy evaluation. Prior to admission, pt lived with her 81 yo husband in two story home with her main living environment on one level. Pt is able to get into and out of the home with assistance and her RW. Pt was able to perform ADLS with set-up from spouse, required assistance from spouse with showering and performed short distance gait with assistance in her home and out to car for MD appointments. This session, pt requires Min A for bed mobility, transfers and short distance gait around bed to recliner. Pt is very encouraged by her mobility this session. Pt would like to return home, but may benefit from SNF for short term rehab in order to improve her mobility prior to returning home with her husband. Pt may require some paid assistance if she does return home in order to maximize her outcomes and reduce the burden on her husband initially. If pt does not go to SNF she will need home health PT referral. Pt continues to require acute PT follow-up in order to address the below deficits prior to D/C.    Follow Up Recommendations SNF;Supervision for mobility/OOB. Will need Home Health PT if doesn't agree to SNF.     Equipment Recommendations  None recommended by PT    Recommendations for Other Services       Precautions / Restrictions Precautions Precautions: Fall Restrictions Weight Bearing Restrictions: No      Mobility  Bed Mobility Overal bed mobility: Needs Assistance Bed Mobility: Supine to  Sit     Supine to sit: Min assist;HOB elevated     General bed mobility comments: Min A to move hips to EOB. Pt uses railing and is able to move LE's EOB with MIn guard for majority of mobility.   Transfers Overall transfer level: Needs assistance Equipment used: Rolling walker (2 wheeled) Transfers: Sit to/from Stand Sit to Stand: Min assist         General transfer comment: Min A to stand from EOB with assistance to place RUE onto RW. Pt requires multiple attempts to position RUE.   Ambulation/Gait Ambulation/Gait assistance: Min assist Ambulation Distance (Feet): 25 Feet Assistive device: Rolling walker (2 wheeled) Gait Pattern/deviations: Step-to pattern;Decreased step length - left;Decreased stance time - right;Decreased stride length;Trunk flexed;Narrow base of support Gait velocity: decreased Gait velocity interpretation: Below normal speed for age/gender General Gait Details: slow gait with forward trunk lean due to kyphotic posture. Minimal foot drop noted on RLE which is compensated with increased hip flexion. Cues for positioning within RW.   Stairs            Wheelchair Mobility    Modified Rankin (Stroke Patients Only) Modified Rankin (Stroke Patients Only) Pre-Morbid Rankin Score: Moderately severe disability Modified Rankin: Moderately severe disability     Balance Overall balance assessment: Needs assistance Sitting-balance support: No upper extremity supported;Feet supported Sitting balance-Leahy Scale: Fair     Standing balance support: Bilateral upper extremity supported Standing balance-Leahy Scale: Poor Standing balance comment: reliant on RW for stability in  standing                             Pertinent Vitals/Pain Pain Assessment: No/denies pain    Home Living Family/patient expects to be discharged to:: Private residence Living Arrangements: Spouse/significant other Available Help at Discharge: Family;Available 24  hours/day Type of Home: House Home Access: Ramped entrance;Stairs to enter     Home Layout: Able to live on main level with bedroom/bathroom;Two level Home Equipment: Walker - 2 wheels;Bedside commode;Tub bench;Walker - 4 wheels;Wheelchair - manual;Grab bars - toilet;Grab bars - tub/shower;Hand held shower head      Prior Function Level of Independence: Needs assistance   Gait / Transfers Assistance Needed: uses RW for gait throughout the home  ADL's / Homemaking Assistance Needed: Husband assists with ADLS and IADLS. Pt reports he sets out her clothes and she is able to manage to put them on herself.   Comments: husband assists with all activities except for a cleaning person who comes every other week.      Hand Dominance   Dominant Hand: Left    Extremity/Trunk Assessment   Upper Extremity Assessment Upper Extremity Assessment: Defer to OT evaluation    Lower Extremity Assessment Lower Extremity Assessment: RLE deficits/detail;LLE deficits/detail RLE Deficits / Details: weakness noted in RLE at least 3/5 grossly LLE Deficits / Details: weakness noted in LLE. At least 3+/5 grossly    Cervical / Trunk Assessment Cervical / Trunk Assessment: Kyphotic  Communication   Communication: No difficulties  Cognition Arousal/Alertness: Awake/alert Behavior During Therapy: WFL for tasks assessed/performed Overall Cognitive Status: Within Functional Limits for tasks assessed                                        General Comments      Exercises     Assessment/Plan    PT Assessment Patient needs continued PT services  PT Problem List Decreased strength;Decreased activity tolerance;Decreased balance;Decreased mobility       PT Treatment Interventions DME instruction;Gait training;Functional mobility training;Therapeutic activities;Therapeutic exercise;Balance training;Patient/family education    PT Goals (Current goals can be found in the Care Plan  section)  Acute Rehab PT Goals Patient Stated Goal: to go home  PT Goal Formulation: With patient Time For Goal Achievement: 04/18/17 Potential to Achieve Goals: Good    Frequency Min 4X/week   Barriers to discharge        Co-evaluation               AM-PAC PT "6 Clicks" Daily Activity  Outcome Measure Difficulty turning over in bed (including adjusting bedclothes, sheets and blankets)?: Unable Difficulty moving from lying on back to sitting on the side of the bed? : Unable Difficulty sitting down on and standing up from a chair with arms (e.g., wheelchair, bedside commode, etc,.)?: Unable Help needed moving to and from a bed to chair (including a wheelchair)?: A Lot Help needed walking in hospital room?: A Lot Help needed climbing 3-5 steps with a railing? : Total 6 Click Score: 8    End of Session Equipment Utilized During Treatment: Gait belt Activity Tolerance: Patient tolerated treatment well Patient left: in chair;with call bell/phone within reach Nurse Communication: Mobility status PT Visit Diagnosis: Unsteadiness on feet (R26.81);Muscle weakness (generalized) (M62.81);Difficulty in walking, not elsewhere classified (R26.2);Hemiplegia and hemiparesis Hemiplegia - Right/Left: Right Hemiplegia - dominant/non-dominant: Dominant Hemiplegia -  caused by: Cerebral infarction    Time: 0923-1002 PT Time Calculation (min) (ACUTE ONLY): 39 min   Charges:   PT Evaluation $PT Eval Moderate Complexity: 1 Mod PT Treatments $Gait Training: 8-22 mins $Therapeutic Activity: 8-22 mins   PT G Codes:        Colin BroachSabra M. Kamsiyochukwu Buist PT, DPT     Savina Olshefski Aletha HalimMarie Sukhdeep Wieting 04/04/2017, 10:19 AM

## 2017-04-04 NOTE — Plan of Care (Signed)
  No Outcome Acute Rehab PT Goals(only PT should resolve) Pt Will Go Sit To Supine/Side Description Pt will be able to perform supine <> with Min guard A from Min A in order to improve her mobility and assist with a return to PLOF.   04/04/2017 1340 by Roxy MannsKress, Eila Runyan Marie, PT Flowsheets Taken 04/04/2017 1340  Pt will go Sit to Supine/Side with min guard assist Patient Will Transfer Sit To/From Stand Description Pt will be able to perform sit<>stand with Min guard A to supervision from Min A in order to improve ability to return home and reduce caregiver burden.   04/04/2017 1340 by Roxy MannsKress, Krayton Wortley Marie, PT Flowsheets Taken 04/04/2017 1340  Patient will transfer sit to/from stand with min guard assist Pt Will Ambulate Description Pt will be able to perform gait x 100' with RW and Supervision in order to safely get into her home and maneuver around home to bathroom.   04/04/2017 1340 by Roxy MannsKress, Eriel Dunckel Marie, PT Flowsheets Taken 04/04/2017 1340  Pt will Ambulate > 125 feet;with min guard assist;with least restrictive assistive device Pt/caregiver will Perform Home Exercise Program Description Pt and caregiver will be independent in HEP and instructions provided by PT.   04/04/2017 1340 by Roxy MannsKress, Tenzin Pavon Marie, PT Flowsheets Taken 04/04/2017 1340  Pt/caregiver will Perform Home Exercise Program For increased strengthening;For improved balance;With Supervision, verbal cues required/provided

## 2017-04-04 NOTE — Progress Notes (Signed)
Pt admitted to 3W14. No c/o pain, alert and oriented X 4.  Family at bedside. Welcomed and oriented to unit. Will continue to monitor.

## 2017-04-04 NOTE — Progress Notes (Signed)
STROKE TEAM PROGRESS NOTE   HISTORY OF PRESENT ILLNESS (per record) Kelly [name] is an 83 y.o. female who presented via EMS as a Code Stroke. LKN was 2100 on Saturday night. She was being helped from the bathroom by family when she suddenly was unable to stand due to RLE weakness. Family also noted that she could not move her RUE, so 911 was called. Of note, she has chronic RUE and RLE weakness secondary to prior left MCA stroke. She gets botox injections to her RUE to improve mobility.   In CT, the patient stated that her RUE was still weaker than normal, but that she was improving. Her RLE was back to normal per patient. She takes ASA daily. She is not on any anticoagulation.   Stroke risk factors include history of stroke, CHF, HLD and HTN.   LSN: 2100 tPA Given: No: Rapid symptomatic improvement. Risks of tPA outweigh benefits.     SUBJECTIVE (INTERVAL HISTORY) Her  Daughter, husband and son in law are  at the bedside.  Her right-sided weakness appears to be improving. MRI scan shows tiny punctate left parietal acute and subacute infarcts   OBJECTIVE Temp:  [98 F (36.7 C)-98.2 F (36.8 C)] 98 F (36.7 C) (11/11 0540) Pulse Rate:  [63-79] 63 (11/11 0540) Cardiac Rhythm: Normal sinus rhythm (11/11 0700) Resp:  [16-22] 16 (11/11 0540) BP: (110-163)/(59-76) 121/59 (11/11 0540) SpO2:  [97 %-100 %] 98 % (11/11 0540) Weight:  [97 lb 8 oz (44.2 kg)-99 lb 11.2 oz (45.2 kg)] 97 lb 8 oz (44.2 kg) (11/11 0140) Physical Exam frail elderly Caucasian lady not in distress. . Afebrile. Head is nontraumatic. Neck is supple without bruit.    Cardiac exam no murmur or gallop. Lungs are clear to auscultation. Distal pulses are well felt. Neurological Exam : awake alert oriented 3. Diminished attention and recall. No aphasia or apraxia dysarthria. Follows commands well. Extraocular moments are full range without nystagmus. Blinks to threat bilaterally. Mild right lower facial weakness. Tongue  midline. Spastic whole right hemiparesis with grade 3-4/5 right upper extremity and 4/5 right lower extremity strength. Weakness of right grip and intrinsic hand muscles. Orbits left over right upper extremity. Tone is increased on the right upper and lower extremity. Deep tendon reflexes are brisker on the right compared to the left. Right plantar upgoing left downgoing. Coordination is slightly impaired on the right. Sensation is intact. Gait not tested. CBC:  Recent Labs  Lab 04/03/17 2214 04/03/17 2223  WBC 9.4  --   NEUTROABS 6.4  --   HGB 11.8* 11.6*  HCT 36.3 34.0*  MCV 90.5  --   PLT 164  --     Basic Metabolic Panel:  Recent Labs  Lab 04/03/17 2214 04/03/17 2223  NA 139 143  K 3.6 3.6  CL 111 108  CO2 23  --   GLUCOSE 109* 111*  BUN 23* 23*  CREATININE 0.77 0.70  CALCIUM 8.7*  --     Lipid Panel:     Component Value Date/Time   CHOL 99 04/04/2017 0408   TRIG 38 04/04/2017 0408   HDL 33 (L) 04/04/2017 0408   CHOLHDL 3.0 04/04/2017 0408   VLDL 8 04/04/2017 0408   LDLCALC 58 04/04/2017 0408   HgbA1c:  Lab Results  Component Value Date   HGBA1C 5.6 04/04/2017   Urine Drug Screen:     Component Value Date/Time   LABOPIA NONE DETECTED 04/03/2017 2214   COCAINSCRNUR NONE DETECTED 04/03/2017 2214  LABBENZ NONE DETECTED 04/03/2017 2214   AMPHETMU NONE DETECTED 04/03/2017 2214   THCU NONE DETECTED 04/03/2017 2214   LABBARB NONE DETECTED 04/03/2017 2214    Alcohol Level     Component Value Date/Time   ETH <10 04/03/2017 2214    IMAGING  Dg Chest 2 View 04/03/2017 IMPRESSION:  No active cardiopulmonary disease.   Mr Angiogram Head Wo Contrast 04/04/2017 IMPRESSION:   MRI HEAD:  1. Subacute enhancing 4 mm LEFT parietal infarct. Subacute small LEFT parietal white matter infarct.  2. Small bifrontal and RIGHT parietal infarcts in MCA territories.  3. Severe chronic small vessel ischemic disease. Old lacunar infarcts.   MRA HEAD:  No emergent  large vessel occlusion or flow limiting stenosis.    Ct Head Code Stroke Wo Contrast 04/04/2017   IMPRESSION:  1. No acute intracranial process.  2. Moderate to severe chronic small vessel ischemic disease and old lacunar infarcts. 3. Small areas RIGHT frontoparietal encephalomalacia most compatible old RIGHT MCA territory infarcts.  4. ASPECTS is 10.    Bilateral Carotid Dopplers - pending   PHYSICAL EXAM Vitals:   04/04/17 0000 04/04/17 0140 04/04/17 0340 04/04/17 0540  BP: 110/74 (!) 163/76 140/71 (!) 121/59  Pulse: 72 79 70 63  Resp: 19 20 18 16   Temp:  98.1 F (36.7 C) 98.2 F (36.8 C) 98 F (36.7 C)  TempSrc:  Oral Oral Oral  SpO2: 97% 98% 100% 98%  Weight:  97 lb 8 oz (44.2 kg)    Height:  5' (1.524 m)       ASSESSMENT/PLAN Kelly [name] is a 83 y.o. female with history of previous strokes, CHF, HLD and HTN presenting with increased Rt sided weakness. She did not receive IV t-PA due to risk outweighing benefit.  Stroke: multiple bilateral infarcts - embolic - source unknown.  Resultant  slight worsening of pre-existing spastic right hemiparesis  CT head - No acute intracranial process.  MRI head - Subacute enhancing 4 mm LEFT parietal infarct. Subacute small LEFT parietal white matter infarct. Small bifrontal and RIGHT parietal infarcts in MCA territories.   MRA head - No emergent large vessel occlusion or flow limiting stenosis.   Carotid Doppler - pending  2D Echo - recommended  LDL - 58  HgbA1c - 5.6  VTE prophylaxis -  - Lovenox Diet heart healthy/carb modified Room service appropriate? Yes; Fluid consistency: Thin  aspirin 325 mg daily prior to admission, now on aspirin 325 mg daily  Patient counseled to be compliant with her antithrombotic medications  Ongoing aggressive stroke risk factor management  Therapy recommendations:  SNF  Disposition:  Pending  Hypertension  Stable  Permissive hypertension (OK if < 220/120) but gradually  normalize in 5-7 days  Long-term BP goal normotensive  Hyperlipidemia  Home meds:  Lipitor 40 mg dailyresumed in hospital  LDL 58, goal < 70  Continue statin at discharge   Other Stroke Risk Factors  Advanced age  ETOH use, advised to drink no more than 1 drink per day.  Hx stroke/TIA   Other Active Problems  Mild anemia  Hospital day # 1  I have personally examined this patient, reviewed notes, independently viewed imaging studies, participated in medical decision making and plan of care.ROS completed by me personally and pertinent positives fully documented  I have made any additions or clarifications directly to the above note. She has presented with tiny left parietal subacute infarcts etiology probably embolic patient is not a good long-term anticoagulation candidate  given her Prevacid and spastic hemiplegia, advanced age and fall risk situation. Hence does not recommend doing TEE and loop recorder. Change aspirin to Plavix for stroke prevention. Discussed with patient, husband, daughter and Dr.Patel and answered questions. Greater than 50% time during this 35 minute visit was spent on counseling and coordination of care about her strokes, fall risk prevention discussion and answering questions. Delia HeadyPramod Sethi, MD Medical Director [facility] Pager: [phone] 04/04/2017 2:23 PM   To contact Stroke Continuity provider, please refer to [email or web address]. After hours, contact General Neurology

## 2017-04-04 NOTE — Evaluation (Signed)
Occupational Therapy Evaluation Patient Details Name: OLUWASEUN BRUYERE MRN: [id number] DOB: July 29, 1927 Today's Date: 04/04/2017    History of Present Illness Pt is an 83 yo female admitted through the ED on 03/03/17 with worsening right sided weakness. Pt has a history of CVA in 07/2015 resulting in right sided weakness with dysphagia and dysphonia. PMH significant for R sided CVA, HLD, HTN, OP, Trigeminal neuralgia.    Clinical Impression   PTA, pt was living with her husband and was performing her ADLs with set up. Pt currently requiring Mod A for LB ADLs, Min A for UB ADLs, and Min A for functional mobility with RW. Pt presenting with decreased standing balance and functional use of RUE. Pt's husband very supportive and both are motivated for pt to return to [facility] and home. Pt would benefit from further acute OT to facilitate safe dc. Feel pt would benefit from dc to CIR for intensive therapy to optimize safety and independence with ADLs and reach Mod I level.     Follow Up Recommendations  CIR;Supervision/Assistance - 24 hour    Equipment Recommendations  Other (comment)(Defer to next venue)    Recommendations for Other Services PT consult;Rehab consult     Precautions / Restrictions Precautions Precautions: Fall Restrictions Weight Bearing Restrictions: No      Mobility Bed Mobility Overal bed mobility: Needs Assistance Bed Mobility: Sit to Supine       Sit to supine: Min assist   General bed mobility comments: Min A to reposition legs in bed. Pt able to transition from sitting to supien with increased time  Transfers Overall transfer level: Needs assistance Equipment used: Rolling walker (2 wheeled) Transfers: Sit to/from Stand Sit to Stand: Min assist         General transfer comment: Min A to power into standing. requiring increased time to gain stability and posture. Pt with tendency for posterior lean during transitions.     Balance Overall balance  assessment: Needs assistance Sitting-balance support: No upper extremity supported;Feet supported Sitting balance-Leahy Scale: Fair Sitting balance - Comments: Able to doff shoes at EOB without LOB   Standing balance support: Bilateral upper extremity supported Standing balance-Leahy Scale: Poor Standing balance comment: reliant on RW for stability in standing                           ADL either performed or assessed with clinical judgement   ADL Overall ADL's : Needs assistance/impaired Eating/Feeding: Minimal assistance;Sitting   Grooming: Maximal assistance;Standing;Cueing for sequencing;Oral care Grooming Details (indicate cue type and reason): Pt requiring physical support to stabilize while performing bilateral tasks at sink. Pt with posterior lean during dynamic movements. Pt demonstrating poor funcitonal use of RUE/hand. Pt attempting to use R hand for holding tooth bursh and tooth paste; required hand over hand A to maintain grasp in R hand.  Upper Body Bathing: Minimal assistance;Sitting   Lower Body Bathing: Maximal assistance;Sit to/from stand   Upper Body Dressing : Minimal assistance;Sitting   Lower Body Dressing: Maximal assistance;Sit to/from stand Lower Body Dressing Details (indicate cue type and reason): Pt able to doff shoes at EOB by brining ankel to knee. Would require Max A for standing balance during dynamic movement due to posterior lean Toilet Transfer: Minimal assistance;Ambulation;RW           Functional mobility during ADLs: Minimal assistance;Rolling walker General ADL Comments: Pt demonstrating decreased fucntional performance due to decreased balance and functional use of RUE.  Pt highly motivated and husband is very supprotive     Financial controllerVision         Perception     Praxis      Pertinent Vitals/Pain Pain Assessment: No/denies pain     Hand Dominance Right   Extremity/Trunk Assessment Upper Extremity Assessment Upper Extremity  Assessment: RUE deficits/detail RUE Deficits / Details: RUE with tendency to flex and turn inward forming an indwelled fist at her chest. Pt able to open her hand and extend elbow. Decreased FM skills and strength; pt and husband reporting RUE weaker and less fucntional than before admission RUE Coordination: decreased fine motor;decreased gross motor   Lower Extremity Assessment Lower Extremity Assessment: Defer to PT evaluation RLE Deficits / Details: weakness noted in RLE at least 3/5 grossly LLE Deficits / Details: weakness noted in LLE. At least 3+/5 grossly   Cervical / Trunk Assessment Cervical / Trunk Assessment: Kyphotic   Communication Communication Communication: No difficulties   Cognition Arousal/Alertness: Awake/alert Behavior During Therapy: WFL for tasks assessed/performed Overall Cognitive Status: Within Functional Limits for tasks assessed                                     General Comments  Pt husband present throughout session    Exercises Exercises: General Upper Extremity General Exercises - Upper Extremity Elbow Extension: PROM;10 reps;Right;Seated Wrist Flexion: PROM;Right;10 reps;Seated Wrist Extension: PROM;Right;10 reps;Seated   Shoulder Instructions      Home Living Family/patient expects to be discharged to:: Private residence Living Arrangements: Spouse/significant other Available Help at Discharge: Family;Available 24 hours/day Type of Home: House Home Access: Stairs to enter Entergy CorporationEntrance Stairs-Number of Steps: 2   Home Layout: Two level;Able to live on main level with bedroom/bathroom     Bathroom Shower/Tub: Tub/shower unit;Curtain   Bathroom Toilet: Standard     Home Equipment: Environmental consultantWalker - 2 wheels;Bedside commode;Tub bench;Walker - 4 wheels;Wheelchair - manual;Grab bars - toilet;Grab bars - tub/shower;Hand held shower head;Transport chair          Prior Functioning/Environment Level of Independence: Needs assistance   Gait / Transfers Assistance Needed: uses RW for gait throughout the home ADL's / Homemaking Assistance Needed: Husband assists with ADLS and IADLS. Pt reports he sets out her clothes and she is able to manage to put them on herself.    Comments: husband assists with all activities except for a cleaning person who comes every other week.         OT Problem List: Decreased strength;Decreased range of motion;Decreased activity tolerance;Impaired balance (sitting and/or standing);Decreased safety awareness;Decreased knowledge of use of DME or AE;Impaired UE functional use      OT Treatment/Interventions: Self-care/ADL training;Therapeutic exercise;Energy conservation;DME and/or AE instruction;Therapeutic activities;Patient/family education    OT Goals(Current goals can be found in the care plan section) Acute Rehab OT Goals Patient Stated Goal: Get stronger OT Goal Formulation: With patient Time For Goal Achievement: 04/18/17 Potential to Achieve Goals: Good ADL Goals Pt Will Perform Grooming: with min guard assist;standing Pt Will Perform Upper Body Dressing: with set-up;with supervision;sitting Pt Will Perform Lower Body Dressing: with min assist;sit to/from stand Pt Will Transfer to Toilet: with set-up;with supervision;ambulating;bedside commode Pt Will Perform Toileting - Clothing Manipulation and hygiene: with min guard assist;sit to/from stand  OT Frequency: Min 2X/week   Barriers to D/C:            Co-evaluation  AM-PAC PT "6 Clicks" Daily Activity     Outcome Measure Help from another person eating meals?: A Little Help from another person taking care of personal grooming?: A Little Help from another person toileting, which includes using toliet, bedpan, or urinal?: A Little Help from another person bathing (including washing, rinsing, drying)?: A Lot Help from another person to put on and taking off regular upper body clothing?: A Little Help from  another person to put on and taking off regular lower body clothing?: A Lot 6 Click Score: 16   End of Session Equipment Utilized During Treatment: Gait belt;Rolling walker Nurse Communication: Mobility status  Activity Tolerance: Patient tolerated treatment well Patient left: in bed;with call bell/phone within reach;with family/visitor present  OT Visit Diagnosis: Unsteadiness on feet (R26.81);Other abnormalities of gait and mobility (R26.89);Muscle weakness (generalized) (M62.81);Hemiplegia and hemiparesis Hemiplegia - Right/Left: Right Hemiplegia - dominant/non-dominant: Dominant Hemiplegia - caused by: Cerebral infarction                Time: 1343-1417 OT Time Calculation (min): 34 min Charges:  OT General Charges $OT Visit: 1 Visit OT Evaluation $OT Eval Moderate Complexity: 1 Mod OT Treatments $Self Care/Home Management : 8-22 mins G-Codes:     Carle Dargan MSOT, OTR/L Acute Rehab Pager: [phone] Office: [phone]  Theodoro GristCharis M Scott Vanderveer 04/04/2017, 3:17 PM

## 2017-04-04 NOTE — Progress Notes (Signed)
[facility] Progress Note  Patient: Kelly HumphreysBennie H Chauca ZOX:[id number]   PCP: Gaspar Garbeisovec, Richard W, MD DOB: [birth year]   DOA: 04/03/2017   DOS: 04/04/2017   Date of Service: the patient was seen and examined on 04/04/2017  Subjective: Feeling better, the right-sided weakness is somewhat better although not back to baseline.  No nausea no vomiting no fever no chills no chest pain abdominal pain.   Brief hospital course: Pt. with PMH of CVA with residual right-sided weakness, chronic diastolic CHF, HTN, prediabetes, chronic anemia; admitted on 04/03/2017, presented with complaint of right-sided weakness worsening, was found to have subacute CVA. Currently further plan is for the stroke workup and rehab arrangement.  Assessment and Plan: 1.  Subacute CVA. Multiple bilateral infarcts, most likely embolic. Carotid Doppler and echocardiogram pending PT recommends SNF OT recommends CIR. Speech therapy consult pending. Passed swallowing eval. Continue cardiac diet. Change aspirin to Plavix. Patient is at high fall risk with advanced age and therefore anticoagulation would not be recommended ideally.  Therefore TEE and loop recorder is also not recommended right now. Continue Lipitor 40 mg.  2.  Dyslipidemia. Continue statin.  3.  Depression. Continuing Prozac.  4.  Overactive bladder. Continue Ditropan.  Diet: Cardiac diet DVT Prophylaxis: subcutaneous Heparin  Advance goals of care discussion: full code  Family Communication: family was present at bedside, at the time of interview. The pt provided permission to discuss medical plan with the family. Opportunity was given to ask question and all questions were answered satisfactorily.   Disposition:  Discharge to be determined.   Consultants: neurolgy Procedures: Echocardiogram   Antibiotics: Anti-infectives (From admission, onward)   None       Objective: Physical Exam: Vitals:   04/04/17 0140 04/04/17 0340 04/04/17 0540  04/04/17 1040  BP: (!) 163/76 140/71 (!) 121/59 138/64  Pulse: 79 70 63 70  Resp: 20 18 16 18   Temp: 98.1 F (36.7 C) 98.2 F (36.8 C) 98 F (36.7 C) 98.9 F (37.2 C)  TempSrc: Oral Oral Oral Oral  SpO2: 98% 100% 98% 98%  Weight: 44.2 kg (97 lb 8 oz)     Height: 5' (1.524 m)       Intake/Output Summary (Last 24 hours) at 04/04/2017 1543 Last data filed at 04/04/2017 1454 Gross per 24 hour  Intake -  Output 750 ml  Net -750 ml   Filed Weights   04/03/17 2258 04/04/17 0140  Weight: 45.2 kg (99 lb 11.2 oz) 44.2 kg (97 lb 8 oz)   General: Alert, Awake and Oriented to Time, Place and Person. Appear in mild distress, affect appropriate Eyes: PERRL, Conjunctiva normal ENT: Oral Mucosa clear moist. Neck: no JVD, no Abnormal Mass Or lumps Cardiovascular: S1 and S2 Present, no Murmur, Peripheral Pulses Present Respiratory: normal respiratory effort, Bilateral Air entry equal and Decreased, no use of accessory muscle, Clear to Auscultation, no Crackles, no wheezes Abdomen: Bowel Sound present, Soft and no tenderness, no hernia Skin: no redness, no Rash, no induration Extremities: on Pedal edema, no calf tenderness Neurologic: Right-sided weakness, facial droop Data Reviewed: CBC: Recent Labs  Lab 04/03/17 2214 04/03/17 2223  WBC 9.4  --   NEUTROABS 6.4  --   HGB 11.8* 11.6*  HCT 36.3 34.0*  MCV 90.5  --   PLT 164  --    Basic Metabolic Panel: Recent Labs  Lab 04/03/17 2214 04/03/17 2223  NA 139 143  K 3.6 3.6  CL 111 108  CO2 23  --  GLUCOSE 109* 111*  BUN 23* 23*  CREATININE 0.77 0.70  CALCIUM 8.7*  --     Liver Function Tests: Recent Labs  Lab 04/03/17 2214  AST 20  ALT 20  ALKPHOS 74  BILITOT 0.3  PROT 6.3*  ALBUMIN 3.3*   No results for input(s): LIPASE, AMYLASE in the last 168 hours. No results for input(s): AMMONIA in the last 168 hours. Coagulation Profile: Recent Labs  Lab 04/03/17 2225  INR 1.08   Cardiac Enzymes: No results for  input(s): CKTOTAL, CKMB, CKMBINDEX, TROPONINI in the last 168 hours. BNP (last 3 results) No results for input(s): PROBNP in the last 8760 hours. CBG: Recent Labs  Lab 04/04/17 0632 04/04/17 1132  GLUCAP 82 104*   Studies: Dg Chest 2 View  Result Date: 04/03/2017 CLINICAL DATA:  Code stroke, crackles on lung exam EXAM: CHEST  2 VIEW COMPARISON:  02/01/2016 FINDINGS: Low lung volumes. No focal pulmonary infiltrate, consolidation or effusion. Cardiomediastinal silhouette within normal limits. Aortic atherosclerosis. No pneumothorax. Calcified breast implants. Stable mild wedge deformities of mid to upper thoracic vertebra. IMPRESSION: No active cardiopulmonary disease. Electronically Signed   By: Jasmine PangKim  Fujinaga M.D.   On: 04/03/2017 23:46   Mr Angiogram Head Wo Contrast  Result Date: 04/04/2017 CLINICAL DATA:  Increasing RIGHT-sided weakness. Suspect stroke. History of stroke, trigeminal neuralgia, tremor, hypertension, hyperlipidemia. EXAM: MRI HEAD WITHOUT AND WITH CONTRAST MRA HEAD WITHOUT CONTRAST TECHNIQUE: Multiplanar, multiecho pulse sequences of the brain and surrounding structures were obtained without and with intravenous contrast. Angiographic images of the head were obtained using MRA technique without contrast. CONTRAST:  9 cc MultiHance COMPARISON:  CT HEAD April 03, 2017 at 2218 hours and MRI of the head February 01, 2016 FINDINGS: MRI HEAD FINDINGS INTRACRANIAL CONTENTS: 4 mm reduced diffusion LEFT parietal cortex at convexity, too small to characterize on ADC maps, though there is faint enhancement. Reduced diffusion in the LEFT parietal white matter with normalized ADC values. Chronic RIGHT parietal microhemorrhage. Small areas bifrontal and RIGHT parietal encephalomalacia. Confluent supratentorial white matter FLAIR T2 hyperintensities. Patchy pontine white matter FLAIR T2 hyperintensities. Early LEFT cerebral peduncle wallerian degeneration. Old LEFT posterior limb of the  internal capsule lacunar infarct. Old RIGHT basal ganglia lacunar infarct. Prominent basal ganglia and thalamus perivascular spaces associated with chronic small vessel ischemic disease. No midline shift, mass effect or masses. Moderate to severe parenchymal brain volume loss. No hydrocephalus. No abnormal parenchymal or extra-axial enhancement. No extra-axial fluid collections or masses. VASCULAR: See below. SKULL AND UPPER CERVICAL SPINE: No abnormal sellar expansion. No suspicious calvarial bone marrow signal. Craniocervical junction maintained. SINUSES/ORBITS: The mastoid air-cells and included paranasal sinuses are well-aerated.The included ocular globes and orbital contents are non-suspicious. Status post bilateral ocular lens implants. OTHER: None. MRA HEAD FINDINGS ANTERIOR CIRCULATION: Normal flow related enhancement of the included cervical, petrous, cavernous and supraclinoid internal carotid arteries. RIGHT posterior communicating artery origin infundibulum. Patent anterior communicating artery. Patent anterior and middle cerebral arteries, including distal segments. No large vessel occlusion, flow limiting stenosis, aneurysm. POSTERIOR CIRCULATION: Codominant vertebral artery's. Basilar artery is patent, with normal flow related enhancement of the main branch vessels. Fenestrated proximal basilar artery. Patent posterior cerebral arteries. Mild stenosis bilateral P2 segments compatible with atherosclerosis. No large vessel occlusion, flow limiting stenosis,  aneurysm. ANATOMIC VARIANTS: None. Source images and MIP images were reviewed. IMPRESSION: MRI HEAD: 1. Subacute enhancing 4 mm LEFT parietal infarct. Subacute small LEFT parietal white matter infarct. 2. Small bifrontal and RIGHT parietal infarcts in  MCA territories. 3. Severe chronic small vessel ischemic disease. Old lacunar infarcts. MRA HEAD: 1. No emergent large vessel occlusion or flow limiting stenosis. Electronically Signed   By: Awilda Metro M.D.   On: 04/04/2017 01:37   Mr Laqueta Jean And Wo Contrast  Result Date: 04/04/2017 CLINICAL DATA:  Increasing RIGHT-sided weakness. Suspect stroke. History of stroke, trigeminal neuralgia, tremor, hypertension, hyperlipidemia. EXAM: MRI HEAD WITHOUT AND WITH CONTRAST MRA HEAD WITHOUT CONTRAST TECHNIQUE: Multiplanar, multiecho pulse sequences of the brain and surrounding structures were obtained without and with intravenous contrast. Angiographic images of the head were obtained using MRA technique without contrast. CONTRAST:  9 cc MultiHance COMPARISON:  CT HEAD April 03, 2017 at 2218 hours and MRI of the head February 01, 2016 FINDINGS: MRI HEAD FINDINGS INTRACRANIAL CONTENTS: 4 mm reduced diffusion LEFT parietal cortex at convexity, too small to characterize on ADC maps, though there is faint enhancement. Reduced diffusion in the LEFT parietal white matter with normalized ADC values. Chronic RIGHT parietal microhemorrhage. Small areas bifrontal and RIGHT parietal encephalomalacia. Confluent supratentorial white matter FLAIR T2 hyperintensities. Patchy pontine white matter FLAIR T2 hyperintensities. Early LEFT cerebral peduncle wallerian degeneration. Old LEFT posterior limb of the internal capsule lacunar infarct. Old RIGHT basal ganglia lacunar infarct. Prominent basal ganglia and thalamus perivascular spaces associated with chronic small vessel ischemic disease. No midline shift, mass effect or masses. Moderate to severe parenchymal brain volume loss. No hydrocephalus. No abnormal parenchymal or extra-axial enhancement. No extra-axial fluid collections or masses. VASCULAR: See below. SKULL AND UPPER CERVICAL SPINE: No abnormal sellar expansion. No suspicious calvarial bone marrow signal. Craniocervical junction maintained. SINUSES/ORBITS: The mastoid air-cells and included paranasal sinuses are well-aerated.The included ocular globes and orbital contents are non-suspicious. Status post bilateral  ocular lens implants. OTHER: None. MRA HEAD FINDINGS ANTERIOR CIRCULATION: Normal flow related enhancement of the included cervical, petrous, cavernous and supraclinoid internal carotid arteries. RIGHT posterior communicating artery origin infundibulum. Patent anterior communicating artery. Patent anterior and middle cerebral arteries, including distal segments. No large vessel occlusion, flow limiting stenosis, aneurysm. POSTERIOR CIRCULATION: Codominant vertebral artery's. Basilar artery is patent, with normal flow related enhancement of the main branch vessels. Fenestrated proximal basilar artery. Patent posterior cerebral arteries. Mild stenosis bilateral P2 segments compatible with atherosclerosis. No large vessel occlusion, flow limiting stenosis,  aneurysm. ANATOMIC VARIANTS: None. Source images and MIP images were reviewed. IMPRESSION: MRI HEAD: 1. Subacute enhancing 4 mm LEFT parietal infarct. Subacute small LEFT parietal white matter infarct. 2. Small bifrontal and RIGHT parietal infarcts in MCA territories. 3. Severe chronic small vessel ischemic disease. Old lacunar infarcts. MRA HEAD: 1. No emergent large vessel occlusion or flow limiting stenosis. Electronically Signed   By: Awilda Metro M.D.   On: 04/04/2017 01:37   Ct Head Code Stroke Wo Contrast  Addendum Date: 04/04/2017   ADDENDUM REPORT: 04/04/2017 01:06 ADDENDUM: Critical Value/emergent results were called by telephone at the time of interpretation on 04/04/2017 at 1:05 am to Dr. Otelia Limes, Neurology, who verbally acknowledged these results. Electronically Signed   By: Awilda Metro M.D.   On: 04/04/2017 01:06   Result Date: 04/04/2017 CLINICAL DATA:  Code stroke.  RIGHT-sided weakness. EXAM: CT HEAD WITHOUT CONTRAST TECHNIQUE: Contiguous axial images were obtained from the base of the skull through the vertex without intravenous contrast. COMPARISON:  CT HEAD February 01, 2016 and MRI of the head February 01, 2016 FINDINGS:  BRAIN: No intraparenchymal hemorrhage, mass effect nor midline shift. Confluent similar supratentorial and pontine white matter  hypodensities. No acute large vascular territory infarcts. Small areas RIGHT frontoparietal encephalomalacia. Old LEFT posterior limb of in the internal capsule small infarct. Faint hypodensities bilateral basal ganglia and thalamus a responding to perivascular spaces associated with chronic small vessel ischemic disease. No abnormal extra-axial fluid collections. Basal cisterns are patent. VASCULAR: Moderate calcific atherosclerosis of the carotid siphons. SKULL: No skull fracture. No significant scalp soft tissue swelling. SINUSES/ORBITS: The mastoid air-cells and included paranasal sinuses are well-aerated.The included ocular globes and orbital contents are non-suspicious. OTHER: None. ASPECTS [facility] Stroke Program Early CT Score) - Ganglionic level infarction (caudate, lentiform nuclei, internal capsule, insula, M1-M3 cortex): 7 - Supraganglionic infarction (M4-M6 cortex): 3 Total score (0-10 with 10 being normal): 10 IMPRESSION: 1. No acute intracranial process. 2. Moderate to severe chronic small vessel ischemic disease and old lacunar infarcts. 3. Small areas RIGHT frontoparietal encephalomalacia most compatible old RIGHT MCA territory infarcts. 4. ASPECTS is 10. 5. Dr. Otelia Limes, Neurology paged via [facility] secure hospital paging system on 04/03/2017 at 10:28 pm, awaiting return call. Electronically Signed: By: Awilda Metro M.D. On: 04/03/2017 22:32    Scheduled Meds: . atorvastatin  40 mg Oral q1800  . clopidogrel  75 mg Oral Daily  . enoxaparin (LOVENOX) injection  30 mg Subcutaneous Daily  . FLUoxetine  20 mg Oral Daily  . insulin aspart  0-15 Units Subcutaneous TID WC  . oxybutynin  5 mg Oral QHS  . topiramate  50 mg Oral BID   Continuous Infusions: . sodium chloride 50 mL/hr at 04/04/17 1348   PRN Meds: acetaminophen **OR** acetaminophen (TYLENOL) oral liquid  160 mg/5 mL **OR** acetaminophen, hydrALAZINE, senna-docusate  Time spent: 35 minutes  Author: Lynden Oxford, MD [facility] Pager: [phone] 04/04/2017 3:43 PM  If 7PM-7AM, please contact night-coverage at [email or web address], password [facility]

## 2017-04-04 NOTE — Progress Notes (Signed)
Pt experienced urinary retention and In and Out catheterization yielded urine @ 0400. Urine sample sent to lab. Pt has no c/o pain or discomfort. Will continue to monitor.

## 2017-04-04 NOTE — Plan of Care (Signed)
Pt progressing

## 2017-04-05 ENCOUNTER — Inpatient Hospital Stay (HOSPITAL_COMMUNITY): Payer: Medicare Other

## 2017-04-05 ENCOUNTER — Inpatient Hospital Stay (HOSPITAL_COMMUNITY)
Admission: RE | Admit: 2017-04-05 | Discharge: 2017-04-13 | DRG: 057 | Disposition: A | Discharge status: Home Health | Payer: Medicare Other | Source: Intra-hospital | Attending: Physical Medicine & Rehabilitation | Admitting: Physical Medicine & Rehabilitation

## 2017-04-05 ENCOUNTER — Other Ambulatory Visit: Payer: Self-pay

## 2017-04-05 ENCOUNTER — Encounter (HOSPITAL_COMMUNITY): Payer: Self-pay

## 2017-04-05 DIAGNOSIS — R7303 Prediabetes: Secondary | ICD-10-CM | POA: Diagnosis present

## 2017-04-05 DIAGNOSIS — Z79899 Other long term (current) drug therapy: Secondary | ICD-10-CM | POA: Diagnosis not present

## 2017-04-05 DIAGNOSIS — I5032 Chronic diastolic (congestive) heart failure: Secondary | ICD-10-CM | POA: Diagnosis present

## 2017-04-05 DIAGNOSIS — K5901 Slow transit constipation: Secondary | ICD-10-CM | POA: Diagnosis present

## 2017-04-05 DIAGNOSIS — D638 Anemia in other chronic diseases classified elsewhere: Secondary | ICD-10-CM | POA: Diagnosis present

## 2017-04-05 DIAGNOSIS — S32040S Wedge compression fracture of fourth lumbar vertebra, sequela: Secondary | ICD-10-CM | POA: Diagnosis not present

## 2017-04-05 DIAGNOSIS — N390 Urinary tract infection, site not specified: Secondary | ICD-10-CM | POA: Diagnosis not present

## 2017-04-05 DIAGNOSIS — R0989 Other specified symptoms and signs involving the circulatory and respiratory systems: Secondary | ICD-10-CM

## 2017-04-05 DIAGNOSIS — E8809 Other disorders of plasma-protein metabolism, not elsewhere classified: Secondary | ICD-10-CM | POA: Diagnosis present

## 2017-04-05 DIAGNOSIS — T1490XA Injury, unspecified, initial encounter: Secondary | ICD-10-CM

## 2017-04-05 DIAGNOSIS — I6300 Cerebral infarction due to thrombosis of unspecified precerebral artery: Secondary | ICD-10-CM

## 2017-04-05 DIAGNOSIS — E46 Unspecified protein-calorie malnutrition: Secondary | ICD-10-CM

## 2017-04-05 DIAGNOSIS — E785 Hyperlipidemia, unspecified: Secondary | ICD-10-CM | POA: Diagnosis present

## 2017-04-05 DIAGNOSIS — D62 Acute posthemorrhagic anemia: Secondary | ICD-10-CM

## 2017-04-05 DIAGNOSIS — S32040A Wedge compression fracture of fourth lumbar vertebra, initial encounter for closed fracture: Secondary | ICD-10-CM

## 2017-04-05 DIAGNOSIS — F329 Major depressive disorder, single episode, unspecified: Secondary | ICD-10-CM | POA: Diagnosis present

## 2017-04-05 DIAGNOSIS — I110 Hypertensive heart disease with heart failure: Secondary | ICD-10-CM | POA: Diagnosis present

## 2017-04-05 DIAGNOSIS — N319 Neuromuscular dysfunction of bladder, unspecified: Secondary | ICD-10-CM | POA: Diagnosis present

## 2017-04-05 DIAGNOSIS — R8290 Unspecified abnormal findings in urine: Secondary | ICD-10-CM | POA: Diagnosis not present

## 2017-04-05 DIAGNOSIS — M4856XD Collapsed vertebra, not elsewhere classified, lumbar region, subsequent encounter for fracture with routine healing: Secondary | ICD-10-CM | POA: Diagnosis present

## 2017-04-05 DIAGNOSIS — I69392 Facial weakness following cerebral infarction: Secondary | ICD-10-CM | POA: Diagnosis not present

## 2017-04-05 DIAGNOSIS — I69351 Hemiplegia and hemiparesis following cerebral infarction affecting right dominant side: Secondary | ICD-10-CM | POA: Diagnosis not present

## 2017-04-05 DIAGNOSIS — N3281 Overactive bladder: Secondary | ICD-10-CM | POA: Diagnosis present

## 2017-04-05 DIAGNOSIS — I10 Essential (primary) hypertension: Secondary | ICD-10-CM

## 2017-04-05 DIAGNOSIS — Z8262 Family history of osteoporosis: Secondary | ICD-10-CM

## 2017-04-05 DIAGNOSIS — Z8679 Personal history of other diseases of the circulatory system: Secondary | ICD-10-CM | POA: Diagnosis not present

## 2017-04-05 DIAGNOSIS — Z90710 Acquired absence of both cervix and uterus: Secondary | ICD-10-CM

## 2017-04-05 DIAGNOSIS — G500 Trigeminal neuralgia: Secondary | ICD-10-CM | POA: Diagnosis present

## 2017-04-05 DIAGNOSIS — I351 Nonrheumatic aortic (valve) insufficiency: Secondary | ICD-10-CM

## 2017-04-05 DIAGNOSIS — G8111 Spastic hemiplegia affecting right dominant side: Secondary | ICD-10-CM

## 2017-04-05 DIAGNOSIS — Z7982 Long term (current) use of aspirin: Secondary | ICD-10-CM | POA: Diagnosis not present

## 2017-04-05 DIAGNOSIS — M545 Low back pain: Secondary | ICD-10-CM | POA: Diagnosis not present

## 2017-04-05 DIAGNOSIS — I6389 Other cerebral infarction: Secondary | ICD-10-CM | POA: Diagnosis present

## 2017-04-05 DIAGNOSIS — M810 Age-related osteoporosis without current pathological fracture: Secondary | ICD-10-CM | POA: Diagnosis present

## 2017-04-05 LAB — VAS US CAROTID
LEFT VERTEBRAL DIAS: -19 cm/s
Left CCA dist dias: -11 cm/s
Left CCA dist sys: -80 cm/s
Left CCA prox dias: -13 cm/s
Left CCA prox sys: -61 cm/s
Left ICA dist dias: -14 cm/s
Left ICA dist sys: -61 cm/s
RIGHT VERTEBRAL DIAS: 16 cm/s
Right CCA prox dias: 15 cm/s
Right CCA prox sys: 83 cm/s
Right cca dist sys: -80 cm/s

## 2017-04-05 LAB — CBC
HCT: 33.8 % — ABNORMAL LOW (ref 36.0–46.0)
Hemoglobin: 11 g/dL — ABNORMAL LOW (ref 12.0–15.0)
MCH: 29.2 pg (ref 26.0–34.0)
MCHC: 32.5 g/dL (ref 30.0–36.0)
MCV: 89.7 fL (ref 78.0–100.0)
Platelets: 191 10*3/uL (ref 150–400)
RBC: 3.77 MIL/uL — ABNORMAL LOW (ref 3.87–5.11)
RDW: 13.6 % (ref 11.5–15.5)
WBC: 7.5 10*3/uL (ref 4.0–10.5)

## 2017-04-05 LAB — GLUCOSE, CAPILLARY
Glucose-Capillary: 83 mg/dL (ref 65–99)
Glucose-Capillary: 96 mg/dL (ref 65–99)
Glucose-Capillary: 99 mg/dL (ref 65–99)
Glucose-Capillary: 100 mg/dL — ABNORMAL HIGH (ref 65–99)

## 2017-04-05 LAB — ECHOCARDIOGRAM COMPLETE
Height: 60 in
Weight: 1560 oz

## 2017-04-05 LAB — CREATININE, SERUM
Creatinine, Ser: 0.68 mg/dL (ref 0.44–1.00)
GFR calc Af Amer: >60 mL/min (ref >60)
GFR calc non Af Amer: >60 mL/min (ref >60)

## 2017-04-05 MED ORDER — OXYBUTYNIN CHLORIDE 5 MG PO TABS
5.0000 mg | ORAL_TABLET | Freq: Every day | ORAL | Status: DC
  Administered 2017-04-05 – 2017-04-07 (×3): 5 mg via ORAL
  Filled 2017-04-05 (×3): qty 1

## 2017-04-05 MED ORDER — CLOPIDOGREL BISULFATE 75 MG PO TABS: 75.0000 mg | ORAL_TABLET | Freq: Every day | ORAL | 0 refills | Status: DC

## 2017-04-05 MED ORDER — ENOXAPARIN SODIUM 30 MG/0.3ML ~~LOC~~ SOLN: 30.0000 mg | SUBCUTANEOUS | Status: DC

## 2017-04-05 MED ORDER — CLOPIDOGREL BISULFATE 75 MG PO TABS
75.0000 mg | ORAL_TABLET | Freq: Every day | ORAL | Status: DC
  Administered 2017-04-06 – 2017-04-13 (×8): 75 mg via ORAL
  Filled 2017-04-05 (×8): qty 1

## 2017-04-05 MED ORDER — ENOXAPARIN SODIUM 30 MG/0.3ML ~~LOC~~ SOLN
30.0000 mg | Freq: Every day | SUBCUTANEOUS | Status: DC
  Administered 2017-04-06 – 2017-04-13 (×8): 30 mg via SUBCUTANEOUS
  Filled 2017-04-05 (×8): qty 0.3

## 2017-04-05 MED ORDER — ACETAMINOPHEN 325 MG PO TABS
650.0000 mg | ORAL_TABLET | ORAL | Status: DC | PRN
Start: 2017-04-05 — End: 2017-04-13

## 2017-04-05 MED ORDER — FLUOXETINE HCL 20 MG PO CAPS
20.0000 mg | ORAL_CAPSULE | Freq: Every day | ORAL | Status: DC
  Administered 2017-04-06 – 2017-04-13 (×8): 20 mg via ORAL
  Filled 2017-04-05 (×8): qty 1

## 2017-04-05 MED ORDER — TOPIRAMATE 25 MG PO TABS
50.0000 mg | ORAL_TABLET | Freq: Two times a day (BID) | ORAL | Status: DC
  Administered 2017-04-05 – 2017-04-13 (×16): 50 mg via ORAL
  Filled 2017-04-05 (×16): qty 2

## 2017-04-05 MED ORDER — INSULIN ASPART 100 UNIT/ML ~~LOC~~ SOLN: 0.0000 [IU] | Freq: Three times a day (TID) | SUBCUTANEOUS | Status: DC

## 2017-04-05 MED ORDER — ONDANSETRON HCL 4 MG/2ML IJ SOLN: 4.0000 mg | Freq: Four times a day (QID) | INTRAMUSCULAR | Status: DC | PRN

## 2017-04-05 MED ORDER — SORBITOL 70 % SOLN
30.0000 mL | Freq: Every day | Status: DC | PRN
  Administered 2017-04-05 – 2017-04-10 (×2): 30 mL via ORAL
  Filled 2017-04-05 (×2): qty 30

## 2017-04-05 MED ORDER — ACETAMINOPHEN 160 MG/5ML PO SOLN: 650.0000 mg | ORAL | Status: DC | PRN

## 2017-04-05 MED ORDER — ONDANSETRON HCL 4 MG PO TABS: 4.0000 mg | ORAL_TABLET | Freq: Four times a day (QID) | ORAL | Status: DC | PRN

## 2017-04-05 MED ORDER — ATORVASTATIN CALCIUM 40 MG PO TABS
40.0000 mg | ORAL_TABLET | Freq: Every day | ORAL | Status: DC
  Administered 2017-04-05 – 2017-04-12 (×8): 40 mg via ORAL
  Filled 2017-04-05 (×8): qty 1

## 2017-04-05 MED ORDER — SENNOSIDES-DOCUSATE SODIUM 8.6-50 MG PO TABS
1.0000 | ORAL_TABLET | Freq: Every evening | ORAL | Status: DC | PRN
  Administered 2017-04-05: 1 via ORAL
  Filled 2017-04-05 (×2): qty 1

## 2017-04-05 MED ORDER — ACETAMINOPHEN 650 MG RE SUPP: 650.0000 mg | RECTAL | Status: DC | PRN

## 2017-04-05 NOTE — Progress Notes (Signed)
  Echocardiogram 2D Echocardiogram has been performed.  Roosvelt MaserLane, Jabaree Mercado F 04/05/2017, 11:40 AM

## 2017-04-05 NOTE — Plan of Care (Signed)
Pt progressing

## 2017-04-05 NOTE — Progress Notes (Signed)
Preliminary results by tech - Carotid Duplex Completed. No evidence of a significant stenosis in bilateral carotid arteries. Vertebral arteries are patent with antegrade flow. Rondi Ivy, BS, RDMS, RVT  

## 2017-04-05 NOTE — Progress Notes (Signed)
Pt voided X 3 overnight.  Pt voided 250-300 mL each occurrence for first two voids. The third occurrence, @ 0445, pt voided 200 mL into bedpan and bladder scan showed residual of over 299 mL. Pt indicated that she did not feel like she had the urge to urinate at the time of the bladder scan and she feels no discomfort. Will continue to monitor.

## 2017-04-05 NOTE — Consult Note (Signed)
Physical Medicine and Rehabilitation Consult Reason for Consult: Right sided weakness with dysphagia Referring Physician: [facility]   HPI: Kelly [name] is a 83 y.o. right handed female with history of hypertension, diastolic congestive heart failure, prediabetes, left MCA with right-sided residual weakness maintained on aspirin and received inpatient rehabilitation services September 2017 and discharged to home at a supervision level ambulating 120 feet. Per chart review and patient, patient lives with spouse. 2 level home with main level bedroom and bathroom and 2 steps to entry. She used a rolling walker for ambulation. Husband assists with some basic ADLs. Presented 04/03/2017 with increasing right sided weakness. Cranial CT reviewed, showing no acute intracranial process. Patient did not receive TPA. MRI revealed subacute enhancing 4 mm left parietal infarct. Subacute small left parietal white matter infarct. Small bifrontal and right parietal infarcts in MCA territories. MRA with no emergent large vessel occlusion or stenosis. Echocardiogram as well as carotid Dopplers are pending. Neurology consulted present maintained on Plavix for CVA prophylaxis. Subcutaneous Lovenox for DVT prophylaxis. Maintained on a regular consistency diet. Physical therapy evaluation completed. M.D. has requested physical medicine rehabilitation consult.   Review of Systems  Constitutional: Negative for chills and fever.  HENT: Negative for hearing loss.   Eyes: Negative for blurred vision and double vision.  Respiratory: Negative for cough.   Cardiovascular: Negative for chest pain and palpitations.  Gastrointestinal: Negative for nausea and vomiting.  Genitourinary: Negative for dysuria and hematuria.  Musculoskeletal: Positive for joint pain and myalgias.  Skin: Negative for rash.  Neurological: Positive for focal weakness and headaches. Negative for seizures.  Psychiatric/Behavioral: Positive for  depression.  All other systems reviewed and are negative.  Past Medical History:  Diagnosis Date  . Acute blood loss anemia   . Adjustment disorder with depressed mood   . CVA (cerebral vascular accident) (HCC) 02/03/2016   Linear infarct within the posterior limb of left internal capsule  . Diastolic CHF (HCC)   . Dysphagia   . Dysphonia 02/20/2013  . Essential and other specified forms of tremor 02/20/2013  . HLD (hyperlipidemia)   . HTN (hypertension)   . OAB (overactive bladder)   . Osteoporosis   . Patient receiving subcutaneous heparin    For DVT prophylaxis 9/17  . Prediabetes   . Psoriasis   . Slow transit constipation   . Stroke (cerebrum) (HCC) 02/01/2016  . Trigeminal neuralgia 02/20/2013   Past Surgical History:  Procedure Laterality Date  . APPENDECTOMY  1940  . bladder tack    . CATARACT EXTRACTION Bilateral   . HEMORRHOID SURGERY  1960  . HUMERUS FRACTURE SURGERY    . LAPAROSCOPIC HYSTERECTOMY  2006  . torn rotator cuff    . WRIST FRACTURE SURGERY  2005   seconday shoulder 2001   Family History  Problem Relation Age of Onset  . Arthritis Brother        spine  . Osteoporosis Maternal Grandmother    Social History:  reports that  has never smoked. she has never used smokeless tobacco. She reports that she drinks alcohol. She reports that she does not use drugs. Allergies: No Known Allergies Medications Prior to Admission  Medication Sig Dispense Refill  . aspirin 325 MG tablet Take 1 tablet (325 mg total) by mouth daily.    Marland Kitchen atorvastatin (LIPITOR) 40 MG tablet Take 1 tablet (40 mg total) by mouth daily at 6 PM.    . FLUoxetine (PROZAC) 20 MG tablet TAKE ONE TABLET  BY MOUTH ONCE DAILY 90 tablet 0  . ibuprofen (ADVIL,MOTRIN) 200 MG tablet Take 200 mg 2 (two) times daily by mouth.    . oxybutynin (DITROPAN) 5 MG tablet Take 5 mg at bedtime by mouth.    . topiramate (TOPAMAX) 50 MG tablet Take 50 mg by mouth 2 (two) times daily.      Home: Home  Living Family/patient expects to be discharged to:: Private residence Living Arrangements: Spouse/significant other Available Help at Discharge: Family, Available 24 hours/day Type of Home: House Home Access: Stairs to enter [employer] of Steps: 2 Home Layout: Two level, Able to live on main level with bedroom/bathroom Bathroom Shower/Tub: Tub/shower unit, Engineer, building services: Standard Home Equipment: Environmental consultant - 2 wheels, Bedside commode, Tub bench, Walker - 4 wheels, Wheelchair - manual, Grab bars - toilet, Grab bars - tub/shower, Hand held shower head, Transport chair  Functional History: Prior Function Level of Independence: Needs assistance Gait / Transfers Assistance Needed: uses RW for gait throughout the home ADL's / Homemaking Assistance Needed: Husband assists with ADLS and IADLS. Pt reports he sets out her clothes and she is able to manage to put them on herself.  Comments: husband assists with all activities except for a cleaning person who comes every other week.  Functional Status:  Mobility: Bed Mobility Overal bed mobility: Needs Assistance Bed Mobility: Sit to Supine Supine to sit: Min assist, HOB elevated Sit to supine: Min assist General bed mobility comments: Min A to reposition legs in bed. Pt able to transition from sitting to supien with increased time Transfers Overall transfer level: Needs assistance Equipment used: Rolling walker (2 wheeled) Transfers: Sit to/from Stand Sit to Stand: Min assist General transfer comment: Min A to power into standing. requiring increased time to gain stability and posture. Pt with tendency for posterior lean during transitions.  Ambulation/Gait Ambulation/Gait assistance: Min assist Ambulation Distance (Feet): 25 Feet Assistive device: Rolling walker (2 wheeled) Gait Pattern/deviations: Step-to pattern, Decreased step length - left, Decreased stance time - right, Decreased stride length, Trunk flexed, Narrow  base of support General Gait Details: slow gait with forward trunk lean due to kyphotic posture. Minimal foot drop noted on RLE which is compensated with increased hip flexion. Cues for positioning within RW.  Gait velocity: decreased Gait velocity interpretation: Below normal speed for age/gender    ADL: ADL Overall ADL's : Needs assistance/impaired Eating/Feeding: Minimal assistance, Sitting Grooming: Maximal assistance, Standing, Cueing for sequencing, Oral care Grooming Details (indicate cue type and reason): Pt requiring physical support to stabilize while performing bilateral tasks at sink. Pt with posterior lean during dynamic movements. Pt demonstrating poor funcitonal use of RUE/hand. Pt attempting to use R hand for holding tooth bursh and tooth paste; required hand over hand A to maintain grasp in R hand.  Upper Body Bathing: Minimal assistance, Sitting Lower Body Bathing: Maximal assistance, Sit to/from stand Upper Body Dressing : Minimal assistance, Sitting Lower Body Dressing: Maximal assistance, Sit to/from stand Lower Body Dressing Details (indicate cue type and reason): Pt able to doff shoes at EOB by brining ankel to knee. Would require Max A for standing balance during dynamic movement due to posterior lean Toilet Transfer: Minimal assistance, Ambulation, RW Functional mobility during ADLs: Minimal assistance, Rolling walker General ADL Comments: Pt demonstrating decreased fucntional performance due to decreased balance and functional use of RUE. Pt highly motivated and husband is very supprotive  Cognition: Cognition Overall Cognitive Status: Within Functional Limits for tasks assessed Orientation Level: Oriented X4  Cognition Arousal/Alertness: Awake/alert Behavior During Therapy: WFL for tasks assessed/performed Overall Cognitive Status: Within Functional Limits for tasks assessed  Blood pressure 134/67, pulse 64, temperature 97.7 F (36.5 C), temperature source  Oral, resp. rate 20, height 5' (1.524 m), weight 44.2 kg (97 lb 8 oz), SpO2 96 %. Physical Exam  Vitals reviewed. Constitutional: She is oriented to person, place, and time. She appears well-developed.  Frail  HENT:  Head: Normocephalic and atraumatic.  Eyes: EOM are normal. Right eye exhibits no discharge. Left eye exhibits no discharge.  Neck: Normal range of motion. Neck supple. No thyromegaly present.  Cardiovascular: Normal rate, regular rhythm and normal heart sounds.  Respiratory: Effort normal and breath sounds normal. No respiratory distress.  GI: Soft. Bowel sounds are normal. She exhibits no distension.  Musculoskeletal: She exhibits no edema or tenderness.  Neurological: She is alert and oriented to person, place, and time.  Makes good eye contact with examiner.  Follows commands.  Right facial weakness Motor: LUE/LLE: 4-/5 proximal to distal RUE: 3+/5 proximal to distal.  Increased tone. RLE: 4-/5 proximal to distal (weaker than left)  Skin: Skin is warm and dry.  Psychiatric: She has a normal mood and affect. Her behavior is normal. Thought content normal.    Results for orders placed or performed during the hospital encounter of 04/03/17 (from the past 24 hour(s))  Glucose, capillary     Status: None   Collection Time: 04/04/17  6:32 AM  Result Value Ref Range   Glucose-Capillary 82 65 - 99 mg/dL   Comment 1 Notify RN    Comment 2 Document in Chart   Glucose, capillary     Status: Abnormal   Collection Time: 04/04/17 11:32 AM  Result Value Ref Range   Glucose-Capillary 104 (H) 65 - 99 mg/dL  Glucose, capillary     Status: Abnormal   Collection Time: 04/04/17  4:59 PM  Result Value Ref Range   Glucose-Capillary 107 (H) 65 - 99 mg/dL  Glucose, capillary     Status: None   Collection Time: 04/04/17  9:35 PM  Result Value Ref Range   Glucose-Capillary 91 65 - 99 mg/dL   Comment 1 Notify RN    Comment 2 Document in Chart    Dg Chest 2 View  Result Date:  04/03/2017 CLINICAL DATA:  Code stroke, crackles on lung exam EXAM: CHEST  2 VIEW COMPARISON:  02/01/2016 FINDINGS: Low lung volumes. No focal pulmonary infiltrate, consolidation or effusion. Cardiomediastinal silhouette within normal limits. Aortic atherosclerosis. No pneumothorax. Calcified breast implants. Stable mild wedge deformities of mid to upper thoracic vertebra. IMPRESSION: No active cardiopulmonary disease. Electronically Signed   By: Jasmine PangKim  Fujinaga M.D.   On: 04/03/2017 23:46   Mr Angiogram Head Wo Contrast  Result Date: 04/04/2017 CLINICAL DATA:  Increasing RIGHT-sided weakness. Suspect stroke. History of stroke, trigeminal neuralgia, tremor, hypertension, hyperlipidemia. EXAM: MRI HEAD WITHOUT AND WITH CONTRAST MRA HEAD WITHOUT CONTRAST TECHNIQUE: Multiplanar, multiecho pulse sequences of the brain and surrounding structures were obtained without and with intravenous contrast. Angiographic images of the head were obtained using MRA technique without contrast. CONTRAST:  9 cc MultiHance COMPARISON:  CT HEAD April 03, 2017 at 2218 hours and MRI of the head February 01, 2016 FINDINGS: MRI HEAD FINDINGS INTRACRANIAL CONTENTS: 4 mm reduced diffusion LEFT parietal cortex at convexity, too small to characterize on ADC maps, though there is faint enhancement. Reduced diffusion in the LEFT parietal white matter with normalized ADC values. Chronic RIGHT parietal microhemorrhage. Small  areas bifrontal and RIGHT parietal encephalomalacia. Confluent supratentorial white matter FLAIR T2 hyperintensities. Patchy pontine white matter FLAIR T2 hyperintensities. Early LEFT cerebral peduncle wallerian degeneration. Old LEFT posterior limb of the internal capsule lacunar infarct. Old RIGHT basal ganglia lacunar infarct. Prominent basal ganglia and thalamus perivascular spaces associated with chronic small vessel ischemic disease. No midline shift, mass effect or masses. Moderate to severe parenchymal brain volume  loss. No hydrocephalus. No abnormal parenchymal or extra-axial enhancement. No extra-axial fluid collections or masses. VASCULAR: See below. SKULL AND UPPER CERVICAL SPINE: No abnormal sellar expansion. No suspicious calvarial bone marrow signal. Craniocervical junction maintained. SINUSES/ORBITS: The mastoid air-cells and included paranasal sinuses are well-aerated.The included ocular globes and orbital contents are non-suspicious. Status post bilateral ocular lens implants. OTHER: None. MRA HEAD FINDINGS ANTERIOR CIRCULATION: Normal flow related enhancement of the included cervical, petrous, cavernous and supraclinoid internal carotid arteries. RIGHT posterior communicating artery origin infundibulum. Patent anterior communicating artery. Patent anterior and middle cerebral arteries, including distal segments. No large vessel occlusion, flow limiting stenosis, aneurysm. POSTERIOR CIRCULATION: Codominant vertebral artery's. Basilar artery is patent, with normal flow related enhancement of the main branch vessels. Fenestrated proximal basilar artery. Patent posterior cerebral arteries. Mild stenosis bilateral P2 segments compatible with atherosclerosis. No large vessel occlusion, flow limiting stenosis,  aneurysm. ANATOMIC VARIANTS: None. Source images and MIP images were reviewed. IMPRESSION: MRI HEAD: 1. Subacute enhancing 4 mm LEFT parietal infarct. Subacute small LEFT parietal white matter infarct. 2. Small bifrontal and RIGHT parietal infarcts in MCA territories. 3. Severe chronic small vessel ischemic disease. Old lacunar infarcts. MRA HEAD: 1. No emergent large vessel occlusion or flow limiting stenosis. Electronically Signed   By: Awilda Metroourtnay  Bloomer M.D.   On: 04/04/2017 01:37   Mr Laqueta JeanBrain W And Wo Contrast  Result Date: 04/04/2017 CLINICAL DATA:  Increasing RIGHT-sided weakness. Suspect stroke. History of stroke, trigeminal neuralgia, tremor, hypertension, hyperlipidemia. EXAM: MRI HEAD WITHOUT AND WITH  CONTRAST MRA HEAD WITHOUT CONTRAST TECHNIQUE: Multiplanar, multiecho pulse sequences of the brain and surrounding structures were obtained without and with intravenous contrast. Angiographic images of the head were obtained using MRA technique without contrast. CONTRAST:  9 cc MultiHance COMPARISON:  CT HEAD April 03, 2017 at 2218 hours and MRI of the head February 01, 2016 FINDINGS: MRI HEAD FINDINGS INTRACRANIAL CONTENTS: 4 mm reduced diffusion LEFT parietal cortex at convexity, too small to characterize on ADC maps, though there is faint enhancement. Reduced diffusion in the LEFT parietal white matter with normalized ADC values. Chronic RIGHT parietal microhemorrhage. Small areas bifrontal and RIGHT parietal encephalomalacia. Confluent supratentorial white matter FLAIR T2 hyperintensities. Patchy pontine white matter FLAIR T2 hyperintensities. Early LEFT cerebral peduncle wallerian degeneration. Old LEFT posterior limb of the internal capsule lacunar infarct. Old RIGHT basal ganglia lacunar infarct. Prominent basal ganglia and thalamus perivascular spaces associated with chronic small vessel ischemic disease. No midline shift, mass effect or masses. Moderate to severe parenchymal brain volume loss. No hydrocephalus. No abnormal parenchymal or extra-axial enhancement. No extra-axial fluid collections or masses. VASCULAR: See below. SKULL AND UPPER CERVICAL SPINE: No abnormal sellar expansion. No suspicious calvarial bone marrow signal. Craniocervical junction maintained. SINUSES/ORBITS: The mastoid air-cells and included paranasal sinuses are well-aerated.The included ocular globes and orbital contents are non-suspicious. Status post bilateral ocular lens implants. OTHER: None. MRA HEAD FINDINGS ANTERIOR CIRCULATION: Normal flow related enhancement of the included cervical, petrous, cavernous and supraclinoid internal carotid arteries. RIGHT posterior communicating artery origin infundibulum. Patent anterior  communicating artery. Patent anterior and middle  cerebral arteries, including distal segments. No large vessel occlusion, flow limiting stenosis, aneurysm. POSTERIOR CIRCULATION: Codominant vertebral artery's. Basilar artery is patent, with normal flow related enhancement of the main branch vessels. Fenestrated proximal basilar artery. Patent posterior cerebral arteries. Mild stenosis bilateral P2 segments compatible with atherosclerosis. No large vessel occlusion, flow limiting stenosis,  aneurysm. ANATOMIC VARIANTS: None. Source images and MIP images were reviewed. IMPRESSION: MRI HEAD: 1. Subacute enhancing 4 mm LEFT parietal infarct. Subacute small LEFT parietal white matter infarct. 2. Small bifrontal and RIGHT parietal infarcts in MCA territories. 3. Severe chronic small vessel ischemic disease. Old lacunar infarcts. MRA HEAD: 1. No emergent large vessel occlusion or flow limiting stenosis. Electronically Signed   By: Awilda Metro M.D.   On: 04/04/2017 01:37   Ct Head Code Stroke Wo Contrast  Addendum Date: 04/04/2017   ADDENDUM REPORT: 04/04/2017 01:06 ADDENDUM: Critical Value/emergent results were called by telephone at the time of interpretation on 04/04/2017 at 1:05 am to Dr. Otelia Limes, Neurology, who verbally acknowledged these results. Electronically Signed   By: Awilda Metro M.D.   On: 04/04/2017 01:06   Result Date: 04/04/2017 CLINICAL DATA:  Code stroke.  RIGHT-sided weakness. EXAM: CT HEAD WITHOUT CONTRAST TECHNIQUE: Contiguous axial images were obtained from the base of the skull through the vertex without intravenous contrast. COMPARISON:  CT HEAD February 01, 2016 and MRI of the head February 01, 2016 FINDINGS: BRAIN: No intraparenchymal hemorrhage, mass effect nor midline shift. Confluent similar supratentorial and pontine white matter hypodensities. No acute large vascular territory infarcts. Small areas RIGHT frontoparietal encephalomalacia. Old LEFT posterior limb of in the  internal capsule small infarct. Faint hypodensities bilateral basal ganglia and thalamus a responding to perivascular spaces associated with chronic small vessel ischemic disease. No abnormal extra-axial fluid collections. Basal cisterns are patent. VASCULAR: Moderate calcific atherosclerosis of the carotid siphons. SKULL: No skull fracture. No significant scalp soft tissue swelling. SINUSES/ORBITS: The mastoid air-cells and included paranasal sinuses are well-aerated.The included ocular globes and orbital contents are non-suspicious. OTHER: None. ASPECTS [facility] Stroke Program Early CT Score) - Ganglionic level infarction (caudate, lentiform nuclei, internal capsule, insula, M1-M3 cortex): 7 - Supraganglionic infarction (M4-M6 cortex): 3 Total score (0-10 with 10 being normal): 10 IMPRESSION: 1. No acute intracranial process. 2. Moderate to severe chronic small vessel ischemic disease and old lacunar infarcts. 3. Small areas RIGHT frontoparietal encephalomalacia most compatible old RIGHT MCA territory infarcts. 4. ASPECTS is 10. 5. Dr. Otelia Limes, Neurology paged via [facility] secure hospital paging system on 04/03/2017 at 10:28 pm, awaiting return call. Electronically Signed: By: Awilda Metro M.D. On: 04/03/2017 22:32    Assessment/Plan: Diagnosis: Left CVA Labs and images independently reviewed.  Records reviewed and summated above. Stroke: Continue secondary stroke prophylaxis and Risk Factor Modification listed below:   Antiplatelet therapy:   Blood Pressure Management:  Continue current medication with prn's with permisive HTN per primary team Statin Agent:   Prediabetes management:   Right sided hemiparesis Motor recovery: Fluoxetine  1. Does the need for close, 24 hr/day medical supervision in concert with the patient's rehab needs make it unreasonable for this patient to be served in a less intensive setting? Yes  2. Co-Morbidities requiring supervision/potential complications: HTN (monitor  and provide prns in accordance with increased physical exertion and pain), diastolic congestive heart failure (monitor for signs/symptoms of fluid overload), prediabetes (Monitor in accordance with exercise and adjust meds as necessary), left MCA with right-sided residual spastic hemiparesis, ABLA (transfuse if necessary to ensure appropriate perfusion  for increased activity tolerance) 3. Due to safety, disease management and patient education, does the patient require 24 hr/day rehab nursing? Yes 4. Does the patient require coordinated care of a physician, rehab nurse, PT (1-2 hrs/day, 5 days/week) and OT (1-2 hrs/day, 5 days/week) to address physical and functional deficits in the context of the above medical diagnosis(es)? Yes Addressing deficits in the following areas: balance, endurance, locomotion, strength, transferring, bathing, dressing, toileting and psychosocial support 5. Can the patient actively participate in an intensive therapy program of at least 3 hrs of therapy per day at least 5 days per week? Yes 6. The potential for patient to make measurable gains while on inpatient rehab is excellent 7. Anticipated functional outcomes upon discharge from inpatient rehab are supervision  with PT, supervision and min assist with OT, n/a with SLP. 8. Estimated rehab length of stay to reach the above functional goals is: 10-14 days. 9. Anticipated D/C setting: Home 10. Anticipated post D/C treatments: HH therapy and Home excercise program 11. Overall Rehab/Functional Prognosis: good  RECOMMENDATIONS: This patient's condition is appropriate for continued rehabilitative care in the following setting: CIR after completion of medical workup. Patient has agreed to participate in recommended program. Yes Note that insurance prior authorization may be required for reimbursement for recommended care.  Comment: Rehab Admissions Coordinator to follow up.  Maryla Morrow, MD, ABPMR Mariam Dollar J.,  PA-C 04/05/2017

## 2017-04-05 NOTE — Care Management Note (Signed)
Case Management Note  Patient Details  Name: Kelly [name] MRN: [id number] Date of Birth: 05/25/1928  Subjective/Objective:                    Action/Plan: Pt discharging to CIR today. No further needs per CM.  Expected Discharge Date:                  Expected Discharge Plan:  IP Rehab Facility  In-House Referral:     Discharge planning Services  CM Consult  Post Acute Care Choice:    Choice offered to:     DME Arranged:    DME Agency:     HH Arranged:    HH Agency:     Status of Service:  Completed, signed off  If discussed at [employer] Length of Stay Meetings, dates discussed:    Additional Comments:  [name], RN 04/05/2017, 12:21 PM

## 2017-04-05 NOTE — Progress Notes (Signed)
Standley Brooking, RN  Rehab Admission Coordinator  Physical Medicine and Rehabilitation  PMR Pre-admission  Signed  Date of Service:  04/05/2017 12:11 PM       Related encounter: ED to Hosp-Admission (Discharged) from 04/03/2017 in [facility]      Signed           [] Hide copied text  [] Hover for details   PMR Admission Coordinator Pre-Admission Assessment  Patient: Kelly [name] is an 83 y.o., female MRN: [id number] DOB: 1928-01-23 Height: 5' (152.4 cm) Weight: 44.2 kg (97 lb 8 oz)                                                                                                                                                  Insurance Information HMO:    PPO:      PCP:   IPA:      80/20: yes     OTHER: no HMO PRIMARY: Medicare a and b      Policy#: [id number]      Subscriber: pt Benefits:  Phone #: passport one online     Name: 04/05/2017 Eff. Date:  11/23/94     Deduct: $1340      Out of Pocket Max: none      Life Max: none CIR: 100%      SNF: 20 full days Outpatient: 80%     Co-Pay: 20% Home Health: 100%      Co-Pay: none DME: 80%     Co-Pay: 20% Providers: pt choice  [employer]      Policy#: [id number]      Subscriber: pt  Medicaid Application Date:       Case Manager:  Disability Application Date:       Case Worker:   Emergency Contact Information        Contact Information    Name Relation Home Work Mobile   Kelly [name],Kelly [name] [phone]  [phone]   [facility] Daughter [phone]       Current Medical History  Patient Admitting Diagnosis: left CVA  History of Present Illness: TDD:UKGURK H Rallsis a 83 y.o.right handed femalewith history ofdocumented hypertension,diastolic congestive heart failure, prediabetes, left MCA with right-sided residual weakness maintained on aspirin and received inpatient rehabilitation services September 2017 .Presented 04/03/2017 with increasing right sided  weakness. Cranial CTreviewed, showingno acute intracranial process. Patient did not receive TPA. MRI revealed subacute enhancing 4 mm left parietal infarct. Subacute small left parietal white matter infarct. Small bifrontal and right parietal infarctsinMCA territories. MRA with no emergent large vessel occlusion or stenosis.Carotid Dopplers in no ICA stenosis. Echocardiogram pending. Neurology consulted present maintained on Plavix for CVA prophylaxis. Subcutaneous Lovenox for DVT prophylaxis. Maintained on a regular consistency diet.   Total: 3  NIHSS  Past Medical History      Past Medical History:  Diagnosis Date  . Acute blood loss anemia   . Adjustment disorder with depressed mood   . CVA (cerebral vascular accident) (HCC) 02/03/2016   Linear infarct within the posterior limb of left internal capsule  . Diastolic CHF (HCC)   . Dysphagia   . Dysphonia 02/20/2013  . Essential and other specified forms of tremor 02/20/2013  . HLD (hyperlipidemia)   . HTN (hypertension)   . OAB (overactive bladder)   . Osteoporosis   . Patient receiving subcutaneous heparin    For DVT prophylaxis 9/17  . Prediabetes   . Psoriasis   . Slow transit constipation   . Stroke (cerebrum) (HCC) 02/01/2016  . Trigeminal neuralgia 02/20/2013    Family History  family history includes Arthritis in her brother; Osteoporosis in her maternal grandmother.  Prior Rehab/Hospitalizations:  Has the patient had major surgery during 100 days prior to admission? No   CIR 02/03/16 until 02/20/17 [facility] Stayed until November 2017 and [facility] and then Out pt rehab until 10/2016.  Current Medications   Current Facility-Administered Medications:  .  acetaminophen (TYLENOL) tablet 650 mg, 650 mg, Oral, Q4H PRN **OR** acetaminophen (TYLENOL) solution 650 mg, 650 mg, Per Tube, Q4H PRN **OR** acetaminophen (TYLENOL) suppository 650 mg, 650 mg, Rectal, Q4H PRN,  Kelly [name], Kelly M, MD .  atorvastatin (LIPITOR) tablet 40 mg, 40 mg, Oral, q1800, Kelly [name], Kelly M, MD, 40 mg at 04/04/17 1742 .  clopidogrel (PLAVIX) tablet 75 mg, 75 mg, Oral, Daily, Kelly [name], Kelly M, MD, 75 mg at 04/05/17 1205 .  enoxaparin (LOVENOX) injection 30 mg, 30 mg, Subcutaneous, Daily, Kelly [name], Kelly M, MD, 30 mg at 04/05/17 1204 .  FLUoxetine (PROZAC) capsule 20 mg, 20 mg, Oral, Daily, Kelly [name], Kelly M, MD, 20 mg at 04/05/17 1206 .  hydrALAZINE (APRESOLINE) injection 10 mg, 10 mg, Intravenous, Q8H PRN, Kelly [name], Kelly M, MD .  insulin aspart (novoLOG) injection 0-15 Units, 0-15 Units, Subcutaneous, TID WC, Kelly [name], Kelly M, MD .  oxybutynin [facility](DITROPAN) tablet 5 mg, 5 mg, Oral, QHS, Kelly [name], Kelly M, MD, 5 mg at 04/04/17 2209 .  senna-docusate (Senokot-S) tablet 1 tablet, 1 tablet, Oral, QHS PRN, Kelly [name], Kelly M, MD .  topiramate (TOPAMAX) tablet 50 mg, 50 mg, Oral, BID, Kelly [name], Kelly M, MD, 50 mg at 04/05/17 1205  Patients Current Diet: Diet heart healthy/carb modified Room service appropriate? Yes; Fluid consistency: Thin  Precautions / Restrictions Precautions Precautions: Fall Precaution Comments: R sided weakness Restrictions Weight Bearing Restrictions: No   Has the patient had 2 or more falls or a fall with injury in the past year?No  Fell four weeks ago  Prior Activity Level Limited Community (1-2x/wk): Mod I with RW; supervision for adls by [name]  Journalist, newspaperHome Assistive Devices / Equipment Home Assistive Devices/Equipment: Environmental consultantWalker (specify type) Home Equipment: Dan HumphreysWalker - 2 wheels, Bedside commode, Tub bench, Walker - 4 wheels, Wheelchair - manual, Grab bars - toilet, Grab bars - tub/shower, Hand held shower head, Transport chair  Prior Device Use: Indicate devices/aids used by the patient prior to current illness, exacerbation or injury? Walker  Prior Functional Level Prior Function Level of Independence: Independent with assistive device(s)(Mod I with RW,  superivsion with adls by [name]) Gait / Transfers Assistance Needed: uses RW for gait throughout the home ADL's / Homemaking Assistance Needed: Husband assists with ADLS and IADLS. Pt reports he sets out her clothes and she is able to manage to put them on herself.  Comments: husband  assists with all activities except for a cleaning person who comes every other week.   Self Care: Did the patient need help bathing, dressing, using the toilet or eating?  Needed some help  Indoor Mobility: Did the patient need assistance with walking from room to room (with or without device)? Independent with RW  Stairs: Did the patient need assistance with internal or external stairs (with or without device)? Needed some help  Functional Cognition: Did the patient need help planning regular tasks such as shopping or remembering to take medications? Needed some help  Current Functional Level Cognition  Overall Cognitive Status: Within Functional Limits for tasks assessed Orientation Level: Oriented X4    Extremity Assessment (includes Sensation/Coordination)  Upper Extremity Assessment: RUE deficits/detail RUE Deficits / Details: RUE with tendency to flex and turn inward forming an indwelled fist at her chest. Pt able to open her hand and extend elbow. Decreased FM skills and strength; pt and husband reporting RUE weaker and less fucntional than before admission RUE Coordination: decreased fine motor, decreased gross motor  Lower Extremity Assessment: Defer to PT evaluation RLE Deficits / Details: weakness noted in RLE at least 3/5 grossly LLE Deficits / Details: weakness noted in LLE. At least 3+/5 grossly    ADLs  Overall ADL's : Needs assistance/impaired Eating/Feeding: Minimal assistance, Sitting Grooming: Maximal assistance, Standing, Cueing for sequencing, Oral care Grooming Details (indicate cue type and reason): Pt requiring physical support to stabilize while performing bilateral tasks  at sink. Pt with posterior lean during dynamic movements. Pt demonstrating poor funcitonal use of RUE/hand. Pt attempting to use R hand for holding tooth bursh and tooth paste; required hand over hand A to maintain grasp in R hand.  Upper Body Bathing: Minimal assistance, Sitting Lower Body Bathing: Maximal assistance, Sit to/from stand Upper Body Dressing : Minimal assistance, Sitting Lower Body Dressing: Maximal assistance, Sit to/from stand Lower Body Dressing Details (indicate cue type and reason): Pt able to doff shoes at EOB by brining ankel to knee. Would require Max A for standing balance during dynamic movement due to posterior lean Toilet Transfer: Minimal assistance, Ambulation, RW Functional mobility during ADLs: Minimal assistance, Rolling walker General ADL Comments: Pt demonstrating decreased fucntional performance due to decreased balance and functional use of RUE. Pt highly motivated and husband is very supprotive    Mobility  Overal bed mobility: Needs Assistance Bed Mobility: Supine to Sit Supine to sit: Min assist, HOB elevated Sit to supine: Min assist General bed mobility comments: minA for trunk elevation and to scoot to EOB    Transfers  Overall transfer level: Needs assistance Equipment used: Rolling walker (2 wheeled) Transfers: Sit to/from Stand Sit to Stand: Min assist, Mod assist General transfer comment: minA to scoot forward to place feet on floor, minA to block feet from sliding, modA to power up to full standing, pt with retropulsion and leaning back on bed with back of legs    Ambulation / Gait / Stairs / Wheelchair Mobility  Ambulation/Gait Ambulation/Gait assistance: Architect (Feet): 20 Feet(around foot of bed) Assistive device: Rolling walker (2 wheeled) Gait Pattern/deviations: Step-to pattern, Decreased step length - left, Decreased stance time - right, Decreased stride length, Trunk flexed, Narrow base of  support General Gait Details: slow gait with forward trunk lean due to kyphotic posture. Minimal foot drop noted on RLE which is compensated with increased hip flexion. Cues for positioning within RW.  Gait velocity: decreased Gait velocity interpretation: Below normal speed for age/gender  Posture / Balance Dynamic Sitting Balance Sitting balance - Comments: Able to doff shoes at EOB without LOB Balance Overall balance assessment: Needs assistance Sitting-balance support: No upper extremity supported, Feet supported Sitting balance-Leahy Scale: Fair Sitting balance - Comments: Able to doff shoes at EOB without LOB Standing balance support: Bilateral upper extremity supported Standing balance-Leahy Scale: Poor Standing balance comment: reliant on RW for stability in standing    Special needs/care consideration BiPAP/CPAP  N/a CPM  N/a Continuous Drip IV  N/a Dialysis  N/a Life Vest  N/a Oxygen  N/a Special Bed injury prevention bundle recommended Trach Size  N/a Wound Vac n/a Skin ecchymosis BUE; old sacral abrasion right and left buttocks noted Bowel mgmt: no LBM documented Bladder mgmt: continent Diabetic mgmt  Hgb A1c 5.6   Previous Home Environment Living Arrangements: [name]/significant other  Lives With: [name](of 12 1/2 years) Available Help at Discharge: Family, Available 24 hours/day Type of Home: House Home Layout: Two level, Able to live on main level with bedroom/bathroom Home Access: Stairs to enter Enter[employer] CorporationEntrance Stairs-Number of Steps: 2 Bathroom Shower/Tub: Tub/shower unit, Engineer, building servicesCurtain Bathroom Toilet: Standard Bathroom Accessibility: Yes How Accessible: Accessible via walker Home Care Services: (no HH since 07/2016) Additional Comments: was receiving OP therapy until June  Discharge Living Setting Plans for Discharge Living Setting: Patient's home, Lives with (comment)([name]) Type of Home at Discharge: House Discharge Home Layout: Two level, Able to live  on main level with bedroom/bathroom Discharge Home Access: Stairs to enter Entrance Stairs-Rails: Right, Left, Can reach both Entrance Stairs-Number of Steps: 2 Discharge Bathroom Shower/Tub: Tub/shower unit, Curtain Discharge Bathroom Toilet: Standard Discharge Bathroom Accessibility: Yes How Accessible: Accessible via walker Does the patient have any problems obtaining your medications?: No  Social/Family/Support Systems Patient Roles: [name], Parent(pt's daughter lives next door) Contact Information: Kelly StallionGeorge "Kelly [name]" Anticipated Caregiver: [name], "Kelly [name]" 83 years old and self sufficient Anticipated Caregiver's Contact Information: see above Ability/Limitations of Caregiver: no limitations Caregiver Availability: 24/7 Discharge Plan Discussed with Primary Caregiver: Yes Is Caregiver In Agreement with Plan?: Yes Does Caregiver/Family have Issues with Lodging/Transportation while Pt is in Rehab?: No  [name] is 83 years old. [name] lives next door to couple. [name]'s daughter [place] 9[place]18 until December 2018.  Goals/Additional Needs Patient/Family Goal for Rehab: supervision PT, supervision to min assist OT, supervision SLP Expected length of stay: ELOS 10- 14 days Special Service Needs: high low bed protocol; injury prevention bundle recommended Pt/Family Agrees to Admission and willing to participate: Yes Program Orientation Provided & Reviewed with Pt/Caregiver Including Roles  & Responsibilities: Yes  Decrease burden of Care through IP rehab admission: n/a  Possible need for SNF placement upon discharge: not expected  Patient Condition: This patient's condition remains as documented in the consult dated 04/05/2017, in which the Rehabilitation Physician determined and documented that the patient's condition is appropriate for intensive rehabilitative care in an inpatient rehabilitation facility. Will admit to inpatient rehab  today.  Preadmission Screen Completed By:  Kelly [name], Kelly [name], 04/05/2017 12:21 PM ______________________________________________________________________   Discussed status with Dr. Allena KatzPatel on 04/05/2017 at  1220  and received telephone approval for admission today.  Admission Coordinator:  Kelly [name], Kelly [name] Burston [name], time [id number] Date 04/05/2017             Cosigned by: Marcello FennelPatel, Ankit Anil, MD at 04/05/2017 12:28 PM  Revision History

## 2017-04-05 NOTE — Progress Notes (Signed)
I met with pt, spouse and daughter at bedside. We discussed goals and expectations of an inpt rehab admission. They prefer inpt rehab as she received CIR services last September. I contacted Dr. P. Patel and he is in agreement to d/c. I will make the arrangements to admit today. I will notify RN CM and [phone] 

## 2017-04-05 NOTE — Progress Notes (Signed)
Physical Medicine and Rehabilitation Consult Reason for Consult: Right sided weakness with dysphagia Referring Physician: [facility]   HPI: Kelly [name] is a 83 y.o. right handed female with history of hypertension, diastolic congestive heart failure, prediabetes, left MCA with right-sided residual weakness maintained on aspirin and received inpatient rehabilitation services September 2017 and discharged to home at a supervision level ambulating 120 feet. Per chart review and patient, patient lives with spouse. 2 level home with main level bedroom and bathroom and 2 steps to entry. She used a rolling walker for ambulation. Husband assists with some basic ADLs. Presented 04/03/2017 with increasing right sided weakness. Cranial CT reviewed, showing no acute intracranial process. Patient did not receive TPA. MRI revealed subacute enhancing 4 mm left parietal infarct. Subacute small left parietal white matter infarct. Small bifrontal and right parietal infarcts in MCA territories. MRA with no emergent large vessel occlusion or stenosis. Echocardiogram as well as carotid Dopplers are pending. Neurology consulted present maintained on Plavix for CVA prophylaxis. Subcutaneous Lovenox for DVT prophylaxis. Maintained on a regular consistency diet. Physical therapy evaluation completed. M.D. has requested physical medicine rehabilitation consult.   Review of Systems  Constitutional: Negative for chills and fever.  HENT: Negative for hearing loss.   Eyes: Negative for blurred vision and double vision.  Respiratory: Negative for cough.   Cardiovascular: Negative for chest pain and palpitations.  Gastrointestinal: Negative for nausea and vomiting.  Genitourinary: Negative for dysuria and hematuria.  Musculoskeletal: Positive for joint pain and myalgias.  Skin: Negative for rash.  Neurological: Positive for focal weakness and headaches. Negative for seizures.  Psychiatric/Behavioral: Positive for  depression.  All other systems reviewed and are negative.      Past Medical History:  Diagnosis Date  . Acute blood loss anemia   . Adjustment disorder with depressed mood   . CVA (cerebral vascular accident) (HCC) 02/03/2016   Linear infarct within the posterior limb of left internal capsule  . Diastolic CHF (HCC)   . Dysphagia   . Dysphonia 02/20/2013  . Essential and other specified forms of tremor 02/20/2013  . HLD (hyperlipidemia)   . HTN (hypertension)   . OAB (overactive bladder)   . Osteoporosis   . Patient receiving subcutaneous heparin    For DVT prophylaxis 9/17  . Prediabetes   . Psoriasis   . Slow transit constipation   . Stroke (cerebrum) (HCC) 02/01/2016  . Trigeminal neuralgia 02/20/2013        Past Surgical History:  Procedure Laterality Date  . APPENDECTOMY  1940  . bladder tack    . CATARACT EXTRACTION Bilateral   . HEMORRHOID SURGERY  1960  . HUMERUS FRACTURE SURGERY    . LAPAROSCOPIC HYSTERECTOMY  2006  . torn rotator cuff    . WRIST FRACTURE SURGERY  2005   seconday shoulder 2001        Family History  Problem Relation Age of Onset  . Arthritis Brother        spine  . Osteoporosis Maternal Grandmother    Social History:  reports that  has never smoked. she has never used smokeless tobacco. She reports that she drinks alcohol. She reports that she does not use drugs. Allergies: No Known Allergies       Medications Prior to Admission  Medication Sig Dispense Refill  . aspirin 325 MG tablet Take 1 tablet (325 mg total) by mouth daily.    Marland Kitchen. atorvastatin (LIPITOR) 40 MG tablet Take 1 tablet (40  mg total) by mouth daily at 6 PM.    . FLUoxetine (PROZAC) 20 MG tablet TAKE ONE TABLET BY MOUTH ONCE DAILY 90 tablet 0  . ibuprofen (ADVIL,MOTRIN) 200 MG tablet Take 200 mg 2 (two) times daily by mouth.    . oxybutynin (DITROPAN) 5 MG tablet Take 5 mg at bedtime by mouth.    . topiramate (TOPAMAX) 50 MG tablet Take  50 mg by mouth 2 (two) times daily.      Home: Home Living Family/patient expects to be discharged to:: Private residence Living Arrangements: Spouse/significant other Available Help at Discharge: Family, Available 24 hours/day Type of Home: House Home Access: Stairs to enter [employer] of Steps: 2 Home Layout: Two level, Able to live on main level with bedroom/bathroom Bathroom Shower/Tub: Tub/shower unit, Engineer, building services: Standard Home Equipment: Environmental consultant - 2 wheels, Bedside commode, Tub bench, Walker - 4 wheels, Wheelchair - manual, Grab bars - toilet, Grab bars - tub/shower, Hand held shower head, Transport chair  Functional History: Prior Function Level of Independence: Needs assistance Gait / Transfers Assistance Needed: uses RW for gait throughout the home ADL's / Homemaking Assistance Needed: Husband assists with ADLS and IADLS. Pt reports he sets out her clothes and she is able to manage to put them on herself.  Comments: husband assists with all activities except for a cleaning person who comes every other week.  Functional Status:  Mobility: Bed Mobility Overal bed mobility: Needs Assistance Bed Mobility: Sit to Supine Supine to sit: Min assist, HOB elevated Sit to supine: Min assist General bed mobility comments: Min A to reposition legs in bed. Pt able to transition from sitting to supien with increased time Transfers Overall transfer level: Needs assistance Equipment used: Rolling walker (2 wheeled) Transfers: Sit to/from Stand Sit to Stand: Min assist General transfer comment: Min A to power into standing. requiring increased time to gain stability and posture. Pt with tendency for posterior lean during transitions.  Ambulation/Gait Ambulation/Gait assistance: Min assist Ambulation Distance (Feet): 25 Feet Assistive device: Rolling walker (2 wheeled) Gait Pattern/deviations: Step-to pattern, Decreased step length - left, Decreased stance  time - right, Decreased stride length, Trunk flexed, Narrow base of support General Gait Details: slow gait with forward trunk lean due to kyphotic posture. Minimal foot drop noted on RLE which is compensated with increased hip flexion. Cues for positioning within RW.  Gait velocity: decreased Gait velocity interpretation: Below normal speed for age/gender  ADL: ADL Overall ADL's : Needs assistance/impaired Eating/Feeding: Minimal assistance, Sitting Grooming: Maximal assistance, Standing, Cueing for sequencing, Oral care Grooming Details (indicate cue type and reason): Pt requiring physical support to stabilize while performing bilateral tasks at sink. Pt with posterior lean during dynamic movements. Pt demonstrating poor funcitonal use of RUE/hand. Pt attempting to use R hand for holding tooth bursh and tooth paste; required hand over hand A to maintain grasp in R hand.  Upper Body Bathing: Minimal assistance, Sitting Lower Body Bathing: Maximal assistance, Sit to/from stand Upper Body Dressing : Minimal assistance, Sitting Lower Body Dressing: Maximal assistance, Sit to/from stand Lower Body Dressing Details (indicate cue type and reason): Pt able to doff shoes at EOB by brining ankel to knee. Would require Max A for standing balance during dynamic movement due to posterior lean Toilet Transfer: Minimal assistance, Ambulation, RW Functional mobility during ADLs: Minimal assistance, Rolling walker General ADL Comments: Pt demonstrating decreased fucntional performance due to decreased balance and functional use of RUE. Pt highly motivated and husband is  very supprotive  Cognition: Cognition Overall Cognitive Status: Within Functional Limits for tasks assessed Orientation Level: Oriented X4 Cognition Arousal/Alertness: Awake/alert Behavior During Therapy: WFL for tasks assessed/performed Overall Cognitive Status: Within Functional Limits for tasks assessed  Blood pressure 134/67,  pulse 64, temperature 97.7 F (36.5 C), temperature source Oral, resp. rate 20, height 5' (1.524 m), weight 44.2 kg (97 lb 8 oz), SpO2 96 %. Physical Exam  Vitals reviewed. Constitutional: She is oriented to person, place, and time. She appears well-developed.  Frail  HENT:  Head: Normocephalic and atraumatic.  Eyes: EOM are normal. Right eye exhibits no discharge. Left eye exhibits no discharge.  Neck: Normal range of motion. Neck supple. No thyromegaly present.  Cardiovascular: Normal rate, regular rhythm and normal heart sounds.  Respiratory: Effort normal and breath sounds normal. No respiratory distress.  GI: Soft. Bowel sounds are normal. She exhibits no distension.  Musculoskeletal: She exhibits no edema or tenderness.  Neurological: She is alert and oriented to person, place, and time.  Makes good eye contact with examiner.  Follows commands.  Right facial weakness Motor: LUE/LLE: 4-/5 proximal to distal RUE: 3+/5 proximal to distal.  Increased tone. RLE: 4-/5 proximal to distal (weaker than left)  Skin: Skin is warm and dry.  Psychiatric: She has a normal mood and affect. Her behavior is normal. Thought content normal.    LabResultsLast24Hours       Results for orders placed or performed during the hospital encounter of 04/03/17 (from the past 24 hour(s))  Glucose, capillary     Status: None   Collection Time: 04/04/17  6:32 AM  Result Value Ref Range   Glucose-Capillary 82 65 - 99 mg/dL   Comment 1 Notify RN    Comment 2 Document in Chart   Glucose, capillary     Status: Abnormal   Collection Time: 04/04/17 11:32 AM  Result Value Ref Range   Glucose-Capillary 104 (H) 65 - 99 mg/dL  Glucose, capillary     Status: Abnormal   Collection Time: 04/04/17  4:59 PM  Result Value Ref Range   Glucose-Capillary 107 (H) 65 - 99 mg/dL  Glucose, capillary     Status: None   Collection Time: 04/04/17  9:35 PM  Result Value Ref Range   Glucose-Capillary 91 65  - 99 mg/dL   Comment 1 Notify RN    Comment 2 Document in Chart       ImagingResults(Last48hours)  Dg Chest 2 View  Result Date: 04/03/2017 CLINICAL DATA:  Code stroke, crackles on lung exam EXAM: CHEST  2 VIEW COMPARISON:  02/01/2016 FINDINGS: Low lung volumes. No focal pulmonary infiltrate, consolidation or effusion. Cardiomediastinal silhouette within normal limits. Aortic atherosclerosis. No pneumothorax. Calcified breast implants. Stable mild wedge deformities of mid to upper thoracic vertebra. IMPRESSION: No active cardiopulmonary disease. Electronically Signed   By: Jasmine PangKim  Fujinaga M.D.   On: 04/03/2017 23:46   Mr Angiogram Head Wo Contrast  Result Date: 04/04/2017 CLINICAL DATA:  Increasing RIGHT-sided weakness. Suspect stroke. History of stroke, trigeminal neuralgia, tremor, hypertension, hyperlipidemia. EXAM: MRI HEAD WITHOUT AND WITH CONTRAST MRA HEAD WITHOUT CONTRAST TECHNIQUE: Multiplanar, multiecho pulse sequences of the brain and surrounding structures were obtained without and with intravenous contrast. Angiographic images of the head were obtained using MRA technique without contrast. CONTRAST:  9 cc MultiHance COMPARISON:  CT HEAD April 03, 2017 at 2218 hours and MRI of the head February 01, 2016 FINDINGS: MRI HEAD FINDINGS INTRACRANIAL CONTENTS: 4 mm reduced diffusion LEFT parietal cortex at  convexity, too small to characterize on ADC maps, though there is faint enhancement. Reduced diffusion in the LEFT parietal white matter with normalized ADC values. Chronic RIGHT parietal microhemorrhage. Small areas bifrontal and RIGHT parietal encephalomalacia. Confluent supratentorial white matter FLAIR T2 hyperintensities. Patchy pontine white matter FLAIR T2 hyperintensities. Early LEFT cerebral peduncle wallerian degeneration. Old LEFT posterior limb of the internal capsule lacunar infarct. Old RIGHT basal ganglia lacunar infarct. Prominent basal ganglia and thalamus  perivascular spaces associated with chronic small vessel ischemic disease. No midline shift, mass effect or masses. Moderate to severe parenchymal brain volume loss. No hydrocephalus. No abnormal parenchymal or extra-axial enhancement. No extra-axial fluid collections or masses. VASCULAR: See below. SKULL AND UPPER CERVICAL SPINE: No abnormal sellar expansion. No suspicious calvarial bone marrow signal. Craniocervical junction maintained. SINUSES/ORBITS: The mastoid air-cells and included paranasal sinuses are well-aerated.The included ocular globes and orbital contents are non-suspicious. Status post bilateral ocular lens implants. OTHER: None. MRA HEAD FINDINGS ANTERIOR CIRCULATION: Normal flow related enhancement of the included cervical, petrous, cavernous and supraclinoid internal carotid arteries. RIGHT posterior communicating artery origin infundibulum. Patent anterior communicating artery. Patent anterior and middle cerebral arteries, including distal segments. No large vessel occlusion, flow limiting stenosis, aneurysm. POSTERIOR CIRCULATION: Codominant vertebral artery's. Basilar artery is patent, with normal flow related enhancement of the main branch vessels. Fenestrated proximal basilar artery. Patent posterior cerebral arteries. Mild stenosis bilateral P2 segments compatible with atherosclerosis. No large vessel occlusion, flow limiting stenosis,  aneurysm. ANATOMIC VARIANTS: None. Source images and MIP images were reviewed. IMPRESSION: MRI HEAD: 1. Subacute enhancing 4 mm LEFT parietal infarct. Subacute small LEFT parietal white matter infarct. 2. Small bifrontal and RIGHT parietal infarcts in MCA territories. 3. Severe chronic small vessel ischemic disease. Old lacunar infarcts. MRA HEAD: 1. No emergent large vessel occlusion or flow limiting stenosis. Electronically Signed   By: Awilda Metro M.D.   On: 04/04/2017 01:37   Mr Laqueta Jean And Wo Contrast  Result Date: 04/04/2017 CLINICAL DATA:   Increasing RIGHT-sided weakness. Suspect stroke. History of stroke, trigeminal neuralgia, tremor, hypertension, hyperlipidemia. EXAM: MRI HEAD WITHOUT AND WITH CONTRAST MRA HEAD WITHOUT CONTRAST TECHNIQUE: Multiplanar, multiecho pulse sequences of the brain and surrounding structures were obtained without and with intravenous contrast. Angiographic images of the head were obtained using MRA technique without contrast. CONTRAST:  9 cc MultiHance COMPARISON:  CT HEAD April 03, 2017 at 2218 hours and MRI of the head February 01, 2016 FINDINGS: MRI HEAD FINDINGS INTRACRANIAL CONTENTS: 4 mm reduced diffusion LEFT parietal cortex at convexity, too small to characterize on ADC maps, though there is faint enhancement. Reduced diffusion in the LEFT parietal white matter with normalized ADC values. Chronic RIGHT parietal microhemorrhage. Small areas bifrontal and RIGHT parietal encephalomalacia. Confluent supratentorial white matter FLAIR T2 hyperintensities. Patchy pontine white matter FLAIR T2 hyperintensities. Early LEFT cerebral peduncle wallerian degeneration. Old LEFT posterior limb of the internal capsule lacunar infarct. Old RIGHT basal ganglia lacunar infarct. Prominent basal ganglia and thalamus perivascular spaces associated with chronic small vessel ischemic disease. No midline shift, mass effect or masses. Moderate to severe parenchymal brain volume loss. No hydrocephalus. No abnormal parenchymal or extra-axial enhancement. No extra-axial fluid collections or masses. VASCULAR: See below. SKULL AND UPPER CERVICAL SPINE: No abnormal sellar expansion. No suspicious calvarial bone marrow signal. Craniocervical junction maintained. SINUSES/ORBITS: The mastoid air-cells and included paranasal sinuses are well-aerated.The included ocular globes and orbital contents are non-suspicious. Status post bilateral ocular lens implants. OTHER: None. MRA HEAD FINDINGS ANTERIOR CIRCULATION:  Normal flow related enhancement of  the included cervical, petrous, cavernous and supraclinoid internal carotid arteries. RIGHT posterior communicating artery origin infundibulum. Patent anterior communicating artery. Patent anterior and middle cerebral arteries, including distal segments. No large vessel occlusion, flow limiting stenosis, aneurysm. POSTERIOR CIRCULATION: Codominant vertebral artery's. Basilar artery is patent, with normal flow related enhancement of the main branch vessels. Fenestrated proximal basilar artery. Patent posterior cerebral arteries. Mild stenosis bilateral P2 segments compatible with atherosclerosis. No large vessel occlusion, flow limiting stenosis,  aneurysm. ANATOMIC VARIANTS: None. Source images and MIP images were reviewed. IMPRESSION: MRI HEAD: 1. Subacute enhancing 4 mm LEFT parietal infarct. Subacute small LEFT parietal white matter infarct. 2. Small bifrontal and RIGHT parietal infarcts in MCA territories. 3. Severe chronic small vessel ischemic disease. Old lacunar infarcts. MRA HEAD: 1. No emergent large vessel occlusion or flow limiting stenosis. Electronically Signed   By: Awilda Metro M.D.   On: 04/04/2017 01:37   Ct Head Code Stroke Wo Contrast  Addendum Date: 04/04/2017   ADDENDUM REPORT: 04/04/2017 01:06 ADDENDUM: Critical Value/emergent results were called by telephone at the time of interpretation on 04/04/2017 at 1:05 am to Dr. Otelia Limes, Neurology, who verbally acknowledged these results. Electronically Signed   By: Awilda Metro M.D.   On: 04/04/2017 01:06   Result Date: 04/04/2017 CLINICAL DATA:  Code stroke.  RIGHT-sided weakness. EXAM: CT HEAD WITHOUT CONTRAST TECHNIQUE: Contiguous axial images were obtained from the base of the skull through the vertex without intravenous contrast. COMPARISON:  CT HEAD February 01, 2016 and MRI of the head February 01, 2016 FINDINGS: BRAIN: No intraparenchymal hemorrhage, mass effect nor midline shift. Confluent similar supratentorial and  pontine white matter hypodensities. No acute large vascular territory infarcts. Small areas RIGHT frontoparietal encephalomalacia. Old LEFT posterior limb of in the internal capsule small infarct. Faint hypodensities bilateral basal ganglia and thalamus a responding to perivascular spaces associated with chronic small vessel ischemic disease. No abnormal extra-axial fluid collections. Basal cisterns are patent. VASCULAR: Moderate calcific atherosclerosis of the carotid siphons. SKULL: No skull fracture. No significant scalp soft tissue swelling. SINUSES/ORBITS: The mastoid air-cells and included paranasal sinuses are well-aerated.The included ocular globes and orbital contents are non-suspicious. OTHER: None. ASPECTS [facility] Stroke Program Early CT Score) - Ganglionic level infarction (caudate, lentiform nuclei, internal capsule, insula, M1-M3 cortex): 7 - Supraganglionic infarction (M4-M6 cortex): 3 Total score (0-10 with 10 being normal): 10 IMPRESSION: 1. No acute intracranial process. 2. Moderate to severe chronic small vessel ischemic disease and old lacunar infarcts. 3. Small areas RIGHT frontoparietal encephalomalacia most compatible old RIGHT MCA territory infarcts. 4. ASPECTS is 10. 5. Dr. Otelia Limes, Neurology paged via [facility] secure hospital paging system on 04/03/2017 at 10:28 pm, awaiting return call. Electronically Signed: By: Awilda Metro M.D. On: 04/03/2017 22:32     Assessment/Plan: Diagnosis: Left CVA Labs and images independently reviewed.  Records reviewed and summated above. Stroke: Continue secondary stroke prophylaxis and Risk Factor Modification listed below:   Antiplatelet therapy:   Blood Pressure Management:  Continue current medication with prn's with permisive HTN per primary team Statin Agent:   Prediabetes management:   Right sided hemiparesis Motor recovery: Fluoxetine  1. Does the need for close, 24 hr/day medical supervision in concert with the patient's rehab  needs make it unreasonable for this patient to be served in a less intensive setting? Yes  2. Co-Morbidities requiring supervision/potential complications: HTN (monitor and provide prns in accordance with increased physical exertion and pain), diastolic congestive heart failure (monitor  for signs/symptoms of fluid overload), prediabetes (Monitor in accordance with exercise and adjust meds as necessary), left MCA with right-sided residual spastic hemiparesis, ABLA (transfuse if necessary to ensure appropriate perfusion for increased activity tolerance) 3. Due to safety, disease management and patient education, does the patient require 24 hr/day rehab nursing? Yes 4. Does the patient require coordinated care of a physician, rehab nurse, PT (1-2 hrs/day, 5 days/week) and OT (1-2 hrs/day, 5 days/week) to address physical and functional deficits in the context of the above medical diagnosis(es)? Yes Addressing deficits in the following areas: balance, endurance, locomotion, strength, transferring, bathing, dressing, toileting and psychosocial support 5. Can the patient actively participate in an intensive therapy program of at least 3 hrs of therapy per day at least 5 days per week? Yes 6. The potential for patient to make measurable gains while on inpatient rehab is excellent 7. Anticipated functional outcomes upon discharge from inpatient rehab are supervision  with PT, supervision and min assist with OT, n/a with SLP. 8. Estimated rehab length of stay to reach the above functional goals is: 10-14 days. 9. Anticipated D/C setting: Home 10. Anticipated post D/C treatments: HH therapy and Home excercise program 11. Overall Rehab/Functional Prognosis: good  RECOMMENDATIONS: This patient's condition is appropriate for continued rehabilitative care in the following setting: CIR after completion of medical workup. Patient has agreed to participate in recommended program. Yes Note that insurance prior  authorization may be required for reimbursement for recommended care.  Comment: Rehab Admissions Coordinator to follow up.  Maryla Morrow, MD, ABPMR Mariam Dollar J., PA-C 04/05/2017

## 2017-04-05 NOTE — Clinical Social Work Note (Signed)
CSW acknowledges SNF consult. Patient discharging to CIR today.  CSW signing off.  Isiah Scheel, CSW [phone]  

## 2017-04-05 NOTE — Progress Notes (Signed)
STROKE TEAM PROGRESS NOTE    SUBJECTIVE (INTERVAL HISTORY) Her  Daughter is at the bedside. Right-sided weakness continues to  improve. Voices no new complaints and no new acute events reported overnight by nursing  OBJECTIVE Temp:  [97.5 F (36.4 C)-99.4 F (37.4 C)] 99.4 F (37.4 C) (11/12 1009) Pulse Rate:  [64-99] 71 (11/12 1009) Cardiac Rhythm: Normal sinus rhythm (11/12 0800) Resp:  [16-20] 18 (11/12 1009) BP: (95-141)/(54-67) 132/62 (11/12 1009) SpO2:  [95 %-100 %] 97 % (11/12 1009) Physical Exam frail elderly Caucasian lady not in distress. . Afebrile. Head is nontraumatic. Neck is supple without bruit.    Cardiac exam no murmur or gallop. Lungs are clear to auscultation. Distal pulses are well felt.  Neurological Exam : awake alert oriented 3. Diminished attention and recall. No aphasia or apraxia dysarthria. Follows commands well. Extraocular moments are full range without nystagmus. Blinks to threat bilaterally. Mild right lower facial weakness. Tongue midline. Spastic whole right hemiparesis with grade 3-4/5 right upper extremity and 4/5 right lower extremity strength. Weakness of right grip and intrinsic hand muscles. Orbits left over right upper extremity. Tone is increased on the right upper and lower extremity. Deep tendon reflexes are brisker on the right compared to the left. Right plantar upgoing left downgoing. Coordination is slightly impaired on the right. Sensation is intact. Gait not tested.  CBC:  Recent Labs  Lab 04/03/17 2214 04/03/17 2223  WBC 9.4  --   NEUTROABS 6.4  --   HGB 11.8* 11.6*  HCT 36.3 34.0*  MCV 90.5  --   PLT 164  --     Basic Metabolic Panel:  Recent Labs  Lab 04/03/17 2214 04/03/17 2223  NA 139 143  K 3.6 3.6  CL 111 108  CO2 23  --   GLUCOSE 109* 111*  BUN 23* 23*  CREATININE 0.77 0.70  CALCIUM 8.7*  --     Lipid Panel:     Component Value Date/Time   CHOL 99 04/04/2017 0408   TRIG 38 04/04/2017 0408   HDL 33 (L)  04/04/2017 0408   CHOLHDL 3.0 04/04/2017 0408   VLDL 8 04/04/2017 0408   LDLCALC 58 04/04/2017 0408   HgbA1c:  Lab Results  Component Value Date   HGBA1C 5.6 04/04/2017   Urine Drug Screen:     Component Value Date/Time   LABOPIA NONE DETECTED 04/03/2017 2214   COCAINSCRNUR NONE DETECTED 04/03/2017 2214   LABBENZ NONE DETECTED 04/03/2017 2214   AMPHETMU NONE DETECTED 04/03/2017 2214   THCU NONE DETECTED 04/03/2017 2214   LABBARB NONE DETECTED 04/03/2017 2214    Alcohol Level     Component Value Date/Time   ETH <10 04/03/2017 2214    IMAGING  Dg Chest 2 View 04/03/2017 IMPRESSION:  No active cardiopulmonary disease.   Mr Angiogram Head Wo Contrast 04/04/2017 IMPRESSION:   MRI HEAD:  1. Subacute enhancing 4 mm LEFT parietal infarct. Subacute small LEFT parietal white matter infarct.  2. Small bifrontal and RIGHT parietal infarcts in MCA territories.  3. Severe chronic small vessel ischemic disease. Old lacunar infarcts.   MRA HEAD:  No emergent large vessel occlusion or flow limiting stenosis.   Ct Head Code Stroke Wo Contrast 04/04/2017   IMPRESSION:  1. No acute intracranial process.  2. Moderate to severe chronic small vessel ischemic disease and old lacunar infarcts. 3. Small areas RIGHT frontoparietal encephalomalacia most compatible old RIGHT MCA territory infarcts.  4. ASPECTS is 10.   Bilateral Carotid Dopplers -  04/05/17 No evidence of a significant stenosis in bilateral carotid arteries. Vertebral arteries are patent with antegrade flow.  ECHO - PENDING   PHYSICAL EXAM Vitals:   04/04/17 2135 04/05/17 0200 04/05/17 0701 04/05/17 1009  BP: (!) 141/67 134/67 140/63 132/62  Pulse: 66 64 64 71  Resp: 18 20 18 18   Temp: (!) 97.5 F (36.4 C) 97.7 F (36.5 C) 98.2 F (36.8 C) 99.4 F (37.4 C)  TempSrc: Oral Oral Oral Oral  SpO2: 100% 96% 95% 97%  Weight:      Height:         ASSESSMENT/PLAN Kelly [name] is a 83 y.o. female with  history of previous strokes, CHF, HLD and HTN presenting with increased Rt sided weakness. She did not receive IV t-PA due to risk outweighing benefit.  04/05/17: Neuro exam unchanged. Right sided weakness improving slowly. To be evaluated for CIR today. POC discussed with family and Dr Allena KatzPatel. Continue on Plavix and Lipitor for now.  Stroke: multiple bilateral infarcts - embolic - source unknown. Etiology probably embolic patient is not a good long-term anticoagulation candidate given her Prevacid and spastic hemiplegia, advanced age and fall risk situation. Hence does not recommend doing TEE and loop recorder. Change aspirin to Plavix for stroke prevention   Resultant  slight worsening of pre-existing spastic right hemiparesis  CT head - No acute intracranial process.  MRI head - Subacute enhancing 4 mm LEFT parietal infarct. Subacute small LEFT parietal white matter infarct. Small bifrontal and RIGHT parietal infarcts in MCA territories.   MRA head - No emergent large vessel occlusion or flow limiting stenosis.   Carotid Doppler - Negative  2D Echo - completed and results pending  LDL - 58  HgbA1c - 5.6  VTE prophylaxis -  - Lovenox Diet heart healthy/carb modified Room service appropriate? Yes; Fluid consistency: Thin  aspirin 325 mg daily prior to admission, now on Plavix 75 mg Daily  Patient counseled to be compliant with her antithrombotic medications  Ongoing aggressive stroke risk factor management  Therapy recommendations:  CIR  Disposition:  CIR  Hypertension  Stable  Permissive hypertension (OK if < 220/120) but gradually normalize in 5-7 days  Long-term BP goal normotensive  Hyperlipidemia  Home meds:  Lipitor 40 mg dailyresumed in hospital  LDL 58, goal < 70  Continue statin at discharge   Other Stroke Risk Factors  Advanced age  ETOH use, advised to drink no more than 1 drink per day.  Hx stroke/TIA   Other Active Problems  Mild  anemia  Hospital day # 2  [name], ANP-C Stroke Team 04/05/2017 12:33 PM I have personally examined this patient, reviewed notes, independently viewed imaging studies, participated in medical decision making and plan of care.ROS completed by me personally and pertinent positives fully documented  I have made any additions or clarifications directly to the above note. Agree with note above.    Delia HeadyPramod Aubriegh Minch, MD Medical Director [facility] Pager: [phone] 04/05/2017 1:26 PM  Neurology to sign off. Please call with any further question or concerns. Thank you for this consultation  To contact Stroke Continuity provider, please refer to [email or web address]. After hours, contact General Neurology

## 2017-04-05 NOTE — H&P (Signed)
Physical Medicine and Rehabilitation Admission H&P    Chief Complaint  Patient presents with  . Code Stroke  : HPI: Kelly [name] is a 83 y.o. right handed female with history of documented hypertension, diastolic congestive heart failure, prediabetes, left MCA with right-sided residual weakness maintained on aspirin and received inpatient rehabilitation services September 2017 and discharged to home at a supervision level ambulating 120 feet. Per chart review and patient, patient lives with spouse. 2 level home with main level bedroom and bathroom and 2 steps to entry. She used a rolling walker for ambulation. Husband assists with some basic ADLs. Presented 04/03/2017 with increasing right sided weakness. Cranial CT reviewed, showing no acute intracranial process. Patient did not receive TPA. MRI revealed subacute enhancing 4 mm left parietal infarct. Subacute small left parietal white matter infarct. Small bifrontal and right parietal infarcts in MCA territories. MRA with no emergent large vessel occlusion or stenosis. Carotid Dopplers in no ICA stenosis. Echocardiogram pending. Neurology consulted present maintained on Plavix for CVA prophylaxis. Subcutaneous Lovenox for DVT prophylaxis. Maintained on a regular consistency diet. Physical and occupational therapy evaluations completed. M.D. has requested physical medicine rehabilitation consult. Patient was admitted for a comprehensive rehabilitation program    Review of Systems  Constitutional: Negative for chills and fever.  HENT: Negative for hearing loss.   Eyes: Negative for blurred vision and double vision.  Respiratory: Negative for shortness of breath.   Cardiovascular: Positive for leg swelling. Negative for chest pain and palpitations.  Gastrointestinal: Positive for constipation. Negative for nausea and vomiting.  Musculoskeletal: Positive for myalgias.  Skin: Negative for rash.  Neurological: Positive for focal weakness and  headaches.  Psychiatric/Behavioral: Positive for depression.  All other systems reviewed and are negative.  Past Medical History:  Diagnosis Date  . Acute blood loss anemia   . Adjustment disorder with depressed mood   . CVA (cerebral vascular accident) ([place]) 02/03/2016   Linear infarct within the posterior limb of left internal capsule  . Diastolic CHF ([place])   . Dysphagia   . Dysphonia 02/20/2013  . Essential and other specified forms of tremor 02/20/2013  . HLD (hyperlipidemia)   . HTN (hypertension)   . OAB (overactive bladder)   . Osteoporosis   . Patient receiving subcutaneous heparin    For DVT prophylaxis 9/17  . Prediabetes   . Psoriasis   . Slow transit constipation   . Stroke (cerebrum) ([place]) 02/01/2016  . Trigeminal neuralgia 02/20/2013   Past Surgical History:  Procedure Laterality Date  . APPENDECTOMY  1940  . bladder tack    . CATARACT EXTRACTION Bilateral   . HEMORRHOID SURGERY  1960  . HUMERUS FRACTURE SURGERY    . LAPAROSCOPIC HYSTERECTOMY  2006  . torn rotator cuff    . WRIST FRACTURE SURGERY  2005   seconday shoulder 2001   Family History  Problem Relation Age of Onset  . Arthritis Brother        spine  . Osteoporosis Maternal Grandmother    Social History:  reports that  has never smoked. she has never used smokeless tobacco. She reports that she drinks alcohol. She reports that she does not use drugs. Allergies: No Known Allergies Medications Prior to Admission  Medication Sig Dispense Refill  . aspirin 325 MG tablet Take 1 tablet (325 mg total) by mouth daily.    Marland Kitchen atorvastatin (LIPITOR) 40 MG tablet Take 1 tablet (40 mg total) by mouth daily at 6 PM.    .  FLUoxetine (PROZAC) 20 MG tablet TAKE ONE TABLET BY MOUTH ONCE DAILY 90 tablet 0  . ibuprofen (ADVIL,MOTRIN) 200 MG tablet Take 200 mg 2 (two) times daily by mouth.    . oxybutynin (DITROPAN) 5 MG tablet Take 5 mg at bedtime by mouth.    . topiramate (TOPAMAX) 50 MG tablet Take 50 mg by mouth  2 (two) times daily.      Drug Regimen Review Drug regimen was reviewed and remains appropriate with no significant issues identified  Home: Home Living Family/patient expects to be discharged to:: Private residence Living Arrangements: Spouse/significant other Available Help at Discharge: Family, Available 24 hours/day Type of Home: House Home Access: Stairs to enter [employer] of Steps: 2 Home Layout: Two level, Able to live on main level with bedroom/bathroom Bathroom Shower/Tub: Tub/shower unit, Architectural technologist: Standard Home Equipment: Environmental consultant - 2 wheels, Bedside commode, Tub bench, Walker - 4 wheels, Wheelchair - manual, Grab bars - toilet, Grab bars - tub/shower, Hand held shower head, Transport chair   Functional History: Prior Function Level of Independence: Needs assistance Gait / Transfers Assistance Needed: uses RW for gait throughout the home ADL's / Homemaking Assistance Needed: Husband assists with ADLS and IADLS. Pt reports he sets out her clothes and she is able to manage to put them on herself.  Comments: husband assists with all activities except for a cleaning person who comes every other week.   Functional Status:  Mobility: Bed Mobility Overal bed mobility: Needs Assistance Bed Mobility: Sit to Supine Supine to sit: Min assist, HOB elevated Sit to supine: Min assist General bed mobility comments: Min A to reposition legs in bed. Pt able to transition from sitting to supien with increased time Transfers Overall transfer level: Needs assistance Equipment used: Rolling walker (2 wheeled) Transfers: Sit to/from Stand Sit to Stand: Min assist General transfer comment: Min A to power into standing. requiring increased time to gain stability and posture. Pt with tendency for posterior lean during transitions.  Ambulation/Gait Ambulation/Gait assistance: Min assist Ambulation Distance (Feet): 25 Feet Assistive device: Rolling walker (2  wheeled) Gait Pattern/deviations: Step-to pattern, Decreased step length - left, Decreased stance time - right, Decreased stride length, Trunk flexed, Narrow base of support General Gait Details: slow gait with forward trunk lean due to kyphotic posture. Minimal foot drop noted on RLE which is compensated with increased hip flexion. Cues for positioning within RW.  Gait velocity: decreased Gait velocity interpretation: Below normal speed for age/gender    ADL: ADL Overall ADL's : Needs assistance/impaired Eating/Feeding: Minimal assistance, Sitting Grooming: Maximal assistance, Standing, Cueing for sequencing, Oral care Grooming Details (indicate cue type and reason): Pt requiring physical support to stabilize while performing bilateral tasks at sink. Pt with posterior lean during dynamic movements. Pt demonstrating poor funcitonal use of RUE/hand. Pt attempting to use R hand for holding tooth bursh and tooth paste; required hand over hand A to maintain grasp in R hand.  Upper Body Bathing: Minimal assistance, Sitting Lower Body Bathing: Maximal assistance, Sit to/from stand Upper Body Dressing : Minimal assistance, Sitting Lower Body Dressing: Maximal assistance, Sit to/from stand Lower Body Dressing Details (indicate cue type and reason): Pt able to doff shoes at EOB by brining ankel to knee. Would require Max A for standing balance during dynamic movement due to posterior lean Toilet Transfer: Minimal assistance, Ambulation, RW Functional mobility during ADLs: Minimal assistance, Rolling walker General ADL Comments: Pt demonstrating decreased fucntional performance due to decreased balance and functional use  of RUE. Pt highly motivated and husband is very supprotive  Cognition: Cognition Overall Cognitive Status: Within Functional Limits for tasks assessed Orientation Level: Oriented X4 Cognition Arousal/Alertness: Awake/alert Behavior During Therapy: WFL for tasks  assessed/performed Overall Cognitive Status: Within Functional Limits for tasks assessed  Physical Exam: Blood pressure 140/63, pulse 64, temperature 98.2 F (36.8 C), temperature source Oral, resp. rate 18, height 5' (1.524 m), weight 44.2 kg (97 lb 8 oz), SpO2 95 %. Physical Exam  Vitals reviewed. Constitutional: She is oriented to person, place, and time. She appears well-developed and well-nourished.  HENT:  Head: Normocephalic and atraumatic.  Eyes: EOM are normal. Right eye exhibits no discharge. Left eye exhibits no discharge.  Neck: Normal range of motion. Neck supple. No thyromegaly present.  Cardiovascular: Normal rate, regular rhythm and normal heart sounds.  Respiratory: Effort normal and breath sounds normal. No respiratory distress.  GI: Soft. Bowel sounds are normal. She exhibits no distension.  Musculoskeletal: She exhibits no edema or tenderness.  Neurological: She is alert and oriented to person, place, and time.  Makes good eye contact with examiner.  Follows commands.  Right facial weakness Motor: LUE/LLE: 4-/5 proximal to distal RUE: 3+/5 proximal to distal.  Increased tone. RLE: 4-/5 proximal to distal (weaker than left  Skin: Skin is warm and dry.  Psychiatric: She has a normal mood and affect. Her behavior is normal. Thought content normal.   Results for orders placed or performed during the hospital encounter of 04/03/17 (from the past 48 hour(s))  Ethanol     Status: None   Collection Time: 04/03/17 10:14 PM  Result Value Ref Range   Alcohol, Ethyl (B) <10 <10 mg/dL    Comment:        LOWEST DETECTABLE LIMIT FOR SERUM ALCOHOL IS 10 mg/dL FOR MEDICAL PURPOSES ONLY   CBC     Status: Abnormal   Collection Time: 04/03/17 10:14 PM  Result Value Ref Range   WBC 9.4 4.0 - 10.5 K/uL   RBC 4.01 3.87 - 5.11 MIL/uL   Hemoglobin 11.8 (L) 12.0 - 15.0 g/dL   HCT 36.3 36.0 - 46.0 %   MCV 90.5 78.0 - 100.0 fL   MCH 29.4 26.0 - 34.0 pg   MCHC 32.5 30.0 - 36.0  g/dL   RDW 13.6 11.5 - 15.5 %   Platelets 164 150 - 400 K/uL  Differential     Status: None   Collection Time: 04/03/17 10:14 PM  Result Value Ref Range   Neutrophils Relative % 67 %   Neutro Abs 6.4 1.7 - 7.7 K/uL   Lymphocytes Relative 20 %   Lymphs Abs 1.8 0.7 - 4.0 K/uL   Monocytes Relative 10 %   Monocytes Absolute 0.9 0.1 - 1.0 K/uL   Eosinophils Relative 2 %   Eosinophils Absolute 0.2 0.0 - 0.7 K/uL   Basophils Relative 1 %   Basophils Absolute 0.1 0.0 - 0.1 K/uL  Comprehensive metabolic panel     Status: Abnormal   Collection Time: 04/03/17 10:14 PM  Result Value Ref Range   Sodium 139 135 - 145 mmol/L   Potassium 3.6 3.5 - 5.1 mmol/L   Chloride 111 101 - 111 mmol/L   CO2 23 22 - 32 mmol/L   Glucose, Bld 109 (H) 65 - 99 mg/dL   BUN 23 (H) 6 - 20 mg/dL   Creatinine, Ser 0.77 0.44 - 1.00 mg/dL   Calcium 8.7 (L) 8.9 - 10.3 mg/dL   Total Protein 6.3 (  L) 6.5 - 8.1 g/dL   Albumin 3.3 (L) 3.5 - 5.0 g/dL   AST 20 15 - 41 U/L   ALT 20 14 - 54 U/L   Alkaline Phosphatase 74 38 - 126 U/L   Total Bilirubin 0.3 0.3 - 1.2 mg/dL   GFR calc non Af Amer >60 >60 mL/min   GFR calc Af Amer >60 >60 mL/min    Comment: (NOTE) The eGFR has been calculated using the CKD EPI equation. This calculation has not been validated in all clinical situations. eGFR's persistently <60 mL/min signify possible Chronic Kidney Disease.    Anion gap 5 5 - 15  Urine rapid drug screen (hosp performed)     Status: None   Collection Time: 04/03/17 10:14 PM  Result Value Ref Range   Opiates NONE DETECTED NONE DETECTED   Cocaine NONE DETECTED NONE DETECTED   Benzodiazepines NONE DETECTED NONE DETECTED   Amphetamines NONE DETECTED NONE DETECTED   Tetrahydrocannabinol NONE DETECTED NONE DETECTED   Barbiturates NONE DETECTED NONE DETECTED    Comment:        DRUG SCREEN FOR MEDICAL PURPOSES ONLY.  IF CONFIRMATION IS NEEDED FOR ANY PURPOSE, NOTIFY LAB WITHIN 5 DAYS.        LOWEST DETECTABLE LIMITS FOR  URINE DRUG SCREEN Drug Class       Cutoff (ng/mL) Amphetamine      1000 Barbiturate      200 Benzodiazepine   993 Tricyclics       716 Opiates          300 Cocaine          300 THC              50   Urinalysis, Routine w reflex microscopic     Status: Abnormal   Collection Time: 04/03/17 10:14 PM  Result Value Ref Range   Color, Urine YELLOW YELLOW   APPearance HAZY (A) CLEAR   Specific Gravity, Urine 1.023 1.005 - 1.030   pH 6.0 5.0 - 8.0   Glucose, UA NEGATIVE NEGATIVE mg/dL   Hgb urine dipstick NEGATIVE NEGATIVE   Bilirubin Urine NEGATIVE NEGATIVE   Ketones, ur NEGATIVE NEGATIVE mg/dL   Protein, ur NEGATIVE NEGATIVE mg/dL   Nitrite NEGATIVE NEGATIVE   Leukocytes, UA NEGATIVE NEGATIVE  I-stat troponin, ED     Status: None   Collection Time: 04/03/17 10:22 PM  Result Value Ref Range   Troponin i, poc 0.00 0.00 - 0.08 ng/mL   Comment 3            Comment: Due to the release kinetics of cTnI, a negative result within the first hours of the onset of symptoms does not rule out myocardial infarction with certainty. If myocardial infarction is still suspected, repeat the test at appropriate intervals.   I-Stat Chem 8, ED     Status: Abnormal   Collection Time: 04/03/17 10:23 PM  Result Value Ref Range   Sodium 143 135 - 145 mmol/L   Potassium 3.6 3.5 - 5.1 mmol/L   Chloride 108 101 - 111 mmol/L   BUN 23 (H) 6 - 20 mg/dL   Creatinine, Ser 0.70 0.44 - 1.00 mg/dL   Glucose, Bld 111 (H) 65 - 99 mg/dL   Calcium, Ion 1.21 1.15 - 1.40 mmol/L   TCO2 23 22 - 32 mmol/L   Hemoglobin 11.6 (L) 12.0 - 15.0 g/dL   HCT 34.0 (L) 36.0 - 46.0 %  Protime-INR     Status: None  Collection Time: 04/03/17 10:25 PM  Result Value Ref Range   Prothrombin Time 14.0 11.4 - 15.2 seconds   INR 1.08   APTT     Status: None   Collection Time: 04/03/17 10:25 PM  Result Value Ref Range   aPTT 28 24 - 36 seconds  Hemoglobin A1c     Status: None   Collection Time: 04/04/17  4:08 AM  Result Value  Ref Range   Hgb A1c MFr Bld 5.6 4.8 - 5.6 %    Comment: (NOTE) Pre diabetes:          5.7%-6.4% Diabetes:              >6.4% Glycemic control for   <7.0% adults with diabetes    Mean Plasma Glucose 114.02 mg/dL  Lipid panel     Status: Abnormal   Collection Time: 04/04/17  4:08 AM  Result Value Ref Range   Cholesterol 99 0 - 200 mg/dL   Triglycerides 38 <150 mg/dL   HDL 33 (L) >40 mg/dL   Total CHOL/HDL Ratio 3.0 RATIO   VLDL 8 0 - 40 mg/dL   LDL Cholesterol 58 0 - 99 mg/dL    Comment:        Total Cholesterol/HDL:CHD Risk Coronary Heart Disease Risk Table                     Men   Women  1/2 Average Risk   3.4   3.3  Average Risk       5.0   4.4  2 X Average Risk   9.6   7.1  3 X Average Risk  23.4   11.0        Use the calculated Patient Ratio above and the CHD Risk Table to determine the patient's CHD Risk.        ATP III CLASSIFICATION (LDL):  <100     mg/dL   Optimal  100-129  mg/dL   Near or Above                    Optimal  130-159  mg/dL   Borderline  160-189  mg/dL   High  >190     mg/dL   Very High   Glucose, capillary     Status: None   Collection Time: 04/04/17  6:32 AM  Result Value Ref Range   Glucose-Capillary 82 65 - 99 mg/dL   Comment 1 Notify RN    Comment 2 Document in Chart   Glucose, capillary     Status: Abnormal   Collection Time: 04/04/17 11:32 AM  Result Value Ref Range   Glucose-Capillary 104 (H) 65 - 99 mg/dL  Glucose, capillary     Status: Abnormal   Collection Time: 04/04/17  4:59 PM  Result Value Ref Range   Glucose-Capillary 107 (H) 65 - 99 mg/dL  Glucose, capillary     Status: None   Collection Time: 04/04/17  9:35 PM  Result Value Ref Range   Glucose-Capillary 91 65 - 99 mg/dL   Comment 1 Notify RN    Comment 2 Document in Chart   Glucose, capillary     Status: None   Collection Time: 04/05/17  7:25 AM  Result Value Ref Range   Glucose-Capillary 83 65 - 99 mg/dL   Dg Chest 2 View  Result Date: 04/03/2017 CLINICAL  DATA:  Code stroke, crackles on lung exam EXAM: CHEST  2 VIEW COMPARISON:  02/01/2016 FINDINGS: Low  lung volumes. No focal pulmonary infiltrate, consolidation or effusion. Cardiomediastinal silhouette within normal limits. Aortic atherosclerosis. No pneumothorax. Calcified breast implants. Stable mild wedge deformities of mid to upper thoracic vertebra. IMPRESSION: No active cardiopulmonary disease. Electronically Signed   By: Donavan Foil M.D.   On: 04/03/2017 23:46   Mr Angiogram Head Wo Contrast  Result Date: 04/04/2017 CLINICAL DATA:  Increasing RIGHT-sided weakness. Suspect stroke. History of stroke, trigeminal neuralgia, tremor, hypertension, hyperlipidemia. EXAM: MRI HEAD WITHOUT AND WITH CONTRAST MRA HEAD WITHOUT CONTRAST TECHNIQUE: Multiplanar, multiecho pulse sequences of the brain and surrounding structures were obtained without and with intravenous contrast. Angiographic images of the head were obtained using MRA technique without contrast. CONTRAST:  9 cc MultiHance COMPARISON:  CT HEAD April 03, 2017 at 2218 hours and MRI of the head February 01, 2016 FINDINGS: MRI HEAD FINDINGS INTRACRANIAL CONTENTS: 4 mm reduced diffusion LEFT parietal cortex at convexity, too small to characterize on ADC maps, though there is faint enhancement. Reduced diffusion in the LEFT parietal white matter with normalized ADC values. Chronic RIGHT parietal microhemorrhage. Small areas bifrontal and RIGHT parietal encephalomalacia. Confluent supratentorial white matter FLAIR T2 hyperintensities. Patchy pontine white matter FLAIR T2 hyperintensities. Early LEFT cerebral peduncle wallerian degeneration. Old LEFT posterior limb of the internal capsule lacunar infarct. Old RIGHT basal ganglia lacunar infarct. Prominent basal ganglia and thalamus perivascular spaces associated with chronic small vessel ischemic disease. No midline shift, mass effect or masses. Moderate to severe parenchymal brain volume loss. No  hydrocephalus. No abnormal parenchymal or extra-axial enhancement. No extra-axial fluid collections or masses. VASCULAR: See below. SKULL AND UPPER CERVICAL SPINE: No abnormal sellar expansion. No suspicious calvarial bone marrow signal. Craniocervical junction maintained. SINUSES/ORBITS: The mastoid air-cells and included paranasal sinuses are well-aerated.The included ocular globes and orbital contents are non-suspicious. Status post bilateral ocular lens implants. OTHER: None. MRA HEAD FINDINGS ANTERIOR CIRCULATION: Normal flow related enhancement of the included cervical, petrous, cavernous and supraclinoid internal carotid arteries. RIGHT posterior communicating artery origin infundibulum. Patent anterior communicating artery. Patent anterior and middle cerebral arteries, including distal segments. No large vessel occlusion, flow limiting stenosis, aneurysm. POSTERIOR CIRCULATION: Codominant vertebral artery's. Basilar artery is patent, with normal flow related enhancement of the main branch vessels. Fenestrated proximal basilar artery. Patent posterior cerebral arteries. Mild stenosis bilateral P2 segments compatible with atherosclerosis. No large vessel occlusion, flow limiting stenosis,  aneurysm. ANATOMIC VARIANTS: None. Source images and MIP images were reviewed. IMPRESSION: MRI HEAD: 1. Subacute enhancing 4 mm LEFT parietal infarct. Subacute small LEFT parietal white matter infarct. 2. Small bifrontal and RIGHT parietal infarcts in MCA territories. 3. Severe chronic small vessel ischemic disease. Old lacunar infarcts. MRA HEAD: 1. No emergent large vessel occlusion or flow limiting stenosis. Electronically Signed   By: Elon Alas M.D.   On: 04/04/2017 01:37   Mr Jeri Cos And Wo Contrast  Result Date: 04/04/2017 CLINICAL DATA:  Increasing RIGHT-sided weakness. Suspect stroke. History of stroke, trigeminal neuralgia, tremor, hypertension, hyperlipidemia. EXAM: MRI HEAD WITHOUT AND WITH CONTRAST  MRA HEAD WITHOUT CONTRAST TECHNIQUE: Multiplanar, multiecho pulse sequences of the brain and surrounding structures were obtained without and with intravenous contrast. Angiographic images of the head were obtained using MRA technique without contrast. CONTRAST:  9 cc MultiHance COMPARISON:  CT HEAD April 03, 2017 at 2218 hours and MRI of the head February 01, 2016 FINDINGS: MRI HEAD FINDINGS INTRACRANIAL CONTENTS: 4 mm reduced diffusion LEFT parietal cortex at convexity, too small to characterize on ADC maps, though there is  faint enhancement. Reduced diffusion in the LEFT parietal white matter with normalized ADC values. Chronic RIGHT parietal microhemorrhage. Small areas bifrontal and RIGHT parietal encephalomalacia. Confluent supratentorial white matter FLAIR T2 hyperintensities. Patchy pontine white matter FLAIR T2 hyperintensities. Early LEFT cerebral peduncle wallerian degeneration. Old LEFT posterior limb of the internal capsule lacunar infarct. Old RIGHT basal ganglia lacunar infarct. Prominent basal ganglia and thalamus perivascular spaces associated with chronic small vessel ischemic disease. No midline shift, mass effect or masses. Moderate to severe parenchymal brain volume loss. No hydrocephalus. No abnormal parenchymal or extra-axial enhancement. No extra-axial fluid collections or masses. VASCULAR: See below. SKULL AND UPPER CERVICAL SPINE: No abnormal sellar expansion. No suspicious calvarial bone marrow signal. Craniocervical junction maintained. SINUSES/ORBITS: The mastoid air-cells and included paranasal sinuses are well-aerated.The included ocular globes and orbital contents are non-suspicious. Status post bilateral ocular lens implants. OTHER: None. MRA HEAD FINDINGS ANTERIOR CIRCULATION: Normal flow related enhancement of the included cervical, petrous, cavernous and supraclinoid internal carotid arteries. RIGHT posterior communicating artery origin infundibulum. Patent anterior  communicating artery. Patent anterior and middle cerebral arteries, including distal segments. No large vessel occlusion, flow limiting stenosis, aneurysm. POSTERIOR CIRCULATION: Codominant vertebral artery's. Basilar artery is patent, with normal flow related enhancement of the main branch vessels. Fenestrated proximal basilar artery. Patent posterior cerebral arteries. Mild stenosis bilateral P2 segments compatible with atherosclerosis. No large vessel occlusion, flow limiting stenosis,  aneurysm. ANATOMIC VARIANTS: None. Source images and MIP images were reviewed. IMPRESSION: MRI HEAD: 1. Subacute enhancing 4 mm LEFT parietal infarct. Subacute small LEFT parietal white matter infarct. 2. Small bifrontal and RIGHT parietal infarcts in MCA territories. 3. Severe chronic small vessel ischemic disease. Old lacunar infarcts. MRA HEAD: 1. No emergent large vessel occlusion or flow limiting stenosis. Electronically Signed   By: Elon Alas M.D.   On: 04/04/2017 01:37   Ct Head Code Stroke Wo Contrast  Addendum Date: 04/04/2017   ADDENDUM REPORT: 04/04/2017 01:06 ADDENDUM: Critical Value/emergent results were called by telephone at the time of interpretation on 04/04/2017 at 1:05 am to Dr. Cheral Marker, Neurology, who verbally acknowledged these results. Electronically Signed   By: Elon Alas M.D.   On: 04/04/2017 01:06   Result Date: 04/04/2017 CLINICAL DATA:  Code stroke.  RIGHT-sided weakness. EXAM: CT HEAD WITHOUT CONTRAST TECHNIQUE: Contiguous axial images were obtained from the base of the skull through the vertex without intravenous contrast. COMPARISON:  CT HEAD February 01, 2016 and MRI of the head February 01, 2016 FINDINGS: BRAIN: No intraparenchymal hemorrhage, mass effect nor midline shift. Confluent similar supratentorial and pontine white matter hypodensities. No acute large vascular territory infarcts. Small areas RIGHT frontoparietal encephalomalacia. Old LEFT posterior limb of in the  internal capsule small infarct. Faint hypodensities bilateral basal ganglia and thalamus a responding to perivascular spaces associated with chronic small vessel ischemic disease. No abnormal extra-axial fluid collections. Basal cisterns are patent. VASCULAR: Moderate calcific atherosclerosis of the carotid siphons. SKULL: No skull fracture. No significant scalp soft tissue swelling. SINUSES/ORBITS: The mastoid air-cells and included paranasal sinuses are well-aerated.The included ocular globes and orbital contents are non-suspicious. OTHER: None. ASPECTS [facility] Stroke Program Early CT Score) - Ganglionic level infarction (caudate, lentiform nuclei, internal capsule, insula, M1-M3 cortex): 7 - Supraganglionic infarction (M4-M6 cortex): 3 Total score (0-10 with 10 being normal): 10 IMPRESSION: 1. No acute intracranial process. 2. Moderate to severe chronic small vessel ischemic disease and old lacunar infarcts. 3. Small areas RIGHT frontoparietal encephalomalacia most compatible old RIGHT MCA territory infarcts. 4.  ASPECTS is 10. 5. Dr. Cheral Marker, Neurology paged via [facility] paging system on 04/03/2017 at 10:28 pm, awaiting return call. Electronically Signed: By: Elon Alas M.D. On: 04/03/2017 22:32       Medical Problem List and Plan: 1.  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  DVT Prophylaxis/Anticoagulation: Subcutaneous Lovenox. Monitor platelet counts in any signs of bleeding 3. Pain Management: Tylenol as needed 4. Mood: Prozac 20 mg daily 5. Neuropsych: This patient is capable of making decisions on her own behalf. 6. Skin/Wound Care: Routine skin checks 7. Fluids/Electrolytes/Nutrition: Routine I&O's with follow-up chemistries 8.Pre- diabetes. Hemoglobin A1c 5.6.SSI 9. Hypertension. Patient on no antihypertensive medications prior to  admission. 10. Diastolic congestive heart failure. Monitor for any signs of fluid overload 11. Hyperlipidemia. Lipitor 12. Overactive bladder. Ditropan 5 mg daily. Check PVRs 3 13. Trigeminal neuralgia. Topamax 50 mg twice a day  Post Admission Physician Evaluation: 1. Preadmission assessment reviewed and changes made below. 2. Functional deficits secondary  to bilateral infarcts. 3. Patient is admitted to receive collaborative, interdisciplinary care between the physiatrist, rehab nursing staff, and therapy team. 4. Patient's level of medical complexity and substantial therapy needs in context of that medical necessity cannot be provided at a lesser intensity of care such as a SNF. 5. Patient has experienced substantial functional loss from his/her baseline which was documented above under the "Functional History" and "Functional Status" headings.  Judging by the patient's diagnosis, physical exam, and functional history, the patient has potential for functional progress which will result in measurable gains while on inpatient rehab.  These gains will be of substantial and practical use upon discharge  in facilitating mobility and self-care at the household level. 47. Physiatrist will provide 24 hour management of medical needs as well as oversight of the therapy plan/treatment and provide guidance as appropriate regarding the interaction of the two. 7. 24 hour rehab nursing will assist with safety, disease management and patient education  and help integrate therapy concepts, techniques,education, etc. 8. PT will assess and treat for/with: Lower extremity strength, range of motion, stamina, balance, functional mobility, safety, adaptive techniques and equipment, coping skills, pain control, education.   Goals are: Supervision. 9. OT will assess and treat for/with: ADL's, functional mobility, safety, upper extremity strength, adaptive techniques and equipment, ego support, and community  reintegration.   Goals are: Supervision/Min A. Therapy may proceed with showering this patient. 10. Case Management and Social Worker will assess and treat for psychological issues and discharge planning. 11. Team conference will be held weekly to assess progress toward goals and to determine barriers to discharge. 12. Patient will receive at least 3 hours of therapy per day at least 5 days per week. 13. ELOS: 10-15 days.       14. Prognosis:  good  Delice Lesch, MD, ABPMR Lauraine Rinne J., PA-C 04/05/2017

## 2017-04-05 NOTE — Progress Notes (Signed)
Physical Therapy Treatment Patient Details Name: Kelly [name] MRN: [id number] DOB: 08-13-1927 Today's Date: 04/05/2017    History of Present Illness Pt is an 83 yo female admitted through the ED on 03/03/17 with worsening right sided weakness. Pt has a history of CVA in 07/2015 resulting in right sided weakness with dysphagia and dysphonia. PMH significant for R sided CVA, HLD, HTN, OP, Trigeminal neuralgia.     PT Comments    Assisted pt up to chair to eat lunch. Pt con't to require min/modA for mobility due to R sided weakness and impaired balance. Assisted pt with donning of pants, sock, and shoes, required maxA but was able to doff socks in supine in bed. Pt very motivated with good home set up and support. Recommend CIR for aggressive rehab to achieve safe mod I level of function to return home with spouse.   Follow Up Recommendations  CIR     Equipment Recommendations  None recommended by PT    Recommendations for Other Services       Precautions / Restrictions Precautions Precautions: Fall Precaution Comments: R sided weakness Restrictions Weight Bearing Restrictions: No    Mobility  Bed Mobility Overal bed mobility: Needs Assistance Bed Mobility: Supine to Sit     Supine to sit: Min assist;HOB elevated Sit to supine: Min assist   General bed mobility comments: minA for trunk elevation and to scoot to EOB  Transfers Overall transfer level: Needs assistance Equipment used: Rolling walker (2 wheeled) Transfers: Sit to/from Stand Sit to Stand: Min assist;Mod assist         General transfer comment: minA to scoot forward to place feet on floor, minA to block feet from sliding, modA to power up to full standing, pt with retropulsion and leaning back on bed with back of legs  Ambulation/Gait Ambulation/Gait assistance: Min assist Ambulation Distance (Feet): 20 Feet(around foot of bed) Assistive device: Rolling walker (2 wheeled) Gait Pattern/deviations:  Step-to pattern;Decreased step length - left;Decreased stance time - right;Decreased stride length;Trunk flexed;Narrow base of support Gait velocity: decreased Gait velocity interpretation: Below normal speed for age/gender General Gait Details: slow gait with forward trunk lean due to kyphotic posture. Minimal foot drop noted on RLE which is compensated with increased hip flexion. Cues for positioning within RW.    Stairs            Wheelchair Mobility    Modified Rankin (Stroke Patients Only) Modified Rankin (Stroke Patients Only) Pre-Morbid Rankin Score: Moderately severe disability Modified Rankin: Moderately severe disability     Balance Overall balance assessment: Needs assistance Sitting-balance support: No upper extremity supported;Feet supported Sitting balance-Leahy Scale: Fair     Standing balance support: Bilateral upper extremity supported Standing balance-Leahy Scale: Poor Standing balance comment: reliant on RW for stability in standing                            Cognition Arousal/Alertness: Awake/alert Behavior During Therapy: WFL for tasks assessed/performed Overall Cognitive Status: Within Functional Limits for tasks assessed                                        Exercises      General Comments General comments (skin integrity, edema, etc.): pt able to doff bilat sock in supine in bed but required max assist to don socks and pants and shoes  Pertinent Vitals/Pain Pain Assessment: No/denies pain    Home Living                 Additional Comments: was receiving OP therapy until June    Prior Function Level of Independence: Independent with assistive device(s)(Mod I with RW, superivsion with adls by spouse)          PT Goals (current goals can now be found in the care plan section) Progress towards PT goals: Progressing toward goals    Frequency    Min 4X/week      PT Plan Discharge plan needs  to be updated    Co-evaluation              AM-PAC PT "6 Clicks" Daily Activity  Outcome Measure  Difficulty turning over in bed (including adjusting bedclothes, sheets and blankets)?: Unable Difficulty moving from lying on back to sitting on the side of the bed? : Unable Difficulty sitting down on and standing up from a chair with arms (e.g., wheelchair, bedside commode, etc,.)?: Unable Help needed moving to and from a bed to chair (including a wheelchair)?: A Lot Help needed walking in hospital room?: A Lot Help needed climbing 3-5 steps with a railing? : Total 6 Click Score: 8    End of Session Equipment Utilized During Treatment: Gait belt Activity Tolerance: Patient tolerated treatment well Patient left: in chair;with call bell/phone within reach Nurse Communication: Mobility status PT Visit Diagnosis: Unsteadiness on feet (R26.81);Muscle weakness (generalized) (M62.81);Difficulty in walking, not elsewhere classified (R26.2);Hemiplegia and hemiparesis Hemiplegia - Right/Left: Right Hemiplegia - dominant/non-dominant: Dominant Hemiplegia - caused by: Cerebral infarction     Time: [id number] PT Time Calculation (min) (ACUTE ONLY): 21 min  Charges:  $Gait Training: 8-22 mins                    G Codes:       Kelly ShockAshly Jurell [name], PT, DPT Pager #: [phone] Office #: [phone]    Kelly [name] 04/05/2017, 12:18 PM

## 2017-04-05 NOTE — IPOC Note (Signed)
Overall Plan of Care [facility](IPOC) Patient Details Name: Kelly HumphreysBennie H Seyler MRN: [id number] DOB: 11/23/1927  Admitting Diagnosis: Bilateral CVA  Hospital Problems: Active Problems:   Acute bilateral cerebral infarction in a watershed distribution   Anemia of chronic disease   Hypoalbuminemia due to protein-calorie malnutrition [facility](HCC)     Functional Problem List: Nursing Bladder, Bowel, Edema, Medication Management, Motor, Nutrition, Pain, Perception, Safety, Skin Integrity  PT Balance, Endurance, Safety, Motor, Edema  OT Balance, Cognition, Endurance, Motor  SLP Cognition, Linguistic  TR         Basic ADL's: OT Grooming, Bathing, Dressing, Toileting     Advanced  ADL's: OT       Transfers: PT Bed Mobility, Bed to Chair, Car, Occupational psychologisturniture  OT Toilet, Research scientist (life sciences)Tub/Shower     Locomotion: PT Ambulation, Psychologist, prison and probation servicesWheelchair Mobility, Stairs     Additional Impairments: OT Fuctional Use of Upper Extremity  SLP Communication, Social Cognition expression Memory  TR      Anticipated Outcomes Item Anticipated Outcome  Self Feeding    Swallowing      Basic self-care  Marketing executiveupervision  Toileting  Supervision   Bathroom Transfers Supervision  Bowel/Bladder  min assist  Transfers  supervision  Locomotion  supervision  Communication  mod I   Cognition  supervision   Pain  less<2  Safety/Judgment  min assist   Therapy Plan: PT Intensity: Minimum of 1-2 x/day ,45 to 90 minutes PT Frequency: 5 out of 7 days PT Duration Estimated Length of Stay: 10 days OT Intensity: Minimum of 1-2 x/day, 45 to 90 minutes OT Frequency: 5 out of 7 days OT Duration/Estimated Length of Stay: 10-14 days SLP Intensity: Minumum of 1-2 x/day, 30 to 90 minutes SLP Frequency: 1 to 3 out of 7 days SLP Duration/Estimated Length of Stay: 7-10 days     Team Interventions: Nursing Interventions Patient/Family Education, Bowel Management, Pain Management, Bladder Management, Disease Management/Prevention, Medication  Management, Skin Care/Wound Management, Discharge Planning, Psychosocial Support  PT interventions Ambulation/gait training, Community reintegration, DME/adaptive equipment instruction, Neuromuscular re-education, Stair training, UE/LE Strength taining/ROM, Wheelchair propulsion/positioning, UE/LE Coordination activities, Therapeutic Activities, Pain management, Discharge planning, Warden/rangerBalance/vestibular training, Functional electrical stimulation, Cognitive remediation/compensation, Functional mobility training, Patient/family education, Splinting/orthotics, Therapeutic Exercise  OT Interventions Balance/vestibular training, Cognitive remediation/compensation, Discharge planning, Disease mangement/prevention, DME/adaptive equipment instruction, Functional electrical stimulation, Functional mobility training, Neuromuscular re-education, Pain management, Patient/family education, Psychosocial support, Self Care/advanced ADL retraining, Skin care/wound managment, Therapeutic Activities, Splinting/orthotics, Therapeutic Exercise, UE/LE Strength taining/ROM, UE/LE Coordination activities  SLP Interventions Cognitive remediation/compensation, Cueing hierarchy, Patient/family education, Internal/external aids, Speech/Language facilitation  TR Interventions    SW/CM Interventions Discharge Planning, Psychosocial Support, Patient/Family Education   Barriers to Discharge MD  Medical stability  Nursing      PT Inaccessible home environment 2 steps to enter without rails  OT      SLP      SW       Team Discharge Planning: Destination: PT-Home ,OT- Home , SLP-Home Projected Follow-up: PT-Home health PT, Outpatient PT, OT-  Home health OT, 24 hour supervision/assistance, SLP-Other (comment)(TBD) Projected Equipment Needs: PT-To be determined, OT- None recommended by OT, SLP-None recommended by SLP Equipment Details: PT- , OT-  Patient/family involved in discharge planning: PT- Patient, Family  member/caregiver,  OT-Patient, SLP-Patient  MD ELOS: 8-12 days Medical Rehab Prognosis:  Good Assessment: 83 y.o.right handed femalewith history of hypertension, diastolic congestive heart failure, prediabetes, left MCA with right-sided residual weakness maintained on aspirin and received inpatient rehabilitation services September 2017 and discharged to  home at a supervision level ambulating 120 feet. Presented 04/03/2017 with increasing right sided weakness. Cranial CTreviewed, showingno acute intracranial process. Patient did not receive TPA. MRI revealed subacute enhancing 4 mm left parietal infarct. Subacute small left parietal white matter infarct. Small bifrontal and right parietal infarctsinMCA territories. MRA with no emergent large vessel occlusion or stenosis. Carotid Dopplers in no ICA stenosis. Neurology consulted present maintained on Plavix for CVA prophylaxis. Patient with resulting functional deficits with mobility, transfers, self-care.  Will set goals for Supervision for most tasks with PT/OT.   See Team Conference Notes for weekly updates to the plan of care

## 2017-04-05 NOTE — PMR Pre-admission (Signed)
PMR Admission Coordinator Pre-Admission Assessment  Patient: Kelly HumphreysBennie H [name] is an 83 y.o., female MRN: [id number] DOB: [birth year] Height: 5' (152.4 cm) Weight: 44.2 kg (97 lb 8 oz)              Insurance Information HMO:    PPO:      PCP:   IPA:      80/20: yes     OTHER: no HMO PRIMARY: Medicare a and b      Policy#: [id number]      Subscriber: pt Benefits:  Phone #: passport one online     Name: 04/05/2017 Eff. Date:  11/23/94     Deduct: $1340      Out of Pocket Max: none      Life Max: none CIR: 100%      SNF: 20 full days Outpatient: 80%     Co-Pay: 20% Home Health: 100%      Co-Pay: none DME: 80%     Co-Pay: 20% Providers: pt choice  [place] [facility]: [facility]      Policy#: [id number]      Subscriber: pt  Medicaid Application Date:       Case Manager:  Disability Application Date:       Case Worker:   Emergency Contact Information Contact Information    Name Relation Home Work Mobile   [name],[name] Spouse [phone]  [phone]   [facility],[name] Daughter [phone]       Current Medical History  Patient Admitting Diagnosis: left CVA  History of Present Illness: HPI: Kelly HarpBennie H Rallsis a 83 y.o.right handed femalewith history of documented hypertension, diastolic congestive heart failure, prediabetes, left MCA with right-sided residual weakness maintained on aspirin and received inpatient rehabilitation services September 2017 .Presented 04/03/2017 with increasing right sided weakness. Cranial CTreviewed, showingno acute intracranial process. Patient did not receive TPA. MRI revealed subacute enhancing 4 mm left parietal infarct. Subacute small left parietal white matter infarct. Small bifrontal and right parietal infarctsinMCA territories. MRA with no emergent large vessel occlusion or stenosis. Carotid Dopplers in no ICA stenosis. Echocardiogram pending. Neurology consulted present maintained on Plavix for CVA prophylaxis. Subcutaneous Lovenox for DVT prophylaxis.  Maintained on a regular consistency diet.   Total: 3  NIHSS    Past Medical History  Past Medical History:  Diagnosis Date  . Acute blood loss anemia   . Adjustment disorder with depressed mood   . CVA (cerebral vascular accident) (HCC) 02/03/2016   Linear infarct within the posterior limb of left internal capsule  . Diastolic CHF (HCC)   . Dysphagia   . Dysphonia 02/20/2013  . Essential and other specified forms of tremor 02/20/2013  . HLD (hyperlipidemia)   . HTN (hypertension)   . OAB (overactive bladder)   . Osteoporosis   . Patient receiving subcutaneous heparin    For DVT prophylaxis 9/17  . Prediabetes   . Psoriasis   . Slow transit constipation   . Stroke (cerebrum) (HCC) 02/01/2016  . Trigeminal neuralgia 02/20/2013    Family History  family history includes Arthritis in her brother; Osteoporosis in her maternal grandmother.  Prior Rehab/Hospitalizations:  Has the patient had major surgery during 100 days prior to admission? No   CIR 02/03/16 until 02/20/17 [facility] until November 2017 and [facility] and then Out pt rehab until 10/2016.  Current Medications   Current Facility-Administered Medications:  .  acetaminophen (TYLENOL) tablet 650 mg, 650 mg, Oral, Q4H PRN **OR** acetaminophen (TYLENOL) solution  650 mg, 650 mg, Per Tube, Q4H PRN **OR** acetaminophen (TYLENOL) suppository 650 mg, 650 mg, Rectal, Q4H PRN, [name], [name], MD .  atorvastatin (LIPITOR) tablet 40 mg, 40 mg, Oral, q1800, [name], [name], MD, 40 mg at 04/04/17 1742 .  clopidogrel (PLAVIX) tablet 75 mg, 75 mg, Oral, Daily, Rolly SalterPatel, [name], MD, 75 mg at 04/05/17 1205 .  enoxaparin (LOVENOX) injection 30 mg, 30 mg, Subcutaneous, Daily, [name], [name], MD, 30 mg at 04/05/17 1204 .  FLUoxetine (PROZAC) capsule 20 mg, 20 mg, Oral, Daily, [name], [name], MD, 20 mg at 04/05/17 1206 .  hydrALAZINE (APRESOLINE) injection 10 mg, 10 mg, Intravenous, Q8H PRN, [name],  [name], MD .  insulin aspart (novoLOG) injection 0-15 Units, 0-15 Units, Subcutaneous, TID WC, [name], [name], MD .  oxybutynin [facility](DITROPAN) tablet 5 mg, 5 mg, Oral, QHS, [name], [name], MD, 5 mg at 04/04/17 2209 .  senna-docusate (Senokot-S) tablet 1 tablet, 1 tablet, Oral, QHS PRN, [name], [name], MD .  topiramate (TOPAMAX) tablet 50 mg, 50 mg, Oral, BID, [name], [name], MD, 50 mg at 04/05/17 1205  Patients Current Diet: Diet heart healthy/carb modified Room service appropriate? Yes; Fluid consistency: Thin  Precautions / Restrictions Precautions Precautions: Fall Precaution Comments: R sided weakness Restrictions Weight Bearing Restrictions: No   Has the patient had 2 or more falls or a fall with injury in the past year?No  Fell four weeks ago  Prior Activity Level Limited Community (1-2x/wk): Mod I with RW; supervision for adls by spouse  Journalist, newspaperHome Assistive Devices / Equipment Home Assistive Devices/Equipment: Environmental consultantWalker (specify type) Home Equipment: Kelly HumphreysWalker - 2 wheels, Bedside commode, Tub bench, Walker - 4 wheels, Wheelchair - manual, Grab bars - toilet, Grab bars - tub/shower, Hand held shower head, Transport chair  Prior Device Use: Indicate devices/aids used by the patient prior to current illness, exacerbation or injury? Walker  Prior Functional Level Prior Function Level of Independence: Independent with assistive device(s)(Mod I with RW, superivsion with adls by spouse) Gait / Transfers Assistance Needed: uses RW for gait throughout the home ADL's / Homemaking Assistance Needed: Husband assists with ADLS and IADLS. Pt reports he sets out her clothes and she is able to manage to put them on herself.  Comments: husband assists with all activities except for a cleaning person who comes every other week.   Self Care: Did the patient need help bathing, dressing, using the toilet or eating?  Needed some help  Indoor Mobility: Did the patient need assistance with walking  from room to room (with or without device)? Independent with RW  Stairs: Did the patient need assistance with internal or external stairs (with or without device)? Needed some help  Functional Cognition: Did the patient need help planning regular tasks such as shopping or remembering to take medications? Needed some help  Current Functional Level Cognition  Overall Cognitive Status: Within Functional Limits for tasks assessed Orientation Level: Oriented X4    Extremity Assessment (includes Sensation/Coordination)  Upper Extremity Assessment: RUE deficits/detail RUE Deficits / Details: RUE with tendency to flex and turn inward forming an indwelled fist at her chest. Pt able to open her hand and extend elbow. Decreased FM skills and strength; pt and husband reporting RUE weaker and less fucntional than before admission RUE Coordination: decreased fine motor, decreased gross motor  Lower Extremity Assessment: Defer to PT evaluation RLE Deficits / Details: weakness noted in RLE at least 3/5 grossly LLE Deficits / Details: weakness noted in  LLE. At least 3+/5 grossly    ADLs  Overall ADL's : Needs assistance/impaired Eating/Feeding: Minimal assistance, Sitting Grooming: Maximal assistance, Standing, Cueing for sequencing, Oral care Grooming Details (indicate cue type and reason): Pt requiring physical support to stabilize while performing bilateral tasks at sink. Pt with posterior lean during dynamic movements. Pt demonstrating poor funcitonal use of RUE/hand. Pt attempting to use R hand for holding tooth bursh and tooth paste; required hand over hand A to maintain grasp in R hand.  Upper Body Bathing: Minimal assistance, Sitting Lower Body Bathing: Maximal assistance, Sit to/from stand Upper Body Dressing : Minimal assistance, Sitting Lower Body Dressing: Maximal assistance, Sit to/from stand Lower Body Dressing Details (indicate cue type and reason): Pt able to doff shoes at EOB by brining  ankel to knee. Would require Max A for standing balance during dynamic movement due to posterior lean Toilet Transfer: Minimal assistance, Ambulation, RW Functional mobility during ADLs: Minimal assistance, Rolling walker General ADL Comments: Pt demonstrating decreased fucntional performance due to decreased balance and functional use of RUE. Pt highly motivated and husband is very supprotive    Mobility  Overal bed mobility: Needs Assistance Bed Mobility: Supine to Sit Supine to sit: Min assist, HOB elevated Sit to supine: Min assist General bed mobility comments: minA for trunk elevation and to scoot to EOB    Transfers  Overall transfer level: Needs assistance Equipment used: Rolling walker (2 wheeled) Transfers: Sit to/from Stand Sit to Stand: Min assist, Mod assist General transfer comment: minA to scoot forward to place feet on floor, minA to block feet from sliding, modA to power up to full standing, pt with retropulsion and leaning back on bed with back of legs    Ambulation / Gait / Stairs / Wheelchair Mobility  Ambulation/Gait Ambulation/Gait assistance: Architect (Feet): 20 Feet(around foot of bed) Assistive device: Rolling walker (2 wheeled) Gait Pattern/deviations: Step-to pattern, Decreased step length - left, Decreased stance time - right, Decreased stride length, Trunk flexed, Narrow base of support General Gait Details: slow gait with forward trunk lean due to kyphotic posture. Minimal foot drop noted on RLE which is compensated with increased hip flexion. Cues for positioning within RW.  Gait velocity: decreased Gait velocity interpretation: Below normal speed for age/gender    Posture / Balance Dynamic Sitting Balance Sitting balance - Comments: Able to doff shoes at EOB without LOB Balance Overall balance assessment: Needs assistance Sitting-balance support: No upper extremity supported, Feet supported Sitting balance-Leahy Scale:  Fair Sitting balance - Comments: Able to doff shoes at EOB without LOB Standing balance support: Bilateral upper extremity supported Standing balance-Leahy Scale: Poor Standing balance comment: reliant on RW for stability in standing    Special needs/care consideration BiPAP/CPAP  N/a CPM  N/a Continuous Drip IV  N/a Dialysis  N/a Life Vest  N/a Oxygen  N/a Special Bed injury prevention bundle recommended Trach Size  N/a Wound Vac n/a Skin ecchymosis BUE; old sacral abrasion right and left buttocks noted Bowel mgmt: no LBM documented Bladder mgmt: continent Diabetic mgmt  Hgb A1c 5.6   Previous Home Environment Living Arrangements: Spouse/significant other  Lives With: Spouse(of 12 1/2 years) Available Help at Discharge: Family, Available 24 hours/day Type of Home: House Home Layout: Two level, Able to live on main level with bedroom/bathroom Home Access: Stairs to enter [employer] of Steps: 2 Bathroom Shower/Tub: Tub/shower unit, Engineer, building services: Standard Bathroom Accessibility: Yes How Accessible: Accessible via walker Home Care Services: (no HH  since 07/2016) Additional Comments: was receiving OP therapy until June  Discharge Living Setting Plans for Discharge Living Setting: Patient's home, Lives with (comment)(spouse) Type of Home at Discharge: House Discharge Home Layout: Two level, Able to live on main level with bedroom/bathroom Discharge Home Access: Stairs to enter Entrance Stairs-Rails: Right, Left, Can reach both Entrance Stairs-Number of Steps: 2 Discharge Bathroom Shower/Tub: Tub/shower unit, Curtain Discharge Bathroom Toilet: Standard Discharge Bathroom Accessibility: Yes How Accessible: Accessible via walker Does the patient have any problems obtaining your medications?: No  Social/Family/Support Systems Patient Roles: Spouse, Parent(pt's daughter lives next door) Contact Information: [name] "[name]" Anticipated Caregiver: spouse,  "[name]" 38 years old and self sufficient Anticipated Caregiver's Contact Information: see above Ability/Limitations of Caregiver: no limitations Caregiver Availability: 24/7 Discharge Plan Discussed with Primary Caregiver: Yes Is Caregiver In Agreement with Plan?: Yes Does Caregiver/Family have Issues with Lodging/Transportation while Pt is in Rehab?: No  Spouse is 53 years old. [name] lives next door to couple. Spouse's daughter is here visiting from the [place] 9/18 until December 2018.  Goals/Additional Needs Patient/Family Goal for Rehab: supervision PT, supervision to min assist OT, supervision SLP Expected length of stay: ELOS 10- 14 days Special Service Needs: high low bed protocol; injury prevention bundle recommended Pt/Family Agrees to Admission and willing to participate: Yes Program Orientation Provided & Reviewed with Pt/Caregiver Including Roles  & Responsibilities: Yes  Decrease burden of Care through IP rehab admission: n/a  Possible need for SNF placement upon discharge: not expected  Patient Condition: This patient's condition remains as documented in the consult dated 04/05/2017, in which the Rehabilitation Physician determined and documented that the patient's condition is appropriate for intensive rehabilitative care in an inpatient rehabilitation facility. Will admit to inpatient rehab today.  Preadmission Screen Completed By:  Clois Dupes, 04/05/2017 12:21 PM ______________________________________________________________________   Discussed status with Dr. Allena Katz on 04/05/2017 at  1220  and received telephone approval for admission today.  Admission Coordinator:  Clois Dupes, time 1610 Date 04/05/2017

## 2017-04-05 NOTE — Progress Notes (Signed)
Patient and family were informed about rehab process including patient safety plan and rehab booklet. 

## 2017-04-05 NOTE — Progress Notes (Signed)
PT Cancellation Note  Patient Details Name: Kelly HumphreysBennie H [name] MRN: [id number] DOB: 1927/10/13   Cancelled Treatment:    Reason Eval/Treat Not Completed: Patient at procedure or test/unavailable. [facility] to return as able to complete treatment.    [name] [name] 04/05/2017, 10:41 AM  [name], PT, DPT Pager #: [phone] Office #: [phone]

## 2017-04-05 NOTE — Progress Notes (Signed)
Patient transferred to rehab. Comfortable, Alert and oriented, not in distress. Report given.

## 2017-04-05 NOTE — Discharge Summary (Signed)
[facility] Discharge Summary   Patient: Kelly [name]:[id number]   PCP: Gaspar Garbe, MD DOB: Aug 31, 1927   Date of admission: 04/03/2017   Date of discharge:  04/05/2017    Discharge Diagnoses:  Principal Problem:   Stroke (cerebrum) [facility]) Active Problems:   Hyperlipidemia   Essential hypertension   Prediabetes   Overactive bladder   Benign essential HTN   Chronic diastolic heart failure (HCC)   Right spastic hemiparesis (HCC)   Admitted From: home Disposition:  CIR  Recommendations for Outpatient Follow-up:  1. Please follow up with PCP in 1 week 2. Please follow up with neurology as recommended   Follow-up Information    Micki Riley, MD. Schedule an appointment as soon as possible for a visit in 6 week(s).   Specialties:  Neurology, Radiology Contact information: [address] [place] [address] [phone]        Tisovec, Adelfa Koh, MD. Schedule an appointment as soon as possible for a visit in 1 week(s).   Specialty:  Internal Medicine Contact information: [address] [place] [address] [phone]          Diet recommendation: cardiac diet  Activity: The patient is advised to gradually reintroduce usual activities.  Discharge Condition: good  Code Status: full code  History of present illness: As per the H and P dictated on admission, "Kelly [name] is a 83 y.o. female with past medical history significant for stroke with right-sided deficits, heart failure, hypertension and prediabetes presents emergency room with chief complaint of worsening right-sided weakness.  Patient states that she was in her normal state of health today.  Said acute onset of right-sided weakness.  This improved and turned into dense right upper extremity weakness.  Patient states that during her acute phase of her weakness she had trouble ambulating.  EMS activated.  Patient does take full dose aspirin daily.  "  Hospital Course:   Summary of her active problems in the hospital is as following. 1.  Subacute CVA. Multiple bilateral infarcts, most likely embolic. Carotid Doppler and echocardiogram pending PT recommends SNF OT recommends CIR. Speech therapy consult pending. Passed swallowing eval. Continue cardiac diet. Change aspirin to Plavix. Patient is at high fall risk with advanced age and therefore anticoagulation would not be recommended ideally.  Therefore TEE and loop recorder is also not recommended right now. Continue Lipitor 40 mg.  2.  Dyslipidemia. Continue statin.  3.  Depression. Continuing Prozac.  4.  Overactive bladder. Continue Ditropan.  All other chronic medical condition were stable during the hospitalization.  Patient was seen by physical therapy, who recommended CIR, which was arranged by Child psychotherapist and case Production designer, theatre/television/film. On the day of the discharge the patient's vitals were stable, and no other acute medical condition were reported by patient. the patient was felt safe to be discharge at [facility] with therapy.  Procedures and Results:  Echocardiogram    Consultations:  Neurology  DISCHARGE MEDICATION: Current Discharge Medication List    START taking these medications   Details  clopidogrel (PLAVIX) 75 MG tablet Take 1 tablet (75 mg total) daily by mouth. Qty: 120 tablet, Refills: 0      CONTINUE these medications which have NOT CHANGED   Details  atorvastatin (LIPITOR) 40 MG tablet Take 1 tablet (40 mg total) by mouth daily at 6 PM.    FLUoxetine (PROZAC) 20 MG tablet TAKE ONE TABLET BY MOUTH ONCE DAILY Qty: 90 tablet, Refills: 0  oxybutynin (DITROPAN) 5 MG tablet Take 5 mg at bedtime by mouth.    topiramate (TOPAMAX) 50 MG tablet Take 50 mg by mouth 2 (two) times daily.      STOP taking these medications     aspirin 325 MG tablet      ibuprofen (ADVIL,MOTRIN) 200 MG tablet        No Known Allergies Discharge Instructions    Ambulatory referral to  Neurology   Complete by:  As directed    An appointment is requested in approximately: 6 weeks Follow up with stroke clinic (Dr Pearlean Brownie preferred, if not available, then consider Sylvie Farrier, [facility] or Lucia Gaskins whoever is available) at [facility] in about 6-8 weeks. Thanks.   Diet - low sodium heart healthy   Complete by:  As directed    Discharge instructions   Complete by:  As directed    It is important that you read following instructions as well as go over your medication list with RN to help you understand your care after this hospitalization.  Discharge Instructions: Please follow-up with PCP in one week  Please request your primary care physician to go over all Hospital Tests and Procedure/Radiological results at the follow up,  Please get all Hospital records sent to your PCP by signing hospital release before you go home.   You were cared for by a hospitalist during your hospital stay. If you have any questions about your discharge medications or the care you received while you were in the hospital after you are discharged, you can call the unit and ask to speak with the hospitalist on call if the hospitalist that took care of you is not available.  Once you are discharged, your primary care physician will handle any further medical issues. Please note that NO REFILLS for any discharge medications will be authorized once you are discharged, as it is imperative that you return to your primary care physician (or establish a relationship with a primary care physician if you do not have one) for your aftercare needs so that they can reassess your need for medications and monitor your lab values. You Must read complete instructions/literature along with all the possible adverse reactions/side effects for all the Medicines you take and that have been prescribed to you. Take any new Medicines after you have completely understood and accept all the possible adverse reactions/side effects.   Increase  activity slowly   Complete by:  As directed      Discharge Exam: Filed Weights   04/03/17 2258 04/04/17 0140  Weight: 45.2 kg (99 lb 11.2 oz) 44.2 kg (97 lb 8 oz)   Vitals:   04/05/17 0701 04/05/17 1009  BP: 140/63 132/62  Pulse: 64 71  Resp: 18 18  Temp: 98.2 F (36.8 C) 99.4 F (37.4 C)  SpO2: 95% 97%   General: Appear in no distress, no Rash; Oral Mucosa moist. Cardiovascular: S1 and S2 Present, no Murmur, no JVD Respiratory: Bilateral Air entry present and Clear to Auscultation, no Crackles, no wheezes Abdomen: Bowel Sound present, Soft and no tenderness Extremities: no Pedal edema, no calf tenderness Neurology: right sided weakness  The results of significant diagnostics from this hospitalization (including imaging, microbiology, ancillary and laboratory) are listed below for reference.    Significant Diagnostic Studies: Dg Chest 2 View  Result Date: 04/03/2017 CLINICAL DATA:  Code stroke, crackles on lung exam EXAM: CHEST  2 VIEW COMPARISON:  02/01/2016 FINDINGS: Low lung volumes. No focal pulmonary infiltrate, consolidation or effusion.  Cardiomediastinal silhouette within normal limits. Aortic atherosclerosis. No pneumothorax. Calcified breast implants. Stable mild wedge deformities of mid to upper thoracic vertebra. IMPRESSION: No active cardiopulmonary disease. Electronically Signed   By: Jasmine Pang M.D.   On: 04/03/2017 23:46   Mr Angiogram Head Wo Contrast  Result Date: 04/04/2017 CLINICAL DATA:  Increasing RIGHT-sided weakness. Suspect stroke. History of stroke, trigeminal neuralgia, tremor, hypertension, hyperlipidemia. EXAM: MRI HEAD WITHOUT AND WITH CONTRAST MRA HEAD WITHOUT CONTRAST TECHNIQUE: Multiplanar, multiecho pulse sequences of the brain and surrounding structures were obtained without and with intravenous contrast. Angiographic images of the head were obtained using MRA technique without contrast. CONTRAST:  9 cc MultiHance COMPARISON:  CT HEAD April 03, 2017 at 2218 hours and MRI of the head February 01, 2016 FINDINGS: MRI HEAD FINDINGS INTRACRANIAL CONTENTS: 4 mm reduced diffusion LEFT parietal cortex at convexity, too small to characterize on ADC maps, though there is faint enhancement. Reduced diffusion in the LEFT parietal white matter with normalized ADC values. Chronic RIGHT parietal microhemorrhage. Small areas bifrontal and RIGHT parietal encephalomalacia. Confluent supratentorial white matter FLAIR T2 hyperintensities. Patchy pontine white matter FLAIR T2 hyperintensities. Early LEFT cerebral peduncle wallerian degeneration. Old LEFT posterior limb of the internal capsule lacunar infarct. Old RIGHT basal ganglia lacunar infarct. Prominent basal ganglia and thalamus perivascular spaces associated with chronic small vessel ischemic disease. No midline shift, mass effect or masses. Moderate to severe parenchymal brain volume loss. No hydrocephalus. No abnormal parenchymal or extra-axial enhancement. No extra-axial fluid collections or masses. VASCULAR: See below. SKULL AND UPPER CERVICAL SPINE: No abnormal sellar expansion. No suspicious calvarial bone marrow signal. Craniocervical junction maintained. SINUSES/ORBITS: The mastoid air-cells and included paranasal sinuses are well-aerated.The included ocular globes and orbital contents are non-suspicious. Status post bilateral ocular lens implants. OTHER: None. MRA HEAD FINDINGS ANTERIOR CIRCULATION: Normal flow related enhancement of the included cervical, petrous, cavernous and supraclinoid internal carotid arteries. RIGHT posterior communicating artery origin infundibulum. Patent anterior communicating artery. Patent anterior and middle cerebral arteries, including distal segments. No large vessel occlusion, flow limiting stenosis, aneurysm. POSTERIOR CIRCULATION: Codominant vertebral artery's. Basilar artery is patent, with normal flow related enhancement of the main branch vessels. Fenestrated proximal  basilar artery. Patent posterior cerebral arteries. Mild stenosis bilateral P2 segments compatible with atherosclerosis. No large vessel occlusion, flow limiting stenosis,  aneurysm. ANATOMIC VARIANTS: None. Source images and MIP images were reviewed. IMPRESSION: MRI HEAD: 1. Subacute enhancing 4 mm LEFT parietal infarct. Subacute small LEFT parietal white matter infarct. 2. Small bifrontal and RIGHT parietal infarcts in MCA territories. 3. Severe chronic small vessel ischemic disease. Old lacunar infarcts. MRA HEAD: 1. No emergent large vessel occlusion or flow limiting stenosis. Electronically Signed   By: Awilda Metro M.D.   On: 04/04/2017 01:37   Mr Laqueta Jean And Wo Contrast  Result Date: 04/04/2017 CLINICAL DATA:  Increasing RIGHT-sided weakness. Suspect stroke. History of stroke, trigeminal neuralgia, tremor, hypertension, hyperlipidemia. EXAM: MRI HEAD WITHOUT AND WITH CONTRAST MRA HEAD WITHOUT CONTRAST TECHNIQUE: Multiplanar, multiecho pulse sequences of the brain and surrounding structures were obtained without and with intravenous contrast. Angiographic images of the head were obtained using MRA technique without contrast. CONTRAST:  9 cc MultiHance COMPARISON:  CT HEAD April 03, 2017 at 2218 hours and MRI of the head February 01, 2016 FINDINGS: MRI HEAD FINDINGS INTRACRANIAL CONTENTS: 4 mm reduced diffusion LEFT parietal cortex at convexity, too small to characterize on ADC maps, though there is faint enhancement. Reduced diffusion in the LEFT parietal white  matter with normalized ADC values. Chronic RIGHT parietal microhemorrhage. Small areas bifrontal and RIGHT parietal encephalomalacia. Confluent supratentorial white matter FLAIR T2 hyperintensities. Patchy pontine white matter FLAIR T2 hyperintensities. Early LEFT cerebral peduncle wallerian degeneration. Old LEFT posterior limb of the internal capsule lacunar infarct. Old RIGHT basal ganglia lacunar infarct. Prominent basal ganglia and  thalamus perivascular spaces associated with chronic small vessel ischemic disease. No midline shift, mass effect or masses. Moderate to severe parenchymal brain volume loss. No hydrocephalus. No abnormal parenchymal or extra-axial enhancement. No extra-axial fluid collections or masses. VASCULAR: See below. SKULL AND UPPER CERVICAL SPINE: No abnormal sellar expansion. No suspicious calvarial bone marrow signal. Craniocervical junction maintained. SINUSES/ORBITS: The mastoid air-cells and included paranasal sinuses are well-aerated.The included ocular globes and orbital contents are non-suspicious. Status post bilateral ocular lens implants. OTHER: None. MRA HEAD FINDINGS ANTERIOR CIRCULATION: Normal flow related enhancement of the included cervical, petrous, cavernous and supraclinoid internal carotid arteries. RIGHT posterior communicating artery origin infundibulum. Patent anterior communicating artery. Patent anterior and middle cerebral arteries, including distal segments. No large vessel occlusion, flow limiting stenosis, aneurysm. POSTERIOR CIRCULATION: Codominant vertebral artery's. Basilar artery is patent, with normal flow related enhancement of the main branch vessels. Fenestrated proximal basilar artery. Patent posterior cerebral arteries. Mild stenosis bilateral P2 segments compatible with atherosclerosis. No large vessel occlusion, flow limiting stenosis,  aneurysm. ANATOMIC VARIANTS: None. Source images and MIP images were reviewed. IMPRESSION: MRI HEAD: 1. Subacute enhancing 4 mm LEFT parietal infarct. Subacute small LEFT parietal white matter infarct. 2. Small bifrontal and RIGHT parietal infarcts in MCA territories. 3. Severe chronic small vessel ischemic disease. Old lacunar infarcts. MRA HEAD: 1. No emergent large vessel occlusion or flow limiting stenosis. Electronically Signed   By: Awilda Metroourtnay  Bloomer M.D.   On: 04/04/2017 01:37   Ct Head Code Stroke Wo Contrast  Addendum Date: 04/04/2017     ADDENDUM REPORT: 04/04/2017 01:06 ADDENDUM: Critical Value/emergent results were called by telephone at the time of interpretation on 04/04/2017 at 1:05 am to Dr. Otelia LimesLindzen, Neurology, who verbally acknowledged these results. Electronically Signed   By: Awilda Metroourtnay  Bloomer M.D.   On: 04/04/2017 01:06   Result Date: 04/04/2017 CLINICAL DATA:  Code stroke.  RIGHT-sided weakness. EXAM: CT HEAD WITHOUT CONTRAST TECHNIQUE: Contiguous axial images were obtained from the base of the skull through the vertex without intravenous contrast. COMPARISON:  CT HEAD February 01, 2016 and MRI of the head February 01, 2016 FINDINGS: BRAIN: No intraparenchymal hemorrhage, mass effect nor midline shift. Confluent similar supratentorial and pontine white matter hypodensities. No acute large vascular territory infarcts. Small areas RIGHT frontoparietal encephalomalacia. Old LEFT posterior limb of in the internal capsule small infarct. Faint hypodensities bilateral basal ganglia and thalamus a responding to perivascular spaces associated with chronic small vessel ischemic disease. No abnormal extra-axial fluid collections. Basal cisterns are patent. VASCULAR: Moderate calcific atherosclerosis of the carotid siphons. SKULL: No skull fracture. No significant scalp soft tissue swelling. SINUSES/ORBITS: The mastoid air-cells and included paranasal sinuses are well-aerated.The included ocular globes and orbital contents are non-suspicious. OTHER: None. ASPECTS [facility]([place] Stroke Program Early CT Score) - Ganglionic level infarction (caudate, lentiform nuclei, internal capsule, insula, M1-M3 cortex): 7 - Supraganglionic infarction (M4-M6 cortex): 3 Total score (0-10 with 10 being normal): 10 IMPRESSION: 1. No acute intracranial process. 2. Moderate to severe chronic small vessel ischemic disease and old lacunar infarcts. 3. Small areas RIGHT frontoparietal encephalomalacia most compatible old RIGHT MCA territory infarcts. 4. ASPECTS is 10. 5. Dr.  Otelia LimesLindzen, Neurology  paged via [employer] secure hospital paging system on 04/03/2017 at 10:28 pm, awaiting return call. Electronically Signed: By: Awilda Metroourtnay  Bloomer M.D. On: 04/03/2017 22:32    Microbiology: No results found for this or any previous visit (from the past 240 hour(s)).   Labs: CBC: Recent Labs  Lab 04/03/17 2214 04/03/17 2223  WBC 9.4  --   NEUTROABS 6.4  --   HGB 11.8* 11.6*  HCT 36.3 34.0*  MCV 90.5  --   PLT 164  --    Basic Metabolic Panel: Recent Labs  Lab 04/03/17 2214 04/03/17 2223  NA 139 143  K 3.6 3.6  CL 111 108  CO2 23  --   GLUCOSE 109* 111*  BUN 23* 23*  CREATININE 0.77 0.70  CALCIUM 8.7*  --    Liver Function Tests: Recent Labs  Lab 04/03/17 2214  AST 20  ALT 20  ALKPHOS 74  BILITOT 0.3  PROT 6.3*  ALBUMIN 3.3*   No results for input(s): LIPASE, AMYLASE in the last 168 hours. No results for input(s): AMMONIA in the last 168 hours. Cardiac Enzymes: No results for input(s): CKTOTAL, CKMB, CKMBINDEX, TROPONINI in the last 168 hours. BNP (last 3 results) No results for input(s): BNP in the last 8760 hours. CBG: Recent Labs  Lab 04/04/17 1132 04/04/17 1659 04/04/17 2135 04/05/17 0725 04/05/17 1154  GLUCAP 104* 107* 91 83 96   Time spent: 35 minutes  Signed:  Pricella Gaugh  [facility]  04/05/2017  , 2:12 PM

## 2017-04-05 NOTE — Evaluation (Signed)
SLP Cancellation Note  Patient Details Name: Kelly [name] MRN: [id number] DOB: August 12, 1927   Cancelled treatment:       Reason Eval/Treat Not Completed: [facility] (comment[facility]   Mills KollerKimball, Auston Halfmann Ann Tyreik Delahoussaye, MS [facility] [phone]

## 2017-04-06 ENCOUNTER — Inpatient Hospital Stay (HOSPITAL_COMMUNITY): Payer: Medicare Other | Admitting: Physical Therapy

## 2017-04-06 ENCOUNTER — Inpatient Hospital Stay (HOSPITAL_COMMUNITY): Payer: Medicare Other | Admitting: Occupational Therapy

## 2017-04-06 ENCOUNTER — Inpatient Hospital Stay (HOSPITAL_COMMUNITY): Payer: Medicare Other | Admitting: Speech Pathology

## 2017-04-06 DIAGNOSIS — D638 Anemia in other chronic diseases classified elsewhere: Secondary | ICD-10-CM

## 2017-04-06 DIAGNOSIS — I10 Essential (primary) hypertension: Secondary | ICD-10-CM

## 2017-04-06 DIAGNOSIS — E8809 Other disorders of plasma-protein metabolism, not elsewhere classified: Secondary | ICD-10-CM

## 2017-04-06 DIAGNOSIS — E46 Unspecified protein-calorie malnutrition: Secondary | ICD-10-CM

## 2017-04-06 DIAGNOSIS — I6389 Other cerebral infarction: Secondary | ICD-10-CM

## 2017-04-06 DIAGNOSIS — N3281 Overactive bladder: Secondary | ICD-10-CM

## 2017-04-06 LAB — COMPREHENSIVE METABOLIC PANEL
ALT: 16 U/L (ref 14–54)
AST: 16 U/L (ref 15–41)
Albumin: 3 g/dL — ABNORMAL LOW (ref 3.5–5.0)
Alkaline Phosphatase: 71 U/L (ref 38–126)
Anion gap: 8 (ref 5–15)
BUN: 15 mg/dL (ref 6–20)
CO2: 22 mmol/L (ref 22–32)
Calcium: 8.7 mg/dL — ABNORMAL LOW (ref 8.9–10.3)
Chloride: 110 mmol/L (ref 101–111)
Creatinine, Ser: 0.62 mg/dL (ref 0.44–1.00)
GFR calc Af Amer: >60 mL/min (ref >60)
GFR calc non Af Amer: >60 mL/min (ref >60)
Glucose, Bld: 77 mg/dL (ref 65–99)
Potassium: 3.7 mmol/L (ref 3.5–5.1)
Sodium: 140 mmol/L (ref 135–145)
Total Bilirubin: 0.5 mg/dL (ref 0.3–1.2)
Total Protein: 5.7 g/dL — ABNORMAL LOW (ref 6.5–8.1)

## 2017-04-06 LAB — CBC WITH DIFFERENTIAL/PLATELET
Basophils Absolute: 0.1 10*3/uL (ref 0.0–0.1)
Basophils Relative: 1 %
Eosinophils Absolute: 0.3 10*3/uL (ref 0.0–0.7)
Eosinophils Relative: 4 %
HCT: 33.8 % — ABNORMAL LOW (ref 36.0–46.0)
Hemoglobin: 11 g/dL — ABNORMAL LOW (ref 12.0–15.0)
Lymphocytes Relative: 29 %
Lymphs Abs: 2.1 10*3/uL (ref 0.7–4.0)
MCH: 29.4 pg (ref 26.0–34.0)
MCHC: 32.5 g/dL (ref 30.0–36.0)
MCV: 90.4 fL (ref 78.0–100.0)
Monocytes Absolute: 0.8 10*3/uL (ref 0.1–1.0)
Monocytes Relative: 11 %
Neutro Abs: 3.9 10*3/uL (ref 1.7–7.7)
Neutrophils Relative %: 55 %
Platelets: 204 10*3/uL (ref 150–400)
RBC: 3.74 MIL/uL — ABNORMAL LOW (ref 3.87–5.11)
RDW: 13.7 % (ref 11.5–15.5)
WBC: 7.2 10*3/uL (ref 4.0–10.5)

## 2017-04-06 LAB — GLUCOSE, CAPILLARY
Glucose-Capillary: 77 mg/dL (ref 65–99)
Glucose-Capillary: 86 mg/dL (ref 65–99)
Glucose-Capillary: 119 mg/dL — ABNORMAL HIGH (ref 65–99)
Glucose-Capillary: 93 mg/dL (ref 65–99)

## 2017-04-06 MED ORDER — PRO-STAT SUGAR FREE PO LIQD
30.0000 mL | Freq: Two times a day (BID) | ORAL | Status: DC
  Administered 2017-04-06 – 2017-04-13 (×13): 30 mL via ORAL
  Filled 2017-04-06 (×14): qty 30

## 2017-04-06 NOTE — Progress Notes (Signed)
Team Conference noted today. Pt is admitted for CVA with R sided weakness. Continent of b/b and c/o having the feeling to void, but not able to . Bladder scan completed per request and reading is 0ml. Pt educated to increase po fluid intake and attempt to void within the hour. Left hand with noted tremor. Able to make needs known. Assist x 1. Continues on a carb modified diet with meds whole in thin liquids, but noted to cough after medication administration with watery eyes. Yogurt offered and accepted to assist in getting the "pills down all the way" which was effective. Difficulty noted even though pt was at a 90 degree angle and would benefit with meds whole in puree to ensure safe and complete swallow. Pt denies any pain this shift. Able to make needs known. callbell within reach. Will continue to monitor.

## 2017-04-06 NOTE — Evaluation (Addendum)
Speech Language Pathology Assessment and Plan  Patient Details  Name: Kelly [name] MRN: [id number] Date of Birth: 11-25-1927  SLP Diagnosis: Speech and Language deficits  Rehab Potential: Excellent ELOS: 7-10 days     Today's Date: 04/06/2017 SLP Individual Time: 0805-0900 SLP Individual Time Calculation (min): 55 min   Problem List:  Patient Active Problem List   Diagnosis Date Noted  . Anemia of chronic disease   . Hypoalbuminemia due to protein-calorie malnutrition ([place])   . Acute bilateral cerebral infarction in a watershed distribution 04/05/2017  . Benign essential HTN   . Chronic diastolic heart failure ([place])   . Right spastic hemiparesis ([place])   . History of stroke 02/16/2017  . Abnormality of gait following cerebrovascular accident (CVA) 02/16/2017  . Slow transit constipation   . Acute blood loss anemia   . Adjustment disorder with depressed mood   . Cerebrovascular accident (CVA) ([place]) 02/03/2016  . Dysarthria, post-stroke   . Dysphagia, post-stroke   . Leukocytosis   . Prediabetes   . Right hemiparesis ([place])   . Cerebral infarction due to unspecified mechanism   . Other secondary hypertension   . Overactive bladder   . Diastolic dysfunction   . Hyperlipidemia   . Essential hypertension   . Essential tremor   . Stroke (cerebrum) ([place]) 02/01/2016  . Facial droop due to stroke 02/01/2016  . Essential and other specified forms of tremor 02/20/2013  . Dysphonia 02/20/2013  . Trigeminal neuralgia 02/20/2013   Past Medical History:  Past Medical History:  Diagnosis Date  . Acute blood loss anemia   . Adjustment disorder with depressed mood   . CVA (cerebral vascular accident) ([place]) 02/03/2016   Linear infarct within the posterior limb of left internal capsule  . Diastolic CHF ([place])   . Dysphagia   . Dysphonia 02/20/2013  . Essential and other specified forms of tremor 02/20/2013  . HLD (hyperlipidemia)   . HTN (hypertension)   . OAB (overactive  bladder)   . Osteoporosis   . Patient receiving subcutaneous heparin    For DVT prophylaxis 9/17  . Prediabetes   . Psoriasis   . Slow transit constipation   . Stroke (cerebrum) ([place]) 02/01/2016  . Trigeminal neuralgia 02/20/2013   Past Surgical History:  Past Surgical History:  Procedure Laterality Date  . APPENDECTOMY  1940  . bladder tack    . CATARACT EXTRACTION Bilateral   . HEMORRHOID SURGERY  1960  . HUMERUS FRACTURE SURGERY    . LAPAROSCOPIC HYSTERECTOMY  2006  . torn rotator cuff    . WRIST FRACTURE SURGERY  2005   seconday shoulder 2001    Assessment / Plan / Recommendation Clinical Impression   Kelly [name] a 83 y.o.right handed femalewith history of documented hypertension, diastolic congestive heart failure, prediabetes, left MCA with right-sided residual weakness maintained on aspirin and received inpatient rehabilitation services September 2017 and discharged to home at a supervision level ambulating 120 feet. Per chart reviewand patient,patient lives with spouse. 2 level home with main level bedroom and bathroom and 2 steps to entry. She used a rolling walker for ambulation. Husband assists with some basic ADLs. Presented 04/03/2017 with increasing right sided weakness. Cranial CTreviewed, showingno acute intracranial process. Patient did not receive TPA. MRI revealed subacute enhancing 4 mm left parietal infarct. Subacute small left parietal white matter infarct. Small bifrontal and right parietal infarctsinMCA territories.  Physical and occupational therapy evaluations completed. M.D. has requested physical medicine rehabilitation consult. Patient was admitted  for a comprehensive rehabilitation program.  SLP evaluation was completed on 04/06/2017 with results as follows: Bedside swallow evaluation completed with pt demonstrating right sided oral motor weakness which did not appear to impact oral containment, transit, and clearance of boluses.  Vocal quality is  slightly hoarse and pt had 1 instance of coughing with mixed solid and liquid consistencies; no other overt s/s of aspiration were evident with solids or liquids when mixed or in isolation.  Recommend that pt remain on her currently prescribed diet and no further ST needs are identified for swallowing.   Pt also presents with mild cognitive-linguistic deficits characterized by decreased naming fluency, decreased recall of daily information, and slowed response time.  Pt does appear to be hard of hearing which could account for some of these deficits but pt also subjectively reports feeling "slower" than usual.  As a result, pt would benefit from skilled ST  1-3x/week while inpatient in order to maximize functional independence and reduce burden of care prior to discharge.  Discharge recommendations to be determined pending progress made while inpatient.    Skilled Therapeutic Interventions          Cognitive-linguistic and bedside swallow evaluation completed with results and recommendations reviewed with patient.   Pt was able to recall 5 out of 5 words on delayed recall subtest of MoCA but needed up to min assist for recall of daily information.  Pt also needed min verbal cues for working memory during serial subtraction subtest of MoCA.  Pt had mild intermittent, word finding difficulty throughout evaluation and was only able to name 4 objects within a targeted category in one minute.  Pt reports feeling slower overall with this acute CVA.  Discussed rationale for ST interventions while inpatient.  All questions were answered to pt's satisfaction at this time.      SLP Assessment  Patient will need skilled [facility] during CIR admission    Recommendations  Patient destination: Home Follow up Recommendations: Other (comment)(TBD) Equipment Recommended: None recommended by SLP    SLP Frequency 1 to 3 out of 7 days   SLP Duration  SLP Intensity  SLP Treatment/Interventions  7-10 days   Minumum of 1-2 x/day, 30 to 90 minutes  Cognitive remediation/compensation;Cueing hierarchy;Patient/family education;Internal/external aids;Speech/Language facilitation    Pain Pain Assessment Pain Assessment: No/denies pain  Prior Functioning Cognitive/Linguistic Baseline: Baseline deficits Baseline deficit details: husband managed most household responsibilities Type of Home: House  Lives With: Spouse Available Help at Discharge: Family;Available 24 hours/day Vocation: Retired  Function:  Eating Eating   Modified Consistency Diet: No Eating Assist Level: More than reasonable amount of time;Set up assist for   Eating Set Up Assist For: Opening containers;Cutting food       Cognition Comprehension Comprehension assist level: Follows complex conversation/direction with extra time/assistive device  Expression   Expression assist level: Expresses basic 90% of the time/requires cueing < 10% of the time.  Social Interaction Social Interaction assist level: Interacts appropriately with others - No medications needed.  Problem Solving Problem solving assist level: Solves basic 90% of the time/requires cueing < 10% of the time  Memory Memory assist level: Recognizes or recalls 75 - 89% of the time/requires cueing 10 - 24% of the time   Short Term Goals: Week 1: SLP Short Term Goal 1 (Week 1): STG=LTG due to ELOS  Refer to Care Plan for Long Term Goals  Recommendations for other services: None   Discharge Criteria: Patient will be discharged  from SLP if patient refuses treatment 3 consecutive times without medical reason, if treatment goals not met, if there is a change in medical status, if patient makes no progress towards goals or if patient is discharged from hospital.  The above assessment, treatment plan, treatment alternatives and goals were discussed and mutually agreed upon: by patient  Emilio Math 04/06/2017, 10:23 AM

## 2017-04-06 NOTE — Evaluation (Signed)
Occupational Therapy Assessment and Plan  Patient Details  Name: Kelly [name] MRN: [id number] Date of Birth: 25-Jun-1927  OT Diagnosis: hemiplegia affecting dominant side and muscle weakness (generalized) Rehab Potential: Rehab Potential (ACUTE ONLY): Good ELOS: 10-14 days   Today's Date: 04/06/2017 OT Individual Time: 8182-9937 OT Individual Time Calculation (min): 71 min     Problem List:  Patient Active Problem List   Diagnosis Date Noted  . Anemia of chronic disease   . Hypoalbuminemia due to protein-calorie malnutrition ([place])   . Acute bilateral cerebral infarction in a watershed distribution 04/05/2017  . Benign essential HTN   . Chronic diastolic heart failure ([place])   . Right spastic hemiparesis ([place])   . History of stroke 02/16/2017  . Abnormality of gait following cerebrovascular accident (CVA) 02/16/2017  . Slow transit constipation   . Acute blood loss anemia   . Adjustment disorder with depressed mood   . Cerebrovascular accident (CVA) ([place]) 02/03/2016  . Dysarthria, post-stroke   . Dysphagia, post-stroke   . Leukocytosis   . Prediabetes   . Right hemiparesis ([place])   . Cerebral infarction due to unspecified mechanism   . Other secondary hypertension   . Overactive bladder   . Diastolic dysfunction   . Hyperlipidemia   . Essential hypertension   . Essential tremor   . Stroke (cerebrum) ([place]) 02/01/2016  . Facial droop due to stroke 02/01/2016  . Essential and other specified forms of tremor 02/20/2013  . Dysphonia 02/20/2013  . Trigeminal neuralgia 02/20/2013    Past Medical History:  Past Medical History:  Diagnosis Date  . Acute blood loss anemia   . Adjustment disorder with depressed mood   . CVA (cerebral vascular accident) ([place]) 02/03/2016   Linear infarct within the posterior limb of left internal capsule  . Diastolic CHF ([place])   . Dysphagia   . Dysphonia 02/20/2013  . Essential and other specified forms of tremor 02/20/2013  . HLD  (hyperlipidemia)   . HTN (hypertension)   . OAB (overactive bladder)   . Osteoporosis   . Patient receiving subcutaneous heparin    For DVT prophylaxis 9/17  . Prediabetes   . Psoriasis   . Slow transit constipation   . Stroke (cerebrum) ([place]) 02/01/2016  . Trigeminal neuralgia 02/20/2013   Past Surgical History:  Past Surgical History:  Procedure Laterality Date  . APPENDECTOMY  1940  . bladder tack    . CATARACT EXTRACTION Bilateral   . HEMORRHOID SURGERY  1960  . HUMERUS FRACTURE SURGERY    . LAPAROSCOPIC HYSTERECTOMY  2006  . torn rotator cuff    . WRIST FRACTURE SURGERY  2005   seconday shoulder 2001    Assessment & Plan Clinical Impression: Patient is a 83 y.o. right handed femalewith history of documented hypertension, diastolic congestive heart failure, prediabetes, left MCA with right-sided residual weakness maintained on aspirin and received inpatient rehabilitation services September 2017 and discharged to home at a supervision level ambulating 120 feet. Per chart reviewand patient,patient lives with spouse. 2 level home with main level bedroom and bathroom and 2 steps to entry. She used a rolling walker for ambulation. Husband assists with some basic ADLs. Presented 04/03/2017 with increasing right sided weakness. Cranial CTreviewed, showingno acute intracranial process. Patient did not receive TPA. MRI revealed subacute enhancing 4 mm left parietal infarct. Subacute small left parietal white matter infarct. Small bifrontal and right parietal infarctsinMCA territories. MRA with no emergent large vessel occlusion or stenosis. Carotid Dopplers in no ICA  stenosis. Echocardiogram pending. Neurology consulted present maintained on Plavix for CVA prophylaxis. Subcutaneous Lovenox for DVT prophylaxis. Maintained on a regular consistency diet. Physical and occupational therapy evaluations completed. M.D. has requested physical medicine rehabilitation consult. Patient was  admitted for a comprehensive rehabilitation program    Patient transferred to CIR on 04/05/2017 .    Patient currently requires mod with basic self-care skills secondary to muscle weakness, abnormal tone, unbalanced muscle activation and decreased coordination and decreased standing balance, decreased postural control and hemiplegia.  Prior to hospitalization, patient could complete ADLs with supervision.  Patient will benefit from skilled intervention to decrease level of assist with basic self-care skills prior to discharge home with care partner.  Anticipate patient will require intermittent supervision and follow up home health.  OT - End of Session Activity Tolerance: Endurance does not limit participation in activity Endurance Deficit: No OT Assessment Rehab Potential (ACUTE ONLY): Good OT Patient demonstrates impairments in the following area(s): Balance;Cognition;Endurance;Motor OT Basic ADL's Functional Problem(s): Grooming;Bathing;Dressing;Toileting OT Transfers Functional Problem(s): Toilet;Tub/Shower OT Additional Impairment(s): Fuctional Use of Upper Extremity OT Plan OT Intensity: Minimum of 1-2 x/day, 45 to 90 minutes OT Frequency: 5 out of 7 days OT Duration/Estimated Length of Stay: 10-14 days OT Treatment/Interventions: Balance/vestibular training;Cognitive remediation/compensation;Discharge planning;Disease mangement/prevention;DME/adaptive equipment instruction;Functional electrical stimulation;Functional mobility training;Neuromuscular re-education;Pain management;Patient/family education;Psychosocial support;Self Care/advanced ADL retraining;Skin care/wound managment;Therapeutic Activities;Splinting/orthotics;Therapeutic Exercise;UE/LE Strength taining/ROM;UE/LE Coordination activities OT Basic Self-Care Anticipated Outcome(s): Supervision OT Toileting Anticipated Outcome(s): Supervision OT Bathroom Transfers Anticipated Outcome(s): Supervision OT  Recommendation Patient destination: Home Follow Up Recommendations: Home health OT;24 hour supervision/assistance Equipment Recommended: None recommended by OT   Skilled Therapeutic Intervention OT eval completed with discussion of rehab process, OT purpose, POC, ELOS, and goals.  ADL assessment completed with toilet transfers and bathing/dressing at sit > stand level at sink.  Pt required mod assist for stand pivot transfers, due to requiring lifting assistance initially.  Pt with tendency to lose balance posteriorly during standing.  Decreased use of dominant RUE, however pt would attempt to integrate into self-care tasks as able.  Pt demonstrating good compensatory strategies from previous stroke.    OT Evaluation Precautions/Restrictions  Precautions Precautions: Fall Precaution Comments: R sided weakness Pain Pain Assessment Pain Assessment: No/denies pain Home Living/Prior Functioning Home Living Available Help at Discharge: Family, Available 24 hours/day Type of Home: House Home Access: Stairs to enter [employer] of Steps: 2 Entrance Stairs-Rails: None(reports she has a post she would hold onto) Home Layout: Two level, Able to live on main level with bedroom/bathroom Bathroom Shower/Tub: Tub/shower unit, Architectural technologist: Standard Bathroom Accessibility: Yes Additional Comments: was receiving OP therapy until June  Lives With: Spouse Prior Function Level of Independence: Needs assistance with ADLs, Requires assistive device for independence  Able to Take Stairs?: Yes(with assist from husband) Driving: No Vocation: Retired Comments: husband assists with all activities except for a cleaning person who comes every other week.  ADL  See Function Navigator Vision Baseline Vision/History: Wears glasses Wears Glasses: At all times Patient Visual Report: No change from baseline Vision Assessment?: No apparent visual deficits Cognition Overall Cognitive  Status: Impaired/Different from baseline Arousal/Alertness: Awake/alert Orientation Level: Person;Place;Situation Person: Oriented Place: Oriented Situation: Oriented Year: 2018 Month: November Day of Week: Correct Memory: Impaired Memory Impairment: Decreased recall of new information Immediate Memory Recall: Sock;Blue;Bed Memory Recall: Bed Memory Recall Bed: Without Cue Attention: Selective Selective Attention: Appears intact Awareness: Appears intact Problem Solving: Appears intact Safety/Judgment: Appears intact Comments: overall slowed processing  Sensation Sensation  Light Touch: Appears Intact Proprioception: Appears Intact Coordination Gross Motor Movements are Fluid and Coordinated: No Fine Motor Movements are Fluid and Coordinated: No Finger Nose Finger Test: decreased gross motor with RUE Extremity/Trunk Assessment RUE Assessment RUE Assessment: Exceptions to WFL(shoulder flexion limited to 90*, elbow flexion WFL, extension to 20*, loose gross grasp) LUE Assessment LUE Assessment: Within Functional Limits   See Function Navigator for Current Functional Status.   Refer to Care Plan for Long Term Goals  Recommendations for other services: None    Discharge Criteria: Patient will be discharged from OT if patient refuses treatment 3 consecutive times without medical reason, if treatment goals not met, if there is a change in medical status, if patient makes no progress towards goals or if patient is discharged from hospital.  The above assessment, treatment plan, treatment alternatives and goals were discussed and mutually agreed upon: by patient and by family  Ellwood Dense [facility] 04/06/2017, 12:18 PM

## 2017-04-06 NOTE — Progress Notes (Signed)
Patient information reviewed and entered into eRehab system by Kemal Amores, RN, CRRN, PPS Coordinator.  Information including medical coding and functional independence measure will be reviewed and updated through discharge.     Per nursing patient was given "Data Collection Information Summary for Patients in Inpatient Rehabilitation Facilities with attached "Privacy Act Statement-Health Care Records" upon admission.  

## 2017-04-06 NOTE — Evaluation (Signed)
Physical Therapy Assessment and Plan  Patient Details  Name: Kelly [name] MRN: [id number] Date of Birth: 10/29/1927  PT Diagnosis: Abnormal posture, Abnormality of gait, Cognitive deficits, Difficulty walking, Hemiplegia dominant and Muscle weakness Rehab Potential: Good ELOS: 10 days   Today's Date: 04/06/2017 PT Individual Time: 1400-1510 PT Individual Time Calculation (min): 70 min    Problem List:  Patient Active Problem List   Diagnosis Date Noted  . Anemia of chronic disease   . Hypoalbuminemia due to protein-calorie malnutrition ([place])   . Acute bilateral cerebral infarction in a watershed distribution 04/05/2017  . Benign essential HTN   . Chronic diastolic heart failure ([place])   . Right spastic hemiparesis ([place])   . History of stroke 02/16/2017  . Abnormality of gait following cerebrovascular accident (CVA) 02/16/2017  . Slow transit constipation   . Acute blood loss anemia   . Adjustment disorder with depressed mood   . Cerebrovascular accident (CVA) ([place]) 02/03/2016  . Dysarthria, post-stroke   . Dysphagia, post-stroke   . Leukocytosis   . Prediabetes   . Right hemiparesis ([place])   . Cerebral infarction due to unspecified mechanism   . Other secondary hypertension   . Overactive bladder   . Diastolic dysfunction   . Hyperlipidemia   . Essential hypertension   . Essential tremor   . Stroke (cerebrum) ([place]) 02/01/2016  . Facial droop due to stroke 02/01/2016  . Essential and other specified forms of tremor 02/20/2013  . Dysphonia 02/20/2013  . Trigeminal neuralgia 02/20/2013    Past Medical History:  Past Medical History:  Diagnosis Date  . Acute blood loss anemia   . Adjustment disorder with depressed mood   . CVA (cerebral vascular accident) ([place]) 02/03/2016   Linear infarct within the posterior limb of left internal capsule  . Diastolic CHF ([place])   . Dysphagia   . Dysphonia 02/20/2013  . Essential and other specified forms of tremor 02/20/2013  . HLD  (hyperlipidemia)   . HTN (hypertension)   . OAB (overactive bladder)   . Osteoporosis   . Patient receiving subcutaneous heparin    For DVT prophylaxis 9/17  . Prediabetes   . Psoriasis   . Slow transit constipation   . Stroke (cerebrum) ([place]) 02/01/2016  . Trigeminal neuralgia 02/20/2013   Past Surgical History:  Past Surgical History:  Procedure Laterality Date  . APPENDECTOMY  1940  . bladder tack    . CATARACT EXTRACTION Bilateral   . HEMORRHOID SURGERY  1960  . HUMERUS FRACTURE SURGERY    . LAPAROSCOPIC HYSTERECTOMY  2006  . torn rotator cuff    . WRIST FRACTURE SURGERY  2005   seconday shoulder 2001    Assessment & Plan Clinical Impression: Patient is a 83 y.o. year old female with recent admission to the hospital on 04/03/2017 with increasing right sided weakness. Cranial CTreviewed, showingno acute intracranial process. Patient did not receive TPA. MRI revealed subacute enhancing 4 mm left parietal infarct. Subacute small left parietal white matter infarct. Small bifrontal and right parietal infarctsinMCA territories.  Patient transferred to CIR on 04/05/2017 .   Patient currently requires max with mobility secondary to muscle weakness, abnormal tone and decreased coordination, decreased memory and decreased standing balance, decreased postural control, hemiplegia and decreased balance strategies.  Prior to hospitalization, patient was supervision with mobility and lived with Spouse in a House home.  Home access is 2Stairs to enter.  Patient will benefit from skilled PT intervention to maximize safe functional mobility, minimize  fall risk and decrease caregiver burden for planned discharge home with 24 hour supervision.  Anticipate patient will benefit from follow up OP at discharge.  PT - End of Session Activity Tolerance: Tolerates 30+ min activity with multiple rests Endurance Deficit: Yes PT Assessment Rehab Potential (ACUTE/IP ONLY): Good PT Barriers to  Discharge: Inaccessible home environment PT Barriers to Discharge Comments: 2 steps to enter without rails PT Patient demonstrates impairments in the following area(s): Balance;Endurance;Safety;Motor;Edema PT Transfers Functional Problem(s): Bed Mobility;Bed to Chair;Car;Furniture PT Locomotion Functional Problem(s): Ambulation;Wheelchair Mobility;Stairs PT Plan PT Intensity: Minimum of 1-2 x/day ,45 to 90 minutes PT Frequency: 5 out of 7 days PT Duration Estimated Length of Stay: 10 days PT Treatment/Interventions: Ambulation/gait training;Community reintegration;DME/adaptive equipment instruction;Neuromuscular re-education;Stair training;UE/LE Strength taining/ROM;Wheelchair propulsion/positioning;UE/LE Coordination activities;Therapeutic Activities;Pain management;Discharge planning;Balance/vestibular training;Functional electrical stimulation;Cognitive remediation/compensation;Functional mobility training;Patient/family education;Splinting/orthotics;Therapeutic Exercise PT Transfers Anticipated Outcome(s): supervision PT Locomotion Anticipated Outcome(s): supervision PT Recommendation Follow Up Recommendations: Home health PT;Outpatient PT Patient destination: Home Equipment Recommended: To be determined  Skilled Therapeutic Intervention Pt participated in skilled PT eval and was educated on PT POC and goals.  Pt performed stand pivot transfers throughout session with mod A with manual facilitation at hips and pelvis to prevent posterior lean and encourage anterior wt shift.  Sit to stand blocked practice with facilitation for anterior wt shift with improvement with repetition.  Gait with RW with min A x 45'.  Stair negotiation with mod A to ascend stairs with 1 railing, +2 total A to descend stairs due to posterior lean.  Simulated car transfer to sedan height car with mod A, mod A for transfer to simulated bed in ADL apartment.  Pt given new w/c which she states is similar to her w/c at home.   Pt left in room with needs at hand and husband present.  PT Evaluation Precautions/Restrictions Precautions Precautions: Fall Precaution Comments: R sided weakness Restrictions Weight Bearing Restrictions: No Pain Pain Assessment Pain Assessment: No/denies pain Home Living/Prior Functioning Home Living Available Help at Discharge: Family;Available 24 hours/day Type of Home: House Home Access: Stairs to enter [employer] of Steps: 2 Entrance Stairs-Rails: None(post to hold on to) Home Layout: Two level;Able to live on main level with bedroom/bathroom Additional Comments: was receiving OP therapy until June  Lives With: Spouse Prior Function Level of Independence: Needs assistance with ADLs;Requires assistive device for independence  Able to Take Stairs?: Yes Driving: No Vocation: Retired Comments: husband assists with all activities except for a cleaning person who comes every other week.   Cognition Overall Cognitive Status: Impaired/Different from baseline Arousal/Alertness: Awake/alert Orientation Level: Oriented X4 Memory: Impaired Safety/Judgment: Appears intact Comments: overall slowed processing  Sensation Sensation Light Touch: Appears Intact Proprioception: Appears Intact Coordination Gross Motor Movements are Fluid and Coordinated: No Fine Motor Movements are Fluid and Coordinated: No Coordination and Movement Description: bilat UE tremors, Rt UE weakness and increased tone Finger Nose Finger Test: decreased gross motor with RUE Motor  Motor Motor: Hemiplegia;Abnormal tone;Abnormal postural alignment and control Motor - Skilled Clinical Observations: kyphosis, Rt hemiplegia   Trunk/Postural Assessment  Cervical Assessment Cervical Assessment: (fwd head) Thoracic Assessment Thoracic Assessment: (kyphosis) Lumbar Assessment Lumbar Assessment: (decreased lordosis) Postural Control Postural Control: Deficits on evaluation Righting Reactions:  decreased, delayed  Balance Static Standing Balance Static Standing - Comment/# of Minutes: strong posterior lean requiring mod/max A to correct Dynamic Standing Balance Dynamic Standing - Comments: total A without UE support Extremity Assessment  RUE Assessment RUE Assessment: Exceptions to WFL(shoulder flexion limited to  90*, elbow flexion WFL, extension to 20*, loose gross grasp) LUE Assessment LUE Assessment: Within Functional Limits RLE Assessment RLE Assessment: (grossly 3-/5) LLE Assessment LLE Assessment: (grossly 3/5)   See Function Navigator for Current Functional Status.   Refer to Care Plan for Long Term Goals  Recommendations for other services: None   Discharge Criteria: Patient will be discharged from PT if patient refuses treatment 3 consecutive times without medical reason, if treatment goals not met, if there is a change in medical status, if patient makes no progress towards goals or if patient is discharged from hospital.  The above assessment, treatment plan, treatment alternatives and goals were discussed and mutually agreed upon: by patient  [facility] 04/06/2017, 3:22 PM

## 2017-04-06 NOTE — Progress Notes (Signed)
[place] PHYSICAL MEDICINE & REHABILITATION     PROGRESS NOTE  Subjective/Complaints:  Pt seen sitting up in bed this AM.  She slept well overnight.  She is appreciative for being in rehab and is ready to start therapies.   ROS: Denies CP, SOB, N/V/D.  Objective: Vital Signs: Blood pressure 126/74, pulse 66, temperature 98.3 F (36.8 C), temperature source Oral, resp. rate 17, height 4\' 11"  (1.499 m), weight 42.5 kg (93 lb 9.6 oz), SpO2 96 %. No results found. Recent Labs    04/05/17 1528 04/06/17 0526  WBC 7.5 7.2  HGB 11.0* 11.0*  HCT 33.8* 33.8*  PLT 191 204   Recent Labs    04/03/17 2214 04/03/17 2223 04/05/17 1528 04/06/17 0526  NA 139 143  --  140  K 3.6 3.6  --  3.7  CL 111 108  --  110  GLUCOSE 109* 111*  --  77  BUN 23* 23*  --  15  CREATININE 0.77 0.70 0.68 0.62  CALCIUM 8.7*  --   --  8.7*   CBG (last 3)  Recent Labs    04/05/17 1705 04/05/17 2053 04/06/17 0633  GLUCAP 99 100* 77    Wt Readings from Last 3 Encounters:  04/05/17 42.5 kg (93 lb 9.6 oz)  04/04/17 44.2 kg (97 lb 8 oz)  02/16/17 45.1 kg (99 lb 6.4 oz)    Physical Exam:  BP 126/74 (BP Location: Left Arm)   Pulse 66   Temp 98.3 F (36.8 C) (Oral)   Resp 17   Ht 4\' 11"  (1.499 m)   Wt 42.5 kg (93 lb 9.6 oz)   SpO2 96%   BMI 18.90 kg/m  Constitutional: She appears well-developed. Frail.  HENT: Normocephalic and atraumatic.  Eyes: EOM are normal. No discharge.  Cardiovascular: Normal rate, regular rhythm. No JVD.  Respiratory: Effort normal and breath sounds normal. GI: Bowel sounds are normal. She exhibits no distension.  Musculoskeletal: She exhibits no edema or tenderness.  Neurological: She is alert and oriented.  Makes good eye contact with examiner.  Follows commands.  Right facial weakness Motor: LUE/LLE: 4-/5 proximal to distal RUE: 3+/5 proximal to distal.  Increased tone. RLE: 4-/5 proximal to distal (weaker than left) Skin: Skin is warm and dry.  Psychiatric:  She has a normal mood and affect. Her behavior is normal. Thought content normal  Assessment/Plan: 1. Functional deficits secondary to bilateral infarcts with history of CVA with spastic hemiplegia which require 3+ hours per day of interdisciplinary therapy in a comprehensive inpatient rehab setting. Physiatrist is providing close team supervision and 24 hour management of active medical problems listed below. Physiatrist and rehab team continue to assess barriers to discharge/monitor patient progress toward functional and medical goals.  Function:  Bathing Bathing position      Bathing parts      Bathing assist        Upper Body Dressing/Undressing Upper body dressing                    Upper body assist        Lower Body Dressing/Undressing Lower body dressing                                  Lower body assist        Toileting Toileting          Toileting assist  Transfers Chair/bed Company secretarytransfer             Locomotion Ambulation           Wheelchair          Cognition Comprehension Comprehension assist level: Follows complex conversation/direction with extra time/assistive device  Expression Expression assist level: Expresses complex ideas: With extra time/assistive device  Social Interaction Social Interaction assist level: Interacts appropriately with others - No medications needed.  Problem Solving Problem solving assist level: Solves complex problems: With extra time  Memory      Medical Problem List and Plan: 1.  Increased right-sided weakness secondary to multiple bilateral infarcts as well as history of left MCA infarction September 2017 with right-sided residual weakness   Begin CIR 2.  DVT Prophylaxis/Anticoagulation: Subcutaneous Lovenox. Monitor platelet counts in any signs of bleeding 3. Pain Management: Tylenol as needed 4. Mood: Prozac 20 mg daily 5. Neuropsych: This patient is capable of making decisions on her  own behalf. 6. Skin/Wound Care: Routine skin checks 7. Fluids/Electrolytes/Nutrition: Routine I&O's 8.Pre- diabetes. Hemoglobin A1c 5.6.SSI 9. Hypertension. Patient on no antihypertensive medications prior to admission.   Monitor with increased mobility 10. Diastolic congestive heart failure. Monitor for any signs of fluid overload Filed Weights   04/05/17 1509  Weight: 42.5 kg (93 lb 9.6 oz)  11. Hyperlipidemia. Lipitor 12. Overactive bladder.    Ditropan 5 mg daily.    PVRs suggesting retention, will cont to monitor and consider d/cing medication 13. Trigeminal neuralgia. Topamax 50 mg twice a day 14. Hypoalbuminemia   Supplement initiated 11/13 15. Anemia of chronic disease   Hb 11.0 on 11/13   Cont to monitor  LOS (Days) 1 A FACE TO FACE EVALUATION WAS PERFORMED  Ankit Karis Jubanil Patel 04/06/2017 10:02 AM

## 2017-04-07 ENCOUNTER — Inpatient Hospital Stay (HOSPITAL_COMMUNITY): Payer: Medicare Other | Admitting: Occupational Therapy

## 2017-04-07 ENCOUNTER — Inpatient Hospital Stay (HOSPITAL_COMMUNITY): Payer: Medicare Other | Admitting: Physical Therapy

## 2017-04-07 ENCOUNTER — Inpatient Hospital Stay (HOSPITAL_COMMUNITY): Payer: Medicare Other

## 2017-04-07 ENCOUNTER — Inpatient Hospital Stay (HOSPITAL_COMMUNITY): Payer: Medicare Other | Admitting: Speech Pathology

## 2017-04-07 DIAGNOSIS — Z8679 Personal history of other diseases of the circulatory system: Secondary | ICD-10-CM

## 2017-04-07 DIAGNOSIS — R7303 Prediabetes: Secondary | ICD-10-CM

## 2017-04-07 DIAGNOSIS — N319 Neuromuscular dysfunction of bladder, unspecified: Secondary | ICD-10-CM

## 2017-04-07 LAB — GLUCOSE, CAPILLARY: Glucose-Capillary: 94 mg/dL (ref 65–99)

## 2017-04-07 NOTE — Progress Notes (Signed)
Speech Language Pathology Daily Session Note  Patient Details  Name: Dan HumphreysBennie H Timm MRN: [id number] Date of Birth: 03-11-1928  Today's Date: 04/07/2017 SLP Individual Time: 1135-1200 SLP Individual Time Calculation (min): 25 min  Short Term Goals: Week 1: SLP Short Term Goal 1 (Week 1): STG=LTG due to ELOS  Skilled Therapeutic Interventions:  Pt was seen for skilled ST targeting cognitive goals.  SLP facilitated the session with money management tasks to address memory goals and ongoing diagnostic treatment of problem solving.  Pt needed min assist verbal cues for working memory to complete mental math calculations.  Pt was left in bed with bed alarm set and call bell within reach.  Continue per current plan of care.   Function:  Eating Eating                 Cognition Comprehension Comprehension assist level: Follows complex conversation/direction with extra time/assistive device  Expression   Expression assist level: Expresses complex ideas: With extra time/assistive device  Social Interaction Social Interaction assist level: Interacts appropriately with others - No medications needed.  Problem Solving Problem solving assist level: Solves basic 75 - 89% of the time/requires cueing 10 - 24% of the time  Memory Memory assist level: Recognizes or recalls 75 - 89% of the time/requires cueing 10 - 24% of the time    Pain Pain Assessment Pain Assessment: No/denies pain Pain Score: 0-No pain  Therapy/Group: Individual Therapy  Josephina Melcher, Melanee SpryNicole L 04/07/2017, 12:01 PM

## 2017-04-07 NOTE — Progress Notes (Signed)
Occupational Therapy Session Note  Patient Details  Name: Kelly [name] MRN: [id number] Date of Birth: 1927-09-09  Today's Date: 04/07/2017 OT Individual Time: 1100-1117 OT Individual Time Calculation (min): 17 min  and Today's Date: 04/07/2017 OT Missed Time: 13 Minutes Missed Time Reason: Pain   Short Term Goals: Week 1:  OT Short Term Goal 1 (Week 1): Pt will complete toilet transfer with min assist with LRAD OT Short Term Goal 2 (Week 1): Pt will complete LB dressing with supervision at sit > stand level OT Short Term Goal 3 (Week 1): Pt will complete bathing with min assist at sit > stand level OT Short Term Goal 4 (Week 1): Pt will complete tub/shower transfer with tub bench with min assist OT Short Term Goal 5 (Week 1): Pt will complete 2 grooming tasks in standing   Skilled Therapeutic Interventions/Progress Updates:    Upon entering the room, pt supine in bed with c/o back pain. Pt reports, " I just got into bed and this is the only thing that is helping my back. " OT discussed other strategies for pain management and pt requesting heating pad for back. RN notified. Pt oriented x 4 this session. Pt refusing OT intervention secondary to back pain. Pt remained supine with call bell and all needed items within reach.   Therapy Documentation Precautions:  Precautions Precautions: Fall Precaution Comments: R sided weakness Restrictions Weight Bearing Restrictions: No General: General OT Amount of Missed Time: 13 Minutes Vital Signs:  Pain: Pain Assessment Pain Assessment: No/denies pain Pain Score: 0-No pain  See Function Navigator for Current Functional Status.   Therapy/Group: Individual Therapy  Alen BleacherBradsher, [name] 04/07/2017, 11:18 AM

## 2017-04-07 NOTE — Progress Notes (Signed)
Discontinue blood sugar checks and sliding scale insulin at family's request. Hemoglobin A1c 5.6.

## 2017-04-07 NOTE — Progress Notes (Signed)
Team Conference noted today. Pt admitted for CVA with noted bilat hand tremors. Continent of B/B. Denies any pain at this time, but c/o being cold. Room thermostat = 75 and  2 more blankets provided and effective. Pt tolerated meds better this shift with them whole in puree. Skin intact. Able to make needs known. New order for protsat liquid protein and educated on benefits based on identified Diagnosis and PMH and for diet supplementation. Able to make needs known. Safety maintained. Callbell within reach. Will continue to monitor.

## 2017-04-07 NOTE — Progress Notes (Signed)
[place] PHYSICAL MEDICINE & REHABILITATION     PROGRESS NOTE  Subjective/Complaints:  Pt seen laying in bed this AM.  She slept well overnight.  She feels she did well in therapies yesterday and feels better overall.   ROS: Denies CP, SOB, N/V/D.  Objective: Vital Signs: Blood pressure (!) 116/57, pulse 65, temperature 97.8 F (36.6 C), temperature source Oral, resp. rate 16, height 4\' 11"  (1.499 m), weight 42.5 kg (93 lb 9.6 oz), SpO2 97 %. No results found. Recent Labs    04/05/17 1528 04/06/17 0526  WBC 7.5 7.2  HGB 11.0* 11.0*  HCT 33.8* 33.8*  PLT 191 204   Recent Labs    04/05/17 1528 04/06/17 0526  NA  --  140  K  --  3.7  CL  --  110  GLUCOSE  --  77  BUN  --  15  CREATININE 0.68 0.62  CALCIUM  --  8.7*   CBG (last 3)  Recent Labs    04/06/17 1635 04/06/17 2037 04/07/17 0623  GLUCAP 119* 93 94    Wt Readings from Last 3 Encounters:  04/05/17 42.5 kg (93 lb 9.6 oz)  04/04/17 44.2 kg (97 lb 8 oz)  02/16/17 45.1 kg (99 lb 6.4 oz)    Physical Exam:  BP (!) 116/57 (BP Location: Right Arm)   Pulse 65   Temp 97.8 F (36.6 C) (Oral)   Resp 16   Ht 4\' 11"  (1.499 m)   Wt 42.5 kg (93 lb 9.6 oz)   SpO2 97%   BMI 18.90 kg/m  Constitutional: She appears well-developed. Frail.  HENT: Normocephalic and atraumatic.  Eyes: EOM are normal. No discharge.  Cardiovascular: RRR. No JVD.  Respiratory: Effort normal and breath sounds normal. GI: Bowel sounds are normal. She exhibits no distension.  Musculoskeletal: She exhibits no edema or tenderness.  Neurological: She is alert and oriented.  Makes good eye contact with examiner.  Follows commands.  Right facial weakness Motor: LUE/LLE: 4-/5 proximal to distal RUE: 3+/5 proximal to distal.  Increased tone. RLE: 4-/5 proximal to distal (weaker than left, improving) Skin: Skin is warm and dry.  Psychiatric: She has a normal mood and affect. Her behavior is normal. Thought content  normal  Assessment/Plan: 1. Functional deficits secondary to bilateral infarcts with history of CVA with spastic hemiplegia which require 3+ hours per day of interdisciplinary therapy in a comprehensive inpatient rehab setting. Physiatrist is providing close team supervision and 24 hour management of active medical problems listed below. Physiatrist and rehab team continue to assess barriers to discharge/monitor patient progress toward functional and medical goals.  Function:  Bathing Bathing position   Position: Wheelchair/chair at sink  Bathing parts Body parts bathed by patient: Right arm, Left arm, Chest, Abdomen, Front perineal area, Right upper leg, Left upper leg Body parts bathed by helper: Buttocks, Right lower leg, Left lower leg, Back  Bathing assist Assist Level: (Mod assist)      Upper Body Dressing/Undressing Upper body dressing   What is the patient wearing?: Pull over shirt/dress     Pull over shirt/dress - Perfomed by patient: Thread/unthread right sleeve, Thread/unthread left sleeve, Put head through opening Pull over shirt/dress - Perfomed by helper: Pull shirt over trunk        Upper body assist Assist Level: (Min assist)      Lower Body Dressing/Undressing Lower body dressing   What is the patient wearing?: Pants, Socks, Shoes     Pants- Performed by  patient: Thread/unthread right pants leg, Thread/unthread left pants leg Pants- Performed by helper: Pull pants up/down       Socks - Performed by helper: Don/doff right sock, Don/doff left sock Shoes - Performed by patient: Don/doff right shoe, Don/doff left shoe, Fasten right, Fasten left            Lower body assist Assist for lower body dressing: (Mod assist)      Toileting Toileting   Toileting steps completed by patient: Performs perineal hygiene Toileting steps completed by helper: Adjust clothing prior to toileting, Performs perineal hygiene, Adjust clothing after toileting Toileting  Assistive Devices: Grab bar or rail  Toileting assist Assist level: Touching or steadying assistance (Pt.75%)   Transfers Chair/bed transfer     Chair/bed transfer assist level: Touching or steadying assistance (Pt > 75%) Chair/bed transfer assistive device: Bedrails, Armrests     Locomotion Ambulation     Max distance: 45 Assist level: Touching or steadying assistance (Pt > 75%)   Wheelchair   Type: Manual   Assist Level: Total assistance (Pt < 25%)  Cognition Comprehension Comprehension assist level: Follows complex conversation/direction with extra time/assistive device  Expression Expression assist level: Expresses complex ideas: With extra time/assistive device  Social Interaction Social Interaction assist level: Interacts appropriately with others - No medications needed.  Problem Solving Problem solving assist level: Solves basic 90% of the time/requires cueing < 10% of the time  Memory Memory assist level: Recognizes or recalls 75 - 89% of the time/requires cueing 10 - 24% of the time    Medical Problem List and Plan: 1.  Increased right-sided weakness secondary to multiple bilateral infarcts as well as history of left MCA infarction September 2017 with right-sided residual weakness   Cont CIR 2.  DVT Prophylaxis/Anticoagulation: Subcutaneous Lovenox. Monitor platelet counts in any signs of bleeding 3. Pain Management: Tylenol as needed 4. Mood: Prozac 20 mg daily 5. Neuropsych: This patient is capable of making decisions on her own behalf. 6. Skin/Wound Care: Routine skin checks 7. Fluids/Electrolytes/Nutrition: Routine I&O's 8.Pre- diabetes. Hemoglobin A1c 5.6.SSI   Controlled on 11/14 9. Hypertension. Patient on no antihypertensive medications prior to admission.   Controlled 11/14 10. Diastolic congestive heart failure. Monitor for any signs of fluid overload Filed Weights   04/05/17 1509  Weight: 42.5 kg (93 lb 9.6 oz)  11. Hyperlipidemia. Lipitor 12.  Overactive bladder.    Ditropan 5 mg daily.    PVRs inconsistent, will cont to monitor 13. Trigeminal neuralgia. Topamax 50 mg twice a day 14. Hypoalbuminemia   Supplement initiated 11/13 15. Anemia of chronic disease   Hb 11.0 on 11/13   Cont to monitor  LOS (Days) 2 A FACE TO FACE EVALUATION WAS PERFORMED  Oswin Johal Karis Jubanil Savaya Hakes 04/07/2017 9:04 AM

## 2017-04-07 NOTE — Progress Notes (Addendum)
Physical Therapy Note  Patient Details  Name: Dan HumphreysBennie H Claus MRN: [id number] Date of Birth: 11-30-1927 Today's Date: 04/07/2017    Time: 830-930 60 minutes  1:1 No c/o pain.  Pt performed supine to sit with supervision, increased time. Pt able to doff and don shirt with set up assistance.  Standing for LB dressing/undressing with min A to stand, mod A to don/doff pants.  Stand pivot transfers with RW to w/c and toilet with min A, total A for clothing management for toileting.  Sit to stand blocked practice with initial min A, progressing to supervision throughout session.  Gait with RW 100' x 2 with min A, 50' with min A. Pt able to improve anterior wt shifts with sit to stand and carry over during session.  Time 2: 1400-1428 28 minutes  1:1 No c/o pain.  Gait with RW on carpet 2 x 1475' with close supervision.  Gait on tile x 50' with RW supervision.  Standing balance/reaching activity with supervision/min A.  Pt's family present for treatment and pleased with progress   Espen Bethel 04/07/2017, 9:32 AM

## 2017-04-07 NOTE — Progress Notes (Signed)
Occupational Therapy Session Note  Patient Details  Name: Kelly [name] MRN: [id number] Date of Birth: 1927/07/20  Today's Date: 04/07/2017 OT Individual Time: 1305-1400 OT Individual Time Calculation (min): 55 min    Short Term Goals: Week 1:  OT Short Term Goal 1 (Week 1): Pt will complete toilet transfer with min assist with LRAD OT Short Term Goal 2 (Week 1): Pt will complete LB dressing with supervision at sit > stand level OT Short Term Goal 3 (Week 1): Pt will complete bathing with min assist at sit > stand level OT Short Term Goal 4 (Week 1): Pt will complete tub/shower transfer with tub bench with min assist OT Short Term Goal 5 (Week 1): Pt will complete 2 grooming tasks in standing   Skilled Therapeutic Interventions/Progress Updates:    Treatment session with focus on sit <> stand and standing posture.  Pt received upright in w/c finishing lunch.  Engaged in sit <> stand in therapy gym to complete card matching activity.  Increased challenge to reaching up and outside BOS to Lt to facilitate weight shift, as pt demonstrates tendency to maintain flexed posture with weight shift to Rt.  Utilized RUE as gross assist to slide cards across table and then engaged in reaching with LUE.  Pt required frequent rest breaks due to fatigue and back pain.  Provided pt with specialty back for w/c to increase posture and provide back support.  Passed off to PT.  Therapy Documentation Precautions:  Precautions Precautions: Fall Precaution Comments: R sided weakness Restrictions Weight Bearing Restrictions: No General:   Vital Signs: Therapy Vitals Temp: 98.7 F (37.1 C) Temp Source: Oral Pulse Rate: 87 Resp: 18 BP: (!) 92/58 Patient Position (if appropriate): Sitting Oxygen Therapy SpO2: 98 % O2 Device: Not Delivered Pain: Pain Assessment Pain Assessment: No/denies pain  See Function Navigator for Current Functional Status.   Therapy/Group: Individual Therapy  Kelly [name],  Kelly [name] 04/07/2017, 3:32 PM

## 2017-04-08 ENCOUNTER — Inpatient Hospital Stay (HOSPITAL_COMMUNITY): Payer: Medicare Other | Admitting: Speech Pathology

## 2017-04-08 ENCOUNTER — Inpatient Hospital Stay (HOSPITAL_COMMUNITY): Payer: Medicare Other | Admitting: Occupational Therapy

## 2017-04-08 ENCOUNTER — Inpatient Hospital Stay (HOSPITAL_COMMUNITY): Payer: Medicare Other

## 2017-04-08 DIAGNOSIS — S32040A Wedge compression fracture of fourth lumbar vertebra, initial encounter for closed fracture: Secondary | ICD-10-CM

## 2017-04-08 NOTE — Progress Notes (Signed)
Occupational Therapy Note  Patient Details  Name: Kelly [name] MRN: [id number] Date of Birth: 1928-03-16  Today's Date: 04/08/2017 OT Individual Time: 1035-1105 OT Individual Time Calculation (min): 30 min   No c/o pain.  At start of session, pt sitting on toilet.  She cleansed self, stood up with bar with touching A and adjusted her pants, transferred to w/c with steadying A.  Washed hands at sink.  Pt discussed her desire to improve RUE and feels her grasp is weaker than it has been.  Worked on PROM of scapula to work into tone inhibiting stretches for her R wrist/ finger flexors. Gentle cross friction massage over palmar finger flexor tendons.   Resisted sh flex/ext with push pulls 15x , resisted tricep extension 15x.  Pt is exhibiting 3+ in sh (within her AROM of 50 degrees) and 4- in triceps.  Pt worked on self rom with clasped hands for PNF patterns of shoulder to opposite knee 15 x in each direction and self ROM of wrist ext.    Provided pt with green foam block for her to work on grasp strength.  Pt resting in wc with all needs met.  Beatrice 04/08/2017, 12:38 PM

## 2017-04-08 NOTE — Progress Notes (Signed)
Speech Language Pathology Discharge Summary  Patient Details  Name: Kelly [name] MRN: [id number] Date of Birth: [birth year]  Today's Date: 04/08/2017 SLP Individual Time: 0802-0842 SLP Individual Time Calculation (min): 40 min   Skilled Therapeutic Interventions:  Pt was seen for skilled ST targeting communication goals and family education with pt's husband.  Pt was able to recall names of therapists, specific activities of yesterday's therapy sessions, and results of imaging with mod I.  No word finding deficits were evident during conversations with therapist.  Suspect pt's overall improved clinical presentation is a function of improved alertness as pt was fatigued during evaluation.  Both pt and husband are in agreement that pt is back to baseline for cognition and communication.  Discussed memory compensatory strategies to maximize functional independence at home.  All questions were answered to their satisfaction at this time.  Given that pt is at baseline for cognition and communication, no further ST needs are indicated at this time . Pt left in wheelchair with husband at bedside.      Patient has met 2 of 2 long term goals.  Patient to discharge at overall Modified Independent;Supervision level.  Reasons goals not met:     Clinical Impression/Discharge Summary:  Pt has made functional gains and is discharging having met 2 out of 2 long term goals.  Pt is mod I-supervision for cognitive-linguistic function which is her baseline per pt and family's report.  Pt's husband has been providing assist for medication and financial management at baseline and is willing and able to provide recommended level of support once pt is discharged home.  As a result, no further ST needs are indicated at this time.    Care Partner:  Caregiver Able to Provide Assistance: Yes  Type of Caregiver Assistance: Physical;Cognitive  Recommendation:  None      Equipment: none recommended by SLP    Reasons  for discharge: Treatment goals met       Function:  Eating Eating                 Cognition Comprehension Comprehension assist level: Follows complex conversation/direction with extra time/assistive device  Expression   Expression assist level: Expresses complex ideas: With extra time/assistive device  Social Interaction Social Interaction assist level: Interacts appropriately with others - No medications needed.  Problem Solving Problem solving assist level: Solves basic 90% of the time/requires cueing < 10% of the time  Memory Memory assist level: Recognizes or recalls 90% of the time/requires cueing < 10% of the time   Windell Moulding L 04/08/2017, 8:57 AM

## 2017-04-08 NOTE — Progress Notes (Signed)
Social Work Patient ID: Kelly HumphreysBennie H Grassia, female   DOB: 13-Jun-1927, 83 y.o.   MRN: [id number]     Deatra InaHoyle, Davinder Haff R, LCSW  Social Worker  General Practice  Patient Care Conference  Signed  Date of Service:  04/08/2017  1:30 PM          Signed          [] Hide copied text  [] Hover for details   Inpatient RehabilitationTeam Conference and Plan of Care Update Date: 04/07/2017   Time: 2:45  PM      Patient Name: Kelly [name]      Medical Record Number: [id number]  Date of Birth: 13-Jun-1927 Sex: Female         Room/Bed: 4W23C/4W23C-01 Payor Info: Payor: MEDICARE / Plan: MEDICARE PART A AND B / Product Type: *No Product type* /     Admitting Diagnosis: L CVA  Admit Date/Time:  04/05/2017  2:29 PM Admission Comments: No comment available    Primary Diagnosis:  <principal problem not specified> Principal Problem: <principal problem not specified>       Patient Active Problem List    Diagnosis Date Noted  . Closed compression fracture of L4 lumbar vertebra (HCC)    . Neurogenic bladder    . History of hypertension    . Anemia of chronic disease    . Hypoalbuminemia due to protein-calorie malnutrition (HCC)    . Acute bilateral cerebral infarction in a watershed distribution 04/05/2017  . Benign essential HTN    . Chronic diastolic heart failure (HCC)    . Right spastic hemiparesis (HCC)    . History of stroke 02/16/2017  . Abnormality of gait following cerebrovascular accident (CVA) 02/16/2017  . Slow transit constipation    . Acute blood loss anemia    . Adjustment disorder with depressed mood    . Cerebrovascular accident (CVA) (HCC) 02/03/2016  . Dysarthria, post-stroke    . Dysphagia, post-stroke    . Leukocytosis    . Prediabetes    . Right hemiparesis (HCC)    . Cerebral infarction due to unspecified mechanism    . Other secondary hypertension    . Overactive bladder    . Diastolic dysfunction    . Hyperlipidemia    . Essential hypertension    . Essential  tremor    . Stroke (cerebrum) (HCC) 02/01/2016  . Facial droop due to stroke 02/01/2016  . Essential and other specified forms of tremor 02/20/2013  . Dysphonia 02/20/2013  . Trigeminal neuralgia 02/20/2013      Expected Discharge Date: Expected Discharge Date: 04/13/17   Team Members Present: Physician leading conference: Dr. Maryla MorrowAnkit Patel Social Worker Present: Amada JupiterLucy Windsor Goeken, LCSW Nurse Present: Kennyth ArnoldStacey Jennings, RN PT Present: Judieth KeensKaren Donaworth, PT OT Present: Rosalio LoudSarah Hoxie, OT SLP Present: Jackalyn LombardNicole Page, SLP PPS Coordinator present : Tora DuckMarie Noel, RN, CRRN       Current Status/Progress Goal Weekly Team Focus  Medical     Increased right-sided weakness secondary to multiple bilateral infarcts as well as history of left MCA infarction September 2017 with right-sided residual weakness  Improve mobility, safety, self-care, bladder meds  See above   Bowel/Bladder     Continent of B/B. LBM 04/07/17  Min assist  Remain continent of B/B   Swallow/Nutrition/ Hydration     carb modified diet with meds whole in puree  decrease risk of aspiration in swallowing pills      ADL's     minimal assist with ADL care  maintain  independence and sequencing ability  standing balance, transfers, RUE NMR   Mobility     minimal assist x 1  maintain independence  balance, transfers, gait, family ed   Communication     supervision   mod I   high level word finding    Safety/Cognition/ Behavioral Observations   min-supervision   supervision   semi-complex cognition    Pain     c/o back pain. 650mg  tylenol q 4hr prn  <2 on a 0-10 pain scale  assess pain q 4hr and prn   Skin     CDI  no breakdown while on rehab  assess q shift and prn     Rehab Goals Patient on target to meet rehab goals: Yes *See Care Plan and progress notes for long and short-term goals.      Barriers to Discharge   Current Status/Progress Possible Resolutions Date Resolved   Physician     Medical stability     See above  Therapies,  follow CBGs, HTN, optimize bladder meds      Nursing                 PT  Inaccessible home environment  2 steps to enter without rails              OT                 SLP            SW              Discharge Planning/Teaching Needs:  Home with elderly spouse who can prvode 24/7 support but not heavy lifting.  ongoing   Team Discussion:  Some retaining urine but is cont b/b; BP;  Stopped BS checks.  Family concerned abotu back pain - MD aware and to f/u with them.  Supervision/ min assist overall and team feels she can reach a baseline level of supervision.  Doing well.  Revisions to Treatment Plan:  None    Continued Need for Acute Rehabilitation Level of Care: The patient requires daily medical management by a physician with specialized training in physical medicine and rehabilitation for the following conditions: Daily direction of a multidisciplinary physical rehabilitation program to ensure safe treatment while eliciting the highest outcome that is of practical value to the patient.: Yes Daily medical management of patient stability for increased activity during participation in an intensive rehabilitation regime.: Yes Daily analysis of laboratory values and/or radiology reports with any subsequent need for medication adjustment of medical intervention for : Neurological problems;Urological problems;Blood pressure problems   Kara Mierzejewski 04/08/2017, 1:30 PM

## 2017-04-08 NOTE — Progress Notes (Signed)
Physical Therapy Note  Patient Details  Name: Kelly [name] MRN: [id number] Date of Birth: [birth year] Today's Date: 04/08/2017  0800-0900, 60 min individual tx Pain: 5/10 upper back; pt and husband believe this is from fall 1 month ago; she took Ibuprofen qd for it at home; Kelly BrunnerAundria, RN notified  Sit>< stand from w/c  x 2 for doffing PJ pants and donning pants with assistance; pt used L hand but needed assistance for R.  R hand hypertonus is limiting for functional use.  W/c propulsion using hemi method on level tile and carpet, superviison.  neuromuscular re-education via forced use and multimodal cues for alternating reciprocal movement x 4 extremities, at level 4 x 5 min.  Pt liked the machine and will see if her gym has one like it, when she starts back there.  Exercise rated light. Sustained stretch bil hamstrings and heel cords in standing on wedge with bil UE support, x 1 minute, x 1.5 min.  Pt unable to bring hips over feet.  Seated bil glut sets with upright trunk to promote hip ext in standing.   Therapeutic activities to promote trunk shortening/lengthening/rotating to facilitate balance reactions: seated on wedge to reduce posterior pelvic tilt- reaching across midline with R/L hands to retrieve cards and match on bard in front of her, using R hand to wipe down equipment while wearing glove.    Gait with grocery cart over level tile, min assist, with mod cues for upright trunk, forward gaze.  R knee hyperextends slightly due to R foot drop.   Pt left resting in w/c with all needs within reach.  See function navigator for current status.  Jarrad Mclees 04/08/2017, 7:59 AM

## 2017-04-08 NOTE — Progress Notes (Signed)
Occupational Therapy Session Note  Patient Details  Name: Kelly [name] MRN: [id number] Date of Birth: 1927-05-31  Today's Date: 04/08/2017 OT Individual Time: 1302-1400 OT Individual Time Calculation (min): 58 min    Short Term Goals: Week 1:  OT Short Term Goal 1 (Week 1): Pt will complete toilet transfer with min assist with LRAD OT Short Term Goal 2 (Week 1): Pt will complete LB dressing with supervision at sit > stand level OT Short Term Goal 3 (Week 1): Pt will complete bathing with min assist at sit > stand level OT Short Term Goal 4 (Week 1): Pt will complete tub/shower transfer with tub bench with min assist OT Short Term Goal 5 (Week 1): Pt will complete 2 grooming tasks in standing    Skilled Therapeutic Interventions/Progress Updates:    Upon entering the room, pt seated in wheelchair with husband present in the room. Pt with no c/o pain this session. Pt requesting to use bathroom. OT assisted pt into bathroom via wheelchair. Stand pivot transfer from wheelchair to toilet with use of grab bar and min verbal cues for technique. Pt attempting to have BM but unable. Pt requires min A for standing balance and increased time for LB clothing management before returning to wheelchair. OT assisted pt to gym for time management. Pt standing with RW for dynavision reaching tasks. Pt's standing balance ranging from min - mod A and reaching L <> R with targets appearing in 3 rings closest to center. Pt able to hit 21 targets with average reaction time of 2.86 seconds. Pt utilized R UE for same reaching task while seated and bottom half of board utilized only. Pt used hand over hand as needed for far L and R reaching. Pt able to hit 11 targets within 1 minute with an average reaction time of 5.45 seconds. Pt returned to wheelchair and assisted back to room with call bell and all needs within reach.  Therapy Documentation Precautions:  Precautions Precautions: Fall Precaution Comments: R  sided weakness Restrictions Weight Bearing Restrictions: No General:   Vital Signs: Therapy Vitals Temp: 98.2 F (36.8 C) Temp Source: Oral Pulse Rate: 68 Resp: 18 BP: 114/64 Patient Position (if appropriate): Lying Oxygen Therapy SpO2: 96 % O2 Device: Not Delivered Pain:   ADL:   Vision   Perception    Praxis   Exercises:   Other Treatments:    See Function Navigator for Current Functional Status.   Therapy/Group: Individual Therapy  Alen BleacherBradsher, Luke Falero P 04/08/2017, 4:08 PM

## 2017-04-08 NOTE — Care Management (Signed)
[facility]patient [facility] Individual Statement of Services  Patient Name:  Kelly [name]  Date:  04/08/2017  Welcome to the [facility]patient [facility].  Our goal is to provide you with an individualized program based on your diagnosis and situation, designed to meet your specific needs.  With this comprehensive rehabilitation program, you will be expected to participate in at least 3 hours of rehabilitation therapies Monday-Friday, with modified therapy programming on the weekends.  Your rehabilitation program will include the following services:  Physical Therapy (PT), Occupational Therapy (OT), Speech Therapy (ST), 24 hour per day rehabilitation nursing, Therapeutic Recreaction (TR), Neuropsychology, Case Management (Social Worker), Rehabilitation Medicine, Nutrition Services and Pharmacy Services  Weekly team conferences will be held on Wednesdays to discuss your progress.  Your Social Worker will talk with you frequently to get your input and to update you on team discussions.  Team conferences with you and your family in attendance may also be held.  Expected length of stay: 7-10 days  Overall anticipated outcome: supervision  Depending on your progress and recovery, your program may change. Your Social Worker will coordinate services and will keep you informed of any changes. Your Social Worker's name and contact numbers are listed  below.  The following services may also be recommended but are not provided by the Inpatient Rehabilitation Center:   Driving Evaluations  Home Health Rehabiltiation Services  Outpatient Rehabilitation Services    Arrangements will be made to provide these services after discharge if needed.  Arrangements include referral to agencies that provide these services.  Your insurance has been verified to be:  Medicare and [place] [facility] Your primary doctor is:  [facility]  Pertinent information will be shared with your doctor and your insurance  company.  Social Worker:  FentonLucy Suede [place], [place] [phone] or [phone]   Information discussed with and copy given to patient by: Amada JupiterHOYLE, [name], 04/08/2017, 1:19 PM

## 2017-04-08 NOTE — Progress Notes (Signed)
Social Work  Social Work Assessment and Plan  Patient Details  Name: Kelly [name] MRN: [id number] Date of Birth: May 28, 1927  Today's Date: 04/08/2017  Problem List:  Patient Active Problem List   Diagnosis Date Noted  . Closed compression fracture of L4 lumbar vertebra (HCC)   . Neurogenic bladder   . History of hypertension   . Anemia of chronic disease   . Hypoalbuminemia due to protein-calorie malnutrition (HCC)   . Acute bilateral cerebral infarction in a watershed distribution 04/05/2017  . Benign essential HTN   . Chronic diastolic heart failure (HCC)   . Right spastic hemiparesis (HCC)   . History of stroke 02/16/2017  . Abnormality of gait following cerebrovascular accident (CVA) 02/16/2017  . Slow transit constipation   . Acute blood loss anemia   . Adjustment disorder with depressed mood   . Cerebrovascular accident (CVA) (HCC) 02/03/2016  . Dysarthria, post-stroke   . Dysphagia, post-stroke   . Leukocytosis   . Prediabetes   . Right hemiparesis (HCC)   . Cerebral infarction due to unspecified mechanism   . Other secondary hypertension   . Overactive bladder   . Diastolic dysfunction   . Hyperlipidemia   . Essential hypertension   . Essential tremor   . Stroke (cerebrum) (HCC) 02/01/2016  . Facial droop due to stroke 02/01/2016  . Essential and other specified forms of tremor 02/20/2013  . Dysphonia 02/20/2013  . Trigeminal neuralgia 02/20/2013   Past Medical History:  Past Medical History:  Diagnosis Date  . Acute blood loss anemia   . Adjustment disorder with depressed mood   . CVA (cerebral vascular accident) (HCC) 02/03/2016   Linear infarct within the posterior limb of left internal capsule  . Diastolic CHF (HCC)   . Dysphagia   . Dysphonia 02/20/2013  . Essential and other specified forms of tremor 02/20/2013  . HLD (hyperlipidemia)   . HTN (hypertension)   . OAB (overactive bladder)   . Osteoporosis   . Patient receiving subcutaneous heparin     For DVT prophylaxis 9/17  . Prediabetes   . Psoriasis   . Slow transit constipation   . Stroke (cerebrum) (HCC) 02/01/2016  . Trigeminal neuralgia 02/20/2013   Past Surgical History:  Past Surgical History:  Procedure Laterality Date  . APPENDECTOMY  1940  . bladder tack    . CATARACT EXTRACTION Bilateral   . HEMORRHOID SURGERY  1960  . HUMERUS FRACTURE SURGERY    . LAPAROSCOPIC HYSTERECTOMY  2006  . torn rotator cuff    . WRIST FRACTURE SURGERY  2005   seconday shoulder 2001   Social History:  reports that  has never smoked. she has never used smokeless tobacco. She reports that she drinks alcohol. She reports that she does not use drugs.  Family / Support Systems Marital Status: Married How Long?: 12 yrs (this is a Optometristre-marriage for them) Spouse/Significant Other: "Kelly" [name] @ (C) [phone] Children: pt's daughter, Kelly ArchCarey [name] (lives next door to pt) @ (H) [phone] Anticipated Caregiver: spouse, "Kelly" 89 years old and self sufficient Ability/Limitations of Caregiver: able to provide light assistance but could not management much more than that. Caregiver Availability: 24/7 Family Dynamics: [name] and very supportive.  He confirms that pt's daughter is next door and supportive, however, notes "but she's not the 'nurturing nursing' type."  Social History Preferred language: English Religion: Presbyterian Cultural Background: NA Read: Yes Write: Yes Employment Status: Retired Fish farm managerLegal Hisotry/Current Legal Issues: None Guardian/Conservator: None -  per MD, pt is capable of making decisions on her own behalf.   Abuse/Neglect Abuse/Neglect Assessment Can Be Completed: Yes Physical Abuse: Denies Verbal Abuse: Denies Sexual Abuse: Denies Exploitation of patient/patient's resources: Denies Self-Neglect: Denies  Emotional Status Pt's affect, behavior adn adjustment status: Very pleasant woman who is able to complete the assessment easily and spouse  adds information as needed.  She does feel she is "starting (recovery) over" from prior CVA.  She also notes significant back pain and feels this is affected progress.  She denies any significant emotional distress, however, will monitor during stay and refer for neuropsychology consult as indicated. Recent Psychosocial Issues: prior CVA and CIR [facility] Sept [facility] Pyschiatric History: None Substance Abuse History: None  Patient / Family Perceptions, Expectations & Goals Pt/Family understanding of illness & functional limitations: Pt and spouse with good understanding of her new CVA and new functional limitations/ need for CIR Premorbid pt/family roles/activities: Pt was using a rw PTA and "moving slowly" per spouse.  Spouse was providing most home management support, driving and ADLs as needed. Anticipated changes in roles/activities/participation: Dependent on gains made with this CIR stay.  Hope to reach a level close to "baseline".  Husband prepared to resume caregiver support. Pt/family expectations/goals: Pt and spouse hopeful she can return to level she had reached prior to this CVA.  [employer] Community Agencies: None Premorbid Home Care/DME Agencies: Other (Comment)(HH via [facility] Transportation available at discharge: yes Resource referrals recommended: Neuropsychology  Discharge Planning Living Arrangements: Spouse/significant other Support Systems: Spouse/significant other, Children Type of Residence: Private residence Insurance Resources: [employer]are, Media pl[employer] Insurance (specify)([place]) Financial Resources: Social Security Financial Screen Referred: No Living Expenses: Own Money Management: Spouse Does the patient have any problems obtaining your medications?: No Home Management: spouse plus housekeeper 1x/wk Patient/Family Preliminary Plans: Pt to return home with spouse as primary support. Expected  length of stay: ELOS 10- 14 days  Clinical Impression Very pleasant, frail apperaing, elderly woman returning to CIR after another CVA (here Sept 2017).  She is very happy to be able to return to CIR and hopeful she will make gains as she did last time.  Elderly spouse is at bedside and very supportive.  He is in good health and able to provide 24/7 support, however, could not provide heavy assist if needed.  Pt denies any s/s of emotional distress.  Will follow for support and d/c planning needs.  Kelly [name] 04/08/2017, 1:16 PM

## 2017-04-08 NOTE — Progress Notes (Signed)
[facility] PH[facility]ICINE [facility]ABILITATION     PROGRESS NOTE  Subjective/Complaints:  Pt seen laying in bed this AM.  She slept well overnight. She has questions about her xray results.  Had prolonged conversation with patient and husband earlier regarding films, progress, discharge date.   ROS: Denies CP, SOB, N/V/D.  Objective: Vital Signs: Blood pressure 106/60, pulse 72, temperature (!) 97.5 F (36.4 C), temperature source Oral, resp. rate 18, height 4\' 11"  (1.499 m), weight 42.5 kg (93 lb 9.6 oz), SpO2 97 %. Dg Lumbar Spine 2-3 Views  Result Date: 04/07/2017 CLINICAL DATA:  Low back pain secondary to a fall. EXAM: LUMBAR SPINE - 2-3 VIEW COMPARISON:  None. FINDINGS: There is a lumbar scoliosis with convexity to the right centered at L2-3. There is a compression deformity of the L4 vertebral body, age indeterminate. There is degenerative disc disease from T12-L1 through L4-5. There is sparing of the L5-S1 disc space. No bone destruction. There is moderate degenerative facet arthritis at L4-5 and L5-S1. Incidental note is made of aortic atherosclerosis. IMPRESSION: 1. Compression deformity of the L4 vertebra, age indeterminate. 2. Diffuse degenerative disc disease with sparing at L5-S1. Lumbar scoliosis. 3. Aortic atherosclerosis. Electronically Signed   By: Francene BoyersJames  Maxwell M.D.   On: 04/07/2017 17:04   Recent Labs    04/05/17 1528 04/06/17 0526  WBC 7.5 7.2  HGB 11.0* 11.0*  HCT 33.8* 33.8*  PLT 191 204   Recent Labs    04/05/17 1528 04/06/17 0526  NA  --  140  K  --  3.7  CL  --  110  GLUCOSE  --  77  BUN  --  15  CREATININE 0.68 0.62  CALCIUM  --  8.7*   CBG (last 3)  Recent Labs    04/06/17 1635 04/06/17 2037 04/07/17 0623  GLUCAP 119* 93 94    Wt Readings from Last 3 Encounters:  04/05/17 42.5 kg (93 lb 9.6 oz)  04/04/17 44.2 kg (97 lb 8 oz)  02/16/17 45.1 kg (99 lb 6.4 oz)    Physical Exam:  BP 106/60 (BP Location: Left Arm)   Pulse 72   Temp (!)  97.5 F (36.4 C) (Oral)   Resp 18   Ht 4\' 11"  (1.499 m)   Wt 42.5 kg (93 lb 9.6 oz)   SpO2 97%   BMI 18.90 kg/m  Constitutional: She appears well-developed. Frail.  HENT: Normocephalic and atraumatic.  Eyes: EOM are normal. No discharge.  Cardiovascular: RRR. No JVD.  Respiratory: Effort normal and breath sounds normal. GI: Bowel sounds are normal. She exhibits no distension.  Musculoskeletal: She exhibits no edema or tenderness.  Neurological: She is alert and oriented.  Makes good eye contact with examiner.  Follows commands.  Right facial weakness Motor: LUE/LLE: 4-/5 proximal to distal RUE: 3+/5 proximal to distal.  Increased tone. RLE: 4-/5 proximal to distal (weaker than left, stable) Skin: Skin is warm and dry.  Psychiatric: She has a normal mood and affect. Her behavior is normal. Thought content normal  Assessment/Plan: 1. Functional deficits secondary to bilateral infarcts with history of CVA with spastic hemiplegia which require 3+ hours per day of interdisciplinary therapy in a comprehensive inpatient rehab setting. Physiatrist is providing close team supervision and 24 hour management of active medical problems listed below. Physiatrist and rehab team continue to assess barriers to discharge/monitor patient progress toward functional and medical goals.  Function:  Bathing Bathing position   Position: Wheelchair/chair at sink  Bathing  parts Body parts bathed by patient: Right arm, Left arm, Chest, Abdomen, Front perineal area, Right upper leg, Left upper leg Body parts bathed by helper: Buttocks, Right lower leg, Left lower leg, Back  Bathing assist Assist Level: (Mod assist)      Upper Body Dressing/Undressing Upper body dressing   What is the patient wearing?: Pull over shirt/dress     Pull over shirt/dress - Perfomed by patient: Thread/unthread right sleeve, Thread/unthread left sleeve, Put head through opening Pull over shirt/dress - Perfomed by helper:  Pull shirt over trunk        Upper body assist Assist Level: (Min assist)      Lower Body Dressing/Undressing Lower body dressing   What is the patient wearing?: Pants, Socks, Shoes     Pants- Performed by patient: Thread/unthread right pants leg, Thread/unthread left pants leg Pants- Performed by helper: Pull pants up/down       Socks - Performed by helper: Don/doff right sock, Don/doff left sock Shoes - Performed by patient: Don/doff right shoe, Don/doff left shoe, Fasten right, Fasten left            Lower body assist Assist for lower body dressing: (Mod assist)      Toileting Toileting   Toileting steps completed by patient: Performs perineal hygiene, Adjust clothing prior to toileting Toileting steps completed by helper: Adjust clothing after toileting Toileting Assistive Devices: Grab bar or rail  Toileting assist Assist level: More than reasonable time   Transfers Chair/bed transfer     Chair/bed transfer assist level: Touching or steadying assistance (Pt > 75%) Chair/bed transfer assistive device: Bedrails, Armrests     Locomotion Ambulation     Max distance: 45 Assist level: Touching or steadying assistance (Pt > 75%)   Wheelchair   Type: Manual   Assist Level: Total assistance (Pt < 25%)  Cognition Comprehension Comprehension assist level: Follows complex conversation/direction with extra time/assistive device  Expression Expression assist level: Expresses complex ideas: With extra time/assistive device  Social Interaction Social Interaction assist level: Interacts appropriately with others - No medications needed.  Problem Solving Problem solving assist level: Solves basic 90% of the time/requires cueing < 10% of the time  Memory Memory assist level: Recognizes or recalls 90% of the time/requires cueing < 10% of the time    Medical Problem List and Plan: 1.  Increased right-sided weakness secondary to multiple bilateral infarcts as well as  history of left MCA infarction September 2017 with right-sided residual weakness   Cont CIR 2.  DVT Prophylaxis/Anticoagulation: Subcutaneous Lovenox. Monitor platelet counts in any signs of bleeding 3. Pain Management: Tylenol as needed 4. Mood: Prozac 20 mg daily 5. Neuropsych: This patient is capable of making decisions on her own behalf. 6. Skin/Wound Care: Routine skin checks 7. Fluids/Electrolytes/Nutrition: Routine I&O's 8.Pre- diabetes. Hemoglobin A1c 5.6.SSI   Controlled on 11/14 9. Hypertension. Patient on no antihypertensive medications prior to admission.   Running low on 11/15 10. Diastolic congestive heart failure. Monitor for any signs of fluid overload Filed Weights   04/05/17 1509  Weight: 42.5 kg (93 lb 9.6 oz)  11. Hyperlipidemia. Lipitor 12. Neurogenic bladder.    Ditropan 5 mg daily d/ced 11/15.    PVRs inconsistent, will cont to monitor 13. Trigeminal neuralgia. Topamax 50 mg twice a day 14. Hypoalbuminemia   Supplement initiated 11/13 15. Anemia of chronic disease   Hb 11.0 on 11/13   Cont to monitor 16. Lumbar compression fracture   CXR reviewed with subtle abnormalities,  Lumbar xrays ordered, reviewed, age indeterminate fracture, reviewed with patient  >35 minutes spent with patient and husband, with >30 minutes discussing/counseling back injury, plan for imaging, reviewing of imaging, and discharge plans  LOS (Days) 3 A FACE TO FACE EVALUATION WAS PERFORMED  Gisella Alwine Karis Juba 04/08/2017 8:51 AM

## 2017-04-08 NOTE — Patient Care Conference (Signed)
Inpatient RehabilitationTeam Conference and Plan of Care Update Date: 04/07/2017   Time: 2:45  PM    Patient Name: Kelly [name]      Medical Record Number: [id number]  Date of Birth: 1928/03/27 Sex: Female         Room/Bed: 4W23C/4W23C-01 Payor Info: Payor: MEDICARE / Plan: MEDICARE PART A AND B / Product Type: *No Product type* /    Admitting Diagnosis: L CVA  Admit Date/Time:  04/05/2017  2:29 PM Admission Comments: No comment available   Primary Diagnosis:  <principal problem not specified> Principal Problem: <principal problem not specified>  Patient Active Problem List   Diagnosis Date Noted  . Closed compression fracture of L4 lumbar vertebra (HCC)   . Neurogenic bladder   . History of hypertension   . Anemia of chronic disease   . Hypoalbuminemia due to protein-calorie malnutrition (HCC)   . Acute bilateral cerebral infarction in a watershed distribution 04/05/2017  . Benign essential HTN   . Chronic diastolic heart failure (HCC)   . Right spastic hemiparesis (HCC)   . History of stroke 02/16/2017  . Abnormality of gait following cerebrovascular accident (CVA) 02/16/2017  . Slow transit constipation   . Acute blood loss anemia   . Adjustment disorder with depressed mood   . Cerebrovascular accident (CVA) (HCC) 02/03/2016  . Dysarthria, post-stroke   . Dysphagia, post-stroke   . Leukocytosis   . Prediabetes   . Right hemiparesis (HCC)   . Cerebral infarction due to unspecified mechanism   . Other secondary hypertension   . Overactive bladder   . Diastolic dysfunction   . Hyperlipidemia   . Essential hypertension   . Essential tremor   . Stroke (cerebrum) (HCC) 02/01/2016  . Facial droop due to stroke 02/01/2016  . Essential and other specified forms of tremor 02/20/2013  . Dysphonia 02/20/2013  . Trigeminal neuralgia 02/20/2013    Expected Discharge Date: Expected Discharge Date: 04/13/17  Team Members Present: Physician leading conference: Dr. Maryla MorrowAnkit  Patel Social Worker Present: Amada JupiterLucy Hayven Fatima, LCSW Nurse Present: Kennyth ArnoldStacey Jennings, RN PT Present: Judieth KeensKaren Donaworth, PT OT Present: Rosalio LoudSarah Hoxie, OT SLP Present: Jackalyn LombardNicole Page, SLP PPS Coordinator present : Tora DuckMarie Noel, RN, CRRN     Current Status/Progress Goal Weekly Team Focus  Medical   Increased right-sided weakness secondary to multiple bilateral infarcts as well as history of left MCA infarction September 2017 with right-sided residual weakness  Improve mobility, safety, self-care, bladder meds  See above   Bowel/Bladder   Continent of B/B. LBM 04/07/17  Min assist  Remain continent of B/B   Swallow/Nutrition/ Hydration   carb modified diet with meds whole in puree  decrease risk of aspiration in swallowing pills      ADL's   minimal assist with ADL care  maintain independence and sequencing ability  standing balance, transfers, RUE NMR   Mobility   minimal assist x 1  maintain independence  balance, transfers, gait, family ed   Communication   supervision   mod I   high level word finding    Safety/Cognition/ Behavioral Observations  min-supervision   supervision   semi-complex cognition    Pain   c/o back pain. 650mg  tylenol q 4hr prn  <2 on a 0-10 pain scale  assess pain q 4hr and prn   Skin   CDI  no breakdown while on rehab  assess q shift and prn    Rehab Goals Patient on target to meet rehab goals: Yes *See Care Plan  and progress notes for long and short-term goals.     Barriers to Discharge  Current Status/Progress Possible Resolutions Date Resolved   Physician    Medical stability     See above  Therapies, follow CBGs, HTN, optimize bladder meds      Nursing                  PT  Inaccessible home environment  2 steps to enter without rails              OT                  SLP                SW                Discharge Planning/Teaching Needs:  Home with elderly spouse who can prvode 24/7 support but not heavy lifting.  ongoing   Team Discussion:  Some  retaining urine but is cont b/b; BP;  Stopped BS checks.  Family concerned abotu back pain - MD aware and to f/u with them.  Supervision/ min assist overall and team feels she can reach a baseline level of supervision.  Doing well.  Revisions to Treatment Plan:  None    Continued Need for Acute Rehabilitation Level of Care: The patient requires daily medical management by a physician with specialized training in physical medicine and rehabilitation for the following conditions: Daily direction of a multidisciplinary physical rehabilitation program to ensure safe treatment while eliciting the highest outcome that is of practical value to the patient.: Yes Daily medical management of patient stability for increased activity during participation in an intensive rehabilitation regime.: Yes Daily analysis of laboratory values and/or radiology reports with any subsequent need for medication adjustment of medical intervention for : Neurological problems;Urological problems;Blood pressure problems  Tru Rana 04/08/2017, 1:30 PM

## 2017-04-08 NOTE — Progress Notes (Signed)
Social Work Patient ID: Kelly [name], female   DOB: 14-Feb-1928, 83 y.o.   MRN: [id number]   Met with pt and spouse following team conference.  Both aware and agreeable with targeted goals of supervision and d/c date of 04/13/17.  Husband reiterates that he really needs pt close to her baseline function and he is prepared to resume care.  Will decide closer to d/c if they want to have [place] or OP follow up.  Continue to follow.  [name], LCSW

## 2017-04-08 NOTE — Progress Notes (Signed)
Pt resting in bed quietly. Easily aroused. Denies any pain at this time. Continent of b/b. Assist x 1 and weakness is noted. bilat hand tremors noted when holding fluids in the cup. Swallows pills whole in puree and educated on the types of puree that would assist in swallowing pills. Able to make needs known. callbell within reach. Will continue to monitor.

## 2017-04-09 ENCOUNTER — Inpatient Hospital Stay (HOSPITAL_COMMUNITY): Payer: Medicare Other | Admitting: Physical Therapy

## 2017-04-09 ENCOUNTER — Inpatient Hospital Stay (HOSPITAL_COMMUNITY): Payer: Medicare Other

## 2017-04-09 DIAGNOSIS — S32040S Wedge compression fracture of fourth lumbar vertebra, sequela: Secondary | ICD-10-CM

## 2017-04-09 DIAGNOSIS — T1490XA Injury, unspecified, initial encounter: Secondary | ICD-10-CM

## 2017-04-09 LAB — URINALYSIS, COMPLETE (UACMP) WITH MICROSCOPIC
Bilirubin Urine: NEGATIVE
Glucose, UA: NEGATIVE mg/dL
Ketones, ur: 5 mg/dL — AB
Leukocytes, UA: NEGATIVE
Nitrite: NEGATIVE
Protein, ur: 30 mg/dL — AB
Specific Gravity, Urine: 1.02 (ref 1.005–1.030)
pH: 6 (ref 5.0–8.0)

## 2017-04-09 NOTE — Progress Notes (Signed)
Physical Therapy Note  Patient Details  Name: Dan HumphreysBennie H Fleer MRN: [id number] Date of Birth: 1928-05-22 Today's Date: 04/09/2017    Time: [phone] 50 minutes  1:1 no c/o pain.  Pt requests to get dressed. Pt able to perform upper and lower body dressing with set up assist with supervision for sit to stand and supervision for balance with donning/doffing pants.  Pt needs total A for donning shoes.  Gait with RW with supervision 50' x 2, pt with increased foot drag on Rt when fatigued. Pt is aware of fatigue and able to verbalize that she needs to rest.  nustep per pt request x 8 minutes level 4 for UE/LE strength and coordination.  Pt states she is pleased with progress and is excited to go home next week.   Xeng Kucher 04/09/2017, 10:21 AM

## 2017-04-09 NOTE — Progress Notes (Signed)
Physical Therapy Session Note  Patient Details  Name: Kelly [name] MRN: [id number] Date of Birth: 06/14/1927  Today's Date: 04/09/2017 PT Individual Time: 0805-0900 PT Individual Time Calculation (min): 55 min   Short Term Goals: Week 1:  PT Short Term Goal 1 (Week 1): STG=LTG  Skilled Therapeutic Interventions/Progress Updates:   Pt received sitting in bed and agreeable to PT. Stand pivot transfer to [facility] with min assist from PT using RW.   Sitting balance/trunk activation  on 3in wedge to force extension:  forward reach with BUE x 10 and lateral reaches with additional trunk extension x 8 BUE  Gait training with RW x 19f with min-supervision assist pt noted to have improved trunk extension and adequate foot clearance throughout gait training. [facility] with supervision assist and RW.   Standing tolerance to play checkers with LEU at high/low table to encourage trunk and cervical extension. Pt required RUE support and RW and supervision throughout standing balance activities x 10 minutes. Supervision assist also provided for [facility]<>[facility] returned to room and [facility] reach and all needs met.        Therapy Documentation Precautions:  Precautions Precautions: Fall Precaution Comments: R sided weakness Restrictions Weight Bearing Restrictions: No    Vital Signs: Therapy Vitals Temp: 98.1 F (36.7 C) Temp Source: Oral Pulse Rate: 66 Resp: 16 BP: 120/63 Patient Position (if appropriate): Lying Oxygen Therapy SpO2: 96 % O2 Device: Not Delivered Pain: 0/10   See Function Navigator for Current Functional Status.   Therapy/Group: Individual Therapy  ALorie Phenix11/16/2018, 9:06 AM

## 2017-04-09 NOTE — Progress Notes (Signed)
Occupational Therapy Session Note  Patient Details  Name: Kelly [name] MRN: [id number] Date of Birth: [birth year]  Today's Date: 04/09/2017 OT Individual Time: 9450-3888 OT Individual Time Calculation (min): 42 min    Short Term Goals: Week 1:  OT Short Term Goal 1 (Week 1): Pt will complete toilet transfer with min assist with LRAD OT Short Term Goal 2 (Week 1): Pt will complete LB dressing with supervision at sit > stand level OT Short Term Goal 3 (Week 1): Pt will complete bathing with min assist at sit > stand level OT Short Term Goal 4 (Week 1): Pt will complete tub/shower transfer with tub bench with min assist OT Short Term Goal 5 (Week 1): Pt will complete 2 grooming tasks in standing   Skilled Therapeutic Interventions/Progress Updates:    1;1. Pt participates in UB bathing from chair with increased time and forced use of RUE wit HOH A to bring hand up to sink to assist with ringing out wash cloth. OT provides A to wash back. Pt dons pull over shirt with 1 VC for orientation. Pt stands with min A to brush teeth with demonstratrional cues to use RUE aas a stabilizer for toothbrush. Pt stand pivot transfer w/c to toilet with min A and cues for stabilizing on grab bar with RUE for weight bearing. Exited session with pt seated in w/c with call light in reach and all needs met.  Therapy Documentation Precautions:  Precautions Precautions: Fall Precaution Comments: R sided weakness Restrictions Weight Bearing Restrictions: No Other Treatments:    See Function Navigator for Current Functional Status.   Therapy/Group: Individual Therapy  Tonny Branch 04/09/2017, 12:03 PM

## 2017-04-09 NOTE — Progress Notes (Signed)
Occupational Therapy Session Note  Patient Details  Name: JAQUELYNE FIRKUS MRN: [id number] Date of Birth: [birth year]  Today's Date: 04/09/2017 OT Individual Time: 1300-1357 OT Individual Time Calculation (min): 57 min    Short Term Goals: Week 1:  OT Short Term Goal 1 (Week 1): Pt will complete toilet transfer with min assist with LRAD OT Short Term Goal 2 (Week 1): Pt will complete LB dressing with supervision at sit > stand level OT Short Term Goal 3 (Week 1): Pt will complete bathing with min assist at sit > stand level OT Short Term Goal 4 (Week 1): Pt will complete tub/shower transfer with tub bench with min assist OT Short Term Goal 5 (Week 1): Pt will complete 2 grooming tasks in standing   Skilled Therapeutic Interventions/Progress Updates:    Pt and husband present at beginning of session with no c/o pain. Ot propels w/c to dayroom for time management. Pt ambulates with RW with min A to/from family room to make cup of coffee with question cues to gather all needed items prior to starting activity. Pt requires VC for fully erect posture. Pt learns rules to game of dominoes and plays at sit to stand level while weight bearing into RUE. Pt requires max fading to mod VC for rule following. Exited session with pt semi reclined in bed with call light in reach and all needs met.   Therapy Documentation Precautions:  Precautions Precautions: Fall Precaution Comments: R sided weakness Restrictions Weight Bearing Restrictions: No  See Function Navigator for Current Functional Status.   Therapy/Group: Individual Therapy  Tonny Branch 04/09/2017, 4:04 PM

## 2017-04-09 NOTE — Progress Notes (Signed)
[place] PHYSICAL MEDICINE & REHABILITATION     PROGRESS NOTE  Subjective/Complaints:  Pt seen laying in bed this AM.  She slept well overnight.  She states she needs to learn how to don her WHO.   ROS: Denies CP, SOB, N/V/D.  Objective: Vital Signs: Blood pressure 120/63, pulse 66, temperature 98.1 F (36.7 C), temperature source Oral, resp. rate 16, height 4\' 11"  (1.499 m), weight 42.5 kg (93 lb 9.6 oz), SpO2 96 %. Dg Lumbar Spine 2-3 Views  Result Date: 04/07/2017 CLINICAL DATA:  Low back pain secondary to a fall. EXAM: LUMBAR SPINE - 2-3 VIEW COMPARISON:  None. FINDINGS: There is a lumbar scoliosis with convexity to the right centered at L2-3. There is a compression deformity of the L4 vertebral body, age indeterminate. There is degenerative disc disease from T12-L1 through L4-5. There is sparing of the L5-S1 disc space. No bone destruction. There is moderate degenerative facet arthritis at L4-5 and L5-S1. Incidental note is made of aortic atherosclerosis. IMPRESSION: 1. Compression deformity of the L4 vertebra, age indeterminate. 2. Diffuse degenerative disc disease with sparing at L5-S1. Lumbar scoliosis. 3. Aortic atherosclerosis. Electronically Signed   By: Francene BoyersJames  Maxwell M.D.   On: 04/07/2017 17:04   No results for input(s): WBC, HGB, HCT, PLT in the last 72 hours. No results for input(s): NA, K, CL, GLUCOSE, BUN, CREATININE, CALCIUM in the last 72 hours.  Invalid input(s): CO CBG (last 3)  Recent Labs    04/06/17 1635 04/06/17 2037 04/07/17 0623  GLUCAP 119* 93 94    Wt Readings from Last 3 Encounters:  04/05/17 42.5 kg (93 lb 9.6 oz)  04/04/17 44.2 kg (97 lb 8 oz)  02/16/17 45.1 kg (99 lb 6.4 oz)    Physical Exam:  BP 120/63 (BP Location: Right Arm)   Pulse 66   Temp 98.1 F (36.7 C) (Oral)   Resp 16   Ht 4\' 11"  (1.499 m)   Wt 42.5 kg (93 lb 9.6 oz)   SpO2 96%   BMI 18.90 kg/m  Constitutional: She appears well-developed. Frail.  HENT: Normocephalic and  atraumatic.  Eyes: EOM are normal. No discharge.  Cardiovascular: RRR. No JVD.  Respiratory: Effort normal and breath sounds normal. GI: Bowel sounds are normal. She exhibits no distension.  Musculoskeletal: She exhibits no edema or tenderness.  Neurological: She is alert and oriented.  Makes good eye contact with examiner.  Follows commands.  Right facial weakness Motor: LUE/LLE: 4-/5 proximal to distal RUE: 3+/5 proximal to distal.  Increased tone (slightly improved). RLE: 4-/5 proximal to distal (weaker than left, unchanged) Skin: Skin is warm and dry.  Psychiatric: She has a normal mood and affect. Her behavior is normal. Thought content normal  Assessment/Plan: 1. Functional deficits secondary to bilateral infarcts with history of CVA with spastic hemiplegia which require 3+ hours per day of interdisciplinary therapy in a comprehensive inpatient rehab setting. Physiatrist is providing close team supervision and 24 hour management of active medical problems listed below. Physiatrist and rehab team continue to assess barriers to discharge/monitor patient progress toward functional and medical goals.  Function:  Bathing Bathing position   Position: Wheelchair/chair at sink  Bathing parts Body parts bathed by patient: Right arm, Left arm, Chest, Abdomen, Front perineal area, Right upper leg, Left upper leg Body parts bathed by helper: Buttocks, Right lower leg, Left lower leg, Back  Bathing assist Assist Level: (Mod assist)      Upper Body Dressing/Undressing Upper body dressing  What is the patient wearing?: Pull over shirt/dress     Pull over shirt/dress - Perfomed by patient: Thread/unthread right sleeve, Thread/unthread left sleeve, Put head through opening Pull over shirt/dress - Perfomed by helper: Pull shirt over trunk        Upper body assist Assist Level: (Min assist)      Lower Body Dressing/Undressing Lower body dressing   What is the patient wearing?:  Pants, Socks, Shoes     Pants- Performed by patient: Thread/unthread right pants leg, Thread/unthread left pants leg Pants- Performed by helper: Pull pants up/down       Socks - Performed by helper: Don/doff right sock, Don/doff left sock Shoes - Performed by patient: Don/doff right shoe, Don/doff left shoe, Fasten right, Fasten left            Lower body assist Assist for lower body dressing: (Mod assist)      Toileting Toileting   Toileting steps completed by patient: Performs perineal hygiene Toileting steps completed by helper: Adjust clothing after toileting Toileting Assistive Devices: Grab bar or rail  Toileting assist Assist level: Touching or steadying assistance (Pt.75%)   Transfers Chair/bed transfer   Chair/bed transfer method: Stand pivot Chair/bed transfer assist level: Moderate assist (Pt 50 - 74%/lift or lower) Chair/bed transfer assistive device: Armrests     Locomotion Ambulation     Max distance: 75 Assist level: Touching or steadying assistance (Pt > 75%)   Wheelchair   Type: Manual Max wheelchair distance: 150 Assist Level: Supervision or verbal cues  Cognition Comprehension Comprehension assist level: Follows complex conversation/direction with extra time/assistive device  Expression Expression assist level: Expresses complex ideas: With extra time/assistive device  Social Interaction Social Interaction assist level: Interacts appropriately with others - No medications needed.  Problem Solving Problem solving assist level: Solves basic 90% of the time/requires cueing < 10% of the time  Memory Memory assist level: Recognizes or recalls 90% of the time/requires cueing < 10% of the time    Medical Problem List and Plan: 1.  Increased right-sided weakness secondary to multiple bilateral infarcts as well as history of left MCA infarction September 2017 with right-sided residual weakness   Cont CIR 2.  DVT Prophylaxis/Anticoagulation: Subcutaneous  Lovenox. Monitor platelet counts in any signs of bleeding 3. Pain Management: Tylenol as needed 4. Mood: Prozac 20 mg daily 5. Neuropsych: This patient is capable of making decisions on her own behalf. 6. Skin/Wound Care: Routine skin checks 7. Fluids/Electrolytes/Nutrition: Routine I&O's 8.Pre- diabetes. Hemoglobin A1c 5.6.SSI   Controlled on 11/14 9. Hypertension. Patient on no antihypertensive medications prior to admission.   Controlled 11/16 10. Diastolic congestive heart failure. Monitor for any signs of fluid overload Filed Weights   04/05/17 1509  Weight: 42.5 kg (93 lb 9.6 oz)  11. Hyperlipidemia. Lipitor 12. Neurogenic bladder.    Ditropan 5 mg daily d/ced 11/15.    PVRs inconsistent, will cont to monitor   UA ordered 13. Trigeminal neuralgia. Topamax 50 mg twice a day 14. Hypoalbuminemia   Supplement initiated 11/13 15. Anemia of chronic disease   Hb 11.0 on 11/13   Cont to monitor 16. Lumbar compression fracture   CXR reviewed with subtle abnormalities, Lumbar xrays ordered, reviewed, age indeterminate fracture, reviewed with patient   Conservative management  LOS (Days) 4 A FACE TO FACE EVALUATION WAS PERFORMED  Ankit Karis Jubanil Patel 04/09/2017 8:40 AM

## 2017-04-10 ENCOUNTER — Inpatient Hospital Stay (HOSPITAL_COMMUNITY): Payer: Medicare Other | Admitting: Occupational Therapy

## 2017-04-10 ENCOUNTER — Inpatient Hospital Stay (HOSPITAL_COMMUNITY): Payer: Medicare Other

## 2017-04-10 DIAGNOSIS — R8290 Unspecified abnormal findings in urine: Secondary | ICD-10-CM

## 2017-04-10 DIAGNOSIS — R0989 Other specified symptoms and signs involving the circulatory and respiratory systems: Secondary | ICD-10-CM

## 2017-04-10 NOTE — Progress Notes (Signed)
Physical Therapy Session Note  Patient Details  Name: Kelly HumphreysBennie H Vanhorne MRN: [id number] Date of Birth: 1927/07/15  Today's Date: 04/10/2017 PT Individual Time: [id number] PT Individual Time Calculation (min): 41 min   Short Term Goals: Week 1:  PT Short Term Goal 1 (Week 1): STG=LTG  Skilled Therapeutic Interventions/Progress Updates:    Session focused on functional gait training with RW in controlled and simulated community environment (up/down ramp and over mulched surface with RW) with improve R foot clearance noted throughout and occasional min assist for balance during turns (up to mod assist on mulched surface at times otherwise min), functional transfers with RW with facilitation for anterior weighshift requiring up to min assist for power up, and addressed toileting needs in room including transfers and and dynamic standing balance. Pt expressed fatigue from therapies today and declined any further activities requesting to return to bed to take a nap. Repositioned in the bed with supervision cues for technique. All needs in reach.   Therapy Documentation Precautions:  Precautions Precautions: Fall Precaution Comments: R sided weakness Restrictions Weight Bearing Restrictions: No General: PT Amount of Missed Time (min): 19 Minutes PT Missed Treatment Reason: Patient fatigue Pain:  Denies pain.   See Function Navigator for Current Functional Status.   Therapy/Group: Individual Therapy  Karolee StampsGray, Lynkin Saini Darrol PokeBrescia  Caniyah Murley B. Adalida Garver, PT, DPT  04/10/2017, 3:29 PM

## 2017-04-10 NOTE — Progress Notes (Signed)
Occupational Therapy Session Note  Patient Details  Name: Kelly HumphreysBennie H Dresden MRN: [id number] Date of Birth: 11/14/1927  Today's Date: 04/10/2017 OT Individual Time: 0700-0800 OT Individual Time Calculation (min): 60 min    Short Term Goals: Week 1:  OT Short Term Goal 1 (Week 1): Pt will complete toilet transfer with min assist with LRAD OT Short Term Goal 2 (Week 1): Pt will complete LB dressing with supervision at sit > stand level OT Short Term Goal 3 (Week 1): Pt will complete bathing with min assist at sit > stand level OT Short Term Goal 4 (Week 1): Pt will complete tub/shower transfer with tub bench with min assist OT Short Term Goal 5 (Week 1): Pt will complete 2 grooming tasks in standing   Skilled Therapeutic Interventions/Progress Updates:    Pt resting in bed upon OT arrival and was agreeable to participating in therapy with no c/o pain.  Pt transitioned to sitting EOB with Mod A to scoot hips forward once seated.  SPT performed towards R with Min A, set up assist provided to manage some containers on breakfast tray with pt encouraged to incorporate RUE as much as possible.  Pt primarily uses RUE as a stabilizer, forced use with hand over hand assist provided during meal setup.  Pt performed UB dressing with increased time and set-up of supplies.  Pt stood with Min A and ambulated with RW to bathroom to complete toileting, doffing brief and performing perineal care with increased time and steadying A.  A provided for clothing after.  Pt threaded BLEs through pants and donned over hips with increased time.  OT assisted in donning socks d/t time constraints and pt donned shoes with SBA.  Pt remained seated in w/c with all needs in reach upon OT departure.  Therapy Documentation Precautions:  Precautions Precautions: Fall Precaution Comments: R sided weakness Restrictions Weight Bearing Restrictions: No Vital Signs: Therapy Vitals Temp: (!) 97.5 F (36.4 C) Temp Source:  Oral Pulse Rate: 67 Resp: 18 BP: (!) 143/68 Patient Position (if appropriate): Lying Oxygen Therapy SpO2: 97 % O2 Device: Not Delivered Pain: Pain Assessment Pain Assessment: No/denies pain  See Function Navigator for Current Functional Status.   Therapy/Group: Individual Therapy  Deitra Mayolexandra  Kinsie Belford 04/10/2017, 7:22 AM

## 2017-04-10 NOTE — Progress Notes (Signed)
[place] PHYSICAL MEDICINE & REHABILITATION     PROGRESS NOTE  Subjective/Complaints:  Pt seen laying in bed this AM.  She slept well overnight.  She is appreciative of her care.   ROS: Denies CP, SOB, N/V/D.  Objective: Vital Signs: Blood pressure 107/60, pulse 86, temperature 98.8 F (37.1 C), temperature source Oral, resp. rate 18, height 4\' 11"  (1.499 m), weight 42.5 kg (93 lb 9.6 oz), SpO2 100 %. No results found. No results for input(s): WBC, HGB, HCT, PLT in the last 72 hours. No results for input(s): NA, K, CL, GLUCOSE, BUN, CREATININE, CALCIUM in the last 72 hours.  Invalid input(s): CO CBG (last 3)  No results for input(s): GLUCAP in the last 72 hours.  Wt Readings from Last 3 Encounters:  04/05/17 42.5 kg (93 lb 9.6 oz)  04/04/17 44.2 kg (97 lb 8 oz)  02/16/17 45.1 kg (99 lb 6.4 oz)    Physical Exam:  BP 107/60 (BP Location: Right Arm)   Pulse 86   Temp 98.8 F (37.1 C) (Oral)   Resp 18   Ht 4\' 11"  (1.499 m)   Wt 42.5 kg (93 lb 9.6 oz)   SpO2 100%   BMI 18.90 kg/m  Constitutional: She appears well-developed. Frail.  HENT: Normocephalic and atraumatic.  Eyes: EOM are normal. No discharge.  Cardiovascular: RRR. No JVD.  Respiratory: Effort normal and breath sounds normal. GI: Bowel sounds are normal. She exhibits no distension.  Musculoskeletal: She exhibits no edema or tenderness.  Neurological: She is alert and oriented.  Makes good eye contact with examiner.  Follows commands.  Right facial weakness Motor: LUE/LLE: 4/5 proximal to distal RUE: 4/5 proximal to distal.  Increased tone  RLE: 4/5 proximal to distal (weaker than left) Skin: Skin is warm and dry.  Psychiatric: She has a normal mood and affect. Her behavior is normal. Thought content normal  Assessment/Plan: 1. Functional deficits secondary to bilateral infarcts with history of CVA with spastic hemiplegia which require 3+ hours per day of interdisciplinary therapy in a comprehensive  inpatient rehab setting. Physiatrist is providing close team supervision and 24 hour management of active medical problems listed below. Physiatrist and rehab team continue to assess barriers to discharge/monitor patient progress toward functional and medical goals.  Function:  Bathing Bathing position   Position: Wheelchair/chair at sink  Bathing parts Body parts bathed by patient: Right arm, Left arm, Chest, Abdomen, Front perineal area, Right upper leg, Left upper leg Body parts bathed by helper: Buttocks, Right lower leg, Left lower leg, Back  Bathing assist Assist Level: (Mod assist)      Upper Body Dressing/Undressing Upper body dressing   What is the patient wearing?: Pull over shirt/dress     Pull over shirt/dress - Perfomed by patient: Thread/unthread right sleeve, Thread/unthread left sleeve, Put head through opening, Pull shirt over trunk Pull over shirt/dress - Perfomed by helper: Pull shirt over trunk        Upper body assist Assist Level: (Min assist)      Lower Body Dressing/Undressing Lower body dressing   What is the patient wearing?: Pants, Socks, Shoes     Pants- Performed by patient: Thread/unthread right pants leg, Thread/unthread left pants leg Pants- Performed by helper: Pull pants up/down       Socks - Performed by helper: Don/doff right sock, Don/doff left sock Shoes - Performed by patient: Don/doff right shoe, Don/doff left shoe, Fasten right, Fasten left  Lower body assist Assist for lower body dressing: (Mod assist)      Toileting Toileting   Toileting steps completed by patient: Performs perineal hygiene Toileting steps completed by helper: Adjust clothing prior to toileting, Adjust clothing after toileting Toileting Assistive Devices: Grab bar or rail  Toileting assist Assist level: Touching or steadying assistance (Pt.75%)   Transfers Chair/bed transfer   Chair/bed transfer method: Stand pivot Chair/bed transfer assist  level: Touching or steadying assistance (Pt > 75%) Chair/bed transfer assistive device: Armrests, Patent attorneyWalker     Locomotion Ambulation     Max distance: 8580' Assist level: Touching or steadying assistance (Pt > 75%)   Wheelchair   Type: Manual Max wheelchair distance: 150 Assist Level: Supervision or verbal cues  Cognition Comprehension Comprehension assist level: Follows complex conversation/direction with extra time/assistive device  Expression Expression assist level: Expresses complex ideas: With extra time/assistive device  Social Interaction Social Interaction assist level: Interacts appropriately with others - No medications needed.  Problem Solving Problem solving assist level: Solves basic 90% of the time/requires cueing < 10% of the time  Memory Memory assist level: Recognizes or recalls 90% of the time/requires cueing < 10% of the time    Medical Problem List and Plan: 1.  Increased right-sided weakness secondary to multiple bilateral infarcts as well as history of left MCA infarction September 2017 with right-sided residual weakness   Cont CIR 2.  DVT Prophylaxis/Anticoagulation: Subcutaneous Lovenox. Monitor platelet counts in any signs of bleeding 3. Pain Management: Tylenol as needed 4. Mood: Prozac 20 mg daily 5. Neuropsych: This patient is capable of making decisions on her own behalf. 6. Skin/Wound Care: Routine skin checks 7. Fluids/Electrolytes/Nutrition: Routine I&O's 8.Pre- diabetes. Hemoglobin A1c 5.6.SSI   Controlled on 11/14 9. Hypertension. Patient on no antihypertensive medications prior to admission.   Labile, overall controlled on 11/17 10. Diastolic congestive heart failure. Monitor for any signs of fluid overload Filed Weights   04/05/17 1509  Weight: 42.5 kg (93 lb 9.6 oz)  11. Hyperlipidemia. Lipitor 12. Neurogenic bladder.    Ditropan 5 mg daily d/ced 11/15.    PVRs inconsistent, will cont to monitor   UA ?, Ucx ordered 13. Trigeminal  neuralgia. Topamax 50 mg twice a day 14. Hypoalbuminemia   Supplement initiated 11/13 15. Anemia of chronic disease   Hb 11.0 on 11/13   Cont to monitor 16. Lumbar compression fracture   CXR reviewed with subtle abnormalities, Lumbar xrays ordered, reviewed, age indeterminate fracture, reviewed with patient   Conservative management  LOS (Days) 5 A FACE TO FACE EVALUATION WAS PERFORMED  Jeramine Delis Karis Jubanil Jenesa Foresta 04/10/2017 5:41 PM

## 2017-04-10 NOTE — Progress Notes (Signed)
Occupational Therapy Session Note  Patient Details  Name: Kelly [name] MRN: [id number] Date of Birth: 21-Oct-1927  Today's Date: 04/10/2017 OT Individual Time: 2831-5176 OT Individual Time Calculation (min): 87 min    Short Term Goals: Week 1:  OT Short Term Goal 1 (Week 1): Pt will complete toilet transfer with min assist with LRAD OT Short Term Goal 2 (Week 1): Pt will complete LB dressing with supervision at sit > stand level OT Short Term Goal 3 (Week 1): Pt will complete bathing with min assist at sit > stand level OT Short Term Goal 4 (Week 1): Pt will complete tub/shower transfer with tub bench with min assist OT Short Term Goal 5 (Week 1): Pt will complete 2 grooming tasks in standing   Skilled Therapeutic Interventions/Progress Updates:    1:1. Pt ambulates to all tx destinations totaling 411 feet with 3 rest breaks with CGA fading to supervision with RW. Pt completes standing [place] activity using pushpins to match to colors on board while weight bearing throught RUE. Pt completes seated horseshoe reach activity using BUE to obtain horse shoe by crossing midline in PNF diagonals. Pt requires MOD A to reach with RUE to facilitate normal movement. Pt completes recliner transfer with MIN A for sit to stand d/t chair moving. Educated pt to have family present when getting u from home recliner as it may be lower/move. Pt completes TTB transfer at ambulatory level with VC for sequencing and CGA. Pt completes toilet transfer and toileting with CGA and Vc for use of grab bars. Exited session with pt seated in w/c with call light in reach and all needs met.  Therapy Documentation Precautions:  Precautions Precautions: Fall Precaution Comments: R sided weakness Restrictions Weight Bearing Restrictions: No  See Function Navigator for Current Functional Status.   Therapy/Group: Individual Therapy  Tonny Branch 04/10/2017, 6:55 AM

## 2017-04-11 DIAGNOSIS — G500 Trigeminal neuralgia: Secondary | ICD-10-CM

## 2017-04-11 NOTE — Progress Notes (Signed)
[place] PHYSICAL MEDICINE & REHABILITATION     PROGRESS NOTE  Subjective/Complaints:  Patient seen lying in bed this morning. She states she slept well overnight. She denies complaints this morning.  ROS: Denies CP, SOB, N/V/D.  Objective: Vital Signs: Blood pressure (!) 123/56, pulse 65, temperature 97.9 F (36.6 C), temperature source Oral, resp. rate 18, height 4\' 11"  (1.499 m), weight 42.5 kg (93 lb 9.6 oz), SpO2 96 %. No results found. No results for input(s): WBC, HGB, HCT, PLT in the last 72 hours. No results for input(s): NA, K, CL, GLUCOSE, BUN, CREATININE, CALCIUM in the last 72 hours.  Invalid input(s): CO CBG (last 3)  No results for input(s): GLUCAP in the last 72 hours.  Wt Readings from Last 3 Encounters:  04/05/17 42.5 kg (93 lb 9.6 oz)  04/04/17 44.2 kg (97 lb 8 oz)  02/16/17 45.1 kg (99 lb 6.4 oz)    Physical Exam:  BP (!) 123/56 (BP Location: Right Arm)   Pulse 65   Temp 97.9 F (36.6 C) (Oral)   Resp 18   Ht 4\' 11"  (1.499 m)   Wt 42.5 kg (93 lb 9.6 oz)   SpO2 96%   BMI 18.90 kg/m  Constitutional: She appears well-developed. Frail.  HENT: Normocephalic and atraumatic.  Eyes: EOM are normal. No discharge.  Cardiovascular: RRR. No JVD.  Respiratory: Effort normal and breath sounds normal. GI: Bowel sounds are normal. She exhibits no distension.  Musculoskeletal: She exhibits no edema or tenderness.  Neurological: She is alert and oriented.  Makes good eye contact with examiner.  Follows commands.  Right facial weakness Motor: LUE/LLE: 4/5 proximal to distal RUE: 4/5 proximal to distal.  Increased tone  RLE: 4/5 proximal to distal (weaker than left, stable) Skin: Skin is warm and dry.  Psychiatric: She has a normal mood and affect. Her behavior is normal. Thought content normal  Assessment/Plan: 1. Functional deficits secondary to bilateral infarcts with history of CVA with spastic hemiplegia which require 3+ hours per day of interdisciplinary  therapy in a comprehensive inpatient rehab setting. Physiatrist is providing close team supervision and 24 hour management of active medical problems listed below. Physiatrist and rehab team continue to assess barriers to discharge/monitor patient progress toward functional and medical goals.  Function:  Bathing Bathing position   Position: Wheelchair/chair at sink  Bathing parts Body parts bathed by patient: Right arm, Left arm, Chest, Abdomen, Front perineal area, Right upper leg, Left upper leg Body parts bathed by helper: Buttocks, Right lower leg, Left lower leg, Back  Bathing assist Assist Level: (Mod assist)      Upper Body Dressing/Undressing Upper body dressing   What is the patient wearing?: Pull over shirt/dress     Pull over shirt/dress - Perfomed by patient: Thread/unthread right sleeve, Thread/unthread left sleeve, Put head through opening, Pull shirt over trunk Pull over shirt/dress - Perfomed by helper: Pull shirt over trunk        Upper body assist Assist Level: (Min assist)      Lower Body Dressing/Undressing Lower body dressing   What is the patient wearing?: Pants, Socks, Shoes     Pants- Performed by patient: Thread/unthread right pants leg, Thread/unthread left pants leg Pants- Performed by helper: Pull pants up/down       Socks - Performed by helper: Don/doff right sock, Don/doff left sock Shoes - Performed by patient: Don/doff right shoe, Don/doff left shoe, Fasten right, Fasten left  Lower body assist Assist for lower body dressing: (Mod assist)      Toileting Toileting   Toileting steps completed by patient: Performs perineal hygiene Toileting steps completed by helper: Adjust clothing prior to toileting, Adjust clothing after toileting Toileting Assistive Devices: Grab bar or rail  Toileting assist Assist level: Touching or steadying assistance (Pt.75%)   Transfers Chair/bed transfer   Chair/bed transfer method: Stand  pivot Chair/bed transfer assist level: Touching or steadying assistance (Pt > 75%) Chair/bed transfer assistive device: Armrests, Patent attorneyWalker     Locomotion Ambulation     Max distance: 4480' Assist level: Touching or steadying assistance (Pt > 75%)   Wheelchair   Type: Manual Max wheelchair distance: 150 Assist Level: Supervision or verbal cues  Cognition Comprehension Comprehension assist level: Follows complex conversation/direction with extra time/assistive device  Expression Expression assist level: Expresses complex ideas: With extra time/assistive device  Social Interaction Social Interaction assist level: Interacts appropriately with others - No medications needed.  Problem Solving Problem solving assist level: Solves basic 90% of the time/requires cueing < 10% of the time  Memory Memory assist level: Recognizes or recalls 90% of the time/requires cueing < 10% of the time    Medical Problem List and Plan: 1.  Increased right-sided weakness secondary to multiple bilateral infarcts as well as history of left MCA infarction September 2017 with right-sided residual weakness   Cont CIR 2.  DVT Prophylaxis/Anticoagulation: Subcutaneous Lovenox. Monitor platelet counts in any signs of bleeding 3. Pain Management: Tylenol as needed 4. Mood: Prozac 20 mg daily 5. Neuropsych: This patient is capable of making decisions on her own behalf. 6. Skin/Wound Care: Routine skin checks 7. Fluids/Electrolytes/Nutrition: Routine I&O's 8.Pre- diabetes. Hemoglobin A1c 5.6.SSI   Controlled on 11/14 9. Hypertension. Patient on no antihypertensive medications prior to admission.   Overall controlled on 11/18 10. Diastolic congestive heart failure. Monitor for any signs of fluid overload Filed Weights   04/05/17 1509  Weight: 42.5 kg (93 lb 9.6 oz)  11. Hyperlipidemia. Lipitor 12. Neurogenic bladder.    Ditropan 5 mg daily d/ced 11/15.    PVRs inconsistent, will cont to monitor   UA ?, Ucx  pending 13. Trigeminal neuralgia. Topamax 50 mg twice a day 14. Hypoalbuminemia   Supplement initiated 11/13 15. Anemia of chronic disease   Hb 11.0 on 11/13   Cont to monitor 16. Lumbar compression fracture   CXR reviewed with subtle abnormalities, Lumbar xrays ordered, reviewed, age indeterminate fracture, reviewed with patient   Conservative management  LOS (Days) 6 A FACE TO FACE EVALUATION WAS PERFORMED  Ankit Karis Jubanil Patel 04/11/2017 9:25 AM

## 2017-04-12 ENCOUNTER — Inpatient Hospital Stay (HOSPITAL_COMMUNITY): Payer: Medicare Other | Admitting: Occupational Therapy

## 2017-04-12 ENCOUNTER — Inpatient Hospital Stay (HOSPITAL_COMMUNITY): Payer: Medicare Other

## 2017-04-12 ENCOUNTER — Inpatient Hospital Stay (HOSPITAL_COMMUNITY): Payer: Medicare Other | Admitting: Physical Therapy

## 2017-04-12 LAB — CREATININE, SERUM
Creatinine, Ser: 0.59 mg/dL (ref 0.44–1.00)
GFR calc Af Amer: >60 mL/min (ref >60)
GFR calc non Af Amer: >60 mL/min (ref >60)

## 2017-04-12 NOTE — Progress Notes (Signed)
Occupational Therapy Discharge Summary  Patient Details  Name: Kelly [name] MRN: [id number] Date of Birth: 1927-10-29    Patient has met 9 of 9 long term goals due to improved activity tolerance, improved balance and postural control.  Patient to discharge at overall Supervision level.  Patient's care partner is independent to provide the necessary physical assistance at discharge.    Reasons goals not met: N/A  Recommendation:  Patient will benefit from ongoing skilled OT services in home health setting to continue to advance functional skills in the area of BADL and Reduce care partner burden.  Equipment: No equipment provided  Reasons for discharge: treatment goals met  Patient/family agrees with progress made and goals achieved: Yes  OT Discharge Precautions/Restrictions  Precautions Precautions: Fall Precaution Comments: R sided weakness Restrictions Weight Bearing Restrictions: No    Vital Signs Therapy Vitals Temp: 98.6 F (37 C) Temp Source: Oral Pulse Rate: 85 Resp: 18 BP: 117/76 Patient Position (if appropriate): Sitting Oxygen Therapy SpO2: 98 % O2 Device: Not Delivered Pain Pain Assessment Pain Assessment: No/denies pain ADL ADL ADL Comments: please see functional navigator     Cognition Overall Cognitive Status: Within Functional Limits for tasks assessed Arousal/Alertness: Awake/alert Sensation Sensation Light Touch: Appears Intact Proprioception: Appears Intact Coordination Gross Motor Movements are Fluid and Coordinated: No Fine Motor Movements are Fluid and Coordinated: No Coordination and Movement Description: bilat UE tremors, Rt UE weakness and increased tone Motor  Motor Motor: Hemiplegia;Abnormal tone;Abnormal postural alignment and control Motor - Discharge Observations: kyphosis, Rt hemiplegia Mobility  Bed Mobility Bed Mobility: Supine to Sit Supine to Sit: 5: Supervision Transfers Transfers: Sit to Stand;Stand to  Sit Sit to Stand: 5: Supervision Stand to Sit: 5: Supervision  Trunk/Postural Assessment  Cervical Assessment Cervical Assessment: (forward head ) Thoracic Assessment Thoracic Assessment: (kyphosis ) Lumbar Assessment Lumbar Assessment: (decreased lordosis ) Postural Control Righting Reactions: decreased, delayed  Balance Static Standing Balance Static Standing - Comment/# of Minutes: supervision level with single/bil UE support  Dynamic Standing Balance Dynamic Standing - Comments: supervision with RW  Extremity/Trunk Assessment RUE Assessment RUE Assessment: Exceptions to WFL(shoulder flexion approx 0-90*, loose gross grasp ) LUE Assessment LUE Assessment: Within Functional Limits   See Function Navigator for Current Functional Status.  Raymondo Band 04/12/2017, 4:16 PM

## 2017-04-12 NOTE — Discharge Summary (Signed)
Discharge summary job [id number]

## 2017-04-12 NOTE — Progress Notes (Signed)
[place] PHYSICAL MEDICINE & REHABILITATION     PROGRESS NOTE  Subjective/Complaints:  Pt seen sitting up in her chair this AM.  She slept well overnight, is ready for her long day of therapies, looking forward to going home tomorrow, appreciative of her care, and states she has to use the restroom.    ROS: Denies CP, SOB, N/V/D.  Objective: Vital Signs: Blood pressure 136/71, pulse 75, temperature 98.2 F (36.8 C), temperature source Oral, resp. rate 18, height 4\' 11"  (1.499 m), weight 42.5 kg (93 lb 9.6 oz), SpO2 97 %. No results found. No results for input(s): WBC, HGB, HCT, PLT in the last 72 hours. Recent Labs    04/12/17 0518  CREATININE 0.59   CBG (last 3)  No results for input(s): GLUCAP in the last 72 hours.  Wt Readings from Last 3 Encounters:  04/05/17 42.5 kg (93 lb 9.6 oz)  04/04/17 44.2 kg (97 lb 8 oz)  02/16/17 45.1 kg (99 lb 6.4 oz)    Physical Exam:  BP 136/71 (BP Location: Right Arm)   Pulse 75   Temp 98.2 F (36.8 C) (Oral)   Resp 18   Ht 4\' 11"  (1.499 m)   Wt 42.5 kg (93 lb 9.6 oz)   SpO2 97%   BMI 18.90 kg/m  Constitutional: She appears well-developed. Frail.  HENT: Normocephalic and atraumatic.  Eyes: EOM are normal. No discharge.  Cardiovascular: RRR. No JVD.  Respiratory: Effort normal and breath sounds normal. GI: Bowel sounds are normal. She exhibits no distension.  Musculoskeletal: She exhibits no edema or tenderness.  Neurological: She is alert and oriented.  Makes good eye contact with examiner.  Follows commands.  Right facial weakness Motor: LUE/LLE: 4/5 proximal to distal RUE: 4/5 proximal to distal.  Increased tone  RLE: 4/5 proximal to distal (weaker than left, unchanged) Skin: Skin is warm and dry.  Psychiatric: She has a normal mood and affect. Her behavior is normal. Thought content normal  Assessment/Plan: 1. Functional deficits secondary to bilateral infarcts with history of CVA with spastic hemiplegia which require 3+  hours per day of interdisciplinary therapy in a comprehensive inpatient rehab setting. Physiatrist is providing close team supervision and 24 hour management of active medical problems listed below. Physiatrist and rehab team continue to assess barriers to discharge/monitor patient progress toward functional and medical goals.  Function:  Bathing Bathing position   Position: Wheelchair/chair at sink  Bathing parts Body parts bathed by patient: Right arm, Left arm, Chest, Abdomen, Front perineal area, Right upper leg, Left upper leg Body parts bathed by helper: Buttocks, Right lower leg, Left lower leg, Back  Bathing assist Assist Level: (Mod assist)      Upper Body Dressing/Undressing Upper body dressing   What is the patient wearing?: Pull over shirt/dress     Pull over shirt/dress - Perfomed by patient: Thread/unthread right sleeve, Thread/unthread left sleeve, Put head through opening, Pull shirt over trunk Pull over shirt/dress - Perfomed by helper: Pull shirt over trunk        Upper body assist Assist Level: (Min assist)      Lower Body Dressing/Undressing Lower body dressing   What is the patient wearing?: Pants, Socks, Shoes     Pants- Performed by patient: Thread/unthread right pants leg, Thread/unthread left pants leg Pants- Performed by helper: Pull pants up/down       Socks - Performed by helper: Don/doff right sock, Don/doff left sock Shoes - Performed by patient: Don/doff right shoe, Don/doff  left shoe, Fasten right, Fasten left            Lower body assist Assist for lower body dressing: (Mod assist)      Toileting Toileting   Toileting steps completed by patient: Performs perineal hygiene Toileting steps completed by helper: Adjust clothing prior to toileting, Adjust clothing after toileting Toileting Assistive Devices: Grab bar or rail  Toileting assist Assist level: Touching or steadying assistance (Pt.75%)   Transfers Chair/bed transfer    Chair/bed transfer method: Stand pivot Chair/bed transfer assist level: Touching or steadying assistance (Pt > 75%) Chair/bed transfer assistive device: Armrests, Patent attorneyWalker     Locomotion Ambulation     Max distance: 3480' Assist level: Touching or steadying assistance (Pt > 75%)   Wheelchair   Type: Manual Max wheelchair distance: 150 Assist Level: Supervision or verbal cues  Cognition Comprehension Comprehension assist level: Follows complex conversation/direction with extra time/assistive device  Expression Expression assist level: Expresses complex ideas: With extra time/assistive device  Social Interaction Social Interaction assist level: Interacts appropriately with others - No medications needed.  Problem Solving Problem solving assist level: Solves basic 90% of the time/requires cueing < 10% of the time  Memory Memory assist level: Recognizes or recalls 90% of the time/requires cueing < 10% of the time    Medical Problem List and Plan: 1.  Increased right-sided weakness secondary to multiple bilateral infarcts as well as history of left MCA infarction September 2017 with right-sided residual weakness   Cont CIR, plan for d/c tomorrow 2.  DVT Prophylaxis/Anticoagulation: Subcutaneous Lovenox. Monitor platelet counts in any signs of bleeding 3. Pain Management: Tylenol as needed 4. Mood: Prozac 20 mg daily 5. Neuropsych: This patient is capable of making decisions on her own behalf. 6. Skin/Wound Care: Routine skin checks 7. Fluids/Electrolytes/Nutrition: Routine I&O's   Cr WNL on 11/19 8.Pre- diabetes. Hemoglobin A1c 5.6.SSI   Controlled on 11/14 9. Hypertension. Patient on no antihypertensive medications prior to admission.   Overall controlled on 11/19 10. Diastolic congestive heart failure. Monitor for any signs of fluid overload Filed Weights   04/05/17 1509  Weight: 42.5 kg (93 lb 9.6 oz)  11. Hyperlipidemia. Lipitor 12. Neurogenic bladder.    Ditropan 5 mg daily  d/ced 11/15.    PVRs inconsistent, will cont to monitor   UA ?, Ucx remains pending 13. Trigeminal neuralgia. Topamax 50 mg twice a day 14. Hypoalbuminemia   Supplement initiated 11/13 15. Anemia of chronic disease   Hb 11.0 on 11/13   Cont to monitor 16. Lumbar compression fracture   CXR reviewed with subtle abnormalities, Lumbar xrays ordered, reviewed, age indeterminate fracture, reviewed with patient   Conservative management  LOS (Days) 7 A FACE TO FACE EVALUATION WAS PERFORMED  Kelly [name] 04/12/2017 9:14 AM

## 2017-04-12 NOTE — Progress Notes (Signed)
Occupational Therapy Session Note  Patient Details  Name: Kelly HumphreysBennie H Hopping MRN: [id number] Date of Birth: Dec 13, 1927  Today's Date: 04/12/2017 OT Individual Time: [id number] OT Individual Time Calculation (min): 58 min    Short Term Goals: Week 1:  OT Short Term Goal 1 (Week 1): Pt will complete toilet transfer with min assist with LRAD OT Short Term Goal 2 (Week 1): Pt will complete LB dressing with supervision at sit > stand level OT Short Term Goal 3 (Week 1): Pt will complete bathing with min assist at sit > stand level OT Short Term Goal 4 (Week 1): Pt will complete tub/shower transfer with tub bench with min assist OT Short Term Goal 5 (Week 1): Pt will complete 2 grooming tasks in standing   Skilled Therapeutic Interventions/Progress Updates:    Pt presented supine in bed agreeable to OT treatment session. Pt completes bed mobility and stand pivot to w/c using RW with supervision. Completed UB/LB bathing and dressing ADLs from sit<>stand level w/c at sink with overall supervision. Pt requires increased time for UB/LB dressing, however demonstrates good carryover of hemi techniques and use of RUE as gross assist during task completion. Pt ambulates within room using RW [facility] for toileting task, supervision provided to ensure sufficient peri-care after BM with Pt completing in standing. Pt requires increased time for clothing management to advance pants over R hip, is able to complete with Min verbal cues. Pt completes standing grooming at sink to wash hands prior to return to siting in w/c. Handoff to PT end of session with Pt seated in w/c.   Therapy Documentation Precautions:  Precautions Precautions: Fall Precaution Comments: R sided weakness Restrictions Weight Bearing Restrictions: No    See Function Navigator for Current Functional Status.   Therapy/Group: Individual Therapy  Orlando PennerBreanna L Shelton Soler 04/12/2017, 4:06 PM

## 2017-04-12 NOTE — Progress Notes (Signed)
Physical Therapy Note  Patient Details  Name: Kelly [name] MRN: [id number] Date of Birth: 1928/01/06 Today's Date: 04/12/2017  [id number], 45 min individual tx Pain: none per pt  Pt very tired after previous session B and D. PT recommended that pt rest in bed after this session.  Simulated car transfer with RW, supervision.  Neuromuscular re-education via forced use, multimodal cues for A-P trunk orientaton in siting: 15 x 1 bil shoulder adduction, sustained seating x 5 min on wedge to decrease posterior pelvic tilt, and sustained stretch x 4 min  bil heel cords and hamstrings in standing on wedge during R hand activity.  Gait training with RW on level tile, with trials to assess efficacy for reducing R knee hyperextension with RSLS.  With Foot Up brace or with ACE wrap to decrease foot drop, R knee continues to hyperextend.  Pt's trunk flexion in standing and hip weakness appear to contribute more to hyperextension than her mild foot drop.   Pt left resting in w/c with all needs within reach. PT asked Asher MuirJamie, NT to put pt back to bed after lunch, before PM therapy.    See function navigator for current status.  Shaleka Brines 04/12/2017, 12:19 PM

## 2017-04-12 NOTE — Progress Notes (Signed)
Physical Therapy Discharge Summary  Patient Details  Name: Kelly [name] MRN: [id number] Date of Birth: August 06, 1927  Today's Date: 04/12/2017 PT Individual Time: 0830-0930 and 1400-1430 PT Individual Time Calculation (min): 60 min and 30 min  Session 1: no c/o pain.  Pt's husband present for session and provides supervision assist for gait.  Pt able to perform gait, transfers and bed mobility with supervision.  Stair negotiation with bilat handrails x 4 stairs with min A..  Standing balance/posture on foam with focus on A/P and lateral wt shifts with min A with RW.  Sit to stand blocked practice with supervision with focus on activity tolerance and wt shifts.  Pt able to perform seated and standing balance for dressing with supervision.  Session 2: no c/o pain.  Pt performed all transfers with supervision.  Gait 110', 30' with supervision with RW.  nustep x 7 minutes level 4 for UE/LE strength and endurance.  Pt and husband state they feel ready to d/c home tomorrow.   Patient has met 9 of 9 long term goals due to improved activity tolerance, improved balance, improved postural control, increased strength and ability to compensate for deficits.  Patient to discharge at an ambulatory level Supervision.   Patient's care partner is independent to provide the necessary supervision assistance at discharge.  Reasons goals not met: n/a  Recommendation:  Patient will benefit from ongoing skilled PT services in outpatient setting to continue to advance safe functional mobility, address ongoing impairments in balance, gait, posture, and minimize fall risk.  Equipment: No equipment provided  Reasons for discharge: treatment goals met and discharge from hospital  Patient/family agrees with progress made and goals achieved: Yes  PT Discharge Precautions/Restrictions Precautions Precautions: Fall Precaution Comments: R sided weakness Restrictions Weight Bearing Restrictions: No Pain Pain  Assessment Pain Assessment: No/denies pain  Cognition Overall Cognitive Status: Within Functional Limits for tasks assessed Arousal/Alertness: Awake/alert Sensation Sensation Light Touch: Appears Intact Proprioception: Appears Intact Coordination Gross Motor Movements are Fluid and Coordinated: No Fine Motor Movements are Fluid and Coordinated: No Coordination and Movement Description: bilat UE tremors, Rt UE weakness and increased tone Motor  Motor Motor: Hemiplegia;Abnormal tone;Abnormal postural alignment and control Motor - Discharge Observations: kyphosis, Rt hemiplegia   Trunk/Postural Assessment  Cervical Assessment Cervical Assessment: (fwd head) Thoracic Assessment Thoracic Assessment: (kyphosis) Lumbar Assessment Lumbar Assessment: (decreased lordosis) Postural Control Righting Reactions: decreased, delayed  Balance Dynamic Standing Balance Dynamic Standing - Comments: supervision with RW Extremity Assessment      RLE Assessment RLE Assessment: (grossly 3-/5) LLE Assessment LLE Assessment: (grossly 3/5)   See Function Navigator for Current Functional Status.  Nettye Flegal 04/12/2017, 11:31 AM

## 2017-04-12 NOTE — Discharge Summary (Signed)
Kelly [name]                ACCOUNT NO.:  [id number]  MEDICAL RECORD NO.:  [id number]  LOCATION:  [id number]                        FACILITY:  [facility]  PHYSICIAN:  Kelly Dollaraniel Jessika Rothery, P.A.  DATE OF BIRTH:  16-Mar-1928  DATE OF ADMISSION:  04/05/2017 DATE OF DISCHARGE:  04/13/2017                              DISCHARGE SUMMARY   DISCHARGE DIAGNOSES: 1. Multiple bilateral infarcts as well as history of left middle     cerebral artery infarction. 2. Subcutaneous Lovenox for deep vein thrombosis prophylaxis. 3. Depression. 4. Prediabetic. 5. Hypertension. 6. Diastolic congestive heart failure. 7. Hyperlipidemia. 8. Neurogenic bladder. 9. Trigeminal neuralgia. 10.Anemia of chronic disease. 11.Lumbar compression fracture 12. UTI.  Kelly [name] with history of hypertension, diastolic congestive heart failure, left MCA infarction with right-sided residual weakness, receiving inpatient rehab services in September 2017. Presented on April 03, 2017, with increasing right-sided weakness. Cranial CT scan negative for acute process.  The patient did not receive tPA.  MRI revealed subacute enhancing 4-mm left parietal infarction. Subacute small left parietal white matter infarction.  Small bifrontal and right parietal infarctions and MCA territory.  MRA with no emergent large vessel occlusion or stenosis.  Carotid Dopplers negative for ICA stenosis.  Neurology consulted, maintained on Plavix for CVA prophylaxis.  Subcutaneous Lovenox for DVT prophylaxis.  Tolerating a regular diet.  The patient was admitted for comprehensive rehab program.  PAST MEDICAL HISTORY:  See discharge diagnoses.  SOCIAL HISTORY:  Lives with spouse.  Husband assisted with basic ADLs. She used to rolling walker prior to admission.  FUNCTIONAL STATUS UPON ADMISSION TO REHAB SERVICES:  Minimal assist 25 feet rolling walker, minimal assist sit to stand, mod to max assist activities of  daily living.  PHYSICAL EXAMINATION:  VITAL SIGNS:  Blood pressure 140/63, pulse 64, temperature 98, and respirations 18. GENERAL:  Alert [name], in no acute distress.  Made good eye contact with examiner. HEENT:  EOMs intact. NECK:  Supple.  Nontender.  No JVD. CARDIAC:  Rate controlled. ABDOMEN:  Soft, nontender.  Good bowel sounds. LUNGS:  Clear to auscultation without wheeze. EXTREMITIES:  Right upper extremity 3+/5 proximal to distal with increased tone, right lower extremity 4-/5 proximal to distal, weaker than left.  REHABILITATION HOSPITAL COURSE:  The patient was admitted to Inpatient Rehab Services with therapies initiated on a 3-hour daily basis, consisting of physical therapy, occupational therapy, and rehabilitation nursing.  The following issues were addressed during the patient's rehabilitation stay.  Pertaining to [name]' multiple bilateral infarction, remained stable, maintained on Plavix for CVA prophylaxis, plan followup with Neurology Services.  Subcutaneous Lovenox for DVT prophylaxis, no bleeding episodes.  Noted history of depression, she remained on Prozac, emotional support provided, attending full therapies.  Prediabetic, hemoglobin A1c 5.6, blood sugars controlled and CBG checks later discontinued.  Blood pressures monitored, no orthostatic changes.  She remained on Lipitor for hyperlipidemia. Exhibited no signs of fluid overload.  Noted neurogenic bladder, initially on Ditropan, later discontinued, PVRs inconsistent, continued to monitor.  Trigeminal neuralgia, remained on Topamax.  No increasing headache noted.  Anemia of chronic disease, hemoglobin of 11.  Noted incidental finding on chest x-ray, lumbar compression fracture,  age indeterminate.  She had no increasing back pain, conservative management provided. Urine culture 04/10/2017 greater than 100,000 gram-positive cocci sensitivities pending placed on empiric Keflex. The patient received weekly  collaborative interdisciplinary team conferences to discuss estimated length of stay, family teaching, any barriers to discharge.  Ambulating with a rolling walker in a controlled and simulated environment up and down a ramp with a rolling walker, minimal assistance.  Functional transfers with rolling walker with facilitation of anterior weight shift requiring some setup and minimal assistance.  Activities of daily living and homemaking, needing some assist for lower body dressing.  Contact guard supervision with rest breaks.  Completes seated horseshoe, reach activities, bilateral upper extremities, minimal assistance.  Needing some verbal cues, monitoring of safety.  Full family teaching was completed and plan discharged to home.  DISCHARGE MEDICATIONS: 1. Lipitor 40 mg p.o. daily. 2. Plavix 75 mg p.o. daily. 3. Prozac 20 mg p.o. daily. 4. Topamax 50 mg p.o. b.i.d. 5. Tylenol as needed. 5. Ditropan 5 mg daily at bedtime 6. Keflex 500 mg every 12 hours 7 days  DIET:  Regular.  FOLLOWUP:  The patient will follow up with Kelly [name] at the Outpatient Rehab Service office as directed; Kelly [name], Neurology Service, call for appointment; Kelly [name], Medical Management.     Kelly Dollaraniel Yaileen Hofferber, P.A.     DA/MEDQ  D:  04/12/2017  T:  04/12/2017  Job:  [id number]  cc:   Pramod P. Pearlean BrownieSethi, MD Gaspar Garbeichard W. [name], M.D. Maryla MorrowAnkit Patel, MD

## 2017-04-13 DIAGNOSIS — N390 Urinary tract infection, site not specified: Secondary | ICD-10-CM

## 2017-04-13 MED ORDER — TOPIRAMATE 50 MG PO TABS: 50.0000 mg | ORAL_TABLET | Freq: Two times a day (BID) | ORAL | 1 refills | Status: DC

## 2017-04-13 MED ORDER — OXYBUTYNIN CHLORIDE 5 MG PO TABS: 5.0000 mg | ORAL_TABLET | Freq: Every day | ORAL | 0 refills | Status: DC

## 2017-04-13 MED ORDER — CEPHALEXIN 500 MG PO CAPS: 500.0000 mg | ORAL_CAPSULE | Freq: Two times a day (BID) | ORAL | 0 refills | Status: DC

## 2017-04-13 MED ORDER — FLUOXETINE HCL 20 MG PO TABS: 20.0000 mg | ORAL_TABLET | Freq: Every day | ORAL | 0 refills | Status: DC

## 2017-04-13 MED ORDER — CLOPIDOGREL BISULFATE 75 MG PO TABS: 75.0000 mg | ORAL_TABLET | Freq: Every day | ORAL | 0 refills | Status: DC

## 2017-04-13 MED ORDER — ATORVASTATIN CALCIUM 40 MG PO TABS: 40.0000 mg | ORAL_TABLET | Freq: Every day | ORAL | 0 refills | Status: AC

## 2017-04-13 MED ORDER — CEPHALEXIN 250 MG PO CAPS
500.0000 mg | ORAL_CAPSULE | Freq: Two times a day (BID) | ORAL | Status: DC
  Administered 2017-04-13: 500 mg via ORAL
  Filled 2017-04-13: qty 2

## 2017-04-13 NOTE — Discharge Instructions (Signed)
Inpatient Rehab Discharge Instructions  Kelly [name] Discharge date and time: No discharge date for patient encounter.   Activities/Precautions/ Functional Status: Activity: activity as tolerated Diet: regular diet Wound Care: none needed Functional status:  ___ No restrictions     ___ Walk up steps independently ___ 24/7 supervision/assistance   ___ Walk up steps with assistance ___ Intermittent supervision/assistance  ___ Bathe/dress independently ___ Walk with walker     _x STROKE/TIA DISCHARGE INSTRUCTIONS SMOKING Cigarette smoking nearly doubles your risk of having a stroke & is the single most alterable risk factor  If you smoke or have smoked in the last 12 months, you are advised to quit smoking for your health.  Most of the excess cardiovascular risk related to smoking disappears within a year of stopping.  Ask you doctor about anti-smoking medications  [place] Quit Line: 1-800-QUIT NOW  Free Smoking Cessation Classes [phone]  CHOLESTEROL Know your levels; limit fat & cholesterol in your diet  Lipid Panel     Component Value Date/Time   CHOL 99 04/04/2017 0408   TRIG 38 04/04/2017 0408   HDL 33 (L) 04/04/2017 0408   CHOLHDL 3.0 04/04/2017 0408   VLDL 8 04/04/2017 0408   LDLCALC 58 04/04/2017 0408      Many patients benefit from treatment even if their cholesterol is at goal.  Goal: Total Cholesterol (CHOL) less than 160  Goal:  Triglycerides (TRIG) less than 150  Goal:  HDL greater than 40  Goal:  LDL (LDLCALC) less than 100   BLOOD PRESSURE American Stroke Association blood pressure target is less that 120/80 mm/Hg  Your discharge blood pressure is:  BP: (!) 92/58  Monitor your blood pressure  Limit your salt and alcohol intake  Many individuals will require more than one medication for high blood pressure  DIABETES (A1c is a blood sugar average for last 3 months) Goal HGBA1c is under 7% (HBGA1c is blood sugar average for last 3 months)   Diabetes: No known diagnosis of diabetes    Lab Results  Component Value Date   HGBA1C 5.6 04/04/2017     Your HGBA1c can be lowered with medications, healthy diet, and exercise.  Check your blood sugar as directed by your physician  Call your physician if you experience unexplained or low blood sugars.  PHYSICAL ACTIVITY/REHABILITATION Goal is 30 minutes at least 4 days per week  Activity: Increase activity slowly, Therapies: Physical Therapy: Home Health Return to work:   Activity decreases your risk of heart attack and stroke and makes your heart stronger.  It helps control your weight and blood pressure; helps you relax and can improve your mood.  Participate in a regular exercise program.  Talk with your doctor about the best form of exercise for you (dancing, walking, swimming, cycling).  DIET/WEIGHT Goal is to maintain a healthy weight  Your discharge diet is: Diet regular Room service appropriate? Yes; Fluid consistency: Thin  liquids Your height is:  Height: 4\' 11"  (149.9 cm) Your current weight is: Weight: 42.5 kg (93 lb 9.6 oz) Your Body Mass Index (BMI) is:  BMI (Calculated): 18.89  Following the type of diet specifically designed for you will help prevent another stroke.  Your goal weight range is:    Your goal Body Mass Index (BMI) is 19-24.  Healthy food habits can help reduce 3 risk factors for stroke:  High cholesterol, hypertension, and excess weight.  RESOURCES Stroke/Support Group:  Call [phone]   STROKE EDUCATION PROVIDED/REVIEWED AND GIVEN  TO PATIENT Stroke warning signs and symptoms How to activate emergency medical system (call 911). Medications prescribed at discharge. Need for follow-up after discharge. Personal risk factors for stroke. Pneumonia vaccine given:  Flu vaccine given:  My questions have been answered, the writing is legible, and I understand these instructions.  I will adhere to these goals & educational materials that have been  provided to me after my discharge from the hospital.   __ Bathe/dress with assistance ___ Walk Independently    ___ Shower independently ___ Walk with assistance    ___ Shower with assistance ___ No alcohol     ___ Return to work/school ________   COMMUNITY REFERRALS UPON DISCHARGE:    Home Health:   PT     OT                      Agency:  [facility] Phone: [phone]           Special Instructions:    My questions have been answered and I understand these instructions. I will adhere to these goals and the provided educational materials after my discharge from the hospital.  Patient/Caregiver Signature _______________________________ Date __________  Clinician Signature _______________________________________ Date __________  Please bring this form and your medication list with you to all your follow-up doctor's appointments.

## 2017-04-13 NOTE — Progress Notes (Signed)
Social Work  Discharge Note  The overall goal for the admission was met for:   Discharge location: Yes - home with spouse who can provide 24/7 assistance.  Will hire private duty caregiver if he feels this is necessary (already has an agency ready to step in when needed.)  Length of Stay: Yes - 8 days  Discharge activity level: Yes - supervision -  min assist  Home/community participation: Yes  Services provided included: MD, RD, PT, OT, SLP, RN, TR, Pharmacy and SW  Financial Services: Medicare and Private Insurance: [place]  Follow-up services arranged: Home Health: PT, OT via [facility] and Patient/Family request agency HH: [facility], DME: NA - has all needed DME  Comments (or additional information):  Patient/Family verbalized understanding of follow-up arrangements: Yes  Individual responsible for coordination of the follow-up plan: pt/ spouse  Confirmed correct DME delivered: NA    Kylii Ennis

## 2017-04-13 NOTE — Progress Notes (Signed)
[place] PHYSICAL MEDICINE & REHABILITATION     PROGRESS NOTE  Subjective/Complaints:  Pt seen laying in bed this AM.  She is very excited about going home today.   ROS: Denies CP, SOB, N/V/D.  Objective: Vital Signs: Blood pressure 115/63, pulse 77, temperature 97.9 F (36.6 C), temperature source Oral, resp. rate 18, height 4\' 11"  (1.499 m), weight 42.5 kg (93 lb 9.6 oz), SpO2 99 %. No results found. No results for input(s): WBC, HGB, HCT, PLT in the last 72 hours. Recent Labs    04/12/17 0518  CREATININE 0.59   CBG (last 3)  No results for input(s): GLUCAP in the last 72 hours.  Wt Readings from Last 3 Encounters:  04/05/17 42.5 kg (93 lb 9.6 oz)  04/04/17 44.2 kg (97 lb 8 oz)  02/16/17 45.1 kg (99 lb 6.4 oz)    Physical Exam:  BP 115/63 (BP Location: Right Arm)   Pulse 77   Temp 97.9 F (36.6 C) (Oral)   Resp 18   Ht 4\' 11"  (1.499 m)   Wt 42.5 kg (93 lb 9.6 oz)   SpO2 99%   BMI 18.90 kg/m  Constitutional: She appears well-developed. Frail.  HENT: Normocephalic and atraumatic.  Eyes: EOM are normal. No discharge.  Cardiovascular: RRR. No JVD.  Respiratory: Effort normal and breath sounds normal. GI: Bowel sounds are normal. She exhibits no distension.  Musculoskeletal: She exhibits no edema or tenderness.  Neurological: She is alert and oriented.  Makes good eye contact with examiner.  Follows commands.  Right facial weakness Motor: LUE/LLE: 4/5 proximal to distal RUE: 4/5 proximal to distal.  Increased tone  RLE: 4/5 proximal to distal (weaker than left, stable) Skin: Skin is warm and dry.  Psychiatric: She has a normal mood and affect. Her behavior is normal. Thought content normal  Assessment/Plan: 1. Functional deficits secondary to bilateral infarcts with history of CVA with spastic hemiplegia which require 3+ hours per day of interdisciplinary therapy in a comprehensive inpatient rehab setting. Physiatrist is providing close team supervision and 24  hour management of active medical problems listed below. Physiatrist and rehab team continue to assess barriers to discharge/monitor patient progress toward functional and medical goals.  Function:  Bathing Bathing position   Position: Wheelchair/chair at sink  Bathing parts Body parts bathed by patient: Right arm, Left arm, Chest, Abdomen, Front perineal area, Right upper leg, Left upper leg, Buttocks, Right lower leg, Left lower leg Body parts bathed by helper: Back  Bathing assist Assist Level: Supervision or verbal cues      Upper Body Dressing/Undressing Upper body dressing   What is the patient wearing?: Pull over shirt/dress     Pull over shirt/dress - Perfomed by patient: Thread/unthread right sleeve, Thread/unthread left sleeve, Put head through opening, Pull shirt over trunk Pull over shirt/dress - Perfomed by helper: Pull shirt over trunk        Upper body assist Assist Level: Set up   Set up : To obtain clothing/put away  Lower Body Dressing/Undressing Lower body dressing   What is the patient wearing?: Pants, Socks, Shoes     Pants- Performed by patient: Thread/unthread right pants leg, Thread/unthread left pants leg, Pull pants up/down Pants- Performed by helper: Pull pants up/down     Socks - Performed by patient: Don/doff right sock, Don/doff left sock Socks - Performed by helper: Don/doff right sock, Don/doff left sock Shoes - Performed by patient: Don/doff right shoe, Don/doff left shoe  Lower body assist Assist for lower body dressing: Supervision or verbal cues      Toileting Toileting   Toileting steps completed by patient: Adjust clothing prior to toileting, Performs perineal hygiene Toileting steps completed by helper: Adjust clothing after toileting Toileting Assistive Devices: Grab bar or rail  Toileting assist Assist level: Supervision or verbal cues   Transfers Chair/bed transfer   Chair/bed transfer method: Stand  pivot Chair/bed transfer assist level: Supervision or verbal cues Chair/bed transfer assistive device: Armrests, Patent attorneyWalker     Locomotion Ambulation     Max distance: 75 Assist level: Supervision or verbal cues   Wheelchair   Type: Manual Max wheelchair distance: 150 Assist Level: Supervision or verbal cues  Cognition Comprehension Comprehension assist level: Follows complex conversation/direction with extra time/assistive device  Expression Expression assist level: Expresses complex ideas: With extra time/assistive device  Social Interaction Social Interaction assist level: Interacts appropriately with others - No medications needed.  Problem Solving Problem solving assist level: Solves basic 90% of the time/requires cueing < 10% of the time  Memory Memory assist level: Recognizes or recalls 90% of the time/requires cueing < 10% of the time    Medical Problem List and Plan: 1.  Increased right-sided weakness secondary to multiple bilateral infarcts as well as history of left MCA infarction September 2017 with right-sided residual weakness   D/c today   Will see patient for transitional care management in 1-2 weeks 2.  DVT Prophylaxis/Anticoagulation: Subcutaneous Lovenox. Monitor platelet counts in any signs of bleeding 3. Pain Management: Tylenol as needed 4. Mood: Prozac 20 mg daily 5. Neuropsych: This patient is capable of making decisions on her own behalf. 6. Skin/Wound Care: Routine skin checks 7. Fluids/Electrolytes/Nutrition: Routine I&O's   Cr WNL on 11/19 8.Pre- diabetes. Hemoglobin A1c 5.6.SSI   Controlled on 11/14 9. Hypertension. Patient on no antihypertensive medications prior to admission.   Controlled on 11/20 10. Diastolic congestive heart failure. Monitor for any signs of fluid overload Filed Weights   04/05/17 1509  Weight: 42.5 kg (93 lb 9.6 oz)  11. Hyperlipidemia. Lipitor 12. Neurogenic bladder with UTI.    Ditropan 5 mg daily d/ced 11/15.    PVRs  inconsistent, will cont to monitor   UA?, Ucx with >100K species   Empiric keflex ordered 13. Trigeminal neuralgia. Topamax 50 mg twice a day 14. Hypoalbuminemia   Supplement initiated 11/13 15. Anemia of chronic disease   Hb 11.0 on 11/13   Cont to monitor 16. Lumbar compression fracture   CXR reviewed with subtle abnormalities, Lumbar xrays ordered, reviewed, age indeterminate fracture, reviewed with patient   Conservative management  LOS (Days) 8 A FACE TO FACE EVALUATION WAS PERFORMED  Ankit Karis Jubanil Patel 04/13/2017 8:25 AM

## 2017-04-15 LAB — URINE CULTURE: Culture: >=100000 — AB

## 2017-04-16 DIAGNOSIS — R7303 Prediabetes: Secondary | ICD-10-CM | POA: Diagnosis not present

## 2017-04-16 DIAGNOSIS — M8008XD Age-related osteoporosis with current pathological fracture, vertebra(e), subsequent encounter for fracture with routine healing: Secondary | ICD-10-CM | POA: Diagnosis not present

## 2017-04-16 DIAGNOSIS — D638 Anemia in other chronic diseases classified elsewhere: Secondary | ICD-10-CM | POA: Diagnosis not present

## 2017-04-16 DIAGNOSIS — G250 Essential tremor: Secondary | ICD-10-CM | POA: Diagnosis not present

## 2017-04-16 DIAGNOSIS — G500 Trigeminal neuralgia: Secondary | ICD-10-CM | POA: Diagnosis not present

## 2017-04-16 DIAGNOSIS — N319 Neuromuscular dysfunction of bladder, unspecified: Secondary | ICD-10-CM | POA: Diagnosis not present

## 2017-04-16 DIAGNOSIS — I110 Hypertensive heart disease with heart failure: Secondary | ICD-10-CM | POA: Diagnosis not present

## 2017-04-16 DIAGNOSIS — N390 Urinary tract infection, site not specified: Secondary | ICD-10-CM | POA: Diagnosis not present

## 2017-04-16 DIAGNOSIS — I69351 Hemiplegia and hemiparesis following cerebral infarction affecting right dominant side: Secondary | ICD-10-CM | POA: Diagnosis not present

## 2017-04-16 DIAGNOSIS — I5030 Unspecified diastolic (congestive) heart failure: Secondary | ICD-10-CM | POA: Diagnosis not present

## 2017-04-16 DIAGNOSIS — F329 Major depressive disorder, single episode, unspecified: Secondary | ICD-10-CM | POA: Diagnosis not present

## 2017-04-19 DIAGNOSIS — N319 Neuromuscular dysfunction of bladder, unspecified: Secondary | ICD-10-CM | POA: Diagnosis not present

## 2017-04-19 DIAGNOSIS — I110 Hypertensive heart disease with heart failure: Secondary | ICD-10-CM | POA: Diagnosis not present

## 2017-04-19 DIAGNOSIS — I69351 Hemiplegia and hemiparesis following cerebral infarction affecting right dominant side: Secondary | ICD-10-CM | POA: Diagnosis not present

## 2017-04-19 DIAGNOSIS — N390 Urinary tract infection, site not specified: Secondary | ICD-10-CM | POA: Diagnosis not present

## 2017-04-19 DIAGNOSIS — I5030 Unspecified diastolic (congestive) heart failure: Secondary | ICD-10-CM | POA: Diagnosis not present

## 2017-04-19 DIAGNOSIS — M8008XD Age-related osteoporosis with current pathological fracture, vertebra(e), subsequent encounter for fracture with routine healing: Secondary | ICD-10-CM | POA: Diagnosis not present

## 2017-04-20 ENCOUNTER — Telehealth: Payer: Self-pay | Admitting: Neurology

## 2017-04-20 DIAGNOSIS — N319 Neuromuscular dysfunction of bladder, unspecified: Secondary | ICD-10-CM | POA: Diagnosis not present

## 2017-04-20 DIAGNOSIS — N390 Urinary tract infection, site not specified: Secondary | ICD-10-CM | POA: Diagnosis not present

## 2017-04-20 DIAGNOSIS — I69351 Hemiplegia and hemiparesis following cerebral infarction affecting right dominant side: Secondary | ICD-10-CM | POA: Diagnosis not present

## 2017-04-20 DIAGNOSIS — I110 Hypertensive heart disease with heart failure: Secondary | ICD-10-CM | POA: Diagnosis not present

## 2017-04-20 DIAGNOSIS — M8008XD Age-related osteoporosis with current pathological fracture, vertebra(e), subsequent encounter for fracture with routine healing: Secondary | ICD-10-CM | POA: Diagnosis not present

## 2017-04-20 DIAGNOSIS — I5030 Unspecified diastolic (congestive) heart failure: Secondary | ICD-10-CM | POA: Diagnosis not present

## 2017-04-20 NOTE — Telephone Encounter (Signed)
Rn call Darl PikesSusan with [facility] to get verbal orders. Rn stated per Dr. Pearlean BrownieSethi pt can have OT as given in the phone note. Darl PikesSusan verbalized understanding.

## 2017-04-20 NOTE — Telephone Encounter (Signed)
Beaulah CorinSusan Davis with [facility] is calling to get verbal orders for home health OT treatment 2 times a week for 4 weeks and 1 time a week for 2 weeks per evaluation results. If VM please leave a message.

## 2017-04-21 DIAGNOSIS — I110 Hypertensive heart disease with heart failure: Secondary | ICD-10-CM | POA: Diagnosis not present

## 2017-04-21 DIAGNOSIS — I69351 Hemiplegia and hemiparesis following cerebral infarction affecting right dominant side: Secondary | ICD-10-CM | POA: Diagnosis not present

## 2017-04-21 DIAGNOSIS — N319 Neuromuscular dysfunction of bladder, unspecified: Secondary | ICD-10-CM | POA: Diagnosis not present

## 2017-04-21 DIAGNOSIS — N390 Urinary tract infection, site not specified: Secondary | ICD-10-CM | POA: Diagnosis not present

## 2017-04-21 DIAGNOSIS — I5030 Unspecified diastolic (congestive) heart failure: Secondary | ICD-10-CM | POA: Diagnosis not present

## 2017-04-21 DIAGNOSIS — M8008XD Age-related osteoporosis with current pathological fracture, vertebra(e), subsequent encounter for fracture with routine healing: Secondary | ICD-10-CM | POA: Diagnosis not present

## 2017-04-23 DIAGNOSIS — I69351 Hemiplegia and hemiparesis following cerebral infarction affecting right dominant side: Secondary | ICD-10-CM | POA: Diagnosis not present

## 2017-04-23 DIAGNOSIS — I5030 Unspecified diastolic (congestive) heart failure: Secondary | ICD-10-CM | POA: Diagnosis not present

## 2017-04-23 DIAGNOSIS — N390 Urinary tract infection, site not specified: Secondary | ICD-10-CM | POA: Diagnosis not present

## 2017-04-23 DIAGNOSIS — I110 Hypertensive heart disease with heart failure: Secondary | ICD-10-CM | POA: Diagnosis not present

## 2017-04-23 DIAGNOSIS — N319 Neuromuscular dysfunction of bladder, unspecified: Secondary | ICD-10-CM | POA: Diagnosis not present

## 2017-04-23 DIAGNOSIS — M8008XD Age-related osteoporosis with current pathological fracture, vertebra(e), subsequent encounter for fracture with routine healing: Secondary | ICD-10-CM | POA: Diagnosis not present

## 2017-04-26 DIAGNOSIS — I110 Hypertensive heart disease with heart failure: Secondary | ICD-10-CM | POA: Diagnosis not present

## 2017-04-26 DIAGNOSIS — N319 Neuromuscular dysfunction of bladder, unspecified: Secondary | ICD-10-CM | POA: Diagnosis not present

## 2017-04-26 DIAGNOSIS — I69351 Hemiplegia and hemiparesis following cerebral infarction affecting right dominant side: Secondary | ICD-10-CM | POA: Diagnosis not present

## 2017-04-26 DIAGNOSIS — N390 Urinary tract infection, site not specified: Secondary | ICD-10-CM | POA: Diagnosis not present

## 2017-04-26 DIAGNOSIS — M8008XD Age-related osteoporosis with current pathological fracture, vertebra(e), subsequent encounter for fracture with routine healing: Secondary | ICD-10-CM | POA: Diagnosis not present

## 2017-04-26 DIAGNOSIS — I5030 Unspecified diastolic (congestive) heart failure: Secondary | ICD-10-CM | POA: Diagnosis not present

## 2017-04-27 DIAGNOSIS — I69351 Hemiplegia and hemiparesis following cerebral infarction affecting right dominant side: Secondary | ICD-10-CM | POA: Diagnosis not present

## 2017-04-27 DIAGNOSIS — I110 Hypertensive heart disease with heart failure: Secondary | ICD-10-CM | POA: Diagnosis not present

## 2017-04-27 DIAGNOSIS — I5030 Unspecified diastolic (congestive) heart failure: Secondary | ICD-10-CM | POA: Diagnosis not present

## 2017-04-27 DIAGNOSIS — N319 Neuromuscular dysfunction of bladder, unspecified: Secondary | ICD-10-CM | POA: Diagnosis not present

## 2017-04-27 DIAGNOSIS — N390 Urinary tract infection, site not specified: Secondary | ICD-10-CM | POA: Diagnosis not present

## 2017-04-27 DIAGNOSIS — M8008XD Age-related osteoporosis with current pathological fracture, vertebra(e), subsequent encounter for fracture with routine healing: Secondary | ICD-10-CM | POA: Diagnosis not present

## 2017-04-28 ENCOUNTER — Encounter: Payer: Medicare Other | Attending: Physical Medicine & Rehabilitation | Admitting: Physical Medicine & Rehabilitation

## 2017-04-28 ENCOUNTER — Encounter: Payer: Self-pay | Admitting: Physical Medicine & Rehabilitation

## 2017-04-28 VITALS — BP 105/57 | HR 82

## 2017-04-28 DIAGNOSIS — IMO0002 Reserved for concepts with insufficient information to code with codable children: Secondary | ICD-10-CM

## 2017-04-28 DIAGNOSIS — G8111 Spastic hemiplegia affecting right dominant side: Secondary | ICD-10-CM | POA: Diagnosis not present

## 2017-04-28 DIAGNOSIS — N3281 Overactive bladder: Secondary | ICD-10-CM

## 2017-04-28 DIAGNOSIS — I5030 Unspecified diastolic (congestive) heart failure: Secondary | ICD-10-CM | POA: Diagnosis not present

## 2017-04-28 DIAGNOSIS — M810 Age-related osteoporosis without current pathological fracture: Secondary | ICD-10-CM | POA: Diagnosis not present

## 2017-04-28 DIAGNOSIS — I639 Cerebral infarction, unspecified: Secondary | ICD-10-CM | POA: Diagnosis not present

## 2017-04-28 DIAGNOSIS — I110 Hypertensive heart disease with heart failure: Secondary | ICD-10-CM | POA: Insufficient documentation

## 2017-04-28 DIAGNOSIS — E785 Hyperlipidemia, unspecified: Secondary | ICD-10-CM | POA: Insufficient documentation

## 2017-04-28 DIAGNOSIS — I6389 Other cerebral infarction: Secondary | ICD-10-CM | POA: Diagnosis not present

## 2017-04-28 DIAGNOSIS — F4321 Adjustment disorder with depressed mood: Secondary | ICD-10-CM | POA: Diagnosis not present

## 2017-04-28 DIAGNOSIS — G500 Trigeminal neuralgia: Secondary | ICD-10-CM

## 2017-04-28 DIAGNOSIS — R269 Unspecified abnormalities of gait and mobility: Secondary | ICD-10-CM | POA: Diagnosis not present

## 2017-04-28 DIAGNOSIS — G250 Essential tremor: Secondary | ICD-10-CM | POA: Diagnosis not present

## 2017-04-28 DIAGNOSIS — I69351 Hemiplegia and hemiparesis following cerebral infarction affecting right dominant side: Secondary | ICD-10-CM | POA: Insufficient documentation

## 2017-04-28 DIAGNOSIS — L409 Psoriasis, unspecified: Secondary | ICD-10-CM | POA: Insufficient documentation

## 2017-04-28 DIAGNOSIS — G8191 Hemiplegia, unspecified affecting right dominant side: Secondary | ICD-10-CM | POA: Diagnosis not present

## 2017-04-28 DIAGNOSIS — R7303 Prediabetes: Secondary | ICD-10-CM | POA: Diagnosis not present

## 2017-04-28 DIAGNOSIS — S32040S Wedge compression fracture of fourth lumbar vertebra, sequela: Secondary | ICD-10-CM | POA: Diagnosis not present

## 2017-04-28 NOTE — Progress Notes (Signed)
Subjective:    Patient ID: Kelly [name], female    DOB: January 11, 1928, 83 y.o.   MRN: [id number]  HPI 83 year old right-handed female with history of essential tremor presents for follow up for bilateral infarcts.  Last clinic visit 03/17/17.  Husband present, who provides majority of history. At that time, she had botox injections.  Since that time, she was admitted to the hospital for follow up.  She as instructed to follow up with Neurology, with whom she has an appointment.  She has not seen PCP.  DATE OF ADMISSION:  04/05/2017 DATE OF DISCHARGE:  04/13/2017  Her BP is controlled. Her bladder symptoms have resolved.  Her back no longer hurts.  Husband has questions regarding medications.   Therapies: 2/week Mobility: Walker at [employer]/wheelchair at home.  DME: No new equipment  Pain Inventory Average Pain 0 Pain Right Now 0 My pain is  no pain  In the last 24 hours, has pain interfered with the following? General activity 0 Relation with others 0 Enjoyment of life 0 What TIME of day is your pain at its worst? no pain Sleep (in general) Good  Pain is worse with: no pain Pain improves with: no pain Relief from Meds: no meds  Mobility walk with assistance use a walker how many minutes can you walk? 10 ability to climb steps?  no do you drive?  no  Function retired I need assistance with the following:  meal prep, household duties and shopping  Neuro/Psych bladder control problems bowel control problems tremor trouble walking  Prior Studies Any changes since last visit?  no  Physicians involved in your care Any changes since last visit?  no   Family History  Problem Relation Age of Onset  . Arthritis Brother        spine  . Osteoporosis Maternal Grandmother    Social History   Socioeconomic History  . Marital status: Married    Spouse name: Kelly [name]  . Number of children: 3  . Years of education: college  . Highest education level: None  Social  Needs  . Financial resource strain: Not very hard  . Food insecurity - worry: Patient refused  . Food insecurity - inability: Patient refused  . Transportation needs - medical: Patient refused  . Transportation needs - non-medical: Patient refused  Occupational History  . Occupation: real estate    Comment: retired  Tobacco Use  . Smoking status: Never Smoker  . Smokeless tobacco: Never Used  Substance and Sexual Activity  . Alcohol use: Yes    Comment: Wine 2 glass daily  . Drug use: No  . Sexual activity: Not Currently    Partners: Male    Birth control/protection: Post-menopausal  Other Topics Concern  . None  Social History Narrative   Lives at home with her husband.   Right-handed.   No caffeine use.   Past Surgical History:  Procedure Laterality Date  . APPENDECTOMY  1940  . bladder tack    . CATARACT EXTRACTION Bilateral   . HEMORRHOID SURGERY  1960  . HUMERUS FRACTURE SURGERY    . LAPAROSCOPIC HYSTERECTOMY  2006  . torn rotator cuff    . WRIST FRACTURE SURGERY  2005   seconday shoulder 2001   Past Medical History:  Diagnosis Date  . Acute blood loss anemia   . Adjustment disorder with depressed mood   . CVA (cerebral vascular accident) (HCC) 02/03/2016   Linear infarct within the posterior limb of left internal capsule  .  Diastolic CHF (HCC)   . Dysphagia   . Dysphonia 02/20/2013  . Essential and other specified forms of tremor 02/20/2013  . HLD (hyperlipidemia)   . HTN (hypertension)   . OAB (overactive bladder)   . Osteoporosis   . Patient receiving subcutaneous heparin    For DVT prophylaxis 9/17  . Prediabetes   . Psoriasis   . Slow transit constipation   . Stroke (cerebrum) (HCC) 02/01/2016  . Trigeminal neuralgia 02/20/2013   BP (!) 105/57   Pulse 82   SpO2 96%   Opioid Risk Score:   Fall Risk Score:  `1  Depression screen PHQ 2/9  [facility] 12/10/2016 04/24/2016  Decreased Interest 0 0  Down, Depressed, Hopeless 0 0    PHQ - 2 Score 0 0  Altered sleeping - 0  Tired, decreased energy - 0  Change in appetite - 0  Feeling bad or failure about yourself  - 0  Trouble concentrating - 0  Moving slowly or fidgety/restless - 0  Suicidal thoughts - 0  PHQ-9 Score - 0    Review of Systems  Constitutional: Negative.   HENT: Negative.   Eyes: Negative.   Respiratory: Negative.   Cardiovascular: Negative.   Gastrointestinal: Negative.        Incontinence  Endocrine: Negative.   Musculoskeletal: Positive for arthralgias and gait problem.  Skin: Negative.   Allergic/Immunologic: Negative.   Neurological: Positive for tremors.  Hematological: Bruises/bleeds easily.  Psychiatric/Behavioral: Negative.   All other systems reviewed and are negative.     Objective:   Physical Exam Constitutional: She appears well-developed. Frail. NAD.  HENT: Normocephalic and atraumatic.  Eyes: EOMI. No discharge..  Cardiovascular: RRR.  No JVD. Respiratory: Effort normal and breath sounds normal.  GI: Soft. Bowel sounds are normal.  Musculoskeletal: She exhibits no edema or tenderness.  Gait: Slow cadence with kyphotic posture Neurological: She is alert and oriented.  HOH Mild dysarthria.  Right facial droop +LUE Resting tremor Motor: RUE: shoulder abduction, elbow flexion/extension 4/5, wrist 4/5, hand 4/5 RLE: 4+/5 hip flexion, knee extension, ankle dorsi/plantar flexion MAS: right elbow flexors 1/4, wrist flexors 1/4, finger flexors 1/4.  LUE/LLE: 5/5 proximal to distal  Psychiatric: She has a normal mood and affect. Her behavior is normal Skin: Skin is warm and dry    Assessment & Plan:  83 year old right-handed female with history of essential tremor presents for follow for bilateral infarcts.  1. Late effects bilateral infarct with spastic hemiplegia affecting right dominant side (G81.11).  Has had Botox injection, will cont:   Right Biceps 30U   Right FCR 20U, will increase to 50 based on patient  request   Right FDS 50U   Right FDP 20U  Cont therapies  2. Gait abnormality  Cont therapies  Cont walker for safety  3. Neurogenic bladder.   Significantly improved  4. Trigeminal neuralgia.    Cont Topamax 50 mg twice a day  5. Lumbar compression fracture  Improving  Cont conservative care

## 2017-04-30 DIAGNOSIS — I69351 Hemiplegia and hemiparesis following cerebral infarction affecting right dominant side: Secondary | ICD-10-CM | POA: Diagnosis not present

## 2017-04-30 DIAGNOSIS — N319 Neuromuscular dysfunction of bladder, unspecified: Secondary | ICD-10-CM | POA: Diagnosis not present

## 2017-04-30 DIAGNOSIS — I110 Hypertensive heart disease with heart failure: Secondary | ICD-10-CM | POA: Diagnosis not present

## 2017-04-30 DIAGNOSIS — N390 Urinary tract infection, site not specified: Secondary | ICD-10-CM | POA: Diagnosis not present

## 2017-04-30 DIAGNOSIS — I5030 Unspecified diastolic (congestive) heart failure: Secondary | ICD-10-CM | POA: Diagnosis not present

## 2017-04-30 DIAGNOSIS — M8008XD Age-related osteoporosis with current pathological fracture, vertebra(e), subsequent encounter for fracture with routine healing: Secondary | ICD-10-CM | POA: Diagnosis not present

## 2017-05-04 DIAGNOSIS — N319 Neuromuscular dysfunction of bladder, unspecified: Secondary | ICD-10-CM | POA: Diagnosis not present

## 2017-05-04 DIAGNOSIS — I110 Hypertensive heart disease with heart failure: Secondary | ICD-10-CM | POA: Diagnosis not present

## 2017-05-04 DIAGNOSIS — M8008XD Age-related osteoporosis with current pathological fracture, vertebra(e), subsequent encounter for fracture with routine healing: Secondary | ICD-10-CM | POA: Diagnosis not present

## 2017-05-04 DIAGNOSIS — I5030 Unspecified diastolic (congestive) heart failure: Secondary | ICD-10-CM | POA: Diagnosis not present

## 2017-05-04 DIAGNOSIS — I69351 Hemiplegia and hemiparesis following cerebral infarction affecting right dominant side: Secondary | ICD-10-CM | POA: Diagnosis not present

## 2017-05-04 DIAGNOSIS — N390 Urinary tract infection, site not specified: Secondary | ICD-10-CM | POA: Diagnosis not present

## 2017-05-05 DIAGNOSIS — I110 Hypertensive heart disease with heart failure: Secondary | ICD-10-CM | POA: Diagnosis not present

## 2017-05-05 DIAGNOSIS — M8008XD Age-related osteoporosis with current pathological fracture, vertebra(e), subsequent encounter for fracture with routine healing: Secondary | ICD-10-CM | POA: Diagnosis not present

## 2017-05-05 DIAGNOSIS — I5030 Unspecified diastolic (congestive) heart failure: Secondary | ICD-10-CM | POA: Diagnosis not present

## 2017-05-05 DIAGNOSIS — I69351 Hemiplegia and hemiparesis following cerebral infarction affecting right dominant side: Secondary | ICD-10-CM | POA: Diagnosis not present

## 2017-05-05 DIAGNOSIS — N390 Urinary tract infection, site not specified: Secondary | ICD-10-CM | POA: Diagnosis not present

## 2017-05-05 DIAGNOSIS — N319 Neuromuscular dysfunction of bladder, unspecified: Secondary | ICD-10-CM | POA: Diagnosis not present

## 2017-05-06 DIAGNOSIS — N390 Urinary tract infection, site not specified: Secondary | ICD-10-CM | POA: Diagnosis not present

## 2017-05-06 DIAGNOSIS — I110 Hypertensive heart disease with heart failure: Secondary | ICD-10-CM | POA: Diagnosis not present

## 2017-05-06 DIAGNOSIS — N319 Neuromuscular dysfunction of bladder, unspecified: Secondary | ICD-10-CM | POA: Diagnosis not present

## 2017-05-06 DIAGNOSIS — I5030 Unspecified diastolic (congestive) heart failure: Secondary | ICD-10-CM | POA: Diagnosis not present

## 2017-05-06 DIAGNOSIS — M8008XD Age-related osteoporosis with current pathological fracture, vertebra(e), subsequent encounter for fracture with routine healing: Secondary | ICD-10-CM | POA: Diagnosis not present

## 2017-05-06 DIAGNOSIS — I69351 Hemiplegia and hemiparesis following cerebral infarction affecting right dominant side: Secondary | ICD-10-CM | POA: Diagnosis not present

## 2017-05-07 DIAGNOSIS — I5030 Unspecified diastolic (congestive) heart failure: Secondary | ICD-10-CM | POA: Diagnosis not present

## 2017-05-07 DIAGNOSIS — M8008XD Age-related osteoporosis with current pathological fracture, vertebra(e), subsequent encounter for fracture with routine healing: Secondary | ICD-10-CM | POA: Diagnosis not present

## 2017-05-07 DIAGNOSIS — N390 Urinary tract infection, site not specified: Secondary | ICD-10-CM | POA: Diagnosis not present

## 2017-05-07 DIAGNOSIS — I110 Hypertensive heart disease with heart failure: Secondary | ICD-10-CM | POA: Diagnosis not present

## 2017-05-07 DIAGNOSIS — I69351 Hemiplegia and hemiparesis following cerebral infarction affecting right dominant side: Secondary | ICD-10-CM | POA: Diagnosis not present

## 2017-05-07 DIAGNOSIS — N319 Neuromuscular dysfunction of bladder, unspecified: Secondary | ICD-10-CM | POA: Diagnosis not present

## 2017-05-10 NOTE — Telephone Encounter (Addendum)
Kelly PikesSusan returned RN's call. She said the pt has a second stroke with 2nd stroke and rehab. And now home. They have finished the original orders and is requesting new orders. Please call

## 2017-05-10 NOTE — Telephone Encounter (Signed)
Darl PikesSusan with [facility] calling to get verbal orders for PT and OT 1 time a week for 2 weeks and 2 times a week for 2 weeks. After this patient should be ready for outpatient.

## 2017-05-10 NOTE — Telephone Encounter (Signed)
Left vm for Darl PikesSusan from [facility] to call back about orders.

## 2017-05-11 DIAGNOSIS — M8008XD Age-related osteoporosis with current pathological fracture, vertebra(e), subsequent encounter for fracture with routine healing: Secondary | ICD-10-CM | POA: Diagnosis not present

## 2017-05-11 DIAGNOSIS — I5030 Unspecified diastolic (congestive) heart failure: Secondary | ICD-10-CM | POA: Diagnosis not present

## 2017-05-11 DIAGNOSIS — I110 Hypertensive heart disease with heart failure: Secondary | ICD-10-CM | POA: Diagnosis not present

## 2017-05-11 DIAGNOSIS — N390 Urinary tract infection, site not specified: Secondary | ICD-10-CM | POA: Diagnosis not present

## 2017-05-11 DIAGNOSIS — N319 Neuromuscular dysfunction of bladder, unspecified: Secondary | ICD-10-CM | POA: Diagnosis not present

## 2017-05-11 DIAGNOSIS — I69351 Hemiplegia and hemiparesis following cerebral infarction affecting right dominant side: Secondary | ICD-10-CM | POA: Diagnosis not present

## 2017-05-11 NOTE — Telephone Encounter (Signed)
[facility] [phone]. Rn left vm that per Dr. Pearlean BrownieSethi the orders for PT, and OT that she listed for one time a week for 2 weeks,and 2 times a week for 2 weeks can be done. This is per Dr. Pearlean BrownieSethi.

## 2017-05-12 DIAGNOSIS — N390 Urinary tract infection, site not specified: Secondary | ICD-10-CM | POA: Diagnosis not present

## 2017-05-12 DIAGNOSIS — I69351 Hemiplegia and hemiparesis following cerebral infarction affecting right dominant side: Secondary | ICD-10-CM | POA: Diagnosis not present

## 2017-05-12 DIAGNOSIS — N319 Neuromuscular dysfunction of bladder, unspecified: Secondary | ICD-10-CM | POA: Diagnosis not present

## 2017-05-12 DIAGNOSIS — I5030 Unspecified diastolic (congestive) heart failure: Secondary | ICD-10-CM | POA: Diagnosis not present

## 2017-05-12 DIAGNOSIS — I110 Hypertensive heart disease with heart failure: Secondary | ICD-10-CM | POA: Diagnosis not present

## 2017-05-12 DIAGNOSIS — M8008XD Age-related osteoporosis with current pathological fracture, vertebra(e), subsequent encounter for fracture with routine healing: Secondary | ICD-10-CM | POA: Diagnosis not present

## 2017-05-13 DIAGNOSIS — N390 Urinary tract infection, site not specified: Secondary | ICD-10-CM | POA: Diagnosis not present

## 2017-05-13 DIAGNOSIS — I69351 Hemiplegia and hemiparesis following cerebral infarction affecting right dominant side: Secondary | ICD-10-CM | POA: Diagnosis not present

## 2017-05-13 DIAGNOSIS — N319 Neuromuscular dysfunction of bladder, unspecified: Secondary | ICD-10-CM | POA: Diagnosis not present

## 2017-05-13 DIAGNOSIS — I110 Hypertensive heart disease with heart failure: Secondary | ICD-10-CM | POA: Diagnosis not present

## 2017-05-13 DIAGNOSIS — I5030 Unspecified diastolic (congestive) heart failure: Secondary | ICD-10-CM | POA: Diagnosis not present

## 2017-05-13 DIAGNOSIS — M8008XD Age-related osteoporosis with current pathological fracture, vertebra(e), subsequent encounter for fracture with routine healing: Secondary | ICD-10-CM | POA: Diagnosis not present

## 2017-05-14 DIAGNOSIS — I69351 Hemiplegia and hemiparesis following cerebral infarction affecting right dominant side: Secondary | ICD-10-CM | POA: Diagnosis not present

## 2017-05-14 DIAGNOSIS — I5030 Unspecified diastolic (congestive) heart failure: Secondary | ICD-10-CM | POA: Diagnosis not present

## 2017-05-14 DIAGNOSIS — N319 Neuromuscular dysfunction of bladder, unspecified: Secondary | ICD-10-CM | POA: Diagnosis not present

## 2017-05-14 DIAGNOSIS — M8008XD Age-related osteoporosis with current pathological fracture, vertebra(e), subsequent encounter for fracture with routine healing: Secondary | ICD-10-CM | POA: Diagnosis not present

## 2017-05-14 DIAGNOSIS — N390 Urinary tract infection, site not specified: Secondary | ICD-10-CM | POA: Diagnosis not present

## 2017-05-14 DIAGNOSIS — I110 Hypertensive heart disease with heart failure: Secondary | ICD-10-CM | POA: Diagnosis not present

## 2017-05-17 DIAGNOSIS — N390 Urinary tract infection, site not specified: Secondary | ICD-10-CM | POA: Diagnosis not present

## 2017-05-17 DIAGNOSIS — I110 Hypertensive heart disease with heart failure: Secondary | ICD-10-CM | POA: Diagnosis not present

## 2017-05-17 DIAGNOSIS — I5030 Unspecified diastolic (congestive) heart failure: Secondary | ICD-10-CM | POA: Diagnosis not present

## 2017-05-17 DIAGNOSIS — M8008XD Age-related osteoporosis with current pathological fracture, vertebra(e), subsequent encounter for fracture with routine healing: Secondary | ICD-10-CM | POA: Diagnosis not present

## 2017-05-17 DIAGNOSIS — I69351 Hemiplegia and hemiparesis following cerebral infarction affecting right dominant side: Secondary | ICD-10-CM | POA: Diagnosis not present

## 2017-05-17 DIAGNOSIS — N319 Neuromuscular dysfunction of bladder, unspecified: Secondary | ICD-10-CM | POA: Diagnosis not present

## 2017-05-21 DIAGNOSIS — N319 Neuromuscular dysfunction of bladder, unspecified: Secondary | ICD-10-CM | POA: Diagnosis not present

## 2017-05-21 DIAGNOSIS — I110 Hypertensive heart disease with heart failure: Secondary | ICD-10-CM | POA: Diagnosis not present

## 2017-05-21 DIAGNOSIS — N390 Urinary tract infection, site not specified: Secondary | ICD-10-CM | POA: Diagnosis not present

## 2017-05-21 DIAGNOSIS — I69351 Hemiplegia and hemiparesis following cerebral infarction affecting right dominant side: Secondary | ICD-10-CM | POA: Diagnosis not present

## 2017-05-21 DIAGNOSIS — M8008XD Age-related osteoporosis with current pathological fracture, vertebra(e), subsequent encounter for fracture with routine healing: Secondary | ICD-10-CM | POA: Diagnosis not present

## 2017-05-21 DIAGNOSIS — I5030 Unspecified diastolic (congestive) heart failure: Secondary | ICD-10-CM | POA: Diagnosis not present

## 2017-05-26 DIAGNOSIS — N319 Neuromuscular dysfunction of bladder, unspecified: Secondary | ICD-10-CM | POA: Diagnosis not present

## 2017-05-26 DIAGNOSIS — N390 Urinary tract infection, site not specified: Secondary | ICD-10-CM | POA: Diagnosis not present

## 2017-05-26 DIAGNOSIS — I69351 Hemiplegia and hemiparesis following cerebral infarction affecting right dominant side: Secondary | ICD-10-CM | POA: Diagnosis not present

## 2017-05-26 DIAGNOSIS — I110 Hypertensive heart disease with heart failure: Secondary | ICD-10-CM | POA: Diagnosis not present

## 2017-05-26 DIAGNOSIS — I5030 Unspecified diastolic (congestive) heart failure: Secondary | ICD-10-CM | POA: Diagnosis not present

## 2017-05-26 DIAGNOSIS — M8008XD Age-related osteoporosis with current pathological fracture, vertebra(e), subsequent encounter for fracture with routine healing: Secondary | ICD-10-CM | POA: Diagnosis not present

## 2017-05-28 DIAGNOSIS — I5030 Unspecified diastolic (congestive) heart failure: Secondary | ICD-10-CM | POA: Diagnosis not present

## 2017-05-28 DIAGNOSIS — M8008XD Age-related osteoporosis with current pathological fracture, vertebra(e), subsequent encounter for fracture with routine healing: Secondary | ICD-10-CM | POA: Diagnosis not present

## 2017-05-28 DIAGNOSIS — N319 Neuromuscular dysfunction of bladder, unspecified: Secondary | ICD-10-CM | POA: Diagnosis not present

## 2017-05-28 DIAGNOSIS — I110 Hypertensive heart disease with heart failure: Secondary | ICD-10-CM | POA: Diagnosis not present

## 2017-05-28 DIAGNOSIS — I69351 Hemiplegia and hemiparesis following cerebral infarction affecting right dominant side: Secondary | ICD-10-CM | POA: Diagnosis not present

## 2017-05-28 DIAGNOSIS — N390 Urinary tract infection, site not specified: Secondary | ICD-10-CM | POA: Diagnosis not present

## 2017-05-29 DIAGNOSIS — N390 Urinary tract infection, site not specified: Secondary | ICD-10-CM | POA: Diagnosis not present

## 2017-05-29 DIAGNOSIS — M8008XD Age-related osteoporosis with current pathological fracture, vertebra(e), subsequent encounter for fracture with routine healing: Secondary | ICD-10-CM | POA: Diagnosis not present

## 2017-05-29 DIAGNOSIS — I110 Hypertensive heart disease with heart failure: Secondary | ICD-10-CM | POA: Diagnosis not present

## 2017-05-29 DIAGNOSIS — I69351 Hemiplegia and hemiparesis following cerebral infarction affecting right dominant side: Secondary | ICD-10-CM | POA: Diagnosis not present

## 2017-05-29 DIAGNOSIS — N319 Neuromuscular dysfunction of bladder, unspecified: Secondary | ICD-10-CM | POA: Diagnosis not present

## 2017-05-29 DIAGNOSIS — I5030 Unspecified diastolic (congestive) heart failure: Secondary | ICD-10-CM | POA: Diagnosis not present

## 2017-06-01 DIAGNOSIS — I110 Hypertensive heart disease with heart failure: Secondary | ICD-10-CM | POA: Diagnosis not present

## 2017-06-01 DIAGNOSIS — N390 Urinary tract infection, site not specified: Secondary | ICD-10-CM | POA: Diagnosis not present

## 2017-06-01 DIAGNOSIS — I69351 Hemiplegia and hemiparesis following cerebral infarction affecting right dominant side: Secondary | ICD-10-CM | POA: Diagnosis not present

## 2017-06-01 DIAGNOSIS — M8008XD Age-related osteoporosis with current pathological fracture, vertebra(e), subsequent encounter for fracture with routine healing: Secondary | ICD-10-CM | POA: Diagnosis not present

## 2017-06-01 DIAGNOSIS — N319 Neuromuscular dysfunction of bladder, unspecified: Secondary | ICD-10-CM | POA: Diagnosis not present

## 2017-06-01 DIAGNOSIS — I5030 Unspecified diastolic (congestive) heart failure: Secondary | ICD-10-CM | POA: Diagnosis not present

## 2017-06-02 DIAGNOSIS — I110 Hypertensive heart disease with heart failure: Secondary | ICD-10-CM | POA: Diagnosis not present

## 2017-06-02 DIAGNOSIS — M8008XD Age-related osteoporosis with current pathological fracture, vertebra(e), subsequent encounter for fracture with routine healing: Secondary | ICD-10-CM | POA: Diagnosis not present

## 2017-06-02 DIAGNOSIS — I5030 Unspecified diastolic (congestive) heart failure: Secondary | ICD-10-CM | POA: Diagnosis not present

## 2017-06-02 DIAGNOSIS — N319 Neuromuscular dysfunction of bladder, unspecified: Secondary | ICD-10-CM | POA: Diagnosis not present

## 2017-06-02 DIAGNOSIS — I69351 Hemiplegia and hemiparesis following cerebral infarction affecting right dominant side: Secondary | ICD-10-CM | POA: Diagnosis not present

## 2017-06-02 DIAGNOSIS — N390 Urinary tract infection, site not specified: Secondary | ICD-10-CM | POA: Diagnosis not present

## 2017-06-03 DIAGNOSIS — I110 Hypertensive heart disease with heart failure: Secondary | ICD-10-CM | POA: Diagnosis not present

## 2017-06-03 DIAGNOSIS — N390 Urinary tract infection, site not specified: Secondary | ICD-10-CM | POA: Diagnosis not present

## 2017-06-03 DIAGNOSIS — I69351 Hemiplegia and hemiparesis following cerebral infarction affecting right dominant side: Secondary | ICD-10-CM | POA: Diagnosis not present

## 2017-06-03 DIAGNOSIS — N319 Neuromuscular dysfunction of bladder, unspecified: Secondary | ICD-10-CM | POA: Diagnosis not present

## 2017-06-03 DIAGNOSIS — I5030 Unspecified diastolic (congestive) heart failure: Secondary | ICD-10-CM | POA: Diagnosis not present

## 2017-06-03 DIAGNOSIS — M8008XD Age-related osteoporosis with current pathological fracture, vertebra(e), subsequent encounter for fracture with routine healing: Secondary | ICD-10-CM | POA: Diagnosis not present

## 2017-06-04 DIAGNOSIS — I69351 Hemiplegia and hemiparesis following cerebral infarction affecting right dominant side: Secondary | ICD-10-CM | POA: Diagnosis not present

## 2017-06-04 DIAGNOSIS — M8008XD Age-related osteoporosis with current pathological fracture, vertebra(e), subsequent encounter for fracture with routine healing: Secondary | ICD-10-CM | POA: Diagnosis not present

## 2017-06-04 DIAGNOSIS — N390 Urinary tract infection, site not specified: Secondary | ICD-10-CM | POA: Diagnosis not present

## 2017-06-04 DIAGNOSIS — I110 Hypertensive heart disease with heart failure: Secondary | ICD-10-CM | POA: Diagnosis not present

## 2017-06-04 DIAGNOSIS — I5030 Unspecified diastolic (congestive) heart failure: Secondary | ICD-10-CM | POA: Diagnosis not present

## 2017-06-04 DIAGNOSIS — N319 Neuromuscular dysfunction of bladder, unspecified: Secondary | ICD-10-CM | POA: Diagnosis not present

## 2017-06-07 DIAGNOSIS — M8008XD Age-related osteoporosis with current pathological fracture, vertebra(e), subsequent encounter for fracture with routine healing: Secondary | ICD-10-CM | POA: Diagnosis not present

## 2017-06-07 DIAGNOSIS — I5030 Unspecified diastolic (congestive) heart failure: Secondary | ICD-10-CM | POA: Diagnosis not present

## 2017-06-07 DIAGNOSIS — N319 Neuromuscular dysfunction of bladder, unspecified: Secondary | ICD-10-CM | POA: Diagnosis not present

## 2017-06-07 DIAGNOSIS — I69351 Hemiplegia and hemiparesis following cerebral infarction affecting right dominant side: Secondary | ICD-10-CM | POA: Diagnosis not present

## 2017-06-07 DIAGNOSIS — I110 Hypertensive heart disease with heart failure: Secondary | ICD-10-CM | POA: Diagnosis not present

## 2017-06-07 DIAGNOSIS — N390 Urinary tract infection, site not specified: Secondary | ICD-10-CM | POA: Diagnosis not present

## 2017-06-08 DIAGNOSIS — I69351 Hemiplegia and hemiparesis following cerebral infarction affecting right dominant side: Secondary | ICD-10-CM | POA: Diagnosis not present

## 2017-06-08 DIAGNOSIS — N390 Urinary tract infection, site not specified: Secondary | ICD-10-CM | POA: Diagnosis not present

## 2017-06-08 DIAGNOSIS — I110 Hypertensive heart disease with heart failure: Secondary | ICD-10-CM | POA: Diagnosis not present

## 2017-06-08 DIAGNOSIS — N319 Neuromuscular dysfunction of bladder, unspecified: Secondary | ICD-10-CM | POA: Diagnosis not present

## 2017-06-08 DIAGNOSIS — I5030 Unspecified diastolic (congestive) heart failure: Secondary | ICD-10-CM | POA: Diagnosis not present

## 2017-06-08 DIAGNOSIS — M8008XD Age-related osteoporosis with current pathological fracture, vertebra(e), subsequent encounter for fracture with routine healing: Secondary | ICD-10-CM | POA: Diagnosis not present

## 2017-06-10 ENCOUNTER — Telehealth: Payer: Self-pay | Admitting: Neurology

## 2017-06-10 DIAGNOSIS — I69351 Hemiplegia and hemiparesis following cerebral infarction affecting right dominant side: Secondary | ICD-10-CM | POA: Diagnosis not present

## 2017-06-10 DIAGNOSIS — I5030 Unspecified diastolic (congestive) heart failure: Secondary | ICD-10-CM | POA: Diagnosis not present

## 2017-06-10 DIAGNOSIS — N319 Neuromuscular dysfunction of bladder, unspecified: Secondary | ICD-10-CM | POA: Diagnosis not present

## 2017-06-10 DIAGNOSIS — M8008XD Age-related osteoporosis with current pathological fracture, vertebra(e), subsequent encounter for fracture with routine healing: Secondary | ICD-10-CM | POA: Diagnosis not present

## 2017-06-10 DIAGNOSIS — I110 Hypertensive heart disease with heart failure: Secondary | ICD-10-CM | POA: Diagnosis not present

## 2017-06-10 DIAGNOSIS — N390 Urinary tract infection, site not specified: Secondary | ICD-10-CM | POA: Diagnosis not present

## 2017-06-10 NOTE — Telephone Encounter (Signed)
Revised. 

## 2017-06-10 NOTE — Telephone Encounter (Signed)
[facility] [phone] called request PT 2 x 4. Continuing to make progress, slow and steady. Please call to advise

## 2017-06-10 NOTE — Telephone Encounter (Addendum)
Left vm for Kelly [name] with [facility] to request therapy orders. Rn stated per Dr. Pearlean BrownieSethi therapy for 2 times weekly for 4 weeks is okay. Rn recommend paperwork be sign once done. Left number of [phone] if she had any questions.

## 2017-06-14 DIAGNOSIS — I110 Hypertensive heart disease with heart failure: Secondary | ICD-10-CM | POA: Diagnosis not present

## 2017-06-14 DIAGNOSIS — N319 Neuromuscular dysfunction of bladder, unspecified: Secondary | ICD-10-CM | POA: Diagnosis not present

## 2017-06-14 DIAGNOSIS — N390 Urinary tract infection, site not specified: Secondary | ICD-10-CM | POA: Diagnosis not present

## 2017-06-14 DIAGNOSIS — M8008XD Age-related osteoporosis with current pathological fracture, vertebra(e), subsequent encounter for fracture with routine healing: Secondary | ICD-10-CM | POA: Diagnosis not present

## 2017-06-14 DIAGNOSIS — I69351 Hemiplegia and hemiparesis following cerebral infarction affecting right dominant side: Secondary | ICD-10-CM | POA: Diagnosis not present

## 2017-06-14 DIAGNOSIS — I5030 Unspecified diastolic (congestive) heart failure: Secondary | ICD-10-CM | POA: Diagnosis not present

## 2017-06-15 DIAGNOSIS — F329 Major depressive disorder, single episode, unspecified: Secondary | ICD-10-CM | POA: Diagnosis not present

## 2017-06-15 DIAGNOSIS — G250 Essential tremor: Secondary | ICD-10-CM | POA: Diagnosis not present

## 2017-06-15 DIAGNOSIS — M8008XD Age-related osteoporosis with current pathological fracture, vertebra(e), subsequent encounter for fracture with routine healing: Secondary | ICD-10-CM | POA: Diagnosis not present

## 2017-06-15 DIAGNOSIS — N319 Neuromuscular dysfunction of bladder, unspecified: Secondary | ICD-10-CM | POA: Diagnosis not present

## 2017-06-15 DIAGNOSIS — R7303 Prediabetes: Secondary | ICD-10-CM | POA: Diagnosis not present

## 2017-06-15 DIAGNOSIS — Z87440 Personal history of urinary (tract) infections: Secondary | ICD-10-CM | POA: Diagnosis not present

## 2017-06-15 DIAGNOSIS — I69351 Hemiplegia and hemiparesis following cerebral infarction affecting right dominant side: Secondary | ICD-10-CM | POA: Diagnosis not present

## 2017-06-15 DIAGNOSIS — G500 Trigeminal neuralgia: Secondary | ICD-10-CM | POA: Diagnosis not present

## 2017-06-15 DIAGNOSIS — D638 Anemia in other chronic diseases classified elsewhere: Secondary | ICD-10-CM | POA: Diagnosis not present

## 2017-06-15 DIAGNOSIS — I5030 Unspecified diastolic (congestive) heart failure: Secondary | ICD-10-CM | POA: Diagnosis not present

## 2017-06-15 DIAGNOSIS — I110 Hypertensive heart disease with heart failure: Secondary | ICD-10-CM | POA: Diagnosis not present

## 2017-06-16 DIAGNOSIS — N319 Neuromuscular dysfunction of bladder, unspecified: Secondary | ICD-10-CM | POA: Diagnosis not present

## 2017-06-16 DIAGNOSIS — M8008XD Age-related osteoporosis with current pathological fracture, vertebra(e), subsequent encounter for fracture with routine healing: Secondary | ICD-10-CM | POA: Diagnosis not present

## 2017-06-16 DIAGNOSIS — I5030 Unspecified diastolic (congestive) heart failure: Secondary | ICD-10-CM | POA: Diagnosis not present

## 2017-06-16 DIAGNOSIS — I110 Hypertensive heart disease with heart failure: Secondary | ICD-10-CM | POA: Diagnosis not present

## 2017-06-16 DIAGNOSIS — I69351 Hemiplegia and hemiparesis following cerebral infarction affecting right dominant side: Secondary | ICD-10-CM | POA: Diagnosis not present

## 2017-06-16 DIAGNOSIS — G500 Trigeminal neuralgia: Secondary | ICD-10-CM | POA: Diagnosis not present

## 2017-06-17 ENCOUNTER — Encounter: Payer: Medicare Other | Admitting: Physical Medicine & Rehabilitation

## 2017-06-21 ENCOUNTER — Telehealth: Payer: Self-pay | Admitting: Neurology

## 2017-06-21 DIAGNOSIS — I69351 Hemiplegia and hemiparesis following cerebral infarction affecting right dominant side: Secondary | ICD-10-CM | POA: Diagnosis not present

## 2017-06-21 DIAGNOSIS — N319 Neuromuscular dysfunction of bladder, unspecified: Secondary | ICD-10-CM | POA: Diagnosis not present

## 2017-06-21 DIAGNOSIS — I5030 Unspecified diastolic (congestive) heart failure: Secondary | ICD-10-CM | POA: Diagnosis not present

## 2017-06-21 DIAGNOSIS — G500 Trigeminal neuralgia: Secondary | ICD-10-CM | POA: Diagnosis not present

## 2017-06-21 DIAGNOSIS — I110 Hypertensive heart disease with heart failure: Secondary | ICD-10-CM | POA: Diagnosis not present

## 2017-06-21 DIAGNOSIS — M8008XD Age-related osteoporosis with current pathological fracture, vertebra(e), subsequent encounter for fracture with routine healing: Secondary | ICD-10-CM | POA: Diagnosis not present

## 2017-06-21 DIAGNOSIS — H04122 Dry eye syndrome of left lacrimal gland: Secondary | ICD-10-CM | POA: Diagnosis not present

## 2017-06-21 NOTE — Telephone Encounter (Signed)
Kelly FisherSusan Davis/[facility] [phone] called request VO for OT 1 x 1 for 6 wks. Please call

## 2017-06-21 NOTE — Telephone Encounter (Signed)
LEft vm on Beaulah CorinSusan Davis confidential [facility] [phone]. Per Dr. Pearlean BrownieSethi orders for OT for 6 weeks can be done.

## 2017-06-22 ENCOUNTER — Ambulatory Visit: Payer: Medicare Other | Admitting: Neurology

## 2017-06-24 DIAGNOSIS — N319 Neuromuscular dysfunction of bladder, unspecified: Secondary | ICD-10-CM | POA: Diagnosis not present

## 2017-06-24 DIAGNOSIS — G500 Trigeminal neuralgia: Secondary | ICD-10-CM | POA: Diagnosis not present

## 2017-06-24 DIAGNOSIS — M8008XD Age-related osteoporosis with current pathological fracture, vertebra(e), subsequent encounter for fracture with routine healing: Secondary | ICD-10-CM | POA: Diagnosis not present

## 2017-06-24 DIAGNOSIS — I5030 Unspecified diastolic (congestive) heart failure: Secondary | ICD-10-CM | POA: Diagnosis not present

## 2017-06-24 DIAGNOSIS — I110 Hypertensive heart disease with heart failure: Secondary | ICD-10-CM | POA: Diagnosis not present

## 2017-06-24 DIAGNOSIS — I69351 Hemiplegia and hemiparesis following cerebral infarction affecting right dominant side: Secondary | ICD-10-CM | POA: Diagnosis not present

## 2017-06-25 DIAGNOSIS — N319 Neuromuscular dysfunction of bladder, unspecified: Secondary | ICD-10-CM | POA: Diagnosis not present

## 2017-06-25 DIAGNOSIS — I69351 Hemiplegia and hemiparesis following cerebral infarction affecting right dominant side: Secondary | ICD-10-CM | POA: Diagnosis not present

## 2017-06-25 DIAGNOSIS — G500 Trigeminal neuralgia: Secondary | ICD-10-CM | POA: Diagnosis not present

## 2017-06-25 DIAGNOSIS — I5030 Unspecified diastolic (congestive) heart failure: Secondary | ICD-10-CM | POA: Diagnosis not present

## 2017-06-25 DIAGNOSIS — M8008XD Age-related osteoporosis with current pathological fracture, vertebra(e), subsequent encounter for fracture with routine healing: Secondary | ICD-10-CM | POA: Diagnosis not present

## 2017-06-25 DIAGNOSIS — I110 Hypertensive heart disease with heart failure: Secondary | ICD-10-CM | POA: Diagnosis not present

## 2017-06-30 DIAGNOSIS — M8008XD Age-related osteoporosis with current pathological fracture, vertebra(e), subsequent encounter for fracture with routine healing: Secondary | ICD-10-CM | POA: Diagnosis not present

## 2017-06-30 DIAGNOSIS — I69351 Hemiplegia and hemiparesis following cerebral infarction affecting right dominant side: Secondary | ICD-10-CM | POA: Diagnosis not present

## 2017-06-30 DIAGNOSIS — G500 Trigeminal neuralgia: Secondary | ICD-10-CM | POA: Diagnosis not present

## 2017-06-30 DIAGNOSIS — I110 Hypertensive heart disease with heart failure: Secondary | ICD-10-CM | POA: Diagnosis not present

## 2017-06-30 DIAGNOSIS — N319 Neuromuscular dysfunction of bladder, unspecified: Secondary | ICD-10-CM | POA: Diagnosis not present

## 2017-06-30 DIAGNOSIS — I5030 Unspecified diastolic (congestive) heart failure: Secondary | ICD-10-CM | POA: Diagnosis not present

## 2017-07-02 DIAGNOSIS — I69351 Hemiplegia and hemiparesis following cerebral infarction affecting right dominant side: Secondary | ICD-10-CM | POA: Diagnosis not present

## 2017-07-02 DIAGNOSIS — M8008XD Age-related osteoporosis with current pathological fracture, vertebra(e), subsequent encounter for fracture with routine healing: Secondary | ICD-10-CM | POA: Diagnosis not present

## 2017-07-02 DIAGNOSIS — I110 Hypertensive heart disease with heart failure: Secondary | ICD-10-CM | POA: Diagnosis not present

## 2017-07-02 DIAGNOSIS — G500 Trigeminal neuralgia: Secondary | ICD-10-CM | POA: Diagnosis not present

## 2017-07-02 DIAGNOSIS — I5030 Unspecified diastolic (congestive) heart failure: Secondary | ICD-10-CM | POA: Diagnosis not present

## 2017-07-02 DIAGNOSIS — N319 Neuromuscular dysfunction of bladder, unspecified: Secondary | ICD-10-CM | POA: Diagnosis not present

## 2017-07-03 DIAGNOSIS — I5030 Unspecified diastolic (congestive) heart failure: Secondary | ICD-10-CM | POA: Diagnosis not present

## 2017-07-03 DIAGNOSIS — I110 Hypertensive heart disease with heart failure: Secondary | ICD-10-CM | POA: Diagnosis not present

## 2017-07-03 DIAGNOSIS — M8008XD Age-related osteoporosis with current pathological fracture, vertebra(e), subsequent encounter for fracture with routine healing: Secondary | ICD-10-CM | POA: Diagnosis not present

## 2017-07-03 DIAGNOSIS — G500 Trigeminal neuralgia: Secondary | ICD-10-CM | POA: Diagnosis not present

## 2017-07-03 DIAGNOSIS — I69351 Hemiplegia and hemiparesis following cerebral infarction affecting right dominant side: Secondary | ICD-10-CM | POA: Diagnosis not present

## 2017-07-03 DIAGNOSIS — N319 Neuromuscular dysfunction of bladder, unspecified: Secondary | ICD-10-CM | POA: Diagnosis not present

## 2017-07-05 ENCOUNTER — Encounter: Payer: Self-pay | Admitting: Neurology

## 2017-07-05 ENCOUNTER — Ambulatory Visit (INDEPENDENT_AMBULATORY_CARE_PROVIDER_SITE_OTHER): Payer: Medicare Other | Admitting: Neurology

## 2017-07-05 VITALS — BP 110/78 | HR 72

## 2017-07-05 DIAGNOSIS — I639 Cerebral infarction, unspecified: Secondary | ICD-10-CM

## 2017-07-05 NOTE — Patient Instructions (Signed)
I had a long d/w patient and her husband about her recent cryptogenic  strokes, risk for recurrent stroke/TIAs, personally independently reviewed imaging studies and stroke evaluation results and answered questions.Continue Plavix  for secondary stroke prevention and maintain strict control of hypertension with blood pressure goal below 130/90, diabetes with hemoglobin A1c goal below 6.5% and lipids with LDL cholesterol goal below 70 mg/dL. I also advised the patient to eat a healthy diet with plenty of whole grains, cereals, fruits and vegetables, exercise regularly and maintain ideal body weight .we also discussed fall and safety precautions and I recommend she use a walker at all times and avoid falls and injuries. I also recommend outpatient physical and occupational therapy after her home therapy finishes. Followup in the future with my nurse practitioner Shanda Bumps in 3 months or call earlier if necessary   Fall Prevention in the Home Falls can cause injuries. They can happen to people of all ages. There are many things you can do to make your home safe and to help prevent falls. What can I do on the outside of my home?  Regularly fix the edges of walkways and driveways and fix any cracks.  Remove anything that might make you trip as you walk through a door, such as a raised step or threshold.  Trim any bushes or trees on the path to your home.  Use bright outdoor lighting.  Clear any walking paths of anything that might make someone trip, such as rocks or tools.  Regularly check to see if handrails are loose or broken. Make sure that both sides of any steps have handrails.  Any raised decks and porches should have guardrails on the edges.  Have any leaves, snow, or ice cleared regularly.  Use sand or salt on walking paths during winter.  Clean up any spills in your garage right away. This includes oil or grease spills. What can I do in the bathroom?  Use night lights.  Install grab  bars by the toilet and in the tub and shower. Do not use towel bars as grab bars.  Use non-skid mats or decals in the tub or shower.  If you need to sit down in the shower, use a plastic, non-slip stool.  Keep the floor dry. Clean up any water that spills on the floor as soon as it happens.  Remove soap buildup in the tub or shower regularly.  Attach bath mats securely with double-sided non-slip rug tape.  Do not have throw rugs and other things on the floor that can make you trip. What can I do in the bedroom?  Use night lights.  Make sure that you have a light by your bed that is easy to reach.  Do not use any sheets or blankets that are too big for your bed. They should not hang down onto the floor.  Have a firm chair that has side arms. You can use this for support while you get dressed.  Do not have throw rugs and other things on the floor that can make you trip. What can I do in the kitchen?  Clean up any spills right away.  Avoid walking on wet floors.  Keep items that you use a lot in easy-to-reach places.  If you need to reach something above you, use a strong step stool that has a grab bar.  Keep electrical cords out of the way.  Do not use floor polish or wax that makes floors slippery. If you must use  wax, use non-skid floor wax.  Do not have throw rugs and other things on the floor that can make you trip. What can I do with my stairs?  Do not leave any items on the stairs.  Make sure that there are handrails on both sides of the stairs and use them. Fix handrails that are broken or loose. Make sure that handrails are as long as the stairways.  Check any carpeting to make sure that it is firmly attached to the stairs. Fix any carpet that is loose or worn.  Avoid having throw rugs at the top or bottom of the stairs. If you do have throw rugs, attach them to the floor with carpet tape.  Make sure that you have a light switch at the top of the stairs and the  bottom of the stairs. If you do not have them, ask someone to add them for you. What else can I do to help prevent falls?  Wear shoes that: ? Do not have high heels. ? Have rubber bottoms. ? Are comfortable and fit you well. ? Are closed at the toe. Do not wear sandals.  If you use a stepladder: ? Make sure that it is fully opened. Do not climb a closed stepladder. ? Make sure that both sides of the stepladder are locked into place. ? Ask someone to hold it for you, if possible.  Clearly mark and make sure that you can see: ? Any grab bars or handrails. ? First and last steps. ? Where the edge of each step is.  Use tools that help you move around (mobility aids) if they are needed. These include: ? Canes. ? Walkers. ? Scooters. ? Crutches.  Turn on the lights when you go into a dark area. Replace any light bulbs as soon as they burn out.  Set up your furniture so you have a clear path. Avoid moving your furniture around.  If any of your floors are uneven, fix them.  If there are any pets around you, be aware of where they are.  Review your medicines with your doctor. Some medicines can make you feel dizzy. This can increase your chance of falling. Ask your doctor what other things that you can do to help prevent falls. This information is not intended to replace advice given to you by your health care provider. Make sure you discuss any questions you have with your health care provider. Document Released: 03/07/2009 Document Revised: 10/17/2015 Document Reviewed: 06/15/2014 Elsevier Interactive Patient Education  [employer]2018 Else[employer] Inc.

## 2017-07-05 NOTE — Progress Notes (Signed)
[facility] [address] [place]. [place] [id number] [phone]       OFFICE FOLLOW-UP VISIT NOTE  Ms. Kelly HumphreysBennie H [name] Date of Birth:  06/11/1927 Medical Record Number:  [id number]   Referring MD: Kelly Ledereraniel Anguilli, PA-C Reason for Referral: stroke f/u HPI: [name] is a pleasant 84 year old Caucasian lady who is accompanied today by her husband and seen for first office follow-up visit following hospital admission for stroke in November 2018. History is obtained from them as well as review of electronic medical records. I personally reviewed imaging films.Marlaina H Rallsis an 84 y.o.femalewho presented via EMS as a Code Stroke. LKN was 2100 on Saturday night. She was being helped from the bathroom by family when she suddenly was unable to stand due to RLE weakness. Family also noted that she could not move her RUE, so 911 was called. Of note, she has chronic RUE and RLE weakness secondary to prior left MCA stroke. She gets botox injections to her RUE to improve mobility. In CT, the patient stated that her RUE was still weaker than normal, but that she was improving. Her RLE was back to normal per patient. She takes ASA daily. She is not on any anticoagulation. Stroke risk factors include history of stroke, CHF, HLD and HTN.LSN:2100. 04/02/17.tPA Given:No:Rapid symptomatic improvement. Risks of tPA outweigh benefits. MRI scan of the brain showed subacute tiny left parietal and cortex and white matter as well as smaller right frontal and parietal embolic infarcts. MRA of the brain showed no emergent large vessel occlusion or stenosis. Carotid ultrasound showed no significant extracranial stenosis. Transthoracic echo showed normal ejection fraction without cardiac source of embolism. Telemetry monitoring did not show cortex was of embolism. LDL cholesterol is 58 mg percent and hemoglobin A1c was 5.6. Patient strokes were felt to be embolic but she was not felt to be a candidate for  long-term anticoagulation given due to her frail body habitus, significant fall risk and advanced age hence TEE and prolonged cardiac monitoring were not done. Patient was transferred to inpatient rehabilitation where she made steady improvement and she is presently living at home with her husband. She is currently getting home health physical and occupational therapy. She is able to walk with a walker but the husband*walk behind her with a chair. She is starting Plavix well without bleeding or bruising. Her blood pressure is well controlled and today it is 110/78. She is tolerating Lipitor 40 mg well without muscle aches or pains. She's had no falls or injuries. She has a remote history of left internal capsule infarct in September 2017 with some residual right-sided weakness    ROS:   14 system review of systems is positive for  weakness, imbalance, gait difficulty , tremors and all other systems negative  PMH:  Past Medical History:  Diagnosis Date  . Acute blood loss anemia   . Adjustment disorder with depressed mood   . CVA (cerebral vascular accident) (HCC) 02/03/2016   Linear infarct within the posterior limb of left internal capsule  . Diastolic CHF (HCC)   . Dysphagia   . Dysphonia 02/20/2013  . Essential and other specified forms of tremor 02/20/2013  . HLD (hyperlipidemia)   . HTN (hypertension)   . OAB (overactive bladder)   . Osteoporosis   . Patient receiving subcutaneous heparin    For DVT prophylaxis 9/17  . Prediabetes   . Psoriasis   . Slow transit constipation   . Stroke (cerebrum) (HCC) 02/01/2016  . Trigeminal  neuralgia 02/20/2013    Social History:  Social History   Socioeconomic History  . Marital status: Married    Spouse name: Greggory [name]  . Number of children: 3  . Years of education: college  . Highest education level: Not on file  Social Needs  . Financial resource strain: Not very hard  . Food insecurity - worry: Patient refused  . Food insecurity -  inability: Patient refused  . Transportation needs - medical: Patient refused  . Transportation needs - non-medical: Patient refused  Occupational History  . Occupation: real estate    Comment: retired  Tobacco Use  . Smoking status: Never Smoker  . Smokeless tobacco: Never Used  Substance and Sexual Activity  . Alcohol use: Yes    Comment: Wine 2 glass daily  . Drug use: No  . Sexual activity: Not Currently    Partners: Male    Birth control/protection: Post-menopausal  Other Topics Concern  . Not on file  Social History Narrative   Lives at home with her husband.   Right-handed.   No caffeine use.    Medications:   Current Outpatient Medications on File Prior to Visit  Medication Sig Dispense Refill  . atorvastatin (LIPITOR) 40 MG tablet Take 1 tablet (40 mg total) daily at 6 PM by mouth. 30 tablet 0  . clopidogrel (PLAVIX) 75 MG tablet Take 1 tablet (75 mg total) daily by mouth. 120 tablet 0  . oxybutynin (DITROPAN) 5 MG tablet Take 1 tablet (5 mg total) at bedtime by mouth. 30 tablet 0  . topiramate (TOPAMAX) 50 MG tablet Take 1 tablet (50 mg total) 2 (two) times daily by mouth. 60 tablet 1   No current facility-administered medications on file prior to visit.     Allergies:  No Known Allergies  Physical Exam General: Frail cachectic malnourished-looking elderly Caucasian lady seated, in no evident distress Head: head normocephalic and atraumatic.   Neck: supple with no carotid or supraclavicular bruits Cardiovascular: regular rate and rhythm, no murmurs Musculoskeletal: no deformity Skin:  no rash/petichiae Vascular:  Normal pulses all extremities  Neurologic Exam Mental Status: Awake and fully alert. Oriented to place and time. Recent and remote memory diminished. Attention span, concentration and fund of knowledge diminished. Mood and affect appropriate.  Cranial Nerves: Fundoscopic exam reveals sharp disc margins. Pupils equal, briskly reactive to light.  Extraocular movements full without nystagmus. Visual fields full to confrontation. Hearing slightly diminished. Facial sensation intact. Mild right lower facial asymmetry, tongue, palate moves normally and symmetrically.  Motor: Mild spastic right hemiparesis with 3/5 right upper extremity strength with significant right grip weakness. Mild right hip exam ankle dorsiflexor weakness 4/5. Tone is increased on the right side compared to the left side. Normal strength in the left. Mild tremors of outstretched upper extremities with action but absent at rest. Sensory.: intact to touch , pinprick , position and vibratory sensation.  Coordination: Mildly impaired on the right and normal on the left. Gait and Station: Not tested as patient did not bring her walker and did not want to get up  Reflexes: 2+ and asymmetric and brisker on the right Toes downgoing.   NIHSS  3 Modified Rankin  3   ASSESSMENT: 38 year pleasant Caucasian lady with the by several tiny embolic infarcts of cryptogenic etiology in November 2018 with vascular risk factors of hyperlipidemia, prior stroke in 2017 and advanced age. TEE and prolonged cardiac monitoring were not done given high fall risk status    PLAN:  I had a long d/w patient and her husband about her recent cryptogenic  strokes, risk for recurrent stroke/TIAs, personally independently reviewed imaging studies and stroke evaluation results and answered questions.Continue Plavix  for secondary stroke prevention and maintain strict control of hypertension with blood pressure goal below 130/90, diabetes with hemoglobin A1c goal below 6.5% and lipids with LDL cholesterol goal below 70 mg/dL. I also advised the patient to eat a healthy diet with plenty of whole grains, cereals, fruits and vegetables, exercise regularly and maintain ideal body weight .we also discussed fall and safety precautions and I recommend she use a walker at all times and avoid falls and injuries. I also  recommend outpatient physical and occupational therapy after her home therapy finishes. Followup in the future with my nurse practitioner Shanda Bumps in 3 months or call earlier if necessary Delia Heady, MD Medical Director [facility] Pager: [phone] 07/05/2017 4:46 PM  Note: This document was prepared with digital dictation and possible smart phrase technology. Any transcriptional errors that result from this process are unintentional.

## 2017-07-06 DIAGNOSIS — M8008XD Age-related osteoporosis with current pathological fracture, vertebra(e), subsequent encounter for fracture with routine healing: Secondary | ICD-10-CM | POA: Diagnosis not present

## 2017-07-06 DIAGNOSIS — I5030 Unspecified diastolic (congestive) heart failure: Secondary | ICD-10-CM | POA: Diagnosis not present

## 2017-07-06 DIAGNOSIS — G500 Trigeminal neuralgia: Secondary | ICD-10-CM | POA: Diagnosis not present

## 2017-07-06 DIAGNOSIS — N319 Neuromuscular dysfunction of bladder, unspecified: Secondary | ICD-10-CM | POA: Diagnosis not present

## 2017-07-06 DIAGNOSIS — I110 Hypertensive heart disease with heart failure: Secondary | ICD-10-CM | POA: Diagnosis not present

## 2017-07-06 DIAGNOSIS — I69351 Hemiplegia and hemiparesis following cerebral infarction affecting right dominant side: Secondary | ICD-10-CM | POA: Diagnosis not present

## 2017-07-07 ENCOUNTER — Encounter: Payer: Self-pay | Admitting: Physical Medicine & Rehabilitation

## 2017-07-07 ENCOUNTER — Encounter: Payer: Medicare Other | Attending: Physical Medicine & Rehabilitation | Admitting: Physical Medicine & Rehabilitation

## 2017-07-07 VITALS — BP 100/61 | HR 70

## 2017-07-07 DIAGNOSIS — L409 Psoriasis, unspecified: Secondary | ICD-10-CM | POA: Diagnosis not present

## 2017-07-07 DIAGNOSIS — M810 Age-related osteoporosis without current pathological fracture: Secondary | ICD-10-CM | POA: Insufficient documentation

## 2017-07-07 DIAGNOSIS — I5030 Unspecified diastolic (congestive) heart failure: Secondary | ICD-10-CM | POA: Diagnosis not present

## 2017-07-07 DIAGNOSIS — I69351 Hemiplegia and hemiparesis following cerebral infarction affecting right dominant side: Secondary | ICD-10-CM | POA: Insufficient documentation

## 2017-07-07 DIAGNOSIS — F4321 Adjustment disorder with depressed mood: Secondary | ICD-10-CM | POA: Diagnosis not present

## 2017-07-07 DIAGNOSIS — I110 Hypertensive heart disease with heart failure: Secondary | ICD-10-CM | POA: Diagnosis not present

## 2017-07-07 DIAGNOSIS — G250 Essential tremor: Secondary | ICD-10-CM | POA: Diagnosis not present

## 2017-07-07 DIAGNOSIS — R7303 Prediabetes: Secondary | ICD-10-CM | POA: Insufficient documentation

## 2017-07-07 DIAGNOSIS — G8111 Spastic hemiplegia affecting right dominant side: Secondary | ICD-10-CM | POA: Diagnosis not present

## 2017-07-07 DIAGNOSIS — N3281 Overactive bladder: Secondary | ICD-10-CM | POA: Diagnosis not present

## 2017-07-07 DIAGNOSIS — E785 Hyperlipidemia, unspecified: Secondary | ICD-10-CM | POA: Diagnosis not present

## 2017-07-07 DIAGNOSIS — IMO0002 Reserved for concepts with insufficient information to code with codable children: Secondary | ICD-10-CM

## 2017-07-07 DIAGNOSIS — I639 Cerebral infarction, unspecified: Secondary | ICD-10-CM | POA: Diagnosis not present

## 2017-07-07 NOTE — Progress Notes (Signed)
Botox: Procedure Note Patient Name: Kelly [name] DOB: 10-29-1927 MRN: [id number]  Date: 07/07/17  Procedure: Botulinum toxin administration Guidance: EMG Diagnosis: G81.11 Attending: Maryla MorrowAnkit Kayra Crowell, MD    Trade name: Botox (onabotulinumtoxinA)  Informed consent: Risks, benefits & options of the procedure are explained to the patient (and/or family). The patient elects to proceed with procedure. Risks include but are not limited to weakness, respiratory distress, dry mouth, ptosis, antibody formation, worsening of some areas of function. Benefits include decreased abnormal muscle tone, improved hygiene and positioning, decreased skin breakdown and, in some cases, decreased pain. Options include conservative management with oral antispasticity agents, phenol chemodenervation of nerve or at motor nerve branches. More invasive options include intrathecal balcofen adminstration for appropriate candidates. Surgical options may include tendon lengthening or transposition or, rarely, dorsal rhizotomy.   History/Physical Examination: 84 y.o. spastic hemiplegia affecting right dominant side after left internal capsule CVA.  mAS  Right elbow flexors 1/4   Right wrist flexors 2/4   Right finger flexors 2/4 Previous Treatments: Failed Baclofen.  Therapy/Range of motion Indication for guidance: Target active muscules  Procedure: Botulinum toxin was mixed with preservative free saline with a dilution of 1cc to 100 units. Targeted limb and muscles were identified. The skin was prepped with alcohol swabs and placement of needle tip in targeted muscle was confirmed using appropriate guidance. Prior to injection, positioning of needle tip outside of blood vessel was determined by pulling back on syringe plunger.  MUSCLE UNITS: Right Biceps 50U Right FCR 50U Right FDS 80U Right FDP 20U  Total units used: 200  Complications: None Plan: Follow up in 6 weeks for Botox reeval  Kelly [name]  11:15 AM

## 2017-07-08 DIAGNOSIS — I110 Hypertensive heart disease with heart failure: Secondary | ICD-10-CM | POA: Diagnosis not present

## 2017-07-08 DIAGNOSIS — N319 Neuromuscular dysfunction of bladder, unspecified: Secondary | ICD-10-CM | POA: Diagnosis not present

## 2017-07-08 DIAGNOSIS — I69351 Hemiplegia and hemiparesis following cerebral infarction affecting right dominant side: Secondary | ICD-10-CM | POA: Diagnosis not present

## 2017-07-08 DIAGNOSIS — G500 Trigeminal neuralgia: Secondary | ICD-10-CM | POA: Diagnosis not present

## 2017-07-08 DIAGNOSIS — I5030 Unspecified diastolic (congestive) heart failure: Secondary | ICD-10-CM | POA: Diagnosis not present

## 2017-07-08 DIAGNOSIS — M8008XD Age-related osteoporosis with current pathological fracture, vertebra(e), subsequent encounter for fracture with routine healing: Secondary | ICD-10-CM | POA: Diagnosis not present

## 2017-07-09 DIAGNOSIS — G500 Trigeminal neuralgia: Secondary | ICD-10-CM | POA: Diagnosis not present

## 2017-07-09 DIAGNOSIS — M8008XD Age-related osteoporosis with current pathological fracture, vertebra(e), subsequent encounter for fracture with routine healing: Secondary | ICD-10-CM | POA: Diagnosis not present

## 2017-07-09 DIAGNOSIS — I69351 Hemiplegia and hemiparesis following cerebral infarction affecting right dominant side: Secondary | ICD-10-CM | POA: Diagnosis not present

## 2017-07-09 DIAGNOSIS — I110 Hypertensive heart disease with heart failure: Secondary | ICD-10-CM | POA: Diagnosis not present

## 2017-07-09 DIAGNOSIS — N319 Neuromuscular dysfunction of bladder, unspecified: Secondary | ICD-10-CM | POA: Diagnosis not present

## 2017-07-09 DIAGNOSIS — I5030 Unspecified diastolic (congestive) heart failure: Secondary | ICD-10-CM | POA: Diagnosis not present

## 2017-07-12 ENCOUNTER — Telehealth: Payer: Self-pay | Admitting: Neurology

## 2017-07-12 ENCOUNTER — Other Ambulatory Visit: Payer: Self-pay

## 2017-07-12 DIAGNOSIS — I639 Cerebral infarction, unspecified: Secondary | ICD-10-CM

## 2017-07-12 NOTE — Telephone Encounter (Signed)
Elizabeth with [facility] is calling to get order faxed for a right customed  AFO (please do assessment but consult with the outpatient PT prior to fabrication). [facility] [phone].

## 2017-07-12 NOTE — Telephone Encounter (Signed)
Rn [name]. [name] stated pt will be transiting  to outpatient therapy in two weeks. Pt has right foot drop from a previous stroke. Kelly ManisLizabeth wants a DME order for Right AFO custom. PT had a brace but did not like it. RN stated DR. SEthi is not in the office this week. Kelly Manislizabeth stated wants a call once the order is fax. Kelly [name] wants to coorelate with outpatient and bio tech  about when to make the brace

## 2017-07-12 NOTE — Telephone Encounter (Signed)
LEft vm for Kelly [name] at [phone] about needing AFO brace. Rn stated pt was seen 07/05/2017 for a stroke follow up. Rn requested a call back from At[place] to give more clarification.

## 2017-07-13 NOTE — Telephone Encounter (Signed)
DME order for right custom AFO fax to [phone]. Fax confirmed and receive.

## 2017-07-13 NOTE — Telephone Encounter (Signed)
Rn [facility] for right custom AFO. RN stated it will be fax to [employer]otech at [phone].

## 2017-07-26 ENCOUNTER — Encounter: Payer: Self-pay | Admitting: Physical Therapy

## 2017-07-26 ENCOUNTER — Ambulatory Visit: Payer: Medicare Other | Attending: Neurology | Admitting: Physical Therapy

## 2017-07-26 ENCOUNTER — Encounter: Payer: Self-pay | Admitting: Occupational Therapy

## 2017-07-26 ENCOUNTER — Ambulatory Visit: Payer: Medicare Other | Admitting: Occupational Therapy

## 2017-07-26 ENCOUNTER — Other Ambulatory Visit: Payer: Self-pay

## 2017-07-26 DIAGNOSIS — I69351 Hemiplegia and hemiparesis following cerebral infarction affecting right dominant side: Secondary | ICD-10-CM | POA: Diagnosis not present

## 2017-07-26 DIAGNOSIS — R2689 Other abnormalities of gait and mobility: Secondary | ICD-10-CM | POA: Insufficient documentation

## 2017-07-26 DIAGNOSIS — I69315 Cognitive social or emotional deficit following cerebral infarction: Secondary | ICD-10-CM | POA: Diagnosis not present

## 2017-07-26 DIAGNOSIS — R29818 Other symptoms and signs involving the nervous system: Secondary | ICD-10-CM | POA: Diagnosis not present

## 2017-07-26 DIAGNOSIS — M25641 Stiffness of right hand, not elsewhere classified: Secondary | ICD-10-CM | POA: Diagnosis not present

## 2017-07-26 DIAGNOSIS — R293 Abnormal posture: Secondary | ICD-10-CM | POA: Diagnosis not present

## 2017-07-26 DIAGNOSIS — R2681 Unsteadiness on feet: Secondary | ICD-10-CM

## 2017-07-26 DIAGNOSIS — M6281 Muscle weakness (generalized): Secondary | ICD-10-CM

## 2017-07-26 DIAGNOSIS — R278 Other lack of coordination: Secondary | ICD-10-CM | POA: Diagnosis not present

## 2017-07-26 DIAGNOSIS — M25611 Stiffness of right shoulder, not elsewhere classified: Secondary | ICD-10-CM | POA: Insufficient documentation

## 2017-07-26 NOTE — Therapy (Signed)
[facility] [address] [place], [place], [address] Phone: [phone]   Fax:  [phone]  Occupational Therapy Evaluation  Patient Details  Name: Kelly HumphreysBennie H [name] MRN: [id number] Date of Birth: 24-Nov-1927 Referring Provider: Dr Pearlean BrownieSethi   Encounter Date: 07/26/2017  OT End of Session - 07/26/17 1722    Visit Number  1    Number of Visits  17    Date for OT Re-Evaluation  09/24/17    OT Start Time  1445    OT Stop Time  1530    OT Time Calculation (min)  45 min    Activity Tolerance  Patient tolerated treatment well    Behavior During Therapy  [facility] for tasks assessed/performed       Past Medical History:  Diagnosis Date  . Acute blood loss anemia   . Adjustment disorder with depressed mood   . CVA (cerebral vascular accident) (HCC) 02/03/2016   Linear infarct within the posterior limb of left internal capsule  . Diastolic CHF (HCC)   . Dysphagia   . Dysphonia 02/20/2013  . Essential and other specified forms of tremor 02/20/2013  . HLD (hyperlipidemia)   . HTN (hypertension)   . OAB (overactive bladder)   . Osteoporosis   . Patient receiving subcutaneous heparin    For DVT prophylaxis 9/17  . Prediabetes   . Psoriasis   . Slow transit constipation   . Stroke (cerebrum) (HCC) 02/01/2016  . Trigeminal neuralgia 02/20/2013    Past Surgical History:  Procedure Laterality Date  . APPENDECTOMY  1940  . bladder tack    . CATARACT EXTRACTION Bilateral   . HEMORRHOID SURGERY  1960  . HUMERUS FRACTURE SURGERY    . LAPAROSCOPIC HYSTERECTOMY  2006  . torn rotator cuff    . WRIST FRACTURE SURGERY  2005   seconday shoulder 2001    There were no vitals filed for this visit.  Subjective Assessment - 07/26/17 1705    Subjective   I got botox in my hand and I can't tell much difference    Patient is accompained by:  Family member    Pertinent History  L MCA 01/2016, Multiple bilateral CVA's 03/2017, HTN, CHF     Currently in Pain?  No/denies    Multiple Pain Sites  No        [facility] OT Assessment - 07/26/17 1715      Cognition   Memory  Impaired                      OT Education - 07/26/17 1722    Education provided  Yes    Education Details  Reviewed results of OT eval and benefit of therapy following Botox injection    Person(s) Educated  Patient;Spouse    Methods  Explanation;Demonstration    Comprehension  Verbalized understanding       OT Short Term Goals - 07/26/17 1732      OT SHORT TERM GOAL #1   Title  Patient will complete a home exercise program designed to improve range of motion in right arm- with min prompting.  Due 08/25/17    Time  4    Period  Weeks    Status  New    Target Date  08/25/17      OT SHORT TERM GOAL #2   Title  Patient will complete a home activity program designed to increase functional use of right UE with min prompting  Time  4    Period  Weeks    Status  New      OT SHORT TERM GOAL #3   Title  Patient will stand at counter and remove 2 lightweight items (silverware, Geophysicist/field seismologist)  from lower rack of dishwasher with close supervision    Time  4    Period  Weeks    Status  New      OT SHORT TERM GOAL #4   Title  Patient will reach forward to to obtain a lightweight item (shirt) from lower surface while seated demonstrating 45 degrees of shoulder flexion with sufficient hand opening and grasp.      Baseline  -------------    Time  4    Period  Weeks    Status  New      OT SHORT TERM GOAL #5   Title  Pt will be able to cut food using bilateral upper extremities and min assist    Period  Weeks    Status  New        OT Long Term Goals - 07/26/17 1741      OT LONG TERM GOAL #1   Title  Patient will complete an HEP designed to improve RUE AROM/PROM with mod I due 5/3    Time  8    Period  Weeks    Status  New    Target Date  09/24/17      OT LONG TERM GOAL #2   Title  Patient will independently complete a home  activity program designed to improve right UE functional ability     Time  8    Period  Weeks    Status  New      OT LONG TERM GOAL #3   Title  Patient will legibly sign her name with RUE    Time  8    Period  Weeks    Status  New      OT LONG TERM GOAL #4   Title  Patient will load 2-3 lightweight items into dishwasher after each meal- using right upper extremity    Baseline  ------------------    Time  8    Period  Weeks    Status  New      OT LONG TERM GOAL #5   Title  Patient will use RUE to wipe down countertops in kitchen after meals to assist with clean up     Time  8    Period  Weeks    Status  New      Long Term Additional Goals   Additional Long Term Goals  Yes      OT LONG TERM GOAL #6   Title  Patient will wash lightweight dishes after meals using BUE's    Time  8    Period  Weeks    Status  New      OT LONG TERM GOAL #7   Title  Patient will demonstrate ability to use right arm to open cupboard doors and drawers in her home    Time  8    Period  Weeks    Status  New            Plan - 07/26/17 1750    Clinical Impression Statement  Patient is an 84 year old woman with new onset of bilateral CVA's on 03/2017.  She has received therapy in this clinic from a prior L MCA in 2017.  Patient referred for  OT after Botox injection to right elbow and forearm to decrease flexor spasticity in elbow, wrist and digits.  Patient has supportive husband and family, and was able to complete basic self care skills with only intermittent assistance or cueing from family members.  Currently she has assist for much of her ADL,and she does not participate in IADL's.  Patient is limited by decreased balance, decreased activity tolerance, decreased passive and active range of motion in right limbs, and decreased cognition - memory.  Patient had positive outcome from previos OT episodes, and she is motivated for improved independence with ADL/IADL.      Occupational Profile and  client history currently impacting functional performance  Wife, mother, fiercely independent prior to CVA's, active in church    Occupational performance deficits (Please refer to evaluation for details):  ADL's;IADL's;Social Participation;Leisure    Rehab Potential  Good    OT Frequency  2x / week    OT Duration  8 weeks    OT Treatment/Interventions  Self-care/ADL training;Moist Heat;Fluidtherapy;DME and/or AE instruction;Splinting;Balance training;Therapeutic activities;Therapeutic exercise;Cognitive remediation/compensation;Neuromuscular education;Functional Mobility Training;Patient/family education;Manual Therapy;Paraffin;Electrical Stimulation    Plan  Assess effectiveness of old splint, and initiate stretching program right UE    Clinical Decision Making  Several treatment options, min-mod task modification necessary       Patient will benefit from skilled therapeutic intervention in order to improve the following deficits and impairments:  Decreased coordination, Decreased range of motion, Difficulty walking, Impaired flexibility, Improper body mechanics, Decreased safety awareness, Increased edema, Impaired sensation, Decreased activity tolerance, Impaired tone, Decreased balance, Decreased knowledge of use of DME, Impaired UE functional use, Decreased cognition, Decreased mobility, Decreased strength, Impaired perceived functional ability  Visit Diagnosis: Spastic hemiplegia of right dominant side as late effect of cerebral infarction [facility]) - Plan: Ot plan of care cert/re-cert  Unsteadiness on feet - Plan: Ot plan of care cert/re-cert  Abnormal posture - Plan: Ot plan of care cert/re-cert  Muscle weakness (generalized) - Plan: Ot plan of care cert/re-cert  Stiffness of right hand, not elsewhere classified - Plan: Ot plan of care cert/re-cert  Stiffness of right shoulder, not elsewhere classified - Plan: Ot plan of care cert/re-cert  Cognitive social or emotional deficit  following cerebral infarction - Plan: Ot plan of care cert/re-cert    Problem List Patient Active Problem List   Diagnosis Date Noted  . Acute lower UTI   . Labile blood pressure   . Abnormal urinalysis   . Trauma   . Closed compression fracture of L4 lumbar vertebra (HCC)   . Neurogenic bladder   . History of hypertension   . Anemia of chronic disease   . Hypoalbuminemia due to protein-calorie malnutrition (HCC)   . Acute bilateral cerebral infarction in a watershed distribution 04/05/2017  . Benign essential HTN   . Chronic diastolic heart failure (HCC)   . Right spastic hemiparesis (HCC)   . History of stroke 02/16/2017  . Abnormality of gait following cerebrovascular accident (CVA) 02/16/2017  . Slow transit constipation   . Acute blood loss anemia   . Adjustment disorder with depressed mood   . Cerebrovascular accident (CVA) (HCC) 02/03/2016  . Dysarthria, post-stroke   . Dysphagia, post-stroke   . Leukocytosis   . Prediabetes   . Right hemiparesis (HCC)   . Cerebral infarction due to unspecified mechanism   . Other secondary hypertension   . Overactive bladder   . Diastolic dysfunction   . Hyperlipidemia   . Essential hypertension   .  Essential tremor   . Stroke (cerebrum) (HCC) 02/01/2016  . Facial droop due to stroke 02/01/2016  . Essential and other specified forms of tremor 02/20/2013  . Dysphonia 02/20/2013  . Trigeminal neuralgia 02/20/2013    Collier Salina, OTR/L 07/26/2017, 5:53 PM  [facility] [address] [place], [place], [address] Phone: [phone]   Fax:  [phone]  Name: MEGHEN AKOPYAN MRN: [id number] Date of Birth: 1927/11/18

## 2017-07-26 NOTE — Therapy (Signed)
[facility] [address] [place], [place], [address] Phone: [phone]   Fax:  [phone]  Physical Therapy Evaluation  Patient Details  Name: Kelly [name] MRN: [id number] Date of Birth: [birth year] Referring Provider: Dr Pearlean Brownie   Encounter Date: 07/26/2017  PT End of Session - 07/26/17 2112    Visit Number  1    Number of Visits  17    Date for PT Re-Evaluation  09/24/17    Authorization Type  Medicare/[facility]    Authorization Time Period  07-26-17 - 09-24-17    PT Start Time  1405    PT Stop Time  1447    PT Time Calculation (min)  42 min       Past Medical History:  Diagnosis Date  . Acute blood loss anemia   . Adjustment disorder with depressed mood   . CVA (cerebral vascular accident) (HCC) 02/03/2016   Linear infarct within the posterior limb of left internal capsule  . Diastolic CHF (HCC)   . Dysphagia   . Dysphonia 02/20/2013  . Essential and other specified forms of tremor 02/20/2013  . HLD (hyperlipidemia)   . HTN (hypertension)   . OAB (overactive bladder)   . Osteoporosis   . Patient receiving subcutaneous heparin    For DVT prophylaxis 9/17  . Prediabetes   . Psoriasis   . Slow transit constipation   . Stroke (cerebrum) (HCC) 02/01/2016  . Trigeminal neuralgia 02/20/2013    Past Surgical History:  Procedure Laterality Date  . APPENDECTOMY  1940  . bladder tack    . CATARACT EXTRACTION Bilateral   . HEMORRHOID SURGERY  1960  . HUMERUS FRACTURE SURGERY    . LAPAROSCOPIC HYSTERECTOMY  2006  . torn rotator cuff    . WRIST FRACTURE SURGERY  2005   seconday shoulder 2001    There were no vitals filed for this visit.   Subjective Assessment - 07/26/17 2055    Subjective  Pt presents to PT accompanied by husband; pt amb. with use of RW - he states her home health PT Ranae Palms) wants to discuss the AFO consult scheduled at [facility] tomorrow (on 07-27-17)    Patient is accompained by:   Family member    Pertinent History  Rt MCA CVA on 02-03-16:  Cryptogenic CVA on 04-05-17; pt received OP PT at this facility from 06-01-16 - 11-19-16    Patient Stated Goals  "I would like to walk in the park"     Currently in Pain?  No/denies         [facility] PT Assessment - 07/26/17 1418      Assessment   Medical Diagnosis  Cryptogenic CVA    Referring Provider  Dr. Delia Heady    Onset Date/Surgical Date  04/05/17      Precautions   Precautions  Fall    Required Braces or Orthoses  Other Brace/Splint Rt hand orthosis on RW      Restrictions   Weight Bearing Restrictions  No      Balance Screen   Has the patient fallen in the past 6 months  Yes No falls since Nov. 2018    How many times?  1 in Sept.       Home Environment   Living Environment  Private residence    Type of Home  House    Home Access  Stairs to enter    Entrance Stairs-Number of Steps  2    Entrance  Stairs-Rails  Right    Home Layout  Two level    Alternate Level Stairs-Number of Steps  12    Additional Comments  Pt states she does not go upstairs      Prior Function   Level of Independence  Requires assistive device for independence;Needs assistance with ADLs;Needs assistance with homemaking;Needs assistance with gait      AROM   Overall AROM   Deficits    AROM Assessment Site  Hip;Knee;Ankle    Right/Left Hip  Right    Right/Left Knee  Right    Right Knee Extension  -30    Right/Left Ankle  Right      PROM   Overall PROM Comments  Rt fingers are flexed due to spasticity - pt uses Lt hand to extend fingers passively      Strength   Overall Strength  Deficits    Strength Assessment Site  Hip;Knee;Ankle    Right/Left Hip  Right    Right Hip Flexion  3-/5    Right/Left Knee  Right    Right Knee Flexion  3-/5    Right Knee Extension  4-/5    Right/Left Ankle  Right    Right Ankle Dorsiflexion  2+/5    Right Ankle Plantar Flexion  2/5    Right Ankle Inversion  2+/5    Right Ankle Eversion  2+/5       Transfers   Transfers  Sit to Stand    Comments  pt uses UE support and initially braced legs against mat       Ambulation/Gait   Ambulation/Gait  Yes    Ambulation/Gait Assistance  4: Min guard    Ambulation Distance (Feet)  75 Feet    Assistive device  Rolling walker hand orthosis on Rt side     Gait Pattern  Decreased hip/knee flexion - right;Decreased dorsiflexion - right;Decreased weight shift to right;Decreased step length - right    Ambulation Surface  Level;Indoor    Gait velocity  61 secs = .54 ft/sec with RW       Orthotic Training:   Toe off AFO was trialed on RLE - increased Rt foot clearance was achieved in swing through but Rt knee hyperextension noted in stance, which appeared to  Increase with fatigue; pt gait trained 100' with RW with toe off AFO on RLE with use of RW with CGA     Objective measurements completed on examination: See above findings.                PT Short Term Goals - 07/26/17 2130      PT SHORT TERM GOAL #1   Title  Pt will perform sit to stand 5 reps from mat without UE support to demo improved LE strength.    Baseline  UE support needed    Time  4    Period  Weeks    Status  New    Target Date  08/25/17      PT SHORT TERM GOAL #2   Title  Pt will receive consult for custom AFO for RLE.    Baseline  scheduled at [facility] on 07-27-17    Time  4    Period  Weeks    Status  New    Target Date  08/25/17      PT SHORT TERM GOAL #3   Title  Pt will stand for at least 2" without UE support with SBA to increase independence with ADL's.  Baseline   UE support needed    Time  4    Period  Weeks    Status  New    Target Date  08/25/17      PT SHORT TERM GOAL #4   Title  Incr. gait velocity to >/= .9 ft/sec with RW for incr. gait efficiency.    Baseline  .54 ft/sec with RW (no AFO used)    Time  4    Period  Weeks    Status  New    Target Date  08/25/17      PT SHORT TERM GOAL #5   Title  Pt will amb. with RW 350'  with SBA on flat, even surface.    Baseline  77' with RW    Time  4    Period  Weeks    Status  New    Target Date  08/25/17        PT Long Term Goals - 07/26/17 2142      PT LONG TERM GOAL #1   Title  Pt will increase gait velocity from .54 ft/sec to >/= 1.2 ft/sec with RW with AFO on RLE for incr. gait efficiency.    Baseline  .54 ft/sec with RW    Time  8    Period  Weeks    Status  New    Target Date  09/24/17      PT LONG TERM GOAL #2   Title  Pt will stand for at least 10" with UE support prn and reach 6" outside BOS without LOB for incr. independence and safety with ADL's.    Time  8    Period  Weeks    Status  New    Target Date  09/24/17      PT LONG TERM GOAL #3   Title  Pt will negotiate 4 steps with Lt hand rail with CGA using a step by step sequence.    Time  8    Period  Weeks    Status  New    Target Date  09/24/17      PT LONG TERM GOAL #4   Title  Independent in HEP for RLE strengthening and balance exercises.    Time  8    Period  Weeks    Status  New    Target Date  09/24/17      PT LONG TERM GOAL #5   Title  Pt will amb. 500' outside with RW with AFO on RLE with SBA to allow pt to return walking in the park per her stated goal.    Time  8    Period  Weeks    Status  New    Target Date  09/24/17             Plan - 07/26/17 2113    Clinical Impression Statement  Pt is an 84 yr old lady s/p cerebellar infarcts on 04-05-17.  Pt was hospitalized from 04-05-17 until 04-13-17.  Pt received home health in Dec. - Feb.  Pt has h/o CVA in Sept. 2017 and received OP PT from 06-01-16 until 11-19-16.  Pt presents with Rt spastic hemiparesis and is amb. with RW with hand orthosis to maintain hand placement on RW.  Pt exhibits foot drop on RLE and states she has consult for AFO scheduled at [facility] on 07-27-17.  Pt presents with standing balance deficits and RLE weakness and decreased strength and coordination.  Pt also presents with  spasticity in RUE and RLE.                                                                                                                          History and Personal Factors relevant to plan of care:  h/o CVA in Sept. 2017:  cerebellar infarcts (cryptogenic CVA on 04-05-17) with Rt hemiparesis and spasticity    Clinical Presentation  Evolving    Clinical Presentation due to:  cerebellar infarcts 04-05-17    Clinical Decision Making  Moderate    Rehab Potential  Good    Clinical Impairments Affecting Rehab Potential  severity of deficits with h/o prior CVA with Rt hemiparesis    PT Frequency  2x / week    PT Duration  4 weeks    PT Treatment/Interventions  ADLs/Self Care Home Management;Stair training;Gait training;DME Instruction;Therapeutic activities;Therapeutic exercise;Balance training;Neuromuscular re-education;Patient/family education;Orthotic Fit/Training;Passive range of motion    PT Next Visit Plan  HEP for RLE strengthening - sit to stand, bridges, SLR, hip abduction in hooklying: standing balance as appropriate    PT Home Exercise Plan  see above    Recommended Other Services  pt has AFO consult scheduled at [facility] on Tues, 07-27-17    Consulted and Agree with Plan of Care  Patient;Family member/caregiver    Family Member Consulted  husband        Patient will benefit from skilled therapeutic intervention in order to improve the following deficits and impairments:  Abnormal gait, Decreased endurance, Decreased activity tolerance, Decreased balance, Decreased coordination, Decreased range of motion, Decreased strength, Impaired tone, Impaired UE functional use, Postural dysfunction  Visit Diagnosis: Hemiplegia and hemiparesis following cerebral infarction affecting right dominant side (HCC) - Plan: PT plan of care cert/re-cert  Other abnormalities of gait and mobility - Plan: PT plan of care cert/re-cert  Unsteadiness on feet - Plan: PT plan of care cert/re-cert  Other lack of coordination - Plan: PT plan  of care cert/re-cert  Other symptoms and signs involving the nervous system - Plan: PT plan of care cert/re-cert     Problem List Patient Active Problem List   Diagnosis Date Noted  . Acute lower UTI   . Labile blood pressure   . Abnormal urinalysis   . Trauma   . Closed compression fracture of L4 lumbar vertebra (HCC)   . Neurogenic bladder   . History of hypertension   . Anemia of chronic disease   . Hypoalbuminemia due to protein-calorie malnutrition (HCC)   . Acute bilateral cerebral infarction in a watershed distribution 04/05/2017  . Benign essential HTN   . Chronic diastolic heart failure (HCC)   . Right spastic hemiparesis (HCC)   . History of stroke 02/16/2017  . Abnormality of gait following cerebrovascular accident (CVA) 02/16/2017  . Slow transit constipation   . Acute blood loss anemia   . Adjustment disorder with depressed mood   . Cerebrovascular accident (CVA) (HCC) 02/03/2016  . Dysarthria, post-stroke   . Dysphagia,  post-stroke   . Leukocytosis   . Prediabetes   . Right hemiparesis (HCC)   . Cerebral infarction due to unspecified mechanism   . Other secondary hypertension   . Overactive bladder   . Diastolic dysfunction   . Hyperlipidemia   . Essential hypertension   . Essential tremor   . Stroke (cerebrum) (HCC) 02/01/2016  . Facial droop due to stroke 02/01/2016  . Essential and other specified forms of tremor 02/20/2013  . Dysphonia 02/20/2013  . Trigeminal neuralgia 02/20/2013    Kary Kos, PT 07/26/2017, 9:56 PM  [facility] [address] [place], [place], [address] Phone: [phone]   Fax:  [phone]  Name: Kelly [name] MRN: [id number] Date of Birth: 26-Feb-1928

## 2017-07-28 ENCOUNTER — Encounter: Payer: Self-pay | Admitting: Occupational Therapy

## 2017-07-28 ENCOUNTER — Ambulatory Visit: Payer: Medicare Other | Admitting: Occupational Therapy

## 2017-07-28 DIAGNOSIS — R293 Abnormal posture: Secondary | ICD-10-CM

## 2017-07-28 DIAGNOSIS — R29818 Other symptoms and signs involving the nervous system: Secondary | ICD-10-CM | POA: Diagnosis not present

## 2017-07-28 DIAGNOSIS — M6281 Muscle weakness (generalized): Secondary | ICD-10-CM | POA: Diagnosis not present

## 2017-07-28 DIAGNOSIS — R2681 Unsteadiness on feet: Secondary | ICD-10-CM

## 2017-07-28 DIAGNOSIS — I69351 Hemiplegia and hemiparesis following cerebral infarction affecting right dominant side: Secondary | ICD-10-CM

## 2017-07-28 DIAGNOSIS — R278 Other lack of coordination: Secondary | ICD-10-CM | POA: Diagnosis not present

## 2017-07-28 DIAGNOSIS — M25611 Stiffness of right shoulder, not elsewhere classified: Secondary | ICD-10-CM

## 2017-07-28 DIAGNOSIS — M25641 Stiffness of right hand, not elsewhere classified: Secondary | ICD-10-CM

## 2017-07-28 DIAGNOSIS — R2689 Other abnormalities of gait and mobility: Secondary | ICD-10-CM | POA: Diagnosis not present

## 2017-07-28 NOTE — Therapy (Signed)
[facility] [address] [place], [place], [address] Phone: [phone]   Fax:  [phone]  Occupational Therapy Treatment  Patient Details  Name: Kelly HumphreysBennie H Kalbfleisch MRN: [id number] Date of Birth: [birth year] Referring Provider: Dr Pearlean BrownieSethi   Encounter Date: 07/28/2017  OT End of Session - 07/28/17 1639    Visit Number  2    Number of Visits  17    Date for OT Re-Evaluation  09/24/17    OT Start Time  1533    OT Stop Time  1616    OT Time Calculation (min)  43 min    Activity Tolerance  Patient tolerated treatment well    Behavior During Therapy  [facility] for tasks assessed/performed       Past Medical History:  Diagnosis Date  . Acute blood loss anemia   . Adjustment disorder with depressed mood   . CVA (cerebral vascular accident) (HCC) 02/03/2016   Linear infarct within the posterior limb of left internal capsule  . Diastolic CHF (HCC)   . Dysphagia   . Dysphonia 02/20/2013  . Essential and other specified forms of tremor 02/20/2013  . HLD (hyperlipidemia)   . HTN (hypertension)   . OAB (overactive bladder)   . Osteoporosis   . Patient receiving subcutaneous heparin    For DVT prophylaxis 9/17  . Prediabetes   . Psoriasis   . Slow transit constipation   . Stroke (cerebrum) (HCC) 02/01/2016  . Trigeminal neuralgia 02/20/2013    Past Surgical History:  Procedure Laterality Date  . APPENDECTOMY  1940  . bladder tack    . CATARACT EXTRACTION Bilateral   . HEMORRHOID SURGERY  1960  . HUMERUS FRACTURE SURGERY    . LAPAROSCOPIC HYSTERECTOMY  2006  . torn rotator cuff    . WRIST FRACTURE SURGERY  2005   seconday shoulder 2001    There were no vitals filed for this visit.  Subjective Assessment - 07/28/17 1629    Subjective   I do some exercises with a 1 lb weight    Patient is accompained by:  Family member    Pertinent History  L MCA 01/2016, Multiple bilateral CVA's 03/2017, HTN, CHF    Currently in Pain?   No/denies    Multiple Pain Sites  No                   OT Treatments/Exercises (OP) - 07/28/17 0001      ADLs   ADL Comments  Reviewed short and long term goals with patient and husband.  Patient very eager to return to writing, so she can sign her name, write her address, and a grocery list.        Neurological Re-education Exercises   Other Exercises 1  Supine to stretch glenohumeral joint capsule, and to lengthen pectoralis muscle.  Patient with incorrect initiation of all movement with shoulder elevation, and internal rotation.  Able to begin to relax pects with tactile and verbal cueing.  In sidelying worked on balance of muscle activity for elbow flex/ext, sup/pronation, wrist flex/ext.  Worked on hand to mouth pattern.  Patient with poorly graded control of elbow flexion.  Patient lacks full passive range of elbow extension.      Other Exercises 2  In sitting worked on long arm weight bearing, and moving body over right arm to train better mechanics in preparation for reach.  Followed with active digit extension en masse.  Splinting   Splinting  Assessed patient's prior resting hand splint, and reinforced starpping.  Patient has an additional hand splint they will bring to next session.               OT Education - 07/28/17 1637    Education provided  Yes    Education Details  Reviewed goals- patient in agreement.  Patient to focus on reducing shoulder elevation with attempts to move distal right arm.      Person(s) Educated  Patient;Spouse    Methods  Explanation;Demonstration       OT Short Term Goals - 07/28/17 1641      OT SHORT TERM GOAL #1   Title  Patient will complete a home exercise program designed to improve range of motion in right arm- with min prompting.  Due 08/25/17    Status  On-going      OT SHORT TERM GOAL #2   Title  Patient will complete a home activity program designed to increase functional use of right UE with min prompting     Status  On-going      OT SHORT TERM GOAL #3   Title  Patient will stand at counter and remove 2 lightweight items (silverware, lightweight saucer)  from lower rack of dishwasher with close supervision    Status  On-going      OT SHORT TERM GOAL #4   Title  Patient will reach forward to to obtain a lightweight item (shirt) from lower surface while seated demonstrating 45 degrees of shoulder flexion with sufficient hand opening and grasp.      Status  On-going      OT SHORT TERM GOAL #5   Title  Pt will be able to cut food using bilateral upper extremities and min assist    Status  On-going      Additional Short Term Goals   Additional Short Term Goals  Yes      OT SHORT TERM GOAL #6   Title  Patient will bring finger food to mouth once placed in right hand    Time  4    Period  Weeks    Status  New        OT Long Term Goals - 07/28/17 1641      OT LONG TERM GOAL #1   Title  Patient will complete an HEP designed to improve RUE AROM/PROM with mod I due 5/3    Status  On-going      OT LONG TERM GOAL #2   Title  Patient will independently complete a home activity program designed to improve right UE functional ability     Status  On-going      OT LONG TERM GOAL #3   Title  Patient will legibly sign her name with RUE and write a short grocery list    Status  On-going      OT LONG TERM GOAL #4   Title  Patient will load 2-3 lightweight items into dishwasher after each meal- using right upper extremity    Status  On-going      OT LONG TERM GOAL #5   Title  Patient will use RUE to wipe down countertops in kitchen after meals to assist with clean up     Status  On-going      OT LONG TERM GOAL #6   Title  Patient will wash lightweight dishes after meals using BUE's    Status  On-going  OT LONG TERM GOAL #7   Title  Patient will demonstrate ability to use right arm to open cupboard doors and drawers in her home    Status  On-going            Plan - 07/28/17 1639     Clinical Impression Statement  Patient is very eager for improved functional use of her right arm.      Occupational Profile and client history currently impacting functional performance  Wife, mother, fiercely independent prior to CVA's, active in church    Occupational performance deficits (Please refer to evaluation for details):  ADL's;IADL's;Social Participation;Leisure    Rehab Potential  Good    OT Frequency  2x / week    OT Duration  8 weeks    OT Treatment/Interventions  Self-care/ADL training;Moist Heat;Fluidtherapy;DME and/or AE instruction;Splinting;Balance training;Therapeutic activities;Therapeutic exercise;Cognitive remediation/compensation;Neuromuscular education;Functional Mobility Training;Patient/family education;Manual Therapy;Paraffin;Electrical Stimulation    Plan  Husband to bring in exercise program, and additional splint for assessment.  NMR RUE    Clinical Decision Making  Several treatment options, min-mod task modification necessary    OT Home Exercise Plan  Patient will bring prior HEP for review    Consulted and Agree with Plan of Care  Patient;Family member/caregiver       Patient will benefit from skilled therapeutic intervention in order to improve the following deficits and impairments:  Decreased coordination, Decreased range of motion, Difficulty walking, Impaired flexibility, Improper body mechanics, Decreased safety awareness, Increased edema, Impaired sensation, Decreased activity tolerance, Impaired tone, Decreased balance, Decreased knowledge of use of DME, Impaired UE functional use, Decreased cognition, Decreased mobility, Decreased strength, Impaired perceived functional ability  Visit Diagnosis: Spastic hemiplegia of right dominant side as late effect of cerebral infarction (HCC)  Unsteadiness on feet  Abnormal posture  Muscle weakness (generalized)  Stiffness of right hand, not elsewhere classified  Stiffness of right shoulder, not  elsewhere classified    Problem List Patient Active Problem List   Diagnosis Date Noted  . Acute lower UTI   . Labile blood pressure   . Abnormal urinalysis   . Trauma   . Closed compression fracture of L4 lumbar vertebra (HCC)   . Neurogenic bladder   . History of hypertension   . Anemia of chronic disease   . Hypoalbuminemia due to protein-calorie malnutrition (HCC)   . Acute bilateral cerebral infarction in a watershed distribution 04/05/2017  . Benign essential HTN   . Chronic diastolic heart failure (HCC)   . Right spastic hemiparesis (HCC)   . History of stroke 02/16/2017  . Abnormality of gait following cerebrovascular accident (CVA) 02/16/2017  . Slow transit constipation   . Acute blood loss anemia   . Adjustment disorder with depressed mood   . Cerebrovascular accident (CVA) (HCC) 02/03/2016  . Dysarthria, post-stroke   . Dysphagia, post-stroke   . Leukocytosis   . Prediabetes   . Right hemiparesis (HCC)   . Cerebral infarction due to unspecified mechanism   . Other secondary hypertension   . Overactive bladder   . Diastolic dysfunction   . Hyperlipidemia   . Essential hypertension   . Essential tremor   . Stroke (cerebrum) (HCC) 02/01/2016  . Facial droop due to stroke 02/01/2016  . Essential and other specified forms of tremor 02/20/2013  . Dysphonia 02/20/2013  . Trigeminal neuralgia 02/20/2013    Collier Salina, OTR/L 07/28/2017, 4:43 PM  [place] [facility] [address] [place], [place], [address] Phone: [phone]  Fax:  [phone]  Name: CARESS REFFITT MRN: [id number] Date of Birth: 01-14-1928

## 2017-08-02 ENCOUNTER — Ambulatory Visit: Payer: Medicare Other | Admitting: Physical Therapy

## 2017-08-02 ENCOUNTER — Ambulatory Visit: Payer: Medicare Other | Admitting: Occupational Therapy

## 2017-08-02 ENCOUNTER — Encounter: Payer: Self-pay | Admitting: Occupational Therapy

## 2017-08-02 DIAGNOSIS — M6281 Muscle weakness (generalized): Secondary | ICD-10-CM | POA: Diagnosis not present

## 2017-08-02 DIAGNOSIS — M25611 Stiffness of right shoulder, not elsewhere classified: Secondary | ICD-10-CM

## 2017-08-02 DIAGNOSIS — R2681 Unsteadiness on feet: Secondary | ICD-10-CM

## 2017-08-02 DIAGNOSIS — R278 Other lack of coordination: Secondary | ICD-10-CM | POA: Diagnosis not present

## 2017-08-02 DIAGNOSIS — R2689 Other abnormalities of gait and mobility: Secondary | ICD-10-CM | POA: Diagnosis not present

## 2017-08-02 DIAGNOSIS — I69351 Hemiplegia and hemiparesis following cerebral infarction affecting right dominant side: Secondary | ICD-10-CM | POA: Diagnosis not present

## 2017-08-02 DIAGNOSIS — R293 Abnormal posture: Secondary | ICD-10-CM

## 2017-08-02 DIAGNOSIS — I69315 Cognitive social or emotional deficit following cerebral infarction: Secondary | ICD-10-CM

## 2017-08-02 DIAGNOSIS — R29818 Other symptoms and signs involving the nervous system: Secondary | ICD-10-CM | POA: Diagnosis not present

## 2017-08-02 DIAGNOSIS — M25641 Stiffness of right hand, not elsewhere classified: Secondary | ICD-10-CM

## 2017-08-02 NOTE — Therapy (Signed)
[facility] [address] [place], [place], [address] Phone: [phone]   Fax:  [phone]  Occupational Therapy Treatment  Patient Details  Name: Kelly [name] MRN: [id number] Date of Birth: 04-04-1928 Referring Provider: Dr Pearlean BrownieSethi   Encounter Date: 08/02/2017  OT End of Session - 08/02/17 1649    Visit Number  3    Number of Visits  17    Date for OT Re-Evaluation  09/24/17    Authorization Type  medicare    Authorization Time Period  60 days    OT Start Time  1402    OT Stop Time  1445    OT Time Calculation (min)  43 min    Activity Tolerance  Patient tolerated treatment well       Past Medical History:  Diagnosis Date  . Acute blood loss anemia   . Adjustment disorder with depressed mood   . CVA (cerebral vascular accident) (HCC) 02/03/2016   Linear infarct within the posterior limb of left internal capsule  . Diastolic CHF (HCC)   . Dysphagia   . Dysphonia 02/20/2013  . Essential and other specified forms of tremor 02/20/2013  . HLD (hyperlipidemia)   . HTN (hypertension)   . OAB (overactive bladder)   . Osteoporosis   . Patient receiving subcutaneous heparin    For DVT prophylaxis 9/17  . Prediabetes   . Psoriasis   . Slow transit constipation   . Stroke (cerebrum) (HCC) 02/01/2016  . Trigeminal neuralgia 02/20/2013    Past Surgical History:  Procedure Laterality Date  . APPENDECTOMY  1940  . bladder tack    . CATARACT EXTRACTION Bilateral   . HEMORRHOID SURGERY  1960  . HUMERUS FRACTURE SURGERY    . LAPAROSCOPIC HYSTERECTOMY  2006  . torn rotator cuff    . WRIST FRACTURE SURGERY  2005   seconday shoulder 2001    There were no vitals filed for this visit.  Subjective Assessment - 08/02/17 1404    Subjective   I can't use that hand splint on the walker    Patient is accompained by:  Family member husband    Pertinent History  L MCA 01/2016, Multiple bilateral CVA's 03/2017, HTN, CHF     Currently in Pain?  No/denies                   OT Treatments/Exercises (OP) - 08/02/17 0001      ADLs   Toileting  Addressed toilet transfers and toileting today with pt - pt required only supervision with RW      Splinting   Splinting  Pt unable to use standard walker splint as pt reports it hurts her thumb and makes her hand hurt.  Fabricated custom walker splint for pt and pt able to use without difficulty.  Pt and husband educated on how pt will access it .  Pt able to ambulate to bathroom and back using custom splint. Pt will also need new resting hand splint due to increased tone since last splint was made.  Pt and husband in agreement.                OT Short Term Goals - 08/02/17 1647      OT SHORT TERM GOAL #1   Title  Patient will complete a home exercise program designed to improve range of motion in right arm- with min prompting.  Due 08/25/17    Status  On-going  OT SHORT TERM GOAL #2   Title  Patient will complete a home activity program designed to increase functional use of right UE with min prompting    Status  On-going      OT SHORT TERM GOAL #3   Title  Patient will stand at counter and remove 2 lightweight items (silverware, lightweight saucer)  from lower rack of dishwasher with close supervision    Status  On-going      OT SHORT TERM GOAL #4   Title  Patient will reach forward to to obtain a lightweight item (shirt) from lower surface while seated demonstrating 45 degrees of shoulder flexion with sufficient hand opening and grasp.      Status  On-going      OT SHORT TERM GOAL #5   Title  Pt will be able to cut food using bilateral upper extremities and min assist    Status  On-going      OT SHORT TERM GOAL #6   Title  Patient will bring finger food to mouth once placed in right hand    Time  4    Period  Weeks    Status  New        OT Long Term Goals - 08/02/17 1647      OT LONG TERM GOAL #1   Title  Patient will complete  an HEP designed to improve RUE AROM/PROM with mod I due 5/3    Status  On-going      OT LONG TERM GOAL #2   Title  Patient will independently complete a home activity program designed to improve right UE functional ability     Status  On-going      OT LONG TERM GOAL #3   Title  Patient will legibly sign her name with RUE and write a short grocery list    Status  On-going      OT LONG TERM GOAL #4   Title  Patient will load 2-3 lightweight items into dishwasher after each meal- using right upper extremity    Status  On-going      OT LONG TERM GOAL #5   Title  Patient will use RUE to wipe down countertops in kitchen after meals to assist with clean up     Status  On-going      OT LONG TERM GOAL #6   Title  Patient will wash lightweight dishes after meals using BUE's    Status  On-going      OT LONG TERM GOAL #7   Title  Patient will demonstrate ability to use right arm to open cupboard doors and drawers in her home    Status  On-going            Plan - 08/02/17 1647    Clinical Impression Statement  Pt progressing toward goals. Pt with improved ability to control walker with custom walker splint.    Occupational Profile and client history currently impacting functional performance  Wife, mother, fiercely independent prior to CVA's, active in church    Occupational performance deficits (Please refer to evaluation for details):  ADL's;IADL's;Social Participation;Leisure    Rehab Potential  Good    OT Frequency  2x / week    OT Duration  8 weeks    OT Treatment/Interventions  Self-care/ADL training;Moist Heat;Fluidtherapy;DME and/or AE instruction;Splinting;Balance training;Therapeutic activities;Therapeutic exercise;Cognitive remediation/compensation;Neuromuscular education;Functional Mobility Training;Patient/family education;Manual Therapy;Paraffin;Electrical Stimulation    Plan  Husband to bring in exercise program; fabricate new resting hand splint, NMR  RUE/trunk/functional  mobility    Consulted and Agree with Plan of Care  Patient;Family member/caregiver    Family Member Consulted  husband       Patient will benefit from skilled therapeutic intervention in order to improve the following deficits and impairments:  Decreased coordination, Decreased range of motion, Difficulty walking, Impaired flexibility, Improper body mechanics, Decreased safety awareness, Increased edema, Impaired sensation, Decreased activity tolerance, Impaired tone, Decreased balance, Decreased knowledge of use of DME, Impaired UE functional use, Decreased cognition, Decreased mobility, Decreased strength, Impaired perceived functional ability  Visit Diagnosis: Spastic hemiplegia of right dominant side as late effect of cerebral infarction (HCC)  Unsteadiness on feet  Abnormal posture  Muscle weakness (generalized)  Stiffness of right hand, not elsewhere classified  Stiffness of right shoulder, not elsewhere classified  Cognitive social or emotional deficit following cerebral infarction    Problem List Patient Active Problem List   Diagnosis Date Noted  . Acute lower UTI   . Labile blood pressure   . Abnormal urinalysis   . Trauma   . Closed compression fracture of L4 lumbar vertebra (HCC)   . Neurogenic bladder   . History of hypertension   . Anemia of chronic disease   . Hypoalbuminemia due to protein-calorie malnutrition (HCC)   . Acute bilateral cerebral infarction in a watershed distribution 04/05/2017  . Benign essential HTN   . Chronic diastolic heart failure (HCC)   . Right spastic hemiparesis (HCC)   . History of stroke 02/16/2017  . Abnormality of gait following cerebrovascular accident (CVA) 02/16/2017  . Slow transit constipation   . Acute blood loss anemia   . Adjustment disorder with depressed mood   . Cerebrovascular accident (CVA) (HCC) 02/03/2016  . Dysarthria, post-stroke   . Dysphagia, post-stroke   . Leukocytosis   . Prediabetes   . Right  hemiparesis (HCC)   . Cerebral infarction due to unspecified mechanism   . Other secondary hypertension   . Overactive bladder   . Diastolic dysfunction   . Hyperlipidemia   . Essential hypertension   . Essential tremor   . Stroke (cerebrum) (HCC) 02/01/2016  . Facial droop due to stroke 02/01/2016  . Essential and other specified forms of tremor 02/20/2013  . Dysphonia 02/20/2013  . Trigeminal neuralgia 02/20/2013    Norton Pastel, OTR/L 08/02/2017, 4:50 PM  [place] [facility] [address] [place], [place], [address] Phone: [phone]   Fax:  [phone]  Name: Kelly [name] MRN: [id number] Date of Birth: [birth year]

## 2017-08-03 ENCOUNTER — Encounter: Payer: Self-pay | Admitting: Physical Therapy

## 2017-08-03 NOTE — Therapy (Signed)
[facility] [address] [place], [place], [address] Phone: [phone]   Fax:  [phone]  Physical Therapy Treatment  Patient Details  Name: Kelly [name] MRN: [id number] Date of Birth: 25-Aug-1927 Referring Provider: Dr Pearlean Brownie   Encounter Date: 08/02/2017  PT End of Session - 08/03/17 0855    Visit Number  2    Number of Visits  17    Date for PT Re-Evaluation  09/24/17    Authorization Type  Medicare/[facility]    Authorization Time Period  07-26-17 - 09-24-17    PT Start Time  1320    PT Stop Time  1400    PT Time Calculation (min)  40 min       Past Medical History:  Diagnosis Date  . Acute blood loss anemia   . Adjustment disorder with depressed mood   . CVA (cerebral vascular accident) (HCC) 02/03/2016   Linear infarct within the posterior limb of left internal capsule  . Diastolic CHF (HCC)   . Dysphagia   . Dysphonia 02/20/2013  . Essential and other specified forms of tremor 02/20/2013  . HLD (hyperlipidemia)   . HTN (hypertension)   . OAB (overactive bladder)   . Osteoporosis   . Patient receiving subcutaneous heparin    For DVT prophylaxis 9/17  . Prediabetes   . Psoriasis   . Slow transit constipation   . Stroke (cerebrum) (HCC) 02/01/2016  . Trigeminal neuralgia 02/20/2013    Past Surgical History:  Procedure Laterality Date  . APPENDECTOMY  1940  . bladder tack    . CATARACT EXTRACTION Bilateral   . HEMORRHOID SURGERY  1960  . HUMERUS FRACTURE SURGERY    . LAPAROSCOPIC HYSTERECTOMY  2006  . torn rotator cuff    . WRIST FRACTURE SURGERY  2005   seconday shoulder 2001    There were no vitals filed for this visit.  Subjective Assessment - 08/03/17 0849    Subjective  Pt states her right hand started turning on the walker a few days ago; went to [facility] for AFO consult for RLE (appt was last Tuesday, 07-27-17)    Patient is accompained by:  Family member    Pertinent History  Rt MCA CVA  on 02-03-16:  Cryptogenic CVA on 04-05-17; pt received OP PT at this facility from 06-01-16 - 11-19-16    Patient Stated Goals  "I would like to walk in the park"     Currently in Pain?  No/denies                      [facility] Adult PT Treatment/Exercise - 08/03/17 0001      Transfers   Transfers  Sit to Stand    Number of Reps  Other reps (comment) 5 reps    Comments  pt used LUE support - cues to not brace against mat table      Ambulation/Gait   Ambulation/Gait  Yes    Ambulation/Gait Assistance  4: Min guard    Ambulation Distance (Feet)  150 Feet    Assistive device  Rolling walker hand orthosis on Rt side     Gait Pattern  Decreased hip/knee flexion - right;Decreased dorsiflexion - right;Decreased weight shift to right;Decreased step length - right    Ambulation Surface  Level;Indoor       Passive stretching of Rt heel cord - pt in seated position with knee extended - 30 sec hold x 2 reps  NeuroRe-ed:  Pt performed tap ups to 6" step with LLE with manual blocking of Rt knee to prevent hyperextension- 2 sets of 10 reps  Step ups onto step with UE support 5 reps with mod assist to prevent Rt knee hyperextension  Pt performed standing balance exercise - forward/backward stepping with LLE with mod assist to increase weight shift onto RLE and manual assistance to prevent Rt knee hyperextension - 5 reps stepping forward/back and laterally with LUE support on RW        PT Short Term Goals - 07/26/17 2130      PT SHORT TERM GOAL #1   Title  Pt will perform sit to stand 5 reps from mat without UE support to demo improved LE strength.    Baseline  UE support needed    Time  4    Period  Weeks    Status  New    Target Date  08/25/17      PT SHORT TERM GOAL #2   Title  Pt will receive consult for custom AFO for RLE.    Baseline  scheduled at [facility] on 07-27-17    Time  4    Period  Weeks    Status  New    Target Date  08/25/17      PT SHORT TERM GOAL #3    Title  Pt will stand for at least 2" without UE support with SBA to increase independence with ADL's.    Baseline   UE support needed    Time  4    Period  Weeks    Status  New    Target Date  08/25/17      PT SHORT TERM GOAL #4   Title  Incr. gait velocity to >/= .9 ft/sec with RW for incr. gait efficiency.    Baseline  .54 ft/sec with RW (no AFO used)    Time  4    Period  Weeks    Status  New    Target Date  08/25/17      PT SHORT TERM GOAL #5   Title  Pt will amb. with RW 350' with SBA on flat, even surface.    Baseline  42' with RW    Time  4    Period  Weeks    Status  New    Target Date  08/25/17        PT Long Term Goals - 07/26/17 2142      PT LONG TERM GOAL #1   Title  Pt will increase gait velocity from .54 ft/sec to >/= 1.2 ft/sec with RW with AFO on RLE for incr. gait efficiency.    Baseline  .54 ft/sec with RW    Time  8    Period  Weeks    Status  New    Target Date  09/24/17      PT LONG TERM GOAL #2   Title  Pt will stand for at least 10" with UE support prn and reach 6" outside BOS without LOB for incr. independence and safety with ADL's.    Time  8    Period  Weeks    Status  New    Target Date  09/24/17      PT LONG TERM GOAL #3   Title  Pt will negotiate 4 steps with Lt hand rail with CGA using a step by step sequence.    Time  8    Period  Weeks  Status  New    Target Date  09/24/17      PT LONG TERM GOAL #4   Title  Independent in HEP for RLE strengthening and balance exercises.    Time  8    Period  Weeks    Status  New    Target Date  09/24/17      PT LONG TERM GOAL #5   Title  Pt will amb. 500' outside with RW with AFO on RLE with SBA to allow pt to return walking in the park per her stated goal.    Time  8    Period  Weeks    Status  New    Target Date  09/24/17            Plan - 08/03/17 0855    Clinical Impression Statement  Pt has Rt genu recurvatum in stance with significant postural deviations including forward  head and trunk flexion with pt unable to stand erect;  pt would benefit from custom AFO for RLE to prevent foot drop and knee hyperextension; discussed with OT need for modification on handle of RW on Rt side due to increased spasticity iwth Rt forearm and hand supinating while holding onto RW handle    Rehab Potential  Good    Clinical Impairments Affecting Rehab Potential  severity of deficits with h/o prior CVA with Rt hemiparesis    PT Frequency  2x / week    PT Duration  8 weeks    PT Treatment/Interventions  ADLs/Self Care Home Management;Stair training;Gait training;DME Instruction;Therapeutic activities;Therapeutic exercise;Balance training;Neuromuscular re-education;Patient/family education;Orthotic Fit/Training;Passive range of motion    PT Next Visit Plan  HEP for RLE strengthening - sit to stand, bridges, SLR, hip abduction in hooklying: standing balance as appropriate    PT Home Exercise Plan  see above    Recommended Other Services  called [facility] again on 08-03-17 to discuss AFO for RLE    Consulted and Agree with Plan of Care  Patient;Family member/caregiver    Family Member Consulted  husband        Patient will benefit from skilled therapeutic intervention in order to improve the following deficits and impairments:  Abnormal gait, Decreased endurance, Decreased activity tolerance, Decreased balance, Decreased coordination, Decreased range of motion, Decreased strength, Impaired tone, Impaired UE functional use, Postural dysfunction  Visit Diagnosis: Muscle weakness (generalized)  Unsteadiness on feet  Other abnormalities of gait and mobility     Problem List Patient Active Problem List   Diagnosis Date Noted  . Acute lower UTI   . Labile blood pressure   . Abnormal urinalysis   . Trauma   . Closed compression fracture of L4 lumbar vertebra (HCC)   . Neurogenic bladder   . History of hypertension   . Anemia of chronic disease   . Hypoalbuminemia due to  protein-calorie malnutrition (HCC)   . Acute bilateral cerebral infarction in a watershed distribution 04/05/2017  . Benign essential HTN   . Chronic diastolic heart failure (HCC)   . Right spastic hemiparesis (HCC)   . History of stroke 02/16/2017  . Abnormality of gait following cerebrovascular accident (CVA) 02/16/2017  . Slow transit constipation   . Acute blood loss anemia   . Adjustment disorder with depressed mood   . Cerebrovascular accident (CVA) (HCC) 02/03/2016  . Dysarthria, post-stroke   . Dysphagia, post-stroke   . Leukocytosis   . Prediabetes   . Right hemiparesis (HCC)   . Cerebral infarction due to unspecified  mechanism   . Other secondary hypertension   . Overactive bladder   . Diastolic dysfunction   . Hyperlipidemia   . Essential hypertension   . Essential tremor   . Stroke (cerebrum) (HCC) 02/01/2016  . Facial droop due to stroke 02/01/2016  . Essential and other specified forms of tremor 02/20/2013  . Dysphonia 02/20/2013  . Trigeminal neuralgia 02/20/2013    Kary Kos, PT 08/03/2017, 9:05 AM  [facility] [address] [place], [place], [address] Phone: [phone]   Fax:  [phone]  Name: NAJAE FILSAIME MRN: [id number] Date of Birth: 09/13/1927

## 2017-08-05 ENCOUNTER — Encounter: Payer: Self-pay | Admitting: Occupational Therapy

## 2017-08-05 ENCOUNTER — Ambulatory Visit: Payer: Medicare Other | Admitting: Physical Therapy

## 2017-08-05 ENCOUNTER — Ambulatory Visit: Payer: Medicare Other | Admitting: Occupational Therapy

## 2017-08-05 DIAGNOSIS — R278 Other lack of coordination: Secondary | ICD-10-CM | POA: Diagnosis not present

## 2017-08-05 DIAGNOSIS — I69315 Cognitive social or emotional deficit following cerebral infarction: Secondary | ICD-10-CM

## 2017-08-05 DIAGNOSIS — M6281 Muscle weakness (generalized): Secondary | ICD-10-CM

## 2017-08-05 DIAGNOSIS — M25611 Stiffness of right shoulder, not elsewhere classified: Secondary | ICD-10-CM

## 2017-08-05 DIAGNOSIS — I69351 Hemiplegia and hemiparesis following cerebral infarction affecting right dominant side: Secondary | ICD-10-CM | POA: Diagnosis not present

## 2017-08-05 DIAGNOSIS — R2681 Unsteadiness on feet: Secondary | ICD-10-CM

## 2017-08-05 DIAGNOSIS — R2689 Other abnormalities of gait and mobility: Secondary | ICD-10-CM | POA: Diagnosis not present

## 2017-08-05 DIAGNOSIS — R293 Abnormal posture: Secondary | ICD-10-CM

## 2017-08-05 DIAGNOSIS — R29818 Other symptoms and signs involving the nervous system: Secondary | ICD-10-CM | POA: Diagnosis not present

## 2017-08-05 DIAGNOSIS — M25641 Stiffness of right hand, not elsewhere classified: Secondary | ICD-10-CM

## 2017-08-05 NOTE — Patient Instructions (Addendum)
Bridging    Slowly raise buttocks from floor, keeping stomach tight. Repeat ____ times per set. Do ____ sets per session. Do ____ sessions per day.  http://orth.exer.us/1097   Copyright  VHI. All rights reserved.  Hip Abduction / Adduction: with Knee Flexion (Supine)    With right knee bent, gently lower knee to side and return. Repeat _10_ times per set. Do ____ sets per session. Do ____ sessions per day.  http://orth.exer.us/683   Copyright  VHI. All rights reserved.

## 2017-08-05 NOTE — Therapy (Signed)
[facility] [address] [place], [place], [address] Phone: [phone]   Fax:  [phone]  Occupational Therapy Treatment  Patient Details  Name: Kelly [name] MRN: [id number] Date of Birth: 1928/04/23 Referring Provider: Dr Pearlean Brownie   Encounter Date: 08/05/2017  OT End of Session - 08/05/17 1721    Visit Number  4    Number of Visits  17    Date for OT Re-Evaluation  09/24/17    Authorization Type  medicare    Authorization Time Period  60 days    OT Start Time  1535    OT Stop Time  1615    OT Time Calculation (min)  40 min    Activity Tolerance  Patient tolerated treatment well    Behavior During Therapy  [facility] for tasks assessed/performed       Past Medical History:  Diagnosis Date  . Acute blood loss anemia   . Adjustment disorder with depressed mood   . CVA (cerebral vascular accident) (HCC) 02/03/2016   Linear infarct within the posterior limb of left internal capsule  . Diastolic CHF (HCC)   . Dysphagia   . Dysphonia 02/20/2013  . Essential and other specified forms of tremor 02/20/2013  . HLD (hyperlipidemia)   . HTN (hypertension)   . OAB (overactive bladder)   . Osteoporosis   . Patient receiving subcutaneous heparin    For DVT prophylaxis 9/17  . Prediabetes   . Psoriasis   . Slow transit constipation   . Stroke (cerebrum) (HCC) 02/01/2016  . Trigeminal neuralgia 02/20/2013    Past Surgical History:  Procedure Laterality Date  . APPENDECTOMY  1940  . bladder tack    . CATARACT EXTRACTION Bilateral   . HEMORRHOID SURGERY  1960  . HUMERUS FRACTURE SURGERY    . LAPAROSCOPIC HYSTERECTOMY  2006  . torn rotator cuff    . WRIST FRACTURE SURGERY  2005   seconday shoulder 2001    There were no vitals filed for this visit.  Subjective Assessment - 08/05/17 1712    Subjective   My hand got tangled in that strap, so we took it off.      Patient is accompained by:  Family member    Pertinent  History  L MCA 01/2016, Multiple bilateral CVA's 03/2017, HTN, CHF    Currently in Pain?  Yes    Pain Score  3     Pain Location  Knee    Pain Orientation  Right    Pain Descriptors / Indicators  Discomfort;Sore    Pain Type  Acute pain    Pain Onset  Today    Pain Frequency  Intermittent    Aggravating Factors   extended walking    Pain Relieving Factors  sitting                   OT Treatments/Exercises (OP) - 08/05/17 0001      ADLs   Functional Mobility  Attempted walking with platform walker to see if right arm could stabilize over a post support.  Her hand looked better on platform, but the added weight and bulk made walking more challenging.  Non slip grip on custom splint seemed sufficient in clinic as patient will not wear strap on hand.  Initiated fabrication of short wrsit splint that may be helpful in reducing wrist flexion and ulnar deviation which allows wrist to roll off walker.  Patient needs AFO to help with alignment of foot  and ankle, and hopefully to aide with knee hyperextension to prevent undue stress on UE's during gait cycle.        Splinting   Splinting  Patient reporting getting tangled in strap for walker splint in bathroom at home, and husband removed the strap.  Patient's hand rolled off custom walker splint, and she twisted her knee walking to car.  Added non slip grip to walker splint and in clinic patient felt this helped, although most solutions initially are well liked by patient.  Unable to complete resting hand splint this session due to further problem solving relating to safe use of walker.               OT Education - 08/05/17 1720    Education provided  Yes    Education Details  Problem solving with patient and husband and PT regarding best option for UE support for walking    Person(s) Educated  Spouse    Methods  Demonstration    Comprehension  Need further instruction       OT Short Term Goals - 08/02/17 1647      OT SHORT  TERM GOAL #1   Title  Patient will complete a home exercise program designed to improve range of motion in right arm- with min prompting.  Due 08/25/17    Status  On-going      OT SHORT TERM GOAL #2   Title  Patient will complete a home activity program designed to increase functional use of right UE with min prompting    Status  On-going      OT SHORT TERM GOAL #3   Title  Patient will stand at counter and remove 2 lightweight items (silverware, lightweight saucer)  from lower rack of dishwasher with close supervision    Status  On-going      OT SHORT TERM GOAL #4   Title  Patient will reach forward to to obtain a lightweight item (shirt) from lower surface while seated demonstrating 45 degrees of shoulder flexion with sufficient hand opening and grasp.      Status  On-going      OT SHORT TERM GOAL #5   Title  Pt will be able to cut food using bilateral upper extremities and min assist    Status  On-going      OT SHORT TERM GOAL #6   Title  Patient will bring finger food to mouth once placed in right hand    Time  4    Period  Weeks    Status  New        OT Long Term Goals - 08/02/17 1647      OT LONG TERM GOAL #1   Title  Patient will complete an HEP designed to improve RUE AROM/PROM with mod I due 5/3    Status  On-going      OT LONG TERM GOAL #2   Title  Patient will independently complete a home activity program designed to improve right UE functional ability     Status  On-going      OT LONG TERM GOAL #3   Title  Patient will legibly sign her name with RUE and write a short grocery list    Status  On-going      OT LONG TERM GOAL #4   Title  Patient will load 2-3 lightweight items into dishwasher after each meal- using right upper extremity    Status  On-going  OT LONG TERM GOAL #5   Title  Patient will use RUE to wipe down countertops in kitchen after meals to assist with clean up     Status  On-going      OT LONG TERM GOAL #6   Title  Patient will wash  lightweight dishes after meals using BUE's    Status  On-going      OT LONG TERM GOAL #7   Title  Patient will demonstrate ability to use right arm to open cupboard doors and drawers in her home    Status  On-going            Plan - 08/05/17 1721    Clinical Impression Statement  Patient with significant postural control impairment and spasticity in right extremities, which is making best UE support for walking challenging.      Occupational Profile and client history currently impacting functional performance  Wife, mother, fiercely independent prior to CVA's, active in church    Occupational performance deficits (Please refer to evaluation for details):  ADL's;IADL's;Social Participation;Leisure    Rehab Potential  Good    OT Frequency  2x / week    OT Duration  8 weeks    OT Treatment/Interventions  Self-care/ADL training;Moist Heat;Fluidtherapy;DME and/or AE instruction;Splinting;Balance training;Therapeutic activities;Therapeutic exercise;Cognitive remediation/compensation;Neuromuscular education;Functional Mobility Training;Patient/family education;Manual Therapy;Paraffin;Electrical Stimulation    Plan  finish wrist splint for day time use - needs to be compatible with walker    Clinical Decision Making  Several treatment options, min-mod task modification necessary    OT Home Exercise Plan  Patient will bring prior HEP for review    Consulted and Agree with Plan of Care  Patient;Family member/caregiver    Family Member Consulted  husband       Patient will benefit from skilled therapeutic intervention in order to improve the following deficits and impairments:  Decreased coordination, Decreased range of motion, Difficulty walking, Impaired flexibility, Improper body mechanics, Decreased safety awareness, Increased edema, Impaired sensation, Decreased activity tolerance, Impaired tone, Decreased balance, Decreased knowledge of use of DME, Impaired UE functional use, Decreased  cognition, Decreased mobility, Decreased strength, Impaired perceived functional ability  Visit Diagnosis: Spastic hemiplegia of right dominant side as late effect of cerebral infarction (HCC)  Unsteadiness on feet  Abnormal posture  Muscle weakness (generalized)  Stiffness of right hand, not elsewhere classified  Stiffness of right shoulder, not elsewhere classified  Cognitive social or emotional deficit following cerebral infarction    Problem List Patient Active Problem List   Diagnosis Date Noted  . Acute lower UTI   . Labile blood pressure   . Abnormal urinalysis   . Trauma   . Closed compression fracture of L4 lumbar vertebra (HCC)   . Neurogenic bladder   . History of hypertension   . Anemia of chronic disease   . Hypoalbuminemia due to protein-calorie malnutrition (HCC)   . Acute bilateral cerebral infarction in a watershed distribution 04/05/2017  . Benign essential HTN   . Chronic diastolic heart failure (HCC)   . Right spastic hemiparesis (HCC)   . History of stroke 02/16/2017  . Abnormality of gait following cerebrovascular accident (CVA) 02/16/2017  . Slow transit constipation   . Acute blood loss anemia   . Adjustment disorder with depressed mood   . Cerebrovascular accident (CVA) (HCC) 02/03/2016  . Dysarthria, post-stroke   . Dysphagia, post-stroke   . Leukocytosis   . Prediabetes   . Right hemiparesis (HCC)   . Cerebral infarction due to unspecified  mechanism   . Other secondary hypertension   . Overactive bladder   . Diastolic dysfunction   . Hyperlipidemia   . Essential hypertension   . Essential tremor   . Stroke (cerebrum) (HCC) 02/01/2016  . Facial droop due to stroke 02/01/2016  . Essential and other specified forms of tremor 02/20/2013  . Dysphonia 02/20/2013  . Trigeminal neuralgia 02/20/2013    Collier SalinaGellert, Amaia Lavallie M, OTR/L 08/05/2017, 5:24 PM  [facility] [address] [place], [place], [address] Phone: [phone]   Fax:  [phone]  Name: Kelly [name] MRN: [id number] Date of Birth: 08-05-1927

## 2017-08-06 ENCOUNTER — Encounter: Payer: Self-pay | Admitting: Physical Therapy

## 2017-08-06 NOTE — Therapy (Signed)
[facility] [address] [place], [place], [address] Phone: [phone]   Fax:  [phone]  Physical Therapy Treatment  Patient Details  Name: Kelly [name] MRN: [id number] Date of Birth: 1928/04/23 Referring Provider: Dr Pearlean Brownie   Encounter Date: 08/05/2017  PT End of Session - 08/06/17 1455    Visit Number  3    Number of Visits  17    Date for PT Re-Evaluation  09/24/17    Authorization Type  Medicare/[facility]    Authorization Time Period  07-26-17 - 09-24-17    PT Start Time  1450    PT Stop Time  1530    PT Time Calculation (min)  40 min       Past Medical History:  Diagnosis Date  . Acute blood loss anemia   . Adjustment disorder with depressed mood   . CVA (cerebral vascular accident) (HCC) 02/03/2016   Linear infarct within the posterior limb of left internal capsule  . Diastolic CHF (HCC)   . Dysphagia   . Dysphonia 02/20/2013  . Essential and other specified forms of tremor 02/20/2013  . HLD (hyperlipidemia)   . HTN (hypertension)   . OAB (overactive bladder)   . Osteoporosis   . Patient receiving subcutaneous heparin    For DVT prophylaxis 9/17  . Prediabetes   . Psoriasis   . Slow transit constipation   . Stroke (cerebrum) (HCC) 02/01/2016  . Trigeminal neuralgia 02/20/2013    Past Surgical History:  Procedure Laterality Date  . APPENDECTOMY  1940  . bladder tack    . CATARACT EXTRACTION Bilateral   . HEMORRHOID SURGERY  1960  . HUMERUS FRACTURE SURGERY    . LAPAROSCOPIC HYSTERECTOMY  2006  . torn rotator cuff    . WRIST FRACTURE SURGERY  2005   seconday shoulder 2001    There were no vitals filed for this visit.  Subjective Assessment - 08/06/17 1446    Subjective  Pt states she twisted her knee as she was coming down her steps at home to get to car to come to therapy;  pt states her Rt hand turned on the walker and she almost lost her balance and fell, hurt her knee during this  incident; pt reporting mild pain, not severe at this time    Patient is accompained by:  Family member    Pertinent History  Rt MCA CVA on 02-03-16:  Cryptogenic CVA on 04-05-17; pt received OP PT at this facility from 06-01-16 - 11-19-16    Patient Stated Goals  "I would like to walk in the park"     Currently in Pain?  Yes    Pain Score  3     Pain Location  Knee    Pain Orientation  Right    Pain Descriptors / Indicators  Discomfort;Sore    Pain Type  Acute pain    Pain Onset  Today    Pain Frequency  Intermittent    Aggravating Factors   extended walking    Pain Relieving Factors  non weight-bearing                      [facility] Adult PT Treatment/Exercise - 08/06/17 0001      Transfers   Transfers  Sit to Stand    Number of Reps  Other reps (comment) 5    Comments  pt used LUE support - cues to not brace against mat table  Ambulation/Gait   Ambulation/Gait  Yes    Ambulation/Gait Assistance  4: Min guard    Ambulation Distance (Feet)  75 Feet    Assistive device  Rolling walker hand orthosis on Rt side     Gait Pattern  Decreased hip/knee flexion - right;Decreased dorsiflexion - right;Decreased weight shift to right;Decreased step length - right    Ambulation Surface  Level;Indoor    Gait Comments  toe off AFO and Swedish knee cage used on RLE to increase Rt foot clearance and to decr. Rt knee hyperextension in stance      Exercises   Exercises  Lumbar;Knee/Hip;Ankle      Lumbar Exercises: Supine   Clam  10 reps RLE - manual resistance for abdct/adduction    Bent Knee Raise  10 reps RLE    Single Leg Bridge  10 reps;Non-compliant;2 seconds RLE    Other Supine Lumbar Exercises  Rt knee extension control exercise off side of mat table 10 reps      Knee/Hip Exercises: Seated   Long Arc Quad  AROM;Right;10 reps;1 set      Ankle Exercises: Stretches   Gastroc Stretch  2 reps;30 seconds RLE - pt in seated position             PT Education - 08/06/17  1454    Education provided  Yes    Education Details  bridging and hip abdct/adduction in hooklying exercise added to HEP    Person(s) Educated  Patient;Spouse    Methods  Explanation;Demonstration;Handout    Comprehension  Verbalized understanding;Returned demonstration       PT Short Term Goals - 07/26/17 2130      PT SHORT TERM GOAL #1   Title  Pt will perform sit to stand 5 reps from mat without UE support to demo improved LE strength.    Baseline  UE support needed    Time  4    Period  Weeks    Status  New    Target Date  08/25/17      PT SHORT TERM GOAL #2   Title  Pt will receive consult for custom AFO for RLE.    Baseline  scheduled at [facility] on 07-27-17    Time  4    Period  Weeks    Status  New    Target Date  08/25/17      PT SHORT TERM GOAL #3   Title  Pt will stand for at least 2" without UE support with SBA to increase independence with ADL's.    Baseline   UE support needed    Time  4    Period  Weeks    Status  New    Target Date  08/25/17      PT SHORT TERM GOAL #4   Title  Incr. gait velocity to >/= .9 ft/sec with RW for incr. gait efficiency.    Baseline  .54 ft/sec with RW (no AFO used)    Time  4    Period  Weeks    Status  New    Target Date  08/25/17      PT SHORT TERM GOAL #5   Title  Pt will amb. with RW 350' with SBA on flat, even surface.    Baseline  3975' with RW    Time  4    Period  Weeks    Status  New    Target Date  08/25/17  PT Long Term Goals - 07/26/17 2142      PT LONG TERM GOAL #1   Title  Pt will increase gait velocity from .54 ft/sec to >/= 1.2 ft/sec with RW with AFO on RLE for incr. gait efficiency.    Baseline  .54 ft/sec with RW    Time  8    Period  Weeks    Status  New    Target Date  09/24/17      PT LONG TERM GOAL #2   Title  Pt will stand for at least 10" with UE support prn and reach 6" outside BOS without LOB for incr. independence and safety with ADL's.    Time  8    Period  Weeks    Status   New    Target Date  09/24/17      PT LONG TERM GOAL #3   Title  Pt will negotiate 4 steps with Lt hand rail with CGA using a step by step sequence.    Time  8    Period  Weeks    Status  New    Target Date  09/24/17      PT LONG TERM GOAL #4   Title  Independent in HEP for RLE strengthening and balance exercises.    Time  8    Period  Weeks    Status  New    Target Date  09/24/17      PT LONG TERM GOAL #5   Title  Pt will amb. 500' outside with RW with AFO on RLE with SBA to allow pt to return walking in the park per her stated goal.    Time  8    Period  Weeks    Status  New    Target Date  09/24/17            Plan - 08/06/17 1456    Clinical Impression Statement  Pt has difficulty maintaining Rt hand grip on Rt handle of RW, with Rt hand supinating due to increased tone. This results in unsteadiness/LOB as pt is unable to use RW effectively and therefore, has decreased stability with ambulation.  Informed OT, Bretta Bang, of problem with pt now stating she is unable to use the strap on the hand orthosis, which would maintain Rt hand in neutral position.  Will try to problem solve for appropriate modification to maintain Rt grip on RW that pt could tolerate and use at home.  Pt reported Rt knee did not hurt during gait training with use of AFO and Swedish knee cage, however, pt continued to have Rt knee hyperextension with these orthoses (reduced but not eliminated).  Rehab Potential  Good    Clinical Impairments Affecting Rehab Potential  severity of deficits with h/o prior CVA with Rt hemiparesis    PT Frequency  2x / week    PT Duration  8 weeks    PT Treatment/Interventions  ADLs/Self Care Home Management;Stair training;Gait training;DME Instruction;Therapeutic activities;Therapeutic exercise;Balance training;Neuromuscular  re-education;Patient/family education;Orthotic Fit/Training;Passive range of motion    PT Next Visit Plan  HEP for RLE strengthening - sit to stand, SLR, standing balance as appropriate    PT Home Exercise Plan  see above    Recommended Other Services  spoke with Reita Cliche at [employer] on 08-04-17 - plan is for pt to receive custom plastic AFO with articulating ankle - to control knee hyperextension as well as to prevent foot drop    Consulted and Agree with Plan of Care  Patient;Family member/caregiver    Family Member Consulted  husband        Patient will benefit from skilled therapeutic intervention in order to improve the following deficits and impairments:  Abnormal gait, Decreased endurance, Decreased activity tolerance, Decreased balance, Decreased coordination, Decreased range of motion, Decreased strength, Impaired tone, Impaired UE functional use, Postural dysfunction  Visit Diagnosis: Other abnormalities of gait and mobility  Muscle weakness (generalized)     Problem List Patient Active Problem List   Diagnosis Date Noted  . Acute lower UTI   . Labile blood pressure   . Abnormal urinalysis   . Trauma   . Closed compression fracture of L4 lumbar vertebra (HCC)   . Neurogenic bladder   . History of hypertension   . Anemia of chronic disease   . Hypoalbuminemia due to protein-calorie malnutrition (HCC)   . Acute bilateral cerebral infarction in a watershed distribution 04/05/2017  . Benign essential HTN   . Chronic diastolic heart failure (HCC)   . Right spastic hemiparesis (HCC)   . History of stroke 02/16/2017  . Abnormality of gait following cerebrovascular accident (CVA) 02/16/2017  . Slow transit constipation   . Acute blood loss anemia   . Adjustment disorder with depressed mood   . Cerebrovascular accident (CVA) (HCC) 02/03/2016  . Dysarthria, post-stroke   . Dysphagia, post-stroke   . Leukocytosis   . Prediabetes   . Right hemiparesis (HCC)   . Cerebral  infarction due to unspecified mechanism   . Other secondary hypertension   . Overactive bladder   . Diastolic dysfunction   . Hyperlipidemia   . Essential hypertension   . Essential tremor   . Stroke (cerebrum) (HCC) 02/01/2016  . Facial droop due to stroke 02/01/2016  . Essential and other specified forms of tremor 02/20/2013  . Dysphonia 02/20/2013  . Trigeminal neuralgia 02/20/2013    Kary Kos, PT 08/06/2017, 3:04 PM  [facility] [address] [place], [place], [address] Phone: [phone]   Fax:  [phone]  Name: JERRELL HART MRN: [id number] Date of Birth: [birth year]

## 2017-08-09 ENCOUNTER — Telehealth: Payer: Self-pay

## 2017-08-09 NOTE — Telephone Encounter (Signed)
Rn call Judeth CornfieldStephanie at [phone]. Rn stated thept was seen 07/05/2017 for a stroke and right leg weakness. The order for a right AFO brace was sent 07/12/2017,and requested by home health PT. The pt was being transition to outpatient therapy. Rn explain the notes from Dr. Pearlean BrownieSethi does not mention a brace. The request for the brace was made after Dr.SEthi saw pt 8 days later.PT has right leg weakness from the stroke, and outpatient PT,and OT is recommended. Rn advised Judeth CornfieldStephanie she can request notes from outpatient therapy to be more sufficient so insurance may approve with more documentation. Judeth CornfieldStephanie requested that I still fax Dr. Pearlean BrownieSEthi note.Office notes with AFO brace order sign by Dr. Pearlean BrownieSethi twice to [phone],and confirmed.

## 2017-08-10 ENCOUNTER — Ambulatory Visit: Payer: Medicare Other | Admitting: Physical Therapy

## 2017-08-10 ENCOUNTER — Encounter: Payer: Self-pay | Admitting: Occupational Therapy

## 2017-08-10 ENCOUNTER — Ambulatory Visit: Payer: Medicare Other | Admitting: Occupational Therapy

## 2017-08-10 DIAGNOSIS — M25641 Stiffness of right hand, not elsewhere classified: Secondary | ICD-10-CM

## 2017-08-10 DIAGNOSIS — R278 Other lack of coordination: Secondary | ICD-10-CM | POA: Diagnosis not present

## 2017-08-10 DIAGNOSIS — I69315 Cognitive social or emotional deficit following cerebral infarction: Secondary | ICD-10-CM

## 2017-08-10 DIAGNOSIS — R2689 Other abnormalities of gait and mobility: Secondary | ICD-10-CM

## 2017-08-10 DIAGNOSIS — R293 Abnormal posture: Secondary | ICD-10-CM

## 2017-08-10 DIAGNOSIS — I69351 Hemiplegia and hemiparesis following cerebral infarction affecting right dominant side: Secondary | ICD-10-CM

## 2017-08-10 DIAGNOSIS — M25611 Stiffness of right shoulder, not elsewhere classified: Secondary | ICD-10-CM

## 2017-08-10 DIAGNOSIS — R2681 Unsteadiness on feet: Secondary | ICD-10-CM

## 2017-08-10 DIAGNOSIS — M6281 Muscle weakness (generalized): Secondary | ICD-10-CM

## 2017-08-10 DIAGNOSIS — R29818 Other symptoms and signs involving the nervous system: Secondary | ICD-10-CM | POA: Diagnosis not present

## 2017-08-10 NOTE — Therapy (Signed)
[facility] [address] [place], [place], [address] Phone: [phone]   Fax:  [phone]  Occupational Therapy Treatment  Patient Details  Name: Kelly [name] MRN: [id number] Date of Birth: 1928/05/04 Referring Provider: Dr Pearlean Brownie   Encounter Date: 08/10/2017  OT End of Session - 08/10/17 1647    Visit Number  5    Number of Visits  17    Date for OT Re-Evaluation  09/24/17    Authorization Type  medicare    Authorization Time Period  60 days    OT Start Time  1449    OT Stop Time  1532    OT Time Calculation (min)  43 min    Activity Tolerance  Patient tolerated treatment well       Past Medical History:  Diagnosis Date  . Acute blood loss anemia   . Adjustment disorder with depressed mood   . CVA (cerebral vascular accident) (HCC) 02/03/2016   Linear infarct within the posterior limb of left internal capsule  . Diastolic CHF (HCC)   . Dysphagia   . Dysphonia 02/20/2013  . Essential and other specified forms of tremor 02/20/2013  . HLD (hyperlipidemia)   . HTN (hypertension)   . OAB (overactive bladder)   . Osteoporosis   . Patient receiving subcutaneous heparin    For DVT prophylaxis 9/17  . Prediabetes   . Psoriasis   . Slow transit constipation   . Stroke (cerebrum) (HCC) 02/01/2016  . Trigeminal neuralgia 02/20/2013    Past Surgical History:  Procedure Laterality Date  . APPENDECTOMY  1940  . bladder tack    . CATARACT EXTRACTION Bilateral   . HEMORRHOID SURGERY  1960  . HUMERUS FRACTURE SURGERY    . LAPAROSCOPIC HYSTERECTOMY  2006  . torn rotator cuff    . WRIST FRACTURE SURGERY  2005   seconday shoulder 2001    There were no vitals filed for this visit.  Subjective Assessment - 08/10/17 1640    Subjective   I am not sure what happened I got all tangled up with strap on the walker splint    Patient is accompained by:  Family member husband    Pertinent History  L MCA 01/2016,  Multiple bilateral CVA's 03/2017, HTN, CHF    Currently in Pain?  No/denies                   OT Treatments/Exercises (OP) - 08/10/17 0001      Splinting   Splinting  Fabricated resting hand splint to address decreasing tone, prevention of contracture of R hand, decreasing stiffness and decreasing discomfort with ROM.  Will complete next session and issue. Pt to wear for 2 hours in the afternoon and then when she first goes to bed - pt does not like to put splint on and off during the night when she goes to the bathroom. Pt will don at bedtime and then remove when she gets up to go to the bathroom for total wearing of 5 hours per day. Pt and husband both in agreement and verbalizing understanding.  Also refitted custom hand splint for walker with single strap and pt practiced using several times. Pt to use strap only during the day for now until she is used to strap and then will also incoproate at night as able.              OT Education - 08/10/17 1644    Education provided  Yes    Education Details  resting hand splint and walking hand splint    Person(s) Educated  Patient;Spouse    Methods  Explanation;Demonstration    Comprehension  Verbalized understanding;Need further instruction       OT Short Term Goals - 08/10/17 1645      OT SHORT TERM GOAL #1   Title  Patient will complete a home exercise program designed to improve range of motion in right arm- with min prompting.  Due 08/25/17    Status  On-going      OT SHORT TERM GOAL #2   Title  Patient will complete a home activity program designed to increase functional use of right UE with min prompting    Status  On-going      OT SHORT TERM GOAL #3   Title  Patient will stand at counter and remove 2 lightweight items (silverware, lightweight saucer)  from lower rack of dishwasher with close supervision    Status  On-going      OT SHORT TERM GOAL #4   Title  Patient will reach forward to to obtain a lightweight  item (shirt) from lower surface while seated demonstrating 45 degrees of shoulder flexion with sufficient hand opening and grasp.      Status  On-going      OT SHORT TERM GOAL #5   Title  Pt will be able to cut food using bilateral upper extremities and min assist    Status  On-going      OT SHORT TERM GOAL #6   Title  Patient will bring finger food to mouth once placed in right hand    Time  4    Period  Weeks    Status  On-going        OT Long Term Goals - 08/10/17 1645      OT LONG TERM GOAL #1   Title  Patient will complete an HEP designed to improve RUE AROM/PROM with mod I due 5/3    Status  On-going      OT LONG TERM GOAL #2   Title  Patient will independently complete a home activity program designed to improve right UE functional ability     Status  On-going      OT LONG TERM GOAL #3   Title  Patient will legibly sign her name with RUE and write a short grocery list    Status  On-going      OT LONG TERM GOAL #4   Title  Patient will load 2-3 lightweight items into dishwasher after each meal- using right upper extremity    Status  On-going      OT LONG TERM GOAL #5   Title  Patient will use RUE to wipe down countertops in kitchen after meals to assist with clean up     Status  On-going      OT LONG TERM GOAL #6   Title  Patient will wash lightweight dishes after meals using BUE's    Status  On-going      OT LONG TERM GOAL #7   Title  Patient will demonstrate ability to use right arm to open cupboard doors and drawers in her home    Status  On-going            Plan - 08/10/17 1645    Clinical Impression Statement  Pt with slow progress toward goals. Pt fitted for custom resting hand splint today    Occupational Profile and  client history currently impacting functional performance  Wife, mother, fiercely independent prior to CVA's, active in church    Occupational performance deficits (Please refer to evaluation for details):  ADL's;IADL's;Social  Participation;Leisure    Rehab Potential  Good    OT Frequency  2x / week    OT Duration  8 weeks    OT Treatment/Interventions  Self-care/ADL training;Moist Heat;Fluidtherapy;DME and/or AE instruction;Splinting;Balance training;Therapeutic activities;Therapeutic exercise;Cognitive remediation/compensation;Neuromuscular education;Functional Mobility Training;Patient/family education;Manual Therapy;Paraffin;Electrical Stimulation    Plan  assess strap for walker splint, issue resting hand splint with wear and care instructions,NMR for balance/postural control, RUE    Consulted and Agree with Plan of Care  Patient;Family member/caregiver    Family Member Consulted  husband       Patient will benefit from skilled therapeutic intervention in order to improve the following deficits and impairments:  Decreased coordination, Decreased range of motion, Difficulty walking, Impaired flexibility, Improper body mechanics, Decreased safety awareness, Increased edema, Impaired sensation, Decreased activity tolerance, Impaired tone, Decreased balance, Decreased knowledge of use of DME, Impaired UE functional use, Decreased cognition, Decreased mobility, Decreased strength, Impaired perceived functional ability  Visit Diagnosis: Spastic hemiplegia of right dominant side as late effect of cerebral infarction (HCC)  Unsteadiness on feet  Abnormal posture  Muscle weakness (generalized)  Stiffness of right hand, not elsewhere classified  Stiffness of right shoulder, not elsewhere classified  Cognitive social or emotional deficit following cerebral infarction  Other abnormalities of gait and mobility    Problem List Patient Active Problem List   Diagnosis Date Noted  . Acute lower UTI   . Labile blood pressure   . Abnormal urinalysis   . Trauma   . Closed compression fracture of L4 lumbar vertebra (HCC)   . Neurogenic bladder   . History of hypertension   . Anemia of chronic disease   .  Hypoalbuminemia due to protein-calorie malnutrition (HCC)   . Acute bilateral cerebral infarction in a watershed distribution 04/05/2017  . Benign essential HTN   . Chronic diastolic heart failure (HCC)   . Right spastic hemiparesis (HCC)   . History of stroke 02/16/2017  . Abnormality of gait following cerebrovascular accident (CVA) 02/16/2017  . Slow transit constipation   . Acute blood loss anemia   . Adjustment disorder with depressed mood   . Cerebrovascular accident (CVA) (HCC) 02/03/2016  . Dysarthria, post-stroke   . Dysphagia, post-stroke   . Leukocytosis   . Prediabetes   . Right hemiparesis (HCC)   . Cerebral infarction due to unspecified mechanism   . Other secondary hypertension   . Overactive bladder   . Diastolic dysfunction   . Hyperlipidemia   . Essential hypertension   . Essential tremor   . Stroke (cerebrum) (HCC) 02/01/2016  . Facial droop due to stroke 02/01/2016  . Essential and other specified forms of tremor 02/20/2013  . Dysphonia 02/20/2013  . Trigeminal neuralgia 02/20/2013    Mackie Pai [facility] 08/10/2017, 4:49 PM  [facility] [address] [place], [place], [address] Phone: [phone]   Fax:  [phone]  Name: ALVENA KIERNAN MRN: [id number] Date of Birth: 1928/04/23

## 2017-08-11 ENCOUNTER — Ambulatory Visit: Payer: Medicare Other | Admitting: Occupational Therapy

## 2017-08-11 ENCOUNTER — Encounter: Payer: Self-pay | Admitting: Occupational Therapy

## 2017-08-11 ENCOUNTER — Encounter: Payer: Self-pay | Admitting: Physical Therapy

## 2017-08-11 DIAGNOSIS — R278 Other lack of coordination: Secondary | ICD-10-CM | POA: Diagnosis not present

## 2017-08-11 DIAGNOSIS — I69315 Cognitive social or emotional deficit following cerebral infarction: Secondary | ICD-10-CM

## 2017-08-11 DIAGNOSIS — R2689 Other abnormalities of gait and mobility: Secondary | ICD-10-CM | POA: Diagnosis not present

## 2017-08-11 DIAGNOSIS — R2681 Unsteadiness on feet: Secondary | ICD-10-CM

## 2017-08-11 DIAGNOSIS — M6281 Muscle weakness (generalized): Secondary | ICD-10-CM | POA: Diagnosis not present

## 2017-08-11 DIAGNOSIS — I69351 Hemiplegia and hemiparesis following cerebral infarction affecting right dominant side: Secondary | ICD-10-CM

## 2017-08-11 DIAGNOSIS — M25611 Stiffness of right shoulder, not elsewhere classified: Secondary | ICD-10-CM

## 2017-08-11 DIAGNOSIS — R293 Abnormal posture: Secondary | ICD-10-CM

## 2017-08-11 DIAGNOSIS — R29818 Other symptoms and signs involving the nervous system: Secondary | ICD-10-CM | POA: Diagnosis not present

## 2017-08-11 DIAGNOSIS — M25641 Stiffness of right hand, not elsewhere classified: Secondary | ICD-10-CM

## 2017-08-11 NOTE — Therapy (Signed)
[facility] [address] [place], [place], [address] Phone: [phone]   Fax:  [phone]  Occupational Therapy Treatment  Patient Details  Name: Kelly [name] MRN: [id number] Date of Birth: 1928/01/26 Referring Provider: Dr Pearlean Brownie   Encounter Date: 08/11/2017  OT End of Session - 08/11/17 1638    Visit Number  6    Number of Visits  17    Date for OT Re-Evaluation  09/24/17    Authorization Type  medicare    Authorization Time Period  60 days    OT Start Time  1447    OT Stop Time  1530    OT Time Calculation (min)  43 min    Activity Tolerance  Patient tolerated treatment well    Behavior During Therapy  [facility] for tasks assessed/performed       Past Medical History:  Diagnosis Date  . Acute blood loss anemia   . Adjustment disorder with depressed mood   . CVA (cerebral vascular accident) (HCC) 02/03/2016   Linear infarct within the posterior limb of left internal capsule  . Diastolic CHF (HCC)   . Dysphagia   . Dysphonia 02/20/2013  . Essential and other specified forms of tremor 02/20/2013  . HLD (hyperlipidemia)   . HTN (hypertension)   . OAB (overactive bladder)   . Osteoporosis   . Patient receiving subcutaneous heparin    For DVT prophylaxis 9/17  . Prediabetes   . Psoriasis   . Slow transit constipation   . Stroke (cerebrum) (HCC) 02/01/2016  . Trigeminal neuralgia 02/20/2013    Past Surgical History:  Procedure Laterality Date  . APPENDECTOMY  1940  . bladder tack    . CATARACT EXTRACTION Bilateral   . HEMORRHOID SURGERY  1960  . HUMERUS FRACTURE SURGERY    . LAPAROSCOPIC HYSTERECTOMY  2006  . torn rotator cuff    . WRIST FRACTURE SURGERY  2005   seconday shoulder 2001    There were no vitals filed for this visit.  Subjective Assessment - 08/11/17 1629    Subjective   I think its ok    Patient is accompained by:  Family member    Pertinent History  L MCA 01/2016, Multiple bilateral  CVA's 03/2017, HTN, CHF    Currently in Pain?  No/denies    Pain Score  0-No pain                   OT Treatments/Exercises (OP) - 08/11/17 0001      Neurological Re-education Exercises   Other Exercises 1  Worked on postural control and more sustained trunk activation for improved extension and improved pelvic rotation.  Patient able to accept weight through long arm and followed with handwriting - items for grocery list.     Other Exercises 2  Worked on mechanics of sit to stand - patient with inadequate weight shift forward, and to hemiplegic side.  Improved with facilitation to maintain weight in midline, and forward weight shift.        Splinting   Splinting  Finished resting hand splint, and reviewed wearing instructions with patient and husband.               OT Education - 08/11/17 1637    Education provided  Yes    Education Details  splint wearing schedule    Person(s) Educated  Spouse    Methods  Explanation;Demonstration    Comprehension  Verbalized understanding;Need further instruction  OT Short Term Goals - 08/10/17 1645      OT SHORT TERM GOAL #1   Title  Patient will complete a home exercise program designed to improve range of motion in right arm- with min prompting.  Due 08/25/17    Status  On-going      OT SHORT TERM GOAL #2   Title  Patient will complete a home activity program designed to increase functional use of right UE with min prompting    Status  On-going      OT SHORT TERM GOAL #3   Title  Patient will stand at counter and remove 2 lightweight items (silverware, lightweight saucer)  from lower rack of dishwasher with close supervision    Status  On-going      OT SHORT TERM GOAL #4   Title  Patient will reach forward to to obtain a lightweight item (shirt) from lower surface while seated demonstrating 45 degrees of shoulder flexion with sufficient hand opening and grasp.      Status  On-going      OT SHORT TERM GOAL #5    Title  Pt will be able to cut food using bilateral upper extremities and min assist    Status  On-going      OT SHORT TERM GOAL #6   Title  Patient will bring finger food to mouth once placed in right hand    Time  4    Period  Weeks    Status  On-going        OT Long Term Goals - 08/10/17 1645      OT LONG TERM GOAL #1   Title  Patient will complete an HEP designed to improve RUE AROM/PROM with mod I due 5/3    Status  On-going      OT LONG TERM GOAL #2   Title  Patient will independently complete a home activity program designed to improve right UE functional ability     Status  On-going      OT LONG TERM GOAL #3   Title  Patient will legibly sign her name with RUE and write a short grocery list    Status  On-going      OT LONG TERM GOAL #4   Title  Patient will load 2-3 lightweight items into dishwasher after each meal- using right upper extremity    Status  On-going      OT LONG TERM GOAL #5   Title  Patient will use RUE to wipe down countertops in kitchen after meals to assist with clean up     Status  On-going      OT LONG TERM GOAL #6   Title  Patient will wash lightweight dishes after meals using BUE's    Status  On-going      OT LONG TERM GOAL #7   Title  Patient will demonstrate ability to use right arm to open cupboard doors and drawers in her home    Status  On-going            Plan - 08/11/17 1638    Clinical Impression Statement  Pt with slow progress toward goals. Pt fitted for custom resting hand splint today    Occupational Profile and client history currently impacting functional performance  Wife, mother, fiercely independent prior to CVA's, active in church    Occupational performance deficits (Please refer to evaluation for details):  ADL's;IADL's;Social Participation;Leisure    Rehab Potential  Good  OT Frequency  2x / week    OT Duration  8 weeks    OT Treatment/Interventions  Self-care/ADL training;Moist Heat;Fluidtherapy;DME and/or AE  instruction;Splinting;Balance training;Therapeutic activities;Therapeutic exercise;Cognitive remediation/compensation;Neuromuscular education;Functional Mobility Training;Patient/family education;Manual Therapy;Paraffin;Electrical Stimulation    Plan  NMR TRUNK / UE, Review HEP and update - husband indicates they "didn't have much on paper" for exercises    Clinical Decision Making  Several treatment options, min-mod task modification necessary    OT Home Exercise Plan  Patient will bring prior HEP for review    Consulted and Agree with Plan of Care  Patient;Family member/caregiver    Family Member Consulted  husband       Patient will benefit from skilled therapeutic intervention in order to improve the following deficits and impairments:  Decreased coordination, Decreased range of motion, Difficulty walking, Impaired flexibility, Improper body mechanics, Decreased safety awareness, Increased edema, Impaired sensation, Decreased activity tolerance, Impaired tone, Decreased balance, Decreased knowledge of use of DME, Impaired UE functional use, Decreased cognition, Decreased mobility, Decreased strength, Impaired perceived functional ability  Visit Diagnosis: Spastic hemiplegia of right dominant side as late effect of cerebral infarction (HCC)  Unsteadiness on feet  Abnormal posture  Muscle weakness (generalized)  Stiffness of right hand, not elsewhere classified  Stiffness of right shoulder, not elsewhere classified  Cognitive social or emotional deficit following cerebral infarction    Problem List Patient Active Problem List   Diagnosis Date Noted  . Acute lower UTI   . Labile blood pressure   . Abnormal urinalysis   . Trauma   . Closed compression fracture of L4 lumbar vertebra (HCC)   . Neurogenic bladder   . History of hypertension   . Anemia of chronic disease   . Hypoalbuminemia due to protein-calorie malnutrition (HCC)   . Acute bilateral cerebral infarction in a  watershed distribution 04/05/2017  . Benign essential HTN   . Chronic diastolic heart failure (HCC)   . Right spastic hemiparesis (HCC)   . History of stroke 02/16/2017  . Abnormality of gait following cerebrovascular accident (CVA) 02/16/2017  . Slow transit constipation   . Acute blood loss anemia   . Adjustment disorder with depressed mood   . Cerebrovascular accident (CVA) (HCC) 02/03/2016  . Dysarthria, post-stroke   . Dysphagia, post-stroke   . Leukocytosis   . Prediabetes   . Right hemiparesis (HCC)   . Cerebral infarction due to unspecified mechanism   . Other secondary hypertension   . Overactive bladder   . Diastolic dysfunction   . Hyperlipidemia   . Essential hypertension   . Essential tremor   . Stroke (cerebrum) (HCC) 02/01/2016  . Facial droop due to stroke 02/01/2016  . Essential and other specified forms of tremor 02/20/2013  . Dysphonia 02/20/2013  . Trigeminal neuralgia 02/20/2013    Collier SalinaGellert, Kristin M, OTR/L 08/11/2017, 4:41 PM  [facility] [address] [place], [place], [address] Phone: [phone]   Fax:  [phone]  Name: Kelly [name] MRN: [id number] Date of Birth: 04-25-1928

## 2017-08-11 NOTE — Therapy (Signed)
[facility] [address] [place], [place], [address] Phone: [phone]   Fax:  [phone]  Physical Therapy Treatment  Patient Details  Name: Kelly [name] MRN: [id number] Date of Birth: 09/19/1927 Referring Provider: Dr Pearlean BrownieSethi   Encounter Date: 08/10/2017  PT End of Session - 08/11/17 1302    Visit Number  4    Number of Visits  17    Date for PT Re-Evaluation  09/24/17    Authorization Type  Medicare/[facility]    Authorization Time Period  07-26-17 - 09-24-17    PT Start Time  1402    PT Stop Time  1447    PT Time Calculation (min)  45 min    Equipment Utilized During Treatment  Gait belt       Past Medical History:  Diagnosis Date  . Acute blood loss anemia   . Adjustment disorder with depressed mood   . CVA (cerebral vascular accident) (HCC) 02/03/2016   Linear infarct within the posterior limb of left internal capsule  . Diastolic CHF (HCC)   . Dysphagia   . Dysphonia 02/20/2013  . Essential and other specified forms of tremor 02/20/2013  . HLD (hyperlipidemia)   . HTN (hypertension)   . OAB (overactive bladder)   . Osteoporosis   . Patient receiving subcutaneous heparin    For DVT prophylaxis 9/17  . Prediabetes   . Psoriasis   . Slow transit constipation   . Stroke (cerebrum) (HCC) 02/01/2016  . Trigeminal neuralgia 02/20/2013    Past Surgical History:  Procedure Laterality Date  . APPENDECTOMY  1940  . bladder tack    . CATARACT EXTRACTION Bilateral   . HEMORRHOID SURGERY  1960  . HUMERUS FRACTURE SURGERY    . LAPAROSCOPIC HYSTERECTOMY  2006  . torn rotator cuff    . WRIST FRACTURE SURGERY  2005   seconday shoulder 2001    There were no vitals filed for this visit.  Subjective Assessment - 08/11/17 1256    Subjective  Pt amb. with built up grip only on Rt side of RW:  husband states she is doing much better; states sometimes she is able to maintain grip on RW really well, other times is  more difficult     Patient is accompained by:  Family member    Pertinent History  Rt MCA CVA on 02-03-16:  Cryptogenic CVA on 04-05-17; pt received OP PT at this facility from 06-01-16 - 11-19-16    Patient Stated Goals  "I would like to walk in the park"     Currently in Pain?  No/denies                      [facility] Adult PT Treatment/Exercise - 08/11/17 0001      Transfers   Transfers  Sit to Stand    Number of Reps  Other reps (comment) 5    Comments  pt used LUE support - cues to not brace against mat table      Ambulation/Gait   Ambulation/Gait  Yes    Ambulation/Gait Assistance  4: Min guard    Ambulation Distance (Feet)  115 Feet 2 reps    Assistive device  Rolling walker AFO and heel wedge used on RLE    Gait Pattern  Decreased hip/knee flexion - right;Decreased dorsiflexion - right;Decreased weight shift to right;Decreased step length - right    Ambulation Surface  Level;Indoor    Gait Comments  toe off AFO and heel wedge used in Rt shoe      Knee/Hip Exercises: Aerobic   Recumbent Bike  SciFit level 1.5 x 5" with UE's and LE's      NeuroRe-ed:  Pt performed tap ups onto 6" step with LLE with mod assist for balance - for increased weight shift and  Weight bearing onto RLE; moderate manual blocking of Rt knee to prevent hyperextension; 10 reps with LLE Pt performed tap ups with RLE x 5 reps for Rt hip strengthening         PT Short Term Goals - 07/26/17 2130      PT SHORT TERM GOAL #1   Title  Pt will perform sit to stand 5 reps from mat without UE support to demo improved LE strength.    Baseline  UE support needed    Time  4    Period  Weeks    Status  New    Target Date  08/25/17      PT SHORT TERM GOAL #2   Title  Pt will receive consult for custom AFO for RLE.    Baseline  scheduled at [facility] on 07-27-17    Time  4    Period  Weeks    Status  New    Target Date  08/25/17      PT SHORT TERM GOAL #3   Title  Pt will stand for at least 2"  without UE support with SBA to increase independence with ADL's.    Baseline   UE support needed    Time  4    Period  Weeks    Status  New    Target Date  08/25/17      PT SHORT TERM GOAL #4   Title  Incr. gait velocity to >/= .9 ft/sec with RW for incr. gait efficiency.    Baseline  .54 ft/sec with RW (no AFO used)    Time  4    Period  Weeks    Status  New    Target Date  08/25/17      PT SHORT TERM GOAL #5   Title  Pt will amb. with RW 350' with SBA on flat, even surface.    Baseline  41' with RW    Time  4    Period  Weeks    Status  New    Target Date  08/25/17        PT Long Term Goals - 07/26/17 2142      PT LONG TERM GOAL #1   Title  Pt will increase gait velocity from .54 ft/sec to >/= 1.2 ft/sec with RW with AFO on RLE for incr. gait efficiency.    Baseline  .54 ft/sec with RW    Time  8    Period  Weeks    Status  New    Target Date  09/24/17      PT LONG TERM GOAL #2   Title  Pt will stand for at least 10" with UE support prn and reach 6" outside BOS without LOB for incr. independence and safety with ADL's.    Time  8    Period  Weeks    Status  New    Target Date  09/24/17      PT LONG TERM GOAL #3   Title  Pt will negotiate 4 steps with Lt hand rail with CGA using a step by step sequence.    Time  8    Period  Weeks    Status  New    Target Date  09/24/17      PT LONG TERM GOAL #4   Title  Independent in HEP for RLE strengthening and balance exercises.    Time  8    Period  Weeks    Status  New    Target Date  09/24/17      PT LONG TERM GOAL #5   Title  Pt will amb. 500' outside with RW with AFO on RLE with SBA to allow pt to return walking in the park per her stated goal.    Time  8    Period  Weeks    Status  New    Target Date  09/24/17            Plan - 08/11/17 1334    Clinical Impression Statement  Pt improving with balance and gait - AFO and heel wedge signficantly improved gait by reducing Rt knee hyperextension and  increasing Rt foot clearance in swing phase of gait    Rehab Potential  Good    Clinical Impairments Affecting Rehab Potential  severity of deficits with h/o prior CVA with Rt hemiparesis    PT Frequency  2x / week    PT Duration  8 weeks    PT Treatment/Interventions  ADLs/Self Care Home Management;Stair training;Gait training;DME Instruction;Therapeutic activities;Therapeutic exercise;Balance training;Neuromuscular re-education;Patient/family education;Orthotic Fit/Training;Passive range of motion    PT Next Visit Plan  cont gait and balance training    PT Home Exercise Plan  see above    Consulted and Agree with Plan of Care  Patient    Family Member Consulted  husband        Patient will benefit from skilled therapeutic intervention in order to improve the following deficits and impairments:  Abnormal gait, Decreased endurance, Decreased activity tolerance, Decreased balance, Decreased coordination, Decreased range of motion, Decreased strength, Impaired tone, Impaired UE functional use, Postural dysfunction  Visit Diagnosis: Muscle weakness (generalized)  Other abnormalities of gait and mobility  Unsteadiness on feet     Problem List Patient Active Problem List   Diagnosis Date Noted  . Acute lower UTI   . Labile blood pressure   . Abnormal urinalysis   . Trauma   . Closed compression fracture of L4 lumbar vertebra (HCC)   . Neurogenic bladder   . History of hypertension   . Anemia of chronic disease   . Hypoalbuminemia due to protein-calorie malnutrition (HCC)   . Acute bilateral cerebral infarction in a watershed distribution 04/05/2017  . Benign essential HTN   . Chronic diastolic heart failure (HCC)   . Right spastic hemiparesis (HCC)   . History of stroke 02/16/2017  . Abnormality of gait following cerebrovascular accident (CVA) 02/16/2017  . Slow transit constipation   . Acute blood loss anemia   . Adjustment disorder with depressed mood   . Cerebrovascular  accident (CVA) (HCC) 02/03/2016  . Dysarthria, post-stroke   . Dysphagia, post-stroke   . Leukocytosis   . Prediabetes   . Right hemiparesis (HCC)   . Cerebral infarction due to unspecified mechanism   . Other secondary hypertension   . Overactive bladder   . Diastolic dysfunction   . Hyperlipidemia   . Essential hypertension   . Essential tremor   . Stroke (cerebrum) (HCC) 02/01/2016  . Facial droop due to stroke 02/01/2016  . Essential and other specified forms of tremor 02/20/2013  . Dysphonia  02/20/2013  . Trigeminal neuralgia 02/20/2013    Kary Kos, PT 08/11/2017, 1:37 PM  [facility] [address] [place], [place], [address] Phone: [phone]   Fax:  [phone]  Name: Kelly [name] MRN: [id number] Date of Birth: Oct 22, 1927

## 2017-08-11 NOTE — Patient Instructions (Signed)
Hand Splint    Wearing Schedule: Day Night As needed 2-3 hours per session. 2 vsessions per day. Check skin condition after 60  Minutes today for changes in color, irritation or swelling.  If any of these problems occur - discontinue splint and bring in to OT for repair.    Wear 2 hours during the day. Wear 3 hours at bedtime from 9pm until you wake to use the restroom.  Remove splint before trying to walk with your walker.    Copyright  [employer]. All rights reserved.

## 2017-08-12 ENCOUNTER — Encounter: Payer: Self-pay | Admitting: Physical Therapy

## 2017-08-12 ENCOUNTER — Ambulatory Visit: Payer: Medicare Other | Admitting: Physical Therapy

## 2017-08-12 DIAGNOSIS — M6281 Muscle weakness (generalized): Secondary | ICD-10-CM

## 2017-08-12 DIAGNOSIS — R29818 Other symptoms and signs involving the nervous system: Secondary | ICD-10-CM | POA: Diagnosis not present

## 2017-08-12 DIAGNOSIS — R2681 Unsteadiness on feet: Secondary | ICD-10-CM

## 2017-08-12 DIAGNOSIS — R2689 Other abnormalities of gait and mobility: Secondary | ICD-10-CM

## 2017-08-12 DIAGNOSIS — I69351 Hemiplegia and hemiparesis following cerebral infarction affecting right dominant side: Secondary | ICD-10-CM | POA: Diagnosis not present

## 2017-08-12 DIAGNOSIS — R278 Other lack of coordination: Secondary | ICD-10-CM | POA: Diagnosis not present

## 2017-08-13 NOTE — Therapy (Signed)
[facility] [address] [place], [place], [address] Phone: [phone]   Fax:  [phone]  Physical Therapy Treatment  Patient Details  Name: Kelly [name] MRN: [id number] Date of Birth: 04-29-1928 Referring Provider: Dr Pearlean BrownieSethi   Encounter Date: 08/12/2017  PT End of Session - 08/12/17 1408    Visit Number  5    Number of Visits  17    Date for PT Re-Evaluation  09/24/17    Authorization Type  Medicare/[facility]    Authorization Time Period  07-26-17 - 09-24-17    PT Start Time  1402    PT Stop Time  1445    PT Time Calculation (min)  43 min    Equipment Utilized During Treatment  Gait belt AFO and heel wedge     Activity Tolerance  Patient tolerated treatment well    Behavior During Therapy  [facility] for tasks assessed/performed       Past Medical History:  Diagnosis Date  . Acute blood loss anemia   . Adjustment disorder with depressed mood   . CVA (cerebral vascular accident) (HCC) 02/03/2016   Linear infarct within the posterior limb of left internal capsule  . Diastolic CHF (HCC)   . Dysphagia   . Dysphonia 02/20/2013  . Essential and other specified forms of tremor 02/20/2013  . HLD (hyperlipidemia)   . HTN (hypertension)   . OAB (overactive bladder)   . Osteoporosis   . Patient receiving subcutaneous heparin    For DVT prophylaxis 9/17  . Prediabetes   . Psoriasis   . Slow transit constipation   . Stroke (cerebrum) (HCC) 02/01/2016  . Trigeminal neuralgia 02/20/2013    Past Surgical History:  Procedure Laterality Date  . APPENDECTOMY  1940  . bladder tack    . CATARACT EXTRACTION Bilateral   . HEMORRHOID SURGERY  1960  . HUMERUS FRACTURE SURGERY    . LAPAROSCOPIC HYSTERECTOMY  2006  . torn rotator cuff    . WRIST FRACTURE SURGERY  2005   seconday shoulder 2001    There were no vitals filed for this visit.  Subjective Assessment - 08/12/17 1407    Subjective  "Today I feel good, everything was  working". Reports she was able to pick up her sandwhich and chips with her right hand today. Has been wearing the splint from OT with no issues.     Patient is accompained by:  Family member    Pertinent History  Rt MCA CVA on 02-03-16:  Cryptogenic CVA on 04-05-17; pt received OP PT at this facility from 06-01-16 - 11-19-16    Patient Stated Goals  "I would like to walk in the park"     Currently in Pain?  No/denies    Pain Score  0-No pain        [facility] Adult PT Treatment/Exercise - 08/12/17 1409      Transfers   Transfers  Sit to Stand;Stand to Sit    Sit to Stand  4: Min guard    Stand to Sit  4: Min guard      Ambulation/Gait   Ambulation/Gait  Yes    Ambulation/Gait Assistance  4: Min guard    Ambulation/Gait Assistance Details  continued witn use of toe off AFO/heel wedge to right LE. manual cues/assist still needed for recurvatum control with cues to keep knees "soft". cues for foward gaze/upright posture as well.     Ambulation Distance (Feet)  230 Feet x1  Assistive device  Rolling walker built up handle on right, AFO/heel wedge right shoe    Gait Pattern  Decreased hip/knee flexion - right;Decreased dorsiflexion - right;Decreased weight shift to right;Decreased step length - right    Ambulation Surface  Level;Indoor      High Level Balance   High Level Balance Activities  Side stepping;Marching forwards;Marching backwards    High Level Balance Comments  in parallel bars with UE support for 3-4 laps each. min guard to min assist for balance with demo, then verbal/tactile cues on posture, weight shifting and ex form/technique. manual blocking of right knee for stance control/prevent genu recurvatum. this improved with cues to keep "soft knees".       Neuro Re-ed    Neuro Re-ed Details   for strengthenning/balance/coordination: at bottom of steps- single leg stance with contralateral LE performing toe taps up/down bottom 2 steps. cues for hip/knee flexion, weight shifing and manual  assist for knee stability in right stance/assist for right hip/knee flexion. 10 reps each leg. min assist with UE support on rails.                  PT Short Term Goals - 07/26/17 2130      PT SHORT TERM GOAL #1   Title  Pt will perform sit to stand 5 reps from mat without UE support to demo improved LE strength.    Baseline  UE support needed    Time  4    Period  Weeks    Status  New    Target Date  08/25/17      PT SHORT TERM GOAL #2   Title  Pt will receive consult for custom AFO for RLE.    Baseline  scheduled at [employer] on 07-27-17    Time  4    Period  Weeks    Status  New    Target Date  08/25/17      PT SHORT TERM GOAL #3   Title  Pt will stand for at least 2" without UE support with SBA to increase independence with ADL's.    Baseline   UE support needed    Time  4    Period  Weeks    Status  New    Target Date  08/25/17      PT SHORT TERM GOAL #4   Title  Incr. gait velocity to >/= .9 ft/sec with RW for incr. gait efficiency.    Baseline  .54 ft/sec with RW (no AFO used)    Time  4    Period  Weeks    Status  New    Target Date  08/25/17      PT SHORT TERM GOAL #5   Title  Pt will amb. with RW 350' with SBA on flat, even surface.    Baseline  41' with RW    Time  4    Period  Weeks    Status  New    Target Date  08/25/17        PT Long Term Goals - 07/26/17 2142      PT LONG TERM GOAL #1   Title  Pt will increase gait velocity from .54 ft/sec to >/= 1.2 ft/sec with RW with AFO on RLE for incr. gait efficiency.    Baseline  .54 ft/sec with RW    Time  8    Period  Weeks    Status  New    Target Date  09/24/17      PT LONG TERM GOAL #2   Title  Pt will stand for at least 10" with UE support prn and reach 6" outside BOS without LOB for incr. independence and safety with ADL's.    Time  8    Period  Weeks    Status  New    Target Date  09/24/17      PT LONG TERM GOAL #3   Title  Pt will negotiate 4 steps with Lt hand rail with CGA using a  step by step sequence.    Time  8    Period  Weeks    Status  New    Target Date  09/24/17      PT LONG TERM GOAL #4   Title  Independent in HEP for RLE strengthening and balance exercises.    Time  8    Period  Weeks    Status  New    Target Date  09/24/17      PT LONG TERM GOAL #5   Title  Pt will amb. 500' outside with RW with AFO on RLE with SBA to allow pt to return walking in the park per her stated goal.    Time  8    Period  Weeks    Status  New    Target Date  09/24/17        Plan - 08/12/17 1408    Clinical Impression Statement  Today's skilled session continued to address gait with use of an AFO/large heel wedge, LE strengthening and balance activities with no issues reported. Pt is making steady progress toward goals and should benefit from continued PT to progress toward unmet goals.     Rehab Potential  Good    Clinical Impairments Affecting Rehab Potential  severity of deficits with h/o prior CVA with Rt hemiparesis    PT Frequency  2x / week    PT Duration  8 weeks    PT Treatment/Interventions  ADLs/Self Care Home Management;Stair training;Gait training;DME Instruction;Therapeutic activities;Therapeutic exercise;Balance training;Neuromuscular re-education;Patient/family education;Orthotic Fit/Training;Passive range of motion    PT Next Visit Plan  cont gait and balance training    PT Home Exercise Plan  see above    Consulted and Agree with Plan of Care  Patient    Family Member Consulted  husband        Patient will benefit from skilled therapeutic intervention in order to improve the following deficits and impairments:  Abnormal gait, Decreased endurance, Decreased activity tolerance, Decreased balance, Decreased coordination, Decreased range of motion, Decreased strength, Impaired tone, Impaired UE functional use, Postural dysfunction  Visit Diagnosis: Unsteadiness on feet  Muscle weakness (generalized)  Other abnormalities of gait and  mobility     Problem List Patient Active Problem List   Diagnosis Date Noted  . Acute lower UTI   . Labile blood pressure   . Abnormal urinalysis   . Trauma   . Closed compression fracture of L4 lumbar vertebra (HCC)   . Neurogenic bladder   . History of hypertension   . Anemia of chronic disease   . Hypoalbuminemia due to protein-calorie malnutrition (HCC)   . Acute bilateral cerebral infarction in a watershed distribution 04/05/2017  . Benign essential HTN   . Chronic diastolic heart failure (HCC)   . Right spastic hemiparesis (HCC)   . History of stroke 02/16/2017  . Abnormality of gait following cerebrovascular accident (CVA) 02/16/2017  . Slow transit constipation   .  Acute blood loss anemia   . Adjustment disorder with depressed mood   . Cerebrovascular accident (CVA) (HCC) 02/03/2016  . Dysarthria, post-stroke   . Dysphagia, post-stroke   . Leukocytosis   . Prediabetes   . Right hemiparesis (HCC)   . Cerebral infarction due to unspecified mechanism   . Other secondary hypertension   . Overactive bladder   . Diastolic dysfunction   . Hyperlipidemia   . Essential hypertension   . Essential tremor   . Stroke (cerebrum) (HCC) 02/01/2016  . Facial droop due to stroke 02/01/2016  . Essential and other specified forms of tremor 02/20/2013  . Dysphonia 02/20/2013  . Trigeminal neuralgia 02/20/2013    Sallyanne Kuster, PTA, [facility] [address], [address] [place], [place] [address] [phone] 08/13/17, 11:27 AM   Name: Kelly [name] MRN: [id number] Date of Birth: February 20, 1928

## 2017-08-16 NOTE — Progress Notes (Signed)
[employer] form for ankle foot orthosis sign by Dr.Sethi. RN put in enclosed envelope, and put in mail to send out.

## 2017-08-17 ENCOUNTER — Ambulatory Visit: Payer: Medicare Other | Admitting: Physical Therapy

## 2017-08-17 ENCOUNTER — Encounter: Payer: Self-pay | Admitting: Occupational Therapy

## 2017-08-17 ENCOUNTER — Ambulatory Visit: Payer: Medicare Other | Admitting: Occupational Therapy

## 2017-08-17 DIAGNOSIS — R293 Abnormal posture: Secondary | ICD-10-CM

## 2017-08-17 DIAGNOSIS — R2681 Unsteadiness on feet: Secondary | ICD-10-CM | POA: Diagnosis not present

## 2017-08-17 DIAGNOSIS — M6281 Muscle weakness (generalized): Secondary | ICD-10-CM | POA: Diagnosis not present

## 2017-08-17 DIAGNOSIS — R29818 Other symptoms and signs involving the nervous system: Secondary | ICD-10-CM | POA: Diagnosis not present

## 2017-08-17 DIAGNOSIS — R2689 Other abnormalities of gait and mobility: Secondary | ICD-10-CM

## 2017-08-17 DIAGNOSIS — I69315 Cognitive social or emotional deficit following cerebral infarction: Secondary | ICD-10-CM

## 2017-08-17 DIAGNOSIS — M25641 Stiffness of right hand, not elsewhere classified: Secondary | ICD-10-CM

## 2017-08-17 DIAGNOSIS — M25611 Stiffness of right shoulder, not elsewhere classified: Secondary | ICD-10-CM

## 2017-08-17 DIAGNOSIS — R278 Other lack of coordination: Secondary | ICD-10-CM | POA: Diagnosis not present

## 2017-08-17 DIAGNOSIS — I69351 Hemiplegia and hemiparesis following cerebral infarction affecting right dominant side: Secondary | ICD-10-CM

## 2017-08-17 NOTE — Therapy (Signed)
[facility] [address] [place], [place], [address] Phone: [phone]   Fax:  [phone]  Occupational Therapy Treatment  Patient Details  Name: Kelly HumphreysBennie H Goecke MRN: [id number] Date of Birth: Dec 12, 1927 Referring Provider: Dr Pearlean BrownieSethi   Encounter Date: 08/17/2017  OT End of Session - 08/17/17 1551    Visit Number  7    Number of Visits  17    Date for OT Re-Evaluation  09/24/17    Authorization Type  medicare    Authorization Time Period  60 days    OT Start Time  1446    OT Stop Time  1533    OT Time Calculation (min)  47 min    Activity Tolerance  Patient tolerated treatment well       Past Medical History:  Diagnosis Date  . Acute blood loss anemia   . Adjustment disorder with depressed mood   . CVA (cerebral vascular accident) (HCC) 02/03/2016   Linear infarct within the posterior limb of left internal capsule  . Diastolic CHF (HCC)   . Dysphagia   . Dysphonia 02/20/2013  . Essential and other specified forms of tremor 02/20/2013  . HLD (hyperlipidemia)   . HTN (hypertension)   . OAB (overactive bladder)   . Osteoporosis   . Patient receiving subcutaneous heparin    For DVT prophylaxis 9/17  . Prediabetes   . Psoriasis   . Slow transit constipation   . Stroke (cerebrum) (HCC) 02/01/2016  . Trigeminal neuralgia 02/20/2013    Past Surgical History:  Procedure Laterality Date  . APPENDECTOMY  1940  . bladder tack    . CATARACT EXTRACTION Bilateral   . HEMORRHOID SURGERY  1960  . HUMERUS FRACTURE SURGERY    . LAPAROSCOPIC HYSTERECTOMY  2006  . torn rotator cuff    . WRIST FRACTURE SURGERY  2005   seconday shoulder 2001    There were no vitals filed for this visit.  Subjective Assessment - 08/17/17 1454    Subjective   I picked up a sandwich with my right hand and took a bite    Patient is accompained by:  Family member husband    Pertinent History  L MCA 01/2016, Multiple bilateral CVA's  03/2017, HTN, CHF    Currently in Pain?  No/denies                   OT Treatments/Exercises (OP) - 08/17/17 0001      Neurological Re-education Exercises   Other Exercises 1  Neuro re ed to address trunk control in sitting and standing (thoracic extension and rotation with anterior/posterior tilts incorported into reaching as well as resistive pushing with RUE).  Also addressed alignment with sit to stand and standing in static and dyanmic standing activities with emphasis on decreasing UE support to only RUE support and using LE's and improved trunk alignment for postural control. Stepped to high stool and in high sitting addressed lateral weight shifts with reaching activity to activate trunk as well as unilateral and bilateral reaching and moving of light object.  Pt was also able to initiate release with R hand today.  Functional ambulation with RW with emphasis on using gained trunk extension and improved alignment.                 OT Short Term Goals - 08/17/17 1549      OT SHORT TERM GOAL #1   Title  Patient will complete a home exercise program  designed to improve range of motion in right arm- with min prompting.  Due 08/25/17    Status  On-going      OT SHORT TERM GOAL #2   Title  Patient will complete a home activity program designed to increase functional use of right UE with min prompting    Status  On-going      OT SHORT TERM GOAL #3   Title  Patient will stand at counter and remove 2 lightweight items (silverware, lightweight saucer)  from lower rack of dishwasher with close supervision    Status  On-going      OT SHORT TERM GOAL #4   Title  Patient will reach forward to to obtain a lightweight item (shirt) from lower surface while seated demonstrating 45 degrees of shoulder flexion with sufficient hand opening and grasp.      Status  On-going      OT SHORT TERM GOAL #5   Title  Pt will be able to cut food using bilateral upper extremities and min  assist    Status  On-going      OT SHORT TERM GOAL #6   Title  Patient will bring finger food to mouth once placed in right hand    Time  4    Period  Weeks    Status  Achieved        OT Long Term Goals - 08/17/17 1549      OT LONG TERM GOAL #1   Title  Patient will complete an HEP designed to improve RUE AROM/PROM with mod I due 5/3    Status  On-going      OT LONG TERM GOAL #2   Title  Patient will independently complete a home activity program designed to improve right UE functional ability     Status  On-going      OT LONG TERM GOAL #3   Title  Patient will legibly sign her name with RUE and write a short grocery list    Status  On-going      OT LONG TERM GOAL #4   Title  Patient will load 2-3 lightweight items into dishwasher after each meal- using right upper extremity    Status  On-going      OT LONG TERM GOAL #5   Title  Patient will use RUE to wipe down countertops in kitchen after meals to assist with clean up     Status  On-going      OT LONG TERM GOAL #6   Title  Patient will wash lightweight dishes after meals using BUE's    Status  On-going      OT LONG TERM GOAL #7   Title  Patient will demonstrate ability to use right arm to open cupboard doors and drawers in her home    Status  On-going            Plan - 08/17/17 1549    Clinical Impression Statement  Pt progressing toward goals. Pt reports she used her R hand to pick up sandwich and take a bite this week.    Occupational Profile and client history currently impacting functional performance  Wife, mother, fiercely independent prior to CVA's, active in church    Occupational performance deficits (Please refer to evaluation for details):  ADL's;IADL's;Social Participation;Leisure    Rehab Potential  Good    OT Frequency  2x / week    OT Duration  8 weeks    Plan  Add HEP  for RUE, check STG's, NMR TRUNK / UE, functional use of RUE    Consulted and Agree with Plan of Care  Patient;Family  member/caregiver    Family Member Consulted  husband       Patient will benefit from skilled therapeutic intervention in order to improve the following deficits and impairments:  Decreased coordination, Decreased range of motion, Difficulty walking, Impaired flexibility, Improper body mechanics, Decreased safety awareness, Increased edema, Impaired sensation, Decreased activity tolerance, Impaired tone, Decreased balance, Decreased knowledge of use of DME, Impaired UE functional use, Decreased cognition, Decreased mobility, Decreased strength, Impaired perceived functional ability  Visit Diagnosis: Unsteadiness on feet  Muscle weakness (generalized)  Other abnormalities of gait and mobility  Spastic hemiplegia of right dominant side as late effect of cerebral infarction (HCC)  Abnormal posture  Stiffness of right hand, not elsewhere classified  Stiffness of right shoulder, not elsewhere classified  Cognitive social or emotional deficit following cerebral infarction    Problem List Patient Active Problem List   Diagnosis Date Noted  . Acute lower UTI   . Labile blood pressure   . Abnormal urinalysis   . Trauma   . Closed compression fracture of L4 lumbar vertebra (HCC)   . Neurogenic bladder   . History of hypertension   . Anemia of chronic disease   . Hypoalbuminemia due to protein-calorie malnutrition (HCC)   . Acute bilateral cerebral infarction in a watershed distribution 04/05/2017  . Benign essential HTN   . Chronic diastolic heart failure (HCC)   . Right spastic hemiparesis (HCC)   . History of stroke 02/16/2017  . Abnormality of gait following cerebrovascular accident (CVA) 02/16/2017  . Slow transit constipation   . Acute blood loss anemia   . Adjustment disorder with depressed mood   . Cerebrovascular accident (CVA) (HCC) 02/03/2016  . Dysarthria, post-stroke   . Dysphagia, post-stroke   . Leukocytosis   . Prediabetes   . Right hemiparesis (HCC)   .  Cerebral infarction due to unspecified mechanism   . Other secondary hypertension   . Overactive bladder   . Diastolic dysfunction   . Hyperlipidemia   . Essential hypertension   . Essential tremor   . Stroke (cerebrum) (HCC) 02/01/2016  . Facial droop due to stroke 02/01/2016  . Essential and other specified forms of tremor 02/20/2013  . Dysphonia 02/20/2013  . Trigeminal neuralgia 02/20/2013    Norton Pastel, OTR/L 08/17/2017, 3:52 PM  [facility] [address] [place], [place], [address] Phone: [phone]   Fax:  [phone]  Name: ERICAH SCOTTO MRN: [id number] Date of Birth: 1927/06/14

## 2017-08-18 ENCOUNTER — Encounter: Payer: Self-pay | Admitting: Physical Therapy

## 2017-08-18 NOTE — Therapy (Addendum)
[facility] [address] [place], [place], [address] Phone: [phone]   Fax:  [phone]  Physical Therapy Treatment  Patient Details  Name: Kelly [name] MRN: [id number] Date of Birth: 1927/06/24 Referring Provider: Dr Pearlean Brownie   Encounter Date: 08/17/2017    Past Medical History:  Diagnosis Date  . Acute blood loss anemia   . Adjustment disorder with depressed mood   . CVA (cerebral vascular accident) (HCC) 02/03/2016   Linear infarct within the posterior limb of left internal capsule  . Diastolic CHF (HCC)   . Dysphagia   . Dysphonia 02/20/2013  . Essential and other specified forms of tremor 02/20/2013  . HLD (hyperlipidemia)   . HTN (hypertension)   . OAB (overactive bladder)   . Osteoporosis   . Patient receiving subcutaneous heparin    For DVT prophylaxis 9/17  . Prediabetes   . Psoriasis   . Slow transit constipation   . Stroke (cerebrum) (HCC) 02/01/2016  . Trigeminal neuralgia 02/20/2013    Past Surgical History:  Procedure Laterality Date  . APPENDECTOMY  1940  . bladder tack    . CATARACT EXTRACTION Bilateral   . HEMORRHOID SURGERY  1960  . HUMERUS FRACTURE SURGERY    . LAPAROSCOPIC HYSTERECTOMY  2006  . torn rotator cuff    . WRIST FRACTURE SURGERY  2005   seconday shoulder 2001    There were no vitals filed for this visit.                    [facility] Adult PT Treatment/Exercise - 08/22/17 0001      Transfers   Transfers  Sit to Stand;Stand to Sit    Sit to Stand  4: Min guard    Stand to Sit  4: Min guard      Ambulation/Gait   Ambulation/Gait  Yes    Ambulation/Gait Assistance  4: Min guard    Ambulation/Gait Assistance Details  RW raised 1 notch to facilitate more upright posture  Foot up brace and heel wedge used on RLE    Assistive device  Rolling walker built up handle on right, AFO/heel wedge right shoe    Gait Pattern  Decreased hip/knee flexion -  right;Decreased dorsiflexion - right;Decreased weight shift to right;Decreased step length - right    Ambulation Surface  Level;Indoor          Balance Exercises - 08/22/17 1428      Balance Exercises: Standing   Other Standing Exercises  Pt performed standing forward kicks, backward and side kicks x 10 reps each leg; marching in place x 10 reps; sidestepping 2 reps inside // bars with bil. UE support           PT Short Term Goals - 07/26/17 2130      PT SHORT TERM GOAL #1   Title  Pt will perform sit to stand 5 reps from mat without UE support to demo improved LE strength.    Baseline  UE support needed    Time  4    Period  Weeks    Status  New    Target Date  08/25/17      PT SHORT TERM GOAL #2   Title  Pt will receive consult for custom AFO for RLE.    Baseline  scheduled at [employer] on 07-27-17    Time  4    Period  Weeks    Status  New    Target Date  08/25/17      PT SHORT TERM GOAL #3   Title  Pt will stand for at least 2" without UE support with SBA to increase independence with ADL's.    Baseline   UE support needed    Time  4    Period  Weeks    Status  New    Target Date  08/25/17      PT SHORT TERM GOAL #4   Title  Incr. gait velocity to >/= .9 ft/sec with RW for incr. gait efficiency.    Baseline  .54 ft/sec with RW (no AFO used)    Time  4    Period  Weeks    Status  New    Target Date  08/25/17      PT SHORT TERM GOAL #5   Title  Pt will amb. with RW 350' with SBA on flat, even surface.    Baseline  575' with RW    Time  4    Period  Weeks    Status  New    Target Date  08/25/17        PT Long Term Goals - 07/26/17 2142      PT LONG TERM GOAL #1   Title  Pt will increase gait velocity from .54 ft/sec to >/= 1.2 ft/sec with RW with AFO on RLE for incr. gait efficiency.    Baseline  .54 ft/sec with RW    Time  8    Period  Weeks    Status  New    Target Date  09/24/17      PT LONG TERM GOAL #2   Title  Pt will stand for at least 10"  with UE support prn and reach 6" outside BOS without LOB for incr. independence and safety with ADL's.    Time  8    Period  Weeks    Status  New    Target Date  09/24/17      PT LONG TERM GOAL #3   Title  Pt will negotiate 4 steps with Lt hand rail with CGA using a step by step sequence.    Time  8    Period  Weeks    Status  New    Target Date  09/24/17      PT LONG TERM GOAL #4   Title  Independent in HEP for RLE strengthening and balance exercises.    Time  8    Period  Weeks    Status  New    Target Date  09/24/17      PT LONG TERM GOAL #5   Title  Pt will amb. 500' outside with RW with AFO on RLE with SBA to allow pt to return walking in the park per her stated goal.    Time  8    Period  Weeks    Status  New    Target Date  09/24/17            Plan - 08/22/17 1430    Clinical Impression Statement  Pt able to stand more erect with RW raised 1 notch; foot up brace used with heel wedge which improved gait pattern by increasing foot clearance and decreasing genu recurvatum    Rehab Potential  Good    Clinical Impairments Affecting Rehab Potential  severity of deficits with h/o prior CVA with Rt hemiparesis    PT Frequency  2x / week    PT Duration  8 weeks    PT Treatment/Interventions  ADLs/Self Care Home Management;Stair training;Gait training;DME Instruction;Therapeutic activities;Therapeutic exercise;Balance training;Neuromuscular re-education;Patient/family education;Orthotic Fit/Training;Passive range of motion    PT Next Visit Plan  cont gait and balance training    PT Home Exercise Plan  see above    Consulted and Agree with Plan of Care  Patient    Family Member Consulted  husband        Patient will benefit from skilled therapeutic intervention in order to improve the following deficits and impairments:  Abnormal gait, Decreased endurance, Decreased activity tolerance, Decreased balance, Decreased coordination, Decreased range of motion, Decreased  strength, Impaired tone, Impaired UE functional use, Postural dysfunction  Visit Diagnosis: Unsteadiness on feet  Other abnormalities of gait and mobility     Problem List Patient Active Problem List   Diagnosis Date Noted  . Acute lower UTI   . Labile blood pressure   . Abnormal urinalysis   . Trauma   . Closed compression fracture of L4 lumbar vertebra (HCC)   . Neurogenic bladder   . History of hypertension   . Anemia of chronic disease   . Hypoalbuminemia due to protein-calorie malnutrition (HCC)   . Acute bilateral cerebral infarction in a watershed distribution 04/05/2017  . Benign essential HTN   . Chronic diastolic heart failure (HCC)   . Right spastic hemiparesis (HCC)   . History of stroke 02/16/2017  . Abnormality of gait following cerebrovascular accident (CVA) 02/16/2017  . Slow transit constipation   . Acute blood loss anemia   . Adjustment disorder with depressed mood   . Cerebrovascular accident (CVA) (HCC) 02/03/2016  . Dysarthria, post-stroke   . Dysphagia, post-stroke   . Leukocytosis   . Prediabetes   . Right hemiparesis (HCC)   . Cerebral infarction due to unspecified mechanism   . Other secondary hypertension   . Overactive bladder   . Diastolic dysfunction   . Hyperlipidemia   . Essential hypertension   . Essential tremor   . Stroke (cerebrum) (HCC) 02/01/2016  . Facial droop due to stroke 02/01/2016  . Essential and other specified forms of tremor 02/20/2013  . Dysphonia 02/20/2013  . Trigeminal neuralgia 02/20/2013    Kary Kos, PT 08/22/2017, 2:32 PM  [facility] [address] [place], [place], [address] Phone: [phone]   Fax:  [phone]  Name: Kelly [name] MRN: [id number] Date of Birth: 11-08-1927

## 2017-08-19 ENCOUNTER — Ambulatory Visit: Payer: Medicare Other | Admitting: Occupational Therapy

## 2017-08-19 ENCOUNTER — Ambulatory Visit: Payer: Medicare Other | Admitting: Physical Therapy

## 2017-08-19 ENCOUNTER — Encounter: Payer: Medicare Other | Admitting: Physical Medicine & Rehabilitation

## 2017-08-20 ENCOUNTER — Ambulatory Visit: Payer: Medicare Other | Admitting: Physical Therapy

## 2017-08-23 ENCOUNTER — Ambulatory Visit: Payer: Medicare Other | Admitting: Occupational Therapy

## 2017-08-23 ENCOUNTER — Encounter: Payer: Self-pay | Admitting: Occupational Therapy

## 2017-08-23 ENCOUNTER — Encounter: Payer: Self-pay | Admitting: Physical Therapy

## 2017-08-23 ENCOUNTER — Ambulatory Visit: Payer: Medicare Other | Attending: Neurology | Admitting: Physical Therapy

## 2017-08-23 DIAGNOSIS — I6389 Other cerebral infarction: Secondary | ICD-10-CM | POA: Diagnosis not present

## 2017-08-23 DIAGNOSIS — R293 Abnormal posture: Secondary | ICD-10-CM | POA: Diagnosis not present

## 2017-08-23 DIAGNOSIS — M25641 Stiffness of right hand, not elsewhere classified: Secondary | ICD-10-CM

## 2017-08-23 DIAGNOSIS — I69351 Hemiplegia and hemiparesis following cerebral infarction affecting right dominant side: Secondary | ICD-10-CM | POA: Diagnosis not present

## 2017-08-23 DIAGNOSIS — R2681 Unsteadiness on feet: Secondary | ICD-10-CM

## 2017-08-23 DIAGNOSIS — R2689 Other abnormalities of gait and mobility: Secondary | ICD-10-CM

## 2017-08-23 DIAGNOSIS — I639 Cerebral infarction, unspecified: Secondary | ICD-10-CM | POA: Insufficient documentation

## 2017-08-23 DIAGNOSIS — M6281 Muscle weakness (generalized): Secondary | ICD-10-CM | POA: Insufficient documentation

## 2017-08-23 DIAGNOSIS — I69315 Cognitive social or emotional deficit following cerebral infarction: Secondary | ICD-10-CM | POA: Diagnosis not present

## 2017-08-23 DIAGNOSIS — M25611 Stiffness of right shoulder, not elsewhere classified: Secondary | ICD-10-CM | POA: Insufficient documentation

## 2017-08-23 DIAGNOSIS — R29818 Other symptoms and signs involving the nervous system: Secondary | ICD-10-CM | POA: Diagnosis not present

## 2017-08-23 DIAGNOSIS — R278 Other lack of coordination: Secondary | ICD-10-CM | POA: Diagnosis not present

## 2017-08-23 NOTE — Therapy (Signed)
[facility] [address] [place], [place], [address] Phone: [phone]   Fax:  [phone]  Occupational Therapy Treatment  Patient Details  Name: Kelly [name] MRN: [id number] Date of Birth: [birth year] Referring Provider: Dr Pearlean Brownie   Encounter Date: 08/23/2017  OT End of Session - 08/23/17 1720    Visit Number  8    Number of Visits  17    Date for OT Re-Evaluation  09/24/17    Authorization Type  medicare    Authorization Time Period  60 days    OT Start Time  1531    OT Stop Time  1614    OT Time Calculation (min)  43 min    Activity Tolerance  Patient limited by pain       Past Medical History:  Diagnosis Date  . Acute blood loss anemia   . Adjustment disorder with depressed mood   . CVA (cerebral vascular accident) (HCC) 02/03/2016   Linear infarct within the posterior limb of left internal capsule  . Diastolic CHF (HCC)   . Dysphagia   . Dysphonia 02/20/2013  . Essential and other specified forms of tremor 02/20/2013  . HLD (hyperlipidemia)   . HTN (hypertension)   . OAB (overactive bladder)   . Osteoporosis   . Patient receiving subcutaneous heparin    For DVT prophylaxis 9/17  . Prediabetes   . Psoriasis   . Slow transit constipation   . Stroke (cerebrum) (HCC) 02/01/2016  . Trigeminal neuralgia 02/20/2013    Past Surgical History:  Procedure Laterality Date  . APPENDECTOMY  1940  . bladder tack    . CATARACT EXTRACTION Bilateral   . HEMORRHOID SURGERY  1960  . HUMERUS FRACTURE SURGERY    . LAPAROSCOPIC HYSTERECTOMY  2006  . torn rotator cuff    . WRIST FRACTURE SURGERY  2005   seconday shoulder 2001    There were no vitals filed for this visit.  Subjective Assessment - 08/23/17 1535    Subjective   I hurt all over and now I am afraid to walk or go to the bathroom    Patient is accompained by:  Family member husband    Pertinent History  L MCA 01/2016, Multiple bilateral CVA's  03/2017, HTN, CHF    Currently in Pain?  Yes soreness all over my body from fall - husband reports that pt has several bruises on her right side.                    OT Treatments/Exercises (OP) - 08/23/17 0001      ADLs   Eating  Addressed self feeding with finger foods - pt able to bring food to mouth using R hand after food is placed in R hand.  Pt also able to use both hands together to bring drink to mouth. Pt to begin to do this at home using small plastic cup at meals.     Home Maintenance  Addressed simple kitchen activity (unloading light objects from bottom shelf of dishwasher) - pt fearful of falling.  Pt able to complete task with close supervision and encouragement.     ADL Comments  Checked STG's - see goals for update. Pt had recent fall in bathroom - pt states she felt lightheaded and then fell.  Pt feels it was due to low blood pressure. Pt c/o of soreness "all over my body from the fall."  Pt with increased tightness in R hand today  likely due to fear of  movement and pain.  By end of session pt with improved ROM in R hand.       Neurological Re-education Exercises   Other Exercises 1  Addressed grasp and release with pt - focused on pt's ability to "turn R hand off" as precursor to active extension using cylindrical object as well as round object. WIth practice pt able to grasp once placed in hand and then release with cues.  Will continue to address.                OT Short Term Goals - 08/23/17 1717      OT SHORT TERM GOAL #1   Title  Patient will complete a home exercise program designed to improve range of motion in right arm- with min prompting.  Due 08/25/17    Status  On-going      OT SHORT TERM GOAL #2   Title  Patient will complete a home activity program designed to increase functional use of right UE with min prompting    Status  On-going      OT SHORT TERM GOAL #3   Title  Patient will stand at counter and remove 2 lightweight items  (silverware, lightweight saucer)  from lower rack of dishwasher with close supervision    Status  Achieved      OT SHORT TERM GOAL #4   Title  Patient will reach forward to to obtain a lightweight item (shirt) from lower surface while seated demonstrating 45 degrees of shoulder flexion with sufficient hand opening and grasp.      Status  On-going      OT SHORT TERM GOAL #5   Title  Pt will be able to cut food using bilateral upper extremities and min assist    Status  On-going      OT SHORT TERM GOAL #6   Title  Patient will bring finger food to mouth once placed in right hand    Time  4    Period  Weeks    Status  Achieved        OT Long Term Goals - 08/23/17 1718      OT LONG TERM GOAL #1   Title  Patient will complete an HEP designed to improve RUE AROM/PROM with mod I due 5/3    Status  On-going      OT LONG TERM GOAL #2   Title  Patient will independently complete a home activity program designed to improve right UE functional ability     Status  On-going      OT LONG TERM GOAL #3   Title  Patient will legibly sign her name with RUE and write a short grocery list    Status  On-going      OT LONG TERM GOAL #4   Title  Patient will load 2-3 lightweight items into dishwasher after each meal- using right upper extremity    Status  On-going      OT LONG TERM GOAL #5   Title  Patient will use RUE to wipe down countertops in kitchen after meals to assist with clean up     Status  On-going      OT LONG TERM GOAL #6   Title  Patient will wash lightweight dishes after meals using BUE's    Status  On-going      OT LONG TERM GOAL #7   Title  Patient will demonstrate ability to use right  arm to open cupboard doors and drawers in her home    Status  On-going            Plan - 08/23/17 1718    Clinical Impression Statement  Pt with slow progress toward goals. Pt and husband report that pt fell in the bathroom - pt feels this was due to low BP.  Pt fearful of falling  again as well as sore from fall.  WIll continue to address STG"s as pt can tolerate.     Occupational Profile and client history currently impacting functional performance  Wife, mother, fiercely independent prior to CVA's, active in church    Occupational performance deficits (Please refer to evaluation for details):  ADL's;IADL's;Social Participation;Leisure    Rehab Potential  Good    OT Frequency  2x / week    OT Duration  8 weeks    OT Treatment/Interventions  Self-care/ADL training;Moist Heat;Fluidtherapy;DME and/or AE instruction;Splinting;Balance training;Therapeutic activities;Therapeutic exercise;Cognitive remediation/compensation;Neuromuscular education;Functional Mobility Training;Patient/family education;Manual Therapy;Paraffin;Electrical Stimulation    Plan  Add HEP for RUE, NMR TRUNK / UE, functional use of RUE, balance, functional mobility    Consulted and Agree with Plan of Care  Patient;Family member/caregiver    Family Member Consulted  husband       Patient will benefit from skilled therapeutic intervention in order to improve the following deficits and impairments:  Decreased coordination, Decreased range of motion, Difficulty walking, Impaired flexibility, Improper body mechanics, Decreased safety awareness, Increased edema, Impaired sensation, Decreased activity tolerance, Impaired tone, Decreased balance, Decreased knowledge of use of DME, Impaired UE functional use, Decreased cognition, Decreased mobility, Decreased strength, Impaired perceived functional ability  Visit Diagnosis: Unsteadiness on feet  Muscle weakness (generalized)  Other abnormalities of gait and mobility  Spastic hemiplegia of right dominant side as late effect of cerebral infarction (HCC)  Abnormal posture  Stiffness of right hand, not elsewhere classified  Stiffness of right shoulder, not elsewhere classified  Cognitive social or emotional deficit following cerebral infarction  Other  symptoms and signs involving the nervous system    Problem List Patient Active Problem List   Diagnosis Date Noted  . Acute lower UTI   . Labile blood pressure   . Abnormal urinalysis   . Trauma   . Closed compression fracture of L4 lumbar vertebra   . Neurogenic bladder   . History of hypertension   . Anemia of chronic disease   . Hypoalbuminemia due to protein-calorie malnutrition (HCC)   . Acute bilateral cerebral infarction in a watershed distribution [facility]) 04/05/2017  . Benign essential HTN   . Chronic diastolic heart failure (HCC)   . Right spastic hemiparesis (HCC)   . History of stroke 02/16/2017  . Abnormality of gait following cerebrovascular accident (CVA) 02/16/2017  . Slow transit constipation   . Acute blood loss anemia   . Adjustment disorder with depressed mood   . Cerebrovascular accident (CVA) (HCC) 02/03/2016  . Dysarthria, post-stroke   . Dysphagia, post-stroke   . Leukocytosis   . Prediabetes   . Right hemiparesis (HCC)   . Cerebral infarction due to unspecified mechanism   . Other secondary hypertension   . Overactive bladder   . Diastolic dysfunction   . Hyperlipidemia   . Essential hypertension   . Essential tremor   . Stroke (cerebrum) (HCC) 02/01/2016  . Facial droop due to stroke 02/01/2016  . Essential and other specified forms of tremor 02/20/2013  . Dysphonia 02/20/2013  . Trigeminal neuralgia 02/20/2013    Laquon Emel,  Hardie Pulley, OTR/L 08/23/2017, 5:22 PM  [facility] [address] [place], [place], [address] Phone: [phone]   Fax:  [phone]  Name: Kelly [name] MRN: [id number] Date of Birth: Apr 01, 1928

## 2017-08-23 NOTE — Therapy (Signed)
[facility] [address] [place], [place], [address] Phone: [phone]   Fax:  [phone]  Physical Therapy Treatment  Patient Details  Name: Kelly [name] MRN: [id number] Date of Birth: 1928-02-25 Referring Provider: Dr Pearlean Brownie   Encounter Date: 08/23/2017  PT End of Session - 08/23/17 2145    Visit Number  7    Number of Visits  17    Date for PT Re-Evaluation  09/24/17    Authorization Type  Medicare/[facility]    Authorization Time Period  07-26-17 - 09-24-17    PT Start Time  1447    PT Stop Time  1531    PT Time Calculation (min)  44 min       Past Medical History:  Diagnosis Date  . Acute blood loss anemia   . Adjustment disorder with depressed mood   . CVA (cerebral vascular accident) (HCC) 02/03/2016   Linear infarct within the posterior limb of left internal capsule  . Diastolic CHF (HCC)   . Dysphagia   . Dysphonia 02/20/2013  . Essential and other specified forms of tremor 02/20/2013  . HLD (hyperlipidemia)   . HTN (hypertension)   . OAB (overactive bladder)   . Osteoporosis   . Patient receiving subcutaneous heparin    For DVT prophylaxis 9/17  . Prediabetes   . Psoriasis   . Slow transit constipation   . Stroke (cerebrum) (HCC) 02/01/2016  . Trigeminal neuralgia 02/20/2013    Past Surgical History:  Procedure Laterality Date  . APPENDECTOMY  1940  . bladder tack    . CATARACT EXTRACTION Bilateral   . HEMORRHOID SURGERY  1960  . HUMERUS FRACTURE SURGERY    . LAPAROSCOPIC HYSTERECTOMY  2006  . torn rotator cuff    . WRIST FRACTURE SURGERY  2005   seconday shoulder 2001    There were no vitals filed for this visit.  Subjective Assessment - 08/23/17 2140    Subjective  Pt states she had fall in bathroom last week - has bruise on Rt side of head; she states she fell from seated position off toilet, husband says he is not so sure - thinks she was standing and lost her balance    Patient is  accompained by:  Family member    Pertinent History  Rt MCA CVA on 02-03-16:  Cryptogenic CVA on 04-05-17; pt received OP PT at this facility from 06-01-16 - 11-19-16    Patient Stated Goals  "I would like to walk in the park"                        [facility] Adult PT Treatment/Exercise - 08/23/17 0001      Transfers   Transfers  Sit to Stand;Stand to Sit    Sit to Stand  4: Min guard    Stand to Sit  4: Min guard    Number of Reps  Other reps (comment) 5    Comments  used LUE support;      Ambulation/Gait   Ambulation/Gait  Yes    Ambulation/Gait Assistance  4: Min guard    Ambulation/Gait Assistance Details  toe off brace and heel wedge used in Rt shoe    Ambulation Distance (Feet)  230 Feet    Assistive device  Rolling walker    Gait Pattern  Decreased hip/knee flexion - right;Decreased dorsiflexion - right;Decreased weight shift to right;Decreased step length - right    Ambulation Surface  Level;Indoor    Gait Comments  toe off AFO and heel wedge used in Rt shoe          Balance Exercises - 08/22/17 1428      Balance Exercises: Standing   Other Standing Exercises  Pt performed standing forward kicks, backward and side kicks x 10 reps each leg; marching in place x 10 reps; sidestepping 2 reps inside // bars with bil. UE support      Pt performed side kicks 10 reps each - alternating LE's Performed stepping over and back of orange hurdle with each LE 10 reps each with LUE on countertop for support with min hand held assist on Rt side  Pt performed ladder activity x 2 reps with RW for support with min to mod assist   PT Short Term Goals - 07/26/17 2130      PT SHORT TERM GOAL #1   Title  Pt will perform sit to stand 5 reps from mat without UE support to demo improved LE strength.    Baseline  UE support needed    Time  4    Period  Weeks    Status  New    Target Date  08/25/17      PT SHORT TERM GOAL #2   Title  Pt will receive consult for custom AFO for  RLE.    Baseline  scheduled at [facility] on 07-27-17    Time  4    Period  Weeks    Status  New    Target Date  08/25/17      PT SHORT TERM GOAL #3   Title  Pt will stand for at least 2" without UE support with SBA to increase independence with ADL's.    Baseline   UE support needed    Time  4    Period  Weeks    Status  New    Target Date  08/25/17      PT SHORT TERM GOAL #4   Title  Incr. gait velocity to >/= .9 ft/sec with RW for incr. gait efficiency.    Baseline  .54 ft/sec with RW (no AFO used)    Time  4    Period  Weeks    Status  New    Target Date  08/25/17      PT SHORT TERM GOAL #5   Title  Pt will amb. with RW 350' with SBA on flat, even surface.    Baseline  5175' with RW    Time  4    Period  Weeks    Status  New    Target Date  08/25/17        PT Long Term Goals - 07/26/17 2142      PT LONG TERM GOAL #1   Title  Pt will increase gait velocity from .54 ft/sec to >/= 1.2 ft/sec with RW with AFO on RLE for incr. gait efficiency.    Baseline  .54 ft/sec with RW    Time  8    Period  Weeks    Status  New    Target Date  09/24/17      PT LONG TERM GOAL #2   Title  Pt will stand for at least 10" with UE support prn and reach 6" outside BOS without LOB for incr. independence and safety with ADL's.    Time  8    Period  Weeks    Status  New  Target Date  09/24/17      PT LONG TERM GOAL #3   Title  Pt will negotiate 4 steps with Lt hand rail with CGA using a step by step sequence.    Time  8    Period  Weeks    Status  New    Target Date  09/24/17      PT LONG TERM GOAL #4   Title  Independent in HEP for RLE strengthening and balance exercises.    Time  8    Period  Weeks    Status  New    Target Date  09/24/17      PT LONG TERM GOAL #5   Title  Pt will amb. 500' outside with RW with AFO on RLE with SBA to allow pt to return walking in the park per her stated goal.    Time  8    Period  Weeks    Status  New    Target Date  09/24/17             Plan - 08/23/17 2146    Clinical Impression Statement  Pt continues to amb. with forward flexed posture due to kyphosis and with gait deviations including decr. Rt foot clearance with genu recurvatum; foot up brace and heel wedge reduced deviations but did not eliminate them    Rehab Potential  Good    Clinical Impairments Affecting Rehab Potential  severity of deficits with h/o prior CVA with Rt hemiparesis    PT Frequency  2x / week    PT Duration  8 weeks    PT Treatment/Interventions  ADLs/Self Care Home Management;Stair training;Gait training;DME Instruction;Therapeutic activities;Therapeutic exercise;Balance training;Neuromuscular re-education;Patient/family education;Orthotic Fit/Training;Passive range of motion    PT Next Visit Plan  check STG's    PT Home Exercise Plan  see above    Consulted and Agree with Plan of Care  Patient       Patient will benefit from skilled therapeutic intervention in order to improve the following deficits and impairments:  Abnormal gait, Decreased endurance, Decreased activity tolerance, Decreased balance, Decreased coordination, Decreased range of motion, Decreased strength, Impaired tone, Impaired UE functional use, Postural dysfunction  Visit Diagnosis: Other abnormalities of gait and mobility  Unsteadiness on feet     Problem List Patient Active Problem List   Diagnosis Date Noted  . Acute lower UTI   . Labile blood pressure   . Abnormal urinalysis   . Trauma   . Closed compression fracture of L4 lumbar vertebra   . Neurogenic bladder   . History of hypertension   . Anemia of chronic disease   . Hypoalbuminemia due to protein-calorie malnutrition (HCC)   . Acute bilateral cerebral infarction in a watershed distribution [facility]) 04/05/2017  . Benign essential HTN   . Chronic diastolic heart failure (HCC)   . Right spastic hemiparesis (HCC)   . History of stroke 02/16/2017  . Abnormality of gait following cerebrovascular  accident (CVA) 02/16/2017  . Slow transit constipation   . Acute blood loss anemia   . Adjustment disorder with depressed mood   . Cerebrovascular accident (CVA) (HCC) 02/03/2016  . Dysarthria, post-stroke   . Dysphagia, post-stroke   . Leukocytosis   . Prediabetes   . Right hemiparesis (HCC)   . Cerebral infarction due to unspecified mechanism   . Other secondary hypertension   . Overactive bladder   . Diastolic dysfunction   . Hyperlipidemia   . Essential hypertension   .  Essential tremor   . Stroke (cerebrum) (HCC) 02/01/2016  . Facial droop due to stroke 02/01/2016  . Essential and other specified forms of tremor 02/20/2013  . Dysphonia 02/20/2013  . Trigeminal neuralgia 02/20/2013    Kary Kos, PT 08/23/2017, 9:50 PM  [facility] [address] [place], [place], [address] Phone: [phone]   Fax:  [phone]  Name: Kelly [name] MRN: [id number] Date of Birth: [birth year]

## 2017-08-24 ENCOUNTER — Ambulatory Visit: Payer: Medicare Other | Admitting: Occupational Therapy

## 2017-08-24 ENCOUNTER — Ambulatory Visit: Payer: Medicare Other | Admitting: Physical Therapy

## 2017-08-24 ENCOUNTER — Encounter: Payer: Self-pay | Admitting: Occupational Therapy

## 2017-08-24 DIAGNOSIS — M25641 Stiffness of right hand, not elsewhere classified: Secondary | ICD-10-CM

## 2017-08-24 DIAGNOSIS — R29818 Other symptoms and signs involving the nervous system: Secondary | ICD-10-CM

## 2017-08-24 DIAGNOSIS — R2681 Unsteadiness on feet: Secondary | ICD-10-CM

## 2017-08-24 DIAGNOSIS — M6281 Muscle weakness (generalized): Secondary | ICD-10-CM

## 2017-08-24 DIAGNOSIS — M25611 Stiffness of right shoulder, not elsewhere classified: Secondary | ICD-10-CM | POA: Diagnosis not present

## 2017-08-24 DIAGNOSIS — I69351 Hemiplegia and hemiparesis following cerebral infarction affecting right dominant side: Secondary | ICD-10-CM | POA: Diagnosis not present

## 2017-08-24 DIAGNOSIS — R2689 Other abnormalities of gait and mobility: Secondary | ICD-10-CM | POA: Diagnosis not present

## 2017-08-24 DIAGNOSIS — I69315 Cognitive social or emotional deficit following cerebral infarction: Secondary | ICD-10-CM

## 2017-08-24 DIAGNOSIS — R293 Abnormal posture: Secondary | ICD-10-CM

## 2017-08-24 NOTE — Patient Instructions (Signed)
Lying on your back with a large beach ball in your hands.  Press the beach ball upwards toward the ceiling. Try to straighten your elbows as much as possible.   10 times, repeat.    Press the ball up toward the ceiling so your arms are straight, then slowly move slightly upwards toward your forehead, an downwards toward your chin.  Complete 2 times and repeat.  Press the ball upward toward the ceiling so your arms are straight, then bend your elbows so the ball moves toward the top of your head, then back up to the ceiling, 10 times, repeat.  You may need help here to hold your right upper arm still.

## 2017-08-24 NOTE — Therapy (Signed)
[facility] [address] [place], [place], [address] Phone: [phone]   Fax:  [phone]  Occupational Therapy Treatment  Patient Details  Name: Kelly [name] MRN: [id number] Date of Birth: 10/02/1927 Referring Provider: Dr Sethi   Encounter Date: 08/24/2017  OT End of Session - 08/24/17 1718    Visit Number  9    Number of Visits  17    Date for OT Re-Evaluation  09/24/17    Authorization Type  medicare    Authorization Time Period  60 days    OT Start Time  1535    OT Stop Time  1615    OT Time Calculation (min)  40 min    Activity Tolerance  Patient tolerated treatment well    Behavior During Therapy  WFL for tasks assessed/performed       Past Medical History:  Diagnosis Date  . Acute blood loss anemia   . Adjustment disorder with depressed mood   . CVA (cerebral vascular accident) (HCC) 02/03/2016   Linear infarct within the posterior limb of left internal capsule  . Diastolic CHF (HCC)   . Dysphagia   . Dysphonia 02/20/2013  . Essential and other specified forms of tremor 02/20/2013  . HLD (hyperlipidemia)   . HTN (hypertension)   . OAB (overactive bladder)   . Osteoporosis   . Patient receiving subcutaneous heparin    For DVT prophylaxis 9/17  . Prediabetes   . Psoriasis   . Slow transit constipation   . Stroke (cerebrum) (HCC) 02/01/2016  . Trigeminal neuralgia 02/20/2013    Past Surgical History:  Procedure Laterality Date  . APPENDECTOMY  1940  . bladder tack    . CATARACT EXTRACTION Bilateral   . HEMORRHOID SURGERY  1960  . HUMERUS FRACTURE SURGERY    . LAPAROSCOPIC HYSTERECTOMY  2006  . torn rotator cuff    . WRIST FRACTURE SURGERY  2005   seconday shoulder 2001    There were no vitals filed for this visit.  Subjective Assessment - 08/24/17 1537    Subjective   That's working real well - using right hand to assist with self feeding    Patient is accompained by:  Family member     Pertinent History  L MCA 01/2016, Multiple bilateral CVA's 03/2017, HTN, CHF    Currently in Pain?  No/denies    Pain Score  0-No pain                   OT Treatments/Exercises (OP) - 08/24/17 0001      ADLs   Eating  Patient has a goal related to cutting food using both hands.  Worked on componenets of this goal.  Patient able to pick up fork from table with right hand, and release it as desired.  Patient has difficulty orienting the fork to food to be cut.  Patient has difficulty applying sufficient pressure to stabilize food while left hand cuts.  Will continue to address periodically.        Neurological Re-education Exercises   Other Exercises 1  Reviewed prior HEP for shoulder.  Patient currently completes a self range of motion program in supine. Patient instructed to not drag her right arm, and program was modified slightly to allow for safe self range of motion.  Reviewed active assisted exercises in supine with a beach ball.  Patient was able to actively use right arm for chest press pattern, isolated elbow flex and extension, and   shoulder flex and extend in small ranges with realtive elbow flexion.  See patient instructions.                 OT Education - 08/24/17 1717    Education provided  Yes    Education Details  review and update HEP for RUE    Person(s) Educated  Patient;Spouse    Methods  Explanation;Demonstration;Handout    Comprehension  Need further instruction;Verbalized understanding       OT Short Term Goals - 08/24/17 1719      OT SHORT TERM GOAL #1   Title  Patient will complete a home exercise program designed to improve range of motion in right arm- with min prompting.  Due 08/25/17    Status  On-going      OT SHORT TERM GOAL #2   Title  Patient will complete a home activity program designed to increase functional use of right UE with min prompting    Status  Achieved      OT SHORT TERM GOAL #3   Title  Patient will stand at counter  and remove 2 lightweight items (silverware, lightweight saucer)  from lower rack of dishwasher with close supervision    Status  Achieved      OT SHORT TERM GOAL #4   Title  Patient will reach forward to to obtain a lightweight item (shirt) from lower surface while seated demonstrating 45 degrees of shoulder flexion with sufficient hand opening and grasp.      Status  Achieved      OT SHORT TERM GOAL #5   Title  Pt will be able to cut food using bilateral upper extremities and min assist    Status  Not Met continue goal      OT SHORT TERM GOAL #6   Title  Patient will bring finger food to mouth once placed in right hand    Status  Achieved        OT Long Term Goals - 08/23/17 1718      OT LONG TERM GOAL #1   Title  Patient will complete an HEP designed to improve RUE AROM/PROM with mod I due 5/3    Status  On-going      OT LONG TERM GOAL #2   Title  Patient will independently complete a home activity program designed to improve right UE functional ability     Status  On-going      OT LONG TERM GOAL #3   Title  Patient will legibly sign her name with RUE and write a short grocery list    Status  On-going      OT LONG TERM GOAL #4   Title  Patient will load 2-3 lightweight items into dishwasher after each meal- using right upper extremity    Status  On-going      OT LONG TERM GOAL #5   Title  Patient will use RUE to wipe down countertops in kitchen after meals to assist with clean up     Status  On-going      OT LONG TERM GOAL #6   Title  Patient will wash lightweight dishes after meals using BUE's    Status  On-going      OT LONG TERM GOAL #7   Title  Patient will demonstrate ability to use right arm to open cupboard doors and drawers in her home    Status  On-going              Plan - 08/24/17 1718    Clinical Impression Statement  Patient showing slow progress toward OT goals, although very pleased with recent increase in functional use of right hand.      Occupational Profile and client history currently impacting functional performance  Wife, mother, fiercely independent prior to CVA's, active in church    Occupational performance deficits (Please refer to evaluation for details):  ADL's;IADL's;Social Participation;Leisure    Rehab Potential  Good    OT Frequency  2x / week    OT Duration  8 weeks    OT Treatment/Interventions  Self-care/ADL training;Moist Heat;Fluidtherapy;DME and/or AE instruction;Splinting;Balance training;Therapeutic activities;Therapeutic exercise;Cognitive remediation/compensation;Neuromuscular education;Functional Mobility Training;Patient/family education;Manual Therapy;Paraffin;Electrical Stimulation    Plan  Check ball exercises in supine with patient and husband, NMR trunk RUE, RUE in transitional movements if feasible, graps / release with low reach    Clinical Decision Making  Several treatment options, min-mod task modification necessary    Consulted and Agree with Plan of Care  Patient;Family member/caregiver    Family Member Consulted  husband       Patient will benefit from skilled therapeutic intervention in order to improve the following deficits and impairments:  Decreased coordination, Decreased range of motion, Difficulty walking, Impaired flexibility, Improper body mechanics, Decreased safety awareness, Increased edema, Impaired sensation, Decreased activity tolerance, Impaired tone, Decreased balance, Decreased knowledge of use of DME, Impaired UE functional use, Decreased cognition, Decreased mobility, Decreased strength, Impaired perceived functional ability  Visit Diagnosis: Spastic hemiplegia of right dominant side as late effect of cerebral infarction (HCC)  Muscle weakness (generalized)  Unsteadiness on feet  Abnormal posture  Stiffness of right hand, not elsewhere classified  Stiffness of right shoulder, not elsewhere classified  Cognitive social or emotional deficit following cerebral  infarction  Other symptoms and signs involving the nervous system    Problem List Patient Active Problem List   Diagnosis Date Noted  . Acute lower UTI   . Labile blood pressure   . Abnormal urinalysis   . Trauma   . Closed compression fracture of L4 lumbar vertebra   . Neurogenic bladder   . History of hypertension   . Anemia of chronic disease   . Hypoalbuminemia due to protein-calorie malnutrition (HCC)   . Acute bilateral cerebral infarction in a watershed distribution (HCC) 04/05/2017  . Benign essential HTN   . Chronic diastolic heart failure (HCC)   . Right spastic hemiparesis (HCC)   . History of stroke 02/16/2017  . Abnormality of gait following cerebrovascular accident (CVA) 02/16/2017  . Slow transit constipation   . Acute blood loss anemia   . Adjustment disorder with depressed mood   . Cerebrovascular accident (CVA) (HCC) 02/03/2016  . Dysarthria, post-stroke   . Dysphagia, post-stroke   . Leukocytosis   . Prediabetes   . Right hemiparesis (HCC)   . Cerebral infarction due to unspecified mechanism   . Other secondary hypertension   . Overactive bladder   . Diastolic dysfunction   . Hyperlipidemia   . Essential hypertension   . Essential tremor   . Stroke (cerebrum) (HCC) 02/01/2016  . Facial droop due to stroke 02/01/2016  . Essential and other specified forms of tremor 02/20/2013  . Dysphonia 02/20/2013  . Trigeminal neuralgia 02/20/2013    Gellert, Kristin M, OTR/L 08/24/2017, 5:21 PM  [facility] [address] [place], [place], [address] Phone: [phone]   Fax:  [phone]  Name: Kelly [name] MRN: [id number] Date of Birth: 07/27/1927 

## 2017-08-25 ENCOUNTER — Encounter: Payer: Self-pay | Admitting: Physical Therapy

## 2017-08-25 NOTE — Therapy (Signed)
[facility] [address] [place], [place], [address] Phone: [phone]   Fax:  [phone]  Physical Therapy Treatment  Patient Details  Name: Kelly [name] MRN: [id number] Date of Birth: 1928-05-04 Referring Provider: Dr Leonie Man   Encounter Date: 08/24/2017  PT End of Session - 08/25/17 1502    Visit Number  8    Number of Visits  17    Date for PT Re-Evaluation  09/24/17    Authorization Type  Medicare/[facility]    Authorization Time Period  07-26-17 - 09-24-17    PT Start Time  1447    PT Stop Time  1531    PT Time Calculation (min)  44 min       Past Medical History:  Diagnosis Date  . Acute blood loss anemia   . Adjustment disorder with depressed mood   . CVA (cerebral vascular accident) ([place]) 02/03/2016   Linear infarct within the posterior limb of left internal capsule  . Diastolic CHF ([place])   . Dysphagia   . Dysphonia 02/20/2013  . Essential and other specified forms of tremor 02/20/2013  . HLD (hyperlipidemia)   . HTN (hypertension)   . OAB (overactive bladder)   . Osteoporosis   . Patient receiving subcutaneous heparin    For DVT prophylaxis 9/17  . Prediabetes   . Psoriasis   . Slow transit constipation   . Stroke (cerebrum) ([place]) 02/01/2016  . Trigeminal neuralgia 02/20/2013    Past Surgical History:  Procedure Laterality Date  . APPENDECTOMY  1940  . bladder tack    . CATARACT EXTRACTION Bilateral   . HEMORRHOID SURGERY  1960  . HUMERUS FRACTURE SURGERY    . LAPAROSCOPIC HYSTERECTOMY  2006  . torn rotator cuff    . WRIST FRACTURE SURGERY  2005   seconday shoulder 2001    There were no vitals filed for this visit.  Subjective Assessment - 08/24/17 1524    Subjective  Pt states she took cold medication last night and slept all night - felt good this morning when she woke up    Patient is accompained by:  Family member    Pertinent History  Rt MCA CVA on 02-03-16:  Cryptogenic CVA on  04-05-17; pt received OP PT at this facility from 06-01-16 - 11-19-16    Patient Stated Goals  "I would like to walk in the park"     Currently in Pain?  No/denies                       [facility] Adult PT Treatment/Exercise - 08/25/17 0001      Transfers   Transfers  Sit to Stand;Stand to Sit    Sit to Stand  4: Min guard    Stand to Sit  4: Min guard    Number of Reps  Other reps (comment) 5    Comments  no UE support used but pt braced legs against mat table upon initial standing      Ambulation/Gait   Ambulation/Gait  Yes    Ambulation/Gait Assistance  4: Min guard    Ambulation/Gait Assistance Details  foot up brace and heel wedge used on RLE    Ambulation Distance (Feet)  350 Feet    Assistive device  Rolling walker    Gait Pattern  Decreased hip/knee flexion - right;Decreased dorsiflexion - right;Decreased weight shift to right;Decreased step length - right    Ambulation Surface  Level;Indoor  Gait velocity  1.50 ft/sec = 21.87 secs    Gait Comments  toe off AFO and heel wedge used in Rt shoe      Pt stood for 2" unsupported for goal assessment - no LOB -  no UE support used   Pt rode Sci[employer] level 1.5 x 5" with UE's and LE's - pt able to hold Rt hand on handle       PT Short Term Goals - 08/24/17 1457      PT SHORT TERM GOAL #1   Title  Pt will perform sit to stand 5 reps from mat without UE support to demo improved LE strength.    Baseline  Pt able to perform without UE support but stabilizes legs against mat table upon initial standing -08-24-17    Status  Partially Met      PT SHORT TERM GOAL #2   Title  Pt will receive consult for custom AFO for RLE.    Baseline  scheduled at [facility] on 07-27-17; not yet received -- 08-24-17    Status  On-going      PT SHORT TERM GOAL #3   Title  Pt will stand for at least 2" without UE support with SBA to increase independence with ADL's.    Baseline  met 08-24-17    Status  Achieved      PT SHORT TERM GOAL #4    Title  Incr. gait velocity to >/= .9 ft/sec with RW for incr. gait efficiency.    Baseline  .62 ft/sec with RW (no AFO used); 1.50 ft/sec with RW - 08-24-17    Status  Achieved      PT SHORT TERM GOAL #5   Title  Pt will amb. with RW 350' with SBA on flat, even surface.    Baseline  met 08-24-17    Status  Achieved        PT Long Term Goals - 07/26/17 2142      PT LONG TERM GOAL #1   Title  Pt will increase gait velocity from .54 ft/sec to >/= 1.2 ft/sec with RW with AFO on RLE for incr. gait efficiency.    Baseline  .45 ft/sec with RW    Time  8    Period  Weeks    Status  New    Target Date  09/24/17      PT LONG TERM GOAL #2   Title  Pt will stand for at least 10" with UE support prn and reach 6" outside BOS without LOB for incr. independence and safety with ADL's.    Time  8    Period  Weeks    Status  New    Target Date  09/24/17      PT LONG TERM GOAL #3   Title  Pt will negotiate 4 steps with Lt hand rail with CGA using a step by step sequence.    Time  8    Period  Weeks    Status  New    Target Date  09/24/17      PT LONG TERM GOAL #4   Title  Independent in HEP for RLE strengthening and balance exercises.    Time  8    Period  Weeks    Status  New    Target Date  09/24/17      PT LONG TERM GOAL #5   Title  Pt will amb. 500' outside with RW with AFO on RLE with SBA  to allow pt to return walking in the park per her stated goal.    Time  8    Period  Weeks    Status  New    Target Date  09/24/17              Patient will benefit from skilled therapeutic intervention in order to improve the following deficits and impairments:     Visit Diagnosis: Other abnormalities of gait and mobility  Unsteadiness on feet  Muscle weakness (generalized)     Problem List Patient Active Problem List   Diagnosis Date Noted  . Acute lower UTI   . Labile blood pressure   . Abnormal urinalysis   . Trauma   . Closed compression fracture of L4 lumbar vertebra    . Neurogenic bladder   . History of hypertension   . Anemia of chronic disease   . Hypoalbuminemia due to protein-calorie malnutrition ([place])   . Acute bilateral cerebral infarction in a watershed distribution [facility]) 04/05/2017  . Benign essential HTN   . Chronic diastolic heart failure ([place])   . Right spastic hemiparesis ([place])   . History of stroke 02/16/2017  . Abnormality of gait following cerebrovascular accident (CVA) 02/16/2017  . Slow transit constipation   . Acute blood loss anemia   . Adjustment disorder with depressed mood   . Cerebrovascular accident (CVA) ([place]) 02/03/2016  . Dysarthria, post-stroke   . Dysphagia, post-stroke   . Leukocytosis   . Prediabetes   . Right hemiparesis ([place])   . Cerebral infarction due to unspecified mechanism   . Other secondary hypertension   . Overactive bladder   . Diastolic dysfunction   . Hyperlipidemia   . Essential hypertension   . Essential tremor   . Stroke (cerebrum) ([place]) 02/01/2016  . Facial droop due to stroke 02/01/2016  . Essential and other specified forms of tremor 02/20/2013  . Dysphonia 02/20/2013  . Trigeminal neuralgia 02/20/2013    [place], PT 08/25/2017, 3:12 PM  [facility] [address] [place], [place], [address] Phone: [phone]   Fax:  [phone]  Name: Kelly [name] MRN: [id number] Date of Birth: [birth year]

## 2017-08-30 ENCOUNTER — Ambulatory Visit: Payer: Medicare Other | Admitting: Physical Therapy

## 2017-08-30 ENCOUNTER — Ambulatory Visit: Payer: Medicare Other | Admitting: Occupational Therapy

## 2017-08-30 ENCOUNTER — Encounter: Payer: Self-pay | Admitting: Occupational Therapy

## 2017-08-30 DIAGNOSIS — M25641 Stiffness of right hand, not elsewhere classified: Secondary | ICD-10-CM

## 2017-08-30 DIAGNOSIS — M6281 Muscle weakness (generalized): Secondary | ICD-10-CM

## 2017-08-30 DIAGNOSIS — I69351 Hemiplegia and hemiparesis following cerebral infarction affecting right dominant side: Secondary | ICD-10-CM

## 2017-08-30 DIAGNOSIS — R2689 Other abnormalities of gait and mobility: Secondary | ICD-10-CM

## 2017-08-30 DIAGNOSIS — R29818 Other symptoms and signs involving the nervous system: Secondary | ICD-10-CM

## 2017-08-30 DIAGNOSIS — R2681 Unsteadiness on feet: Secondary | ICD-10-CM | POA: Diagnosis not present

## 2017-08-30 DIAGNOSIS — R293 Abnormal posture: Secondary | ICD-10-CM

## 2017-08-30 DIAGNOSIS — I69315 Cognitive social or emotional deficit following cerebral infarction: Secondary | ICD-10-CM

## 2017-08-30 DIAGNOSIS — M25611 Stiffness of right shoulder, not elsewhere classified: Secondary | ICD-10-CM | POA: Diagnosis not present

## 2017-08-30 NOTE — Therapy (Signed)
[facility] [address] [place], [place], [address] Phone: [phone]   Fax:  [phone]  Occupational Therapy Treatment  Patient Details  Name: Kelly [name] MRN: [id number] Date of Birth: [birth year] Referring Provider: Dr Leonie Man   Encounter Date: 08/30/2017  OT End of Session - 08/30/17 1619    Visit Number  10    Number of Visits  17    Date for OT Re-Evaluation  09/24/17    Authorization Type  medicare    Authorization Time Period  60 days    OT Start Time  1320    OT Stop Time  1401    OT Time Calculation (min)  41 min    Activity Tolerance  Patient tolerated treatment well       Past Medical History:  Diagnosis Date  . Acute blood loss anemia   . Adjustment disorder with depressed mood   . CVA (cerebral vascular accident) ([place]) 02/03/2016   Linear infarct within the posterior limb of left internal capsule  . Diastolic CHF ([place])   . Dysphagia   . Dysphonia 02/20/2013  . Essential and other specified forms of tremor 02/20/2013  . HLD (hyperlipidemia)   . HTN (hypertension)   . OAB (overactive bladder)   . Osteoporosis   . Patient receiving subcutaneous heparin    For DVT prophylaxis 9/17  . Prediabetes   . Psoriasis   . Slow transit constipation   . Stroke (cerebrum) ([place]) 02/01/2016  . Trigeminal neuralgia 02/20/2013    Past Surgical History:  Procedure Laterality Date  . APPENDECTOMY  1940  . bladder tack    . CATARACT EXTRACTION Bilateral   . HEMORRHOID SURGERY  1960  . HUMERUS FRACTURE SURGERY    . LAPAROSCOPIC HYSTERECTOMY  2006  . torn rotator cuff    . WRIST FRACTURE SURGERY  2005   seconday shoulder 2001    There were no vitals filed for this visit.  Subjective Assessment - 08/30/17 1325    Subjective   I think I am over that fall finally    Patient is accompained by:  Family member husband    Pertinent History  L MCA 01/2016, Multiple bilateral CVA's 03/2017, HTN, CHF    Currently in Pain?  No/denies                   OT Treatments/Exercises (OP) - 08/30/17 0001      Neurological Re-education Exercises   Other Exercises 1  Neuro re ed in supine with emphasis on bilateral chest press and overhead reach in closed chain activity, hand open and relaxed with slow movement. Pt needs cues for achieve full elbow extension and for grading with activity.  Transitioned into sitting and addressed unilateral forward reach in closed chain on moveable surface (UE ranger and then cane).  Upgraded HEP to included forward reach and horizontal abduction in sitting using cane. Pt able to return demonstrate however needs more instruction/practice will review and practice next session and then issue to pt.  Pt agreeable. Also addressed functional ambulation with RW with emphasis on increasing thoracic extension.                OT Short Term Goals - 08/30/17 1617      OT SHORT TERM GOAL #1   Title  Patient will complete a home exercise program designed to improve range of motion in right arm- with min prompting.  Due 08/25/17    Status  Achieved  OT SHORT TERM GOAL #2   Title  Patient will complete a home activity program designed to increase functional use of right UE with min prompting    Status  Achieved      OT SHORT TERM GOAL #3   Title  Patient will stand at counter and remove 2 lightweight items (silverware, lightweight saucer)  from lower rack of dishwasher with close supervision    Status  Achieved      OT [place] #4   Title  Patient will reach forward to to obtain a lightweight item (shirt) from lower surface while seated demonstrating 45 degrees of shoulder flexion with sufficient hand opening and grasp.      Status  Achieved      OT SHORT TERM GOAL #5   Title  Pt will be able to cut food using bilateral upper extremities and min assist    Status  Not Met continue goal      OT SHORT TERM GOAL #6   Title  Patient will bring finger  food to mouth once placed in right hand    Status  Achieved        OT Long Term Goals - 08/30/17 1617      OT LONG TERM GOAL #1   Title  Patient will complete an HEP designed to improve RUE AROM/PROM with mod I due 5/3    Status  On-going      OT LONG TERM GOAL #2   Title  Patient will independently complete a home activity program designed to improve right UE functional ability     Status  On-going      OT LONG TERM GOAL #3   Title  Patient will legibly sign her name with RUE and write a short grocery list    Status  On-going      OT LONG TERM GOAL #4   Title  Patient will load 2-3 lightweight items into dishwasher after each meal- using right upper extremity    Status  On-going      OT LONG TERM GOAL #5   Title  Patient will use RUE to wipe down countertops in kitchen after meals to assist with clean up     Status  On-going      OT LONG TERM GOAL #6   Title  Patient will wash lightweight dishes after meals using BUE's    Status  On-going      OT LONG TERM GOAL #7   Title  Patient will demonstrate ability to use right arm to open cupboard doors and drawers in her home    Status  On-going            Plan - 08/30/17 1617    Clinical Impression Statement  Pt with slow progress toward goals. Pt with some improvement in ability to grade movement with cueing and attention to hand.     Occupational Profile and client history currently impacting functional performance  Wife, mother, fiercely independent prior to CVA's, active in church    Occupational performance deficits (Please refer to evaluation for details):  ADL's;IADL's;Social Participation;Leisure    Rehab Potential  Good    OT Frequency  2x / week    OT Duration  8 weeks    OT Treatment/Interventions  Self-care/ADL training;Moist Heat;Fluidtherapy;DME and/or AE instruction;Splinting;Balance training;Therapeutic activities;Therapeutic exercise;Cognitive remediation/compensation;Neuromuscular education;Functional  Mobility Training;Patient/family education;Manual Therapy;Paraffin;Electrical Stimulation    Plan  review cane exercises in sitting for foward reach and horizontal abduction then issue,  NMR trunk RUE, RUE in transitional movements if feasible, graps / release with low reach    Consulted and Agree with Plan of Care  Patient;Family member/caregiver    Family Member Consulted  husband       Patient will benefit from skilled therapeutic intervention in order to improve the following deficits and impairments:  Decreased coordination, Decreased range of motion, Difficulty walking, Impaired flexibility, Improper body mechanics, Decreased safety awareness, Increased edema, Impaired sensation, Decreased activity tolerance, Impaired tone, Decreased balance, Decreased knowledge of use of DME, Impaired UE functional use, Decreased cognition, Decreased mobility, Decreased strength, Impaired perceived functional ability  Visit Diagnosis: Spastic hemiplegia of right dominant side as late effect of cerebral infarction (HCC)  Unsteadiness on feet  Muscle weakness (generalized)  Abnormal posture  Stiffness of right hand, not elsewhere classified  Stiffness of right shoulder, not elsewhere classified  Cognitive social or emotional deficit following cerebral infarction  Other symptoms and signs involving the nervous system  Other abnormalities of gait and mobility    Problem List Patient Active Problem List   Diagnosis Date Noted  . Acute lower UTI   . Labile blood pressure   . Abnormal urinalysis   . Trauma   . Closed compression fracture of L4 lumbar vertebra   . Neurogenic bladder   . History of hypertension   . Anemia of chronic disease   . Hypoalbuminemia due to protein-calorie malnutrition ([place])   . Acute bilateral cerebral infarction in a watershed distribution [facility]) 04/05/2017  . Benign essential HTN   . Chronic diastolic heart failure ([place])   . Right spastic hemiparesis ([place])   .  History of stroke 02/16/2017  . Abnormality of gait following cerebrovascular accident (CVA) 02/16/2017  . Slow transit constipation   . Acute blood loss anemia   . Adjustment disorder with depressed mood   . Cerebrovascular accident (CVA) ([place]) 02/03/2016  . Dysarthria, post-stroke   . Dysphagia, post-stroke   . Leukocytosis   . Prediabetes   . Right hemiparesis ([place])   . Cerebral infarction due to unspecified mechanism   . Other secondary hypertension   . Overactive bladder   . Diastolic dysfunction   . Hyperlipidemia   . Essential hypertension   . Essential tremor   . Stroke (cerebrum) ([place]) 02/01/2016  . Facial droop due to stroke 02/01/2016  . Essential and other specified forms of tremor 02/20/2013  . Dysphonia 02/20/2013  . Trigeminal neuralgia 02/20/2013    Quay Burow, OTR/L 08/30/2017, 4:21 PM  [facility] [address] [place], [place], [address] Phone: [phone]   Fax:  [phone]  Name: Kelly [name] MRN: [id number] Date of Birth: [birth year]

## 2017-08-31 ENCOUNTER — Encounter: Payer: Self-pay | Admitting: Physical Therapy

## 2017-08-31 NOTE — Therapy (Signed)
[facility] [address] [place], [place], [address] Phone: [phone]   Fax:  [phone]  Physical Therapy Treatment  Patient Details  Name: Kelly [name] MRN: [id number] Date of Birth: Aug 02, 1927 Referring Provider: Dr Leonie Man   Encounter Date: 08/30/2017  PT End of Session - 08/31/17 1911    Visit Number  9    Number of Visits  17    Date for PT Re-Evaluation  09/24/17    Authorization Type  Medicare/[facility]    Authorization Time Period  07-26-17 - 09-24-17    PT Start Time  1403    PT Stop Time  1446    PT Time Calculation (min)  43 min       Past Medical History:  Diagnosis Date  . Acute blood loss anemia   . Adjustment disorder with depressed mood   . CVA (cerebral vascular accident) ([place]) 02/03/2016   Linear infarct within the posterior limb of left internal capsule  . Diastolic CHF ([place])   . Dysphagia   . Dysphonia 02/20/2013  . Essential and other specified forms of tremor 02/20/2013  . HLD (hyperlipidemia)   . HTN (hypertension)   . OAB (overactive bladder)   . Osteoporosis   . Patient receiving subcutaneous heparin    For DVT prophylaxis 9/17  . Prediabetes   . Psoriasis   . Slow transit constipation   . Stroke (cerebrum) ([place]) 02/01/2016  . Trigeminal neuralgia 02/20/2013    Past Surgical History:  Procedure Laterality Date  . APPENDECTOMY  1940  . bladder tack    . CATARACT EXTRACTION Bilateral   . HEMORRHOID SURGERY  1960  . HUMERUS FRACTURE SURGERY    . LAPAROSCOPIC HYSTERECTOMY  2006  . torn rotator cuff    . WRIST FRACTURE SURGERY  2005   seconday shoulder 2001    There were no vitals filed for this visit.  Subjective Assessment - 08/31/17 1905    Subjective  Pt states she had an appt at [facility] tomorrow morning but has to cancel it due to having to go to a funeral - has rescheduled appt at [facility] for next Monday at 10:00    Patient is accompained by:  Family member    Pertinent History  Rt MCA CVA on 02-03-16:  Cryptogenic CVA on 04-05-17; pt received OP PT at this facility from 06-01-16 - 11-19-16    Patient Stated Goals  "I would like to walk in the park"     Currently in Pain?  No/denies                       [facility] Adult PT Treatment/Exercise - 08/31/17 0001      Transfers   Transfers  Sit to Stand;Stand to Sit    Sit to Stand  4: Min guard    Stand to Sit  4: Min guard    Number of Reps  Other reps (comment) 5    Comments  no UE support used but pt braced legs against mat table upon initial standing      Ambulation/Gait   Ambulation/Gait  Yes    Ambulation/Gait Assistance  4: Min guard    Ambulation/Gait Assistance Details  no orthosis used (pt forgot to don foot up brace)    Ambulation Distance (Feet)  250 Feet    Assistive device  Rolling walker    Gait Pattern  Decreased hip/knee flexion - right;Decreased dorsiflexion - right;Decreased weight shift to  right;Decreased step length - right    Ambulation Surface  Level;Indoor      Knee/Hip Exercises: Aerobic   Nustep  Level 3 x 5" with UE's and LE's      Knee/Hip Exercises: Standing   Other Standing Knee Exercises  Pt performed standing Rt hip flexion and abduction with 2# weight on RLE 10 reps each:  stepping backward with RLE with 2# weight on RLE x 10 reps      NeuroRe-ed:  Pt performed standing balance exercise - tap ups to 4" step with UE support with min assist Pt performed supported standing activity - reaching for bean bags (5 on each side - Rt/Lt) with min assist And then tossing in basket placed approx. 5' in front of patient   Pt performed cone taps with each foot with bil. UE support on RW  - 3 cones used for this activity:  Mod assist for balance  While pt lifting leg to touch cone        PT Short Term Goals - 08/24/17 1457      PT SHORT TERM GOAL #1   Title  Pt will perform sit to stand 5 reps from mat without UE support to demo improved LE strength.     Baseline  Pt able to perform without UE support but stabilizes legs against mat table upon initial standing -08-24-17    Status  Partially Met      PT SHORT TERM GOAL #2   Title  Pt will receive consult for custom AFO for RLE.    Baseline  scheduled at [facility] on 07-27-17; not yet received -- 08-24-17    Status  On-going      PT SHORT TERM GOAL #3   Title  Pt will stand for at least 2" without UE support with SBA to increase independence with ADL's.    Baseline  met 08-24-17    Status  Achieved      PT SHORT TERM GOAL #4   Title  Incr. gait velocity to >/= .9 ft/sec with RW for incr. gait efficiency.    Baseline  .64 ft/sec with RW (no AFO used); 1.50 ft/sec with RW - 08-24-17    Status  Achieved      PT SHORT TERM GOAL #5   Title  Pt will amb. with RW 350' with SBA on flat, even surface.    Baseline  met 08-24-17    Status  Achieved        PT Long Term Goals - 07/26/17 2142      PT LONG TERM GOAL #1   Title  Pt will increase gait velocity from .54 ft/sec to >/= 1.2 ft/sec with RW with AFO on RLE for incr. gait efficiency.    Baseline  .17 ft/sec with RW    Time  8    Period  Weeks    Status  New    Target Date  09/24/17      PT LONG TERM GOAL #2   Title  Pt will stand for at least 10" with UE support prn and reach 6" outside BOS without LOB for incr. independence and safety with ADL's.    Time  8    Period  Weeks    Status  New    Target Date  09/24/17      PT LONG TERM GOAL #3   Title  Pt will negotiate 4 steps with Lt hand rail with CGA using a step by step sequence.  Time  8    Period  Weeks    Status  New    Target Date  09/24/17      PT LONG TERM GOAL #4   Title  Independent in HEP for RLE strengthening and balance exercises.    Time  8    Period  Weeks    Status  New    Target Date  09/24/17      PT LONG TERM GOAL #5   Title  Pt will amb. 500' outside with RW with AFO on RLE with SBA to allow pt to return walking in the park per her stated goal.    Time  8     Period  Weeks    Status  New    Target Date  09/24/17            Plan - 08/31/17 1912    Clinical Impression Statement  Pt progressing slowly towards goals; husband reports they were in a hurry and did not remember to put on the Foot up brace before leaving home;  pt continues to amb. with forward flexed posture with difficulty standing erect due to kyphosis     Rehab Potential  Good    Clinical Impairments Affecting Rehab Potential  severity of deficits with h/o prior CVA with Rt hemiparesis    PT Frequency  2x / week    PT Duration  8 weeks    PT Treatment/Interventions  ADLs/Self Care Home Management;Stair training;Gait training;DME Instruction;Therapeutic activities;Therapeutic exercise;Balance training;Neuromuscular re-education;Patient/family education;Orthotic Fit/Training;Passive range of motion       Patient will benefit from skilled therapeutic intervention in order to improve the following deficits and impairments:  Abnormal gait, Decreased endurance, Decreased activity tolerance, Decreased balance, Decreased coordination, Decreased range of motion, Decreased strength, Impaired tone, Impaired UE functional use, Postural dysfunction  Visit Diagnosis: Other abnormalities of gait and mobility  Muscle weakness (generalized)  Unsteadiness on feet     Problem List Patient Active Problem List   Diagnosis Date Noted  . Acute lower UTI   . Labile blood pressure   . Abnormal urinalysis   . Trauma   . Closed compression fracture of L4 lumbar vertebra   . Neurogenic bladder   . History of hypertension   . Anemia of chronic disease   . Hypoalbuminemia due to protein-calorie malnutrition ([place])   . Acute bilateral cerebral infarction in a watershed distribution [facility]) 04/05/2017  . Benign essential HTN   . Chronic diastolic heart failure ([place])   . Right spastic hemiparesis ([place])   . History of stroke 02/16/2017  . Abnormality of gait following cerebrovascular accident  (CVA) 02/16/2017  . Slow transit constipation   . Acute blood loss anemia   . Adjustment disorder with depressed mood   . Cerebrovascular accident (CVA) ([place]) 02/03/2016  . Dysarthria, post-stroke   . Dysphagia, post-stroke   . Leukocytosis   . Prediabetes   . Right hemiparesis ([place])   . Cerebral infarction due to unspecified mechanism   . Other secondary hypertension   . Overactive bladder   . Diastolic dysfunction   . Hyperlipidemia   . Essential hypertension   . Essential tremor   . Stroke (cerebrum) ([place]) 02/01/2016  . Facial droop due to stroke 02/01/2016  . Essential and other specified forms of tremor 02/20/2013  . Dysphonia 02/20/2013  . Trigeminal neuralgia 02/20/2013    [place], PT 08/31/2017, 7:20 PM  [facility] [address]  [place], [place], [address] Phone: [phone]   Fax:  [phone]  Name: LEILA SCHUFF MRN: [id number] Date of Birth: 21-May-1928

## 2017-09-02 ENCOUNTER — Encounter: Payer: Self-pay | Admitting: Occupational Therapy

## 2017-09-02 ENCOUNTER — Ambulatory Visit: Payer: Medicare Other | Admitting: Physical Therapy

## 2017-09-02 ENCOUNTER — Ambulatory Visit: Payer: Medicare Other | Admitting: Occupational Therapy

## 2017-09-02 DIAGNOSIS — M25611 Stiffness of right shoulder, not elsewhere classified: Secondary | ICD-10-CM

## 2017-09-02 DIAGNOSIS — I69315 Cognitive social or emotional deficit following cerebral infarction: Secondary | ICD-10-CM

## 2017-09-02 DIAGNOSIS — M25641 Stiffness of right hand, not elsewhere classified: Secondary | ICD-10-CM

## 2017-09-02 DIAGNOSIS — M6281 Muscle weakness (generalized): Secondary | ICD-10-CM

## 2017-09-02 DIAGNOSIS — R2681 Unsteadiness on feet: Secondary | ICD-10-CM

## 2017-09-02 DIAGNOSIS — I69351 Hemiplegia and hemiparesis following cerebral infarction affecting right dominant side: Secondary | ICD-10-CM | POA: Diagnosis not present

## 2017-09-02 DIAGNOSIS — R2689 Other abnormalities of gait and mobility: Secondary | ICD-10-CM | POA: Diagnosis not present

## 2017-09-02 DIAGNOSIS — R29818 Other symptoms and signs involving the nervous system: Secondary | ICD-10-CM

## 2017-09-02 DIAGNOSIS — R293 Abnormal posture: Secondary | ICD-10-CM

## 2017-09-02 NOTE — Patient Instructions (Signed)
Cane exercises:  Seated on edge of surface- sit as tall as you can, and do not touch your back to the back of the couch or chair.   Repeat each exercise 10 times.  1)  Reach to the side toward the right.  Use your eyes, and turn your head to the right.  10 times/ rest, repeat  2)  Reach forward and backward - with emphasis on straightening elbow as much as possible.  Keep cane tip on the floor on the outside of your right foot.  10 times. Rest, repeat  3)  Make circles clockwise x 10, then counterclockwise x10 .  Make circles as large as possible.  Rest repeat.

## 2017-09-02 NOTE — Therapy (Signed)
[facility] [address] [place], [place], [address] Phone: [phone]   Fax:  [phone]  Occupational Therapy Treatment  Patient Details  Name: Kelly [name] MRN: [id number] Date of Birth: [birth year] Referring Provider: Dr Leonie Man   Encounter Date: 09/02/2017  OT End of Session - 09/02/17 1723    Visit Number  11    Number of Visits  17    Date for OT Re-Evaluation  09/24/17    Authorization Type  medicare    Authorization Time Period  60 days    OT Start Time  1447    OT Stop Time  1530    OT Time Calculation (min)  43 min    Activity Tolerance  Patient tolerated treatment well    Behavior During Therapy  [facility] for tasks assessed/performed       Past Medical History:  Diagnosis Date  . Acute blood loss anemia   . Adjustment disorder with depressed mood   . CVA (cerebral vascular accident) ([place]) 02/03/2016   Linear infarct within the posterior limb of left internal capsule  . Diastolic CHF ([place])   . Dysphagia   . Dysphonia 02/20/2013  . Essential and other specified forms of tremor 02/20/2013  . HLD (hyperlipidemia)   . HTN (hypertension)   . OAB (overactive bladder)   . Osteoporosis   . Patient receiving subcutaneous heparin    For DVT prophylaxis 9/17  . Prediabetes   . Psoriasis   . Slow transit constipation   . Stroke (cerebrum) ([place]) 02/01/2016  . Trigeminal neuralgia 02/20/2013    Past Surgical History:  Procedure Laterality Date  . APPENDECTOMY  1940  . bladder tack    . CATARACT EXTRACTION Bilateral   . HEMORRHOID SURGERY  1960  . HUMERUS FRACTURE SURGERY    . LAPAROSCOPIC HYSTERECTOMY  2006  . torn rotator cuff    . WRIST FRACTURE SURGERY  2005   seconday shoulder 2001    There were no vitals filed for this visit.  Subjective Assessment - 09/02/17 1704    Subjective   I got a ball and a cane so I can do my exercises    Pertinent History  L MCA 01/2016, Multiple bilateral CVA's  03/2017, HTN, CHF    Currently in Pain?  No/denies    Pain Score  0-No pain                   OT Treatments/Exercises (OP) - 09/02/17 0001      Neurological Re-education Exercises   Other Exercises 1  Started session i nsupine to prepare shoulder, and encourage shoulder flexion with elbow extension as needed for reach.  Worked to increase abduction - patient with significant improvement in movement when she looked and even turned her head toward the desired motion.  In sitting worked to upgrade home exercise program for modified closed chain seated exercises to address shoulder elbow coordianted movement on moving supportive surface (cane)  See patient instructions             OT Education - 09/02/17 1722    Education provided  Yes    Education Details  Updated HEP seated exercises for RUE    Person(s) Educated  Patient;Spouse    Methods  Explanation;Demonstration;Handout    Comprehension  Returned demonstration       OT Short Term Goals - 08/30/17 1617      OT SHORT TERM GOAL #1   Title  Patient will  complete a home exercise program designed to improve range of motion in right arm- with min prompting.  Due 08/25/17    Status  Achieved      OT SHORT TERM GOAL #2   Title  Patient will complete a home activity program designed to increase functional use of right UE with min prompting    Status  Achieved      OT SHORT TERM GOAL #3   Title  Patient will stand at counter and remove 2 lightweight items (silverware, lightweight saucer)  from lower rack of dishwasher with close supervision    Status  Achieved      OT SHORT TERM GOAL #4   Title  Patient will reach forward to to obtain a lightweight item (shirt) from lower surface while seated demonstrating 45 degrees of shoulder flexion with sufficient hand opening and grasp.      Status  Achieved      OT SHORT TERM GOAL #5   Title  Pt will be able to cut food using bilateral upper extremities and min assist    Status   Not Met continue goal      OT SHORT TERM GOAL #6   Title  Patient will bring finger food to mouth once placed in right hand    Status  Achieved        OT Long Term Goals - 08/30/17 1617      OT LONG TERM GOAL #1   Title  Patient will complete an HEP designed to improve RUE AROM/PROM with mod I due 5/3    Status  On-going      OT LONG TERM GOAL #2   Title  Patient will independently complete a home activity program designed to improve right UE functional ability     Status  On-going      OT LONG TERM GOAL #3   Title  Patient will legibly sign her name with RUE and write a short grocery list    Status  On-going      OT LONG TERM GOAL #4   Title  Patient will load 2-3 lightweight items into dishwasher after each meal- using right upper extremity    Status  On-going      OT LONG TERM GOAL #5   Title  Patient will use RUE to wipe down countertops in kitchen after meals to assist with clean up     Status  On-going      OT LONG TERM GOAL #6   Title  Patient will wash lightweight dishes after meals using BUE's    Status  On-going      OT LONG TERM GOAL #7   Title  Patient will demonstrate ability to use right arm to open cupboard doors and drawers in her home    Status  On-going            Plan - 09/02/17 1723    Clinical Impression Statement  Patient showing slow but steady improvement with right arm movement, despite significant postural control impairments.      Occupational Profile and client history currently impacting functional performance  Wife, mother, fiercely independent prior to CVA's, active in church    Occupational performance deficits (Please refer to evaluation for details):  ADL's;IADL's;Social Participation;Leisure    Rehab Potential  Good    OT Frequency  2x / week    OT Duration  8 weeks    OT Treatment/Interventions  Self-care/ADL training;Moist Heat;Fluidtherapy;DME and/or AE instruction;Splinting;Balance training;Therapeutic activities;Therapeutic  exercise;Cognitive remediation/compensation;Neuromuscular education;Functional Mobility Training;Patient/family education;Manual Therapy;Paraffin;Electrical Stimulation    Plan  Review long term goals and address prefunctional componenets.  Postural control and NMR rue    Clinical Decision Making  Several treatment options, min-mod task modification necessary    Consulted and Agree with Plan of Care  Patient;Family member/caregiver    Family Member Consulted  husband       Patient will benefit from skilled therapeutic intervention in order to improve the following deficits and impairments:  Decreased coordination, Decreased range of motion, Difficulty walking, Impaired flexibility, Improper body mechanics, Decreased safety awareness, Increased edema, Impaired sensation, Decreased activity tolerance, Impaired tone, Decreased balance, Decreased knowledge of use of DME, Impaired UE functional use, Decreased cognition, Decreased mobility, Decreased strength, Impaired perceived functional ability  Visit Diagnosis: Spastic hemiplegia of right dominant side as late effect of cerebral infarction (HCC)  Unsteadiness on feet  Muscle weakness (generalized)  Abnormal posture  Stiffness of right hand, not elsewhere classified  Stiffness of right shoulder, not elsewhere classified  Cognitive social or emotional deficit following cerebral infarction  Other symptoms and signs involving the nervous system    Problem List Patient Active Problem List   Diagnosis Date Noted  . Acute lower UTI   . Labile blood pressure   . Abnormal urinalysis   . Trauma   . Closed compression fracture of L4 lumbar vertebra   . Neurogenic bladder   . History of hypertension   . Anemia of chronic disease   . Hypoalbuminemia due to protein-calorie malnutrition ([place])   . Acute bilateral cerebral infarction in a watershed distribution [facility]) 04/05/2017  . Benign essential HTN   . Chronic diastolic heart failure ([place])    . Right spastic hemiparesis ([place])   . History of stroke 02/16/2017  . Abnormality of gait following cerebrovascular accident (CVA) 02/16/2017  . Slow transit constipation   . Acute blood loss anemia   . Adjustment disorder with depressed mood   . Cerebrovascular accident (CVA) ([place]) 02/03/2016  . Dysarthria, post-stroke   . Dysphagia, post-stroke   . Leukocytosis   . Prediabetes   . Right hemiparesis ([place])   . Cerebral infarction due to unspecified mechanism   . Other secondary hypertension   . Overactive bladder   . Diastolic dysfunction   . Hyperlipidemia   . Essential hypertension   . Essential tremor   . Stroke (cerebrum) ([place]) 02/01/2016  . Facial droop due to stroke 02/01/2016  . Essential and other specified forms of tremor 02/20/2013  . Dysphonia 02/20/2013  . Trigeminal neuralgia 02/20/2013    Mariah Milling, OTR/L 09/02/2017, 5:26 PM  [facility] [address] [place], [place], [address] Phone: [phone]   Fax:  [phone]  Name: Kelly [name] MRN: [id number] Date of Birth: 21-Jan-1928

## 2017-09-03 ENCOUNTER — Encounter: Payer: Self-pay | Admitting: Physical Therapy

## 2017-09-03 NOTE — Therapy (Signed)
[facility] [address] [place], [place], [address] Phone: [phone]   Fax:  [phone]  Physical Therapy Treatment  Patient Details  Name: Kelly [name] MRN: [id number] Date of Birth: 1927/08/10 Referring Provider: Dr Leonie Man   Encounter Date: 09/02/2017  PT End of Session - 09/03/17 1643    Visit Number  10    Number of Visits  17    Date for PT Re-Evaluation  09/24/17    Authorization Type  Medicare/[facility]    Authorization Time Period  07-26-17 - 09-24-17    PT Start Time  1405    PT Stop Time  1445    PT Time Calculation (min)  40 min       Past Medical History:  Diagnosis Date  . Acute blood loss anemia   . Adjustment disorder with depressed mood   . CVA (cerebral vascular accident) ([place]) 02/03/2016   Linear infarct within the posterior limb of left internal capsule  . Diastolic CHF ([place])   . Dysphagia   . Dysphonia 02/20/2013  . Essential and other specified forms of tremor 02/20/2013  . HLD (hyperlipidemia)   . HTN (hypertension)   . OAB (overactive bladder)   . Osteoporosis   . Patient receiving subcutaneous heparin    For DVT prophylaxis 9/17  . Prediabetes   . Psoriasis   . Slow transit constipation   . Stroke (cerebrum) ([place]) 02/01/2016  . Trigeminal neuralgia 02/20/2013    Past Surgical History:  Procedure Laterality Date  . APPENDECTOMY  1940  . bladder tack    . CATARACT EXTRACTION Bilateral   . HEMORRHOID SURGERY  1960  . HUMERUS FRACTURE SURGERY    . LAPAROSCOPIC HYSTERECTOMY  2006  . torn rotator cuff    . WRIST FRACTURE SURGERY  2005   seconday shoulder 2001    There were no vitals filed for this visit.  Subjective Assessment - 09/03/17 1636    Subjective  Pt wearing foot up brace today - states she has appt on Monday at [facility] to get her brace (AFO) but says she likes what she is wearing so she is probably not going to wear it very much    Patient is accompained by:   Family member    Pertinent History  Rt MCA CVA on 02-03-16:  Cryptogenic CVA on 04-05-17; pt received OP PT at this facility from 06-01-16 - 11-19-16    Patient Stated Goals  "I would like to walk in the park"     Currently in Pain?  No/denies                       [facility] Adult PT Treatment/Exercise - 09/03/17 0001      Transfers   Transfers  Sit to Stand    Sit to Stand  4: Min guard    Stand to Sit  4: Min guard    Number of Reps  Other reps (comment) 3    Comments  no UE support used but pt braced legs against mat table upon initial standing      Ambulation/Gait   Ambulation/Gait  Yes    Ambulation/Gait Assistance  4: Min guard    Ambulation Distance (Feet)  230 Feet    Assistive device  Rolling walker    Gait Pattern  Decreased hip/knee flexion - right;Decreased dorsiflexion - right;Decreased weight shift to right;Decreased step length - right    Ambulation Surface  Level;Indoor  Gait Comments  foot up brace and heel wedge used on RLE      Knee/Hip Exercises: Aerobic   Nustep  Level 3 x 5" with UE's and LE's      Knee/Hip Exercises: Standing   Forward Step Up  Right;1 set;10 reps;Hand Hold: 1;Step Height: 6" Rt knee manually blocked to prevent hyperextension     Other Standing Knee Exercises  Pt performed standing hip flexion and abduction with 2# weight on RLE ; performed stepping backward with @# weight on RLE for hip extension strengthening               PT Short Term Goals - 08/24/17 1457      PT SHORT TERM GOAL #1   Title  Pt will perform sit to stand 5 reps from mat without UE support to demo improved LE strength.    Baseline  Pt able to perform without UE support but stabilizes legs against mat table upon initial standing -08-24-17    Status  Partially Met      PT SHORT TERM GOAL #2   Title  Pt will receive consult for custom AFO for RLE.    Baseline  scheduled at [facility] on 07-27-17; not yet received -- 08-24-17    Status  On-going      PT SHORT  TERM GOAL #3   Title  Pt will stand for at least 2" without UE support with SBA to increase independence with ADL's.    Baseline  met 08-24-17    Status  Achieved      PT SHORT TERM GOAL #4   Title  Incr. gait velocity to >/= .9 ft/sec with RW for incr. gait efficiency.    Baseline  .66 ft/sec with RW (no AFO used); 1.50 ft/sec with RW - 08-24-17    Status  Achieved      PT SHORT TERM GOAL #5   Title  Pt will amb. with RW 350' with SBA on flat, even surface.    Baseline  met 08-24-17    Status  Achieved        PT Long Term Goals - 07/26/17 2142      PT LONG TERM GOAL #1   Title  Pt will increase gait velocity from .54 ft/sec to >/= 1.2 ft/sec with RW with AFO on RLE for incr. gait efficiency.    Baseline  .42 ft/sec with RW    Time  8    Period  Weeks    Status  New    Target Date  09/24/17      PT LONG TERM GOAL #2   Title  Pt will stand for at least 10" with UE support prn and reach 6" outside BOS without LOB for incr. independence and safety with ADL's.    Time  8    Period  Weeks    Status  New    Target Date  09/24/17      PT LONG TERM GOAL #3   Title  Pt will negotiate 4 steps with Lt hand rail with CGA using a step by step sequence.    Time  8    Period  Weeks    Status  New    Target Date  09/24/17      PT LONG TERM GOAL #4   Title  Independent in HEP for RLE strengthening and balance exercises.    Time  8    Period  Weeks    Status  New  Target Date  09/24/17      PT LONG TERM GOAL #5   Title  Pt will amb. 500' outside with RW with AFO on RLE with SBA to allow pt to return walking in the park per her stated goal.    Time  8    Period  Weeks    Status  New    Target Date  09/24/17            Plan - 09/03/17 1644    Clinical Impression Statement  Pt's gait improved today with use of Foot up brace with increased clearance noted in swing phase;  pt continues to have difficulty standing erect due to increased thoracic kyphosis which impacts pt's ability  to perform environmental scanning while ambulating. Pt continues to have Rt knee hyperetension , especially with Rt SLS     Rehab Potential  Good    Clinical Impairments Affecting Rehab Potential  severity of deficits with h/o prior CVA with Rt hemiparesis    PT Frequency  2x / week    PT Duration  8 weeks    PT Treatment/Interventions  ADLs/Self Care Home Management;Stair training;Gait training;DME Instruction;Therapeutic activities;Therapeutic exercise;Balance training;Neuromuscular re-education;Patient/family education;Orthotic Fit/Training;Passive range of motion    PT Next Visit Plan  pt to receive custom AFO from [facility] on Mon., 09-06-17; gait train with RW with AFO; continue standing balance activities, Nustep exercise    PT Home Exercise Plan  see above    Consulted and Agree with Plan of Care  Patient    Family Member Consulted  husband        Patient will benefit from skilled therapeutic intervention in order to improve the following deficits and impairments:  Abnormal gait, Decreased endurance, Decreased activity tolerance, Decreased balance, Decreased coordination, Decreased range of motion, Decreased strength, Impaired tone, Impaired UE functional use, Postural dysfunction  Visit Diagnosis: Other abnormalities of gait and mobility  Muscle weakness (generalized)  Unsteadiness on feet     Problem List Patient Active Problem List   Diagnosis Date Noted  . Acute lower UTI   . Labile blood pressure   . Abnormal urinalysis   . Trauma   . Closed compression fracture of L4 lumbar vertebra   . Neurogenic bladder   . History of hypertension   . Anemia of chronic disease   . Hypoalbuminemia due to protein-calorie malnutrition ([facility])   . Acute bilateral cerebral infarction in a watershed distribution [facility]) 04/05/2017  . Benign essential HTN   . Chronic diastolic heart failure ([place])   . Right spastic hemiparesis ([place])   . History of stroke 02/16/2017  . Abnormality of gait  following cerebrovascular accident (CVA) 02/16/2017  . Slow transit constipation   . Acute blood loss anemia   . Adjustment disorder with depressed mood   . Cerebrovascular accident (CVA) ([place]) 02/03/2016  . Dysarthria, post-stroke   . Dysphagia, post-stroke   . Leukocytosis   . Prediabetes   . Right hemiparesis ([place])   . Cerebral infarction due to unspecified mechanism   . Other secondary hypertension   . Overactive bladder   . Diastolic dysfunction   . Hyperlipidemia   . Essential hypertension   . Essential tremor   . Stroke (cerebrum) ([place]) 02/01/2016  . Facial droop due to stroke 02/01/2016  . Essential and other specified forms of tremor 02/20/2013  . Dysphonia 02/20/2013  . Trigeminal neuralgia 02/20/2013    [place], PT 09/03/2017, 4:55 PM  [facility]  [address] [place], [place], [address] Phone: [phone]   Fax:  [phone]  Name: ANGELIKA JERRETT MRN: [id number] Date of Birth: 1927/08/02

## 2017-09-06 ENCOUNTER — Encounter: Payer: Self-pay | Admitting: Occupational Therapy

## 2017-09-06 ENCOUNTER — Ambulatory Visit: Payer: Medicare Other | Admitting: Occupational Therapy

## 2017-09-06 DIAGNOSIS — R2681 Unsteadiness on feet: Secondary | ICD-10-CM

## 2017-09-06 DIAGNOSIS — M25611 Stiffness of right shoulder, not elsewhere classified: Secondary | ICD-10-CM | POA: Diagnosis not present

## 2017-09-06 DIAGNOSIS — M25641 Stiffness of right hand, not elsewhere classified: Secondary | ICD-10-CM

## 2017-09-06 DIAGNOSIS — I69315 Cognitive social or emotional deficit following cerebral infarction: Secondary | ICD-10-CM

## 2017-09-06 DIAGNOSIS — R2689 Other abnormalities of gait and mobility: Secondary | ICD-10-CM | POA: Diagnosis not present

## 2017-09-06 DIAGNOSIS — M6281 Muscle weakness (generalized): Secondary | ICD-10-CM | POA: Diagnosis not present

## 2017-09-06 DIAGNOSIS — I69351 Hemiplegia and hemiparesis following cerebral infarction affecting right dominant side: Secondary | ICD-10-CM | POA: Diagnosis not present

## 2017-09-06 DIAGNOSIS — R293 Abnormal posture: Secondary | ICD-10-CM

## 2017-09-06 DIAGNOSIS — R29818 Other symptoms and signs involving the nervous system: Secondary | ICD-10-CM

## 2017-09-06 NOTE — Therapy (Signed)
[facility] [place], [place], [address] Phone: [phone]   Fax:  [phone]  Occupational Therapy Treatment  Patient Details  Name: Kelly [name] MRN: [id number] Date of Birth: [birth year] Referring Provider: Dr Leonie Man   Encounter Date: 09/06/2017  OT End of Session - 09/06/17 1651    Visit Number  12    Number of Visits  17    Date for OT Re-Evaluation  09/24/17    Authorization Type  medicare    Authorization Time Period  60 days    OT Start Time  1449    OT Stop Time  1530    OT Time Calculation (min)  41 min    Activity Tolerance  Patient tolerated treatment well       Past Medical History:  Diagnosis Date  . Acute blood loss anemia   . Adjustment disorder with depressed mood   . CVA (cerebral vascular accident) ([place]) 02/03/2016   Linear infarct within the posterior limb of left internal capsule  . Diastolic CHF ([place])   . Dysphagia   . Dysphonia 02/20/2013  . Essential and other specified forms of tremor 02/20/2013  . HLD (hyperlipidemia)   . HTN (hypertension)   . OAB (overactive bladder)   . Osteoporosis   . Patient receiving subcutaneous heparin    For DVT prophylaxis 9/17  . Prediabetes   . Psoriasis   . Slow transit constipation   . Stroke (cerebrum) ([place]) 02/01/2016  . Trigeminal neuralgia 02/20/2013    Past Surgical History:  Procedure Laterality Date  . APPENDECTOMY  1940  . bladder tack    . CATARACT EXTRACTION Bilateral   . HEMORRHOID SURGERY  1960  . HUMERUS FRACTURE SURGERY    . LAPAROSCOPIC HYSTERECTOMY  2006  . torn rotator cuff    . WRIST FRACTURE SURGERY  2005   seconday shoulder 2001    There were no vitals filed for this visit.  Subjective Assessment - 09/06/17 1455    Subjective   This brace hurts (new AFO)    Patient is accompained by:  Family member husband    Pertinent History  L MCA 01/2016, Multiple bilateral CVA's 03/2017, HTN, CHF    Currently in  Pain?  Yes    Pain Score  5     Pain Location  Foot    Pain Orientation  Left    Pain Descriptors / Indicators  Squeezing;Pressure    Pain Type  Acute pain    Pain Onset  Today its from the new brace    Pain Frequency  Intermittent    Aggravating Factors   when I walk with this new brace    Pain Relieving Factors  sitting down, just standing on it doesnt hur as much                   OT Treatments/Exercises (OP) - 09/06/17 0001      ADLs   Writing  Practiced writing with various AE approaches for both UE's. Pt will be more functional writing with LUE therefore goal adjusted.  Pt's writing improved with coban wrap on pencil to practice name with L hand.      ADL Comments  Pt arrived today with new AFO for RLE. Pt c/o signficant pain in R forefoot. Removed brace - no issues with skin however shoe appears very tight despite insole being removed.  Recommended that pt obtain shoe one half size bigger to see if that  would aleviate pain.  Pt also reported that she had worn AFO for 4.5 hours (AFO just issued this morning).  Recommended that pt ramp up tolerance to AFO more slowly starting with 2 hours in am and 2 hours in pm for 3-4 days and then up to 4 hours in am and 4 hours in late afternoon for 3-4 days before attempting to wear all day. Provided in writing and pt and husband verbalized understanding. Pt to try this after she obtains larger shoe.             OT Education - 09/06/17 1648    Education provided  Yes    Education Details  wearing schedule for AFO and writing strategies.    Person(s) Educated  Patient;Spouse    Methods  Explanation;Demonstration;Handout    Comprehension  Verbalized understanding;Returned demonstration       OT Short Term Goals - 09/06/17 1648      OT SHORT TERM GOAL #1   Title  Patient will complete a home exercise program designed to improve range of motion in right arm- with min prompting.  Due 08/25/17    Status  Achieved      OT SHORT  TERM GOAL #2   Title  Patient will complete a home activity program designed to increase functional use of right UE with min prompting    Status  Achieved      OT SHORT TERM GOAL #3   Title  Patient will stand at counter and remove 2 lightweight items (silverware, lightweight saucer)  from lower rack of dishwasher with close supervision    Status  Achieved      OT SHORT TERM GOAL #4   Title  Patient will reach forward to to obtain a lightweight item (shirt) from lower surface while seated demonstrating 45 degrees of shoulder flexion with sufficient hand opening and grasp.      Status  Achieved      OT SHORT TERM GOAL #5   Title  Pt will be able to cut food using bilateral upper extremities and min assist    Status  Not Met continue goal      OT SHORT TERM GOAL #6   Title  Patient will bring finger food to mouth once placed in right hand    Status  Achieved        OT Long Term Goals - 09/06/17 1648      OT LONG TERM GOAL #1   Title  Patient will complete an HEP designed to improve RUE AROM/PROM with mod I due 5/3    Status  On-going      OT LONG TERM GOAL #2   Title  Patient will independently complete a home activity program designed to improve right UE functional ability     Status  On-going      OT LONG TERM GOAL #3   Title  Pt will be able to write name legibly with LUE and AE prn    Status  Revised      OT LONG TERM GOAL #4   Title  Patient will load 2-3 lightweight items into dishwasher after each meal- using right upper extremity    Status  On-going      OT LONG TERM GOAL #5   Title  Patient will use RUE to wipe down countertops in kitchen after meals to assist with clean up     Status  On-going      OT LONG TERM GOAL #6  Title  Patient will wash lightweight dishes after meals using BUE's    Status  On-going      OT LONG TERM GOAL #7   Title  Patient will demonstrate ability to use right arm to open cupboard doors and drawers in her home    Status  On-going             Plan - 09/06/17 1649    Clinical Impression Statement  Pt with slow progress toward goals.  Pt has new RLE AFO.     Occupational Profile and client history currently impacting functional performance  Wife, mother, fiercely independent prior to CVA's, active in church    Occupational performance deficits (Please refer to evaluation for details):  ADL's;IADL's;Social Participation;Leisure    Rehab Potential  Good    OT Frequency  2x / week    OT Duration  8 weeks    OT Treatment/Interventions  Self-care/ADL training;Moist Heat;Fluidtherapy;DME and/or AE instruction;Splinting;Balance training;Therapeutic activities;Therapeutic exercise;Cognitive remediation/compensation;Neuromuscular education;Functional Mobility Training;Patient/family education;Manual Therapy;Paraffin;Electrical Stimulation    Plan  Continue to  long term goals and address prefunctional componenets.  Postural control and NMR rue, balance and transitional movements    Consulted and Agree with Plan of Care  Patient;Family member/caregiver    Family Member Consulted  husband       Patient will benefit from skilled therapeutic intervention in order to improve the following deficits and impairments:  Decreased coordination, Decreased range of motion, Difficulty walking, Impaired flexibility, Improper body mechanics, Decreased safety awareness, Increased edema, Impaired sensation, Decreased activity tolerance, Impaired tone, Decreased balance, Decreased knowledge of use of DME, Impaired UE functional use, Decreased cognition, Decreased mobility, Decreased strength, Impaired perceived functional ability  Visit Diagnosis: Spastic hemiplegia of right dominant side as late effect of cerebral infarction (HCC)  Unsteadiness on feet  Muscle weakness (generalized)  Abnormal posture  Stiffness of right hand, not elsewhere classified  Stiffness of right shoulder, not elsewhere classified  Cognitive social or emotional  deficit following cerebral infarction  Other symptoms and signs involving the nervous system    Problem List Patient Active Problem List   Diagnosis Date Noted  . Acute lower UTI   . Labile blood pressure   . Abnormal urinalysis   . Trauma   . Closed compression fracture of L4 lumbar vertebra   . Neurogenic bladder   . History of hypertension   . Anemia of chronic disease   . Hypoalbuminemia due to protein-calorie malnutrition ([place])   . Acute bilateral cerebral infarction in a watershed distribution [facility]) 04/05/2017  . Benign essential HTN   . Chronic diastolic heart failure ([place])   . Right spastic hemiparesis ([place])   . History of stroke 02/16/2017  . Abnormality of gait following cerebrovascular accident (CVA) 02/16/2017  . Slow transit constipation   . Acute blood loss anemia   . Adjustment disorder with depressed mood   . Cerebrovascular accident (CVA) ([place]) 02/03/2016  . Dysarthria, post-stroke   . Dysphagia, post-stroke   . Leukocytosis   . Prediabetes   . Right hemiparesis ([place])   . Cerebral infarction due to unspecified mechanism   . Other secondary hypertension   . Overactive bladder   . Diastolic dysfunction   . Hyperlipidemia   . Essential hypertension   . Essential tremor   . Stroke (cerebrum) ([place]) 02/01/2016  . Facial droop due to stroke 02/01/2016  . Essential and other specified forms of tremor 02/20/2013  . Dysphonia 02/20/2013  . Trigeminal neuralgia 02/20/2013  Quay Burow , OTR/L 09/06/2017, 4:53 PM  [facility] [address] [place], [place], [address] Phone: [phone]   Fax:  [phone]  Name: Kelly [name] MRN: [id number] Date of Birth: 1928-04-11

## 2017-09-07 ENCOUNTER — Ambulatory Visit: Payer: Medicare Other | Admitting: Occupational Therapy

## 2017-09-07 ENCOUNTER — Ambulatory Visit: Payer: Medicare Other | Admitting: Physical Therapy

## 2017-09-07 ENCOUNTER — Encounter: Payer: Self-pay | Admitting: Physical Therapy

## 2017-09-07 DIAGNOSIS — R2689 Other abnormalities of gait and mobility: Secondary | ICD-10-CM

## 2017-09-07 DIAGNOSIS — M6281 Muscle weakness (generalized): Secondary | ICD-10-CM

## 2017-09-07 DIAGNOSIS — R2681 Unsteadiness on feet: Secondary | ICD-10-CM

## 2017-09-07 DIAGNOSIS — I69351 Hemiplegia and hemiparesis following cerebral infarction affecting right dominant side: Secondary | ICD-10-CM | POA: Diagnosis not present

## 2017-09-07 DIAGNOSIS — M25641 Stiffness of right hand, not elsewhere classified: Secondary | ICD-10-CM

## 2017-09-07 DIAGNOSIS — M25611 Stiffness of right shoulder, not elsewhere classified: Secondary | ICD-10-CM | POA: Diagnosis not present

## 2017-09-07 DIAGNOSIS — R293 Abnormal posture: Secondary | ICD-10-CM

## 2017-09-07 NOTE — Therapy (Signed)
[facility] [address] [place], [place], [address] Phone: [phone]   Fax:  [phone]  Occupational Therapy Treatment  Patient Details  Name: Kelly [name] MRN: [id number] Date of Birth: 09-Oct-1927 Referring Provider: Dr Leonie Man   Encounter Date: 09/07/2017  OT End of Session - 09/07/17 1520    Visit Number  13    Number of Visits  17    Date for OT Re-Evaluation  09/24/17    Authorization Type  medicare    Authorization Time Period  60 days    OT Start Time  1447    OT Stop Time  1530    OT Time Calculation (min)  43 min    Activity Tolerance  Patient tolerated treatment well    Behavior During Therapy  [facility] for tasks assessed/performed       Past Medical History:  Diagnosis Date  . Acute blood loss anemia   . Adjustment disorder with depressed mood   . CVA (cerebral vascular accident) ([place]) 02/03/2016   Linear infarct within the posterior limb of left internal capsule  . Diastolic CHF ([place])   . Dysphagia   . Dysphonia 02/20/2013  . Essential and other specified forms of tremor 02/20/2013  . HLD (hyperlipidemia)   . HTN (hypertension)   . OAB (overactive bladder)   . Osteoporosis   . Patient receiving subcutaneous heparin    For DVT prophylaxis 9/17  . Prediabetes   . Psoriasis   . Slow transit constipation   . Stroke (cerebrum) ([place]) 02/01/2016  . Trigeminal neuralgia 02/20/2013    Past Surgical History:  Procedure Laterality Date  . APPENDECTOMY  1940  . bladder tack    . CATARACT EXTRACTION Bilateral   . HEMORRHOID SURGERY  1960  . HUMERUS FRACTURE SURGERY    . LAPAROSCOPIC HYSTERECTOMY  2006  . torn rotator cuff    . WRIST FRACTURE SURGERY  2005   seconday shoulder 2001    There were no vitals filed for this visit.  Subjective Assessment - 09/07/17 1519    Subjective   Denies pain    Pertinent History  L MCA 01/2016, Multiple bilateral CVA's 03/2017, HTN, CHF    Currently in Pain?   No/denies           Treatment: supine closed chain shoulder flexion and chest press with mod facilitation, then seated AA/ROM with RUE and UE ranger, min-mod facilitation. Standing at tabletop top remove wooden dowel pegs, mod facilitation for RUE, minguard-supervision  for balance, pt fatigued quickly.                OT Education - 09/06/17 1648    Education provided  Yes    Education Details  wearing schedule for AFO and writing strategies.    Person(s) Educated  Patient;Spouse    Methods  Explanation;Demonstration;Handout    Comprehension  Verbalized understanding;Returned demonstration       OT Short Term Goals - 09/06/17 1648      OT SHORT TERM GOAL #1   Title  Patient will complete a home exercise program designed to improve range of motion in right arm- with min prompting.  Due 08/25/17    Status  Achieved      OT SHORT TERM GOAL #2   Title  Patient will complete a home activity program designed to increase functional use of right UE with min prompting    Status  Achieved      OT SHORT TERM GOAL #  3   Title  Patient will stand at counter and remove 2 lightweight items (silverware, Museum/gallery conservator)  from lower rack of dishwasher with close supervision    Status  Achieved      OT SHORT TERM GOAL #4   Title  Patient will reach forward to to obtain a lightweight item (shirt) from lower surface while seated demonstrating 45 degrees of shoulder flexion with sufficient hand opening and grasp.      Status  Achieved      OT SHORT TERM GOAL #5   Title  Pt will be able to cut food using bilateral upper extremities and min assist    Status  Not Met continue goal      OT SHORT TERM GOAL #6   Title  Patient will bring finger food to mouth once placed in right hand    Status  Achieved        OT Long Term Goals - 09/06/17 1648      OT LONG TERM GOAL #1   Title  Patient will complete an HEP designed to improve RUE AROM/PROM with mod I due 5/3    Status   On-going      OT LONG TERM GOAL #2   Title  Patient will independently complete a home activity program designed to improve right UE functional ability     Status  On-going      OT LONG TERM GOAL #3   Title  Pt will be able to write name legibly with LUE and AE prn    Status  Revised      OT LONG TERM GOAL #4   Title  Patient will load 2-3 lightweight items into dishwasher after each meal- using right upper extremity    Status  On-going      OT LONG TERM GOAL #5   Title  Patient will use RUE to wipe down countertops in kitchen after meals to assist with clean up     Status  On-going      OT LONG TERM GOAL #6   Title  Patient will wash lightweight dishes after meals using BUE's    Status  On-going      OT LONG TERM GOAL #7   Title  Patient will demonstrate ability to use right arm to open cupboard doors and drawers in her home    Status  On-going            Plan - 09/07/17 1759    Clinical Impression Statement  Pt is progressing towards goals for RUE functional use and standing balance for ADLs.    Occupational performance deficits (Please refer to evaluation for details):  ADL's;IADL's;Social Participation;Leisure    Rehab Potential  Good    OT Frequency  2x / week    OT Duration  8 weeks    OT Treatment/Interventions  Self-care/ADL training;Moist Heat;Fluidtherapy;DME and/or AE instruction;Splinting;Balance training;Therapeutic activities;Therapeutic exercise;Cognitive remediation/compensation;Neuromuscular education;Functional Mobility Training;Patient/family education;Manual Therapy;Paraffin;Electrical Stimulation    Plan  Continue to  long term goals and address prefunctional componenets.  Postural control and NMR rue, balance and transitional movements    Consulted and Agree with Plan of Care  Patient;Family member/caregiver    Family Member Consulted  husband       Patient will benefit from skilled therapeutic intervention in order to improve the following deficits  and impairments:  Decreased coordination, Decreased range of motion, Difficulty walking, Impaired flexibility, Improper body mechanics, Decreased safety awareness, Increased edema, Impaired sensation, Decreased activity tolerance,  Impaired tone, Decreased balance, Decreased knowledge of use of DME, Impaired UE functional use, Decreased cognition, Decreased mobility, Decreased strength, Impaired perceived functional ability  Visit Diagnosis: Spastic hemiplegia of right dominant side as late effect of cerebral infarction (HCC)  Muscle weakness (generalized)  Abnormal posture  Stiffness of right hand, not elsewhere classified  Stiffness of right shoulder, not elsewhere classified  Unsteadiness on feet    Problem List Patient Active Problem List   Diagnosis Date Noted  . Acute lower UTI   . Labile blood pressure   . Abnormal urinalysis   . Trauma   . Closed compression fracture of L4 lumbar vertebra   . Neurogenic bladder   . History of hypertension   . Anemia of chronic disease   . Hypoalbuminemia due to protein-calorie malnutrition ([place])   . Acute bilateral cerebral infarction in a watershed distribution [facility]) 04/05/2017  . Benign essential HTN   . Chronic diastolic heart failure ([place])   . Right spastic hemiparesis ([place])   . History of stroke 02/16/2017  . Abnormality of gait following cerebrovascular accident (CVA) 02/16/2017  . Slow transit constipation   . Acute blood loss anemia   . Adjustment disorder with depressed mood   . Cerebrovascular accident (CVA) ([place]) 02/03/2016  . Dysarthria, post-stroke   . Dysphagia, post-stroke   . Leukocytosis   . Prediabetes   . Right hemiparesis ([place])   . Cerebral infarction due to unspecified mechanism   . Other secondary hypertension   . Overactive bladder   . Diastolic dysfunction   . Hyperlipidemia   . Essential hypertension   . Essential tremor   . Stroke (cerebrum) ([place]) 02/01/2016  . Facial droop due to stroke 02/01/2016   . Essential and other specified forms of tremor 02/20/2013  . Dysphonia 02/20/2013  . Trigeminal neuralgia 02/20/2013    RINE,KATHRYN 09/07/2017, 6:01 PM  [facility] [address] [place], [place], [address] Phone: [phone]   Fax:  [phone]  Name: Kelly [name] MRN: [id number] Date of Birth: [birth year]

## 2017-09-08 NOTE — Therapy (Signed)
[facility] [address] [place], [place], [address] Phone: [phone]   Fax:  [phone]  Physical Therapy Treatment  Patient Details  Name: Kelly [name] MRN: [id number] Date of Birth: Jul 27, 1927 Referring Provider: Dr Leonie Man   Encounter Date: 09/07/2017  PT End of Session - 09/07/17 1728    Visit Number  11    Number of Visits  17    Date for PT Re-Evaluation  09/24/17    Authorization Type  Medicare/[facility]    Authorization Time Period  07-26-17 - 09-24-17    PT Start Time  1403    PT Stop Time  1445    PT Time Calculation (min)  42 min    Equipment Utilized During Treatment  Gait belt    Activity Tolerance  Patient tolerated treatment well    Behavior During Therapy  [facility] for tasks assessed/performed       Past Medical History:  Diagnosis Date  . Acute blood loss anemia   . Adjustment disorder with depressed mood   . CVA (cerebral vascular accident) ([place]) 02/03/2016   Linear infarct within the posterior limb of left internal capsule  . Diastolic CHF ([place])   . Dysphagia   . Dysphonia 02/20/2013  . Essential and other specified forms of tremor 02/20/2013  . HLD (hyperlipidemia)   . HTN (hypertension)   . OAB (overactive bladder)   . Osteoporosis   . Patient receiving subcutaneous heparin    For DVT prophylaxis 9/17  . Prediabetes   . Psoriasis   . Slow transit constipation   . Stroke (cerebrum) ([place]) 02/01/2016  . Trigeminal neuralgia 02/20/2013    Past Surgical History:  Procedure Laterality Date  . APPENDECTOMY  1940  . bladder tack    . CATARACT EXTRACTION Bilateral   . HEMORRHOID SURGERY  1960  . HUMERUS FRACTURE SURGERY    . LAPAROSCOPIC HYSTERECTOMY  2006  . torn rotator cuff    . WRIST FRACTURE SURGERY  2005   seconday shoulder 2001    There were no vitals filed for this visit.  Subjective Assessment - 09/07/17 1400    Subjective  No falls. She got brace from [employer] yesterday and  got new wider shoes yesterday also. She wore AFO for 2 hours this morning.     Patient is accompained by:  Family member    Pertinent History  Rt MCA CVA on 02-03-16:  Cryptogenic CVA on 04-05-17; pt received OP PT at this facility from 06-01-16 - 11-19-16    Patient Stated Goals  "I would like to walk in the park"     Currently in Pain?  No/denies                       [facility] Adult PT Treatment/Exercise - 09/07/17 1400      Ambulation/Gait   Ambulation/Gait  Yes      Patient arrived ambulating with RW without AFO on limb. She has steppage gait with extreme foot drop on RLE & flexed trunk. Husband & pt reports they were running late. PT donned AFO with verbal cues on technique to ensure heel is seated into AFO. Pt wearing low cut socks and PT recommended socks that cover her skin as high as AFO. Thin dress sock work well. Husband was searching internet while PT worked with pt. PT instructed how to check her skin after AFO wear especially around edges of AFO & bony prominences. Pt & husband verbalized  understanding. PT discussed progressive wear schedule to increase ideally to most of awake hours to enable gait when she wants to move from one area to another. Initially 2 hrs 2x/day and increase by 1 hr  (3hrs 2x/day) every 3 days if no skin issues. Husband & patient verbalized understanding.  Pt ambulated 150' X 2 with RW & AFO with much improved foot clearance, step length and stability. Verbal cues for upright posture. Gait Velocity with RW & AFO 0.90 ft/sec.  Lateral heel wedge under AFO assisted with AFO upright without lean in stance. PT assessed without wedge but patient c/o lateral ankle pain & AFO had lateral lean. PT arranged for orthotist to add lateral heel post after therapy so wedge is shoe not necessary.  PT encouraged increased activity level with high frequency short walks (one room to another in house when she wants something in other room), 4-6 medium walks (in/out home  to car & in/out limited distance community buildings) and 1 long walk/day or 5 days /wk (her maximal tolerable distance with husband following with w/c. PT demonstrated with safe technique to follow with w/c).  Husband and patient verbalized understanding.        PT Education - 09/07/17 1430    Education provided  Yes    Education Details  AFO skin check & wear schedule with goal to wear most of awake hours, increasing activity level / walking program    Person(s) Educated  Patient;Spouse    Methods  Explanation;Verbal cues    Comprehension  Verbalized understanding;Need further instruction       PT Short Term Goals - 08/24/17 1457      PT SHORT TERM GOAL #1   Title  Pt will perform sit to stand 5 reps from mat without UE support to demo improved LE strength.    Baseline  Pt able to perform without UE support but stabilizes legs against mat table upon initial standing -08-24-17    Status  Partially Met      PT SHORT TERM GOAL #2   Title  Pt will receive consult for custom AFO for RLE.    Baseline  scheduled at [facility] on 07-27-17; not yet received -- 08-24-17    Status  On-going      PT SHORT TERM GOAL #3   Title  Pt will stand for at least 2" without UE support with SBA to increase independence with ADL's.    Baseline  met 08-24-17    Status  Achieved      PT SHORT TERM GOAL #4   Title  Incr. gait velocity to >/= .9 ft/sec with RW for incr. gait efficiency.    Baseline  .11 ft/sec with RW (no AFO used); 1.50 ft/sec with RW - 08-24-17    Status  Achieved      PT SHORT TERM GOAL #5   Title  Pt will amb. with RW 350' with SBA on flat, even surface.    Baseline  met 08-24-17    Status  Achieved        PT Long Term Goals - 07/26/17 2142      PT LONG TERM GOAL #1   Title  Pt will increase gait velocity from .54 ft/sec to >/= 1.2 ft/sec with RW with AFO on RLE for incr. gait efficiency.    Baseline  .65 ft/sec with RW    Time  8    Period  Weeks    Status  New    Target  Date   09/24/17      PT LONG TERM GOAL #2   Title  Pt will stand for at least 10" with UE support prn and reach 6" outside BOS without LOB for incr. independence and safety with ADL's.    Time  8    Period  Weeks    Status  New    Target Date  09/24/17      PT LONG TERM GOAL #3   Title  Pt will negotiate 4 steps with Lt hand rail with CGA using a step by step sequence.    Time  8    Period  Weeks    Status  New    Target Date  09/24/17      PT LONG TERM GOAL #4   Title  Independent in HEP for RLE strengthening and balance exercises.    Time  8    Period  Weeks    Status  New    Target Date  09/24/17      PT LONG TERM GOAL #5   Title  Pt will amb. 500' outside with RW with AFO on RLE with SBA to allow pt to return walking in the park per her stated goal.    Time  8    Period  Weeks    Status  New    Target Date  09/24/17            Plan - 09/07/17 1730    Clinical Impression Statement  Patient's gait is much improved with new AFO. Patient verbalizes improvement and reports she thinks she will wear it. Patient needs an external post laterally at heel which was scheduled after therapy today.     Rehab Potential  Good    Clinical Impairments Affecting Rehab Potential  severity of deficits with h/o prior CVA with Rt hemiparesis    PT Frequency  2x / week    PT Duration  8 weeks    PT Treatment/Interventions  ADLs/Self Care Home Management;Stair training;Gait training;DME Instruction;Therapeutic activities;Therapeutic exercise;Balance training;Neuromuscular re-education;Patient/family education;Orthotic Fit/Training;Passive range of motion    PT Next Visit Plan  check wear schedule & skin with AFO; gait train with RW with AFO; continue standing balance activities, Nustep exercise    PT Home Exercise Plan  see above    Consulted and Agree with Plan of Care  Patient    Family Member Consulted  husband        Patient will benefit from skilled therapeutic intervention in order to  improve the following deficits and impairments:  Abnormal gait, Decreased endurance, Decreased activity tolerance, Decreased balance, Decreased coordination, Decreased range of motion, Decreased strength, Impaired tone, Impaired UE functional use, Postural dysfunction  Visit Diagnosis: Unsteadiness on feet  Muscle weakness (generalized)  Abnormal posture  Other abnormalities of gait and mobility     Problem List Patient Active Problem List   Diagnosis Date Noted  . Acute lower UTI   . Labile blood pressure   . Abnormal urinalysis   . Trauma   . Closed compression fracture of L4 lumbar vertebra   . Neurogenic bladder   . History of hypertension   . Anemia of chronic disease   . Hypoalbuminemia due to protein-calorie malnutrition ([place])   . Acute bilateral cerebral infarction in a watershed distribution [facility]) 04/05/2017  . Benign essential HTN   . Chronic diastolic heart failure ([place])   . Right spastic hemiparesis ([place])   . History of stroke 02/16/2017  . Abnormality  of gait following cerebrovascular accident (CVA) 02/16/2017  . Slow transit constipation   . Acute blood loss anemia   . Adjustment disorder with depressed mood   . Cerebrovascular accident (CVA) ([place]) 02/03/2016  . Dysarthria, post-stroke   . Dysphagia, post-stroke   . Leukocytosis   . Prediabetes   . Right hemiparesis ([place])   . Cerebral infarction due to unspecified mechanism   . Other secondary hypertension   . Overactive bladder   . Diastolic dysfunction   . Hyperlipidemia   . Essential hypertension   . Essential tremor   . Stroke (cerebrum) ([place]) 02/01/2016  . Facial droop due to stroke 02/01/2016  . Essential and other specified forms of tremor 02/20/2013  . Dysphonia 02/20/2013  . Trigeminal neuralgia 02/20/2013    Jamey Reas PT, DPT 09/08/2017, 7:34 AM  [facility] [address] [place], [place], [address] Phone: [phone]    Fax:  [phone]  Name: Kelly [name] MRN: [id number] Date of Birth: 05-16-1928

## 2017-09-09 ENCOUNTER — Ambulatory Visit: Payer: Medicare Other | Admitting: Occupational Therapy

## 2017-09-09 ENCOUNTER — Ambulatory Visit: Payer: Medicare Other | Admitting: Physical Therapy

## 2017-09-13 ENCOUNTER — Encounter: Payer: Medicare Other | Admitting: Occupational Therapy

## 2017-09-13 ENCOUNTER — Ambulatory Visit: Payer: Medicare Other | Admitting: Physical Therapy

## 2017-09-14 ENCOUNTER — Encounter: Payer: Self-pay | Admitting: Occupational Therapy

## 2017-09-14 ENCOUNTER — Ambulatory Visit: Payer: Medicare Other | Admitting: Occupational Therapy

## 2017-09-14 ENCOUNTER — Ambulatory Visit: Payer: Medicare Other | Admitting: Physical Therapy

## 2017-09-14 DIAGNOSIS — R2689 Other abnormalities of gait and mobility: Secondary | ICD-10-CM

## 2017-09-14 DIAGNOSIS — M25611 Stiffness of right shoulder, not elsewhere classified: Secondary | ICD-10-CM | POA: Diagnosis not present

## 2017-09-14 DIAGNOSIS — R293 Abnormal posture: Secondary | ICD-10-CM

## 2017-09-14 DIAGNOSIS — M25641 Stiffness of right hand, not elsewhere classified: Secondary | ICD-10-CM

## 2017-09-14 DIAGNOSIS — I69315 Cognitive social or emotional deficit following cerebral infarction: Secondary | ICD-10-CM

## 2017-09-14 DIAGNOSIS — R2681 Unsteadiness on feet: Secondary | ICD-10-CM

## 2017-09-14 DIAGNOSIS — M6281 Muscle weakness (generalized): Secondary | ICD-10-CM

## 2017-09-14 DIAGNOSIS — R29818 Other symptoms and signs involving the nervous system: Secondary | ICD-10-CM

## 2017-09-14 DIAGNOSIS — I69351 Hemiplegia and hemiparesis following cerebral infarction affecting right dominant side: Secondary | ICD-10-CM

## 2017-09-14 NOTE — Therapy (Signed)
[facility] [address] [place], [place], [address] Phone: [phone]   Fax:  [phone]  Occupational Therapy Treatment  Patient Details  Name: Kelly [name] MRN: [id number] Date of Birth: 09/30/1927 Referring Provider: Dr Leonie Man   Encounter Date: 09/14/2017  OT End of Session - 09/14/17 1745    Visit Number  14    Number of Visits  17    Date for OT Re-Evaluation  09/24/17    Authorization Type  medicare    Authorization Time Period  60 days    OT Start Time  1446    OT Stop Time  1530    OT Time Calculation (min)  44 min    Activity Tolerance  Patient tolerated treatment well       Past Medical History:  Diagnosis Date  . Acute blood loss anemia   . Adjustment disorder with depressed mood   . CVA (cerebral vascular accident) ([place]) 02/03/2016   Linear infarct within the posterior limb of left internal capsule  . Diastolic CHF ([place])   . Dysphagia   . Dysphonia 02/20/2013  . Essential and other specified forms of tremor 02/20/2013  . HLD (hyperlipidemia)   . HTN (hypertension)   . OAB (overactive bladder)   . Osteoporosis   . Patient receiving subcutaneous heparin    For DVT prophylaxis 9/17  . Prediabetes   . Psoriasis   . Slow transit constipation   . Stroke (cerebrum) ([place]) 02/01/2016  . Trigeminal neuralgia 02/20/2013    Past Surgical History:  Procedure Laterality Date  . APPENDECTOMY  1940  . bladder tack    . CATARACT EXTRACTION Bilateral   . HEMORRHOID SURGERY  1960  . HUMERUS FRACTURE SURGERY    . LAPAROSCOPIC HYSTERECTOMY  2006  . torn rotator cuff    . WRIST FRACTURE SURGERY  2005   seconday shoulder 2001    There were no vitals filed for this visit.  Subjective Assessment - 09/14/17 1737    Subjective   This brace on my foot hurts    Patient is accompained by:  Family member husband    Pertinent History  L MCA 01/2016, Multiple bilateral CVA's 03/2017, HTN, CHF    Currently in  Pain?  Yes    Pain Score  5     Pain Location  Foot    Pain Orientation  Right    Pain Descriptors / Indicators  Tender;Sore    Pain Type  Acute pain    Pain Onset  In the past 7 days    Pain Frequency  Intermittent    Aggravating Factors   walking with brace    Pain Relieving Factors  when I take the brace off                   OT Treatments/Exercises (OP) - 09/14/17 1739      ADLs   ADL Comments  Husband and pt report that they are having difficulty putting brace/shoe on and off. Tried various methods and easiest method for pt and husband is for brace to be put on first and shoe put on over it.  After practice pt's husband able to return demonstrate with extra time. Will continue to practice.  Pt also c/o of brace hurting her toes right where brace ends.  Provided temporary padding to end of brace and pt stated "That is so much better now I have no pain at all."  Pt able to ambulate  150 feet x2 with brace with padding without pain. Discussed with PT and both of [place] recommneded that pt see orthotist asap and discuss possible padding to end of brace.  Provided pt with small piece of padding used in gym today that is very thin to take with them to appt.               OT Education - 09/14/17 1743    Education provided  Yes    Education Details  donning and doffing shoe/brace       OT Short Term Goals - 09/14/17 1743      OT SHORT TERM GOAL #1   Title  Patient will complete a home exercise program designed to improve range of motion in right arm- with min prompting.  Due 08/25/17    Status  Achieved      OT SHORT TERM GOAL #2   Title  Patient will complete a home activity program designed to increase functional use of right UE with min prompting    Status  Achieved      OT SHORT TERM GOAL #3   Title  Patient will stand at counter and remove 2 lightweight items (silverware, lightweight saucer)  from lower rack of dishwasher with close supervision    Status  Achieved       OT SHORT TERM GOAL #4   Title  Patient will reach forward to to obtain a lightweight item (shirt) from lower surface while seated demonstrating 45 degrees of shoulder flexion with sufficient hand opening and grasp.      Status  Achieved      OT SHORT TERM GOAL #5   Title  Pt will be able to cut food using bilateral upper extremities and min assist    Status  Not Met continue goal      OT SHORT TERM GOAL #6   Title  Patient will bring finger food to mouth once placed in right hand    Status  Achieved        OT Long Term Goals - 09/14/17 1744      OT LONG TERM GOAL #1   Title  Patient will complete an HEP designed to improve RUE AROM/PROM with mod I due 5/3    Status  On-going      OT LONG TERM GOAL #2   Title  Patient will independently complete a home activity program designed to improve right UE functional ability     Status  On-going      OT LONG TERM GOAL #3   Title  Pt will be able to write name legibly with LUE and AE prn    Status  On-going      OT LONG TERM GOAL #4   Title  Patient will load 2-3 lightweight items into dishwasher after each meal- using right upper extremity    Status  On-going      OT LONG TERM GOAL #5   Title  Patient will use RUE to wipe down countertops in kitchen after meals to assist with clean up     Status  On-going      OT LONG TERM GOAL #6   Title  Patient will wash lightweight dishes after meals using BUE's    Status  On-going      OT LONG TERM GOAL #7   Title  Patient will demonstrate ability to use right arm to open cupboard doors and drawers in her home    Status  On-going  Plan - 09/14/17 1744    Clinical Impression Statement  Pt with slow progress toward goals. Pt with improved RLE control and functional balance with new AFO    Occupational Profile and client history currently impacting functional performance  Wife, mother, fiercely independent prior to CVA's, active in church    Occupational performance  deficits (Please refer to evaluation for details):  ADL's;IADL's;Social Participation;Leisure    Rehab Potential  Good    OT Frequency  2x / week    OT Duration  8 weeks    OT Treatment/Interventions  Self-care/ADL training;Moist Heat;Fluidtherapy;DME and/or AE instruction;Splinting;Balance training;Therapeutic activities;Therapeutic exercise;Cognitive remediation/compensation;Neuromuscular education;Functional Mobility Training;Patient/family education;Manual Therapy;Paraffin;Electrical Stimulation    Plan  Continue to  long term goals and address prefunctional componenets.  Postural control and NMR rue, balance and transitional movements    Consulted and Agree with Plan of Care  Patient;Family member/caregiver    Family Member Consulted  husband       Patient will benefit from skilled therapeutic intervention in order to improve the following deficits and impairments:  Decreased coordination, Decreased range of motion, Difficulty walking, Impaired flexibility, Improper body mechanics, Decreased safety awareness, Increased edema, Impaired sensation, Decreased activity tolerance, Impaired tone, Decreased balance, Decreased knowledge of use of DME, Impaired UE functional use, Decreased cognition, Decreased mobility, Decreased strength, Impaired perceived functional ability  Visit Diagnosis: Muscle weakness (generalized)  Abnormal posture  Unsteadiness on feet  Spastic hemiplegia of right dominant side as late effect of cerebral infarction (HCC)  Stiffness of right hand, not elsewhere classified  Stiffness of right shoulder, not elsewhere classified  Other abnormalities of gait and mobility  Cognitive social or emotional deficit following cerebral infarction  Other symptoms and signs involving the nervous system    Problem List Patient Active Problem List   Diagnosis Date Noted  . Acute lower UTI   . Labile blood pressure   . Abnormal urinalysis   . Trauma   . Closed  compression fracture of L4 lumbar vertebra   . Neurogenic bladder   . History of hypertension   . Anemia of chronic disease   . Hypoalbuminemia due to protein-calorie malnutrition ([place])   . Acute bilateral cerebral infarction in a watershed distribution [facility]) 04/05/2017  . Benign essential HTN   . Chronic diastolic heart failure ([place])   . Right spastic hemiparesis ([place])   . History of stroke 02/16/2017  . Abnormality of gait following cerebrovascular accident (CVA) 02/16/2017  . Slow transit constipation   . Acute blood loss anemia   . Adjustment disorder with depressed mood   . Cerebrovascular accident (CVA) ([place]) 02/03/2016  . Dysarthria, post-stroke   . Dysphagia, post-stroke   . Leukocytosis   . Prediabetes   . Right hemiparesis ([place])   . Cerebral infarction due to unspecified mechanism   . Other secondary hypertension   . Overactive bladder   . Diastolic dysfunction   . Hyperlipidemia   . Essential hypertension   . Essential tremor   . Stroke (cerebrum) ([place]) 02/01/2016  . Facial droop due to stroke 02/01/2016  . Essential and other specified forms of tremor 02/20/2013  . Dysphonia 02/20/2013  . Trigeminal neuralgia 02/20/2013    Quay Burow, OTR/L 09/14/2017, 5:46 PM  [facility] [address] [place], [place], [address] Phone: [phone]   Fax:  [phone]  Name: Kelly [name] MRN: [id number] Date of Birth: 1927-11-28

## 2017-09-14 NOTE — Therapy (Signed)
[name] [address] [place], [place], [address] Phone: [phone]   Fax:  [phone]  Physical Therapy Treatment  Patient Details  Name: Kelly [name] MRN: [id number] Date of Birth: 1927-11-12 Referring Provider: Dr Leonie Man   Encounter Date: 09/14/2017  PT End of Session - 09/14/17 1703    Visit Number  12    Number of Visits  17    Date for PT Re-Evaluation  09/24/17    Authorization Type  Medicare/[facility]    Authorization Time Period  07-26-17 - 09-24-17    PT Start Time  1535    PT Stop Time  1615    PT Time Calculation (min)  40 min    Activity Tolerance  Patient tolerated treatment well    Behavior During Therapy  [facility] for tasks assessed/performed       Past Medical History:  Diagnosis Date  . Acute blood loss anemia   . Adjustment disorder with depressed mood   . CVA (cerebral vascular accident) ([place]) 02/03/2016   Linear infarct within the posterior limb of left internal capsule  . Diastolic CHF ([place])   . Dysphagia   . Dysphonia 02/20/2013  . Essential and other specified forms of tremor 02/20/2013  . HLD (hyperlipidemia)   . HTN (hypertension)   . OAB (overactive bladder)   . Osteoporosis   . Patient receiving subcutaneous heparin    For DVT prophylaxis 9/17  . Prediabetes   . Psoriasis   . Slow transit constipation   . Stroke (cerebrum) ([place]) 02/01/2016  . Trigeminal neuralgia 02/20/2013    Past Surgical History:  Procedure Laterality Date  . APPENDECTOMY  1940  . bladder tack    . CATARACT EXTRACTION Bilateral   . HEMORRHOID SURGERY  1960  . HUMERUS FRACTURE SURGERY    . LAPAROSCOPIC HYSTERECTOMY  2006  . torn rotator cuff    . WRIST FRACTURE SURGERY  2005   seconday shoulder 2001    There were no vitals filed for this visit.  Subjective Assessment - 09/14/17 1713    Subjective  No falls. having pain across bottom of rt foot near her toes. Husband has been unable to don her brace  since lateral post was added to bottom of her brace (family member assisted her to don brace Sunday and wore it for 3 hours with tenderness under her toes developing).     Patient is accompained by:  Family member    Pertinent History  Rt MCA CVA on 02-03-16:  Cryptogenic CVA on 04-05-17; pt received OP PT at this facility from 06-01-16 - 11-19-16    Patient Stated Goals  "I would like to walk in the park"     Currently in Pain?  Yes    Pain Location  Foot    Pain Orientation  Distal    Pain Descriptors / Indicators  Tender;Grimacing    Pain Type  Acute pain    Pain Onset  In the past 7 days    Pain Frequency  Intermittent    Aggravating Factors   walking with the brace     Pain Relieving Factors  sitting                       [facility] Adult PT Treatment/Exercise - 09/14/17 1600      Transfers   Transfers  Sit to Stand    Sit to Stand  4: Min guard    Comments  LUE only       Ambulation/Gait   Ambulation/Gait  Yes    Ambulation/Gait Assistance  4: Min guard    Ambulation/Gait Assistance Details  minguard for postural/upright cues; limited distance due to pain/redness across ball of foot where brace ends; pain eliminated by thin terrycloth piece laid over end of brace    Ambulation Distance (Feet)  35 Feet 35    Assistive device  Rolling walker    Gait Pattern  Decreased hip/knee flexion - right;Decreased dorsiflexion - right;Decreased weight shift to right;Decreased step length - right;Trunk flexed;Poor foot clearance - right    Ambulation Surface  Indoor      Self-Care   Self-Care  Other Self-Care Comments    Other Self-Care Comments   Pt reports pain along ball of rt foot where AFO foot plate ends. Inspected her skin with noted area of discoloration/purplish hue with +tenderness. Also noted reddened area at tip of 1st toe and along top of foot which pt also indicated are tender to touch. Inspected brace and the only area that correlates with pressure from brace is at the  ball of her foot. OT had provided thin padding to lay over end of foot plate with pt reporting it eliminated the pain. Consulted with colleague, Kelly [name], PT re: modification needed and she recommended orthotist add a moleskin-like material to extend the end of the foot plate out beyond her toes. We discussed that husband has been having a hard time donning patient's brace (see OT note of today) and does best if he dons the brace and then puts her shoe on. Discussed that material added will have to have some rigidity to it to prevent it from curling under when donning her shoe. Kelly [name] at [facility] to discuss the situation and her recommendation. Kelly [name] was told to call [facility] in the morning to set up a time to go by there to get brace modified.       Knee/Hip Exercises: Aerobic   Nustep  Level 3 x 5" with UE's and LE's      Knee/Hip Exercises: Standing   Hip Flexion  Right;Left;1 set;10 reps;Knee bent 2# ankle weight; focus on rt knee control when LLE steps up             PT Education - 09/14/17 1702    Education Details  Do not advance AFO wear time as pt has not been able to don brace (even with husband's assist) and when she is wearing the brace she has pain across the ball of her foot. Educated on follow-up with orthotist for modifications to reduce rubbing/pain.     Person(s) Educated  Patient;Spouse    Methods  Explanation    Comprehension  Verbalized understanding       PT Short Term Goals - 08/24/17 1457      PT SHORT TERM GOAL #1   Title  Pt will perform sit to stand 5 reps from mat without UE support to demo improved LE strength.    Baseline  Pt able to perform without UE support but stabilizes legs against mat table upon initial standing -08-24-17    Status  Partially Met      PT SHORT TERM GOAL #2   Title  Pt will receive consult for custom AFO for RLE.    Baseline  scheduled at [facility] on 07-27-17; not yet received -- 08-24-17    Status  On-going       PT SHORT TERM GOAL #  3   Title  Pt will stand for at least 2" without UE support with SBA to increase independence with ADL's.    Baseline  met 08-24-17    Status  Achieved      PT SHORT TERM GOAL #4   Title  Incr. gait velocity to >/= .9 ft/sec with RW for incr. gait efficiency.    Baseline  .16 ft/sec with RW (no AFO used); 1.50 ft/sec with RW - 08-24-17    Status  Achieved      PT SHORT TERM GOAL #5   Title  Pt will amb. with RW 350' with SBA on flat, even surface.    Baseline  met 08-24-17    Status  Achieved        PT Long Term Goals - 07/26/17 2142      PT LONG TERM GOAL #1   Title  Pt will increase gait velocity from .54 ft/sec to >/= 1.2 ft/sec with RW with AFO on RLE for incr. gait efficiency.    Baseline  .44 ft/sec with RW    Time  8    Period  Weeks    Status  New    Target Date  09/24/17      PT LONG TERM GOAL #2   Title  Pt will stand for at least 10" with UE support prn and reach 6" outside BOS without LOB for incr. independence and safety with ADL's.    Time  8    Period  Weeks    Status  New    Target Date  09/24/17      PT LONG TERM GOAL #3   Title  Pt will negotiate 4 steps with Lt hand rail with CGA using a step by step sequence.    Time  8    Period  Weeks    Status  New    Target Date  09/24/17      PT LONG TERM GOAL #4   Title  Independent in HEP for RLE strengthening and balance exercises.    Time  8    Period  Weeks    Status  New    Target Date  09/24/17      PT LONG TERM GOAL #5   Title  Pt will amb. 500' outside with RW with AFO on RLE with SBA to allow pt to return walking in the park per her stated goal.    Time  8    Period  Weeks    Status  New    Target Date  09/24/17            Plan - 09/14/17 1706    Clinical Impression Statement  Initial part of session spent assessing painful rt foot related to wear of AFO. Skin inspected, consulted additional PT (Kelly [name]), and recommendations explained to pt, husband, and call placed to  orthotist to discuss issue. Husband is to call and set up a time to take brace in for modification. With thin padding OT had added to brace (during session just prior to PT) pt was able to walk with brace, however chose to limit ambulation during session and focused on LE strengthening. Patient continues to be motivated and can benefit from continued PT.     Rehab Potential  Good    Clinical Impairments Affecting Rehab Potential  severity of deficits with h/o prior CVA with Rt hemiparesis    PT Frequency  2x / week    PT Duration  8 weeks    PT Treatment/Interventions  ADLs/Self Care Home Management;Stair training;Gait training;DME Instruction;Therapeutic activities;Therapeutic exercise;Balance training;Neuromuscular re-education;Patient/family education;Orthotic Fit/Training;Passive range of motion    PT Next Visit Plan  check wear schedule & skin with AFO (Kelly [name] had recommended 2 hrs, twice per day however pt with skin issue last visit and did not want her to wear until brace adjusted); gait train with RW with AFO; continue standing balance activities, Nustep exercise    PT Home Exercise Plan  see above    Consulted and Agree with Plan of Care  Patient    Family Member Consulted  husband        Patient will benefit from skilled therapeutic intervention in order to improve the following deficits and impairments:  Abnormal gait, Decreased endurance, Decreased activity tolerance, Decreased balance, Decreased coordination, Decreased range of motion, Decreased strength, Impaired tone, Impaired UE functional use, Postural dysfunction  Visit Diagnosis: Muscle weakness (generalized)  Abnormal posture  Unsteadiness on feet     Problem List Patient Active Problem List   Diagnosis Date Noted  . Acute lower UTI   . Labile blood pressure   . Abnormal urinalysis   . Trauma   . Closed compression fracture of L4 lumbar vertebra   . Neurogenic bladder   . History of hypertension   . Anemia of  chronic disease   . Hypoalbuminemia due to protein-calorie malnutrition ([place])   . Acute bilateral cerebral infarction in a watershed distribution [facility]) 04/05/2017  . Benign essential HTN   . Chronic diastolic heart failure ([place])   . Right spastic hemiparesis ([place])   . History of stroke 02/16/2017  . Abnormality of gait following cerebrovascular accident (CVA) 02/16/2017  . Slow transit constipation   . Acute blood loss anemia   . Adjustment disorder with depressed mood   . Cerebrovascular accident (CVA) ([place]) 02/03/2016  . Dysarthria, post-stroke   . Dysphagia, post-stroke   . Leukocytosis   . Prediabetes   . Right hemiparesis ([place])   . Cerebral infarction due to unspecified mechanism   . Other secondary hypertension   . Overactive bladder   . Diastolic dysfunction   . Hyperlipidemia   . Essential hypertension   . Essential tremor   . Stroke (cerebrum) ([place]) 02/01/2016  . Facial droop due to stroke 02/01/2016  . Essential and other specified forms of tremor 02/20/2013  . Dysphonia 02/20/2013  . Trigeminal neuralgia 02/20/2013    Kelly [name] , PT 09/14/2017, 5:17 PM  [facility] [address] [place], [place], [address] Phone: [phone]   Fax:  [phone]  Name: JAZZMINE KLEIMAN MRN: [id number] Date of Birth: Feb 28, 1928

## 2017-09-15 ENCOUNTER — Encounter: Payer: Self-pay | Admitting: *Deleted

## 2017-09-15 ENCOUNTER — Ambulatory Visit: Payer: Medicare Other | Admitting: *Deleted

## 2017-09-15 ENCOUNTER — Ambulatory Visit: Payer: Medicare Other | Admitting: Physical Therapy

## 2017-09-15 DIAGNOSIS — I69351 Hemiplegia and hemiparesis following cerebral infarction affecting right dominant side: Secondary | ICD-10-CM | POA: Diagnosis not present

## 2017-09-15 DIAGNOSIS — M6281 Muscle weakness (generalized): Secondary | ICD-10-CM | POA: Diagnosis not present

## 2017-09-15 DIAGNOSIS — R2689 Other abnormalities of gait and mobility: Secondary | ICD-10-CM

## 2017-09-15 DIAGNOSIS — R29818 Other symptoms and signs involving the nervous system: Secondary | ICD-10-CM

## 2017-09-15 DIAGNOSIS — M25611 Stiffness of right shoulder, not elsewhere classified: Secondary | ICD-10-CM

## 2017-09-15 DIAGNOSIS — I639 Cerebral infarction, unspecified: Secondary | ICD-10-CM

## 2017-09-15 DIAGNOSIS — R2681 Unsteadiness on feet: Secondary | ICD-10-CM

## 2017-09-15 DIAGNOSIS — M25641 Stiffness of right hand, not elsewhere classified: Secondary | ICD-10-CM | POA: Diagnosis not present

## 2017-09-15 DIAGNOSIS — I6389 Other cerebral infarction: Secondary | ICD-10-CM

## 2017-09-15 DIAGNOSIS — R278 Other lack of coordination: Secondary | ICD-10-CM

## 2017-09-15 NOTE — Therapy (Signed)
[facility] [address] [place], [place], [address] Phone: [phone]   Fax:  [phone]  Occupational Therapy Treatment  Patient Details  Name: Kelly [name] MRN: [id number] Date of Birth: 03/11/1928 Referring Provider: Dr Sethi   Encounter Date: 09/15/2017  OT End of Session - 09/15/17 1230    Visit Number  15    Number of Visits  17    Date for OT Re-Evaluation  09/24/17    Authorization Type  medicare    Authorization Time Period  60 days    OT Start Time  1100    OT Stop Time  1143    OT Time Calculation (min)  43 min    Activity Tolerance  Patient tolerated treatment well    Behavior During Therapy  WFL for tasks assessed/performed       Past Medical History:  Diagnosis Date  . Acute blood loss anemia   . Adjustment disorder with depressed mood   . CVA (cerebral vascular accident) (HCC) 02/03/2016   Linear infarct within the posterior limb of left internal capsule  . Diastolic CHF (HCC)   . Dysphagia   . Dysphonia 02/20/2013  . Essential and other specified forms of tremor 02/20/2013  . HLD (hyperlipidemia)   . HTN (hypertension)   . OAB (overactive bladder)   . Osteoporosis   . Patient receiving subcutaneous heparin    For DVT prophylaxis 9/17  . Prediabetes   . Psoriasis   . Slow transit constipation   . Stroke (cerebrum) (HCC) 02/01/2016  . Trigeminal neuralgia 02/20/2013    Past Surgical History:  Procedure Laterality Date  . APPENDECTOMY  1940  . bladder tack    . CATARACT EXTRACTION Bilateral   . HEMORRHOID SURGERY  1960  . HUMERUS FRACTURE SURGERY    . LAPAROSCOPIC HYSTERECTOMY  2006  . torn rotator cuff    . WRIST FRACTURE SURGERY  2005   seconday shoulder 2001    There were no vitals filed for this visit.  Subjective Assessment - 09/15/17 1109    Subjective   Pt reports that her "brace is being worked on" due to rubbing.     Patient is accompained by:  Family member  Husband    Pertinent History  L MCA 01/2016, Multiple bilateral CVA's 03/2017, HTN, CHF    Currently in Pain?  No/denies    Pain Score  0-No pain    Multiple Pain Sites  No                   OT Treatments/Exercises (OP) - 09/15/17 0001      ADLs   ADL Comments  Verbally discussed don/doff brace/shoe as previously educated however, pt states that she is not wearing her brace today "We dropped it off to be worked on" there was an area that was rubbing. ?Consider don/doff brace in sitting vs supine with spouse assisting next visit.Standing balance while playing x3 games of Connect Four using left hand to place pieces (rest breaks in between games)  ranging in length of 1min 45 sec to 2min 20 seconds. Focus on standing balance & pt confidence in standing, in preparation for incresaed participation in ADL's. Also focused on signing her name with left non dominant hand as this is a LTG of hers, using built up handle on pencil with coban. Pt reportst hat she is dressing herself except for don/doff brace and shoes.      Neurological Re-education Exercises     Other Information  Gentle P/ROM R UE in diagonal pattern, elbow extension, hand/finger extension. Positioning to encourage decreased tone in various planes while seated x8min      Splinting   Splinting  Replaced 2 velcro pieces on splint on RW for better fit as one piece was rubbing on pt left thumb and the other was "coming off". Velcro adjustments provided good fit and pt was Mod I don/doffing in clinic today.             OT Education - 09/15/17 1229    Education provided  Yes    Education Details  Reviewed splint wear/care and disucssed technique for don/doff brace/shoe at home.    Person(s) Educated  Patient;Spouse    Methods  Explanation    Comprehension  Verbalized understanding       OT Short Term Goals - 09/14/17 1743      OT SHORT TERM GOAL #1   Title  Patient will complete a home exercise program designed to  improve range of motion in right arm- with min prompting.  Due 08/25/17    Status  Achieved      OT SHORT TERM GOAL #2   Title  Patient will complete a home activity program designed to increase functional use of right UE with min prompting    Status  Achieved      OT SHORT TERM GOAL #3   Title  Patient will stand at counter and remove 2 lightweight items (silverware, lightweight saucer)  from lower rack of dishwasher with close supervision    Status  Achieved      OT SHORT TERM GOAL #4   Title  Patient will reach forward to to obtain a lightweight item (shirt) from lower surface while seated demonstrating 45 degrees of shoulder flexion with sufficient hand opening and grasp.      Status  Achieved      OT SHORT TERM GOAL #5   Title  Pt will be able to cut food using bilateral upper extremities and min assist    Status  Not Met continue goal      OT SHORT TERM GOAL #6   Title  Patient will bring finger food to mouth once placed in right hand    Status  Achieved        OT Long Term Goals - 09/14/17 1744      OT LONG TERM GOAL #1   Title  Patient will complete an HEP designed to improve RUE AROM/PROM with mod I due 5/3    Status  On-going      OT LONG TERM GOAL #2   Title  Patient will independently complete a home activity program designed to improve right UE functional ability     Status  On-going      OT LONG TERM GOAL #3   Title  Pt will be able to write name legibly with LUE and AE prn    Status  On-going      OT LONG TERM GOAL #4   Title  Patient will load 2-3 lightweight items into dishwasher after each meal- using right upper extremity    Status  On-going      OT LONG TERM GOAL #5   Title  Patient will use RUE to wipe down countertops in kitchen after meals to assist with clean up     Status  On-going      OT LONG TERM GOAL #6   Title  Patient will wash lightweight   dishes after meals using BUE's    Status  On-going      OT LONG TERM GOAL #7   Title  Patient  will demonstrate ability to use right arm to open cupboard doors and drawers in her home    Status  On-going            Plan - 09/15/17 1231    Clinical Impression Statement  Pt should benefit from further practice of donning/doffing AFP RLE in sitting vs supine with spouse assist as well as functional balance with focus on LTG's.    Occupational Profile and client history currently impacting functional performance  Wife, mother, fiercely independent prior to CVA's, active in church    Occupational performance deficits (Please refer to evaluation for details):  ADL's;IADL's;Social Participation;Leisure    Rehab Potential  Good    OT Frequency  2x / week    OT Duration  8 weeks    OT Treatment/Interventions  Moist Heat;Fluidtherapy;DME and/or AE instruction;Splinting;Balance training;Therapeutic activities;Therapeutic exercise;Cognitive remediation/compensation;Neuromuscular education;Functional Mobility Training;Patient/family education;Manual Therapy;Paraffin;Electrical Stimulation;Self-care/ADL training    Plan  Focus on LTG's, functional standing balance & transitional movements and spouse assist with don/doff AFO RLE in sitting vs supine.    Clinical Decision Making  Several treatment options, min-mod task modification necessary    Consulted and Agree with Plan of Care  Patient;Family member/caregiver    Family Member Consulted  husband       Patient will benefit from skilled therapeutic intervention in order to improve the following deficits and impairments:  Decreased coordination, Decreased range of motion, Difficulty walking, Impaired flexibility, Improper body mechanics, Decreased safety awareness, Increased edema, Impaired sensation, Decreased activity tolerance, Impaired tone, Decreased balance, Decreased knowledge of use of DME, Impaired UE functional use, Decreased cognition, Decreased mobility, Decreased strength, Impaired perceived functional ability  Visit  Diagnosis: Muscle weakness (generalized)  Unsteadiness on feet  Spastic hemiplegia of right dominant side as late effect of cerebral infarction (HCC)  Stiffness of right hand, not elsewhere classified  Stiffness of right shoulder, not elsewhere classified  Other abnormalities of gait and mobility  Other symptoms and signs involving the nervous system  Hemiplegia and hemiparesis following cerebral infarction affecting right dominant side (HCC)  Other lack of coordination  Acute bilateral cerebral infarction in a watershed distribution (HCC)  Cerebrovascular accident (CVA), unspecified mechanism (HCC)    Problem List Patient Active Problem List   Diagnosis Date Noted  . Acute lower UTI   . Labile blood pressure   . Abnormal urinalysis   . Trauma   . Closed compression fracture of L4 lumbar vertebra   . Neurogenic bladder   . History of hypertension   . Anemia of chronic disease   . Hypoalbuminemia due to protein-calorie malnutrition (HCC)   . Acute bilateral cerebral infarction in a watershed distribution (HCC) 04/05/2017  . Benign essential HTN   . Chronic diastolic heart failure (HCC)   . Right spastic hemiparesis (HCC)   . History of stroke 02/16/2017  . Abnormality of gait following cerebrovascular accident (CVA) 02/16/2017  . Slow transit constipation   . Acute blood loss anemia   . Adjustment disorder with depressed mood   . Cerebrovascular accident (CVA) (HCC) 02/03/2016  . Dysarthria, post-stroke   . Dysphagia, post-stroke   . Leukocytosis   . Prediabetes   . Right hemiparesis (HCC)   . Cerebral infarction due to unspecified mechanism   . Other secondary hypertension   . Overactive bladder   . Diastolic dysfunction   .   Hyperlipidemia   . Essential hypertension   . Essential tremor   . Stroke (cerebrum) ([place]) 02/01/2016  . Facial droop due to stroke 02/01/2016  . Essential and other specified forms of tremor 02/20/2013  . Dysphonia 02/20/2013  .  Trigeminal neuralgia 02/20/2013    Almyra Deforest, OTR/L 09/15/2017, 12:36 PM  [facility] [address] [place], [place], [address] Phone: [phone]   Fax:  [phone]  Name: MONICKA CYRAN MRN: [id number] Date of Birth: 17-Feb-1928

## 2017-09-15 NOTE — Therapy (Signed)
[facility] [address] [place], [place], [address] Phone: [phone]   Fax:  [phone]  Physical Therapy Treatment  Patient Details  Name: Kelly [name] MRN: [id number] Date of Birth: [birth year] Referring Provider: Dr Leonie Man   Encounter Date: 09/15/2017  PT End of Session - 09/15/17 1318    Visit Number  13    Number of Visits  17    Date for PT Re-Evaluation  09/24/17    Authorization Type  Medicare/[facility]    Authorization Time Period  07-26-17 - 09-24-17    PT Start Time  1020    PT Stop Time  1100    PT Time Calculation (min)  40 min    Activity Tolerance  Patient tolerated treatment well    Behavior During Therapy  [facility] for tasks assessed/performed       Past Medical History:  Diagnosis Date  . Acute blood loss anemia   . Adjustment disorder with depressed mood   . CVA (cerebral vascular accident) ([place]) 02/03/2016   Linear infarct within the posterior limb of left internal capsule  . Diastolic CHF ([place])   . Dysphagia   . Dysphonia 02/20/2013  . Essential and other specified forms of tremor 02/20/2013  . HLD (hyperlipidemia)   . HTN (hypertension)   . OAB (overactive bladder)   . Osteoporosis   . Patient receiving subcutaneous heparin    For DVT prophylaxis 9/17  . Prediabetes   . Psoriasis   . Slow transit constipation   . Stroke (cerebrum) ([place]) 02/01/2016  . Trigeminal neuralgia 02/20/2013    Past Surgical History:  Procedure Laterality Date  . APPENDECTOMY  1940  . bladder tack    . CATARACT EXTRACTION Bilateral   . HEMORRHOID SURGERY  1960  . HUMERUS FRACTURE SURGERY    . LAPAROSCOPIC HYSTERECTOMY  2006  . torn rotator cuff    . WRIST FRACTURE SURGERY  2005   seconday shoulder 2001    There were no vitals filed for this visit.  Subjective Assessment - 09/15/17 1026    Subjective  Took her brace to the orthotist this morning.     Patient is accompained by:  Family member     Pertinent History  Rt MCA CVA on 02-03-16:  Cryptogenic CVA on 04-05-17; pt received OP PT at this facility from 06-01-16 - 11-19-16    Patient Stated Goals  "I would like to walk in the park"     Currently in Pain?  No/denies    Pain Onset  --                       [facility] Adult PT Treatment/Exercise - 09/15/17 1307      Transfers   Transfers  Sit to Stand;Stand to Sit    Sit to Stand  4: Min assist    Sit to Stand Details (indicate cue type and reason)  vc for forward scoot to edge of mat table (otherwise pt uses posterior leg braced against bed to "lever" herself to standing; assist on each rep for anterior weight shift over base of support; vc ad tactile cues for maintaining rt knee in neutral position as standing fully upright (not hyperextending)    Stand to Sit  4: Min guard    Number of Reps  Other reps (comment) 5    Transfer Cueing  vc on descent for anterior weight shift and slow, controlled descent    Comments  LUE push off mat table vs left knee      Ambulation/Gait   Ambulation/Gait  Yes    Ambulation/Gait Assistance  4: Min guard    Ambulation/Gait Assistance Details  without brace pt with signivicant incr rt knee hyperextension and steppage gait; no loss of balance but close guarding for safety due to foot drop without her brace    Ambulation Distance (Feet)  80 Feet    Assistive device  Rolling walker    Gait Pattern  Decreased hip/knee flexion - right;Decreased dorsiflexion - right;Decreased weight shift to right;Decreased step length - right;Trunk flexed;Poor foot clearance - right;Decreased step length - left;Right steppage;Right foot flat;Right genu recurvatum    Ambulation Surface  Indoor      Knee/Hip Exercises: Standing   Hip Flexion  Stengthening;Right;Left;1 set;10 reps;15 reps RLE 2# ankle weight x 10 reps; LLE 3# x 15 reps    Other Standing Knee Exercises  stood at hospital tray table x 8 minutes while working a puzzle with her lefft hand and  maintainging rt knee in neutral (with tactile cues progressing to min assist as she fatigued); RUE as gross support resting on table      Knee/Hip Exercises: Seated   Long Arc Quad  Strengthening;Both;10 reps RLE 2#; LLE 3#    Hamstring Curl  Strengthening;Right;10 reps green band (likely yellow or red would be better for her)               PT Short Term Goals - 08/24/17 1457      PT SHORT TERM GOAL #1   Title  Pt will perform sit to stand 5 reps from mat without UE support to demo improved LE strength.    Baseline  Pt able to perform without UE support but stabilizes legs against mat table upon initial standing -08-24-17    Status  Partially Met      PT SHORT TERM GOAL #2   Title  Pt will receive consult for custom AFO for RLE.    Baseline  scheduled at [facility] on 07-27-17; not yet received -- 08-24-17    Status  On-going      PT SHORT TERM GOAL #3   Title  Pt will stand for at least 2" without UE support with SBA to increase independence with ADL's.    Baseline  met 08-24-17    Status  Achieved      PT SHORT TERM GOAL #4   Title  Incr. gait velocity to >/= .9 ft/sec with RW for incr. gait efficiency.    Baseline  .28 ft/sec with RW (no AFO used); 1.50 ft/sec with RW - 08-24-17    Status  Achieved      PT SHORT TERM GOAL #5   Title  Pt will amb. with RW 350' with SBA on flat, even surface.    Baseline  met 08-24-17    Status  Achieved        PT Long Term Goals - 07/26/17 2142      PT LONG TERM GOAL #1   Title  Pt will increase gait velocity from .54 ft/sec to >/= 1.2 ft/sec with RW with AFO on RLE for incr. gait efficiency.    Baseline  .65 ft/sec with RW    Time  8    Period  Weeks    Status  New    Target Date  09/24/17      PT LONG TERM GOAL #2   Title  Pt will stand  for at least 10" with UE support prn and reach 6" outside BOS without LOB for incr. independence and safety with ADL's.    Time  8    Period  Weeks    Status  New    Target Date  09/24/17      PT  LONG TERM GOAL #3   Title  Pt will negotiate 4 steps with Lt hand rail with CGA using a step by step sequence.    Time  8    Period  Weeks    Status  New    Target Date  09/24/17      PT LONG TERM GOAL #4   Title  Independent in HEP for RLE strengthening and balance exercises.    Time  8    Period  Weeks    Status  New    Target Date  09/24/17      PT LONG TERM GOAL #5   Title  Pt will amb. 500' outside with RW with AFO on RLE with SBA to allow pt to return walking in the park per her stated goal.    Time  8    Period  Weeks    Status  New    Target Date  09/24/17            Plan - 09/15/17 1319    Clinical Impression Statement  Patient without AFO (left with orthotist for modification) and different shoes. Inspected her skin and very slight redness on top of her foot, tip of 1st toe remains red (chronically inflamed per pt), and no redness/purple color or tenderness on ball of foot). Focused on balance and LE strengthening (especially rt knee control). Patient very motivated and despite fatigue fought to remain standing long enough to finish the puzzle! Patient will continue to benefit from PT.     Rehab Potential  Good    Clinical Impairments Affecting Rehab Potential  severity of deficits with h/o prior CVA with Rt hemiparesis    PT Frequency  2x / week    PT Duration  8 weeks    PT Treatment/Interventions  ADLs/Self Care Home Management;Stair training;Gait training;DME Instruction;Therapeutic activities;Therapeutic exercise;Balance training;Neuromuscular re-education;Patient/family education;Orthotic Fit/Training;Passive range of motion    PT Next Visit Plan  check how modified AFO is working/wearing; check wear schedule & skin with AFO (Robin had recommended 2 hrs, twice per day however pt with skin issue last visit and did not want her to wear until brace adjusted); gait train with RW with AFO; continue standing balance activities, Nustep exercise    PT Home Exercise Plan   see above    Consulted and Agree with Plan of Care  Patient    Family Member Consulted  husband        Patient will benefit from skilled therapeutic intervention in order to improve the following deficits and impairments:  Abnormal gait, Decreased endurance, Decreased activity tolerance, Decreased balance, Decreased coordination, Decreased range of motion, Decreased strength, Impaired tone, Impaired UE functional use, Postural dysfunction  Visit Diagnosis: Muscle weakness (generalized)  Unsteadiness on feet  Other abnormalities of gait and mobility     Problem List Patient Active Problem List   Diagnosis Date Noted  . Acute lower UTI   . Labile blood pressure   . Abnormal urinalysis   . Trauma   . Closed compression fracture of L4 lumbar vertebra   . Neurogenic bladder   . History of hypertension   . Anemia of chronic disease   .  Hypoalbuminemia due to protein-calorie malnutrition ([place])   . Acute bilateral cerebral infarction in a watershed distribution [facility]) 04/05/2017  . Benign essential HTN   . Chronic diastolic heart failure ([place])   . Right spastic hemiparesis ([place])   . History of stroke 02/16/2017  . Abnormality of gait following cerebrovascular accident (CVA) 02/16/2017  . Slow transit constipation   . Acute blood loss anemia   . Adjustment disorder with depressed mood   . Cerebrovascular accident (CVA) ([place]) 02/03/2016  . Dysarthria, post-stroke   . Dysphagia, post-stroke   . Leukocytosis   . Prediabetes   . Right hemiparesis ([place])   . Cerebral infarction due to unspecified mechanism   . Other secondary hypertension   . Overactive bladder   . Diastolic dysfunction   . Hyperlipidemia   . Essential hypertension   . Essential tremor   . Stroke (cerebrum) ([place]) 02/01/2016  . Facial droop due to stroke 02/01/2016  . Essential and other specified forms of tremor 02/20/2013  . Dysphonia 02/20/2013  . Trigeminal neuralgia 02/20/2013    Rexanne Mano,  PT 09/15/2017, 1:25 PM  [facility] [address] [place], [place], [address] Phone: [phone]   Fax:  [phone]  Name: KASSIE KENG MRN: [id number] Date of Birth: 09-12-1927

## 2017-09-21 ENCOUNTER — Ambulatory Visit: Payer: Medicare Other | Admitting: Occupational Therapy

## 2017-09-21 ENCOUNTER — Ambulatory Visit: Payer: Medicare Other | Admitting: Physical Therapy

## 2017-09-21 ENCOUNTER — Encounter: Payer: Self-pay | Admitting: Physical Therapy

## 2017-09-21 DIAGNOSIS — M25641 Stiffness of right hand, not elsewhere classified: Secondary | ICD-10-CM | POA: Diagnosis not present

## 2017-09-21 DIAGNOSIS — I69351 Hemiplegia and hemiparesis following cerebral infarction affecting right dominant side: Secondary | ICD-10-CM

## 2017-09-21 DIAGNOSIS — R2681 Unsteadiness on feet: Secondary | ICD-10-CM

## 2017-09-21 DIAGNOSIS — M6281 Muscle weakness (generalized): Secondary | ICD-10-CM | POA: Diagnosis not present

## 2017-09-21 DIAGNOSIS — M25611 Stiffness of right shoulder, not elsewhere classified: Secondary | ICD-10-CM | POA: Diagnosis not present

## 2017-09-21 DIAGNOSIS — R2689 Other abnormalities of gait and mobility: Secondary | ICD-10-CM

## 2017-09-21 NOTE — Therapy (Signed)
[facility] [address] [place], [place], [address] Phone: [phone]   Fax:  [phone]  Occupational Therapy Treatment  Patient Details  Name: Kelly [name] MRN: [id number] Date of Birth: [birth year] Referring Provider: Dr Leonie Man   Encounter Date: 09/21/2017  OT End of Session - 09/21/17 1939    Visit Number  16    Number of Visits  17    Date for OT Re-Evaluation  09/24/17    Authorization Type  medicare    OT Start Time  1450    OT Stop Time  1534    OT Time Calculation (min)  44 min       Past Medical History:  Diagnosis Date  . Acute blood loss anemia   . Adjustment disorder with depressed mood   . CVA (cerebral vascular accident) ([place]) 02/03/2016   Linear infarct within the posterior limb of left internal capsule  . Diastolic CHF ([place])   . Dysphagia   . Dysphonia 02/20/2013  . Essential and other specified forms of tremor 02/20/2013  . HLD (hyperlipidemia)   . HTN (hypertension)   . OAB (overactive bladder)   . Osteoporosis   . Patient receiving subcutaneous heparin    For DVT prophylaxis 9/17  . Prediabetes   . Psoriasis   . Slow transit constipation   . Stroke (cerebrum) ([place]) 02/01/2016  . Trigeminal neuralgia 02/20/2013    Past Surgical History:  Procedure Laterality Date  . APPENDECTOMY  1940  . bladder tack    . CATARACT EXTRACTION Bilateral   . HEMORRHOID SURGERY  1960  . HUMERUS FRACTURE SURGERY    . LAPAROSCOPIC HYSTERECTOMY  2006  . torn rotator cuff    . WRIST FRACTURE SURGERY  2005   seconday shoulder 2001    There were no vitals filed for this visit.  Subjective Assessment - 09/21/17 1943    Pertinent History  L MCA 01/2016, Multiple bilateral CVA's 03/2017, HTN, CHF    Currently in Pain?  No/denies                 Treatment: supine closed chain shoulder flexion and unilateral RUE shoulder flexion AA/ROM. Therapist started checking progress towards goals.   Standing at table, to wipe tabletop with close supervision using RUE, then pt wrote her name with LUE x 2 legibly, using coban grip. Discussed plans to continue therapy.  Pt was encouraged to use RUE to assist with bathing.            OT Short Term Goals - 09/14/17 1743      OT SHORT TERM GOAL #1   Title  Patient will complete a home exercise program designed to improve range of motion in right arm- with min prompting.  Due 08/25/17    Status  Achieved      OT SHORT TERM GOAL #2   Title  Patient will complete a home activity program designed to increase functional use of right UE with min prompting    Status  Achieved      OT SHORT TERM GOAL #3   Title  Patient will stand at counter and remove 2 lightweight items (silverware, lightweight saucer)  from lower rack of dishwasher with close supervision    Status  Achieved      OT SHORT TERM GOAL #4   Title  Patient will reach forward to to obtain a lightweight item (shirt) from lower surface while seated demonstrating 45 degrees of shoulder flexion with  sufficient hand opening and grasp.      Status  Achieved      OT SHORT TERM GOAL #5   Title  Pt will be able to cut food using bilateral upper extremities and min assist    Status  Not Met continue goal      OT SHORT TERM GOAL #6   Title  Patient will bring finger food to mouth once placed in right hand    Status  Achieved        OT Long Term Goals - 09/21/17 1513      OT [place] #1   Title  Patient will complete an HEP designed to improve RUE AROM/PROM with mod I due 5/3    Status  Achieved      OT LONG TERM GOAL #2   Title  Patient will independently complete a home activity program designed to improve right UE functional ability     Status  On-going      OT LONG TERM GOAL #3   Title  Pt will be able to write name legibly with LUE and AE prn    Status  Achieved      OT LONG TERM GOAL #4   Title  Patient will load 2-3 lightweight items into dishwasher  after each meal- using right upper extremity    Status  On-going      OT LONG TERM GOAL #5   Title  Patient will use RUE to wipe down countertops in kitchen after meals to assist with clean up     Status  Partially Met Pt demonstrates ability to perfrom however she is not perfroming consistently at home.      OT LONG TERM GOAL #6   Title  Patient will wash lightweight dishes after meals using BUE's    Status  On-going Pt has not performed      OT LONG TERM GOAL #7   Title  Patient will demonstrate ability to use right arm to open cupboard doors and drawers in her home    Status  On-going not consistently            Plan - 09/21/17 1940    Clinical Impression Statement  Pt is progressing towards goals, however she requires increased v.c. to use RUE functionally.    Rehab Potential  Good    OT Duration  8 weeks    OT Treatment/Interventions  Moist Heat;Fluidtherapy;DME and/or AE instruction;Splinting;Balance training;Therapeutic activities;Therapeutic exercise;Cognitive remediation/compensation;Neuromuscular education;Functional Mobility Training;Patient/family education;Manual Therapy;Paraffin;Electrical Stimulation;Self-care/ADL training    Plan  Focus on LTG's, functional standing balance & transitional movements and spouse assist with don/doff AFO RLE in sitting vs supine when pt receives    Consulted and Agree with Plan of Care  Patient;Family member/caregiver    Family Member Consulted  husband       Patient will benefit from skilled therapeutic intervention in order to improve the following deficits and impairments:  Decreased coordination, Decreased range of motion, Difficulty walking, Impaired flexibility, Improper body mechanics, Decreased safety awareness, Increased edema, Impaired sensation, Decreased activity tolerance, Impaired tone, Decreased balance, Decreased knowledge of use of DME, Impaired UE functional use, Decreased cognition, Decreased mobility, Decreased  strength, Impaired perceived functional ability  Visit Diagnosis: Muscle weakness (generalized)  Unsteadiness on feet  Spastic hemiplegia of right dominant side as late effect of cerebral infarction (HCC)  Stiffness of right hand, not elsewhere classified    Problem List Patient Active Problem List   Diagnosis Date Noted  .  Acute lower UTI   . Labile blood pressure   . Abnormal urinalysis   . Trauma   . Closed compression fracture of L4 lumbar vertebra   . Neurogenic bladder   . History of hypertension   . Anemia of chronic disease   . Hypoalbuminemia due to protein-calorie malnutrition ([place])   . Acute bilateral cerebral infarction in a watershed distribution [facility]) 04/05/2017  . Benign essential HTN   . Chronic diastolic heart failure ([place])   . Right spastic hemiparesis ([place])   . History of stroke 02/16/2017  . Abnormality of gait following cerebrovascular accident (CVA) 02/16/2017  . Slow transit constipation   . Acute blood loss anemia   . Adjustment disorder with depressed mood   . Cerebrovascular accident (CVA) ([place]) 02/03/2016  . Dysarthria, post-stroke   . Dysphagia, post-stroke   . Leukocytosis   . Prediabetes   . Right hemiparesis ([place])   . Cerebral infarction due to unspecified mechanism   . Other secondary hypertension   . Overactive bladder   . Diastolic dysfunction   . Hyperlipidemia   . Essential hypertension   . Essential tremor   . Stroke (cerebrum) ([place]) 02/01/2016  . Facial droop due to stroke 02/01/2016  . Essential and other specified forms of tremor 02/20/2013  . Dysphonia 02/20/2013  . Trigeminal neuralgia 02/20/2013    Zoie Sarin 09/21/2017, 7:43 PM  [facility] [address] [place], [place], [address] Phone: [phone]   Fax:  [phone]  Name: KANDI BRUSSEAU MRN: [id number] Date of Birth: April 08, 1928

## 2017-09-21 NOTE — Therapy (Signed)
[facility] [address] [place], [place], [address] Phone: [phone]   Fax:  [phone]  Physical Therapy Treatment  Patient Details  Name: Kelly [name] MRN: [id number] Date of Birth: Jul 02, 1927 Referring Provider: Dr Leonie Man   Encounter Date: 09/21/2017  PT End of Session - 09/21/17 2056    Visit Number  14    Number of Visits  17    Date for PT Re-Evaluation  09/24/17    Authorization Type  Medicare/[facility]    Authorization Time Period  07-26-17 - 09-24-17    PT Start Time  1403    PT Stop Time  1446    PT Time Calculation (min)  43 min       Past Medical History:  Diagnosis Date  . Acute blood loss anemia   . Adjustment disorder with depressed mood   . CVA (cerebral vascular accident) ([place]) 02/03/2016   Linear infarct within the posterior limb of left internal capsule  . Diastolic CHF ([place])   . Dysphagia   . Dysphonia 02/20/2013  . Essential and other specified forms of tremor 02/20/2013  . HLD (hyperlipidemia)   . HTN (hypertension)   . OAB (overactive bladder)   . Osteoporosis   . Patient receiving subcutaneous heparin    For DVT prophylaxis 9/17  . Prediabetes   . Psoriasis   . Slow transit constipation   . Stroke (cerebrum) ([place]) 02/01/2016  . Trigeminal neuralgia 02/20/2013    Past Surgical History:  Procedure Laterality Date  . APPENDECTOMY  1940  . bladder tack    . CATARACT EXTRACTION Bilateral   . HEMORRHOID SURGERY  1960  . HUMERUS FRACTURE SURGERY    . LAPAROSCOPIC HYSTERECTOMY  2006  . torn rotator cuff    . WRIST FRACTURE SURGERY  2005   seconday shoulder 2001    There were no vitals filed for this visit.  Subjective Assessment - 09/21/17 2048    Subjective  Pt states she is going back to [facility] tomorrow to see Mortimer Fries to see if he can help with brace (took AFO last week for modification due to foot plate not full length of foot and irritating her foot)    Patient is  accompained by:  Family member    Pertinent History  Rt MCA CVA on 02-03-16:  Cryptogenic CVA on 04-05-17; pt received OP PT at this facility from 06-01-16 - 11-19-16    Patient Stated Goals  "I would like to walk in the park"     Currently in Pain?  No/denies                       [facility] Adult PT Treatment/Exercise - 09/21/17 1423      Transfers   Transfers  Sit to Stand;Stand to Sit    Sit to Stand  4: Min guard    Five time sit to stand comments   Lt foot on balance bubble for incr. RLE weight bearing    Stand to Sit  4: Min guard    Number of Reps  Other reps (comment)      Ambulation/Gait   Ambulation/Gait  Yes    Ambulation/Gait Assistance  4: Min guard    Ambulation/Gait Assistance Details  no AFO used;     Ambulation Distance (Feet)  115 Feet    Assistive device  Rolling walker    Gait Pattern  Decreased hip/knee flexion - right;Decreased dorsiflexion - right;Decreased weight shift  to right;Decreased step length - right    Ambulation Surface  Level;Indoor      Knee/Hip Exercises: Aerobic   Recumbent Bike  SciFit level 1.5 x 5" with UE's and LE's      Knee/Hip Exercises: Standing   Forward Step Up  Right;1 set;10 reps;Step Height: 6"    Other Standing Knee Exercises  Pt performed standing hip flexion and abduction with 2# weight on RLE ; performed stepping backward with @# weight on RLE for hip extension strengthening      Knee/Hip Exercises: Seated   Long Arc Quad  Strengthening;Right;1 set;10 reps;Weights 3#               PT Short Term Goals - 08/24/17 1457      PT SHORT TERM GOAL #1   Title  Pt will perform sit to stand 5 reps from mat without UE support to demo improved LE strength.    Baseline  Pt able to perform without UE support but stabilizes legs against mat table upon initial standing -08-24-17    Status  Partially Met      PT SHORT TERM GOAL #2   Title  Pt will receive consult for custom AFO for RLE.    Baseline  scheduled at [facility] on  07-27-17; not yet received -- 08-24-17    Status  On-going      PT SHORT TERM GOAL #3   Title  Pt will stand for at least 2" without UE support with SBA to increase independence with ADL's.    Baseline  met 08-24-17    Status  Achieved      PT SHORT TERM GOAL #4   Title  Incr. gait velocity to >/= .9 ft/sec with RW for incr. gait efficiency.    Baseline  .58 ft/sec with RW (no AFO used); 1.50 ft/sec with RW - 08-24-17    Status  Achieved      PT SHORT TERM GOAL #5   Title  Pt will amb. with RW 350' with SBA on flat, even surface.    Baseline  met 08-24-17    Status  Achieved        PT Long Term Goals - 07/26/17 2142      PT LONG TERM GOAL #1   Title  Pt will increase gait velocity from .54 ft/sec to >/= 1.2 ft/sec with RW with AFO on RLE for incr. gait efficiency.    Baseline  .51 ft/sec with RW    Time  8    Period  Weeks    Status  New    Target Date  09/24/17      PT LONG TERM GOAL #2   Title  Pt will stand for at least 10" with UE support prn and reach 6" outside BOS without LOB for incr. independence and safety with ADL's.    Time  8    Period  Weeks    Status  New    Target Date  09/24/17      PT LONG TERM GOAL #3   Title  Pt will negotiate 4 steps with Lt hand rail with CGA using a step by step sequence.    Time  8    Period  Weeks    Status  New    Target Date  09/24/17      PT LONG TERM GOAL #4   Title  Independent in HEP for RLE strengthening and balance exercises.    Time  8  Period  Weeks    Status  New    Target Date  09/24/17      PT LONG TERM GOAL #5   Title  Pt will amb. 500' outside with RW with AFO on RLE with SBA to allow pt to return walking in the park per her stated goal.    Time  8    Period  Weeks    Status  New    Target Date  09/24/17            Plan - 09/21/17 2057    Clinical Impression Statement  Pt not wearing AFO on RLE due to brace still at orthotist's office; pt continues to have Rt knee hyperextension with foot drop without  use of orthosis; pt progressing towards goals    Rehab Potential  Good    Clinical Impairments Affecting Rehab Potential  severity of deficits with h/o prior CVA with Rt hemiparesis    PT Frequency  2x / week    PT Duration  8 weeks    PT Treatment/Interventions  ADLs/Self Care Home Management;Stair training;Gait training;DME Instruction;Therapeutic activities;Therapeutic exercise;Balance training;Neuromuscular re-education;Patient/family education;Orthotic Fit/Training;Passive range of motion    PT Next Visit Plan  check LTG's and renew    PT Home Exercise Plan  see above    Consulted and Agree with Plan of Care  Patient    Family Member Consulted  husband        Patient will benefit from skilled therapeutic intervention in order to improve the following deficits and impairments:  Abnormal gait, Decreased endurance, Decreased activity tolerance, Decreased balance, Decreased coordination, Decreased range of motion, Decreased strength, Impaired tone, Impaired UE functional use, Postural dysfunction  Visit Diagnosis: Other abnormalities of gait and mobility  Unsteadiness on feet  Muscle weakness (generalized)     Problem List Patient Active Problem List   Diagnosis Date Noted  . Acute lower UTI   . Labile blood pressure   . Abnormal urinalysis   . Trauma   . Closed compression fracture of L4 lumbar vertebra   . Neurogenic bladder   . History of hypertension   . Anemia of chronic disease   . Hypoalbuminemia due to protein-calorie malnutrition ([place])   . Acute bilateral cerebral infarction in a watershed distribution [facility]) 04/05/2017  . Benign essential HTN   . Chronic diastolic heart failure ([place])   . Right spastic hemiparesis ([place])   . History of stroke 02/16/2017  . Abnormality of gait following cerebrovascular accident (CVA) 02/16/2017  . Slow transit constipation   . Acute blood loss anemia   . Adjustment disorder with depressed mood   . Cerebrovascular accident (CVA)  ([place]) 02/03/2016  . Dysarthria, post-stroke   . Dysphagia, post-stroke   . Leukocytosis   . Prediabetes   . Right hemiparesis ([place])   . Cerebral infarction due to unspecified mechanism   . Other secondary hypertension   . Overactive bladder   . Diastolic dysfunction   . Hyperlipidemia   . Essential hypertension   . Essential tremor   . Stroke (cerebrum) ([place]) 02/01/2016  . Facial droop due to stroke 02/01/2016  . Essential and other specified forms of tremor 02/20/2013  . Dysphonia 02/20/2013  . Trigeminal neuralgia 02/20/2013    Alda Lea, PT 09/21/2017, 9:01 PM  [facility] [address] [place], [place], [address] Phone: [phone]   Fax:  [phone]  Name: SHAYLEA UCCI MRN: [id number] Date of Birth: 11-20-1927

## 2017-09-23 ENCOUNTER — Ambulatory Visit: Payer: Medicare Other | Admitting: Occupational Therapy

## 2017-09-23 ENCOUNTER — Ambulatory Visit: Payer: Medicare Other | Attending: Neurology | Admitting: Physical Therapy

## 2017-09-23 DIAGNOSIS — M25641 Stiffness of right hand, not elsewhere classified: Secondary | ICD-10-CM

## 2017-09-23 DIAGNOSIS — I69351 Hemiplegia and hemiparesis following cerebral infarction affecting right dominant side: Secondary | ICD-10-CM | POA: Insufficient documentation

## 2017-09-23 DIAGNOSIS — M6281 Muscle weakness (generalized): Secondary | ICD-10-CM | POA: Diagnosis not present

## 2017-09-23 DIAGNOSIS — R2681 Unsteadiness on feet: Secondary | ICD-10-CM | POA: Insufficient documentation

## 2017-09-23 DIAGNOSIS — I69315 Cognitive social or emotional deficit following cerebral infarction: Secondary | ICD-10-CM | POA: Insufficient documentation

## 2017-09-23 DIAGNOSIS — R2689 Other abnormalities of gait and mobility: Secondary | ICD-10-CM | POA: Diagnosis not present

## 2017-09-23 DIAGNOSIS — R278 Other lack of coordination: Secondary | ICD-10-CM | POA: Diagnosis not present

## 2017-09-23 DIAGNOSIS — R293 Abnormal posture: Secondary | ICD-10-CM | POA: Insufficient documentation

## 2017-09-23 DIAGNOSIS — M25611 Stiffness of right shoulder, not elsewhere classified: Secondary | ICD-10-CM

## 2017-09-23 DIAGNOSIS — R29818 Other symptoms and signs involving the nervous system: Secondary | ICD-10-CM | POA: Insufficient documentation

## 2017-09-23 NOTE — Therapy (Signed)
[facility] [address] [place], [place], [address] Phone: [phone]   Fax:  [phone]  Occupational Therapy Treatment  Patient Details  Name: Kelly [name] MRN: [id number] Date of Birth: 03/16/1928 Referring Provider: Dr Leonie Man   Encounter Date: 09/23/2017  OT End of Session - 09/23/17 1552    Visit Number  17    Number of Visits  25    Date for OT Re-Evaluation  10/22/17    Authorization Type  medicare    Authorization Time Period  60 days- renewed for 2x week for 4 weeks on 09/23/17, next week will be 1/4 weeks    OT Start Time  1450    OT Stop Time  1528    OT Time Calculation (min)  38 min    Activity Tolerance  Patient tolerated treatment well    Behavior During Therapy  [facility] for tasks assessed/performed       Past Medical History:  Diagnosis Date  . Acute blood loss anemia   . Adjustment disorder with depressed mood   . CVA (cerebral vascular accident) ([place]) 02/03/2016   Linear infarct within the posterior limb of left internal capsule  . Diastolic CHF ([place])   . Dysphagia   . Dysphonia 02/20/2013  . Essential and other specified forms of tremor 02/20/2013  . HLD (hyperlipidemia)   . HTN (hypertension)   . OAB (overactive bladder)   . Osteoporosis   . Patient receiving subcutaneous heparin    For DVT prophylaxis 9/17  . Prediabetes   . Psoriasis   . Slow transit constipation   . Stroke (cerebrum) ([place]) 02/01/2016  . Trigeminal neuralgia 02/20/2013    Past Surgical History:  Procedure Laterality Date  . APPENDECTOMY  1940  . bladder tack    . CATARACT EXTRACTION Bilateral   . HEMORRHOID SURGERY  1960  . HUMERUS FRACTURE SURGERY    . LAPAROSCOPIC HYSTERECTOMY  2006  . torn rotator cuff    . WRIST FRACTURE SURGERY  2005   seconday shoulder 2001    There were no vitals filed for this visit.  Subjective Assessment - 09/23/17 1456    Subjective   Pt reports her brace is fixed now     Pertinent History  L MCA 01/2016, Multiple bilateral CVA's 03/2017, HTN, CHF    Currently in Pain?  No/denies               Treatment: Therapist checked progress towards remaining goals, and discussed with pt updated goals. Pt is in agreement  with continued/ updated goals. Seated edge of mat functional low range reaching with RUE to remove dowel pegs from pegboard, min-mod facilitation, increased time and rest breaks required. Placing pegs into wooden pegboard with RUE mo-max facilitation. Pt was encouraged to start working on feeding herself finger foods with RUE at home. Pt received her RLE brace and she reports it is fitting better.              OT Short Term Goals - 09/14/17 1743      OT SHORT TERM GOAL #1   Title  Patient will complete a home exercise program designed to improve range of motion in right arm- with min prompting.  Due 08/25/17    Status  Achieved      OT SHORT TERM GOAL #2   Title  Patient will complete a home activity program designed to increase functional use of right UE with min prompting    Status  Achieved      OT SHORT TERM GOAL #3   Title  Patient will stand at counter and remove 2 lightweight items (silverware, lightweight saucer)  from lower rack of dishwasher with close supervision    Status  Achieved      OT SHORT TERM GOAL #4   Title  Patient will reach forward to to obtain a lightweight item (shirt) from lower surface while seated demonstrating 45 degrees of shoulder flexion with sufficient hand opening and grasp.      Status  Achieved      OT SHORT TERM GOAL #5   Title  Pt will be able to cut food using bilateral upper extremities and min assist    Status  Not Met continue goal      OT SHORT TERM GOAL #6   Title  Patient will bring finger food to mouth once placed in right hand    Status  Achieved        OT Long Term Goals - 09/23/17 1459      OT LONG TERM GOAL #1   Title  Patient will complete an HEP designed to improve  RUE AROM/PROM with mod I due 5/3    Status  Achieved      OT LONG TERM GOAL #2   Title  Patient will independently complete a home activity program designed to improve right UE functional ability - check 10/22/17    Status  On-going not fully addressed      OT LONG TERM GOAL #3   Title  Pt will be able to write name legibly with LUE and AE prn    Status  Achieved      OT LONG TERM GOAL #4   Title  Patient will load 2-3 lightweight items into dishwasher after each meal- using right upper extremity- check 10/22/17    Status  On-going Pt has not attempted      OT LONG TERM GOAL #5   Title  Patient will use RUE to wipe down countertops in kitchen after meals to assist with clean up     Status  Partially Met      Long Term Additional Goals   Additional Long Term Goals  Yes      OT LONG TERM GOAL #6   Title  Patient will wash lightweight dishes after meals using BUE's    Status  Not Met goal d/c      OT LONG TERM GOAL #7   Title  Patient will demonstrate ability to use right arm to open cupboard doors and drawers in her home check 10/22/17    Status  On-going Pt is not performing consistently      OT LONG TERM GOAL #8   Title  Pt will feed herself finger foods at least 50% of the time with RUE.- check 10/22/17    Time  4    Period  Weeks    Status  New      OT LONG TERM GOAL  #10   Time            Plan - 09/23/17 1554    Clinical Impression Statement  Pt is progressing towards goals, however she did not fully meet all long term goals. Pt's progress was slower than anticipated and impacted by a recent fall. Pt can benefit from continued skilled occupational therapy to continue to address RUE functional use, strength, coordination and functional mobility  in order to maximize pt safety and independence with  ADLs/IADLs.    Occupational Profile and client history currently impacting functional performance  Wife, mother, fiercely independent prior to CVA's, active in church     Occupational performance deficits (Please refer to evaluation for details):  ADL's;IADL's;Social Participation;Leisure    Rehab Potential  Good    OT Frequency  2x / week next week will be week 1/4    OT Duration  4 weeks    OT Treatment/Interventions  Moist Heat;Fluidtherapy;DME and/or AE instruction;Splinting;Balance training;Therapeutic activities;Therapeutic exercise;Cognitive remediation/compensation;Neuromuscular education;Functional Mobility Training;Patient/family education;Manual Therapy;Paraffin;Electrical Stimulation;Self-care/ADL training    Plan  work towards unmet and updated goals, NMR/ functional use of RUE    Consulted and Agree with Plan of Care  Patient;Family member/caregiver       Patient will benefit from skilled therapeutic intervention in order to improve the following deficits and impairments:  Decreased coordination, Decreased range of motion, Difficulty walking, Impaired flexibility, Improper body mechanics, Decreased safety awareness, Increased edema, Impaired sensation, Decreased activity tolerance, Impaired tone, Decreased balance, Decreased knowledge of use of DME, Impaired UE functional use, Decreased cognition, Decreased mobility, Decreased strength, Impaired perceived functional ability  Visit Diagnosis: Muscle weakness (generalized)  Unsteadiness on feet  Spastic hemiplegia of right dominant side as late effect of cerebral infarction (HCC)  Stiffness of right hand, not elsewhere classified  Other abnormalities of gait and mobility  Stiffness of right shoulder, not elsewhere classified  Other symptoms and signs involving the nervous system  Hemiplegia and hemiparesis following cerebral infarction affecting right dominant side [facility])    Problem List Patient Active Problem List   Diagnosis Date Noted  . Acute lower UTI   . Labile blood pressure   . Abnormal urinalysis   . Trauma   . Closed compression fracture of L4 lumbar vertebra   . Neurogenic  bladder   . History of hypertension   . Anemia of chronic disease   . Hypoalbuminemia due to protein-calorie malnutrition ([place])   . Acute bilateral cerebral infarction in a watershed distribution [facility]) 04/05/2017  . Benign essential HTN   . Chronic diastolic heart failure ([place])   . Right spastic hemiparesis ([place])   . History of stroke 02/16/2017  . Abnormality of gait following cerebrovascular accident (CVA) 02/16/2017  . Slow transit constipation   . Acute blood loss anemia   . Adjustment disorder with depressed mood   . Cerebrovascular accident (CVA) ([place]) 02/03/2016  . Dysarthria, post-stroke   . Dysphagia, post-stroke   . Leukocytosis   . Prediabetes   . Right hemiparesis ([place])   . Cerebral infarction due to unspecified mechanism   . Other secondary hypertension   . Overactive bladder   . Diastolic dysfunction   . Hyperlipidemia   . Essential hypertension   . Essential tremor   . Stroke (cerebrum) ([place]) 02/01/2016  . Facial droop due to stroke 02/01/2016  . Essential and other specified forms of tremor 02/20/2013  . Dysphonia 02/20/2013  . Trigeminal neuralgia 02/20/2013    RINE,KATHRYN 09/23/2017, 4:06 PM Theone Murdoch, OTR/L [phone] Phone: [phone] 4:06 PM 09/23/17 [facility] [address] [place], [place], [address] Phone: [phone]   Fax:  [phone]  Name: AMBRIELLA KITT MRN: [id number] Date of Birth: 11/05/1927

## 2017-09-24 ENCOUNTER — Encounter: Payer: Self-pay | Admitting: Physical Therapy

## 2017-09-24 NOTE — Therapy (Signed)
[facility] [address] [place], [place], [address] Phone: [phone]   Fax:  [phone]  Physical Therapy Treatment  Patient Details  Name: Kelly [name] MRN: [id number] Date of Birth: 15-Oct-1927 Referring Provider: Dr Leonie Man   Encounter Date: 09/23/2017  PT End of Session - 09/24/17 1628    Visit Number  15    Number of Visits  17    Authorization Type  Medicare/[facility]    Authorization Time Period  07-26-17 - 09-24-17; 09-24-17 - 11-24-17    PT Start Time  1402    PT Stop Time  1446    PT Time Calculation (min)  44 min       Past Medical History:  Diagnosis Date  . Acute blood loss anemia   . Adjustment disorder with depressed mood   . CVA (cerebral vascular accident) ([place]) 02/03/2016   Linear infarct within the posterior limb of left internal capsule  . Diastolic CHF ([place])   . Dysphagia   . Dysphonia 02/20/2013  . Essential and other specified forms of tremor 02/20/2013  . HLD (hyperlipidemia)   . HTN (hypertension)   . OAB (overactive bladder)   . Osteoporosis   . Patient receiving subcutaneous heparin    For DVT prophylaxis 9/17  . Prediabetes   . Psoriasis   . Slow transit constipation   . Stroke (cerebrum) ([place]) 02/01/2016  . Trigeminal neuralgia 02/20/2013    Past Surgical History:  Procedure Laterality Date  . APPENDECTOMY  1940  . bladder tack    . CATARACT EXTRACTION Bilateral   . HEMORRHOID SURGERY  1960  . HUMERUS FRACTURE SURGERY    . LAPAROSCOPIC HYSTERECTOMY  2006  . torn rotator cuff    . WRIST FRACTURE SURGERY  2005   seconday shoulder 2001    There were no vitals filed for this visit.  Subjective Assessment - 09/24/17 1616    Subjective  Pt states she received the brace (Bobby at [employer] modified the foot plate) and he trimmed the top of her shoe so that AFO fits easily inside shoe and it is easy to get her foot with brace on in the shoe    Patient is accompained by:  Family  member    Pertinent History  Rt MCA CVA on 02-03-16:  Cryptogenic CVA on 04-05-17; pt received OP PT at this facility from 06-01-16 - 11-19-16    Patient Stated Goals  "I would like to walk in the park"     Currently in Pain?  No/denies                       [facility] Adult PT Treatment/Exercise - 09/24/17 0001      Transfers   Transfers  Sit to Stand;Stand to Sit    Sit to Stand  4: Min guard    Stand to Sit  4: Min guard    Number of Reps  10 reps    Comments  no UE support used      Ambulation/Gait   Ambulation/Gait  Yes    Ambulation/Gait Assistance  5: Supervision    Ambulation/Gait Assistance Details  AFO on RLE    Ambulation Distance (Feet)  230 Feet 260    Assistive device  Rolling walker    Gait Pattern  Decreased hip/knee flexion - right;Decreased dorsiflexion - right;Decreased weight shift to right;Decreased step length - right    Ambulation Surface  Level;Indoor  High Level Balance   High Level Balance Activities  Side stepping 2 reps inside // bars      Knee/Hip Exercises: Aerobic   Recumbent Bike  SciFit level 1.5 x 5" with UE's and LE's      Knee/Hip Exercises: Standing   Hip Flexion  Stengthening;AROM;Right;1 set;10 reps    Hip Abduction  AROM;Right;1 set;10 reps    Hip Extension  AROM;Right;1 set;10 reps    Forward Step Up  Right;1 set;10 reps;Step Height: 6";Hand Hold: 1      gait velocity = 31.16 secs with RW with AFO on RLE:  1.05 ft/sec with RW   Pt amb. Total distance of 72' with RW - rest break after 230'        PT Short Term Goals - 08/24/17 1457      PT SHORT TERM GOAL #1   Title  Pt will perform sit to stand 5 reps from mat without UE support to demo improved LE strength.    Baseline  Pt able to perform without UE support but stabilizes legs against mat table upon initial standing -08-24-17    Status  Partially Met      PT SHORT TERM GOAL #2   Title  Pt will receive consult for custom AFO for RLE.    Baseline  scheduled at  [facility] on 07-27-17; not yet received -- 08-24-17    Status  On-going      PT SHORT TERM GOAL #3   Title  Pt will stand for at least 2" without UE support with SBA to increase independence with ADL's.    Baseline  met 08-24-17    Status  Achieved      PT SHORT TERM GOAL #4   Title  Incr. gait velocity to >/= .9 ft/sec with RW for incr. gait efficiency.    Baseline  .73 ft/sec with RW (no AFO used); 1.50 ft/sec with RW - 08-24-17    Status  Achieved      PT SHORT TERM GOAL #5   Title  Pt will amb. with RW 350' with SBA on flat, even surface.    Baseline  met 08-24-17    Status  Achieved        PT Long Term Goals - 09/23/17 1413      PT LONG TERM GOAL #1   Title  Pt will increase gait velocity from .54 ft/sec to >/= 1.2 ft/sec with RW with AFO on RLE for incr. gait efficiency.    Baseline  .90 ft/sec with RW= 31.16 secs on 09-23-17 = 1.05 ft/sec    Status  On-going    Target Date  10/25/17      PT LONG TERM GOAL #2   Title  Pt will stand for at least 10" with UE support prn and reach 6" outside BOS without LOB for incr. independence and safety with ADL's.    Baseline  able reach 5" anteriorly - 09-23-17    Status  Partially Met    Target Date  10/25/17      PT LONG TERM GOAL #3   Title  Pt will negotiate 4 steps with Lt hand rail with CGA using a step by step sequence.    Status  Achieved      PT LONG TERM GOAL #4   Title  Independent in HEP for RLE strengthening and balance exercises.    Status  Achieved      PT LONG TERM GOAL #5   Title  Pt will amb. 500' outside with RW with AFO on RLE with SBA to allow pt to return walking in the park per her stated goal.    Status  On-going    Target Date  10/25/17            Plan - 09/24/17 1632    Clinical Impression Statement  Pt has now received modified AFO and shoe has been trimmed to allow pt to don AFO easily and fit into shoe.  Pt has met LTG's #3 and 4 with LTG #1 partially met with increased gait velocity and #2 partially met;  LTG #5 is ongoing as pt has just received modified AFO and shoe so that husband is now able to easily donn.  Pt amb. furthest distance today of 49' to date.                                                                                                                                            Rehab Potential  Good    Clinical Impairments Affecting Rehab Potential  severity of deficits with h/o prior CVA with Rt hemiparesis    PT Frequency  2x / week    PT Duration  8 weeks    PT Treatment/Interventions  ADLs/Self Care Home Management;Stair training;Gait training;DME Instruction;Therapeutic activities;Therapeutic exercise;Balance training;Neuromuscular re-education;Patient/family education;Orthotic Fit/Training;Passive range of motion    PT Next Visit Plan  renewal completed; continue gait training with AFO    PT Home Exercise Plan  see above    Consulted and Agree with Plan of Care  Patient       Patient will benefit from skilled therapeutic intervention in order to improve the following deficits and impairments:  Abnormal gait, Decreased endurance, Decreased activity tolerance, Decreased balance, Decreased coordination, Decreased range of motion, Decreased strength, Impaired tone, Impaired UE functional use, Postural dysfunction  Visit Diagnosis: Muscle weakness (generalized) - Plan: PT plan of care cert/re-cert  Unsteadiness on feet - Plan: PT plan of care cert/re-cert  Spastic hemiplegia of right dominant side as late effect of cerebral infarction ([place]) - Plan: PT plan of care cert/re-cert  Other abnormalities of gait and mobility - Plan: PT plan of care cert/re-cert     Problem List Patient Active Problem List   Diagnosis Date Noted  . Acute lower UTI   . Labile blood pressure   . Abnormal urinalysis   . Trauma   . Closed compression fracture of L4 lumbar vertebra   . Neurogenic bladder   . History of hypertension   . Anemia of chronic disease   . Hypoalbuminemia due to  protein-calorie malnutrition ([place])   . Acute bilateral cerebral infarction in a watershed distribution [facility]) 04/05/2017  . Benign essential HTN   . Chronic diastolic heart failure ([place])   . Right spastic hemiparesis ([place])   . History of stroke 02/16/2017  . Abnormality of gait  following cerebrovascular accident (CVA) 02/16/2017  . Slow transit constipation   . Acute blood loss anemia   . Adjustment disorder with depressed mood   . Cerebrovascular accident (CVA) ([place]) 02/03/2016  . Dysarthria, post-stroke   . Dysphagia, post-stroke   . Leukocytosis   . Prediabetes   . Right hemiparesis ([place])   . Cerebral infarction due to unspecified mechanism   . Other secondary hypertension   . Overactive bladder   . Diastolic dysfunction   . Hyperlipidemia   . Essential hypertension   . Essential tremor   . Stroke (cerebrum) ([place]) 02/01/2016  . Facial droop due to stroke 02/01/2016  . Essential and other specified forms of tremor 02/20/2013  . Dysphonia 02/20/2013  . Trigeminal neuralgia 02/20/2013    Alda Lea, PT 09/24/2017, 4:48 PM  [facility] [address] [place], [place], [address] Phone: [phone]   Fax:  [phone]  Name: ICEY TELLO MRN: [id number] Date of Birth: 07/25/1927

## 2017-09-27 ENCOUNTER — Encounter: Payer: Self-pay | Admitting: Physical Therapy

## 2017-09-27 ENCOUNTER — Ambulatory Visit: Payer: Medicare Other | Admitting: Physical Therapy

## 2017-09-27 DIAGNOSIS — R2689 Other abnormalities of gait and mobility: Secondary | ICD-10-CM | POA: Diagnosis not present

## 2017-09-27 DIAGNOSIS — M25641 Stiffness of right hand, not elsewhere classified: Secondary | ICD-10-CM | POA: Diagnosis not present

## 2017-09-27 DIAGNOSIS — R293 Abnormal posture: Secondary | ICD-10-CM | POA: Diagnosis not present

## 2017-09-27 DIAGNOSIS — I69351 Hemiplegia and hemiparesis following cerebral infarction affecting right dominant side: Secondary | ICD-10-CM | POA: Diagnosis not present

## 2017-09-27 DIAGNOSIS — R2681 Unsteadiness on feet: Secondary | ICD-10-CM

## 2017-09-27 DIAGNOSIS — M6281 Muscle weakness (generalized): Secondary | ICD-10-CM | POA: Diagnosis not present

## 2017-09-27 NOTE — Therapy (Signed)
[facility] [address] [place], [place], [address] Phone: [phone]   Fax:  [phone]  Physical Therapy Treatment  Patient Details  Name: Kelly [name] MRN: [id number] Date of Birth: Nov 26, 1927 Referring Provider: Dr Leonie Man   Encounter Date: 09/27/2017  PT End of Session - 09/27/17 1417    Visit Number  16    Number of Visits  23    Date for PT Re-Evaluation  10/29/17    Authorization Type  Medicare/[facility]    Authorization Time Period  07-26-17 - 09-24-17; 09-24-17 - 11-24-17    PT Start Time  1147    PT Stop Time  1236    PT Time Calculation (min)  49 min       Past Medical History:  Diagnosis Date  . Acute blood loss anemia   . Adjustment disorder with depressed mood   . CVA (cerebral vascular accident) ([place]) 02/03/2016   Linear infarct within the posterior limb of left internal capsule  . Diastolic CHF ([place])   . Dysphagia   . Dysphonia 02/20/2013  . Essential and other specified forms of tremor 02/20/2013  . HLD (hyperlipidemia)   . HTN (hypertension)   . OAB (overactive bladder)   . Osteoporosis   . Patient receiving subcutaneous heparin    For DVT prophylaxis 9/17  . Prediabetes   . Psoriasis   . Slow transit constipation   . Stroke (cerebrum) ([place]) 02/01/2016  . Trigeminal neuralgia 02/20/2013    Past Surgical History:  Procedure Laterality Date  . APPENDECTOMY  1940  . bladder tack    . CATARACT EXTRACTION Bilateral   . HEMORRHOID SURGERY  1960  . HUMERUS FRACTURE SURGERY    . LAPAROSCOPIC HYSTERECTOMY  2006  . torn rotator cuff    . WRIST FRACTURE SURGERY  2005   seconday shoulder 2001    There were no vitals filed for this visit.  Subjective Assessment - 09/27/17 1409    Subjective  Pt is ambulating into clinic with RW with AFO on RLE - states this is the first time she has worn brace to PT; says she did not wear it very much over the weekend    Patient is accompained by:  Family  member    Pertinent History  Rt MCA CVA on 02-03-16:  Cryptogenic CVA on 04-05-17; pt received OP PT at this facility from 06-01-16 - 11-19-16    Patient Stated Goals  "I would like to walk in the park"     Currently in Pain?  No/denies                       [facility] Adult PT Treatment/Exercise - 09/27/17 1159      Transfers   Transfers  Sit to Stand;Stand to Sit    Sit to Stand  4: Min guard    Stand to Sit  4: Min guard    Number of Reps  Other reps (comment) 5    Comments  no UE support used; feet on floor; pt performed from mat table      Ambulation/Gait   Ambulation/Gait  Yes    Ambulation/Gait Assistance  5: Supervision    Ambulation/Gait Assistance Details  AFO on RLE    Ambulation Distance (Feet)  300 Feet approx. 150' outside on sidewalk at end of session    Assistive device  Rolling walker    Gait Pattern  Decreased hip/knee flexion - right;Decreased dorsiflexion -  right;Decreased weight shift to right;Decreased step length - right    Ambulation Surface  Level;Indoor      Neuro Re-ed    Neuro Re-ed Details   Pt performed stepping over and back of quad cane on floor for improved SLS; 2# weight placed on RLE fo strengthening; no weight on LLE - pt had bil. UE support on RW; attempted to perform stepping with LLE with only RUE support but pt unable to do, possibly due to fear of falling      Lumbar Exercises: Aerobic   Nustep  Level 2 x 6" with UE's and LE's      Knee/Hip Exercises: Standing   Hip Flexion  Stengthening;Right;1 set;10 reps;Knee straight 2# weight on RLE    Hip Abduction  Stengthening;Right;1 set;10 reps;Knee straight 2# weight on RLE    Hip Extension  Stengthening;Right;1 set;10 reps 2# weight on RLE      NeuroRe-ed:  Pt performed touching balance bubbles for improved SLS - with bil. UE support on RW with CGA to min assist          PT Short Term Goals - 08/24/17 1457      PT SHORT TERM GOAL #1   Title  Pt will perform sit to stand 5 reps  from mat without UE support to demo improved LE strength.    Baseline  Pt able to perform without UE support but stabilizes legs against mat table upon initial standing -08-24-17    Status  Partially Met      PT SHORT TERM GOAL #2   Title  Pt will receive consult for custom AFO for RLE.    Baseline  scheduled at [facility] on 07-27-17; not yet received -- 08-24-17    Status  On-going      PT SHORT TERM GOAL #3   Title  Pt will stand for at least 2" without UE support with SBA to increase independence with ADL's.    Baseline  met 08-24-17    Status  Achieved      PT SHORT TERM GOAL #4   Title  Incr. gait velocity to >/= .9 ft/sec with RW for incr. gait efficiency.    Baseline  .32 ft/sec with RW (no AFO used); 1.50 ft/sec with RW - 08-24-17    Status  Achieved      PT SHORT TERM GOAL #5   Title  Pt will amb. with RW 350' with SBA on flat, even surface.    Baseline  met 08-24-17    Status  Achieved        PT Long Term Goals - 09/23/17 1413      PT LONG TERM GOAL #1   Title  Pt will increase gait velocity from .54 ft/sec to >/= 1.2 ft/sec with RW with AFO on RLE for incr. gait efficiency.    Baseline  .73 ft/sec with RW= 31.16 secs on 09-23-17 = 1.05 ft/sec    Status  On-going    Target Date  10/25/17      PT LONG TERM GOAL #2   Title  Pt will stand for at least 10" with UE support prn and reach 6" outside BOS without LOB for incr. independence and safety with ADL's.    Baseline  able reach 5" anteriorly - 09-23-17    Status  Partially Met    Target Date  10/25/17      PT LONG TERM GOAL #3   Title  Pt will negotiate 4 steps with Lt  hand rail with CGA using a step by step sequence.    Status  Achieved      PT LONG TERM GOAL #4   Title  Independent in HEP for RLE strengthening and balance exercises.    Status  Achieved      PT LONG TERM GOAL #5   Title  Pt will amb. 500' outside with RW with AFO on RLE with SBA to allow pt to return walking in the park per her stated goal.    Status   On-going    Target Date  10/25/17            Plan - 09/27/17 1421    Clinical Impression Statement  Pt is progressing well with improving endurance - pt gait trained outside for first time today - pt amb. outside at end of session approx. 150' with RW (pt amb. inside at beginning of PT session 300' with AFO on RLE and with RW);  pt fatigued with ambulating outside - had manual wheelchair available for pt sit when needed    Rehab Potential  Good    Clinical Impairments Affecting Rehab Potential  severity of deficits with h/o prior CVA with Rt hemiparesis    PT Frequency  2x / week    PT Duration  8 weeks    PT Treatment/Interventions  ADLs/Self Care Home Management;Stair training;Gait training;DME Instruction;Therapeutic activities;Therapeutic exercise;Balance training;Neuromuscular re-education;Patient/family education;Orthotic Fit/Training;Passive range of motion    PT Next Visit Plan  cont gait training - inside/outside as pt able to tolerate; endurance activities, RLE strengthening and balance training    PT Home Exercise Plan  see above    Consulted and Agree with Plan of Care  Patient    Family Member Consulted  husband        Patient will benefit from skilled therapeutic intervention in order to improve the following deficits and impairments:  Abnormal gait, Decreased endurance, Decreased activity tolerance, Decreased balance, Decreased coordination, Decreased range of motion, Decreased strength, Impaired tone, Impaired UE functional use, Postural dysfunction  Visit Diagnosis: Muscle weakness (generalized)  Unsteadiness on feet  Other abnormalities of gait and mobility     Problem List Patient Active Problem List   Diagnosis Date Noted  . Acute lower UTI   . Labile blood pressure   . Abnormal urinalysis   . Trauma   . Closed compression fracture of L4 lumbar vertebra   . Neurogenic bladder   . History of hypertension   . Anemia of chronic disease   .  Hypoalbuminemia due to protein-calorie malnutrition ([place])   . Acute bilateral cerebral infarction in a watershed distribution [facility]) 04/05/2017  . Benign essential HTN   . Chronic diastolic heart failure ([place])   . Right spastic hemiparesis ([place])   . History of stroke 02/16/2017  . Abnormality of gait following cerebrovascular accident (CVA) 02/16/2017  . Slow transit constipation   . Acute blood loss anemia   . Adjustment disorder with depressed mood   . Cerebrovascular accident (CVA) ([place]) 02/03/2016  . Dysarthria, post-stroke   . Dysphagia, post-stroke   . Leukocytosis   . Prediabetes   . Right hemiparesis ([place])   . Cerebral infarction due to unspecified mechanism   . Other secondary hypertension   . Overactive bladder   . Diastolic dysfunction   . Hyperlipidemia   . Essential hypertension   . Essential tremor   . Stroke (cerebrum) ([place]) 02/01/2016  . Facial droop due to stroke 02/01/2016  . Essential and other  specified forms of tremor 02/20/2013  . Dysphonia 02/20/2013  . Trigeminal neuralgia 02/20/2013    [place], PT 09/27/2017, 2:28 PM  [facility] [address] [place], [place], [address] Phone: [phone]   Fax:  [phone]  Name: Kelly [name] MRN: [id number] Date of Birth: [birth year]

## 2017-09-28 ENCOUNTER — Encounter: Payer: Self-pay | Admitting: Physical Therapy

## 2017-09-30 ENCOUNTER — Ambulatory Visit: Payer: Medicare Other | Admitting: Occupational Therapy

## 2017-09-30 ENCOUNTER — Encounter: Payer: Self-pay | Admitting: Occupational Therapy

## 2017-09-30 ENCOUNTER — Ambulatory Visit: Payer: Medicare Other | Admitting: Physical Therapy

## 2017-09-30 DIAGNOSIS — R293 Abnormal posture: Secondary | ICD-10-CM

## 2017-09-30 DIAGNOSIS — M6281 Muscle weakness (generalized): Secondary | ICD-10-CM

## 2017-09-30 DIAGNOSIS — R2681 Unsteadiness on feet: Secondary | ICD-10-CM

## 2017-09-30 DIAGNOSIS — R29818 Other symptoms and signs involving the nervous system: Secondary | ICD-10-CM

## 2017-09-30 DIAGNOSIS — M25641 Stiffness of right hand, not elsewhere classified: Secondary | ICD-10-CM

## 2017-09-30 DIAGNOSIS — I69351 Hemiplegia and hemiparesis following cerebral infarction affecting right dominant side: Secondary | ICD-10-CM | POA: Diagnosis not present

## 2017-09-30 DIAGNOSIS — R278 Other lack of coordination: Secondary | ICD-10-CM

## 2017-09-30 DIAGNOSIS — M25611 Stiffness of right shoulder, not elsewhere classified: Secondary | ICD-10-CM

## 2017-09-30 DIAGNOSIS — I69315 Cognitive social or emotional deficit following cerebral infarction: Secondary | ICD-10-CM

## 2017-09-30 DIAGNOSIS — R2689 Other abnormalities of gait and mobility: Secondary | ICD-10-CM | POA: Diagnosis not present

## 2017-09-30 NOTE — Addendum Note (Signed)
Addended by: Keene Breath B on: 09/30/2017 01:39 PM   Modules accepted: Orders

## 2017-09-30 NOTE — Therapy (Signed)
[facility] [address] [place], [place], [address] Phone: [phone]   Fax:  [phone]  Occupational Therapy Treatment  Patient Details  Name: Kelly [name] MRN: [id number] Date of Birth: 05/22/1928 Referring Provider: Dr Leonie Man   Encounter Date: 09/30/2017  OT End of Session - 09/30/17 1722    Visit Number  18    Number of Visits  25    Date for OT Re-Evaluation  10/22/17    Authorization Type  medicare    Authorization Time Period  60 days- renewed for 2x week for 4 weeks on 09/23/17, next week will be 1/4 weeks    OT Start Time  1317    OT Stop Time  1400    OT Time Calculation (min)  43 min    Activity Tolerance  Patient tolerated treatment well       Past Medical History:  Diagnosis Date  . Acute blood loss anemia   . Adjustment disorder with depressed mood   . CVA (cerebral vascular accident) ([place]) 02/03/2016   Linear infarct within the posterior limb of left internal capsule  . Diastolic CHF ([place])   . Dysphagia   . Dysphonia 02/20/2013  . Essential and other specified forms of tremor 02/20/2013  . HLD (hyperlipidemia)   . HTN (hypertension)   . OAB (overactive bladder)   . Osteoporosis   . Patient receiving subcutaneous heparin    For DVT prophylaxis 9/17  . Prediabetes   . Psoriasis   . Slow transit constipation   . Stroke (cerebrum) ([place]) 02/01/2016  . Trigeminal neuralgia 02/20/2013    Past Surgical History:  Procedure Laterality Date  . APPENDECTOMY  1940  . bladder tack    . CATARACT EXTRACTION Bilateral   . HEMORRHOID SURGERY  1960  . HUMERUS FRACTURE SURGERY    . LAPAROSCOPIC HYSTERECTOMY  2006  . torn rotator cuff    . WRIST FRACTURE SURGERY  2005   seconday shoulder 2001    There were no vitals filed for this visit.  Subjective Assessment - 09/30/17 1323    Subjective   The brace is much better - sometimes Ulice Dash gets the shoe too tight though.     Patient is accompained by:   Family member husband    Pertinent History  L MCA 01/2016, Multiple bilateral CVA's 03/2017, HTN, CHF    Currently in Pain?  No/denies                   OT Treatments/Exercises (OP) - 09/30/17 0001      ADLs   Home Maintenance  Addressed pt's ability to walk into kitchen, open dishwasher door, load 3 light items into top shelf and close door.  Pt used RUE to stabilize her balance on counter while loading dishes with her left hand.  Pt demonstrated good safety awareness and balance and functional ambulation much improved with new AFO. Pt in agreement and able to verbalize that she feels more steady       Neurological Re-education Exercises   Other Exercises 1  Neuro re ed to address grasp, relaxation of of R hand as prep to active extension and beginning ability to initiate extension of fingers.  Pt does activate muscles for finger extension however is unable to over ride tone in R hand. Feel pt has some isolated finger extension. Dicussed possible follow up botox (pt had one for RUE in mid February).  Pt does not currently have additonal appt with  physiatry -therefore sent message via epic regarding possible consideration of botox focusing mostly on her hand and pt's husband to call Monday to make appt.                 OT Short Term Goals - 09/30/17 1720      OT SHORT TERM GOAL #1   Title  Patient will complete a home exercise program designed to improve range of motion in right arm- with min prompting.  Due 08/25/17    Status  Achieved      OT SHORT TERM GOAL #2   Title  Patient will complete a home activity program designed to increase functional use of right UE with min prompting    Status  Achieved      OT SHORT TERM GOAL #3   Title  Patient will stand at counter and remove 2 lightweight items (silverware, lightweight saucer)  from lower rack of dishwasher with close supervision    Status  Achieved      OT SHORT TERM GOAL #4   Title  Patient will reach forward to  to obtain a lightweight item (shirt) from lower surface while seated demonstrating 45 degrees of shoulder flexion with sufficient hand opening and grasp.      Status  Achieved      OT SHORT TERM GOAL #5   Title  Pt will be able to cut food using bilateral upper extremities and min assist    Status  Not Met continue goal      OT SHORT TERM GOAL #6   Title  Patient will bring finger food to mouth once placed in right hand    Status  Achieved        OT Long Term Goals - 09/30/17 1720      OT LONG TERM GOAL #1   Title  Patient will complete an HEP designed to improve RUE AROM/PROM with mod I due 5/3    Status  Achieved      OT LONG TERM GOAL #2   Title  Patient will independently complete a home activity program designed to improve right UE functional ability - check 10/22/17    Status  On-going not fully addressed      OT LONG TERM GOAL #3   Title  Pt will be able to write name legibly with LUE and AE prn    Status  Achieved      OT LONG TERM GOAL #4   Title  Patient will load 2-3 lightweight items into dishwasher after each meal- using right upper extremity- check 10/22/17    Status  On-going Pt has not attempted      OT LONG TERM GOAL #5   Title  Patient will use RUE to wipe down countertops in kitchen after meals to assist with clean up     Status  Partially [place] #6   Title  Patient will wash lightweight dishes after meals using BUE's    Status  Not Met goal d/c      OT LONG TERM GOAL #7   Title  Patient will demonstrate ability to use right arm to open cupboard doors and drawers in her home check 10/22/17    Status  On-going Pt is not performing consistently      OT LONG TERM GOAL #8   Title  Pt will feed herself finger foods at least 50% of the time with RUE.- check 10/22/17  Time  4    Period  Weeks    Status  On-going      OT LONG TERM GOAL  #10   Time  4            Plan - 09/30/17 1721    Clinical Impression Statement  Pt with slow  progress toward goals. Pt with significant tone in R hand however is able to initiate isolated grasp and beginning release.      Occupational Profile and client history currently impacting functional performance  Wife, mother, fiercely independent prior to CVA's, active in church    Occupational performance deficits (Please refer to evaluation for details):  ADL's;IADL's;Social Participation;Leisure    Rehab Potential  Good    OT Frequency  2x / week    OT Duration  4 weeks    OT Treatment/Interventions  Moist Heat;Fluidtherapy;DME and/or AE instruction;Splinting;Balance training;Therapeutic activities;Therapeutic exercise;Cognitive remediation/compensation;Neuromuscular education;Functional Mobility Training;Patient/family education;Manual Therapy;Paraffin;Electrical Stimulation;Self-care/ADL training    Plan  work towards unmet and updated goals, NMR/ functional use of RUE    Consulted and Agree with Plan of Care  Patient;Family member/caregiver    Family Member Consulted  husband       Patient will benefit from skilled therapeutic intervention in order to improve the following deficits and impairments:  Decreased coordination, Decreased range of motion, Difficulty walking, Impaired flexibility, Improper body mechanics, Decreased safety awareness, Increased edema, Impaired sensation, Decreased activity tolerance, Impaired tone, Decreased balance, Decreased knowledge of use of DME, Impaired UE functional use, Decreased cognition, Decreased mobility, Decreased strength, Impaired perceived functional ability  Visit Diagnosis: Muscle weakness (generalized)  Unsteadiness on feet  Spastic hemiplegia of right dominant side as late effect of cerebral infarction (HCC)  Stiffness of right hand, not elsewhere classified  Stiffness of right shoulder, not elsewhere classified  Other symptoms and signs involving the nervous system  Other lack of coordination  Abnormal posture  Cognitive social or  emotional deficit following cerebral infarction    Problem List Patient Active Problem List   Diagnosis Date Noted  . Acute lower UTI   . Labile blood pressure   . Abnormal urinalysis   . Trauma   . Closed compression fracture of L4 lumbar vertebra   . Neurogenic bladder   . History of hypertension   . Anemia of chronic disease   . Hypoalbuminemia due to protein-calorie malnutrition ([place])   . Acute bilateral cerebral infarction in a watershed distribution [facility]) 04/05/2017  . Benign essential HTN   . Chronic diastolic heart failure ([place])   . Right spastic hemiparesis ([place])   . History of stroke 02/16/2017  . Abnormality of gait following cerebrovascular accident (CVA) 02/16/2017  . Slow transit constipation   . Acute blood loss anemia   . Adjustment disorder with depressed mood   . Cerebrovascular accident (CVA) ([place]) 02/03/2016  . Dysarthria, post-stroke   . Dysphagia, post-stroke   . Leukocytosis   . Prediabetes   . Right hemiparesis ([place])   . Cerebral infarction due to unspecified mechanism   . Other secondary hypertension   . Overactive bladder   . Diastolic dysfunction   . Hyperlipidemia   . Essential hypertension   . Essential tremor   . Stroke (cerebrum) ([place]) 02/01/2016  . Facial droop due to stroke 02/01/2016  . Essential and other specified forms of tremor 02/20/2013  . Dysphonia 02/20/2013  . Trigeminal neuralgia 02/20/2013    Quay Burow, OTR/L 09/30/2017, 5:23 PM  [facility] [phone]  [place], [place], [address] Phone: [phone]   Fax:  [phone]  Name: EBONIE WESTERLUND MRN: [id number] Date of Birth: [birth year]

## 2017-10-01 ENCOUNTER — Encounter

## 2017-10-01 ENCOUNTER — Ambulatory Visit: Payer: Medicare Other | Admitting: Physical Therapy

## 2017-10-01 ENCOUNTER — Encounter: Payer: Self-pay | Admitting: Physical Therapy

## 2017-10-01 DIAGNOSIS — R2689 Other abnormalities of gait and mobility: Secondary | ICD-10-CM | POA: Diagnosis not present

## 2017-10-01 DIAGNOSIS — R2681 Unsteadiness on feet: Secondary | ICD-10-CM

## 2017-10-01 DIAGNOSIS — R293 Abnormal posture: Secondary | ICD-10-CM

## 2017-10-01 DIAGNOSIS — I69351 Hemiplegia and hemiparesis following cerebral infarction affecting right dominant side: Secondary | ICD-10-CM | POA: Diagnosis not present

## 2017-10-01 DIAGNOSIS — M25641 Stiffness of right hand, not elsewhere classified: Secondary | ICD-10-CM | POA: Diagnosis not present

## 2017-10-01 DIAGNOSIS — M6281 Muscle weakness (generalized): Secondary | ICD-10-CM | POA: Diagnosis not present

## 2017-10-01 NOTE — Therapy (Signed)
[facility] [address] [place], [place], [address] Phone: [phone]   Fax:  [phone]  Physical Therapy Treatment  Patient Details  Name: Kelly [name] MRN: [id number] Date of Birth: 02/17/1928 Referring Provider: Dr Leonie Man   Encounter Date: 10/01/2017  PT End of Session - 10/01/17 1722    Visit Number  18    Number of Visits  23    Date for PT Re-Evaluation  10/29/17    Authorization Type  Medicare/[facility]    Authorization Time Period  07-26-17 - 09-24-17; 09-24-17 - 11-24-17    PT Start Time  1446    PT Stop Time  1535    PT Time Calculation (min)  49 min    Equipment Utilized During Treatment  Gait belt    Activity Tolerance  Patient tolerated treatment well    Behavior During Therapy  [facility] for tasks assessed/performed       Past Medical History:  Diagnosis Date  . Acute blood loss anemia   . Adjustment disorder with depressed mood   . CVA (cerebral vascular accident) ([place]) 02/03/2016   Linear infarct within the posterior limb of left internal capsule  . Diastolic CHF ([place])   . Dysphagia   . Dysphonia 02/20/2013  . Essential and other specified forms of tremor 02/20/2013  . HLD (hyperlipidemia)   . HTN (hypertension)   . OAB (overactive bladder)   . Osteoporosis   . Patient receiving subcutaneous heparin    For DVT prophylaxis 9/17  . Prediabetes   . Psoriasis   . Slow transit constipation   . Stroke (cerebrum) ([place]) 02/01/2016  . Trigeminal neuralgia 02/20/2013    Past Surgical History:  Procedure Laterality Date  . APPENDECTOMY  1940  . bladder tack    . CATARACT EXTRACTION Bilateral   . HEMORRHOID SURGERY  1960  . HUMERUS FRACTURE SURGERY    . LAPAROSCOPIC HYSTERECTOMY  2006  . torn rotator cuff    . WRIST FRACTURE SURGERY  2005   seconday shoulder 2001    There were no vitals filed for this visit.  Subjective Assessment - 10/01/17 1445    Subjective  I'm going to have some more  injections in my hand and then they are going to use that machine to get my hand to get a response. Did not have a chance to go walk in the park, but did go to a show in [facility] Thursday evening.    Pertinent History  Rt MCA CVA on 02-03-16:  Cryptogenic CVA on 04-05-17; pt received OP PT at this facility from 06-01-16 - 11-19-16    Patient Stated Goals  "I would like to walk in the park"     Currently in Pain?  No/denies                       [facility] Adult PT Treatment/Exercise - 10/01/17 1506      Transfers   Transfers  Sit to Stand;Stand to Sit    Sit to Stand  4: Min guard    Stand to Sit  4: Min guard      Ambulation/Gait   Ambulation/Gait Assistance  5: Supervision    Ambulation/Gait Assistance Details  AFO RLE; vc for upright posture and proximity to RW    Ambulation Distance (Feet)  250 Feet 100, 40 x 2    Assistive device  Rolling walker    Gait Pattern  Decreased hip/knee flexion - right;Decreased  dorsiflexion - right;Decreased weight shift to right;Decreased step length - right    Ambulation Surface  Level;Indoor;Outdoor;Paved    Stairs  Yes    Stairs Assistance  4: Min assist    Stairs Assistance Details (indicate cue type and reason)  for balance as ascending, slight posterior lean    Stair Management Technique  One rail Left;Step to pattern;Forwards    Number of Stairs  4    Height of Stairs  6    Gait Comments  husband reports pt has been having more problems ascending the steps when they return home "like her brain and her LEFT leg are not communicating'      Posture/Postural Control   Posture/Postural Control  Postural limitations    Postural Limitations  Increased thoracic kyphosis;Forward head;Rounded Shoulders;Flexed trunk;Weight shift left    Posture Comments  vc throughout session with pt able to partially correct      High Level Balance   High Level Balance Activities  Side stepping;Backward walking;Tandem walking in // bars with lt hand only holding     High Level Balance Comments  partial tandem stance with each foot in front x 30 seconds with very light support of LUE on // bar      Knee/Hip Exercises: Standing   Knee Flexion  Strengthening;Both;1 set;10 reps lt 5#; rt 3#    Hip Flexion  Stengthening;Both;1 set;10 reps lt 5#, rt 3#    Lateral Step Up  Left;Step Height: 6" 2 reps     Forward Step Up  Right;1 set;10 reps;Hand Hold: 1;Step Height: 6"               PT Short Term Goals - 08/24/17 1457      PT SHORT TERM GOAL #1   Title  Pt will perform sit to stand 5 reps from mat without UE support to demo improved LE strength.    Baseline  Pt able to perform without UE support but stabilizes legs against mat table upon initial standing -08-24-17    Status  Partially Met      PT SHORT TERM GOAL #2   Title  Pt will receive consult for custom AFO for RLE.    Baseline  scheduled at [facility] on 07-27-17; not yet received -- 08-24-17    Status  On-going      PT SHORT TERM GOAL #3   Title  Pt will stand for at least 2" without UE support with SBA to increase independence with ADL's.    Baseline  met 08-24-17    Status  Achieved      PT SHORT TERM GOAL #4   Title  Incr. gait velocity to >/= .9 ft/sec with RW for incr. gait efficiency.    Baseline  .30 ft/sec with RW (no AFO used); 1.50 ft/sec with RW - 08-24-17    Status  Achieved      PT SHORT TERM GOAL #5   Title  Pt will amb. with RW 350' with SBA on flat, even surface.    Baseline  met 08-24-17    Status  Achieved        PT Long Term Goals - 09/23/17 1413      PT LONG TERM GOAL #1   Title  Pt will increase gait velocity from .54 ft/sec to >/= 1.2 ft/sec with RW with AFO on RLE for incr. gait efficiency.    Baseline  .54 ft/sec with RW= 31.16 secs on 09-23-17 = 1.05 ft/sec    Status  On-going    Target Date  10/25/17      PT LONG TERM GOAL #2   Title  Pt will stand for at least 10" with UE support prn and reach 6" outside BOS without LOB for incr. independence and safety with  ADL's.    Baseline  able reach 5" anteriorly - 09-23-17    Status  Partially Met    Target Date  10/25/17      PT LONG TERM GOAL #3   Title  Pt will negotiate 4 steps with Lt hand rail with CGA using a step by step sequence.    Status  Achieved      PT LONG TERM GOAL #4   Title  Independent in HEP for RLE strengthening and balance exercises.    Status  Achieved      PT LONG TERM GOAL #5   Title  Pt will amb. 500' outside with RW with AFO on RLE with SBA to allow pt to return walking in the park per her stated goal.    Status  On-going    Target Date  10/25/17            Plan - 10/01/17 1723    Clinical Impression Statement  Patient progressing with increasing bil LE strength and muscular endurance. She requires assist for sequencing and to maintain forward weight shift when ascending the steps with left rail. Again worked on Personnel officer with pt walking 200 ft on outside sidewalk (with stiff wind at her face) with husband following with w/c. Very pleased to be walking outside in the sunshine. Noted rt foot dragging on sidewalk x 2, LOB x1 due to RW hitting unlevel seam in sidewalk with pt able to recover without physical assist.     Rehab Potential  Good    Clinical Impairments Affecting Rehab Potential  severity of deficits with h/o prior CVA with Rt hemiparesis    PT Frequency  2x / week    PT Duration  8 weeks    PT Treatment/Interventions  ADLs/Self Care Home Management;Stair training;Gait training;DME Instruction;Therapeutic activities;Therapeutic exercise;Balance training;Neuromuscular re-education;Patient/family education;Orthotic Fit/Training;Passive range of motion    PT Next Visit Plan  cont gait training - inside/outside as pt able to tolerate; stair training (has been more difficult per husband); endurance activities, RLE strengthening and balance training    PT Home Exercise Plan  see above    Consulted and Agree with Plan of Care  Patient;Family member/caregiver     Family Member Consulted  husband        Patient will benefit from skilled therapeutic intervention in order to improve the following deficits and impairments:  Abnormal gait, Decreased endurance, Decreased activity tolerance, Decreased balance, Decreased coordination, Decreased range of motion, Decreased strength, Impaired tone, Impaired UE functional use, Postural dysfunction  Visit Diagnosis: Muscle weakness (generalized)  Unsteadiness on feet  Abnormal posture  Other abnormalities of gait and mobility     Problem List Patient Active Problem List   Diagnosis Date Noted  . Acute lower UTI   . Labile blood pressure   . Abnormal urinalysis   . Trauma   . Closed compression fracture of L4 lumbar vertebra   . Neurogenic bladder   . History of hypertension   . Anemia of chronic disease   . Hypoalbuminemia due to protein-calorie malnutrition ([place])   . Acute bilateral cerebral infarction in a watershed distribution [facility]) 04/05/2017  . Benign essential HTN   . Chronic diastolic heart failure ([place])   .  Right spastic hemiparesis ([place])   . History of stroke 02/16/2017  . Abnormality of gait following cerebrovascular accident (CVA) 02/16/2017  . Slow transit constipation   . Acute blood loss anemia   . Adjustment disorder with depressed mood   . Cerebrovascular accident (CVA) ([place]) 02/03/2016  . Dysarthria, post-stroke   . Dysphagia, post-stroke   . Leukocytosis   . Prediabetes   . Right hemiparesis ([place])   . Cerebral infarction due to unspecified mechanism   . Other secondary hypertension   . Overactive bladder   . Diastolic dysfunction   . Hyperlipidemia   . Essential hypertension   . Essential tremor   . Stroke (cerebrum) ([place]) 02/01/2016  . Facial droop due to stroke 02/01/2016  . Essential and other specified forms of tremor 02/20/2013  . Dysphonia 02/20/2013  . Trigeminal neuralgia 02/20/2013    Rexanne Mano, PT 10/01/2017, 5:28 PM  [facility] [address] [place], [place], [address] Phone: [phone]   Fax:  [phone]  Name: COSETTE PRINDLE MRN: [id number] Date of Birth: 02/27/1928

## 2017-10-04 ENCOUNTER — Ambulatory Visit (INDEPENDENT_AMBULATORY_CARE_PROVIDER_SITE_OTHER): Payer: Medicare Other | Admitting: Adult Health

## 2017-10-04 ENCOUNTER — Encounter: Payer: Self-pay | Admitting: Adult Health

## 2017-10-04 VITALS — BP 100/62 | HR 79 | Ht 60.0 in | Wt 94.2 lb

## 2017-10-04 DIAGNOSIS — E785 Hyperlipidemia, unspecified: Secondary | ICD-10-CM

## 2017-10-04 DIAGNOSIS — G8111 Spastic hemiplegia affecting right dominant side: Secondary | ICD-10-CM

## 2017-10-04 DIAGNOSIS — I6389 Other cerebral infarction: Secondary | ICD-10-CM | POA: Diagnosis not present

## 2017-10-04 NOTE — Progress Notes (Signed)
I agree with the above plan 

## 2017-10-04 NOTE — Patient Instructions (Addendum)
Continue clopidogrel 75 mg daily  and lipitor  for secondary stroke prevention  Continue to follow up with PCP regarding cholesterol management   Continue physical and occupational therapies  Continue to monitor blood pressure at home  Maintain strict control of hypertension with blood pressure goal below 130/90, diabetes with hemoglobin A1c goal below 6.5% and cholesterol with LDL cholesterol (bad cholesterol) goal below 70 mg/dL. I also advised the patient to eat a healthy diet with plenty of whole grains, cereals, fruits and vegetables, exercise regularly and maintain ideal body weight.  Followup in the future with me in 6 months or call earlier if needed       Thank you for coming to see [place] at [facility]. I hope we have been able to provide you high quality care today.  You may receive a patient satisfaction survey over the next few weeks. We would appreciate your feedback and comments so that we may continue to improve ourselves and the health of our patients.

## 2017-10-04 NOTE — Progress Notes (Signed)
[facility] [address] [place]. [place] [address] [phone]       OFFICE FOLLOW-UP VISIT NOTE  [name] Date of Birth:  04/10/1928 Medical Record Number:  [id number]   Referring MD: Cruzita Lederer, PA-C Reason for Referral: stroke f/u HPI: Kelly [name] is a pleasant 84 year old Caucasian lady who is accompanied today by her husband and seen for  follow-up visit for stroke in November 2018. History is obtained from them as well as review of electronic medical records. I personally reviewed imaging films. Kelly [name] an 84 y.o.femalewho presented via EMS as a Code Stroke. LKN was 2100 on Saturday night. She was being helped from the bathroom by family when she suddenly was unable to stand due to RLE weakness. Family also noted that she could not move her RUE, so 911 was called. Of note, she has chronic RUE and RLE weakness secondary to prior left MCA stroke. She gets botox injections to her RUE to improve mobility. In CT, the patient stated that her RUE was still weaker than normal, but that she was improving. Her RLE was back to normal per patient. She takes ASA daily. She is not on any anticoagulation. Stroke risk factors include history of stroke, CHF, HLD and HTN.LSN:2100. 04/02/17.tPA Given:No:Rapid symptomatic improvement. Risks of tPA outweigh benefits. MRI scan of the brain showed subacute tiny left parietal and cortex and white matter as well as smaller right frontal and parietal embolic infarcts. MRA of the brain showed no emergent large vessel occlusion or stenosis. Carotid ultrasound showed no significant extracranial stenosis. Transthoracic echo showed normal ejection fraction without cardiac source of embolism. Telemetry monitoring did not show cortex was of embolism. LDL cholesterol is 58 mg percent and hemoglobin A1c was 5.6. Patient strokes were felt to be embolic but she was not felt to be a candidate for long-term anticoagulation given due to  her frail body habitus, significant fall risk and advanced age hence TEE and prolonged cardiac monitoring were not done. Patient was transferred to inpatient rehabilitation where she made steady improvement and she is presently living at home with her husband. She is currently getting home health physical and occupational therapy. She is able to walk with a walker but the husband*walk behind her with a chair. She is starting Plavix well without bleeding or bruising. Her blood pressure is well controlled and today it is 110/78. She is tolerating Lipitor 40 mg well without muscle aches or pains. She's had no falls or injuries. She has a remote history of left internal capsule infarct in September 2017 with some residual right-sided weakness  10/04/17 UPDATE: Patient returns today for follow-up appointment is accompanied by her husband.  She continues to take Plavix without side effects of bleeding.  Continues to take Lipitor without side effects myalgias.  Blood pressure today satisfactory at 100/62 and this is typical for patient.  She will be having Botox injections next Wednesday by Dr. Allena Katz for right upper extremity spasticity.  She is currently living with her husband and has assistance with some ADLs.  She continues to go to PT/OT at our neuro rehab center 2 times per week.  Currently walking with rolling walker for short distances.  Denies new or worsening stroke/TIA symptoms.  ROS:   14 system review of systems is positive for hearing loss, loss of vision, incontinence of bowels, incontinence of bladder, walking difficulty and tremors and all other systems negative  PMH:  Past Medical History:  Diagnosis Date  .  Acute blood loss anemia   . Adjustment disorder with depressed mood   . CVA (cerebral vascular accident) ([facility]) 02/03/2016   Linear infarct within the posterior limb of left internal capsule  . Diastolic CHF ([facility])   . Dysphagia   . Dysphonia 02/20/2013  . Essential and other specified  forms of tremor 02/20/2013  . HLD (hyperlipidemia)   . HTN (hypertension)   . OAB (overactive bladder)   . Osteoporosis   . Patient receiving subcutaneous heparin    For DVT prophylaxis 9/17  . Prediabetes   . Psoriasis   . Slow transit constipation   . Stroke (cerebrum) ([facility]) 02/01/2016  . Trigeminal neuralgia 02/20/2013    Social History:  Social History   Socioeconomic History  . Marital status: Married    Spouse name: [name]  . Number of children: 3  . Years of education: college  . Highest education level: Not on file  Occupational History  . Occupation: real estate    Comment: retired  Engineer, production  . Financial resource strain: Not very hard  . Food insecurity:    Worry: Patient refused    Inability: Patient refused  . Transportation needs:    Medical: Patient refused    Non-medical: Patient refused  Tobacco Use  . Smoking status: Never Smoker  . Smokeless tobacco: Never Used  Substance and Sexual Activity  . Alcohol use: Yes    Comment: Wine 2 glass daily occasionally  . Drug use: No  . Sexual activity: Not Currently    Partners: Male    Birth control/protection: Post-menopausal  Lifestyle  . Physical activity:    Days per week: Not on file    Minutes per session: Not on file  . Stress: Not on file  Relationships  . Social connections:    Talks on phone: Not on file    Gets together: Not on file    Attends religious service: Not on file    Active member of club or organization: Not on file    Attends meetings of clubs or organizations: Not on file    Relationship status: Not on file  . Intimate partner violence:    Fear of current or ex partner: Not on file    Emotionally abused: Not on file    Physically abused: Not on file    Forced sexual activity: Not on file  Other Topics Concern  . Not on file  Social History Narrative   Lives at home with her husband.   Right-handed.   No caffeine use.    Medications:   Current Outpatient Medications  on File Prior to Visit  Medication Sig Dispense Refill  . atorvastatin (LIPITOR) 40 MG tablet Take 1 tablet (40 mg total) daily at 6 PM by mouth. 30 tablet 0  . clopidogrel (PLAVIX) 75 MG tablet Take 1 tablet (75 mg total) daily by mouth. 120 tablet 0  . oxybutynin (DITROPAN) 5 MG tablet Take 1 tablet (5 mg total) at bedtime by mouth. 30 tablet 0  . topiramate (TOPAMAX) 50 MG tablet Take 1 tablet (50 mg total) 2 (two) times daily by mouth. 60 tablet 1   No current facility-administered medications on file prior to visit.     Allergies:  No Known Allergies  Vitals:   10/04/17 1403  BP: 100/62  Pulse: 79    Physical Exam General: Frail cachectic malnourished-looking elderly Caucasian lady seated, in no evident distress Head: head normocephalic and atraumatic.   Neck: supple with no carotid  or supraclavicular bruits Cardiovascular: regular rate and rhythm, no murmurs Musculoskeletal: no deformity Skin:  no rash/petichiae Vascular:  Normal pulses all extremities  Neurologic Exam Mental Status: Awake and fully alert. Oriented to place and time. Recent and remote memory diminished. Attention span, concentration and fund of knowledge diminished. Mood and affect appropriate.  Cranial Nerves: Fundoscopic exam reveals sharp disc margins. Pupils equal, briskly reactive to light. Extraocular movements full without nystagmus. Visual fields full to confrontation. Hearing slightly diminished. Facial sensation intact., tongue, palate and face moves normally and symmetrically.  Motor: Mild spastic right hemiparesis with 4/5 right upper extremity strength with right grip weakness. Mild right hip exam ankle dorsiflexor weakness 4/5. Tone is increased on the right side compared to the left side. Normal strength in the left. Mild tremors of outstretched upper extremities with action but absent at rest. Sensory.: intact to touch , pinprick , position and vibratory sensation.  Coordination: Mildly impaired  on the right and normal on the left.  Orbits left over right arm Gait and Station: Ambulating with rolling walker, stance is hunched, ambulation with short steps, heel, toe and tandem walk not tested Reflexes: 2+ and asymmetric and brisker on the right Toes downgoing.     ASSESSMENT: 40 year pleasant Caucasian lady with the by several tiny embolic infarcts of cryptogenic etiology in November 2018 with vascular risk factors of hyperlipidemia, prior stroke in 2017 and advanced age. TEE and prolonged cardiac monitoring were not done given high fall risk status. Patient returns today for a follow up visit and overall, is doing well.     PLAN: -Continue clopidogrel 75 mg daily  and lipitor  for secondary stroke prevention -F/u with PCP regarding your HLD and HTN management -continue to monitor BP at home -Continue PT/OT -please notify us if additional orders need to be made -Maintain strict control of hypertension with blood pressure goal below 130/90, diabetes with hemoglobin A1c goal below 6.5% and cholesterol with LDL cholesterol (bad cholesterol) goal below 70 mg/dL. I also advised the patient to eat a healthy diet with plenty of whole grains, cereals, fruits and vegetables, exercise regularly and maintain ideal body weight.  Follow up in 6 months or call earlier if needed  Greater than 50% of time during this 25 minute visit was spent on counseling,explanation of diagnosis of cryptogenic stroke, reviewing risk factor management of HLD and HTN, planning of further management, discussion with patient and family and coordination of care  [name] Hugh, [facility]  [facility] [address] [place], [place] [address]  Phone [phone] Fax [phone]

## 2017-10-05 ENCOUNTER — Ambulatory Visit: Payer: Medicare Other | Admitting: Physical Therapy

## 2017-10-05 ENCOUNTER — Encounter: Payer: Self-pay | Admitting: Occupational Therapy

## 2017-10-05 ENCOUNTER — Ambulatory Visit: Payer: Medicare Other | Admitting: Occupational Therapy

## 2017-10-05 DIAGNOSIS — M25641 Stiffness of right hand, not elsewhere classified: Secondary | ICD-10-CM

## 2017-10-05 DIAGNOSIS — I69315 Cognitive social or emotional deficit following cerebral infarction: Secondary | ICD-10-CM

## 2017-10-05 DIAGNOSIS — R278 Other lack of coordination: Secondary | ICD-10-CM

## 2017-10-05 DIAGNOSIS — I69351 Hemiplegia and hemiparesis following cerebral infarction affecting right dominant side: Secondary | ICD-10-CM | POA: Diagnosis not present

## 2017-10-05 DIAGNOSIS — M25611 Stiffness of right shoulder, not elsewhere classified: Secondary | ICD-10-CM

## 2017-10-05 DIAGNOSIS — R2689 Other abnormalities of gait and mobility: Secondary | ICD-10-CM | POA: Diagnosis not present

## 2017-10-05 DIAGNOSIS — R29818 Other symptoms and signs involving the nervous system: Secondary | ICD-10-CM

## 2017-10-05 DIAGNOSIS — R293 Abnormal posture: Secondary | ICD-10-CM | POA: Diagnosis not present

## 2017-10-05 DIAGNOSIS — M6281 Muscle weakness (generalized): Secondary | ICD-10-CM | POA: Diagnosis not present

## 2017-10-05 DIAGNOSIS — R2681 Unsteadiness on feet: Secondary | ICD-10-CM | POA: Diagnosis not present

## 2017-10-05 NOTE — Therapy (Signed)
[facility] [address] [place], [place], [address] Phone: [phone]   Fax:  [phone]  Occupational Therapy Treatment  Patient Details  Name: Kelly [name] MRN: [id number] Date of Birth: [birth year] Referring Provider: Dr Leonie Man   Encounter Date: 10/05/2017  OT End of Session - 10/05/17 1633    Visit Number  19    Number of Visits  25    Date for OT Re-Evaluation  10/22/17    Authorization Type  medicare    Authorization Time Period  60 days- renewed for 2x week for 4 weeks on 09/23/17, next week will be 1/4 weeks    OT Start Time  1533    OT Stop Time  1613    OT Time Calculation (min)  40 min    Activity Tolerance  Patient tolerated treatment well    Behavior During Therapy  [facility] for tasks assessed/performed       Past Medical History:  Diagnosis Date  . Acute blood loss anemia   . Adjustment disorder with depressed mood   . CVA (cerebral vascular accident) ([place]) 02/03/2016   Linear infarct within the posterior limb of left internal capsule  . Diastolic CHF ([place])   . Dysphagia   . Dysphonia 02/20/2013  . Essential and other specified forms of tremor 02/20/2013  . HLD (hyperlipidemia)   . HTN (hypertension)   . OAB (overactive bladder)   . Osteoporosis   . Patient receiving subcutaneous heparin    For DVT prophylaxis 9/17  . Prediabetes   . Psoriasis   . Slow transit constipation   . Stroke (cerebrum) ([place]) 02/01/2016  . Trigeminal neuralgia 02/20/2013    Past Surgical History:  Procedure Laterality Date  . APPENDECTOMY  1940  . bladder tack    . CATARACT EXTRACTION Bilateral   . HEMORRHOID SURGERY  1960  . HUMERUS FRACTURE SURGERY    . LAPAROSCOPIC HYSTERECTOMY  2006  . torn rotator cuff    . WRIST FRACTURE SURGERY  2005   seconday shoulder 2001    There were no vitals filed for this visit.  Subjective Assessment - 10/05/17 1629    Subjective   Patient indicated the brace was heavy - but  that it was helping  (afo)    Patient is accompained by:  Family member    Pertinent History  L MCA 01/2016, Multiple bilateral CVA's 03/2017, HTN, CHF    Currently in Pain?  No/denies    Pain Score  0-No pain                   OT Treatments/Exercises (OP) - 10/05/17 0001      Neurological Re-education Exercises   Other Exercises 1  Neuromuscular reeducation to address wrist and digit passive to active extension in right upper extremity.  Patient in supine able to relax overactive muscles to allow more isolated motion in wrist and digits- en mass, and as individual digits.  Patient unable to tolerate sidelying - "feels funny"  no indication oif pain - appears more perceptually challenging.  Patient able to grasp and release objects in right hand consistently this session.  Discussed potential for future botox injection to reduce right hand wrist flexor tone.  Patient sees physiatrist on Wed 5/15.                 OT Short Term Goals - 09/30/17 1720      OT SHORT TERM GOAL #1   Title  Patient  will complete a home exercise program designed to improve range of motion in right arm- with min prompting.  Due 08/25/17    Status  Achieved      OT SHORT TERM GOAL #2   Title  Patient will complete a home activity program designed to increase functional use of right UE with min prompting    Status  Achieved      OT SHORT TERM GOAL #3   Title  Patient will stand at counter and remove 2 lightweight items (silverware, lightweight saucer)  from lower rack of dishwasher with close supervision    Status  Achieved      OT SHORT TERM GOAL #4   Title  Patient will reach forward to to obtain a lightweight item (shirt) from lower surface while seated demonstrating 45 degrees of shoulder flexion with sufficient hand opening and grasp.      Status  Achieved      OT SHORT TERM GOAL #5   Title  Pt will be able to cut food using bilateral upper extremities and min assist    Status  Not Met  continue goal      OT SHORT TERM GOAL #6   Title  Patient will bring finger food to mouth once placed in right hand    Status  Achieved        OT Long Term Goals - 09/30/17 1720      OT LONG TERM GOAL #1   Title  Patient will complete an HEP designed to improve RUE AROM/PROM with mod I due 5/3    Status  Achieved      OT LONG TERM GOAL #2   Title  Patient will independently complete a home activity program designed to improve right UE functional ability - check 10/22/17    Status  On-going not fully addressed      OT LONG TERM GOAL #3   Title  Pt will be able to write name legibly with LUE and AE prn    Status  Achieved      OT LONG TERM GOAL #4   Title  Patient will load 2-3 lightweight items into dishwasher after each meal- using right upper extremity- check 10/22/17    Status  On-going Pt has not attempted      OT LONG TERM GOAL #5   Title  Patient will use RUE to wipe down countertops in kitchen after meals to assist with clean up     Status  Partially [place] #6   Title  Patient will wash lightweight dishes after meals using BUE's    Status  Not Met goal d/c      OT LONG TERM GOAL #7   Title  Patient will demonstrate ability to use right arm to open cupboard doors and drawers in her home check 10/22/17    Status  On-going Pt is not performing consistently      OT LONG TERM GOAL #8   Title  Pt will feed herself finger foods at least 50% of the time with RUE.- check 10/22/17    Time  4    Period  Weeks    Status  On-going      OT LONG TERM GOAL  #10   Time  4            Plan - 10/05/17 1638    Clinical Impression Statement  Pt with significant increased tension in right arm  which limits functional use of right hand / arm.      Occupational Profile and client history currently impacting functional performance  Wife, mother, fiercely independent prior to CVA's, active in church    Occupational performance deficits (Please refer to evaluation  for details):  ADL's;IADL's;Social Participation;Leisure    Rehab Potential  Good    OT Frequency  2x / week    OT Duration  4 weeks    OT Treatment/Interventions  Moist Heat;Fluidtherapy;DME and/or AE instruction;Splinting;Balance training;Therapeutic activities;Therapeutic exercise;Cognitive remediation/compensation;Neuromuscular education;Functional Mobility Training;Patient/family education;Manual Therapy;Paraffin;Electrical Stimulation;Self-care/ADL training    Plan  work towards unmet and updated goals, NMR/ functional use of RUE    Clinical Decision Making  Several treatment options, min-mod task modification necessary    OT Home Exercise Plan  Patient will bring prior HEP for review    Consulted and Agree with Plan of Care  Patient;Family member/caregiver    Family Member Consulted  husband       Patient will benefit from skilled therapeutic intervention in order to improve the following deficits and impairments:  Decreased coordination, Decreased range of motion, Difficulty walking, Impaired flexibility, Improper body mechanics, Decreased safety awareness, Increased edema, Impaired sensation, Decreased activity tolerance, Impaired tone, Decreased balance, Decreased knowledge of use of DME, Impaired UE functional use, Decreased cognition, Decreased mobility, Decreased strength, Impaired perceived functional ability  Visit Diagnosis: Muscle weakness (generalized)  Unsteadiness on feet  Abnormal posture  Spastic hemiplegia of right dominant side as late effect of cerebral infarction (HCC)  Stiffness of right hand, not elsewhere classified  Stiffness of right shoulder, not elsewhere classified  Other lack of coordination  Other symptoms and signs involving the nervous system  Cognitive social or emotional deficit following cerebral infarction    Problem List Patient Active Problem List   Diagnosis Date Noted  . Acute lower UTI   . Labile blood pressure   . Abnormal  urinalysis   . Trauma   . Closed compression fracture of L4 lumbar vertebra   . Neurogenic bladder   . History of hypertension   . Anemia of chronic disease   . Hypoalbuminemia due to protein-calorie malnutrition ([place])   . Acute bilateral cerebral infarction in a watershed distribution [facility]) 04/05/2017  . Benign essential HTN   . Chronic diastolic heart failure ([place])   . Right spastic hemiparesis ([place])   . History of stroke 02/16/2017  . Abnormality of gait following cerebrovascular accident (CVA) 02/16/2017  . Slow transit constipation   . Acute blood loss anemia   . Adjustment disorder with depressed mood   . Cerebrovascular accident (CVA) ([place]) 02/03/2016  . Dysarthria, post-stroke   . Dysphagia, post-stroke   . Leukocytosis   . Prediabetes   . Right hemiparesis ([place])   . Cerebral infarction due to unspecified mechanism   . Other secondary hypertension   . Overactive bladder   . Diastolic dysfunction   . Hyperlipidemia   . Essential hypertension   . Essential tremor   . Stroke (cerebrum) ([place]) 02/01/2016  . Facial droop due to stroke 02/01/2016  . Essential and other specified forms of tremor 02/20/2013  . Dysphonia 02/20/2013  . Trigeminal neuralgia 02/20/2013    Mariah Milling , OTR/L 10/05/2017, 4:43 PM  [facility] [address] [place], [place], [address] Phone: [phone]   Fax:  [phone]  Name: Kelly [name] MRN: [id number] Date of Birth: 1927/08/21

## 2017-10-07 ENCOUNTER — Ambulatory Visit: Payer: Medicare Other | Admitting: Occupational Therapy

## 2017-10-07 ENCOUNTER — Encounter: Payer: Self-pay | Admitting: Occupational Therapy

## 2017-10-07 DIAGNOSIS — R278 Other lack of coordination: Secondary | ICD-10-CM

## 2017-10-07 DIAGNOSIS — I69351 Hemiplegia and hemiparesis following cerebral infarction affecting right dominant side: Secondary | ICD-10-CM | POA: Diagnosis not present

## 2017-10-07 DIAGNOSIS — M25641 Stiffness of right hand, not elsewhere classified: Secondary | ICD-10-CM

## 2017-10-07 DIAGNOSIS — R293 Abnormal posture: Secondary | ICD-10-CM

## 2017-10-07 DIAGNOSIS — M6281 Muscle weakness (generalized): Secondary | ICD-10-CM | POA: Diagnosis not present

## 2017-10-07 DIAGNOSIS — R2689 Other abnormalities of gait and mobility: Secondary | ICD-10-CM | POA: Diagnosis not present

## 2017-10-07 DIAGNOSIS — M25611 Stiffness of right shoulder, not elsewhere classified: Secondary | ICD-10-CM

## 2017-10-07 DIAGNOSIS — R2681 Unsteadiness on feet: Secondary | ICD-10-CM | POA: Diagnosis not present

## 2017-10-07 DIAGNOSIS — I69315 Cognitive social or emotional deficit following cerebral infarction: Secondary | ICD-10-CM

## 2017-10-07 DIAGNOSIS — R29818 Other symptoms and signs involving the nervous system: Secondary | ICD-10-CM

## 2017-10-07 NOTE — Therapy (Signed)
[facility] [address] [place], [place], [address] Phone: [phone]   Fax:  [phone]  Occupational Therapy Treatment  Patient Details  Name: Kelly [name] MRN: [id number] Date of Birth: 1928/03/15 Referring Provider: Dr Leonie Man   Encounter Date: 10/07/2017  OT End of Session - 10/07/17 1640    Visit Number  20    Number of Visits  25    Date for OT Re-Evaluation  10/22/17    Authorization Type  medicare    Authorization Time Period  60 days- renewed for 2x week for 4 weeks on 09/23/17, next week will be 1/4 weeks    OT Start Time  1533    OT Stop Time  1617    OT Time Calculation (min)  44 min    Activity Tolerance  Patient tolerated treatment well    Behavior During Therapy  [facility] for tasks assessed/performed       Past Medical History:  Diagnosis Date  . Acute blood loss anemia   . Adjustment disorder with depressed mood   . CVA (cerebral vascular accident) ([place]) 02/03/2016   Linear infarct within the posterior limb of left internal capsule  . Diastolic CHF ([place])   . Dysphagia   . Dysphonia 02/20/2013  . Essential and other specified forms of tremor 02/20/2013  . HLD (hyperlipidemia)   . HTN (hypertension)   . OAB (overactive bladder)   . Osteoporosis   . Patient receiving subcutaneous heparin    For DVT prophylaxis 9/17  . Prediabetes   . Psoriasis   . Slow transit constipation   . Stroke (cerebrum) ([place]) 02/01/2016  . Trigeminal neuralgia 02/20/2013    Past Surgical History:  Procedure Laterality Date  . APPENDECTOMY  1940  . bladder tack    . CATARACT EXTRACTION Bilateral   . HEMORRHOID SURGERY  1960  . HUMERUS FRACTURE SURGERY    . LAPAROSCOPIC HYSTERECTOMY  2006  . torn rotator cuff    . WRIST FRACTURE SURGERY  2005   seconday shoulder 2001    There were no vitals filed for this visit.  Subjective Assessment - 10/07/17 1542    Subjective   It was raining so I couldn't watch tennis so  I got to walk in the park    Patient is accompained by:  Family member    Pertinent History  L MCA 01/2016, Multiple bilateral CVA's 03/2017, HTN, CHF    Currently in Pain?  No/denies    Pain Score  0-No pain         [facility] OT Assessment - 10/07/17 0001      Hand Function   Right Hand Lateral Pinch  5 lbs    Left Hand Grip (lbs)  20    Left Hand Lateral Pinch  9 lbs               OT Treatments/Exercises (OP) - 10/07/17 0001      Neurological Re-education Exercises   Other Exercises 1  Neuromuscular reeducation to address wrist and digit extension following passive stretch.  Patient has been faithfully wearing resting hand splint.  Discussed with patient and husband that plan will be to place patient on hold for two weeks after next MD appointment which is planned for Botox injection to right hand.      Other Exercises 2  Patient with imporved active wrist extension, and  active digit extension 2-5- however has difficulty overriding flexor tension.  Modalities   Modalities  Teacher, English as a foreign language Location  right forearm    Electrical Stimulation Action  NMES    Electrical Stimulation Parameters  8 MIN, intermittent, 9.0     Electrical Stimulation Goals  Neuromuscular facilitation;Tone             OT Education - 10/07/17 1639    Education provided  Yes    Education Details  Hold OT  3weeks to allow for next Botox injection- will plan to recertify for 4 more weeks following botox    Person(s) Educated  Patient;Spouse    Methods  Explanation    Comprehension  Verbalized understanding       OT Short Term Goals - 09/30/17 1720      OT SHORT TERM GOAL #1   Title  Patient will complete a home exercise program designed to improve range of motion in right arm- with min prompting.  Due 08/25/17    Status  Achieved      OT SHORT TERM GOAL #2   Title  Patient will complete a home activity program designed to  increase functional use of right UE with min prompting    Status  Achieved      OT SHORT TERM GOAL #3   Title  Patient will stand at counter and remove 2 lightweight items (silverware, lightweight saucer)  from lower rack of dishwasher with close supervision    Status  Achieved      OT SHORT TERM GOAL #4   Title  Patient will reach forward to to obtain a lightweight item (shirt) from lower surface while seated demonstrating 45 degrees of shoulder flexion with sufficient hand opening and grasp.      Status  Achieved      OT SHORT TERM GOAL #5   Title  Pt will be able to cut food using bilateral upper extremities and min assist    Status  Not Met continue goal      OT SHORT TERM GOAL #6   Title  Patient will bring finger food to mouth once placed in right hand    Status  Achieved        OT Long Term Goals - 09/30/17 1720      OT LONG TERM GOAL #1   Title  Patient will complete an HEP designed to improve RUE AROM/PROM with mod I due 5/3    Status  Achieved      OT LONG TERM GOAL #2   Title  Patient will independently complete a home activity program designed to improve right UE functional ability - check 10/22/17    Status  On-going not fully addressed      OT LONG TERM GOAL #3   Title  Pt will be able to write name legibly with LUE and AE prn    Status  Achieved      OT LONG TERM GOAL #4   Title  Patient will load 2-3 lightweight items into dishwasher after each meal- using right upper extremity- check 10/22/17    Status  On-going Pt has not attempted      OT LONG TERM GOAL #5   Title  Patient will use RUE to wipe down countertops in kitchen after meals to assist with clean up     Status  Partially [place] #6   Title  Patient will wash lightweight dishes after meals  using BUE's    Status  Not Met goal d/c      OT LONG TERM GOAL #7   Title  Patient will demonstrate ability to use right arm to open cupboard doors and drawers in her home check 10/22/17     Status  On-going Pt is not performing consistently      OT LONG TERM GOAL #8   Title  Pt will feed herself finger foods at least 50% of the time with RUE.- check 10/22/17    Time  4    Period  Weeks    Status  On-going      OT LONG TERM GOAL  #10   Time  4            Plan - 10/07/17 1640    Clinical Impression Statement  Patient has shown some improved passive motion in right hand, and active extension in hand and wrist - however functional use is limited by flexor tone in wrist and digits.      Occupational Profile and client history currently impacting functional performance  Wife, mother, fiercely independent prior to CVA's, active in church    Occupational performance deficits (Please refer to evaluation for details):  ADL's;IADL's;Social Participation;Leisure    Rehab Potential  Good    OT Frequency  2x / week    OT Duration  4 weeks    OT Treatment/Interventions  Moist Heat;Fluidtherapy;DME and/or AE instruction;Splinting;Balance training;Therapeutic activities;Therapeutic exercise;Cognitive remediation/compensation;Neuromuscular education;Functional Mobility Training;Patient/family education;Manual Therapy;Paraffin;Electrical Stimulation;Self-care/ADL training    Plan  hold until week of 6/5    Clinical Decision Making  Several treatment options, min-mod task modification necessary    OT Home Exercise Plan  Patient will bring prior HEP for review    Consulted and Agree with Plan of Care  Patient;Family member/caregiver       Patient will benefit from skilled therapeutic intervention in order to improve the following deficits and impairments:  Decreased coordination, Decreased range of motion, Difficulty walking, Impaired flexibility, Improper body mechanics, Decreased safety awareness, Increased edema, Impaired sensation, Decreased activity tolerance, Impaired tone, Decreased balance, Decreased knowledge of use of DME, Impaired UE functional use, Decreased cognition, Decreased  mobility, Decreased strength, Impaired perceived functional ability  Visit Diagnosis: Muscle weakness (generalized)  Spastic hemiplegia of right dominant side as late effect of cerebral infarction (HCC)  Unsteadiness on feet  Abnormal posture  Stiffness of right hand, not elsewhere classified  Stiffness of right shoulder, not elsewhere classified  Other lack of coordination  Other symptoms and signs involving the nervous system  Cognitive social or emotional deficit following cerebral infarction    Problem List Patient Active Problem List   Diagnosis Date Noted  . Acute lower UTI   . Labile blood pressure   . Abnormal urinalysis   . Trauma   . Closed compression fracture of L4 lumbar vertebra   . Neurogenic bladder   . History of hypertension   . Anemia of chronic disease   . Hypoalbuminemia due to protein-calorie malnutrition ([place])   . Acute bilateral cerebral infarction in a watershed distribution [facility]) 04/05/2017  . Benign essential HTN   . Chronic diastolic heart failure ([place])   . Right spastic hemiparesis ([place])   . History of stroke 02/16/2017  . Abnormality of gait following cerebrovascular accident (CVA) 02/16/2017  . Slow transit constipation   . Acute blood loss anemia   . Adjustment disorder with depressed mood   . Cerebrovascular accident (CVA) ([place]) 02/03/2016  . Dysarthria, post-stroke   .  Dysphagia, post-stroke   . Leukocytosis   . Prediabetes   . Right hemiparesis ([place])   . Cerebral infarction due to unspecified mechanism   . Other secondary hypertension   . Overactive bladder   . Diastolic dysfunction   . Hyperlipidemia   . Essential hypertension   . Essential tremor   . Stroke (cerebrum) ([place]) 02/01/2016  . Facial droop due to stroke 02/01/2016  . Essential and other specified forms of tremor 02/20/2013  . Dysphonia 02/20/2013  . Trigeminal neuralgia 02/20/2013    Mariah Milling , OTR/L 10/07/2017, 4:43 PM  [facility] [address] [place], [place], [address] Phone: [phone]   Fax:  [phone]  Name: Kelly [name] MRN: [id number] Date of Birth: 07-22-1927

## 2017-10-08 ENCOUNTER — Encounter: Payer: Self-pay | Admitting: Physical Therapy

## 2017-10-08 ENCOUNTER — Ambulatory Visit: Payer: Medicare Other | Admitting: Physical Therapy

## 2017-10-08 DIAGNOSIS — M25641 Stiffness of right hand, not elsewhere classified: Secondary | ICD-10-CM | POA: Diagnosis not present

## 2017-10-08 DIAGNOSIS — R2681 Unsteadiness on feet: Secondary | ICD-10-CM | POA: Diagnosis not present

## 2017-10-08 DIAGNOSIS — R2689 Other abnormalities of gait and mobility: Secondary | ICD-10-CM | POA: Diagnosis not present

## 2017-10-08 DIAGNOSIS — R293 Abnormal posture: Secondary | ICD-10-CM

## 2017-10-08 DIAGNOSIS — M6281 Muscle weakness (generalized): Secondary | ICD-10-CM

## 2017-10-08 DIAGNOSIS — I69351 Hemiplegia and hemiparesis following cerebral infarction affecting right dominant side: Secondary | ICD-10-CM | POA: Diagnosis not present

## 2017-10-09 NOTE — Therapy (Signed)
[facility] [address] [place], [place], [address] Phone: [phone]   Fax:  [phone]  Physical Therapy Treatment  Patient Details  Name: Kelly [name] MRN: [id number] Date of Birth: 1927/08/13 Referring Provider: Dr Leonie Man   Encounter Date: 10/08/2017  PT End of Session - 10/09/17 0849    Visit Number  19    Number of Visits  23    Date for PT Re-Evaluation  10/29/17    Authorization Type  [facility]    Authorization Time Period  07-26-17 - 09-24-17; 09-24-17 - 11-24-17    PT Start Time  1402    PT Stop Time  1445    PT Time Calculation (min)  43 min    Equipment Utilized During Treatment  Gait belt    Activity Tolerance  Patient tolerated treatment well    Behavior During Therapy  [facility] for tasks assessed/performed       Past Medical History:  Diagnosis Date  . Acute blood loss anemia   . Adjustment disorder with depressed mood   . CVA (cerebral vascular accident) ([place]) 02/03/2016   Linear infarct within the posterior limb of left internal capsule  . Diastolic CHF ([place])   . Dysphagia   . Dysphonia 02/20/2013  . Essential and other specified forms of tremor 02/20/2013  . HLD (hyperlipidemia)   . HTN (hypertension)   . OAB (overactive bladder)   . Osteoporosis   . Patient receiving subcutaneous heparin    For DVT prophylaxis 9/17  . Prediabetes   . Psoriasis   . Slow transit constipation   . Stroke (cerebrum) ([place]) 02/01/2016  . Trigeminal neuralgia 02/20/2013    Past Surgical History:  Procedure Laterality Date  . APPENDECTOMY  1940  . bladder tack    . CATARACT EXTRACTION Bilateral   . HEMORRHOID SURGERY  1960  . HUMERUS FRACTURE SURGERY    . LAPAROSCOPIC HYSTERECTOMY  2006  . torn rotator cuff    . WRIST FRACTURE SURGERY  2005   seconday shoulder 2001    There were no vitals filed for this visit.  Subjective Assessment - 10/08/17 1407    Subjective  Made it to the park and walked 400  steps!    Patient is accompained by:  Family member    Pertinent History  Rt MCA CVA on 02-03-16:  Cryptogenic CVA on 04-05-17; pt received OP PT at this facility from 06-01-16 - 11-19-16    Patient Stated Goals  "I would like to walk in the park"     Currently in Pain?  No/denies                       [facility] Adult PT Treatment/Exercise - 10/08/17 1429      Transfers   Transfers  Sit to Stand;Stand to Sit    Sit to Stand  4: Min guard;4: Min assist;Without upper extremity assist;From bed    Sit to Stand Details (indicate cue type and reason)  vc for sequence; assist with forward wt-shifting to incr success; did best with trying to reach forward past tehe front bar of her RW (both come to stand and to sit    Stand to Sit  4: Min guard;4: Min assist    Stand to Sit Details  assist with keeping weight anteriorly over her BOS    Number of Reps  10 reps;Other reps (comment) 5 reps      Ambulation/Gait   Ambulation/Gait Assistance  5: Supervision;4: Min assist    Ambulation/Gait Assistance Details  AFO RLE; tactile cues and occasional assist to maximize upright posture and looking forward    Ambulation Distance (Feet)  80 Feet 60,60    Assistive device  Rolling walker    Gait Pattern  Decreased hip/knee flexion - right;Decreased dorsiflexion - right;Decreased weight shift to right;Decreased step length - right    Ambulation Surface  Level;Indoor    Curb  2: Max assist;4: Min assist    Curb Details (indicate cue type and reason)  ascend with min assist (to place RW); descending with putting just front wheels down first, pt with LOB and was being held up by PT. Unable to calm pt and assure her she was not going to fall (she was very anxious); 2nd PT brought a chair and pt seated on top landing of curb to "regroup." Patient then agreed to attempt descending with 2 person assist. Entire RW lowered to next level and pt stepped down with min assist.      Knee/Hip Exercises: Seated   Sit to  Sand  15 reps;without UE support               PT Short Term Goals - 08/24/17 1457      PT SHORT TERM GOAL #1   Title  Pt will perform sit to stand 5 reps from mat without UE support to demo improved LE strength.    Baseline  Pt able to perform without UE support but stabilizes legs against mat table upon initial standing -08-24-17    Status  Partially Met      PT SHORT TERM GOAL #2   Title  Pt will receive consult for custom AFO for RLE.    Baseline  scheduled at [facility] on 07-27-17; not yet received -- 08-24-17    Status  On-going      PT SHORT TERM GOAL #3   Title  Pt will stand for at least 2" without UE support with SBA to increase independence with ADL's.    Baseline  met 08-24-17    Status  Achieved      PT SHORT TERM GOAL #4   Title  Incr. gait velocity to >/= .9 ft/sec with RW for incr. gait efficiency.    Baseline  .50 ft/sec with RW (no AFO used); 1.50 ft/sec with RW - 08-24-17    Status  Achieved      PT SHORT TERM GOAL #5   Title  Pt will amb. with RW 350' with SBA on flat, even surface.    Baseline  met 08-24-17    Status  Achieved        PT Long Term Goals - 09/23/17 1413      PT LONG TERM GOAL #1   Title  Pt will increase gait velocity from .54 ft/sec to >/= 1.2 ft/sec with RW with AFO on RLE for incr. gait efficiency.    Baseline  .78 ft/sec with RW= 31.16 secs on 09-23-17 = 1.05 ft/sec    Status  On-going    Target Date  10/25/17      PT LONG TERM GOAL #2   Title  Pt will stand for at least 10" with UE support prn and reach 6" outside BOS without LOB for incr. independence and safety with ADL's.    Baseline  able reach 5" anteriorly - 09-23-17    Status  Partially Met    Target Date  10/25/17  PT LONG TERM GOAL #3   Title  Pt will negotiate 4 steps with Lt hand rail with CGA using a step by step sequence.    Status  Achieved      PT LONG TERM GOAL #4   Title  Independent in HEP for RLE strengthening and balance exercises.    Status  Achieved       PT LONG TERM GOAL #5   Title  Pt will amb. 500' outside with RW with AFO on RLE with SBA to allow pt to return walking in the park per her stated goal.    Status  On-going    Target Date  10/25/17            Plan - 10/09/17 0851    Clinical Impression Statement  Patient continues with posterior lean/bias during transitional movements and worked on various strategies to incr her comfort and ability to shift forward over her BOS. Patient with great difficulty with descending curb (likely due to PT attempted different way to lower her RW down to next level). She required increased time to regain her composure and then agreed to attempt a second time with far better results. Discussed upcoming end of certification and decided pt has made sufficient progress and can benefit from an additional 4 weeks of PT. Patient made appoinitments and will plan to recertify when LTGs assessed.     Rehab Potential  Good    Clinical Impairments Affecting Rehab Potential  severity of deficits with h/o prior CVA with Rt hemiparesis    PT Frequency  2x / week    PT Duration  8 weeks    PT Treatment/Interventions  ADLs/Self Care Home Management;Stair training;Gait training;DME Instruction;Therapeutic activities;Therapeutic exercise;Balance training;Neuromuscular re-education;Patient/family education;Orthotic Fit/Training;Passive range of motion    PT Next Visit Plan  cont gait training - inside/outside as pt able to tolerate--goal 500 ft;; stair training (has been more difficult per husband); curb training, endurance activities, RLE strengthening and balance training; plan to recert x 4 weeks at LTG check (pt has already made appts)   PT Home Exercise Plan  see above    Consulted and Agree with Plan of Care  Patient;Family member/caregiver    Family Member Consulted  husband        Patient will benefit from skilled therapeutic intervention in order to improve the following deficits and impairments:  Abnormal gait,  Decreased endurance, Decreased activity tolerance, Decreased balance, Decreased coordination, Decreased range of motion, Decreased strength, Impaired tone, Impaired UE functional use, Postural dysfunction  Visit Diagnosis: Muscle weakness (generalized)  Unsteadiness on feet  Abnormal posture     Problem List Patient Active Problem List   Diagnosis Date Noted  . Acute lower UTI   . Labile blood pressure   . Abnormal urinalysis   . Trauma   . Closed compression fracture of L4 lumbar vertebra   . Neurogenic bladder   . History of hypertension   . Anemia of chronic disease   . Hypoalbuminemia due to protein-calorie malnutrition ([place])   . Acute bilateral cerebral infarction in a watershed distribution [facility]) 04/05/2017  . Benign essential HTN   . Chronic diastolic heart failure ([place])   . Right spastic hemiparesis ([place])   . History of stroke 02/16/2017  . Abnormality of gait following cerebrovascular accident (CVA) 02/16/2017  . Slow transit constipation   . Acute blood loss anemia   . Adjustment disorder with depressed mood   . Cerebrovascular accident (CVA) ([place]) 02/03/2016  . Dysarthria,  post-stroke   . Dysphagia, post-stroke   . Leukocytosis   . Prediabetes   . Right hemiparesis ([place])   . Cerebral infarction due to unspecified mechanism   . Other secondary hypertension   . Overactive bladder   . Diastolic dysfunction   . Hyperlipidemia   . Essential hypertension   . Essential tremor   . Stroke (cerebrum) ([place]) 02/01/2016  . Facial droop due to stroke 02/01/2016  . Essential and other specified forms of tremor 02/20/2013  . Dysphonia 02/20/2013  . Trigeminal neuralgia 02/20/2013    Rexanne Mano, PT 10/09/2017, 9:00 AM  [facility] [address] [place], [place], [address] Phone: [phone]   Fax:  [phone]  Name: AZUSENA ERLANDSON MRN: [id number] Date of Birth: 1927/09/03

## 2017-10-12 ENCOUNTER — Encounter: Payer: Self-pay | Admitting: Physical Therapy

## 2017-10-12 ENCOUNTER — Encounter: Payer: Medicare Other | Admitting: Occupational Therapy

## 2017-10-12 ENCOUNTER — Ambulatory Visit: Payer: Medicare Other | Admitting: Physical Therapy

## 2017-10-12 DIAGNOSIS — R2689 Other abnormalities of gait and mobility: Secondary | ICD-10-CM | POA: Diagnosis not present

## 2017-10-12 DIAGNOSIS — M6281 Muscle weakness (generalized): Secondary | ICD-10-CM | POA: Diagnosis not present

## 2017-10-12 DIAGNOSIS — R2681 Unsteadiness on feet: Secondary | ICD-10-CM | POA: Diagnosis not present

## 2017-10-12 DIAGNOSIS — M25641 Stiffness of right hand, not elsewhere classified: Secondary | ICD-10-CM | POA: Diagnosis not present

## 2017-10-12 DIAGNOSIS — I69351 Hemiplegia and hemiparesis following cerebral infarction affecting right dominant side: Secondary | ICD-10-CM | POA: Diagnosis not present

## 2017-10-12 DIAGNOSIS — R293 Abnormal posture: Secondary | ICD-10-CM | POA: Diagnosis not present

## 2017-10-12 NOTE — Therapy (Signed)
[facility] [address] [place], [place], [address] Phone: [phone]   Fax:  [phone]  Physical Therapy Treatment  Patient Details  Name: Kelly [name] MRN: [id number] Date of Birth: Nov 18, 1927 Referring Provider: Dr Leonie Man   Encounter Date: 10/12/2017  PT End of Session - 10/12/17 1737    Visit Number  20    Number of Visits  23    Date for PT Re-Evaluation  10/29/17    Authorization Type  [facility]    Authorization Time Period  07-26-17 - 09-24-17; 09-24-17 - 11-24-17    PT Start Time  1401    PT Stop Time  1447    PT Time Calculation (min)  46 min    Activity Tolerance  Patient tolerated treatment well    Behavior During Therapy  [facility] for tasks assessed/performed;Anxious anxious on curb and ramp       Past Medical History:  Diagnosis Date  . Acute blood loss anemia   . Adjustment disorder with depressed mood   . CVA (cerebral vascular accident) ([place]) 02/03/2016   Linear infarct within the posterior limb of left internal capsule  . Diastolic CHF ([place])   . Dysphagia   . Dysphonia 02/20/2013  . Essential and other specified forms of tremor 02/20/2013  . HLD (hyperlipidemia)   . HTN (hypertension)   . OAB (overactive bladder)   . Osteoporosis   . Patient receiving subcutaneous heparin    For DVT prophylaxis 9/17  . Prediabetes   . Psoriasis   . Slow transit constipation   . Stroke (cerebrum) ([place]) 02/01/2016  . Trigeminal neuralgia 02/20/2013    Past Surgical History:  Procedure Laterality Date  . APPENDECTOMY  1940  . bladder tack    . CATARACT EXTRACTION Bilateral   . HEMORRHOID SURGERY  1960  . HUMERUS FRACTURE SURGERY    . LAPAROSCOPIC HYSTERECTOMY  2006  . torn rotator cuff    . WRIST FRACTURE SURGERY  2005   seconday shoulder 2001    There were no vitals filed for this visit.  Subjective Assessment - 10/12/17 1358    Subjective  No changes. No more walking in the park yet.      Patient is accompained by:  Family member    Pertinent History  Rt MCA CVA on 02-03-16:  Cryptogenic CVA on 04-05-17; pt received OP PT at this facility from 06-01-16 - 11-19-16    Patient Stated Goals  "I would like to walk in the park"     Currently in Pain?  No/denies       Treatment-  Self-care- daughter present and inquiring over her concern for her mother's neck pain related to napping while sitting up in her w/c each afternoon. Her head hangs forward and to her right. Discussed greater concern for patient falling forward out of her chair and onto her head with catastrophic injuries possible. Patient unable to state her reason for sleeping in the chair vs lying down on the couch or bed (which would allow her time to lie supine with back extension and chest expansion). Conversation eventually led to her reporting she prefers to nap in the wheelchair because she can easily take herself to the bathroom if husband is outside. Husband reports he does not like for her to do this, however he feels she will try to go on her own and does not want her trying to walk by herself. Her using the w/c independently seemed like "the better  answer" to him. Patient denies urgency with getting to the restroom, therefore does not need to remain seated as she transitions to the bathroom (she can walk to bathroom with assistance and hold her urine without "accidents").   Educated on current risk of fall and severe injury. Discussed potentially purchasing a recliner that is better sized for pt and would allow her to elevate legs and recline for better positioning for reducing fall risk and improving posture and neck pain. Husband reports they have tried this in the past without success. Daughter inquired re: a reclining w/c, however concern for pt "sliding down" and out of chair onto floor (plus the increased weight of chair would make it difficult for her to mobilize the chair by herself). Current best answer is either, 1)  system for pt to notify husband of need for his assistance, 2) elevate pt's legs on the ottoman while seated in the w/c to minimize risk of falling forward.   Neuro re-ed-  In // bars: Tandem walking with single UE support; backwards walking single UE support x 4 lengths each; side stepping x 4 lengths In standing at walker, rt knee control while lifting left foot up/down onto 6" step. With max verbal and tactile cues, pt began to be able to prevent genu recurvatum ~30% of trials.   There-ex-standing hip flexion with 1# weight on RLE; hip abduction with 1# weight bil x 10 reps (holding onto RW)  Gait training-up single step/curb with RW with min assist but max encouragement due to anxiety from near fall when descending curb last visit. Pt did well stepping up and allowed her to walk down ramp instead of stepping down the curb. She was much more anxious/hesitant on ramp than on previous session.                         PT Education - 10/12/17 1736    Education Details  risk of falling forward out of her w/c when she sleeps in the chair (also detrimental to her neck and posture as reported her head hangs forward and down); possible option to find a recliner that is more her size     Person(s) Educated  Patient;Spouse;Child(ren)    Methods  Explanation    Comprehension  Verbalized understanding       PT Short Term Goals - 08/24/17 1457      PT SHORT TERM GOAL #1   Title  Pt will perform sit to stand 5 reps from mat without UE support to demo improved LE strength.    Baseline  Pt able to perform without UE support but stabilizes legs against mat table upon initial standing -08-24-17    Status  Partially Met      PT SHORT TERM GOAL #2   Title  Pt will receive consult for custom AFO for RLE.    Baseline  scheduled at [facility] on 07-27-17; not yet received -- 08-24-17    Status  On-going      PT SHORT TERM GOAL #3   Title  Pt will stand for at least 2" without UE support with  SBA to increase independence with ADL's.    Baseline  met 08-24-17    Status  Achieved      PT SHORT TERM GOAL #4   Title  Incr. gait velocity to >/= .9 ft/sec with RW for incr. gait efficiency.    Baseline  .54 ft/sec with RW (no AFO used); 1.50 ft/sec  with RW - 08-24-17    Status  Achieved      PT SHORT TERM GOAL #5   Title  Pt will amb. with RW 350' with SBA on flat, even surface.    Baseline  met 08-24-17    Status  Achieved        PT Long Term Goals - 09/23/17 1413      PT LONG TERM GOAL #1   Title  Pt will increase gait velocity from .54 ft/sec to >/= 1.2 ft/sec with RW with AFO on RLE for incr. gait efficiency.    Baseline  .79 ft/sec with RW= 31.16 secs on 09-23-17 = 1.05 ft/sec    Status  On-going    Target Date  10/25/17      PT LONG TERM GOAL #2   Title  Pt will stand for at least 10" with UE support prn and reach 6" outside BOS without LOB for incr. independence and safety with ADL's.    Baseline  able reach 5" anteriorly - 09-23-17    Status  Partially Met    Target Date  10/25/17      PT LONG TERM GOAL #3   Title  Pt will negotiate 4 steps with Lt hand rail with CGA using a step by step sequence.    Status  Achieved      PT LONG TERM GOAL #4   Title  Independent in HEP for RLE strengthening and balance exercises.    Status  Achieved      PT LONG TERM GOAL #5   Title  Pt will amb. 500' outside with RW with AFO on RLE with SBA to allow pt to return walking in the park per her stated goal.    Status  On-going    Target Date  10/25/17            Plan - 10/12/17 1739    Clinical Impression Statement  Session focused on anterior wt-shifting over her BOS during transfers to/from stand, rt knee control to prevent genu recurvatum, upright posture "to see what's coming" and avoid obstacles, strength and gait training. Daughter present with pt for first time and inquired re: pt's habit of napping while sitting up in her w/c. See Self care re: conversation and  recommendations. Will continue to work towards achieving her LTGs    Rehab Potential  Good    Clinical Impairments Affecting Rehab Potential  severity of deficits with h/o prior CVA with Rt hemiparesis    PT Frequency  2x / week    PT Duration  8 weeks    PT Treatment/Interventions  ADLs/Self Care Home Management;Stair training;Gait training;DME Instruction;Therapeutic activities;Therapeutic exercise;Balance training;Neuromuscular re-education;Patient/family education;Orthotic Fit/Training;Passive range of motion    PT Next Visit Plan  ?solution for "napping" in w/c during the day if not able to use ottoman or purchase a recliner; cont gait training - inside/outside as pt able to tolerate--goal 500 ft;; stair training (has been more difficult per husband); curb training, RLE strengthening and balance training    PT Home Exercise Plan  see above    Consulted and Agree with Plan of Care  Patient;Family member/caregiver    Family Member Consulted  husband, daughter       Patient will benefit from skilled therapeutic intervention in order to improve the following deficits and impairments:  Abnormal gait, Decreased endurance, Decreased activity tolerance, Decreased balance, Decreased coordination, Decreased range of motion, Decreased strength, Impaired tone, Impaired UE functional use, Postural dysfunction  Visit Diagnosis: Muscle weakness (generalized)  Unsteadiness on feet  Abnormal posture     Problem List Patient Active Problem List   Diagnosis Date Noted  . Acute lower UTI   . Labile blood pressure   . Abnormal urinalysis   . Trauma   . Closed compression fracture of L4 lumbar vertebra   . Neurogenic bladder   . History of hypertension   . Anemia of chronic disease   . Hypoalbuminemia due to protein-calorie malnutrition ([place])   . Acute bilateral cerebral infarction in a watershed distribution [facility]) 04/05/2017  . Benign essential HTN   . Chronic diastolic heart failure ([place])    . Right spastic hemiparesis ([place])   . History of stroke 02/16/2017  . Abnormality of gait following cerebrovascular accident (CVA) 02/16/2017  . Slow transit constipation   . Acute blood loss anemia   . Adjustment disorder with depressed mood   . Cerebrovascular accident (CVA) ([place]) 02/03/2016  . Dysarthria, post-stroke   . Dysphagia, post-stroke   . Leukocytosis   . Prediabetes   . Right hemiparesis ([place])   . Cerebral infarction due to unspecified mechanism   . Other secondary hypertension   . Overactive bladder   . Diastolic dysfunction   . Hyperlipidemia   . Essential hypertension   . Essential tremor   . Stroke (cerebrum) ([place]) 02/01/2016  . Facial droop due to stroke 02/01/2016  . Essential and other specified forms of tremor 02/20/2013  . Dysphonia 02/20/2013  . Trigeminal neuralgia 02/20/2013    Rexanne Mano, PT 10/12/2017, 5:47 PM  [facility] [address] [place], [place], [address] Phone: [phone]   Fax:  [phone]  Name: NAIOMI MUSTO MRN: [id number] Date of Birth: February 18, 1928

## 2017-10-13 ENCOUNTER — Encounter: Payer: Self-pay | Admitting: Physical Medicine & Rehabilitation

## 2017-10-13 ENCOUNTER — Encounter: Payer: Medicare Other | Attending: Physical Medicine & Rehabilitation | Admitting: Physical Medicine & Rehabilitation

## 2017-10-13 ENCOUNTER — Other Ambulatory Visit: Payer: Self-pay

## 2017-10-13 VITALS — BP 112/69 | HR 73 | Ht 60.0 in | Wt 95.0 lb

## 2017-10-13 DIAGNOSIS — M810 Age-related osteoporosis without current pathological fracture: Secondary | ICD-10-CM | POA: Insufficient documentation

## 2017-10-13 DIAGNOSIS — G8111 Spastic hemiplegia affecting right dominant side: Secondary | ICD-10-CM

## 2017-10-13 DIAGNOSIS — L409 Psoriasis, unspecified: Secondary | ICD-10-CM | POA: Insufficient documentation

## 2017-10-13 DIAGNOSIS — I5030 Unspecified diastolic (congestive) heart failure: Secondary | ICD-10-CM | POA: Insufficient documentation

## 2017-10-13 DIAGNOSIS — E785 Hyperlipidemia, unspecified: Secondary | ICD-10-CM | POA: Insufficient documentation

## 2017-10-13 DIAGNOSIS — G250 Essential tremor: Secondary | ICD-10-CM | POA: Diagnosis not present

## 2017-10-13 DIAGNOSIS — I69351 Hemiplegia and hemiparesis following cerebral infarction affecting right dominant side: Secondary | ICD-10-CM | POA: Diagnosis not present

## 2017-10-13 DIAGNOSIS — I110 Hypertensive heart disease with heart failure: Secondary | ICD-10-CM | POA: Diagnosis not present

## 2017-10-13 DIAGNOSIS — N3281 Overactive bladder: Secondary | ICD-10-CM | POA: Diagnosis not present

## 2017-10-13 DIAGNOSIS — F4321 Adjustment disorder with depressed mood: Secondary | ICD-10-CM | POA: Diagnosis not present

## 2017-10-13 DIAGNOSIS — R7303 Prediabetes: Secondary | ICD-10-CM | POA: Insufficient documentation

## 2017-10-13 NOTE — Progress Notes (Signed)
Botox: Procedure Note Patient Name: Kelly [name] DOB: Feb 28, 1928 MRN: [id number]  Date: 10/13/17  Procedure: Botulinum toxin administration Guidance: EMG Diagnosis: G81.11 Attending: Maryla Morrow, MD    Trade name: Botox (onabotulinumtoxinA)  Informed consent: Risks, benefits & options of the procedure are explained to the patient (and/or family). The patient elects to proceed with procedure. Risks include but are not limited to weakness, respiratory distress, dry mouth, ptosis, antibody formation, worsening of some areas of function. Benefits include decreased abnormal muscle tone, improved hygiene and positioning, decreased skin breakdown and, in some cases, decreased pain. Options include conservative management with oral antispasticity agents, phenol chemodenervation of nerve or at motor nerve branches. More invasive options include intrathecal balcofen adminstration for appropriate candidates. Surgical options may include tendon lengthening or transposition or, rarely, dorsal rhizotomy.   History/Physical Examination: 84 y.o. spastic hemiplegia affecting right dominant side after left internal capsule CVA.  mAS  Right elbow flexors 1/4   Right wrist flexors 2/4   Right finger flexors 2/4 Previous Treatments: Failed Baclofen.  Therapy/Range of motion Indication for guidance: Target active muscules  Procedure: Botulinum toxin was mixed with preservative free saline with a dilution of 1cc to 100 units. Targeted limb and muscles were identified. The skin was prepped with alcohol swabs and placement of needle tip in targeted muscle was confirmed using appropriate guidance. Prior to injection, positioning of needle tip outside of blood vessel was determined by pulling back on syringe plunger.  MUSCLE UNITS: Right Biceps 50U Right FCR 50U Right FDS 80U Right FDP 20U  Total units used: 200  Complications: None Plan: Follow up in 6 weeks for Botox reeval Therapies to begin in 3  weeks  [name]  12:23 PM

## 2017-10-15 ENCOUNTER — Encounter: Payer: Medicare Other | Admitting: Occupational Therapy

## 2017-10-15 ENCOUNTER — Encounter: Payer: Self-pay | Admitting: Physical Therapy

## 2017-10-15 ENCOUNTER — Ambulatory Visit: Payer: Medicare Other | Admitting: Physical Therapy

## 2017-10-15 DIAGNOSIS — I69351 Hemiplegia and hemiparesis following cerebral infarction affecting right dominant side: Secondary | ICD-10-CM | POA: Diagnosis not present

## 2017-10-15 DIAGNOSIS — M25641 Stiffness of right hand, not elsewhere classified: Secondary | ICD-10-CM | POA: Diagnosis not present

## 2017-10-15 DIAGNOSIS — M6281 Muscle weakness (generalized): Secondary | ICD-10-CM

## 2017-10-15 DIAGNOSIS — R2681 Unsteadiness on feet: Secondary | ICD-10-CM | POA: Diagnosis not present

## 2017-10-15 DIAGNOSIS — R2689 Other abnormalities of gait and mobility: Secondary | ICD-10-CM

## 2017-10-15 DIAGNOSIS — R293 Abnormal posture: Secondary | ICD-10-CM | POA: Diagnosis not present

## 2017-10-15 NOTE — Therapy (Signed)
[facility] [address] [place], [place], [address] Phone: [phone]   Fax:  [phone]  Physical Therapy Treatment  Patient Details  Name: Kelly [name] MRN: [id number] Date of Birth: 1928-03-13 Referring Provider: Dr Leonie Man   Encounter Date: 10/15/2017  PT End of Session - 10/15/17 1636    Visit Number  21    Number of Visits  23    Date for PT Re-Evaluation  10/29/17    Authorization Type  [facility]    Authorization Time Period  07-26-17 - 09-24-17; 09-24-17 - 11-24-17    PT Start Time  1452    PT Stop Time  1537    PT Time Calculation (min)  45 min    Equipment Utilized During Treatment  Gait belt    Activity Tolerance  Patient tolerated treatment well    Behavior During Therapy  [facility] for tasks assessed/performed;Anxious anxious on ramp       Past Medical History:  Diagnosis Date  . Acute blood loss anemia   . Adjustment disorder with depressed mood   . CVA (cerebral vascular accident) ([place]) 02/03/2016   Linear infarct within the posterior limb of left internal capsule  . Diastolic CHF ([place])   . Dysphagia   . Dysphonia 02/20/2013  . Essential and other specified forms of tremor 02/20/2013  . HLD (hyperlipidemia)   . HTN (hypertension)   . OAB (overactive bladder)   . Osteoporosis   . Patient receiving subcutaneous heparin    For DVT prophylaxis 9/17  . Prediabetes   . Psoriasis   . Slow transit constipation   . Stroke (cerebrum) ([place]) 02/01/2016  . Trigeminal neuralgia 02/20/2013    Past Surgical History:  Procedure Laterality Date  . APPENDECTOMY  1940  . bladder tack    . CATARACT EXTRACTION Bilateral   . HEMORRHOID SURGERY  1960  . HUMERUS FRACTURE SURGERY    . LAPAROSCOPIC HYSTERECTOMY  2006  . torn rotator cuff    . WRIST FRACTURE SURGERY  2005   seconday shoulder 2001    There were no vitals filed for this visit.  Subjective Assessment - 10/15/17 1625    Subjective  Got her shot  for her right hand this week. Can already see it relaxing and opening better. Doesn't want to change the way she uses her w/c at home and is not afraid she will fall forward. (Husband agrees he is not concerned she will fall forward when she "dozes off.") Denies neck pain and isn't concerned about her posture from sitting in the w/c as she can still lie flat with one pillow. Husband states she probably uses the w/c too much when he is available to help her walk (she agrees) and they commit to work on walking more at home.     Patient is accompained by:  Family member    Pertinent History  Rt MCA CVA on 02-03-16:  Cryptogenic CVA on 04-05-17; pt received OP PT at this facility from 06-01-16 - 11-19-16    Patient Stated Goals  "I would like to walk in the park"     Currently in Pain?  No/denies        Treatment- Gait training with emphasis on upright posture and looking forward to plan for avoiding objects and be able to greet people in church. 80 ft, 120 ft,   Therex/balance training- sideways walking at counter; forwards tandem with normal backwards; standing hip abduction with 2 sec hold; seated hip  flexion with abdct (alternating legs), supine bridges with LEs on red physioball (pt ultimately able to control ball/balance while bridging)                       PT Education - 10/15/17 1640    Education Details  Need to continue to use long sock under AFO--especially in summer (pt wearing ankle socks)    Person(s) Educated  Patient;Spouse    Methods  Explanation    Comprehension  Verbalized understanding;Need further instruction       PT Short Term Goals - 08/24/17 1457      PT SHORT TERM GOAL #1   Title  Pt will perform sit to stand 5 reps from mat without UE support to demo improved LE strength.    Baseline  Pt able to perform without UE support but stabilizes legs against mat table upon initial standing -08-24-17    Status  Partially Met      PT SHORT TERM GOAL #2    Title  Pt will receive consult for custom AFO for RLE.    Baseline  scheduled at [facility] on 07-27-17; not yet received -- 08-24-17    Status  On-going      PT SHORT TERM GOAL #3   Title  Pt will stand for at least 2" without UE support with SBA to increase independence with ADL's.    Baseline  met 08-24-17    Status  Achieved      PT SHORT TERM GOAL #4   Title  Incr. gait velocity to >/= .9 ft/sec with RW for incr. gait efficiency.    Baseline  .71 ft/sec with RW (no AFO used); 1.50 ft/sec with RW - 08-24-17    Status  Achieved      PT SHORT TERM GOAL #5   Title  Pt will amb. with RW 350' with SBA on flat, even surface.    Baseline  met 08-24-17    Status  Achieved        PT Long Term Goals - 09/23/17 1413      PT LONG TERM GOAL #1   Title  Pt will increase gait velocity from .54 ft/sec to >/= 1.2 ft/sec with RW with AFO on RLE for incr. gait efficiency.    Baseline  .61 ft/sec with RW= 31.16 secs on 09-23-17 = 1.05 ft/sec    Status  On-going    Target Date  10/25/17      PT LONG TERM GOAL #2   Title  Pt will stand for at least 10" with UE support prn and reach 6" outside BOS without LOB for incr. independence and safety with ADL's.    Baseline  able reach 5" anteriorly - 09-23-17    Status  Partially Met    Target Date  10/25/17      PT LONG TERM GOAL #3   Title  Pt will negotiate 4 steps with Lt hand rail with CGA using a step by step sequence.    Status  Achieved      PT LONG TERM GOAL #4   Title  Independent in HEP for RLE strengthening and balance exercises.    Status  Achieved      PT LONG TERM GOAL #5   Title  Pt will amb. 500' outside with RW with AFO on RLE with SBA to allow pt to return walking in the park per her stated goal.    Status  On-going  Target Date  10/25/17            Plan - 10/15/17 1637    Clinical Impression Statement  Initial part of session re-visited the safety issue her daughter brought up last session (napping in w/c during the day).  Pt/husband both adamant they are not worried about it. Reiterated the concern for falling forward with potential brain injury or broken neck. They are aware of the risk and do not want to change anything. Continue to work on balance, strength, and gait training. Patient continues with gluteus medius and maximus weakness impacting her stability in standing.    Rehab Potential  Good    Clinical Impairments Affecting Rehab Potential  severity of deficits with h/o prior CVA with Rt hemiparesis    PT Frequency  2x / week    PT Duration  8 weeks    PT Treatment/Interventions  ADLs/Self Care Home Management;Stair training;Gait training;DME Instruction;Therapeutic activities;Therapeutic exercise;Balance training;Neuromuscular re-education;Patient/family education;Orthotic Fit/Training;Passive range of motion    PT Next Visit Plan  check LTGs and do recert by 6/3; cont gait training for endurance - inside/outside as pt able to tolerate--goal 500 ft to be able to walk into church;; stair training (has been more difficult per husband); curb training (try on actual curb outside);, RLE strengthening and balance training    PT Home Exercise Plan  see above    Consulted and Agree with Plan of Care  Patient;Family member/caregiver    Family Member Consulted  husband       Patient will benefit from skilled therapeutic intervention in order to improve the following deficits and impairments:  Abnormal gait, Decreased endurance, Decreased activity tolerance, Decreased balance, Decreased coordination, Decreased range of motion, Decreased strength, Impaired tone, Impaired UE functional use, Postural dysfunction  Visit Diagnosis: Muscle weakness (generalized)  Unsteadiness on feet  Other abnormalities of gait and mobility     Problem List Patient Active Problem List   Diagnosis Date Noted  . Right spastic hemiplegia ([place]) 10/13/2017  . Acute lower UTI   . Labile blood pressure   . Abnormal urinalysis   .  Trauma   . Closed compression fracture of L4 lumbar vertebra   . Neurogenic bladder   . History of hypertension   . Anemia of chronic disease   . Hypoalbuminemia due to protein-calorie malnutrition ([place])   . Acute bilateral cerebral infarction in a watershed distribution [facility]) 04/05/2017  . Benign essential HTN   . Chronic diastolic heart failure ([place])   . Right spastic hemiparesis ([place])   . History of stroke 02/16/2017  . Abnormality of gait following cerebrovascular accident (CVA) 02/16/2017  . Slow transit constipation   . Acute blood loss anemia   . Adjustment disorder with depressed mood   . Cerebrovascular accident (CVA) ([place]) 02/03/2016  . Dysarthria, post-stroke   . Dysphagia, post-stroke   . Leukocytosis   . Prediabetes   . Right hemiparesis ([place])   . Cerebral infarction due to unspecified mechanism   . Other secondary hypertension   . Overactive bladder   . Diastolic dysfunction   . Hyperlipidemia   . Essential hypertension   . Essential tremor   . Stroke (cerebrum) ([place]) 02/01/2016  . Facial droop due to stroke 02/01/2016  . Essential and other specified forms of tremor 02/20/2013  . Dysphonia 02/20/2013  . Trigeminal neuralgia 02/20/2013    Rexanne Mano, PT 10/15/2017, 4:46 PM  [facility] [address] [place], [place], [address]  Phone: [phone]   Fax:  [phone]  Name: Kelly [name] MRN: [id number] Date of Birth: [birth year]

## 2017-10-19 ENCOUNTER — Encounter: Payer: Self-pay | Admitting: Physical Therapy

## 2017-10-19 ENCOUNTER — Ambulatory Visit: Payer: Medicare Other | Admitting: Physical Therapy

## 2017-10-19 ENCOUNTER — Encounter: Payer: Medicare Other | Admitting: Occupational Therapy

## 2017-10-19 DIAGNOSIS — M25641 Stiffness of right hand, not elsewhere classified: Secondary | ICD-10-CM | POA: Diagnosis not present

## 2017-10-19 DIAGNOSIS — R2689 Other abnormalities of gait and mobility: Secondary | ICD-10-CM | POA: Diagnosis not present

## 2017-10-19 DIAGNOSIS — R2681 Unsteadiness on feet: Secondary | ICD-10-CM

## 2017-10-19 DIAGNOSIS — I69351 Hemiplegia and hemiparesis following cerebral infarction affecting right dominant side: Secondary | ICD-10-CM | POA: Diagnosis not present

## 2017-10-19 DIAGNOSIS — R293 Abnormal posture: Secondary | ICD-10-CM | POA: Diagnosis not present

## 2017-10-19 DIAGNOSIS — M6281 Muscle weakness (generalized): Secondary | ICD-10-CM | POA: Diagnosis not present

## 2017-10-20 NOTE — Therapy (Signed)
[facility] [address] [name], [place], [address] Phone: [phone]   Fax:  [phone]  Physical Therapy Treatment  Patient Details  Name: Kelly [name] MRN: [id number] Date of Birth: Mar 14, 1928 Referring Provider: Dr Leonie Man   Encounter Date: 10/19/2017  PT End of Session - 10/20/17 0753    Visit Number  22    Number of Visits  23    Date for PT Re-Evaluation  10/29/17    Authorization Type  [facility]    Authorization Time Period  07-26-17 - 09-24-17; 09-24-17 - 11-24-17    PT Start Time  1403    PT Stop Time  1445    PT Time Calculation (min)  42 min    Equipment Utilized During Treatment  --    Activity Tolerance  Patient tolerated treatment well    Behavior During Therapy  [facility] for tasks assessed/performed;Anxious anxious on steps       Past Medical History:  Diagnosis Date  . Acute blood loss anemia   . Adjustment disorder with depressed mood   . CVA (cerebral vascular accident) ([place]) 02/03/2016   Linear infarct within the posterior limb of left internal capsule  . Diastolic CHF ([place])   . Dysphagia   . Dysphonia 02/20/2013  . Essential and other specified forms of tremor 02/20/2013  . HLD (hyperlipidemia)   . HTN (hypertension)   . OAB (overactive bladder)   . Osteoporosis   . Patient receiving subcutaneous heparin    For DVT prophylaxis 9/17  . Prediabetes   . Psoriasis   . Slow transit constipation   . Stroke (cerebrum) ([place]) 02/01/2016  . Trigeminal neuralgia 02/20/2013    Past Surgical History:  Procedure Laterality Date  . APPENDECTOMY  1940  . bladder tack    . CATARACT EXTRACTION Bilateral   . HEMORRHOID SURGERY  1960  . HUMERUS FRACTURE SURGERY    . LAPAROSCOPIC HYSTERECTOMY  2006  . torn rotator cuff    . WRIST FRACTURE SURGERY  2005   seconday shoulder 2001    There were no vitals filed for this visit.  Subjective Assessment - 10/19/17 1407    Subjective  Didn't make it to  the park this weekend.     Patient is accompained by:  Family member    Pertinent History  Rt MCA CVA on 02-03-16:  Cryptogenic CVA on 04-05-17; pt received OP PT at this facility from 06-01-16 - 11-19-16    Patient Stated Goals  "I would like to walk in the park"                        [facility] Adult PT Treatment/Exercise - 10/19/17 1416      Transfers   Transfers  Sit to Stand;Stand to Sit    Sit to Stand  4: Min guard;With upper extremity assist;From bed;From chair/3-in-1    Sit to Stand Details (indicate cue type and reason)  minguard for safety due to tendency to push posterioly as coming to stand; vc for ant wt-shift    Stand to Sit  4: Min guard      Ambulation/Gait   Ambulation/Gait Assistance  5: Supervision    Ambulation/Gait Assistance Details  much improved upright posture without requiring cues; supervision for safety    Ambulation Distance (Feet)  100 Feet 80    Assistive device  Rolling walker    Gait Pattern  Decreased hip/knee flexion - right;Decreased dorsiflexion - right;Decreased  weight shift to right;Decreased step length - right;Right genu recurvatum;Trunk flexed    Ambulation Surface  Level    Gait velocity  32.8/21.6=1.52    Stairs  Yes    Stairs Assistance  4: Min assist    Stairs Assistance Details (indicate cue type and reason)  ascend left rail step over step; descend step to with rail on her left side-increased posterior lean as trying to advance RLE     Stair Management Technique  One rail Right;One rail Left;Alternating pattern;Step to pattern;Forwards    Number of Stairs  4    Height of Stairs  6      High Level Balance   High Level Balance Activities  Side stepping;Tandem walking    High Level Balance Comments  in // bars; also standing and reaching >6inches anteriorly with LUE with RUE for gross support           Balance Exercises - 10/20/17 0751      Balance Exercises: Standing   Standing Eyes Opened  Wide (BOA);Narrow base of  support (BOS);Head turns;Solid surface    Standing Eyes Closed  Wide (BOA);Solid surface          PT Short Term Goals - 08/24/17 1457      PT SHORT TERM GOAL #1   Title  Pt will perform sit to stand 5 reps from mat without UE support to demo improved LE strength.    Baseline  Pt able to perform without UE support but stabilizes legs against mat table upon initial standing -08-24-17    Status  Partially Met      PT SHORT TERM GOAL #2   Title  Pt will receive consult for custom AFO for RLE.    Baseline  scheduled at [facility] on 07-27-17; not yet received -- 08-24-17    Status  On-going      PT SHORT TERM GOAL #3   Title  Pt will stand for at least 2" without UE support with SBA to increase independence with ADL's.    Baseline  met 08-24-17    Status  Achieved      PT SHORT TERM GOAL #4   Title  Incr. gait velocity to >/= .9 ft/sec with RW for incr. gait efficiency.    Baseline  .38 ft/sec with RW (no AFO used); 1.50 ft/sec with RW - 08-24-17    Status  Achieved      PT SHORT TERM GOAL #5   Title  Pt will amb. with RW 350' with SBA on flat, even surface.    Baseline  met 08-24-17    Status  Achieved        PT Long Term Goals - 10/19/17 1444      PT LONG TERM GOAL #1   Title  Pt will increase gait velocity from .54 ft/sec to >/= 1.2 ft/sec with RW with AFO on RLE for incr. gait efficiency.    Baseline  .80 ft/sec with RW= 31.16 secs on 09-23-17 = 1.05 ft/sec; 5/28 1.52 ft/sec (32.8 ft/21.6 sec)    Status  Achieved      PT LONG TERM GOAL #2   Title  Pt will stand for at least 10" with UE support prn and reach 6" outside BOS without LOB for incr. independence and safety with ADL's.    Baseline  able reach 5" anteriorly - 09-23-17; 10/19/17 met    Status  Achieved      PT LONG TERM GOAL #3   Title  Pt will negotiate 4 steps with Lt hand rail with CGA using a step by step sequence.    Baseline  10/19/17 minguard assist to ascend step over step; min assist to descend step to pattern     Status  Partially Met      PT LONG TERM GOAL #4   Title  Independent in HEP for RLE strengthening and balance exercises.    Status  Achieved      PT LONG TERM GOAL #5   Title  Pt will amb. 500' outside with RW with AFO on RLE with SBA to allow pt to return walking in the park per her stated goal.    Status  On-going            Plan - 10/20/17 0754    Clinical Impression Statement  Session included balance and gait training (especially focus on stair training as husband reports this continues to be more difficult--especially descending). Began to assess LTGs with pt meeting 2 of the 3 goals assessed with 3rd goal partially met (stair goal-progress made but not to goal level). Will plan to assess remainder of LTGs next visit and request recertification for additional 4 weeks to continue to address unmet goals.     Rehab Potential  Good    Clinical Impairments Affecting Rehab Potential  severity of deficits with h/o prior CVA with Rt hemiparesis    PT Frequency  2x / week    PT Duration  8 weeks    PT Treatment/Interventions  ADLs/Self Care Home Management;Stair training;Gait training;DME Instruction;Therapeutic activities;Therapeutic exercise;Balance training;Neuromuscular re-education;Patient/family education;Orthotic Fit/Training;Passive range of motion    PT Next Visit Plan  check LTGs and do recert by 6/3; cont gait training for endurance - inside/outside as pt able to tolerate--goal 500 ft to be able to walk into church;; stair training (has been more difficult per husband); curb training (try on actual curb outside);, RLE strengthening and balance training    PT Home Exercise Plan  see above    Consulted and Agree with Plan of Care  Patient;Family member/caregiver    Family Member Consulted  husband       Patient will benefit from skilled therapeutic intervention in order to improve the following deficits and impairments:  Abnormal gait, Decreased endurance, Decreased activity  tolerance, Decreased balance, Decreased coordination, Decreased range of motion, Decreased strength, Impaired tone, Impaired UE functional use, Postural dysfunction  Visit Diagnosis: Muscle weakness (generalized)  Unsteadiness on feet  Other abnormalities of gait and mobility     Problem List Patient Active Problem List   Diagnosis Date Noted  . Right spastic hemiplegia ([place]) 10/13/2017  . Acute lower UTI   . Labile blood pressure   . Abnormal urinalysis   . Trauma   . Closed compression fracture of L4 lumbar vertebra   . Neurogenic bladder   . History of hypertension   . Anemia of chronic disease   . Hypoalbuminemia due to protein-calorie malnutrition ([place])   . Acute bilateral cerebral infarction in a watershed distribution [facility]) 04/05/2017  . Benign essential HTN   . Chronic diastolic heart failure ([place])   . Right spastic hemiparesis ([place])   . History of stroke 02/16/2017  . Abnormality of gait following cerebrovascular accident (CVA) 02/16/2017  . Slow transit constipation   . Acute blood loss anemia   . Adjustment disorder with depressed mood   . Cerebrovascular accident (CVA) ([place]) 02/03/2016  . Dysarthria, post-stroke   . Dysphagia, post-stroke   . Leukocytosis   .  Prediabetes   . Right hemiparesis ([place])   . Cerebral infarction due to unspecified mechanism   . Other secondary hypertension   . Overactive bladder   . Diastolic dysfunction   . Hyperlipidemia   . Essential hypertension   . Essential tremor   . Stroke (cerebrum) ([place]) 02/01/2016  . Facial droop due to stroke 02/01/2016  . Essential and other specified forms of tremor 02/20/2013  . Dysphonia 02/20/2013  . Trigeminal neuralgia 02/20/2013    Rexanne Mano, PT 10/20/2017, 8:00 AM  [facility] [address] [place], [place], [address] Phone: [phone]   Fax:  [phone]  Name: Kelly [name] MRN: [id number] Date of Birth:  07/24/1927

## 2017-10-22 ENCOUNTER — Encounter: Payer: Medicare Other | Admitting: Occupational Therapy

## 2017-10-22 ENCOUNTER — Encounter: Payer: Self-pay | Admitting: Physical Therapy

## 2017-10-22 ENCOUNTER — Ambulatory Visit: Payer: Medicare Other | Admitting: Physical Therapy

## 2017-10-22 DIAGNOSIS — I69351 Hemiplegia and hemiparesis following cerebral infarction affecting right dominant side: Secondary | ICD-10-CM | POA: Diagnosis not present

## 2017-10-22 DIAGNOSIS — M6281 Muscle weakness (generalized): Secondary | ICD-10-CM | POA: Diagnosis not present

## 2017-10-22 DIAGNOSIS — M25641 Stiffness of right hand, not elsewhere classified: Secondary | ICD-10-CM | POA: Diagnosis not present

## 2017-10-22 DIAGNOSIS — R2681 Unsteadiness on feet: Secondary | ICD-10-CM | POA: Diagnosis not present

## 2017-10-22 DIAGNOSIS — R293 Abnormal posture: Secondary | ICD-10-CM | POA: Diagnosis not present

## 2017-10-22 DIAGNOSIS — R2689 Other abnormalities of gait and mobility: Secondary | ICD-10-CM

## 2017-10-22 NOTE — Therapy (Addendum)
[place] [address] [place], [place], [address] Phone: [phone]   Fax:  [phone]  Physical Therapy Treatment  Patient Details  Name: Kelly [name] MRN: [id number] Date of Birth: 1927-11-15 Referring Provider: Dr Leonie Man   Encounter Date: 10/22/2017  PT End of Session - 10/22/17 1954    Visit Number  23    Number of Visits  31    Date for PT Re-Evaluation  11/19/17    Authorization Type  Medicare/[facility]    Authorization Time Period  07-26-17 - 09-24-17; 09-24-17 - 11-24-17; 10-22-17 to 01/20/2018    PT Start Time  1405    PT Stop Time  1445    PT Time Calculation (min)  40 min    Activity Tolerance  Patient tolerated treatment well    Behavior During Therapy  [facility] for tasks assessed/performed anxious on steps       Past Medical History:  Diagnosis Date  . Acute blood loss anemia   . Adjustment disorder with depressed mood   . CVA (cerebral vascular accident) ([place]) 02/03/2016   Linear infarct within the posterior limb of left internal capsule  . Diastolic CHF ([place])   . Dysphagia   . Dysphonia 02/20/2013  . Essential and other specified forms of tremor 02/20/2013  . HLD (hyperlipidemia)   . HTN (hypertension)   . OAB (overactive bladder)   . Osteoporosis   . Patient receiving subcutaneous heparin    For DVT prophylaxis 9/17  . Prediabetes   . Psoriasis   . Slow transit constipation   . Stroke (cerebrum) ([place]) 02/01/2016  . Trigeminal neuralgia 02/20/2013    Past Surgical History:  Procedure Laterality Date  . APPENDECTOMY  1940  . bladder tack    . CATARACT EXTRACTION Bilateral   . HEMORRHOID SURGERY  1960  . HUMERUS FRACTURE SURGERY    . LAPAROSCOPIC HYSTERECTOMY  2006  . torn rotator cuff    . WRIST FRACTURE SURGERY  2005   seconday shoulder 2001    There were no vitals filed for this visit.  Subjective Assessment - 10/22/17 1415    Subjective  Rt foot hurting as walking in. (Found cherry pit  in her shoe). As right hand has relaxed from botox, the position of her rt thumb on RW is uncomfortable.     Patient is accompained by:  Family member    Pertinent History  Rt MCA CVA on 02-03-16:  Cryptogenic CVA on 04-05-17; pt received OP PT at this facility from 06-01-16 - 11-19-16    Patient Stated Goals  "I would like to walk in the park"     Currently in Pain?  No/denies                       [facility] Adult PT Treatment/Exercise - 10/22/17 1438      Transfers   Transfers  Sit to Stand;Stand to Sit    Sit to Stand  4: Min guard;4: Min assist;With upper extremity assist    Sit to Stand Details (indicate cue type and reason)  minguard for safety due to tendency to push posterioly as coming to stand; vc for ant wt-shift    Stand to Sit  4: Min guard      Ambulation/Gait   Ambulation/Gait Assistance  5: Supervision    Ambulation/Gait Assistance Details  vc for upright posture; superveison for safety    Ambulation Distance (Feet)  446 Feet  Assistive device  Rolling walker    Gait Pattern  Decreased hip/knee flexion - right;Decreased dorsiflexion - right;Decreased weight shift to right;Decreased step length - right;Right genu recurvatum;Trunk flexed    Ambulation Surface  Indoor very hot outside, therefore assessed indoors    Pre-Gait Activities  Prior to assessing remaining LTGs, assessed rt hand position on RW. attempted walker splint (on clinic walker) and pt unable to open thumb enough. Added foam tubing (cut open to slip over handle of RW) and pt reported improvement. Attached to her RW using coban.              PT Education - 10/22/17 1951    Education Details  results of LTGs ; new goals   Person(s) Educated  Patient;Spouse    Methods  Explanation    Comprehension  Verbalized understanding       PT Short Term Goals - 08/24/17 1457      PT SHORT TERM GOAL #1   Title  Pt will perform sit to stand 5 reps from mat without UE support to demo improved LE  strength.    Baseline  Pt able to perform without UE support but stabilizes legs against mat table upon initial standing -08-24-17    Status  Partially Met      PT SHORT TERM GOAL #2   Title  Pt will receive consult for custom AFO for RLE.    Baseline  scheduled at [place] on 07-27-17; not yet received -- 08-24-17    Status  On-going      PT SHORT TERM GOAL #3   Title  Pt will stand for at least 2" without UE support with SBA to increase independence with ADL's.    Baseline  met 08-24-17    Status  Achieved      PT SHORT TERM GOAL #4   Title  Incr. gait velocity to >/= .9 ft/sec with RW for incr. gait efficiency.    Baseline  .65 ft/sec with RW (no AFO used); 1.50 ft/sec with RW - 08-24-17    Status  Achieved      PT SHORT TERM GOAL #5   Title  Pt will amb. with RW 350' with SBA on flat, even surface.    Baseline  met 08-24-17    Status  Achieved           PT Long Term Goals - 10/22/17 2001      PT LONG TERM GOAL #1   Title  Pt will increase gait velocity from .54 ft/sec to >/= 1.2 ft/sec with RW with AFO on RLE for incr. gait efficiency.    Baseline  .53 ft/sec with RW= 31.16 secs on 09-23-17 = 1.05 ft/sec; 5/28 1.52 ft/sec (32.8 ft/21.6 sec)    Status  Achieved      PT LONG TERM GOAL #2   Title  Pt will stand for at least 10" with UE support prn and reach 6" outside BOS without LOB for incr. independence and safety with ADL's.    Baseline  able reach 5" anteriorly - 09-23-17; 10/19/17 met    Status  Achieved      PT LONG TERM GOAL #3   Title  Pt will negotiate 4 steps with Lt hand rail with CGA using a step by step sequence.    Baseline  10/19/17 minguard assist to ascend step over step; min assist to descend step to pattern    Status  Partially Met      PT LONG  TERM GOAL #4   Title  Independent in HEP for RLE strengthening and balance exercises.    Status  Achieved      PT LONG TERM GOAL #5   Title  Pt will amb. 500' outside with RW with AFO on RLE with SBA to allow pt to return  walking in the park per her stated goal.    Baseline  5/31 446 ft (indoors due to incr temp outdoors)    Status  Partially Met      Additional Long Term Goals   Additional Long Term Goals  Yes      PT LONG TERM GOAL #6   Title  Patient will increase gait velocity to >= 1.81 ft/sec (threshold for lower fall risk) (Target for new LTGs 11/19/17)    Baseline  5/28  1.52 ft/sec    Time  4    Period  Weeks    Status  New      PT LONG TERM GOAL #7   Title  Patient will ambulate 500 ft in <6 minutes over level indoor or outdoor surfaces (dependent on weather) with close supervision to allow walking into church and to classroom.     Baseline  5/31  468f    Time  4    Period  Weeks    Status  New      PT LONG TERM GOAL #8   Title  Patient will be able to maintain >50 steps/min on seated stepper x 15 minutes to demonstrate improved muscular endurance.    Time  4    Period  Weeks    Status  New         Plan - 10/22/17 2008    Clinical Impression Statement  Remainder of LTGs assessed with pt overall meeting 3 of 5 goals and partially meeting 2 of 5 goals (improved but not to goal level). Majority of session spent attempting 500 ft ambulation goal (due to pt's slow velocity). Patient's personal goal of walking in the park has also been partially met (she has walked in the park x 1, but has not been back). Anticipate patient can continue to benefit from PT to improve her muscular endurance to allow incr ambulaiton distace.     Rehab Potential  Good    Clinical Impairments Affecting Rehab Potential  severity of deficits with h/o prior CVA with Rt hemiparesis    PT Frequency  2x / week    PT Duration  4 weeks  (Pended)     PT Treatment/Interventions  ADLs/Self Care Home Management;Stair training;Gait training;DME Instruction;Therapeutic activities;Therapeutic exercise;Balance training;Neuromuscular re-education;Patient/family education;Orthotic Fit/Training;Passive range of motion    PT Next  Visit Plan  cont gait training for endurance - inside/outside as pt able to tolerate--goal 500 ft to be able to walk into church; Nustep for incr endurance; stair training (has been more difficult per husband); curb training (try on actual curb outside);, RLE strengthening and balance training  (Pended)     PT Home Exercise Plan  see above    Consulted and Agree with Plan of Care  Patient;Family member/caregiver    Family Member Consulted  husband             Patient will benefit from skilled therapeutic intervention in order to improve the following deficits and impairments:  Abnormal gait, Decreased endurance, Decreased activity tolerance, Decreased balance, Decreased coordination, Decreased range of motion, Decreased strength, Impaired tone, Impaired UE functional use, Postural dysfunction  Visit Diagnosis: Muscle weakness (generalized)  Unsteadiness on feet  Other abnormalities of gait and mobility  Abnormal posture     Problem List Patient Active Problem List   Diagnosis Date Noted  . Right spastic hemiplegia ([place]) 10/13/2017  . Acute lower UTI   . Labile blood pressure   . Abnormal urinalysis   . Trauma   . Closed compression fracture of L4 lumbar vertebra   . Neurogenic bladder   . History of hypertension   . Anemia of chronic disease   . Hypoalbuminemia due to protein-calorie malnutrition ([place])   . Acute bilateral cerebral infarction in a watershed distribution [facility]) 04/05/2017  . Benign essential HTN   . Chronic diastolic heart failure ([place])   . Right spastic hemiparesis ([place])   . History of stroke 02/16/2017  . Abnormality of gait following cerebrovascular accident (CVA) 02/16/2017  . Slow transit constipation   . Acute blood loss anemia   . Adjustment disorder with depressed mood   . Cerebrovascular accident (CVA) ([place]) 02/03/2016  . Dysarthria, post-stroke   . Dysphagia, post-stroke   . Leukocytosis   . Prediabetes   . Right hemiparesis ([place])   .  Cerebral infarction due to unspecified mechanism   . Other secondary hypertension   . Overactive bladder   . Diastolic dysfunction   . Hyperlipidemia   . Essential hypertension   . Essential tremor   . Stroke (cerebrum) ([place]) 02/01/2016  . Facial droop due to stroke 02/01/2016  . Essential and other specified forms of tremor 02/20/2013  . Dysphonia 02/20/2013  . Trigeminal neuralgia 02/20/2013    Rexanne Mano, PT 10/22/2017, 8:17 PM  [place] [address] [place], [place], [address] Phone: [phone]   Fax:  [phone]  Name: Kelly [name] MRN: [id number] Date of Birth: [birth year]

## 2017-10-23 NOTE — Addendum Note (Signed)
Addended by: Zena AmosSASSER, Chidi Shirer P on: 10/23/2017 09:07 AM   Modules accepted: Orders

## 2017-10-27 ENCOUNTER — Encounter: Payer: Self-pay | Admitting: Occupational Therapy

## 2017-10-27 ENCOUNTER — Ambulatory Visit: Payer: Medicare Other | Attending: Neurology | Admitting: Occupational Therapy

## 2017-10-27 DIAGNOSIS — I69315 Cognitive social or emotional deficit following cerebral infarction: Secondary | ICD-10-CM | POA: Insufficient documentation

## 2017-10-27 DIAGNOSIS — M25611 Stiffness of right shoulder, not elsewhere classified: Secondary | ICD-10-CM | POA: Insufficient documentation

## 2017-10-27 DIAGNOSIS — R2681 Unsteadiness on feet: Secondary | ICD-10-CM | POA: Diagnosis not present

## 2017-10-27 DIAGNOSIS — M25641 Stiffness of right hand, not elsewhere classified: Secondary | ICD-10-CM | POA: Diagnosis not present

## 2017-10-27 DIAGNOSIS — R2689 Other abnormalities of gait and mobility: Secondary | ICD-10-CM | POA: Insufficient documentation

## 2017-10-27 DIAGNOSIS — R293 Abnormal posture: Secondary | ICD-10-CM | POA: Insufficient documentation

## 2017-10-27 DIAGNOSIS — R29818 Other symptoms and signs involving the nervous system: Secondary | ICD-10-CM | POA: Diagnosis not present

## 2017-10-27 DIAGNOSIS — I69351 Hemiplegia and hemiparesis following cerebral infarction affecting right dominant side: Secondary | ICD-10-CM | POA: Diagnosis not present

## 2017-10-27 DIAGNOSIS — R278 Other lack of coordination: Secondary | ICD-10-CM | POA: Insufficient documentation

## 2017-10-27 DIAGNOSIS — M6281 Muscle weakness (generalized): Secondary | ICD-10-CM | POA: Insufficient documentation

## 2017-10-27 NOTE — Therapy (Signed)
[place] [address] [place], [place], [address] Phone: [phone]   Fax:  [phone]  Occupational Therapy Treatment  Patient Details  Kelly [name]: Kelly Kelly [name] MRN: [id number] Date of Birth: 02-Sep-1927 Referring Provider: Dr Kelly Kelly [name]   Encounter Date: 10/27/2017  OT End of Session - 10/27/17 1720    Visit Number  21    Number of Visits  29    Date for OT Re-Evaluation  11/21/17    Authorization Type  medicare    Authorization Time Period  60 days- renewed for 2x week for 4 weeks on 09/23/17, next week will be 1/4 weeks    OT Start Time  1533    OT Stop Time  1617    OT Time Calculation (min)  44 min    Activity Tolerance  Patient tolerated treatment well    Behavior During Therapy  [facility] for tasks assessed/performed       Past Medical History:  Diagnosis Date  . Acute blood loss anemia   . Adjustment disorder with depressed mood   . CVA (cerebral vascular accident) ([place]) 02/03/2016   Linear infarct within the posterior limb of left internal capsule  . Diastolic CHF ([place])   . Dysphagia   . Dysphonia 02/20/2013  . Essential and other specified forms of tremor 02/20/2013  . HLD (hyperlipidemia)   . HTN (hypertension)   . OAB (overactive bladder)   . Osteoporosis   . Patient receiving subcutaneous heparin    For DVT prophylaxis 9/17  . Prediabetes   . Psoriasis   . Slow transit constipation   . Stroke (cerebrum) ([place]) 02/01/2016  . Trigeminal neuralgia 02/20/2013    Past Surgical History:  Procedure Laterality Date  . APPENDECTOMY  1940  . bladder tack    . CATARACT EXTRACTION Bilateral   . HEMORRHOID SURGERY  1960  . HUMERUS FRACTURE SURGERY    . LAPAROSCOPIC HYSTERECTOMY  2006  . torn rotator cuff    . WRIST FRACTURE SURGERY  2005   seconday shoulder 2001    There were no vitals filed for this visit.  Subjective Assessment - 10/27/17 1714    Subjective   I did not realize that my hand would be so  open    Patient is accompained by:  Family member    Pertinent History  L MCA 01/2016, Multiple bilateral CVA's 03/2017, HTN, CHF    Currently in Pain?  No/denies    Pain Score  0-No pain                   OT Treatments/Exercises (OP) - 10/27/17 0001      Neurological Re-education Exercises   Other Exercises 1  Quick reassessment to determine the impact of recent BOTOX injection.  Patient with very limited active digit flexion in 2nd and 3rd digit (index and long)   Patient initially with no perceptible PIP joint flexion in index finger.  Patient upset as she was having difficulty keeping her hand on the walker, and patient's husband and his son have been trying to problem solve ways to help.      Other Exercises 2  Patient with fairly significant sensory loss, and right inattention.  Feel patient's decreased hand use more due to this - and we discussed the importance of focusing on right hand when hand on walker.  Patient unable to maintain contact with the walker with built up handle - removed this and patient able to walk in gym  with right hand on grip..  Worked on blocking exercises to increase PIP flexion, and mass grasp, digit / wrist extension.  Patient able to utilize balanced flexion and extension to turn over playing cards.  Patient and husband encourgaed about use of hand on walker, and hand movement at the end of the session.               OT Education - 10/27/17 1719    Education provided  Yes    Education Details  importance of paying attention to right hand when attempting to use it - on the walker.  Discussed with husband the need to get up at noght to assist her to the bathroom until safer use of walker    Person(s) Educated  Patient;Spouse    Methods  Explanation;Demonstration    Comprehension  Verbalized understanding;Need further instruction       OT Short Term Goals - 10/27/17 1723      OT SHORT TERM GOAL #1   Title  Patient will complete a home  exercise program designed to improve range of motion in right arm- with min prompting.  Due 08/25/17    Status  Achieved      OT SHORT TERM GOAL #2   Title  Patient will complete a home activity program designed to increase functional use of right UE with min prompting    Status  Achieved      OT SHORT TERM GOAL #3   Title  Patient will stand at counter and remove 2 lightweight items (silverware, lightweight saucer)  from lower rack of dishwasher with close supervision    Status  Achieved      OT SHORT TERM GOAL #4   Title  Patient will reach forward to to obtain a lightweight item (shirt) from lower surface while seated demonstrating 45 degrees of shoulder flexion with sufficient hand opening and grasp.      Status  Achieved      OT SHORT TERM GOAL #5   Title  Pt will be able to cut food using bilateral upper extremities and min assist    Status  Not Met      OT SHORT TERM GOAL #6   Title  Patient will bring finger food to mouth once placed in right hand    Status  Achieved        OT Long Term Goals - 10/27/17 1724      OT LONG TERM GOAL #1   Title  Patient will complete an HEP designed to improve RUE AROM/PROM with mod I due 5/3    Status  Achieved      OT LONG TERM GOAL #2   Title  Patient will independently complete a home activity program designed to improve right UE functional ability - check 10/22/17    Status  On-going      OT LONG TERM GOAL #3   Title  Pt will be able to write Kelly [name] legibly with LUE and AE prn    Status  Achieved      OT LONG TERM GOAL #4   Title  Patient will load 2-3 lightweight items into dishwasher after each meal- using right upper extremity- check 10/22/17    Status  On-going      OT LONG TERM GOAL #5   Title  Patient will use RUE to wipe down countertops in kitchen after meals to assist with clean up     Status  On-going  OT LONG TERM GOAL #6   Title  Patient will wash lightweight dishes after meals using BUE's    Status  On-going       OT LONG TERM GOAL #7   Title  Patient will demonstrate ability to use right arm to open cupboard doors and drawers in her home check 10/22/17    Status  On-going      OT LONG TERM GOAL #8   Title  Pt will feed herself finger foods at least 50% of the time with RUE.- check 10/22/17    Status  On-going      OT LONG TERM GOAL  #9   Baseline  Pt will be able to manipulate 1 inch object in R hand with at least 75% accuracy.    Status  On-going            Plan - 10/27/17 1722    Clinical Impression Statement  Patient has returned for additional OT following Botox injection to elbow, wrist and hand to further improve functional use of RUE.      Occupational Profile and client history currently impacting functional performance  Wife, mother, fiercely independent prior to CVA's, active in church    Occupational performance deficits (Please refer to evaluation for details):  ADL's;IADL's;Social Participation;Leisure    Rehab Potential  Good    OT Frequency  2x / week    OT Duration  4 weeks    OT Treatment/Interventions  Moist Heat;Fluidtherapy;DME and/or AE instruction;Splinting;Balance training;Therapeutic activities;Therapeutic exercise;Cognitive remediation/compensation;Neuromuscular education;Functional Mobility Training;Patient/family education;Manual Therapy;Paraffin;Electrical Stimulation;Self-care/ADL training    Plan  Develop HEP for balanced flexion and extension in wrist and digits, functional use of right  hand, CHECK WALKER SAFETY    Clinical Decision Making  Several treatment options, min-mod task modification necessary    Consulted and Agree with Plan of Care  Patient;Family member/caregiver    Family Member Consulted  husband       Patient will benefit from skilled therapeutic intervention in order to improve the following deficits and impairments:  Decreased coordination, Decreased range of motion, Difficulty walking, Impaired flexibility, Improper body mechanics, Decreased  safety awareness, Increased edema, Impaired sensation, Decreased activity tolerance, Impaired tone, Decreased balance, Decreased knowledge of use of DME, Impaired UE functional use, Decreased cognition, Decreased mobility, Decreased strength, Impaired perceived functional ability  Visit Diagnosis: Spastic hemiplegia of right dominant side as late effect of cerebral infarction (HCC)  Muscle weakness (generalized)  Unsteadiness on feet  Abnormal posture  Stiffness of right hand, not elsewhere classified  Stiffness of right shoulder, not elsewhere classified  Other lack of coordination  Other symptoms and signs involving the nervous system  Cognitive social or emotional deficit following cerebral infarction    Problem List Patient Active Problem List   Diagnosis Date Noted  . Right spastic hemiplegia ([place]) 10/13/2017  . Acute lower UTI   . Labile blood pressure   . Abnormal urinalysis   . Trauma   . Closed compression fracture of L4 lumbar vertebra   . Neurogenic bladder   . History of hypertension   . Anemia of chronic disease   . Hypoalbuminemia due to protein-calorie malnutrition ([place])   . Acute bilateral cerebral infarction in a watershed distribution [facility]) 04/05/2017  . Benign essential HTN   . Chronic diastolic heart failure ([place])   . Right spastic hemiparesis ([place])   . History of stroke 02/16/2017  . Abnormality of gait following cerebrovascular accident (CVA) 02/16/2017  . Slow transit constipation   .  Acute blood loss anemia   . Adjustment disorder with depressed mood   . Cerebrovascular accident (CVA) ([place]) 02/03/2016  . Dysarthria, post-stroke   . Dysphagia, post-stroke   . Leukocytosis   . Prediabetes   . Right hemiparesis ([place])   . Cerebral infarction due to unspecified mechanism   . Other secondary hypertension   . Overactive bladder   . Diastolic dysfunction   . Hyperlipidemia   . Essential hypertension   . Essential tremor   . Stroke (cerebrum)  ([place]) 02/01/2016  . Facial droop due to stroke 02/01/2016  . Essential and other specified forms of tremor 02/20/2013  . Dysphonia 02/20/2013  . Trigeminal neuralgia 02/20/2013    Mariah Milling, OTR/L 10/27/2017, 5:25 PM  [facility] [address] [place], [place], [address] Phone: [phone]   Fax:  [phone]  Kelly [name]: Kelly Kelly [name] MRN: [id number] Date of Birth: 1927-08-15

## 2017-10-28 ENCOUNTER — Encounter: Payer: Medicare Other | Admitting: Occupational Therapy

## 2017-10-29 ENCOUNTER — Encounter: Payer: Self-pay | Admitting: Physical Therapy

## 2017-10-29 ENCOUNTER — Ambulatory Visit: Payer: Medicare Other | Admitting: Occupational Therapy

## 2017-10-29 ENCOUNTER — Encounter: Payer: Self-pay | Admitting: Occupational Therapy

## 2017-10-29 ENCOUNTER — Ambulatory Visit: Payer: Medicare Other | Admitting: Physical Therapy

## 2017-10-29 DIAGNOSIS — M6281 Muscle weakness (generalized): Secondary | ICD-10-CM

## 2017-10-29 DIAGNOSIS — R29818 Other symptoms and signs involving the nervous system: Secondary | ICD-10-CM

## 2017-10-29 DIAGNOSIS — M25641 Stiffness of right hand, not elsewhere classified: Secondary | ICD-10-CM

## 2017-10-29 DIAGNOSIS — R2689 Other abnormalities of gait and mobility: Secondary | ICD-10-CM

## 2017-10-29 DIAGNOSIS — R2681 Unsteadiness on feet: Secondary | ICD-10-CM | POA: Diagnosis not present

## 2017-10-29 DIAGNOSIS — M25611 Stiffness of right shoulder, not elsewhere classified: Secondary | ICD-10-CM | POA: Diagnosis not present

## 2017-10-29 DIAGNOSIS — I69351 Hemiplegia and hemiparesis following cerebral infarction affecting right dominant side: Secondary | ICD-10-CM | POA: Diagnosis not present

## 2017-10-29 DIAGNOSIS — R293 Abnormal posture: Secondary | ICD-10-CM | POA: Diagnosis not present

## 2017-10-29 NOTE — Therapy (Signed)
[facility] [address] [place], [place], [address] Phone: [phone]   Fax:  [phone]  Occupational Therapy Treatment  Patient Details  Name: Kelly [name] MRN: [id number] Date of Birth: Sep 07, 1927 Referring Provider: Dr Leonie Man   Encounter Date: 10/29/2017  OT End of Session - 10/29/17 1548    Visit Number  22    Number of Visits  29    Date for OT Re-Evaluation  11/21/17    Authorization Type  medicare    Authorization Time Period  60 days- renewed for 2x week for 4 weeks on 09/23/17, next week will be 1/4 weeks    OT Start Time  1401    OT Stop Time  1445    OT Time Calculation (min)  44 min    Activity Tolerance  Patient tolerated treatment well    Behavior During Therapy  [facility] for tasks assessed/performed       Past Medical History:  Diagnosis Date  . Acute blood loss anemia   . Adjustment disorder with depressed mood   . CVA (cerebral vascular accident) ([place]) 02/03/2016   Linear infarct within the posterior limb of left internal capsule  . Diastolic CHF ([place])   . Dysphagia   . Dysphonia 02/20/2013  . Essential and other specified forms of tremor 02/20/2013  . HLD (hyperlipidemia)   . HTN (hypertension)   . OAB (overactive bladder)   . Osteoporosis   . Patient receiving subcutaneous heparin    For DVT prophylaxis 9/17  . Prediabetes   . Psoriasis   . Slow transit constipation   . Stroke (cerebrum) ([place]) 02/01/2016  . Trigeminal neuralgia 02/20/2013    Past Surgical History:  Procedure Laterality Date  . APPENDECTOMY  1940  . bladder tack    . CATARACT EXTRACTION Bilateral   . HEMORRHOID SURGERY  1960  . HUMERUS FRACTURE SURGERY    . LAPAROSCOPIC HYSTERECTOMY  2006  . torn rotator cuff    . WRIST FRACTURE SURGERY  2005   seconday shoulder 2001    There were no vitals filed for this visit.  Subjective Assessment - 10/29/17 1409    Subjective   I am so thrilled that I had confidence of my  hand on the walker, I took a walk in the park.    Pertinent History  L MCA 01/2016, Multiple bilateral CVA's 03/2017, HTN, CHF    Currently in Pain?  No/denies    Pain Score  0-No pain                   OT Treatments/Exercises (OP) - 10/29/17 0001      Neurological Re-education Exercises   Other Exercises 1  Blocking exercises to address IP flexion in 2nd and 3rd digit for opposition.  Patient now able to pick up, release, and stack 2 blocks with right hand.  Discussed patient using right hand to feed self firm finger foods - eg apple slices, carrot sticks, celery, etc.      Other Exercises 2  Patient with incorrect activation in thumb, and reduced thumb web space avaialble.  Patient may benefit from wearing resting hand splint during day - not at noght as she needs to get up frequently to use the bathroom.  Patient will bring splint in next session so we can check fit.        Splinting   Splinting  Assessed neoprene thumb spica splint and oval 8 (10) to attempt to increase thumb  IP extension.  CMC splint helped for functional grasp, release, but made it more challenging for patient to grasp walker, so will not use this splint.                 OT Short Term Goals - 10/27/17 1723      OT SHORT TERM GOAL #1   Title  Patient will complete a home exercise program designed to improve range of motion in right arm- with min prompting.  Due 08/25/17    Status  Achieved      OT SHORT TERM GOAL #2   Title  Patient will complete a home activity program designed to increase functional use of right UE with min prompting    Status  Achieved      OT SHORT TERM GOAL #3   Title  Patient will stand at counter and remove 2 lightweight items (silverware, lightweight saucer)  from lower rack of dishwasher with close supervision    Status  Achieved      OT SHORT TERM GOAL #4   Title  Patient will reach forward to to obtain a lightweight item (shirt) from lower surface while seated  demonstrating 45 degrees of shoulder flexion with sufficient hand opening and grasp.      Status  Achieved      OT SHORT TERM GOAL #5   Title  Pt will be able to cut food using bilateral upper extremities and min assist    Status  Not Met      OT SHORT TERM GOAL #6   Title  Patient will bring finger food to mouth once placed in right hand    Status  Achieved        OT Long Term Goals - 10/27/17 1724      OT LONG TERM GOAL #1   Title  Patient will complete an HEP designed to improve RUE AROM/PROM with mod I due 5/3    Status  Achieved      OT LONG TERM GOAL #2   Title  Patient will independently complete a home activity program designed to improve right UE functional ability - check 10/22/17    Status  On-going      OT LONG TERM GOAL #3   Title  Pt will be able to write name legibly with LUE and AE prn    Status  Achieved      OT LONG TERM GOAL #4   Title  Patient will load 2-3 lightweight items into dishwasher after each meal- using right upper extremity- check 10/22/17    Status  On-going      OT LONG TERM GOAL #5   Title  Patient will use RUE to wipe down countertops in kitchen after meals to assist with clean up     Status  On-going      OT LONG TERM GOAL #6   Title  Patient will wash lightweight dishes after meals using BUE's    Status  On-going      OT LONG TERM GOAL #7   Title  Patient will demonstrate ability to use right arm to open cupboard doors and drawers in her home check 10/22/17    Status  On-going      OT LONG TERM GOAL #8   Title  Pt will feed herself finger foods at least 50% of the time with RUE.- check 10/22/17    Status  On-going      OT LONG TERM GOAL  #9  Baseline  Pt will be able to manipulate 1 inch object in R hand with at least 75% accuracy.    Status  On-going            Plan - 10/29/17 1548    Clinical Impression Statement  Patient with more active extension in right digits - so better ability to grasp and release lightweight  objects.      Occupational Profile and client history currently impacting functional performance  Wife, mother, fiercely independent prior to CVA's, active in church    Occupational performance deficits (Please refer to evaluation for details):  ADL's;IADL's;Social Participation;Leisure    Rehab Potential  Good    OT Frequency  2x / week    OT Duration  4 weeks    OT Treatment/Interventions  Moist Heat;Fluidtherapy;DME and/or AE instruction;Splinting;Balance training;Therapeutic activities;Therapeutic exercise;Cognitive remediation/compensation;Neuromuscular education;Functional Mobility Training;Patient/family education;Manual Therapy;Paraffin;Electrical Stimulation;Self-care/ADL training    Plan  Develop HEP for balanced flexion and extension in wrist and digits, functional use of right  hand, CHECK past splint    Clinical Decision Making  Several treatment options, min-mod task modification necessary    OT Home Exercise Plan  Patient will bring prior HEP for review    Consulted and Agree with Plan of Care  Patient;Family member/caregiver    Family Member Consulted  husband       Patient will benefit from skilled therapeutic intervention in order to improve the following deficits and impairments:  Decreased coordination, Decreased range of motion, Difficulty walking, Impaired flexibility, Improper body mechanics, Decreased safety awareness, Increased edema, Impaired sensation, Decreased activity tolerance, Impaired tone, Decreased balance, Decreased knowledge of use of DME, Impaired UE functional use, Decreased cognition, Decreased mobility, Decreased strength, Impaired perceived functional ability  Visit Diagnosis: Muscle weakness (generalized)  Unsteadiness on feet  Other symptoms and signs involving the nervous system  Spastic hemiplegia of right dominant side as late effect of cerebral infarction (HCC)  Stiffness of right hand, not elsewhere classified    Problem List Patient  Active Problem List   Diagnosis Date Noted  . Right spastic hemiplegia ([place]) 10/13/2017  . Acute lower UTI   . Labile blood pressure   . Abnormal urinalysis   . Trauma   . Closed compression fracture of L4 lumbar vertebra   . Neurogenic bladder   . History of hypertension   . Anemia of chronic disease   . Hypoalbuminemia due to protein-calorie malnutrition ([place])   . Acute bilateral cerebral infarction in a watershed distribution [facility]) 04/05/2017  . Benign essential HTN   . Chronic diastolic heart failure ([place])   . Right spastic hemiparesis ([place])   . History of stroke 02/16/2017  . Abnormality of gait following cerebrovascular accident (CVA) 02/16/2017  . Slow transit constipation   . Acute blood loss anemia   . Adjustment disorder with depressed mood   . Cerebrovascular accident (CVA) ([place]) 02/03/2016  . Dysarthria, post-stroke   . Dysphagia, post-stroke   . Leukocytosis   . Prediabetes   . Right hemiparesis ([place])   . Cerebral infarction due to unspecified mechanism   . Other secondary hypertension   . Overactive bladder   . Diastolic dysfunction   . Hyperlipidemia   . Essential hypertension   . Essential tremor   . Stroke (cerebrum) ([place]) 02/01/2016  . Facial droop due to stroke 02/01/2016  . Essential and other specified forms of tremor 02/20/2013  . Dysphonia 02/20/2013  . Trigeminal neuralgia 02/20/2013    Mariah Milling, OTR/L 10/29/2017, 3:50  PM  [facility] [address] [place], [place], [address] Phone: [phone]   Fax:  [phone]  Name: Kelly [name] MRN: [id number] Date of Birth: 21-Dec-1927

## 2017-10-29 NOTE — Therapy (Signed)
[facility] [address] [place], [place], [address] Phone: [phone]   Fax:  [phone]  Physical Therapy Treatment  Patient Details  Name: Kelly [name] MRN: [id number] Date of Birth: February 13, 1928 Referring Provider: Dr Leonie Man   Encounter Date: 10/29/2017  PT End of Session - 10/29/17 1539    Visit Number  24    Number of Visits  31    Date for PT Re-Evaluation  11/19/17    Authorization Type  Medicare/[facility]    Authorization Time Period  07-26-17 - 09-24-17; 09-24-17 - 11-24-17; 10-22-17 to 01/20/2018    PT Start Time  1448    PT Stop Time  1528    PT Time Calculation (min)  40 min    Equipment Utilized During Treatment  Gait belt    Activity Tolerance  Patient tolerated treatment well    Behavior During Therapy  [facility] for tasks assessed/performed anxious on steps       Past Medical History:  Diagnosis Date  . Acute blood loss anemia   . Adjustment disorder with depressed mood   . CVA (cerebral vascular accident) ([place]) 02/03/2016   Linear infarct within the posterior limb of left internal capsule  . Diastolic CHF ([place])   . Dysphagia   . Dysphonia 02/20/2013  . Essential and other specified forms of tremor 02/20/2013  . HLD (hyperlipidemia)   . HTN (hypertension)   . OAB (overactive bladder)   . Osteoporosis   . Patient receiving subcutaneous heparin    For DVT prophylaxis 9/17  . Prediabetes   . Psoriasis   . Slow transit constipation   . Stroke (cerebrum) ([place]) 02/01/2016  . Trigeminal neuralgia 02/20/2013    Past Surgical History:  Procedure Laterality Date  . APPENDECTOMY  1940  . bladder tack    . CATARACT EXTRACTION Bilateral   . HEMORRHOID SURGERY  1960  . HUMERUS FRACTURE SURGERY    . LAPAROSCOPIC HYSTERECTOMY  2006  . torn rotator cuff    . WRIST FRACTURE SURGERY  2005   seconday shoulder 2001    There were no vitals filed for this visit.  Subjective Assessment - 10/29/17 1455    Subjective   Went to the park and walked this weekend.  Husband reports she walked 800 ft!   Patient is accompained by:  Family member    Pertinent History  Rt MCA CVA on 02-03-16:  Cryptogenic CVA on 04-05-17; pt received OP PT at this facility from 06-01-16 - 11-19-16    Patient Stated Goals  "I would like to walk in the park"                        [facility] Adult PT Treatment/Exercise - 10/29/17 0001      Lumbar Exercises: Supine   Bridge  10 reps  (brace removed and PT held Rt foot from slipping)    heelslides RLE with resisted knee flexion and knee extension x 10 step up backwards onto 3" box, using RLE to step up and lower her x 7 (holding RW with bil hands; became fatigued and fearful and required seated rest afterwards Stand on RLE without Rt genu recurvatum while lt foot taps up onto 2" block and back to floor x 10 with ability to control knee 5/10 reps         PT Short Term Goals - 08/24/17 1457      PT SHORT TERM GOAL #1  Title  Pt will perform sit to stand 5 reps from mat without UE support to demo improved LE strength.    Baseline  Pt able to perform without UE support but stabilizes legs against mat table upon initial standing -08-24-17    Status  Partially Met      PT SHORT TERM GOAL #2   Title  Pt will receive consult for custom AFO for RLE.    Baseline  scheduled at [facility] on 07-27-17; not yet received -- 08-24-17    Status  On-going      PT SHORT TERM GOAL #3   Title  Pt will stand for at least 2" without UE support with SBA to increase independence with ADL's.    Baseline  met 08-24-17    Status  Achieved      PT SHORT TERM GOAL #4   Title  Incr. gait velocity to >/= .9 ft/sec with RW for incr. gait efficiency.    Baseline  .58 ft/sec with RW (no AFO used); 1.50 ft/sec with RW - 08-24-17    Status  Achieved      PT SHORT TERM GOAL #5   Title  Pt will amb. with RW 350' with SBA on flat, even surface.    Baseline  met 08-24-17    Status  Achieved        PT Long  Term Goals - 10/23/17 0905      PT LONG TERM GOAL #6   Title  Patient will increase gait velocity to >= 1.81 ft/sec (threshold for lower fall risk) (Target for new LTGs 11/19/17)    Baseline  5/28  1.52 ft/sec    Time  4    Period  Weeks    Status  New      PT LONG TERM GOAL #7   Title  Patient will ambulate 500 ft in <6 minutes over level indoor or outdoor surfaces (dependent on weather) with close supervision to allow walking into church and to classroom.     Baseline  5/31  459f    Time  4    Period  Weeks    Status  New      PT LONG TERM GOAL #8   Title  Patient will be able to maintain >50 steps/min on seated stepper x 15 minutes to demonstrate improved muscular endurance.    Time  4    Period  Weeks    Status  New            Plan - 10/29/17 1540    Clinical Impression Statement  Skilled session included strength, gait, and functional mobility training (including up/down steps). Patient much less fearful of descending 4 steps today, however continues with strong posterior lean as she goes to advance "weak" leg down to next step. Continue strengthening of rt hamstrings. Patient progressing towards LTGs.     Rehab Potential  Good    Clinical Impairments Affecting Rehab Potential  severity of deficits with h/o prior CVA with Rt hemiparesis    PT Frequency  2x / week    PT Duration  4 weeks    PT Treatment/Interventions  ADLs/Self Care Home Management;Stair training;Gait training;DME Instruction;Therapeutic activities;Therapeutic exercise;Balance training;Neuromuscular re-education;Patient/family education;Orthotic Fit/Training;Passive range of motion    PT Next Visit Plan  cont gait training for endurance - inside/outside as pt able to tolerate--goal 500 ft to be able to walk into church; Nustep for incr endurance (goal set for 15 nmin with >50 spm; stair training (  has been more difficult per husband); curb training (try on actual curb outside);, RLE strengthening and balance  training    PT Home Exercise Plan  see above    Consulted and Agree with Plan of Care  Patient;Family member/caregiver    Family Member Consulted  husband       Patient will benefit from skilled therapeutic intervention in order to improve the following deficits and impairments:  Abnormal gait, Decreased endurance, Decreased activity tolerance, Decreased balance, Decreased coordination, Decreased range of motion, Decreased strength, Impaired tone, Impaired UE functional use, Postural dysfunction  Visit Diagnosis: Muscle weakness (generalized)  Unsteadiness on feet  Other symptoms and signs involving the nervous system  Other abnormalities of gait and mobility     Problem List Patient Active Problem List   Diagnosis Date Noted  . Right spastic hemiplegia ([place]) 10/13/2017  . Acute lower UTI   . Labile blood pressure   . Abnormal urinalysis   . Trauma   . Closed compression fracture of L4 lumbar vertebra   . Neurogenic bladder   . History of hypertension   . Anemia of chronic disease   . Hypoalbuminemia due to protein-calorie malnutrition ([place])   . Acute bilateral cerebral infarction in a watershed distribution [facility]) 04/05/2017  . Benign essential HTN   . Chronic diastolic heart failure ([place])   . Right spastic hemiparesis ([place])   . History of stroke 02/16/2017  . Abnormality of gait following cerebrovascular accident (CVA) 02/16/2017  . Slow transit constipation   . Acute blood loss anemia   . Adjustment disorder with depressed mood   . Cerebrovascular accident (CVA) ([place]) 02/03/2016  . Dysarthria, post-stroke   . Dysphagia, post-stroke   . Leukocytosis   . Prediabetes   . Right hemiparesis ([place])   . Cerebral infarction due to unspecified mechanism   . Other secondary hypertension   . Overactive bladder   . Diastolic dysfunction   . Hyperlipidemia   . Essential hypertension   . Essential tremor   . Stroke (cerebrum) ([place]) 02/01/2016  . Facial droop due to stroke  02/01/2016  . Essential and other specified forms of tremor 02/20/2013  . Dysphonia 02/20/2013  . Trigeminal neuralgia 02/20/2013    Rexanne Mano, PT 10/29/2017, 3:44 PM  [facility] [address] [place], [place], [address] Phone: [phone]   Fax:  [phone]  Name: RHETA HEMMELGARN MRN: [id number] Date of Birth: 1927-09-01

## 2017-11-02 ENCOUNTER — Ambulatory Visit: Payer: Medicare Other | Admitting: Occupational Therapy

## 2017-11-02 ENCOUNTER — Ambulatory Visit: Payer: Medicare Other | Admitting: Physical Therapy

## 2017-11-02 DIAGNOSIS — R2689 Other abnormalities of gait and mobility: Secondary | ICD-10-CM

## 2017-11-02 DIAGNOSIS — R29818 Other symptoms and signs involving the nervous system: Secondary | ICD-10-CM

## 2017-11-02 DIAGNOSIS — R293 Abnormal posture: Secondary | ICD-10-CM | POA: Diagnosis not present

## 2017-11-02 DIAGNOSIS — M25641 Stiffness of right hand, not elsewhere classified: Secondary | ICD-10-CM | POA: Diagnosis not present

## 2017-11-02 DIAGNOSIS — M6281 Muscle weakness (generalized): Secondary | ICD-10-CM

## 2017-11-02 DIAGNOSIS — I69351 Hemiplegia and hemiparesis following cerebral infarction affecting right dominant side: Secondary | ICD-10-CM | POA: Diagnosis not present

## 2017-11-02 DIAGNOSIS — R2681 Unsteadiness on feet: Secondary | ICD-10-CM | POA: Diagnosis not present

## 2017-11-02 DIAGNOSIS — R278 Other lack of coordination: Secondary | ICD-10-CM

## 2017-11-02 DIAGNOSIS — M25611 Stiffness of right shoulder, not elsewhere classified: Secondary | ICD-10-CM

## 2017-11-02 NOTE — Therapy (Signed)
[facility] [address] [place], [place], [address] Phone: [phone]   Fax:  [phone]  Occupational Therapy Treatment  Patient Details  Name: Kelly [name] MRN: [id number] Date of Birth: 09/03/1927 Referring Provider: Dr Pearlean BrownieSethi   Encounter Date: 11/02/2017  OT End of Session - 11/02/17 1602    Visit Number  23    Number of Visits  29    Date for OT Re-Evaluation  11/21/17    Authorization Type  medicare    Authorization Time Period  60 days- renewed for 2x week for 4 weeks on 09/23/17, next week will be 1/4 weeks    OT Start Time  1401    OT Stop Time  1445    OT Time Calculation (min)  44 min    Activity Tolerance  Patient tolerated treatment well    Behavior During Therapy  [facility] for tasks assessed/performed       Past Medical History:  Diagnosis Date  . Acute blood loss anemia   . Adjustment disorder with depressed mood   . CVA (cerebral vascular accident) (HCC) 02/03/2016   Linear infarct within the posterior limb of left internal capsule  . Diastolic CHF (HCC)   . Dysphagia   . Dysphonia 02/20/2013  . Essential and other specified forms of tremor 02/20/2013  . HLD (hyperlipidemia)   . HTN (hypertension)   . OAB (overactive bladder)   . Osteoporosis   . Patient receiving subcutaneous heparin    For DVT prophylaxis 9/17  . Prediabetes   . Psoriasis   . Slow transit constipation   . Stroke (cerebrum) (HCC) 02/01/2016  . Trigeminal neuralgia 02/20/2013    Past Surgical History:  Procedure Laterality Date  . APPENDECTOMY  1940  . bladder tack    . CATARACT EXTRACTION Bilateral   . HEMORRHOID SURGERY  1960  . HUMERUS FRACTURE SURGERY    . LAPAROSCOPIC HYSTERECTOMY  2006  . torn rotator cuff    . WRIST FRACTURE SURGERY  2005   seconday shoulder 2001    There were no vitals filed for this visit.  Subjective Assessment - 11/02/17 1411    Subjective   I ate cauliflower, orange slices, corn bread  with my right hand.    Patient is accompained by:  Family member    Pertinent History  L MCA 01/2016, Multiple bilateral CVA's 03/2017, HTN, CHF    Currently in Pain?  No/denies    Pain Score  0-No pain                   OT Treatments/Exercises (OP) - 11/02/17 0001      Neurological Re-education Exercises   Other Exercises 1  Working to increase PIP flexion and extension in right hand.  Patient with increased muscle tone in right thumb toward adduction, and needs overt verbal and physical cueing to reduce tension.  Patient does have active thumb movement, but significant loss of sensation in right hand for control.  Patient also with learned non use of right hand.  Working to establish home activity program for functional uses of right hand in her environment to help preserve motion gained by Botox.      Other Exercises 2  Patient able to complete box and blocks in standing - with use of digits (not thumb and index)  to obtain and release 1 inch blocks (11: 1 min).  Patient able to remove large pegs from pegboard and drop nto container - will  utilize as able as home exercise program - husband aware.        Splinting   Splinting  Patient brought resting hand splint - able to don and doff without assistance.  Splint still fits well, and she was encouraged to wear 2 hours at a time twice during the day.               OT Education - 11/02/17 1601    Education provided  Yes    Education Details  splint schedule - wear for two hours, twice daily.  Do not wear at night.      Person(s) Educated  Patient;Spouse    Methods  Explanation;Demonstration    Comprehension  Verbalized understanding       OT Short Term Goals - 11/02/17 1415      OT SHORT TERM GOAL #6   Title  Patient will bring finger food to mouth once placed in right hand    Status  Achieved        OT Long Term Goals - 11/02/17 1419      OT LONG TERM GOAL #6   Title  Patient will wash lightweight dishes after  meals using BUE's  (Pended)     Status  Deferred  (Pended)       OT LONG TERM GOAL #8   Title  Pt will feed herself finger foods at least 50% of the time with RUE.- check 10/22/17  (Pended)             Plan - 11/02/17 1602    Clinical Impression Statement  Patient with more active extension in right digits - so better ability to grasp and release lightweight objects.  Need to continue to encourage  functional use    Occupational Profile and client history currently impacting functional performance  Wife, mother, fiercely independent prior to CVA's, active in church    Occupational performance deficits (Please refer to evaluation for details):  ADL's;IADL's;Social Participation;Leisure    Rehab Potential  Good    OT Frequency  2x / week    OT Duration  4 weeks    OT Treatment/Interventions  Moist Heat;Fluidtherapy;DME and/or AE instruction;Splinting;Balance training;Therapeutic activities;Therapeutic exercise;Cognitive remediation/compensation;Neuromuscular education;Functional Mobility Training;Patient/family education;Manual Therapy;Paraffin;Electrical Stimulation;Self-care/ADL training    Plan  functional use of right UE - Home activity program    Clinical Decision Making  Several treatment options, min-mod task modification necessary    Consulted and Agree with Plan of Care  Patient;Family member/caregiver    Family Member Consulted  husband       Patient will benefit from skilled therapeutic intervention in order to improve the following deficits and impairments:  Decreased coordination, Decreased range of motion, Difficulty walking, Impaired flexibility, Improper body mechanics, Decreased safety awareness, Increased edema, Impaired sensation, Decreased activity tolerance, Impaired tone, Decreased balance, Decreased knowledge of use of DME, Impaired UE functional use, Decreased cognition, Decreased mobility, Decreased strength, Impaired perceived functional ability  Visit  Diagnosis: Spastic hemiplegia of right dominant side as late effect of cerebral infarction (HCC)  Stiffness of right hand, not elsewhere classified  Other symptoms and signs involving the nervous system  Muscle weakness (generalized)  Unsteadiness on feet  Abnormal posture  Stiffness of right shoulder, not elsewhere classified  Other lack of coordination    Problem List Patient Active Problem List   Diagnosis Date Noted  . Right spastic hemiplegia (HCC) 10/13/2017  . Acute lower UTI   . Labile blood pressure   . Abnormal urinalysis   .  Trauma   . Closed compression fracture of L4 lumbar vertebra   . Neurogenic bladder   . History of hypertension   . Anemia of chronic disease   . Hypoalbuminemia due to protein-calorie malnutrition (HCC)   . Acute bilateral cerebral infarction in a watershed distribution [facility]) 04/05/2017  . Benign essential HTN   . Chronic diastolic heart failure (HCC)   . Right spastic hemiparesis (HCC)   . History of stroke 02/16/2017  . Abnormality of gait following cerebrovascular accident (CVA) 02/16/2017  . Slow transit constipation   . Acute blood loss anemia   . Adjustment disorder with depressed mood   . Cerebrovascular accident (CVA) (HCC) 02/03/2016  . Dysarthria, post-stroke   . Dysphagia, post-stroke   . Leukocytosis   . Prediabetes   . Right hemiparesis (HCC)   . Cerebral infarction due to unspecified mechanism   . Other secondary hypertension   . Overactive bladder   . Diastolic dysfunction   . Hyperlipidemia   . Essential hypertension   . Essential tremor   . Stroke (cerebrum) (HCC) 02/01/2016  . Facial droop due to stroke 02/01/2016  . Essential and other specified forms of tremor 02/20/2013  . Dysphonia 02/20/2013  . Trigeminal neuralgia 02/20/2013    Collier Salina, OTR/L 11/02/2017, 4:04 PM  [facility] [address] [place], [place], [address] Phone:  [phone]   Fax:  [phone]  Name: Kelly [name] MRN: [id number] Date of Birth: Sep 21, 1927

## 2017-11-03 ENCOUNTER — Encounter: Payer: Self-pay | Admitting: Physical Therapy

## 2017-11-03 NOTE — Therapy (Signed)
[facility] [address] [place], [place], [address] Phone: [phone]   Fax:  [phone]  Physical Therapy Treatment  Patient Details  Name: Kelly [name] MRN: [id number] Date of Birth: 1928/02/13 Referring Provider: Dr Leonie Man   Encounter Date: 11/02/2017  PT End of Session - 11/03/17 1435    Visit Number  25    Number of Visits  31    Date for PT Re-Evaluation  11/19/17    Authorization Type  Medicare/[facility]    Authorization Time Period  07-26-17 - 09-24-17; 09-24-17 - 11-24-17; 10-22-17 to 01/20/2018    PT Start Time  1449    PT Stop Time  1533    PT Time Calculation (min)  44 min       Past Medical History:  Diagnosis Date  . Acute blood loss anemia   . Adjustment disorder with depressed mood   . CVA (cerebral vascular accident) ([place]) 02/03/2016   Linear infarct within the posterior limb of left internal capsule  . Diastolic CHF ([place])   . Dysphagia   . Dysphonia 02/20/2013  . Essential and other specified forms of tremor 02/20/2013  . HLD (hyperlipidemia)   . HTN (hypertension)   . OAB (overactive bladder)   . Osteoporosis   . Patient receiving subcutaneous heparin    For DVT prophylaxis 9/17  . Prediabetes   . Psoriasis   . Slow transit constipation   . Stroke (cerebrum) ([place]) 02/01/2016  . Trigeminal neuralgia 02/20/2013    Past Surgical History:  Procedure Laterality Date  . APPENDECTOMY  1940  . bladder tack    . CATARACT EXTRACTION Bilateral   . HEMORRHOID SURGERY  1960  . HUMERUS FRACTURE SURGERY    . LAPAROSCOPIC HYSTERECTOMY  2006  . torn rotator cuff    . WRIST FRACTURE SURGERY  2005   seconday shoulder 2001    There were no vitals filed for this visit.  Subjective Assessment - 11/03/17 1430    Subjective  Pt reports she walked 800' in park with RW - states "I was pleased with that"    Pertinent History  Rt MCA CVA on 02-03-16:  Cryptogenic CVA on 04-05-17; pt received OP PT at this  facility from 06-01-16 - 11-19-16    Patient Stated Goals  "I would like to walk in the park"     Currently in Pain?  No/denies                       [facility] Adult PT Treatment/Exercise - 11/03/17 0001      Transfers   Transfers  Sit to Stand;Stand to Sit    Sit to Stand  4: Min guard    Sit to Stand Details (indicate cue type and reason)  cues for anterior weight shift to prevent LOB posteriorly    Stand to Sit  4: Min guard    Number of Reps  Other reps (comment) 5    Comments  attempted to perform with as minimal support necessaary       Ambulation/Gait   Ambulation/Gait  Yes    Ambulation/Gait Assistance  5: Supervision    Ambulation/Gait Assistance Details  cues for upright posture    Ambulation Distance (Feet)  200 Feet    Assistive device  Rolling walker    Gait Pattern  Decreased hip/knee flexion - right;Decreased dorsiflexion - right;Decreased weight shift to right;Decreased step length - right;Right genu recurvatum;Trunk flexed  Ambulation Surface  Level;Indoor          Balance Exercises - 11/03/17 1433      Balance Exercises: Standing   Rockerboard  Anterior/posterior;Other reps (comment);UE support 20 reps- mod assist with posterior weight shift    Step Ups  4 inch;Forward;UE support 2 10 reps    Retro Gait  3 reps insdie // bars with CGA    Other Standing Exercises  Pt performed standing forward kicks, backward and side kicks x 10 reps each leg; marching in place x 10 reps; sidestepping 2 reps inside // bars with bil. UE support      Pt performed stepping over/back of black balance beam 10 reps each leg  Alternate tap ups with each foot to 4" step x 10 reps each with LUE support with CGA     PT Short Term Goals - 08/24/17 1457      PT SHORT TERM GOAL #1   Title  Pt will perform sit to stand 5 reps from mat without UE support to demo improved LE strength.    Baseline  Pt able to perform without UE support but stabilizes legs against mat table  upon initial standing -08-24-17    Status  Partially Met      PT SHORT TERM GOAL #2   Title  Pt will receive consult for custom AFO for RLE.    Baseline  scheduled at [facility] on 07-27-17; not yet received -- 08-24-17    Status  On-going      PT SHORT TERM GOAL #3   Title  Pt will stand for at least 2" without UE support with SBA to increase independence with ADL's.    Baseline  met 08-24-17    Status  Achieved      PT SHORT TERM GOAL #4   Title  Incr. gait velocity to >/= .9 ft/sec with RW for incr. gait efficiency.    Baseline  .47 ft/sec with RW (no AFO used); 1.50 ft/sec with RW - 08-24-17    Status  Achieved      PT SHORT TERM GOAL #5   Title  Pt will amb. with RW 350' with SBA on flat, even surface.    Baseline  met 08-24-17    Status  Achieved        PT Long Term Goals - 10/23/17 0905      PT LONG TERM GOAL #6   Title  Patient will increase gait velocity to >= 1.81 ft/sec (threshold for lower fall risk) (Target for new LTGs 11/19/17)    Baseline  5/28  1.52 ft/sec    Time  4    Period  Weeks    Status  New      PT LONG TERM GOAL #7   Title  Patient will ambulate 500 ft in <6 minutes over level indoor or outdoor surfaces (dependent on weather) with close supervision to allow walking into church and to classroom.     Baseline  5/31  465f    Time  4    Period  Weeks    Status  New      PT LONG TERM GOAL #8   Title  Patient will be able to maintain >50 steps/min on seated stepper x 15 minutes to demonstrate improved muscular endurance.    Time  4    Period  Weeks    Status  New            Plan - 11/03/17 1436  Clinical Impression Statement  Pt demonstrating good activity tolerance with few rest breaks needed with standing strengthening and balance activities;  pt did report that RLE was tired after performing forward step ups and step down exercise.  Pt is progressing towards goals.      Rehab Potential  Good    Clinical Impairments Affecting Rehab Potential   severity of deficits with h/o prior CVA with Rt hemiparesis    PT Frequency  2x / week    PT Duration  4 weeks    PT Treatment/Interventions  ADLs/Self Care Home Management;Stair training;Gait training;DME Instruction;Therapeutic activities;Therapeutic exercise;Balance training;Neuromuscular re-education;Patient/family education;Orthotic Fit/Training;Passive range of motion    PT Next Visit Plan  cont gait training for endurance - inside/outside as pt able to tolerate--goal 500 ft to be able to walk into church; Nustep for incr endurance (goal set for 15 nmin with >50 spm; stair training (has been more difficult per husband); curb training (try on actual curb outside);, RLE strengthening and balance training    Consulted and Agree with Plan of Care  Patient;Family member/caregiver    Family Member Consulted  husband       Patient will benefit from skilled therapeutic intervention in order to improve the following deficits and impairments:  Abnormal gait, Decreased endurance, Decreased activity tolerance, Decreased balance, Decreased coordination, Decreased range of motion, Decreased strength, Impaired tone, Impaired UE functional use, Postural dysfunction  Visit Diagnosis: Other abnormalities of gait and mobility  Unsteadiness on feet  Muscle weakness (generalized)     Problem List Patient Active Problem List   Diagnosis Date Noted  . Right spastic hemiplegia ([place]) 10/13/2017  . Acute lower UTI   . Labile blood pressure   . Abnormal urinalysis   . Trauma   . Closed compression fracture of L4 lumbar vertebra   . Neurogenic bladder   . History of hypertension   . Anemia of chronic disease   . Hypoalbuminemia due to protein-calorie malnutrition ([place])   . Acute bilateral cerebral infarction in a watershed distribution [facility]) 04/05/2017  . Benign essential HTN   . Chronic diastolic heart failure ([place])   . Right spastic hemiparesis ([place])   . History of stroke 02/16/2017  .  Abnormality of gait following cerebrovascular accident (CVA) 02/16/2017  . Slow transit constipation   . Acute blood loss anemia   . Adjustment disorder with depressed mood   . Cerebrovascular accident (CVA) ([place]) 02/03/2016  . Dysarthria, post-stroke   . Dysphagia, post-stroke   . Leukocytosis   . Prediabetes   . Right hemiparesis ([place])   . Cerebral infarction due to unspecified mechanism   . Other secondary hypertension   . Overactive bladder   . Diastolic dysfunction   . Hyperlipidemia   . Essential hypertension   . Essential tremor   . Stroke (cerebrum) ([place]) 02/01/2016  . Facial droop due to stroke 02/01/2016  . Essential and other specified forms of tremor 02/20/2013  . Dysphonia 02/20/2013  . Trigeminal neuralgia 02/20/2013    Alda Lea, PT 11/03/2017, 2:41 PM  [facility] [address] [place], [place], [address] Phone: [phone]   Fax:  [phone]  Name: KAILYNNE FERRINGTON MRN: [id number] Date of Birth: 12-02-1927

## 2017-11-04 ENCOUNTER — Ambulatory Visit: Payer: Medicare Other | Admitting: Physical Therapy

## 2017-11-04 ENCOUNTER — Encounter: Payer: Self-pay | Admitting: Occupational Therapy

## 2017-11-04 ENCOUNTER — Encounter: Payer: Self-pay | Admitting: Physical Therapy

## 2017-11-04 ENCOUNTER — Ambulatory Visit: Payer: Medicare Other | Admitting: Occupational Therapy

## 2017-11-04 DIAGNOSIS — M25611 Stiffness of right shoulder, not elsewhere classified: Secondary | ICD-10-CM | POA: Diagnosis not present

## 2017-11-04 DIAGNOSIS — I69351 Hemiplegia and hemiparesis following cerebral infarction affecting right dominant side: Secondary | ICD-10-CM

## 2017-11-04 DIAGNOSIS — R2681 Unsteadiness on feet: Secondary | ICD-10-CM | POA: Diagnosis not present

## 2017-11-04 DIAGNOSIS — M25641 Stiffness of right hand, not elsewhere classified: Secondary | ICD-10-CM | POA: Diagnosis not present

## 2017-11-04 DIAGNOSIS — R2689 Other abnormalities of gait and mobility: Secondary | ICD-10-CM

## 2017-11-04 DIAGNOSIS — R293 Abnormal posture: Secondary | ICD-10-CM

## 2017-11-04 DIAGNOSIS — I69315 Cognitive social or emotional deficit following cerebral infarction: Secondary | ICD-10-CM

## 2017-11-04 DIAGNOSIS — M6281 Muscle weakness (generalized): Secondary | ICD-10-CM | POA: Diagnosis not present

## 2017-11-04 DIAGNOSIS — R29818 Other symptoms and signs involving the nervous system: Secondary | ICD-10-CM

## 2017-11-04 DIAGNOSIS — R278 Other lack of coordination: Secondary | ICD-10-CM

## 2017-11-04 NOTE — Therapy (Signed)
[facility] [address] [place], [place], [address] Phone: [phone]   Fax:  [phone]  Occupational Therapy Treatment  Patient Details  Name: Kelly [name] MRN: [id number] Date of Birth: [birth year] Referring Provider: Dr Leonie Man   Encounter Date: 11/04/2017  OT End of Session - 11/04/17 1732    Visit Number  24    Number of Visits  29    Date for OT Re-Evaluation  11/21/17    Authorization Type  medicare    Authorization Time Period  60 days- renewed for 2x week for 4 weeks on 09/23/17, next week will be 1/4 weeks    OT Start Time  1536    OT Stop Time  1615    OT Time Calculation (min)  39 min    Activity Tolerance  Patient tolerated treatment well    Behavior During Therapy  [facility] for tasks assessed/performed       Past Medical History:  Diagnosis Date  . Acute blood loss anemia   . Adjustment disorder with depressed mood   . CVA (cerebral vascular accident) ([place]) 02/03/2016   Linear infarct within the posterior limb of left internal capsule  . Diastolic CHF ([place])   . Dysphagia   . Dysphonia 02/20/2013  . Essential and other specified forms of tremor 02/20/2013  . HLD (hyperlipidemia)   . HTN (hypertension)   . OAB (overactive bladder)   . Osteoporosis   . Patient receiving subcutaneous heparin    For DVT prophylaxis 9/17  . Prediabetes   . Psoriasis   . Slow transit constipation   . Stroke (cerebrum) ([place]) 02/01/2016  . Trigeminal neuralgia 02/20/2013    Past Surgical History:  Procedure Laterality Date  . APPENDECTOMY  1940  . bladder tack    . CATARACT EXTRACTION Bilateral   . HEMORRHOID SURGERY  1960  . HUMERUS FRACTURE SURGERY    . LAPAROSCOPIC HYSTERECTOMY  2006  . torn rotator cuff    . WRIST FRACTURE SURGERY  2005   seconday shoulder 2001    There were no vitals filed for this visit.  Subjective Assessment - 11/04/17 1728    Subjective   I am so glad we got the walker figured out     Patient is accompained by:  Family member    Pertinent History  L MCA 01/2016, Multiple bilateral CVA's 03/2017, HTN, CHF    Currently in Pain?  No/denies    Pain Score  0-No pain                   OT Treatments/Exercises (OP) - 11/04/17 0001      ADLs   Functional Mobility  Patient able to stand at counter and side step toward right or left to wipe down counter top with right hand.  Patient indicated she could do this in her kitchen at home.  Highlighted how wiping the counter allowed her to keep the mobility in her right arm.  Patient unable to palce a plastic cup in top shelf of dishwasher.  She stated she was able to load the silverwear into the dishwasher at home.  Patient became tearful stating there are just some things that I cannot do.  Discussed discontinuing goals relating to loading / unloading dishawasher.  Reviewed remaining goals and discussed discharge from OT after next two visits.  Patient and husband in agreement.            Balance Exercises - 11/03/17 1433  Balance Exercises: Standing   Rockerboard  Anterior/posterior;Other reps (comment);UE support 20 reps- mod assist with posterior weight shift    Step Ups  4 inch;Forward;UE support 2 10 reps    Retro Gait  3 reps insdie // bars with CGA    Other Standing Exercises  Pt performed standing forward kicks, backward and side kicks x 10 reps each leg; marching in place x 10 reps; sidestepping 2 reps inside // bars with bil. UE support           OT Short Term Goals - 11/04/17 1733      OT SHORT TERM GOAL #1   Title  Patient will complete a home exercise program designed to improve range of motion in right arm- with min prompting.  Due 08/25/17    Status  Achieved      OT SHORT TERM GOAL #2   Title  Patient will complete a home activity program designed to increase functional use of right UE with min prompting    Status  Achieved      OT SHORT TERM GOAL #3   Title  Patient will stand at counter  and remove 2 lightweight items (silverware, lightweight saucer)  from lower rack of dishwasher with close supervision    Status  Achieved      OT SHORT TERM GOAL #4   Title  Patient will reach forward to to obtain a lightweight item (shirt) from lower surface while seated demonstrating 45 degrees of shoulder flexion with sufficient hand opening and grasp.      Status  Achieved      OT SHORT TERM GOAL #5   Title  Pt will be able to cut food using bilateral upper extremities and min assist    Status  Not Met      OT SHORT TERM GOAL #6   Title  Patient will bring finger food to mouth once placed in right hand    Status  Achieved        OT Long Term Goals - 11/04/17 1733      OT LONG TERM GOAL #1   Title  Patient will complete an HEP designed to improve RUE AROM/PROM with mod I due 5/3    Status  Achieved      OT LONG TERM GOAL #2   Title  Patient will independently complete a home activity program designed to improve right UE functional ability - check 10/22/17    Status  Achieved      OT LONG TERM GOAL #3   Title  Pt will be able to write name legibly with LUE and AE prn    Status  Achieved      OT LONG TERM GOAL #4   Title  Patient will load 2-3 lightweight items into dishwasher after each meal- using right upper extremity- check 10/22/17    Status  Achieved      OT LONG TERM GOAL #5   Title  Patient will use RUE to wipe down countertops in kitchen after meals to assist with clean up     Status  On-going      [place] #6   Title  Patient will wash lightweight dishes after meals using BUE's    Status  Deferred      OT LONG TERM GOAL #7   Title  Patient will demonstrate ability to use right arm to open cupboard doors and drawers in her home check 10/22/17    Status  On-going      OT LONG TERM GOAL #8   Title  Pt will feed herself finger foods at least 50% of the time with RUE.- check 10/22/17    Status  On-going      OT LONG TERM GOAL  #9   Baseline  Pt will be  able to manipulate 1 inch object in R hand with at least 75% accuracy.    Status  Achieved            Plan - 11/04/17 1732    Clinical Impression Statement  Patient with more active extension in right digits - so better ability to grasp and release lightweight objects.      Occupational Profile and client history currently impacting functional performance  Wife, mother, fiercely independent prior to CVA's, active in church    Occupational performance deficits (Please refer to evaluation for details):  ADL's;IADL's;Social Participation;Leisure    Rehab Potential  Good    OT Frequency  2x / week    OT Duration  4 weeks    OT Treatment/Interventions  Moist Heat;Fluidtherapy;DME and/or AE instruction;Splinting;Balance training;Therapeutic activities;Therapeutic exercise;Cognitive remediation/compensation;Neuromuscular education;Functional Mobility Training;Patient/family education;Manual Therapy;Paraffin;Electrical Stimulation;Self-care/ADL training    Plan  balanced flex/ext in digits - specifically pip index    Clinical Decision Making  Several treatment options, min-mod task modification necessary    Consulted and Agree with Plan of Care  Patient;Family member/caregiver    Family Member Consulted  husband       Patient will benefit from skilled therapeutic intervention in order to improve the following deficits and impairments:  Decreased coordination, Decreased range of motion, Difficulty walking, Impaired flexibility, Improper body mechanics, Decreased safety awareness, Increased edema, Impaired sensation, Decreased activity tolerance, Impaired tone, Decreased balance, Decreased knowledge of use of DME, Impaired UE functional use, Decreased cognition, Decreased mobility, Decreased strength, Impaired perceived functional ability  Visit Diagnosis: Unsteadiness on feet  Spastic hemiplegia of right dominant side as late effect of cerebral infarction (HCC)  Stiffness of right hand, not  elsewhere classified  Muscle weakness (generalized)  Other symptoms and signs involving the nervous system  Abnormal posture  Stiffness of right shoulder, not elsewhere classified  Other lack of coordination  Cognitive social or emotional deficit following cerebral infarction    Problem List Patient Active Problem List   Diagnosis Date Noted  . Right spastic hemiplegia ([place]) 10/13/2017  . Acute lower UTI   . Labile blood pressure   . Abnormal urinalysis   . Trauma   . Closed compression fracture of L4 lumbar vertebra   . Neurogenic bladder   . History of hypertension   . Anemia of chronic disease   . Hypoalbuminemia due to protein-calorie malnutrition ([place])   . Acute bilateral cerebral infarction in a watershed distribution [facility]) 04/05/2017  . Benign essential HTN   . Chronic diastolic heart failure ([place])   . Right spastic hemiparesis ([place])   . History of stroke 02/16/2017  . Abnormality of gait following cerebrovascular accident (CVA) 02/16/2017  . Slow transit constipation   . Acute blood loss anemia   . Adjustment disorder with depressed mood   . Cerebrovascular accident (CVA) ([place]) 02/03/2016  . Dysarthria, post-stroke   . Dysphagia, post-stroke   . Leukocytosis   . Prediabetes   . Right hemiparesis ([place])   . Cerebral infarction due to unspecified mechanism   . Other secondary hypertension   . Overactive bladder   . Diastolic dysfunction   . Hyperlipidemia   . Essential hypertension   .  Essential tremor   . Stroke (cerebrum) ([place]) 02/01/2016  . Facial droop due to stroke 02/01/2016  . Essential and other specified forms of tremor 02/20/2013  . Dysphonia 02/20/2013  . Trigeminal neuralgia 02/20/2013    Mariah Milling, OTR/L 11/04/2017, 5:35 PM  [facility] [address] [place], [place], [address] Phone: [phone]   Fax:  [phone]  Name: Kelly [name] MRN: [id number] Date of  Birth: 25-Jun-1927

## 2017-11-05 NOTE — Therapy (Signed)
[facility] [address] [place], [place], [address] Phone: [phone]   Fax:  [phone]  Physical Therapy Treatment  Patient Details  Name: Kelly [name] MRN: [id number] Date of Birth: 02-17-1928 Referring Provider: Dr Leonie Man   Encounter Date: 11/04/2017  PT End of Session - 11/04/17 1505    Visit Number  26    Number of Visits  31    Date for PT Re-Evaluation  11/19/17    Authorization Type  Medicare/[facility]    Authorization Time Period  07-26-17 - 09-24-17; 09-24-17 - 11-24-17; 10-22-17 to 01/20/2018    PT Start Time  1446    PT Stop Time  1525    PT Time Calculation (min)  39 min    Activity Tolerance  Patient tolerated treatment well    Behavior During Therapy  [facility] for tasks assessed/performed       Past Medical History:  Diagnosis Date  . Acute blood loss anemia   . Adjustment disorder with depressed mood   . CVA (cerebral vascular accident) ([place]) 02/03/2016   Linear infarct within the posterior limb of left internal capsule  . Diastolic CHF ([place])   . Dysphagia   . Dysphonia 02/20/2013  . Essential and other specified forms of tremor 02/20/2013  . HLD (hyperlipidemia)   . HTN (hypertension)   . OAB (overactive bladder)   . Osteoporosis   . Patient receiving subcutaneous heparin    For DVT prophylaxis 9/17  . Prediabetes   . Psoriasis   . Slow transit constipation   . Stroke (cerebrum) ([place]) 02/01/2016  . Trigeminal neuralgia 02/20/2013    Past Surgical History:  Procedure Laterality Date  . APPENDECTOMY  1940  . bladder tack    . CATARACT EXTRACTION Bilateral   . HEMORRHOID SURGERY  1960  . HUMERUS FRACTURE SURGERY    . LAPAROSCOPIC HYSTERECTOMY  2006  . torn rotator cuff    . WRIST FRACTURE SURGERY  2005   seconday shoulder 2001    There were no vitals filed for this visit.  Subjective Assessment - 11/04/17 1451    Subjective  Didn't go to church this Sunday to practice walking in. Haven't  been back to the park since the weekend. But so thrilled she was able to walk in the park!    Pertinent History  Rt MCA CVA on 02-03-16:  Cryptogenic CVA on 04-05-17; pt received OP PT at this facility from 06-01-16 - 11-19-16    Patient Stated Goals  "I would like to walk in the park"     Currently in Pain?  No/denies                       [facility] Adult PT Treatment/Exercise - 11/04/17 1503      Knee/Hip Exercises: Aerobic   Other Aerobic  Sci-Fit stepper @ level 2.5 x 11  minutes 45 seconds (step rate 40-50)     Ambulation with minguard to min assist (assist to protract rt hip/hemipelvis to reduce genu recurvatum) with RW x 80 ft, 40 ft x 3; in // bars stepping over black foam beam fwd/backward x 10 each leg; on rockerboard, balance with EO light UE support, then minisquats x 10.   Educated pt/husband on benefits of aerobic conditioning (in general and specifically for muscular endurance). Pt has a restorator at home (has difficulty keeping feet on the pedals) and [facility] but [facility] only a couple  of machines she could do. Patient expressed desire to have recumbent cycle (or something similar) at home for more frequent access. Educated on store which sells used (and new) sports equipment.            PT Short Term Goals - 08/24/17 1457      PT SHORT TERM GOAL #1   Title  Pt will perform sit to stand 5 reps from mat without UE support to demo improved LE strength.    Baseline  Pt able to perform without UE support but stabilizes legs against mat table upon initial standing -08-24-17    Status  Partially Met      PT SHORT TERM GOAL #2   Title  Pt will receive consult for custom AFO for RLE.    Baseline  scheduled at [facility] on 07-27-17; not yet received -- 08-24-17    Status  On-going      PT SHORT TERM GOAL #3   Title  Pt will stand for at least 2" without UE support with SBA to increase independence with ADL's.    Baseline  met 08-24-17    Status  Achieved       PT SHORT TERM GOAL #4   Title  Incr. gait velocity to >/= .9 ft/sec with RW for incr. gait efficiency.    Baseline  .92 ft/sec with RW (no AFO used); 1.50 ft/sec with RW - 08-24-17    Status  Achieved      PT SHORT TERM GOAL #5   Title  Pt will amb. with RW 350' with SBA on flat, even surface.    Baseline  met 08-24-17    Status  Achieved        PT Long Term Goals - 10/23/17 0905      PT LONG TERM GOAL #6   Title  Patient will increase gait velocity to >= 1.81 ft/sec (threshold for lower fall risk) (Target for new LTGs 11/19/17)    Baseline  5/28  1.52 ft/sec    Time  4    Period  Weeks    Status  New      PT LONG TERM GOAL #7   Title  Patient will ambulate 500 ft in <6 minutes over level indoor or outdoor surfaces (dependent on weather) with close supervision to allow walking into church and to classroom.     Baseline  5/31  430f    Time  4    Period  Weeks    Status  New      PT LONG TERM GOAL #8   Title  Patient will be able to maintain >50 steps/min on seated stepper x 15 minutes to demonstrate improved muscular endurance.    Time  4    Period  Weeks    Status  New            Plan - 11/04/17 1507    Clinical Impression Statement  Working on muscular endurance with goal of increasing ambulation distance/tolerance for walking in/out of church. Patient with only one seated rest break througout session. Patient making slow steady gains with gait. Continue to work towards her LTGs.    Rehab Potential  Good    Clinical Impairments Affecting Rehab Potential  severity of deficits with h/o prior CVA with Rt hemiparesis    PT Frequency  2x / week    PT Duration  4 weeks    PT Treatment/Interventions  ADLs/Self Care Home Management;Stair training;Gait training;DME Instruction;Therapeutic activities;Therapeutic exercise;Balance  training;Neuromuscular re-education;Patient/family education;Orthotic Fit/Training;Passive range of motion    PT Next Visit Plan  cont gait  training for endurance - inside/outside as pt able to tolerate--goal 500 ft to be able to walk into church; Nustep for incr endurance (goal set for 15 nmin with >50 spm; stair training (has been more difficult per husband); curb training (try on actual curb outside);, RLE strengthening and balance training    Consulted and Agree with Plan of Care  Patient;Family member/caregiver    Family Member Consulted  husband       Patient will benefit from skilled therapeutic intervention in order to improve the following deficits and impairments:  Abnormal gait, Decreased endurance, Decreased activity tolerance, Decreased balance, Decreased coordination, Decreased range of motion, Decreased strength, Impaired tone, Impaired UE functional use, Postural dysfunction  Visit Diagnosis: Other abnormalities of gait and mobility  Muscle weakness (generalized)  Unsteadiness on feet     Problem List Patient Active Problem List   Diagnosis Date Noted  . Right spastic hemiplegia ([place]) 10/13/2017  . Acute lower UTI   . Labile blood pressure   . Abnormal urinalysis   . Trauma   . Closed compression fracture of L4 lumbar vertebra   . Neurogenic bladder   . History of hypertension   . Anemia of chronic disease   . Hypoalbuminemia due to protein-calorie malnutrition ([place])   . Acute bilateral cerebral infarction in a watershed distribution [facility]) 04/05/2017  . Benign essential HTN   . Chronic diastolic heart failure ([place])   . Right spastic hemiparesis ([place])   . History of stroke 02/16/2017  . Abnormality of gait following cerebrovascular accident (CVA) 02/16/2017  . Slow transit constipation   . Acute blood loss anemia   . Adjustment disorder with depressed mood   . Cerebrovascular accident (CVA) ([place]) 02/03/2016  . Dysarthria, post-stroke   . Dysphagia, post-stroke   . Leukocytosis   . Prediabetes   . Right hemiparesis ([place])   . Cerebral infarction due to unspecified mechanism   . Other secondary  hypertension   . Overactive bladder   . Diastolic dysfunction   . Hyperlipidemia   . Essential hypertension   . Essential tremor   . Stroke (cerebrum) ([place]) 02/01/2016  . Facial droop due to stroke 02/01/2016  . Essential and other specified forms of tremor 02/20/2013  . Dysphonia 02/20/2013  . Trigeminal neuralgia 02/20/2013    Rexanne Mano, PT 11/05/2017, 1:24 PM  [facility] [address] [place], [place], [address] Phone: [phone]   Fax:  [phone]  Name: LUCINDIA LEMLEY MRN: [id number] Date of Birth: Jun 23, 1927

## 2017-11-09 ENCOUNTER — Ambulatory Visit: Payer: Medicare Other | Admitting: Physical Therapy

## 2017-11-09 ENCOUNTER — Ambulatory Visit: Payer: Medicare Other | Admitting: Occupational Therapy

## 2017-11-09 ENCOUNTER — Encounter: Payer: Self-pay | Admitting: Occupational Therapy

## 2017-11-09 DIAGNOSIS — R293 Abnormal posture: Secondary | ICD-10-CM

## 2017-11-09 DIAGNOSIS — M25641 Stiffness of right hand, not elsewhere classified: Secondary | ICD-10-CM | POA: Diagnosis not present

## 2017-11-09 DIAGNOSIS — I69315 Cognitive social or emotional deficit following cerebral infarction: Secondary | ICD-10-CM

## 2017-11-09 DIAGNOSIS — M25611 Stiffness of right shoulder, not elsewhere classified: Secondary | ICD-10-CM

## 2017-11-09 DIAGNOSIS — R2681 Unsteadiness on feet: Secondary | ICD-10-CM

## 2017-11-09 DIAGNOSIS — M6281 Muscle weakness (generalized): Secondary | ICD-10-CM

## 2017-11-09 DIAGNOSIS — I69351 Hemiplegia and hemiparesis following cerebral infarction affecting right dominant side: Secondary | ICD-10-CM | POA: Diagnosis not present

## 2017-11-09 DIAGNOSIS — R2689 Other abnormalities of gait and mobility: Secondary | ICD-10-CM

## 2017-11-09 DIAGNOSIS — R29818 Other symptoms and signs involving the nervous system: Secondary | ICD-10-CM

## 2017-11-09 DIAGNOSIS — R278 Other lack of coordination: Secondary | ICD-10-CM

## 2017-11-09 NOTE — Therapy (Signed)
[facility] [address] [place], [place], [address] Phone: [phone]   Fax:  [phone]  Occupational Therapy Treatment  Patient Details  Name: Kelly [name] MRN: [id number] Date of Birth: [birth year] Referring Provider: Dr Leonie Man   Encounter Date: 11/09/2017  OT End of Session - 11/09/17 1534    Visit Number  25    Number of Visits  29    Date for OT Re-Evaluation  11/21/17    Authorization Type  medicare    Authorization Time Period  60 days    OT Start Time  1447    OT Stop Time  1528    OT Time Calculation (min)  41 min       Past Medical History:  Diagnosis Date  . Acute blood loss anemia   . Adjustment disorder with depressed mood   . CVA (cerebral vascular accident) ([place]) 02/03/2016   Linear infarct within the posterior limb of left internal capsule  . Diastolic CHF ([place])   . Dysphagia   . Dysphonia 02/20/2013  . Essential and other specified forms of tremor 02/20/2013  . HLD (hyperlipidemia)   . HTN (hypertension)   . OAB (overactive bladder)   . Osteoporosis   . Patient receiving subcutaneous heparin    For DVT prophylaxis 9/17  . Prediabetes   . Psoriasis   . Slow transit constipation   . Stroke (cerebrum) ([place]) 02/01/2016  . Trigeminal neuralgia 02/20/2013    Past Surgical History:  Procedure Laterality Date  . APPENDECTOMY  1940  . bladder tack    . CATARACT EXTRACTION Bilateral   . HEMORRHOID SURGERY  1960  . HUMERUS FRACTURE SURGERY    . LAPAROSCOPIC HYSTERECTOMY  2006  . torn rotator cuff    . WRIST FRACTURE SURGERY  2005   seconday shoulder 2001    There were no vitals filed for this visit.  Subjective Assessment - 11/09/17 1454    Subjective   I feel like I have made so much progress     Patient is accompained by:  Family member husband    Pertinent History  L MCA 01/2016, Multiple bilateral CVA's 03/2017, HTN, CHF    Currently in Pain?  No/denies                    OT Treatments/Exercises (OP) - 11/09/17 0001      ADLs   Functional Mobility  Addressed functional ambulation in kitchen as well in clinic using walker safely to move objects while ambulating.      ADL Comments  Checked remaining goals today - pt able to use RUE with LUE to wash counter tops with cues about where to position her body relative to the counter. Pt also able to use RUE to open drawers and lower cabinets in standing. Pt reports this weekend she went to a party and ate full plate of finger foods using her RUE.  Pt also able to use her RUE to get silverward out of a drawer and to replace silverware into tray. Pt reports at home she is rinsing dishes and placing them on the counter and then her husband loads the dishwasher.                 OT Short Term Goals - 11/09/17 1532      OT SHORT TERM GOAL #1   Title  Patient will complete a home exercise program designed to improve range of motion in right arm-  with min prompting.  Due 08/25/17    Status  Achieved      OT SHORT TERM GOAL #2   Title  Patient will complete a home activity program designed to increase functional use of right UE with min prompting    Status  Achieved      OT SHORT TERM GOAL #3   Title  Patient will stand at counter and remove 2 lightweight items (silverware, lightweight saucer)  from lower rack of dishwasher with close supervision    Status  Achieved      OT SHORT TERM GOAL #4   Title  Patient will reach forward to to obtain a lightweight item (shirt) from lower surface while seated demonstrating 45 degrees of shoulder flexion with sufficient hand opening and grasp.      Status  Achieved      OT SHORT TERM GOAL #5   Title  Pt will be able to cut food using bilateral upper extremities and min assist    Status  Not Met      OT SHORT TERM GOAL #6   Title  Patient will bring finger food to mouth once placed in right hand    Status  Achieved        OT Long Term Goals  - 11/09/17 1533      OT LONG TERM GOAL #1   Title  Patient will complete an HEP designed to improve RUE AROM/PROM with mod I due 10/09/2017   Status  Achieved      OT LONG TERM GOAL #2   Title  Patient will independently complete a home activity program designed to improve right UE functional ability - check 10/22/17    Status  Achieved      OT LONG TERM GOAL #3   Title  Pt will be able to write name legibly with LUE and AE prn    Status  Achieved      OT LONG TERM GOAL #4   Title  Patient will load 2-3 lightweight items into dishwasher after each meal- using right upper extremity- check 10/22/17    Status  Achieved      OT LONG TERM GOAL #5   Title  Patient will use RUE to wipe down countertops in kitchen after meals to assist with clean up     Status  Achieved      [place] #6   Title  Patient will wash lightweight dishes after meals using BUE's    Status  Deferred      OT LONG TERM GOAL #7   Title  Patient will demonstrate ability to use right arm to open cupboard doors and drawers in her home check 10/22/17    Status  Achieved      OT LONG TERM GOAL #8   Title  Pt will feed herself finger foods at least 50% of the time with RUE.- check 10/22/17    Status  Achieved      OT LONG TERM GOAL  #9   Baseline  Pt will be able to manipulate 1 inch object in R hand with at least 75% accuracy.    Status  Achieved            Plan - 11/09/17 1533    Clinical Impression Statement  Pt has met all goals and is ready for discharge. Pt stated "I feel like I have made so much progress - this place is amazing."    Occupational Profile and  client history currently impacting functional performance  Wife, mother, fiercely independent prior to CVA's, active in church    Occupational performance deficits (Please refer to evaluation for details):  ADL's;IADL's;Social Participation;Leisure    Rehab Potential  Good    OT Frequency  2x / week    OT Duration  4 weeks    OT  Treatment/Interventions  Moist Heat;Fluidtherapy;DME and/or AE instruction;Splinting;Balance training;Therapeutic activities;Therapeutic exercise;Cognitive remediation/compensation;Neuromuscular education;Functional Mobility Training;Patient/family education;Manual Therapy;Paraffin;Electrical Stimulation;Self-care/ADL training    Plan  d/c from OT today    Consulted and Agree with Plan of Care  Patient;Family member/caregiver    Family Member Consulted  husband       Patient will benefit from skilled therapeutic intervention in order to improve the following deficits and impairments:  Decreased coordination, Decreased range of motion, Difficulty walking, Impaired flexibility, Improper body mechanics, Decreased safety awareness, Increased edema, Impaired sensation, Decreased activity tolerance, Impaired tone, Decreased balance, Decreased knowledge of use of DME, Impaired UE functional use, Decreased cognition, Decreased mobility, Decreased strength, Impaired perceived functional ability  Visit Diagnosis: Muscle weakness (generalized) - Plan: Ot plan of care cert/re-cert  Unsteadiness on feet - Plan: Ot plan of care cert/re-cert  Spastic hemiplegia of right dominant side as late effect of cerebral infarction [facility]) - Plan: Ot plan of care cert/re-cert  Stiffness of right hand, not elsewhere classified - Plan: Ot plan of care cert/re-cert  Other symptoms and signs involving the nervous system - Plan: Ot plan of care cert/re-cert  Abnormal posture - Plan: Ot plan of care cert/re-cert  Stiffness of right shoulder, not elsewhere classified - Plan: Ot plan of care cert/re-cert  Other lack of coordination - Plan: Ot plan of care cert/re-cert  Cognitive social or emotional deficit following cerebral infarction - Plan: Ot plan of care cert/re-cert    Problem List Patient Active Problem List   Diagnosis Date Noted  . Right spastic hemiplegia ([place]) 10/13/2017  . Acute lower UTI   . Labile blood  pressure   . Abnormal urinalysis   . Trauma   . Closed compression fracture of L4 lumbar vertebra   . Neurogenic bladder   . History of hypertension   . Anemia of chronic disease   . Hypoalbuminemia due to protein-calorie malnutrition ([place])   . Acute bilateral cerebral infarction in a watershed distribution [facility]) 04/05/2017  . Benign essential HTN   . Chronic diastolic heart failure ([place])   . Right spastic hemiparesis ([place])   . History of stroke 02/16/2017  . Abnormality of gait following cerebrovascular accident (CVA) 02/16/2017  . Slow transit constipation   . Acute blood loss anemia   . Adjustment disorder with depressed mood   . Cerebrovascular accident (CVA) ([place]) 02/03/2016  . Dysarthria, post-stroke   . Dysphagia, post-stroke   . Leukocytosis   . Prediabetes   . Right hemiparesis ([place])   . Cerebral infarction due to unspecified mechanism   . Other secondary hypertension   . Overactive bladder   . Diastolic dysfunction   . Hyperlipidemia   . Essential hypertension   . Essential tremor   . Stroke (cerebrum) ([place]) 02/01/2016  . Facial droop due to stroke 02/01/2016  . Essential and other specified forms of tremor 02/20/2013  . Dysphonia 02/20/2013  . Trigeminal neuralgia 02/20/2013   OCCUPATIONAL THERAPY DISCHARGE SUMMARY  Visits from Start of Care: 25  Current functional level related to goals / functional outcomes: See above   Remaining deficits: Spastic hemiplegia, decreased balance, higher level cognitive deficits  Education / Equipment: HEP, splint Plan: Patient agrees to discharge.  Patient goals were met. Patient is being discharged due to meeting the stated rehab goals.  ?????      Quay Burow, OTR/L 11/09/2017, 3:45 PM  [facility] [address] [place], [place], [address] Phone: [phone]   Fax:  [phone]  Name: Kelly [name] MRN: [id number] Date of Birth:  [birth year]

## 2017-11-10 ENCOUNTER — Encounter: Payer: Self-pay | Admitting: Physical Therapy

## 2017-11-10 NOTE — Therapy (Signed)
[facility] [address] [place], [place], [address] Phone: [phone]   Fax:  [phone]  Physical Therapy Treatment  Patient Details  Name: Kelly [name] MRN: [id number] Date of Birth: [birth year] Referring Provider: Dr Leonie Man   Encounter Date: 11/09/2017  PT End of Session - 11/10/17 2005    Visit Number  27    Number of Visits  31    Date for PT Re-Evaluation  11/19/17    Authorization Type  Medicare/[facility]    Authorization Time Period  07-26-17 - 09-24-17; 09-24-17 - 11-24-17; 10-22-17 to 01/20/2018    PT Start Time  1093    PT Stop Time  1447    PT Time Calculation (min)  44 min       Past Medical History:  Diagnosis Date  . Acute blood loss anemia   . Adjustment disorder with depressed mood   . CVA (cerebral vascular accident) ([place]) 02/03/2016   Linear infarct within the posterior limb of left internal capsule  . Diastolic CHF ([place])   . Dysphagia   . Dysphonia 02/20/2013  . Essential and other specified forms of tremor 02/20/2013  . HLD (hyperlipidemia)   . HTN (hypertension)   . OAB (overactive bladder)   . Osteoporosis   . Patient receiving subcutaneous heparin    For DVT prophylaxis 9/17  . Prediabetes   . Psoriasis   . Slow transit constipation   . Stroke (cerebrum) ([place]) 02/01/2016  . Trigeminal neuralgia 02/20/2013    Past Surgical History:  Procedure Laterality Date  . APPENDECTOMY  1940  . bladder tack    . CATARACT EXTRACTION Bilateral   . HEMORRHOID SURGERY  1960  . HUMERUS FRACTURE SURGERY    . LAPAROSCOPIC HYSTERECTOMY  2006  . torn rotator cuff    . WRIST FRACTURE SURGERY  2005   seconday shoulder 2001    There were no vitals filed for this visit.  Subjective Assessment - 11/10/17 1957    Subjective  "this is going to be my last day" - pt states her husband has several appts coming up so she has decided to finish up today    Patient is accompained by:  Family member    Pertinent  History  Rt MCA CVA on 02-03-16:  Cryptogenic CVA on 04-05-17; pt received OP PT at this facility from 06-01-16 - 11-19-16    Patient Stated Goals  "I would like to walk in the park"     Currently in Pain?  No/denies                       [facility] Adult PT Treatment/Exercise - 11/10/17 0001      Transfers   Transfers  Sit to Stand;Stand to Sit    Sit to Stand  4: Min guard    Stand to Sit  4: Min guard      Ambulation/Gait   Ambulation/Gait  Yes    Ambulation/Gait Assistance  5: Supervision    Ambulation/Gait Assistance Details  cues for upright posture    Ambulation Distance (Feet)  425 Feet in 6" walk test    Assistive device  Rolling walker    Gait Pattern  Decreased hip/knee flexion - right;Decreased dorsiflexion - right;Decreased weight shift to right;Decreased step length - right;Right genu recurvatum;Trunk flexed    Ambulation Surface  Level;Indoor    Gait velocity  26.10 secs = 1.26 ft/sec iwth RW  Knee/Hip Exercises: Aerobic   Other Aerobic  level 1.5 x 5" due to time constraint       Knee/Hip Exercises: Standing   Forward Step Up  Right;1 set;10 reps;Hand Hold: 1;Step Height: 6"               PT Short Term Goals - 08/24/17 1457      PT SHORT TERM GOAL #1   Title  Pt will perform sit to stand 5 reps from mat without UE support to demo improved LE strength.    Baseline  Pt able to perform without UE support but stabilizes legs against mat table upon initial standing -08-24-17    Status  Partially Met      PT SHORT TERM GOAL #2   Title  Pt will receive consult for custom AFO for RLE.    Baseline  scheduled at [facility] on 07-27-17; not yet received -- 08-24-17    Status  On-going      PT SHORT TERM GOAL #3   Title  Pt will stand for at least 2" without UE support with SBA to increase independence with ADL's.    Baseline  met 08-24-17    Status  Achieved      PT SHORT TERM GOAL #4   Title  Incr. gait velocity to >/= .9 ft/sec with RW for incr. gait  efficiency.    Baseline  .23 ft/sec with RW (no AFO used); 1.50 ft/sec with RW - 08-24-17    Status  Achieved      PT SHORT TERM GOAL #5   Title  Pt will amb. with RW 350' with SBA on flat, even surface.    Baseline  met 08-24-17    Status  Achieved        PT Long Term Goals - 11/09/17 1411      PT LONG TERM GOAL #1   Title  Pt will increase gait velocity from .54 ft/sec to >/= 1.2 ft/sec with RW with AFO on RLE for incr. gait efficiency.    Status  Achieved      PT LONG TERM GOAL #2   Title  Pt will stand for at least 10" with UE support prn and reach 6" outside BOS without LOB for incr. independence and safety with ADL's.    Status  Achieved      PT LONG TERM GOAL #3   Title  Pt will negotiate 4 steps with Lt hand rail with CGA using a step by step sequence.    Status  Partially Met      PT LONG TERM GOAL #4   Title  Independent in HEP for RLE strengthening and balance exercises.    Status  Achieved      PT LONG TERM GOAL #5   Title  Pt will amb. 500' outside with RW with AFO on RLE with SBA to allow pt to return walking in the park per her stated goal.    Status  Achieved      PT LONG TERM GOAL #6   Title  Patient will increase gait velocity to >= 1.81 ft/sec (threshold for lower fall risk) (Target for new LTGs 11/19/17)    Baseline  5/28  1.52 ft/sec=        26.10 secs on 11-09-17 = 1.26 ft/sec     Status  Not Met      PT LONG TERM GOAL #7   Title  Patient will ambulate 500 ft in <6 minutes over  level indoor or outdoor surfaces (dependent on weather) with close supervision to allow walking into church and to classroom.     Baseline  5/31  452f == 425' on 11-09-17 with RW    Status  Not Met      PT LONG TERM GOAL #8   Title  Patient will be able to maintain >50 steps/min on seated stepper x 15 minutes to demonstrate improved muscular endurance.    Baseline  Not tested due to time constraint - pt did not have time to perform 15"     Status  Unable to assess             Plan - 11/10/17 2006    Clinical Impression Statement  Pt has partially met LTG's; pt able to amb. 6" nonstop, but at slow speed - pt did not meet the stated distance of >500' during 6" time period. Gait velocity goal not met as gait speed is 1.26 ft/sec with RW as assessed during today's visit.  Pt is requesting D/C  at this time due to husband's scheduled appts within next few days - pt D/C'g one week early from PT.          Patient will benefit from skilled therapeutic intervention in order to improve the following deficits and impairments:  Abnormal gait, Decreased endurance, Decreased activity tolerance, Decreased balance, Decreased coordination, Decreased range of motion, Decreased strength, Impaired tone, Impaired UE functional use, Postural dysfunction  Visit Diagnosis: Muscle weakness (generalized)  Unsteadiness on feet  Other abnormalities of gait and mobility     Problem List Patient Active Problem List   Diagnosis Date Noted  . Right spastic hemiplegia ([facility] 10/13/2017  . Acute lower UTI   . Labile blood pressure   . Abnormal urinalysis   . Trauma   . Closed compression fracture of L4 lumbar vertebra   . Neurogenic bladder   . History of hypertension   . Anemia of chronic disease   . Hypoalbuminemia due to protein-calorie malnutrition ([place]   . Acute bilateral cerebral infarction in a watershed distribution ([facility]) 04/05/2017  . Benign essential HTN   . Chronic diastolic heart failure ([place]   . Right spastic hemiparesis ([place]   . History of stroke 02/16/2017  . Abnormality of gait following cerebrovascular accident (CVA) 02/16/2017  . Slow transit constipation   . Acute blood loss anemia   . Adjustment disorder with depressed mood   . Cerebro[place] ([place]A) ([place] 02/03/2016  . Dysarthria, post-stroke   . Dysphagia, post-stroke   . Leukocytosis   . Prediabetes   . Right hemiparesis ([place]   . Cerebral infarction due to unspecified mechanism    . Other secondary hypertension   . Overactive bladder   . Diastolic dysfunction   . Hyperlipidemia   . Essential hypertension   . Essential tremor   . Stroke (cerebrum) ([place] 02/01/2016  . Facial droop due to stroke 02/01/2016  . Essential and other specified forms of tremor 02/20/2013  . Dysphonia 02/20/2013  . Trigeminal neuralgia 02/20/2013      PHYSICAL THERAPY DISCHARGE SUMMARY  Visits from Start of Care: 27  Current functional level related to goals / functional outcomes: See above for progress towards goals   Remaining deficits: Continued Rt hemiparesis with spasticity in RUE  Cont. Decreased standing balance and decr. Independence and safety with gait; pt has obtained custom AFO for RLE which reduces Rt genu recurvatum in stance phase of gait    Education / Equipment: Pt  has been instructed in HEP for balance and strengthening exercises Plan: Patient agrees to discharge.  Patient goals were partially met. Patient is being discharged due to the patient's request.  ?????           Alda Lea, PT 11/10/2017, [facility] [address] [place], [place], [address] Phone: [phone]   Fax:  [phone]  Name: Kelly [name] MRN: [id number] Date of Birth: 07-16-1927

## 2017-11-11 ENCOUNTER — Ambulatory Visit: Payer: Medicare Other | Admitting: Physical Therapy

## 2017-11-11 ENCOUNTER — Ambulatory Visit: Payer: Medicare Other | Admitting: Occupational Therapy

## 2017-11-16 ENCOUNTER — Encounter: Payer: Medicare Other | Admitting: Occupational Therapy

## 2017-11-16 ENCOUNTER — Ambulatory Visit: Payer: Medicare Other | Admitting: Physical Therapy

## 2017-11-16 DIAGNOSIS — S31000A Unspecified open wound of lower back and pelvis without penetration into retroperitoneum, initial encounter: Secondary | ICD-10-CM | POA: Diagnosis not present

## 2017-11-18 ENCOUNTER — Ambulatory Visit: Payer: Medicare Other | Admitting: Physical Therapy

## 2017-11-18 ENCOUNTER — Encounter: Payer: Medicare Other | Admitting: Occupational Therapy

## 2017-11-23 DIAGNOSIS — S31000A Unspecified open wound of lower back and pelvis without penetration into retroperitoneum, initial encounter: Secondary | ICD-10-CM | POA: Diagnosis not present

## 2017-11-23 DIAGNOSIS — N3281 Overactive bladder: Secondary | ICD-10-CM | POA: Diagnosis not present

## 2017-11-24 ENCOUNTER — Encounter: Payer: Medicare Other | Attending: Physical Medicine & Rehabilitation | Admitting: Physical Medicine & Rehabilitation

## 2017-11-24 ENCOUNTER — Encounter: Payer: Self-pay | Admitting: Physical Medicine & Rehabilitation

## 2017-11-24 VITALS — BP 121/69 | HR 71 | Resp 14 | Ht 60.0 in | Wt 95.0 lb

## 2017-11-24 DIAGNOSIS — E785 Hyperlipidemia, unspecified: Secondary | ICD-10-CM | POA: Diagnosis not present

## 2017-11-24 DIAGNOSIS — G250 Essential tremor: Secondary | ICD-10-CM | POA: Insufficient documentation

## 2017-11-24 DIAGNOSIS — M810 Age-related osteoporosis without current pathological fracture: Secondary | ICD-10-CM | POA: Insufficient documentation

## 2017-11-24 DIAGNOSIS — G500 Trigeminal neuralgia: Secondary | ICD-10-CM | POA: Diagnosis not present

## 2017-11-24 DIAGNOSIS — I6389 Other cerebral infarction: Secondary | ICD-10-CM

## 2017-11-24 DIAGNOSIS — L409 Psoriasis, unspecified: Secondary | ICD-10-CM | POA: Diagnosis not present

## 2017-11-24 DIAGNOSIS — N3281 Overactive bladder: Secondary | ICD-10-CM | POA: Diagnosis not present

## 2017-11-24 DIAGNOSIS — I5030 Unspecified diastolic (congestive) heart failure: Secondary | ICD-10-CM | POA: Insufficient documentation

## 2017-11-24 DIAGNOSIS — R269 Unspecified abnormalities of gait and mobility: Secondary | ICD-10-CM | POA: Diagnosis not present

## 2017-11-24 DIAGNOSIS — R7303 Prediabetes: Secondary | ICD-10-CM | POA: Insufficient documentation

## 2017-11-24 DIAGNOSIS — G8111 Spastic hemiplegia affecting right dominant side: Secondary | ICD-10-CM

## 2017-11-24 DIAGNOSIS — S32040S Wedge compression fracture of fourth lumbar vertebra, sequela: Secondary | ICD-10-CM | POA: Diagnosis not present

## 2017-11-24 DIAGNOSIS — I69351 Hemiplegia and hemiparesis following cerebral infarction affecting right dominant side: Secondary | ICD-10-CM | POA: Insufficient documentation

## 2017-11-24 DIAGNOSIS — F4321 Adjustment disorder with depressed mood: Secondary | ICD-10-CM | POA: Diagnosis not present

## 2017-11-24 DIAGNOSIS — I110 Hypertensive heart disease with heart failure: Secondary | ICD-10-CM | POA: Diagnosis not present

## 2017-11-24 NOTE — Progress Notes (Signed)
Subjective:    Patient ID: Kelly [name], female    DOB: 10/02/1927, 84 y.o.   MRN: [id number]  HPI 84 year old right-handed female with history of essential tremor presents for follow up for bilateral infarcts.  Last clinic visit 10/13/17.  She had Botox injection at that time. Husband present, who provides majority of history. Pt notes decrease in tone with the ability to open her hand.  Denies falls. Urination is normal.  She denies trigeminal neuralgia pain.    Pain Inventory Average Pain 0 Pain Right Now 0 My pain is  no pain  In the last 24 hours, has pain interfered with the following? General activity 0 Relation with others 0 Enjoyment of life 0 What TIME of day is your pain at its worst? no pain Sleep (in general) Good  Pain is worse with: no pain Pain improves with: no pain Relief from Meds: no meds  Mobility walk with assistance use a walker how many minutes can you walk? 10 ability to climb steps?  no do you drive?  no  Function retired I need assistance with the following:  dressing, bathing and household duties  Neuro/Psych bladder control problems trouble walking  Prior Studies Any changes since last visit?  no  Physicians involved in your care Any changes since last visit?  no   Family History  Problem Relation Age of Onset  . Arthritis Brother        spine  . Osteoporosis Maternal Grandmother    Social History   Socioeconomic History  . Marital status: Married    Spouse name: [name]  . Number of children: 3  . Years of education: college  . Highest education level: Not on file  Occupational History  . Occupation: real estate    Comment: retired  Engineer, production  . Financial resource strain: Not very hard  . Food insecurity:    Worry: Patient refused    Inability: Patient refused  . Transportation needs:    Medical: Patient refused    Non-medical: Patient refused  Tobacco Use  . Smoking status: Never Smoker  . Smokeless tobacco:  Never Used  Substance and Sexual Activity  . Alcohol use: Yes    Comment: Wine 2 glass daily occasionally  . Drug use: No  . Sexual activity: Not Currently    Partners: Male    Birth control/protection: Post-menopausal  Lifestyle  . Physical activity:    Days per week: Not on file    Minutes per session: Not on file  . Stress: Not on file  Relationships  . Social connections:    Talks on phone: Not on file    Gets together: Not on file    Attends religious service: Not on file    Active member of club or organization: Not on file    Attends meetings of clubs or organizations: Not on file    Relationship status: Not on file  Other Topics Concern  . Not on file  Social History Narrative   Lives at home with her husband.   Right-handed.   No caffeine use.   Past Surgical History:  Procedure Laterality Date  . APPENDECTOMY  1940  . bladder tack    . CATARACT EXTRACTION Bilateral   . HEMORRHOID SURGERY  1960  . HUMERUS FRACTURE SURGERY    . LAPAROSCOPIC HYSTERECTOMY  2006  . torn rotator cuff    . WRIST FRACTURE SURGERY  2005   seconday shoulder 2001   Past Medical History:  Diagnosis Date  . Acute blood loss anemia   . Adjustment disorder with depressed mood   . CVA (cerebral vascular accident) (HCC) 02/03/2016   Linear infarct within the posterior limb of left internal capsule  . Diastolic CHF (HCC)   . Dysphagia   . Dysphonia 02/20/2013  . Essential and other specified forms of tremor 02/20/2013  . HLD (hyperlipidemia)   . HTN (hypertension)   . OAB (overactive bladder)   . Osteoporosis   . Patient receiving subcutaneous heparin    For DVT prophylaxis 9/17  . Prediabetes   . Psoriasis   . Slow transit constipation   . Stroke (cerebrum) (HCC) 02/01/2016  . Trigeminal neuralgia 02/20/2013   BP 121/69   Pulse 71   Resp 14   Ht 5' (1.524 m)   Wt 95 lb (43.1 kg)   SpO2 97%   BMI 18.55 kg/m   Opioid Risk Score:   Fall Risk Score:  `1  Depression screen  PHQ 2/9  [facility] 10/13/2017 12/10/2016 04/24/2016  Decreased Interest 0 0 0  Down, Depressed, Hopeless - 0 0  PHQ - 2 Score 0 0 0  Altered sleeping - - 0  Tired, decreased energy - - 0  Change in appetite - - 0  Feeling bad or failure about yourself  - - 0  Trouble concentrating - - 0  Moving slowly or fidgety/restless - - 0  Suicidal thoughts - - 0  PHQ-9 Score - - 0    Review of Systems  Constitutional: Negative.   HENT: Negative.   Eyes: Negative.   Respiratory: Negative.   Cardiovascular: Negative.   Gastrointestinal: Negative.        Incontinence  Endocrine: Negative.   Genitourinary: Positive for difficulty urinating.  Musculoskeletal: Positive for arthralgias and gait problem.  Skin: Negative.   Allergic/Immunologic: Negative.   Hematological: Bruises/bleeds easily.  Psychiatric/Behavioral: Negative.   All other systems reviewed and are negative.     Objective:   Physical Exam Constitutional: She appears well-developed. Frail. NAD.  HENT: Normocephalic and atraumatic.  Eyes: EOMI. No discharge..  Cardiovascular: RRR. No JVD. Respiratory: Effort normal and breath sounds normal.  GI: Soft. Bowel sounds are normal.  Musculoskeletal: She exhibits no edema or tenderness.  Gait: Slow cadence with kyphotic posture Neurological: She is alert and oriented.  HOH Mild dysarthria.  Right facial droop +LUE Resting tremor Motor: RUE: shoulder abduction, elbow flexion/extension 4/5, wrist 4/5, hand 4/5 RLE: 4+/5 hip flexion, knee extension, 2+/5 ankle dorsi/plantar flexion MAS: right elbow flexors 1/4, wrist flexors 1/4, finger flexors 1/4.  Psychiatric: She has a normal mood and affect. Her behavior is normal Skin: Skin is warm and dry    Assessment & Plan:  84 year old right-handed female with history of essential tremor presents for follow for bilateral infarcts.  1. Late effects bilateral infarct with spastic hemiplegia affecting right dominant  side (G81.11).  Cont Botox injection:   Right Biceps 50U   Right FCR 50U   Right FDS 80U   Right FDP 20U  Cont HEP  2. Gait abnormality  Cont HEP  Cont walker for safety  3. Neurogenic bladder.   Improving  4. Trigeminal neuralgia.    Wean Topamax, no symptoms at present  5. Lumbar compression fracture  Improved  Cont conservative care

## 2017-12-17 DIAGNOSIS — E559 Vitamin D deficiency, unspecified: Secondary | ICD-10-CM | POA: Diagnosis not present

## 2017-12-17 DIAGNOSIS — E7800 Pure hypercholesterolemia, unspecified: Secondary | ICD-10-CM | POA: Diagnosis not present

## 2017-12-24 DIAGNOSIS — R82998 Other abnormal findings in urine: Secondary | ICD-10-CM | POA: Diagnosis not present

## 2017-12-24 DIAGNOSIS — N3281 Overactive bladder: Secondary | ICD-10-CM | POA: Diagnosis not present

## 2017-12-24 DIAGNOSIS — E7800 Pure hypercholesterolemia, unspecified: Secondary | ICD-10-CM | POA: Diagnosis not present

## 2017-12-24 DIAGNOSIS — Z681 Body mass index (BMI) 19 or less, adult: Secondary | ICD-10-CM | POA: Diagnosis not present

## 2017-12-24 DIAGNOSIS — M1611 Unilateral primary osteoarthritis, right hip: Secondary | ICD-10-CM | POA: Diagnosis not present

## 2017-12-24 DIAGNOSIS — H9193 Unspecified hearing loss, bilateral: Secondary | ICD-10-CM | POA: Diagnosis not present

## 2017-12-24 DIAGNOSIS — Z0000 Encounter for general adult medical examination without abnormal findings: Secondary | ICD-10-CM | POA: Diagnosis not present

## 2017-12-24 DIAGNOSIS — K219 Gastro-esophageal reflux disease without esophagitis: Secondary | ICD-10-CM | POA: Diagnosis not present

## 2017-12-24 DIAGNOSIS — G8111 Spastic hemiplegia affecting right dominant side: Secondary | ICD-10-CM | POA: Diagnosis not present

## 2017-12-24 DIAGNOSIS — Z1389 Encounter for screening for other disorder: Secondary | ICD-10-CM | POA: Diagnosis not present

## 2017-12-24 DIAGNOSIS — E559 Vitamin D deficiency, unspecified: Secondary | ICD-10-CM | POA: Diagnosis not present

## 2017-12-24 DIAGNOSIS — I6789 Other cerebrovascular disease: Secondary | ICD-10-CM | POA: Diagnosis not present

## 2018-01-13 ENCOUNTER — Encounter: Payer: Self-pay | Admitting: Physical Medicine & Rehabilitation

## 2018-01-13 ENCOUNTER — Other Ambulatory Visit: Payer: Self-pay

## 2018-01-13 ENCOUNTER — Encounter: Payer: Medicare Other | Attending: Physical Medicine & Rehabilitation | Admitting: Physical Medicine & Rehabilitation

## 2018-01-13 VITALS — BP 118/69 | HR 60 | Ht 60.0 in | Wt 95.0 lb

## 2018-01-13 DIAGNOSIS — G8111 Spastic hemiplegia affecting right dominant side: Secondary | ICD-10-CM | POA: Insufficient documentation

## 2018-01-13 NOTE — Progress Notes (Signed)
Botox: Procedure Note Patient Name: Kelly [name] DOB: Apr 09, 1928 MRN: [id number]  Date: 01/13/18  Procedure: Botulinum toxin administration Guidance: EMG Diagnosis: G81.11 Attending: Maryla MorrowAnkit Danyl Deems, MD    Trade name: Botox (onabotulinumtoxinA)  Informed consent: Risks, benefits & options of the procedure are explained to the patient (and/or family). The patient elects to proceed with procedure. Risks include but are not limited to weakness, respiratory distress, dry mouth, ptosis, antibody formation, worsening of some areas of function. Benefits include decreased abnormal muscle tone, improved hygiene and positioning, decreased skin breakdown and, in some cases, decreased pain. Options include conservative management with oral antispasticity agents, phenol chemodenervation of nerve or at motor nerve branches. More invasive options include intrathecal balcofen adminstration for appropriate candidates. Surgical options may include tendon lengthening or transposition or, rarely, dorsal rhizotomy.   History/Physical Examination: 84 y.o. spastic hemiplegia affecting right dominant side after left internal capsule CVA.  mAS  Right elbow flexors 1/4   Right wrist flexors 1/4   Right finger flexors 1+/4 Previous Treatments: Failed Baclofen.  Therapy/Range of motion Indication for guidance: Target active muscules  Procedure: Botulinum toxin was mixed with preservative free saline with a dilution of 1cc to 100 units. Targeted limb and muscles were identified. The skin was prepped with alcohol swabs and placement of needle tip in targeted muscle was confirmed using appropriate guidance. Prior to injection, positioning of needle tip outside of blood vessel was determined by pulling back on syringe plunger.  MUSCLE UNITS: Right Biceps 50U Right FCR 25U Right FCU 25U Right FDS 80U Right FDP 20U   Total units used: 200  Complications: None Plan: Follow up in 3 months  [name] Anil Da Michelle  11:26  AM

## 2018-02-19 DIAGNOSIS — Z23 Encounter for immunization: Secondary | ICD-10-CM | POA: Diagnosis not present

## 2018-04-06 ENCOUNTER — Ambulatory Visit (INDEPENDENT_AMBULATORY_CARE_PROVIDER_SITE_OTHER): Payer: Medicare Other | Admitting: Adult Health

## 2018-04-06 ENCOUNTER — Encounter: Payer: Self-pay | Admitting: Adult Health

## 2018-04-06 VITALS — BP 107/64 | HR 92

## 2018-04-06 DIAGNOSIS — I6389 Other cerebral infarction: Secondary | ICD-10-CM | POA: Diagnosis not present

## 2018-04-06 DIAGNOSIS — G8111 Spastic hemiplegia affecting right dominant side: Secondary | ICD-10-CM | POA: Diagnosis not present

## 2018-04-06 DIAGNOSIS — I10 Essential (primary) hypertension: Secondary | ICD-10-CM | POA: Diagnosis not present

## 2018-04-06 DIAGNOSIS — E785 Hyperlipidemia, unspecified: Secondary | ICD-10-CM

## 2018-04-06 IMAGING — CR DG CHEST 2V
2 series · 2 of 2 positions shown · non-contrast
Comparison: 02/01/2016

CLINICAL DATA: Code stroke, crackles on lung exam

EXAM:
CHEST  2 VIEW

[chest lat]
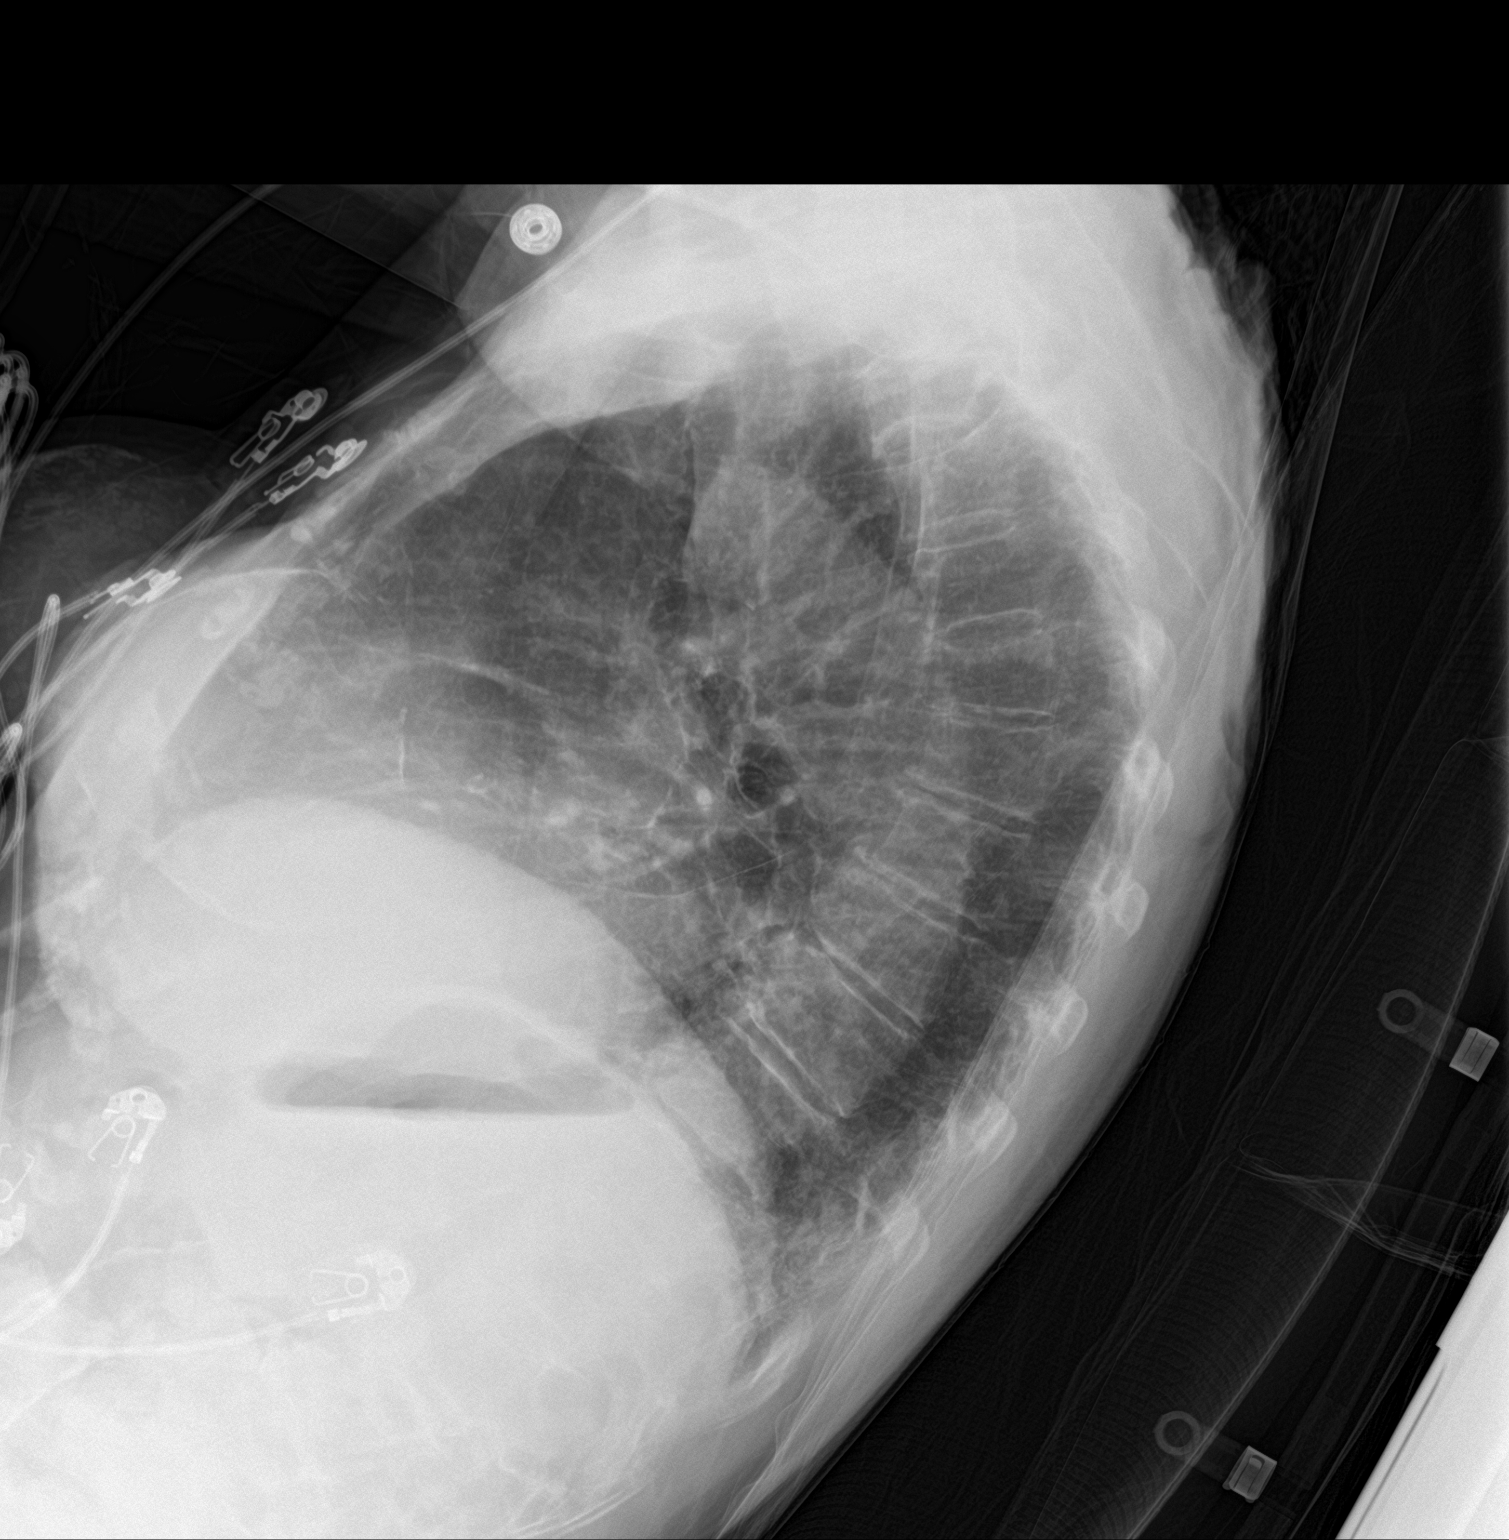

[chest ap]
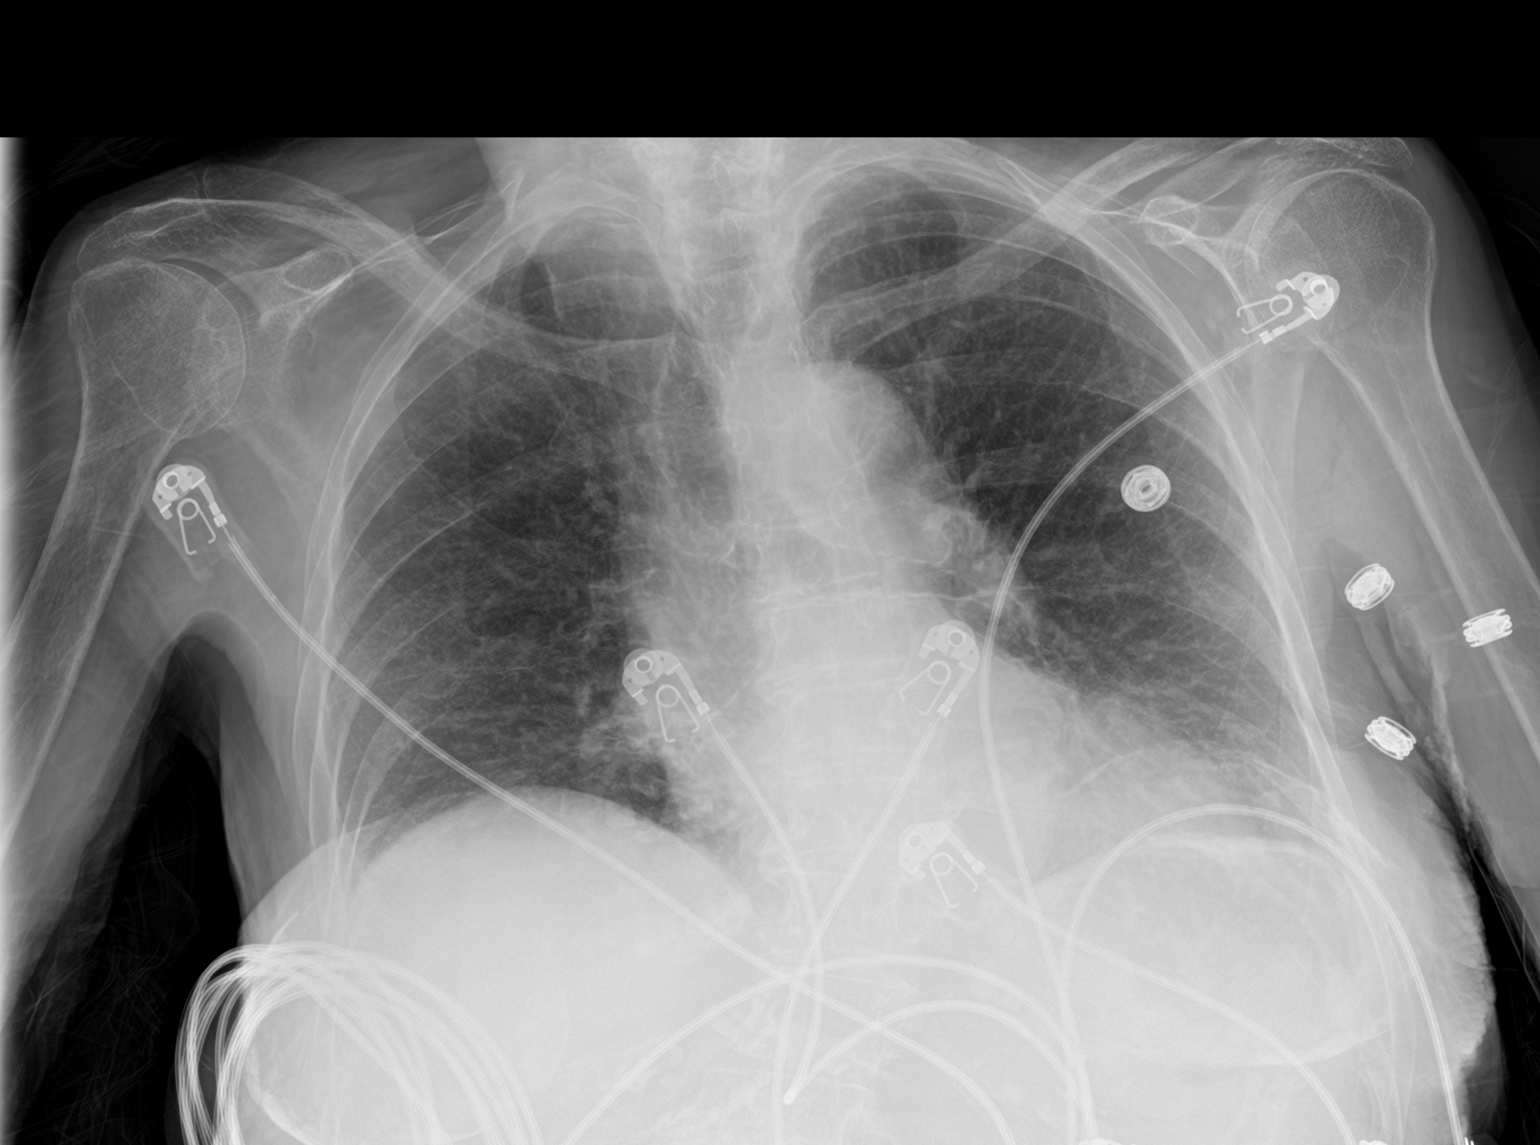

[2 of 2 positions shown; findings below may reference images not displayed]

FINDINGS: Low lung volumes. No focal pulmonary infiltrate, consolidation or
effusion. Cardiomediastinal silhouette within normal limits. Aortic
atherosclerosis. No pneumothorax. Calcified breast implants. Stable
mild wedge deformities of mid to upper thoracic vertebra.
IMPRESSION: No active cardiopulmonary disease.

## 2018-04-06 NOTE — Patient Instructions (Signed)
Continue clopidogrel 75 mg daily  and lipitor  for secondary stroke prevention  Referral placed for PT/OT due to possible deconditioning and worsening in right side weakness  Follow up with Dr. Allena KatzPatel as scheduled  Continue to follow up with PCP regarding cholesterol and blood pressure management   Increase activity level slowly to help with deconditioning of muscles   Maintain strict control of hypertension with blood pressure goal below 130/90, diabetes with hemoglobin A1c goal below 6.5% and cholesterol with LDL cholesterol (bad cholesterol) goal below 70 mg/dL. I also advised the patient to eat a healthy diet with plenty of whole grains, cereals, fruits and vegetables, exercise regularly and maintain ideal body weight.  Followup in the future with me as needed. Please call with questions, concerns or need of future follow up appointment       [facility] you [facility]. I hope we have been able to provide you high quality care today.  You may receive a patient satisfaction survey over the next few weeks. We would appreciate your feedback and comments so that we may continue to improve ourselves and the health of our patients.

## 2018-04-06 NOTE — Progress Notes (Signed)
[facility] [address] [place] [id number] [phone]       OFFICE FOLLOW-UP VISIT NOTE  [name] Date of Birth:  09-Aug-1927 Medical Record Number:  [id number]   Referring MD: Cruzita Ledereraniel Anguilli, PA-C Reason for Referral: stroke f/u HPI: Kelly [name] is a pleasant 84 year old Caucasian lady who is accompanied today by her husband and seen for  follow-up visit for stroke in November 2018. History is obtained from them as well as review of electronic medical records. I personally reviewed imaging films. Kelly H Rallsis an 84 y.o.femalewho presented via EMS as a Code Stroke. LKN was 2100 on Saturday night. She was being helped from the bathroom by family when she suddenly was unable to stand due to RLE weakness. Family also noted that she could not move her RUE, so 911 was called. Of note, she has chronic RUE and RLE weakness secondary to prior left MCA stroke. She gets botox injections to her RUE to improve mobility. In CT, the patient stated that her RUE was still weaker than normal, but that she was improving. Her RLE was back to normal per patient. She takes ASA daily. She is not on any anticoagulation. Stroke risk factors include history of stroke, CHF, HLD and HTN.LSN:2100. 04/02/17.tPA Given:No:Rapid symptomatic improvement. Risks of tPA outweigh benefits. MRI scan of the brain showed subacute tiny left parietal and cortex and white matter as well as smaller right frontal and parietal embolic infarcts. MRA of the brain showed no emergent large vessel occlusion or stenosis. Carotid ultrasound showed no significant extracranial stenosis. Transthoracic echo showed normal ejection fraction without cardiac source of embolism. Telemetry monitoring did not show cortex was of embolism. LDL cholesterol is 58 mg percent and hemoglobin A1c was 5.6. Patient strokes were felt to be embolic but she was not felt to be a candidate for long-term anticoagulation given due to  her frail body habitus, significant fall risk and advanced age hence TEE and prolonged cardiac monitoring were not done. Patient was transferred to inpatient rehabilitation where she made steady improvement and she is presently living at home with her husband. She is currently getting home health physical and occupational therapy. She is able to walk with a walker but the husband*walk behind her with a chair. She is starting Plavix well without bleeding or bruising. Her blood pressure is well controlled and today it is 110/78. She is tolerating Lipitor 40 mg well without muscle aches or pains. She's had no falls or injuries. She has a remote history of left internal capsule infarct in September 2017 with some residual right-sided weakness  10/04/17 visit: Patient returns today for follow-up appointment is accompanied by her husband.  She continues to take Plavix without side effects of bleeding.  Continues to take Lipitor without side effects myalgias.  Blood pressure today satisfactory at 100/62 and this is typical for patient.  She will be having Botox injections next Wednesday by Dr. Allena KatzPatel for right upper extremity spasticity.  She is currently living with her husband and has assistance with some ADLs.  She continues to go to PT/OT at our neuro rehab center 2 times per week.  Currently walking with rolling walker for short distances.  Denies new or worsening stroke/TIA symptoms.  Interval history 04/06/2018: Patient is being seen today for six-month follow-up visit and is accompanied by her husband and daughter.  Continues to take Plavix without side effects of bleeding or bruising.  Continues to take atorvastatin without side effects myalgias.  Blood pressure today 107/64.  Husband is concerned as since stopping therapies back in June, she has been showing gradual decreased right sided weakness along with increased difficulty with use and spasticity of right hand.  She did receive Botox injections in 09/2017  and has been feels as though this worsened her hand as she was unable to use it after such as being unable to make a fist or be able to grip walker to ambulate.  He is concerned as she has been having greater difficulty using her right hand, she is not using her rolling walker as much as she was therefore has been having increased weakness in bilateral lower extremities.  She is still able to ambulate with her rolling walker but typically only short distances.  He is concerned as he is afraid she will eventually become completely immobile due to continuing weakness.  No further concerns at this time.  Denies new or worsening stroke/TIA symptoms.     ROS:   14 system review of systems is positive for activity change, incontinence of bladder and walking difficulty and all other systems negative  PMH:  Past Medical History:  Diagnosis Date  . Acute blood loss anemia   . Adjustment disorder with depressed mood   . CVA (cerebral vascular accident) (HCC) 02/03/2016   Linear infarct within the posterior limb of left internal capsule  . Diastolic CHF (HCC)   . Dysphagia   . Dysphonia 02/20/2013  . Essential and other specified forms of tremor 02/20/2013  . HLD (hyperlipidemia)   . HTN (hypertension)   . OAB (overactive bladder)   . Osteoporosis   . Patient receiving subcutaneous heparin    For DVT prophylaxis 9/17  . Prediabetes   . Psoriasis   . Slow transit constipation   . Stroke (cerebrum) (HCC) 02/01/2016  . Trigeminal neuralgia 02/20/2013    Social History:  Social History   Socioeconomic History  . Marital status: Married    Spouse name: [name]  . Number of children: 3  . Years of education: college  . Highest education level: Not on file  Occupational History  . Occupation: real estate    Comment: retired  Engineer, production  . Financial resource strain: Not very hard  . Food insecurity:    Worry: Patient refused    Inability: Patient refused  . Transportation needs:     Medical: Patient refused    Non-medical: Patient refused  Tobacco Use  . Smoking status: Never Smoker  . Smokeless tobacco: Never Used  Substance and Sexual Activity  . Alcohol use: Yes    Comment: Wine 2 glass daily occasionally  . Drug use: No  . Sexual activity: Not Currently    Partners: Male    Birth control/protection: Post-menopausal  Lifestyle  . Physical activity:    Days per week: Not on file    Minutes per session: Not on file  . Stress: Not on file  Relationships  . Social connections:    Talks on phone: Not on file    Gets together: Not on file    Attends religious service: Not on file    Active member of club or organization: Not on file    Attends meetings of clubs or organizations: Not on file    Relationship status: Not on file  . Intimate partner violence:    Fear of current or ex partner: Not on file    Emotionally abused: Not on file    Physically abused: Not on file  Forced sexual activity: Not on file  Other Topics Concern  . Not on file  Social History Narrative   Lives at home with her husband.   Right-handed.   No caffeine use.    Medications:   Current Outpatient Medications on File Prior to Visit  Medication Sig Dispense Refill  . atorvastatin (LIPITOR) 40 MG tablet Take 1 tablet (40 mg total) daily at 6 PM by mouth. 30 tablet 0  . clopidogrel (PLAVIX) 75 MG tablet Take 1 tablet (75 mg total) daily by mouth. 120 tablet 0  . oxybutynin (DITROPAN) 5 MG tablet Take 1 tablet (5 mg total) at bedtime by mouth. 30 tablet 0  . topiramate (TOPAMAX) 50 MG tablet Take 1 tablet (50 mg total) 2 (two) times daily by mouth. 60 tablet 1   No current facility-administered medications on file prior to visit.     Allergies:  No Known Allergies  Vitals:   04/06/18 1346  BP: 107/64  Pulse: 92    Physical Exam General: Frail cachectic pleasant elderly Caucasian lady seated, in no evident distress Head: head normocephalic and atraumatic.   Neck:  supple with no carotid or supraclavicular bruits Cardiovascular: regular rate and rhythm, no murmurs Musculoskeletal: no deformity Skin:  no rash/petichiae Vascular:  Normal pulses all extremities  Neurologic Exam Mental Status: Awake and fully alert. Oriented to place and time. Recent and remote memory diminished. Attention span, concentration and fund of knowledge diminished. Mood and affect appropriate.  Cranial Nerves: Fundoscopic exam reveals sharp disc margins. Pupils equal, briskly reactive to light. Extraocular movements full without nystagmus. Visual fields full to confrontation. Hearing slightly diminished. Facial sensation intact., tongue, palate and face moves normally and symmetrically.  Motor: RUE: 3+/5 with weak grip strength with spasticity and shoulder and hand; RLE: 3+/5 greater in hip flexor and ankle dorsiflexion.  4/5 left upper and lower extremity Sensory.: intact to touch , pinprick , position and vibratory sensation.  Coordination: Decreased right hand finger dexterity. Gait and Station: Ambulating with rolling walker, stance is hunched, ambulation with short steps, heel, toe and tandem walk not tested Reflexes: 2+ and asymmetric and brisker on the right Toes downgoing.     ASSESSMENT: 27 year pleasant Caucasian lady with the by several tiny embolic infarcts of cryptogenic etiology in November 2018 with vascular risk factors of hyperlipidemia, prior stroke in 2017 and advanced age. TEE and prolonged cardiac monitoring were not done given high fall risk status.  Patient is being seen today for six-month follow-up visit with continued right hemiparesis and possible deconditioning after completion of therapies.    PLAN: -Continue clopidogrel 75 mg daily  and lipitor  for secondary stroke prevention -F/u with PCP regarding your HLD and HTN management -Continue to follow with Dr. Allena Katz for Botox injections on 04/14/2018 -has been questioning whether they should continue to  receive Botox treatments -advised him to follow-up with Dr. Allena Katz as scheduled and speak to him regarding concerns with prior Botox injections -New referral placed for PT/OT due to generalized deconditioning along with continued right hemiparesis and difficulty using rolling walker.  Long discussion with husband, daughter and patient regarding uncertainty of possible further improvement of right hemiparesis due to stroke as stroke occurred 1 year ago.   -continue to monitor BP at home -Advised patient to increase activity level as tolerated to help prevent further deconditioning -Maintain strict control of hypertension with blood pressure goal below 130/90, diabetes with hemoglobin A1c goal below 6.5% and cholesterol with LDL cholesterol (bad  cholesterol) goal below 70 mg/dL. I also advised the patient to eat a healthy diet with plenty of whole grains, cereals, fruits and vegetables, exercise regularly and maintain ideal body weight.  Follow up as needed as patient stable from stroke standpoint.  Advised husband and patient to call with questions, concerns or need of future follow-up appointment  Greater than 50% of time during this 25 minute visit was spent on counseling,explanation of diagnosis of cryptogenic stroke, reviewing risk factor management of HLD and HTN, planning of further management, discussion with patient and family and coordination of care  [name] Hugh, [facility]  [facility] [address] [place], [place] [address]  Phone [phone] Fax [phone]

## 2018-04-09 NOTE — Progress Notes (Signed)
I agree with the above plan 

## 2018-04-10 IMAGING — DX DG LUMBAR SPINE 2-3V
3 series · 3 of 3 positions shown · non-contrast
Comparison: None.

CLINICAL DATA: Low back pain secondary to a fall.

EXAM:
LUMBAR SPINE - 2-3 VIEW

[l-spine ap]
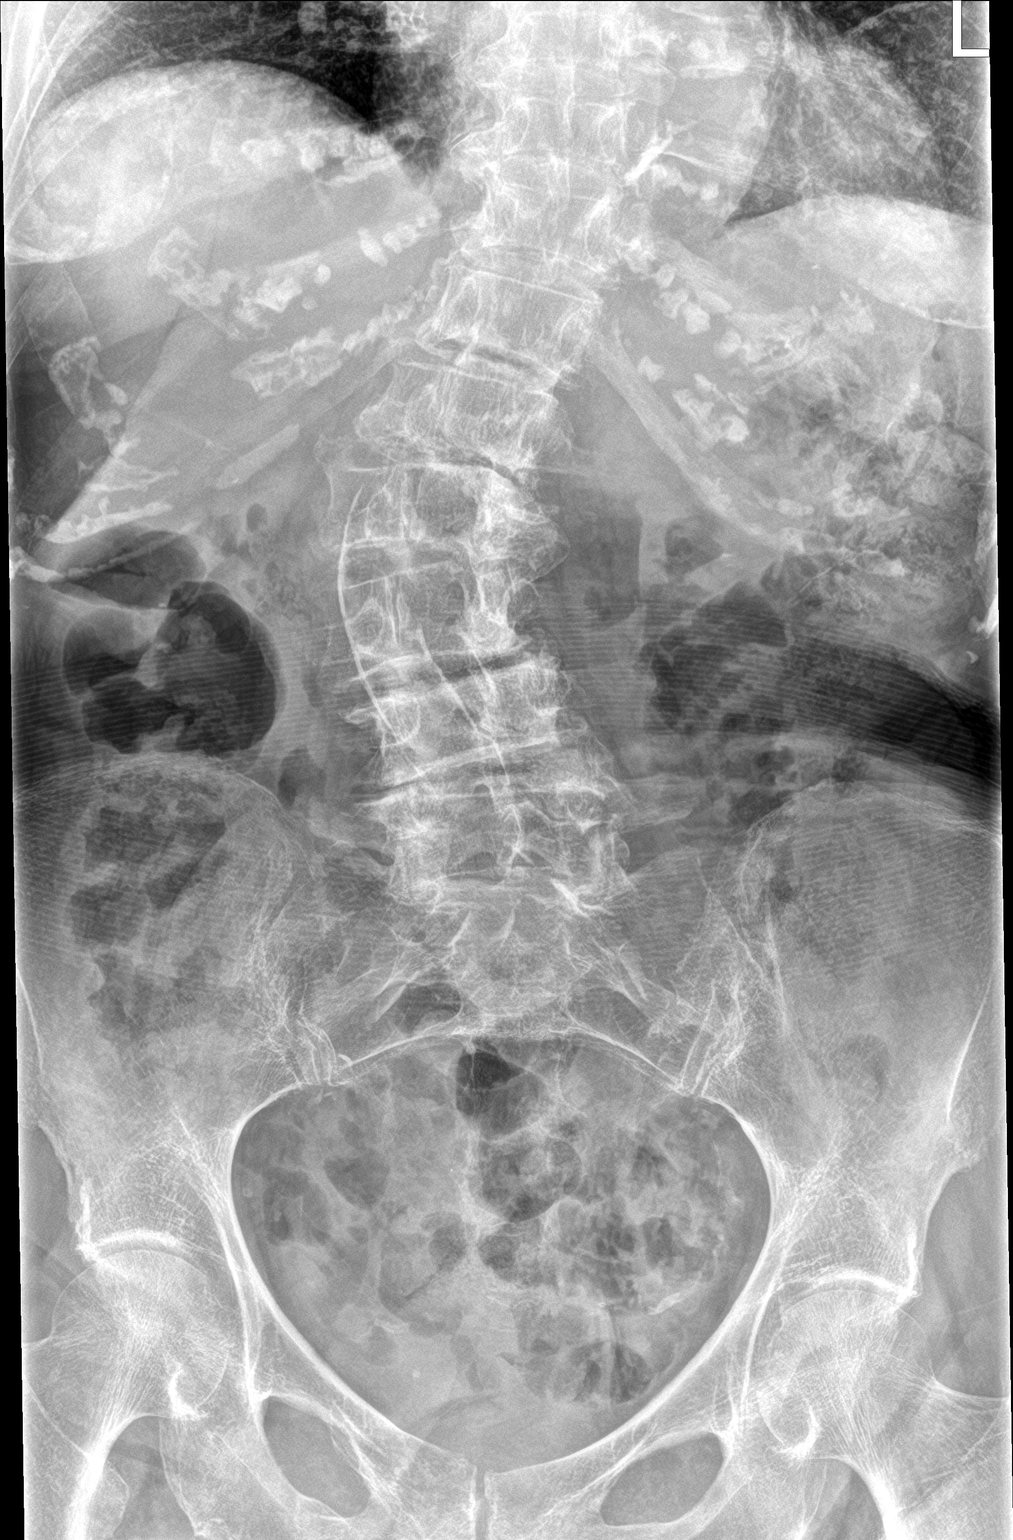

[l-spine lat]
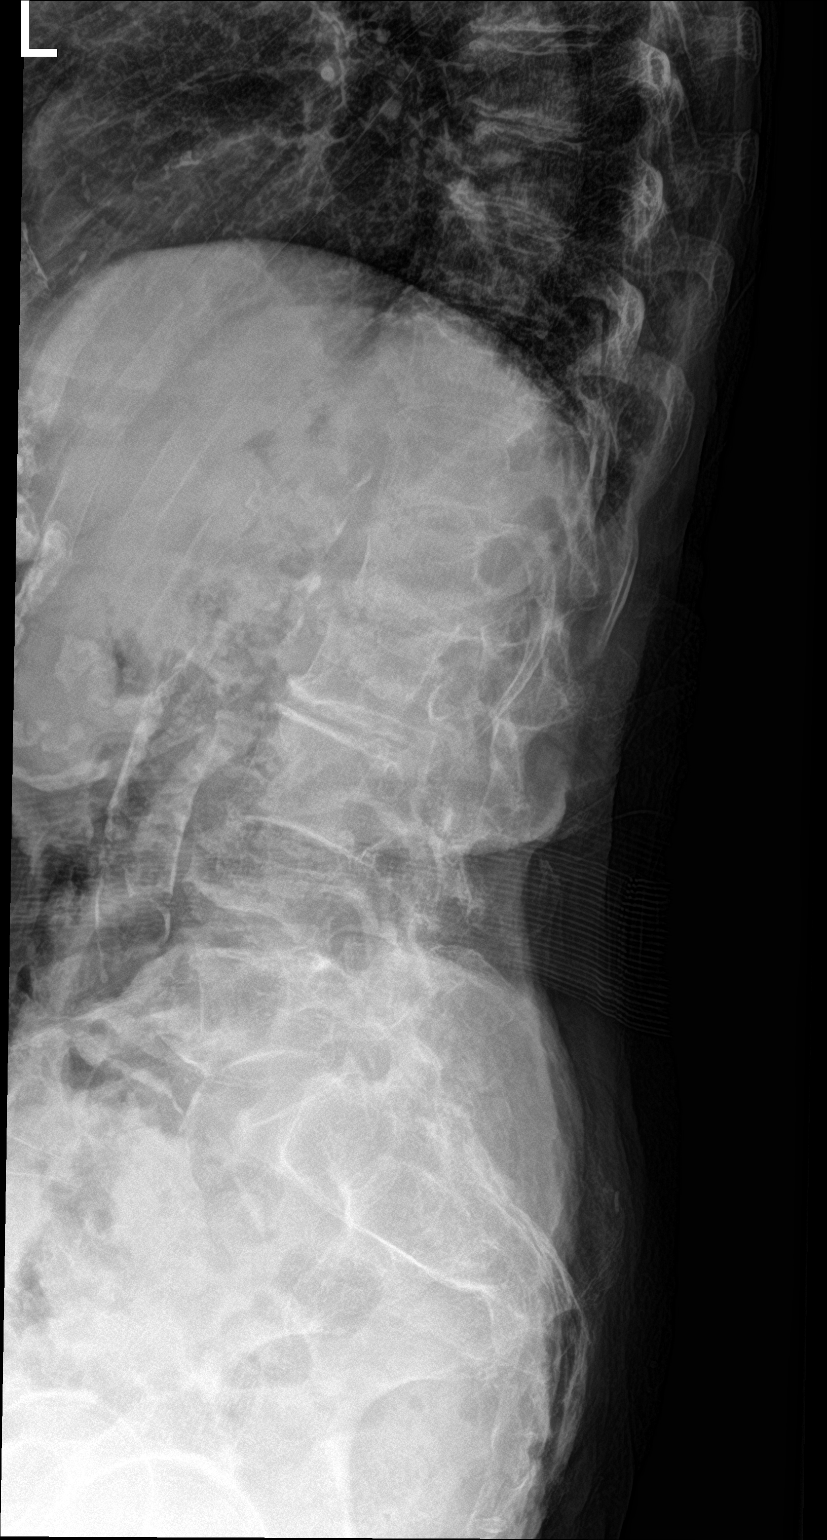

[l-spine spot]
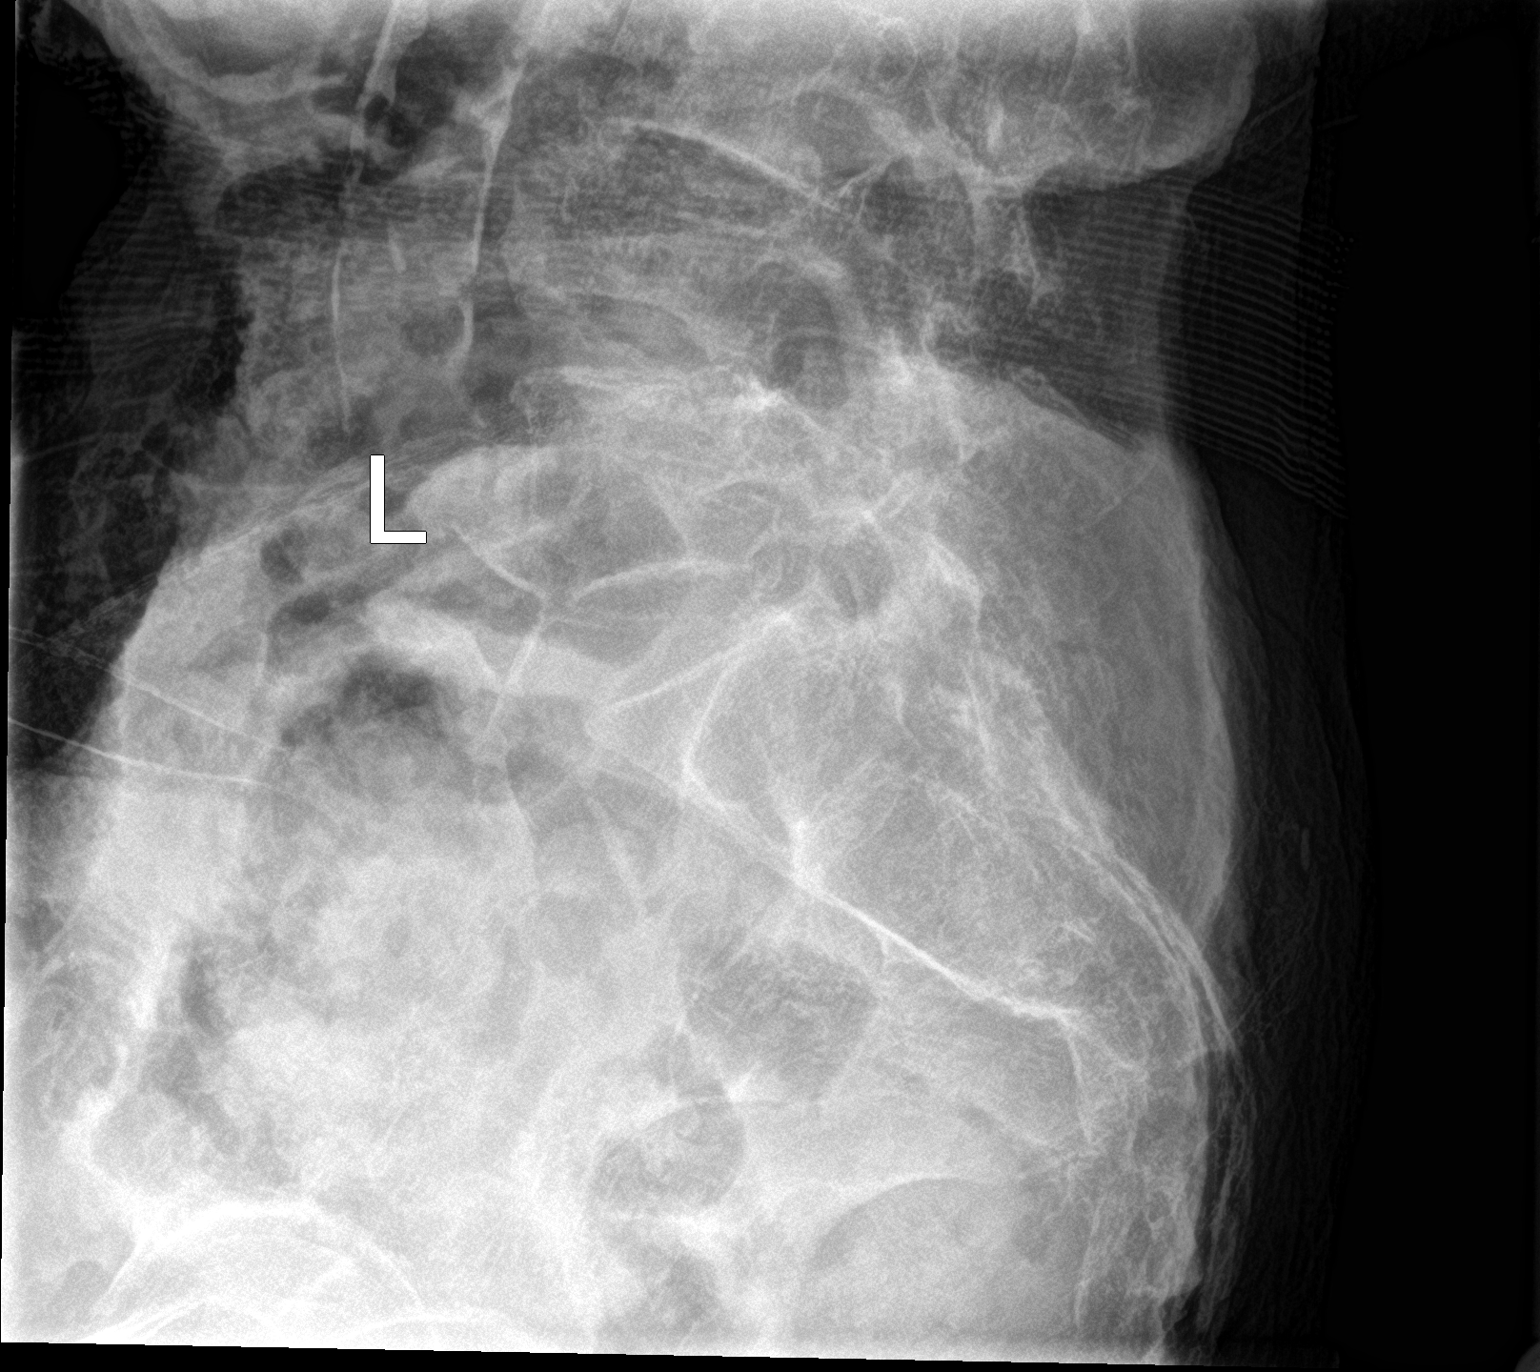

[3 of 3 positions shown; findings below may reference images not displayed]

FINDINGS: There is a lumbar scoliosis with convexity to the right centered at
L2-3. There is a compression deformity of the L4 vertebral body, age
indeterminate. There is degenerative disc disease from T12-L1
through L4-5. There is sparing of the L5-S1 disc space.

No bone destruction. There is moderate degenerative facet arthritis
at L4-5 and L5-S1.

Incidental note is made of aortic atherosclerosis.
IMPRESSION: 1. Compression deformity of the L4 vertebra, age indeterminate.
2. Diffuse degenerative disc disease with sparing at L5-S1. Lumbar
scoliosis.
3. Aortic atherosclerosis.

## 2018-04-14 ENCOUNTER — Encounter: Payer: Medicare Other | Attending: Physical Medicine & Rehabilitation | Admitting: Physical Medicine & Rehabilitation

## 2018-04-14 ENCOUNTER — Encounter: Payer: Self-pay | Admitting: Physical Medicine & Rehabilitation

## 2018-04-14 VITALS — BP 118/72 | HR 87 | Resp 14 | Ht 60.0 in | Wt 95.6 lb

## 2018-04-14 DIAGNOSIS — G8111 Spastic hemiplegia affecting right dominant side: Secondary | ICD-10-CM | POA: Diagnosis not present

## 2018-04-14 DIAGNOSIS — S32040S Wedge compression fracture of fourth lumbar vertebra, sequela: Secondary | ICD-10-CM

## 2018-04-14 DIAGNOSIS — R269 Unspecified abnormalities of gait and mobility: Secondary | ICD-10-CM | POA: Diagnosis not present

## 2018-04-14 DIAGNOSIS — G250 Essential tremor: Secondary | ICD-10-CM | POA: Diagnosis not present

## 2018-04-14 DIAGNOSIS — I6389 Other cerebral infarction: Secondary | ICD-10-CM | POA: Diagnosis not present

## 2018-04-14 NOTE — Progress Notes (Signed)
Subjective:    Patient ID: Kelly HumphreysBennie H Galen, female    DOB: [birth year], 84 y.o.   MRN: [id number]  HPI 84 year old right-handed female with history of essential tremor presents for follow up for bilateral infarcts.  Last clinic visit 01/13/18.  She had Botox injection at that time. Husband present, who provides majority of history. Since that time, she states she now has to use the rolling walker.  She is having trouble with grip. Denies falls.   Pain Inventory Average Pain 0 Pain Right Now 0 My pain is  no pain  In the last 24 hours, has pain interfered with the following? General activity 0 Relation with others 0 Enjoyment of life 0 What TIME of day is your pain at its worst? no pain Sleep (in general) Good  Pain is worse with: no pain Pain improves with: no pain Relief from Meds: no meds  Mobility walk with assistance use a walker how many minutes can you walk? 5 ability to climb steps?  no do you drive?  no  Function retired I need assistance with the following:  dressing, bathing and toileting  Neuro/Psych bladder control problems trouble walking  Prior Studies Any changes since last visit?  no  Physicians involved in your care Any changes since last visit?  no   Family History  Problem Relation Age of Onset  . Arthritis Brother        spine  . Osteoporosis Maternal Grandmother    Social History   Socioeconomic History  . Marital status: Married    Spouse name: Greggory StallionGeorge  . Number of children: 3  . Years of education: college  . Highest education level: Not on file  Occupational History  . Occupation: real estate    Comment: retired  Engineer, productionocial Needs  . Financial resource strain: Not very hard  . Food insecurity:    Worry: Patient refused    Inability: Patient refused  . Transportation needs:    Medical: Patient refused    Non-medical: Patient refused  Tobacco Use  . Smoking status: Never Smoker  . Smokeless tobacco: Never Used  Substance and  Sexual Activity  . Alcohol use: Yes    Comment: Wine 2 glass daily occasionally  . Drug use: No  . Sexual activity: Not Currently    Partners: Male    Birth control/protection: Post-menopausal  Lifestyle  . Physical activity:    Days per week: Not on file    Minutes per session: Not on file  . Stress: Not on file  Relationships  . Social connections:    Talks on phone: Not on file    Gets together: Not on file    Attends religious service: Not on file    Active member of club or organization: Not on file    Attends meetings of clubs or organizations: Not on file    Relationship status: Not on file  Other Topics Concern  . Not on file  Social History Narrative   Lives at home with her husband.   Right-handed.   No caffeine use.   Past Surgical History:  Procedure Laterality Date  . APPENDECTOMY  1940  . bladder tack    . CATARACT EXTRACTION Bilateral   . HEMORRHOID SURGERY  1960  . HUMERUS FRACTURE SURGERY    . LAPAROSCOPIC HYSTERECTOMY  2006  . torn rotator cuff    . WRIST FRACTURE SURGERY  2005   seconday shoulder 2001   Past Medical History:  Diagnosis Date  .  Acute blood loss anemia   . Adjustment disorder with depressed mood   . CVA (cerebral vascular accident) (HCC) 02/03/2016   Linear infarct within the posterior limb of left internal capsule  . Diastolic CHF (HCC)   . Dysphagia   . Dysphonia 02/20/2013  . Essential and other specified forms of tremor 02/20/2013  . HLD (hyperlipidemia)   . HTN (hypertension)   . OAB (overactive bladder)   . Osteoporosis   . Patient receiving subcutaneous heparin    For DVT prophylaxis 9/17  . Prediabetes   . Psoriasis   . Slow transit constipation   . Stroke (cerebrum) (HCC) 02/01/2016  . Trigeminal neuralgia 02/20/2013   BP 118/72 (BP Location: Left Arm, Patient Position: Sitting, Cuff Size: Normal)   Pulse 87   Resp 14   Ht 5' (1.524 m)   Wt 95 lb 9.6 oz (43.4 kg)   SpO2 95%   BMI 18.67 kg/m   Opioid Risk  Score:   Fall Risk Score:  `1  Depression screen PHQ 2/9  Depression screen [facility] 2/9 01/13/2018 10/13/2017 12/10/2016 04/24/2016  Decreased Interest 0 0 0 0  Down, Depressed, Hopeless 0 - 0 0  PHQ - 2 Score 0 0 0 0  Altered sleeping - - - 0  Tired, decreased energy - - - 0  Change in appetite - - - 0  Feeling bad or failure about yourself  - - - 0  Trouble concentrating - - - 0  Moving slowly or fidgety/restless - - - 0  Suicidal thoughts - - - 0  PHQ-9 Score - - - 0    Review of Systems  Constitutional: Negative.   HENT: Negative.   Eyes: Negative.   Respiratory: Negative.   Cardiovascular: Negative.   Gastrointestinal: Positive for constipation.       Incontinence  Endocrine: Negative.   Genitourinary: Positive for difficulty urinating.  Musculoskeletal: Positive for arthralgias and gait problem.  Skin: Negative.   Allergic/Immunologic: Negative.   Neurological: Positive for weakness.  Hematological: Bruises/bleeds easily.  Psychiatric/Behavioral: Negative.   All other systems reviewed and are negative.     Objective:   Physical Exam Constitutional: She appears well-developed. Frail. NAD.  HENT: Normocephalic and atraumatic.  Eyes: EOMI. No discharge..  Cardiovascular: RRR. No JVD. Respiratory: Effort normal and breath sounds normal.  GI: Soft. Bowel sounds are normal.  Musculoskeletal: She exhibits no edema or tenderness.  Gait: Slow cadence with kyphotic posture Neurological: She is alert and oriented.  HOH Mild dysarthria.  Right facial droop +LUE Resting tremor Motor: RUE: shoulder abduction, elbow flexion/extension 4/5, wrist 4/5, hand 4/5 RLE: 4+/5 hip flexion, knee extension, 2+/5 ankle dorsi/plantar flexion MAS: right elbow flexors 1/4, wrist flexors 1/4, finger flexors 1/4.  Psychiatric: She has a normal mood and affect. Her behavior is normal Skin: Skin is warm and dry    Assessment & Plan:  84 year old right-handed female with history of essential  tremor presents for follow for bilateral infarcts.  1. Late effects bilateral infarct with spastic hemiplegia affecting right dominant side (G81.11).  Will hold off on Botox injection at this time (pt would like to see if where tone is inhibiting movement):   Right Biceps 50U   Right FCR 50U   Right FDS 80U   Right FDP 20U  Cont HEP  2. Gait abnormality  Cont HEP  Cont walker for safety  3. Lumbar compression fracture  Improved  Cont conservative care

## 2018-05-11 ENCOUNTER — Ambulatory Visit: Payer: Medicare Other | Admitting: Occupational Therapy

## 2018-05-11 ENCOUNTER — Encounter: Payer: Self-pay | Admitting: Physical Therapy

## 2018-05-11 ENCOUNTER — Ambulatory Visit: Payer: Medicare Other | Attending: Adult Health | Admitting: Physical Therapy

## 2018-05-11 ENCOUNTER — Other Ambulatory Visit: Payer: Self-pay

## 2018-05-11 DIAGNOSIS — R278 Other lack of coordination: Secondary | ICD-10-CM | POA: Insufficient documentation

## 2018-05-11 DIAGNOSIS — I69315 Cognitive social or emotional deficit following cerebral infarction: Secondary | ICD-10-CM

## 2018-05-11 DIAGNOSIS — M25641 Stiffness of right hand, not elsewhere classified: Secondary | ICD-10-CM | POA: Insufficient documentation

## 2018-05-11 DIAGNOSIS — M25611 Stiffness of right shoulder, not elsewhere classified: Secondary | ICD-10-CM

## 2018-05-11 DIAGNOSIS — R293 Abnormal posture: Secondary | ICD-10-CM | POA: Diagnosis not present

## 2018-05-11 DIAGNOSIS — I69351 Hemiplegia and hemiparesis following cerebral infarction affecting right dominant side: Secondary | ICD-10-CM

## 2018-05-11 DIAGNOSIS — R2689 Other abnormalities of gait and mobility: Secondary | ICD-10-CM | POA: Insufficient documentation

## 2018-05-11 DIAGNOSIS — M6281 Muscle weakness (generalized): Secondary | ICD-10-CM

## 2018-05-11 DIAGNOSIS — R2681 Unsteadiness on feet: Secondary | ICD-10-CM | POA: Insufficient documentation

## 2018-05-11 NOTE — Therapy (Signed)
[facility] [address] [place], [place], [address] Phone: [phone]   Fax:  [phone]  Physical Therapy Evaluation  Patient Details  Name: Kelly [name] MRN: [id number] Date of Birth: 01-11-1928 Referring Provider (PT): [name] Hugh, NP   Encounter Date: 05/11/2018  PT End of Session - 05/11/18 1610    Visit Number  1    Number of Visits  17    Date for PT Re-Evaluation  07/20/18    Authorization Type  Medicare and [facility]    PT Start Time  1450    PT Stop Time  1530    PT Time Calculation (min)  40 min    Equipment Utilized During Treatment  --   Min A to Min guard throughout   Activity Tolerance  Patient tolerated treatment well    Behavior During Therapy  [facility] for tasks assessed/performed       Past Medical History:  Diagnosis Date  . Acute blood loss anemia   . Adjustment disorder with depressed mood   . CVA (cerebral vascular accident) (HCC) 02/03/2016   Linear infarct within the posterior limb of left internal capsule  . Diastolic CHF (HCC)   . Dysphagia   . Dysphonia 02/20/2013  . Essential and other specified forms of tremor 02/20/2013  . HLD (hyperlipidemia)   . HTN (hypertension)   . OAB (overactive bladder)   . Osteoporosis   . Patient receiving subcutaneous heparin    For DVT prophylaxis 9/17  . Prediabetes   . Psoriasis   . Slow transit constipation   . Stroke (cerebrum) (HCC) 02/01/2016  . Trigeminal neuralgia 02/20/2013    Past Surgical History:  Procedure Laterality Date  . APPENDECTOMY  1940  . bladder tack    . CATARACT EXTRACTION Bilateral   . HEMORRHOID SURGERY  1960  . HUMERUS FRACTURE SURGERY    . LAPAROSCOPIC HYSTERECTOMY  2006  . torn rotator cuff    . WRIST FRACTURE SURGERY  2005   seconday shoulder 2001    There were no vitals filed for this visit.   Subjective Assessment - 05/11/18 1454    Subjective  patient feels like once she put handle on RW she  stopped walking. Feels like she does not have control of R UE/hand - has had Botox injections. Has been walking 10 min in morning and evening. feels as if she has gotten some weaker since last PT episode. Doing some HEP from last episode - has really only focused on walking due to concern of bowel movements -  feels like she is on a regular schedule with BM.     Patient is accompained by:  Family member    Pertinent History  CVA, HTN, osteoporosis, HLD    Patient Stated Goals  find a way to walk better with RW.     Currently in Pain?  No/denies    Multiple Pain Sites  No         [facility] PT Assessment - 05/11/18 1504      Assessment   Medical Diagnosis  Right spastic hemiparesis    Referring Provider (PT)  [name] Hugh, NP    Next MD Visit  --   prn   Prior Therapy  yes      Precautions   Precautions  Fall      Restrictions   Weight Bearing Restrictions  No      Balance Screen   Has the patient fallen in the past  6 months  No    Has the patient had a decrease in activity level because of a fear of falling?   Yes    Is the patient reluctant to leave their home because of a fear of falling?   No      Home Environment   Living Environment  Private residence    Living Arrangements  Spouse/significant other    Available Help at Discharge  Family;Available 24 hours/day    Type of Home  House    Home Access  Stairs to enter    Entrance Stairs-Number of Steps  2    Entrance Stairs-Rails  Right    Home Layout  Two level;Able to live on main level with bedroom/bathroom    Alternate Level Stairs-Number of Steps  12    Home Equipment  Walker - 2 wheels;Shower seat;Grab bars - tub/shower;Grab bars - toilet;Wheelchair - manual    Additional Comments  has aide 3x/week to assist with bathing      Prior Function   Level of Independence  Needs assistance with ADLs;Independent with household mobility with device      Cognition   Overall Cognitive Status  Within Functional Limits for  tasks assessed      Coordination   Gross Motor Movements are Fluid and Coordinated  No    Fine Motor Movements are Fluid and Coordinated  No      Posture/Postural Control   Posture/Postural Control  Postural limitations    Postural Limitations  Rounded Shoulders;Forward head;Posterior pelvic tilt;Increased thoracic kyphosis;Flexed trunk      ROM / Strength   AROM / PROM / Strength  Strength      Strength   Overall Strength Comments  gross R LE strength 3/5; gross strength of L LE 4/5      Transfers   Transfers  Sit to Stand;Stand to Sit    Sit to Stand  4: Min assist;4: Min guard;With upper extremity assist;With armrests;From chair/3-in-1    Stand to Sit  4: Min assist;4: Min guard;With upper extremity assist;With armrests;To chair/3-in-1      Ambulation/Gait   Ambulation/Gait  Yes    Ambulation/Gait Assistance  4: Min guard    Ambulation Distance (Feet)  313 Feet    Assistive device  Rolling walker    Gait Pattern  Step-to pattern;Step-through pattern;Decreased stride length;Decreased hip/knee flexion - right;Decreased weight shift to right;Trunk flexed    Ambulation Surface  Level;Indoor    Gait velocity  1.23 ft/sec      6 Minute Walk- Baseline   6 Minute Walk- Baseline  yes    HR (bpm)  85    02 Sat (%RA)  96 %      6 Minute walk- Post Test   6 Minute Walk Post Test  yes    HR (bpm)  98    02 Sat (%RA)  96 %      6 minute walk test results    Aerobic Endurance Distance Walked  313    Endurance additional comments  with use of RW      Balance   Balance Assessed  Yes      Standardized Balance Assessment   Standardized Balance Assessment  Five Times Sit to Stand    Five times sit to stand comments   35.65 - use of armrests                Objective measurements completed on examination: See above findings.  PT Education - 05/11/18 1610    Education Details  exam findings, POC, goal establishment    Person(s) Educated   Patient;Spouse    Methods  Explanation    Comprehension  Verbalized understanding       PT Short Term Goals - 05/11/18 1617      PT SHORT TERM GOAL #1   Title  patient to be independent with initial HEP for balance, strength, mobility    Time  4    Period  Weeks    Status  New    Target Date  06/15/18      PT SHORT TERM GOAL #2   Title  patient to demonstrate 5x sit to stand without assist from PT/family with use of armrests in </= 30 seconds    Baseline  35.65 seconds with intermittent Min A from PT    Time  4    Period  Weeks    Status  New    Target Date  06/15/18      PT SHORT TERM GOAL #3   Title  patient to improve gait velocity by >/= 0.4 ft/sec    Baseline  1.23 ft/sec    Time  4    Period  Weeks    Status  New    Target Date  06/15/18      PT SHORT TERM GOAL #4   Title  patient to report ability to increase daily walking program to 2x 15 minutes     Baseline  2 x 10 min    Time  4    Period  Weeks    Status  New    Target Date  06/15/18        PT Long Term Goals - 05/11/18 1619      PT LONG TERM GOAL #1   Title  patient to be independent with advanced HEP    Time  8    Period  Weeks    Status  New    Target Date  07/20/18      PT LONG TERM GOAL #2   Title  patient to improve 6 min walk test to >/= 425 ft with LRAD    Baseline  313 ft with RW    Time  8    Period  Weeks    Status  New    Target Date  07/20/18      PT LONG TERM GOAL #3   Title  patient to demonstrate improved gait velocity by >/= 0.8 ft/sec from baseline    Baseline  1.23 ft/sec    Time  8    Period  Weeks    Status  New    Target Date  07/20/18      PT LONG TERM GOAL #4   Title  patient to demonstrate gait over indoor/outdoor surfaces for >/= 500 feet with LRAD at SUP for safe community ambulation    Time  8    Period  Weeks    Status  New    Target Date  07/20/18             Plan - 05/11/18 1611    Clinical Impression Statement  Mrs. Coco is a very pleasant  84 y/o female presenting to OPPT today regarding primary complaints of difficulty walking and increased weakness following last episode of care. Patient today demonstrating reduced gait speed and walking distance as compared to measures from last PT session 10/2017. Patient also requiring intermittent Min A  for sit to stand due to fatigue and instability. Patient with goals to improve mobility, strength, and overall endurance. Patient to benefit from skilled PT intervention to address the above listed deficits to improve functional mobility and safety for reduced fall risk.     History and Personal Factors relevant to plan of care:  CVA, HTN, osteoporosis, HLD    Clinical Presentation  Evolving    Clinical Presentation due to:  CVA, HTN, osteoporosis, HLD, reduced mobility, use of AD (RW vs w/c), requires aide for ADLs    Clinical Decision Making  Moderate    Rehab Potential  Good    PT Frequency  2x / week    PT Duration  8 weeks    PT Treatment/Interventions  ADLs/Self Care Home Management;Functional mobility training;Stair training;Gait training;DME Instruction;Therapeutic activities;Therapeutic exercise;Balance training;Neuromuscular re-education;Patient/family education;Passive range of motion;Manual techniques;Taping    Consulted and Agree with Plan of Care  Patient       Patient will benefit from skilled therapeutic intervention in order to improve the following deficits and impairments:  Abnormal gait, Decreased activity tolerance, Decreased balance, Decreased mobility, Decreased strength, Decreased endurance, Decreased safety awareness, Difficulty walking  Visit Diagnosis: Unsteadiness on feet  Other abnormalities of gait and mobility  Muscle weakness (generalized)  Abnormal posture     Problem List Patient Active Problem List   Diagnosis Date Noted  . Right spastic hemiplegia (HCC) 10/13/2017  . Acute lower UTI   . Labile blood pressure   . Abnormal urinalysis   . Trauma    . Closed compression fracture of L4 lumbar vertebra   . Neurogenic bladder   . History of hypertension   . Anemia of chronic disease   . Hypoalbuminemia due to protein-calorie malnutrition (HCC)   . Acute bilateral cerebral infarction in a watershed distribution [facility]) 04/05/2017  . Benign essential HTN   . Chronic diastolic heart failure (HCC)   . Right spastic hemiparesis (HCC)   . History of stroke 02/16/2017  . Abnormality of gait following cerebrovascular accident (CVA) 02/16/2017  . Slow transit constipation   . Acute blood loss anemia   . Adjustment disorder with depressed mood   . Cerebrovascular accident (CVA) (HCC) 02/03/2016  . Dysarthria, post-stroke   . Dysphagia, post-stroke   . Leukocytosis   . Prediabetes   . Right hemiparesis (HCC)   . Cerebral infarction due to unspecified mechanism   . Other secondary hypertension   . Overactive bladder   . Diastolic dysfunction   . Hyperlipidemia   . Essential hypertension   . Essential tremor   . Stroke (cerebrum) (HCC) 02/01/2016  . Facial droop due to stroke 02/01/2016  . Essential and other specified forms of tremor 02/20/2013  . Dysphonia 02/20/2013  . Trigeminal neuralgia 02/20/2013     Kipp Laurence, PT, DPT Supplemental Physical Therapist 05/11/18 4:23 PM Pager: [phone] Office: [phone]  [facility] [address] [place], [place], [address] Phone: [phone]   Fax:  [phone]  Name: Kelly [name] MRN: [id number] Date of Birth: April 15, 1928

## 2018-05-12 ENCOUNTER — Ambulatory Visit: Payer: Medicare Other | Admitting: Physical Medicine & Rehabilitation

## 2018-05-12 NOTE — Therapy (Signed)
[facility] [address] [place], [place], [address] Phone: [phone]   Fax:  [phone]  Occupational Therapy Evaluation  Patient Details  Name: Kelly [name] MRN: [id number] Date of Birth: 1928-03-16 Referring Provider (OT): Elder Love   Encounter Date: 05/11/2018  OT End of Session - 05/11/18 1635    Visit Number  1    Number of Visits  25    Date for OT Re-Evaluation  08/09/18    Authorization Type  Medicare    Authorization Time Period  90 days, anticipate d/c after 8 weeks    Authorization - Visit Number  1    Authorization - Number of Visits  10    OT Start Time  1540    OT Stop Time  1615    OT Time Calculation (min)  35 min    Activity Tolerance  Patient tolerated treatment well    Behavior During Therapy  [facility] for tasks assessed/performed       Past Medical History:  Diagnosis Date  . Acute blood loss anemia   . Adjustment disorder with depressed mood   . CVA (cerebral vascular accident) (HCC) 02/03/2016   Linear infarct within the posterior limb of left internal capsule  . Diastolic CHF (HCC)   . Dysphagia   . Dysphonia 02/20/2013  . Essential and other specified forms of tremor 02/20/2013  . HLD (hyperlipidemia)   . HTN (hypertension)   . OAB (overactive bladder)   . Osteoporosis   . Patient receiving subcutaneous heparin    For DVT prophylaxis 9/17  . Prediabetes   . Psoriasis   . Slow transit constipation   . Stroke (cerebrum) (HCC) 02/01/2016  . Trigeminal neuralgia 02/20/2013    Past Surgical History:  Procedure Laterality Date  . APPENDECTOMY  1940  . bladder tack    . CATARACT EXTRACTION Bilateral   . HEMORRHOID SURGERY  1960  . HUMERUS FRACTURE SURGERY    . LAPAROSCOPIC HYSTERECTOMY  2006  . torn rotator cuff    . WRIST FRACTURE SURGERY  2005   seconday shoulder 2001    There were no vitals filed for this visit.  Subjective Assessment - 05/11/18 1631    Subjective   Pt reports a recent decline in mobility  and RUE functional use which has impacted her ADLs    Patient Stated Goals  to stay more mobile, use right hand more    Currently in Pain?  No/denies        [facility] OT Assessment - 05/12/18 0001      Assessment   Medical Diagnosis  Right spastic hemiparesis   hx of botox to RUE   Referring Provider (OT)  Shanda Bumps V[place]ack    Onset Date/Surgical Date  04/06/18    Prior Therapy  yes      Precautions   Precautions  Fall      Restrictions   Weight Bearing Restrictions  No      Home  Environment   Family/patient expects to be discharged to:  Private residence    Lives With  Spouse      Prior Function   Level of Independence  Needs assistance with ADLs;Independent with household mobility with device      ADL   Eating/Feeding  Needs assist with cutting food   eats with left hand   Grooming  Set up   has assit with styling hair 1x week   Upper Body Bathing  --   aide  provides mod-max A   Lower Body Bathing  Maximal assistance    Upper Body Dressing  Set up    Lower Body Dressing  Moderate assistance;Simulated;Independent    Midwife guard   with walker   Tub/Shower Transfer  Minimal assistance    ADL comments  has an aide 3 mornings a week   Pt reports she is not using RUE as an assist currently     IADL   Light Housekeeping  --   Pt is not consitently using RUE to assist     Mobility   Mobility Status  Needs assist   Pt reports she has not been walking consitently at home   Mobility Status Comments  Pt reports decreased ambulation due to difficulty grasping walker previously with RUE after botox   Pt reports she ambulates only when someone is with her     Written Expression   Dominant Hand  --   uses left hand as dominant hand now     Vision - History   Baseline Vision  Wears glasses all the time      Cognition   Memory  Impaired    Memory Impairment  Decreased short term memory       Posture/Postural Control   Posture/Postural Control  Postural limitations    Postural Limitations  Rounded Shoulders;Forward head;Posterior pelvic tilt;Increased thoracic kyphosis;Flexed trunk      Coordination   Gross Motor Movements are Fluid and Coordinated  No    Coordination  impaired for RUE      Tone   Assessment Location  Right Upper Extremity      ROM / Strength   AROM / PROM / Strength  AROM      AROM   Overall AROM   Deficits    Overall AROM Comments  RUE shoulder flexion 55, abduction 55, elbow extension -30, elbow flexion 105, supination 25%, pronation 90%, wrist extension 25%, finger flexion 65%, extension 40%      RUE Tone   RUE Tone  Hypertonic   mild-moderate spasticity in RUE                       OT Short Term Goals - 05/12/18 1411      OT SHORT TERM GOAL #1   Title  Patient will complete a home exercise program designed to improve range of motion in right arm- with min prompting.      Time  6    Period  Weeks    Status  New    Target Date  06/26/18      OT SHORT TERM GOAL #2   Title  Pt will use RUE as a gross A/ stabilizer 10% of the time for ADLS.    Time  8    Period  Weeks    Status  New      OT SHORT TERM GOAL #3   Title  Pt will demonstrate ability to retrieve a lightweight object at 60* shoulder flexion.    Baseline  55*    Time  6    Period  Weeks    Status  New      OT SHORT TERM GOAL #4   Title  Pt will demonstrate improved endurance and functional mobility as evidenced by ambulating to bathroom with supervision 2x per day.    Baseline  Pt is often using w/c to roll into to bathroom and transfer and she has been walking  much less    Time  6    Period  Weeks    Status  New        OT Long Term Goals - 05/12/18 1412      OT LONG TERM GOAL #1   Title  Pt will perform updated HEP for RUE functional use with min prompts.    Time  12    Period  Weeks    Status  New    Target Date  08/10/18      OT LONG TERM GOAL  #2   Title  Pt will report ability to use LUE as a gross assist/ stabilizer at least 25 % of the time for ADLs.    Time  12    Period  Weeks    Status  New      OT LONG TERM GOAL #3   Title  Pt will demonstrate adequate grasp and RUE functional use to place/ remove several lightweight objects in the dishwasher with RUE with supervision    Time  12    Period  Weeks    Status  New      OT LONG TERM GOAL #4   Title  Pt will demonstrate ability to wipe down kitchen counter in standing using RUE with supervision.    Time  12    Period  Weeks    Status  New            Plan - 05/11/18 1648    Clinical Impression Statement  Pt is an 84 y.o female whith hx of CVA who has had a recent decline in ADLs and functional mobility. Pt reports she was having difficulty grasping her walker after previous botox and she stopped walking as much as a result. Pt reports that her decreased mobility made her constipated. Pt returns to occupational therapy with the following deficits: R hemiplegia, decreased strength, spasticity, abnormal posture, decreased functional mobility, decreaed ROM, decreased strength, decreased coordination, and decreased balance which impedes performance of pt's daily activities. Pt can benefit from skilled occupational therapy to maximize pt's safety and independence with ADLs/IADLs.    Occupational Profile and client history currently impacting functional performance  CVA, HTN, osteoporosis, HLD  Pt reports a decline in her walking  and right UE functional use which impdes ADLs/IADLS. Pt reports she is scheduled for upcoming botox and needs information about how to proceed.    Occupational performance deficits (Please refer to evaluation for details):  ADL's;IADL's;Leisure    Rehab Potential  Fair    Current Impairments/barriers affecting progress:  severity of deficits, length of time since inital onset, spasticity    OT Frequency  2x / week   plus eval, anticipate d/c after 8-10  weeks    OT Duration  12 weeks    OT Treatment/Interventions  Self-care/ADL training;Therapeutic exercise;Gait Training;Aquatic Therapy;Moist Heat;Paraffin;Neuromuscular education;Stair Training;Splinting;Patient/family education;Balance training;Therapeutic activities;Functional Mobility Training;Fluidtherapy;Traction;Cryotherapy;Ultrasound;Contrast Bath;DME and/or AE instruction;Manual Therapy;Passive range of motion;Cognitive remediation/compensation    Plan  inital HEP, consider night splint for RUE    Clinical Decision Making  Several treatment options, min-mod task modification necessary    Consulted and Agree with Plan of Care  Patient       Patient will benefit from skilled therapeutic intervention in order to improve the following deficits and impairments:  Abnormal gait, Decreased cognition, Decreased knowledge of use of DME, Impaired flexibility, Decreased coordination, Decreased mobility, Decreased activity tolerance, Decreased endurance, Decreased range of motion, Decreased strength, Impaired tone, Impaired UE functional use,  Impaired perceived functional ability, Difficulty walking, Decreased safety awareness, Decreased knowledge of precautions, Decreased balance  Visit Diagnosis: Muscle weakness (generalized) - Plan: Ot plan of care cert/re-cert  Spastic hemiplegia of right dominant side as late effect of cerebral infarction [facility](HCC) - Plan: Ot plan of care cert/re-cert  Stiffness of right hand, not elsewhere classified - Plan: Ot plan of care cert/re-cert  Abnormal posture - Plan: Ot plan of care cert/re-cert  Stiffness of right shoulder, not elsewhere classified - Plan: Ot plan of care cert/re-cert  Other lack of coordination - Plan: Ot plan of care cert/re-cert  Cognitive social or emotional deficit following cerebral infarction - Plan: Ot plan of care cert/re-cert  Other abnormalities of gait and mobility - Plan: Ot plan of care cert/re-cert    Problem List Patient  Active Problem List   Diagnosis Date Noted  . Right spastic hemiplegia (HCC) 10/13/2017  . Acute lower UTI   . Labile blood pressure   . Abnormal urinalysis   . Trauma   . Closed compression fracture of L4 lumbar vertebra   . Neurogenic bladder   . History of hypertension   . Anemia of chronic disease   . Hypoalbuminemia due to protein-calorie malnutrition (HCC)   . Acute bilateral cerebral infarction in a watershed distribution [facility](HCC) 04/05/2017  . Benign essential HTN   . Chronic diastolic heart failure (HCC)   . Right spastic hemiparesis (HCC)   . History of stroke 02/16/2017  . Abnormality of gait following cerebrovascular accident (CVA) 02/16/2017  . Slow transit constipation   . Acute blood loss anemia   . Adjustment disorder with depressed mood   . Cerebrovascular accident (CVA) (HCC) 02/03/2016  . Dysarthria, post-stroke   . Dysphagia, post-stroke   . Leukocytosis   . Prediabetes   . Right hemiparesis (HCC)   . Cerebral infarction due to unspecified mechanism   . Other secondary hypertension   . Overactive bladder   . Diastolic dysfunction   . Hyperlipidemia   . Essential hypertension   . Essential tremor   . Stroke (cerebrum) (HCC) 02/01/2016  . Facial droop due to stroke 02/01/2016  . Essential and other specified forms of tremor 02/20/2013  . Dysphonia 02/20/2013  . Trigeminal neuralgia 02/20/2013    RINE,KATHRYN 05/12/2018, 5:05 PM Keene BreathKathryn Rine, OTR/L [phone] Phone: [phone] 5:05 PM 12/19/19[facility] [address] [place], [place], [address] Phone: [phone]   Fax:  [phone]  Name: Kelly HumphreysBennie H [name] MRN: [id number] Date of Birth: 03/08/1928

## 2018-05-20 ENCOUNTER — Ambulatory Visit: Payer: Medicare Other | Admitting: Occupational Therapy

## 2018-05-20 DIAGNOSIS — I69351 Hemiplegia and hemiparesis following cerebral infarction affecting right dominant side: Secondary | ICD-10-CM | POA: Diagnosis not present

## 2018-05-20 DIAGNOSIS — M6281 Muscle weakness (generalized): Secondary | ICD-10-CM | POA: Diagnosis not present

## 2018-05-20 DIAGNOSIS — R2681 Unsteadiness on feet: Secondary | ICD-10-CM | POA: Diagnosis not present

## 2018-05-20 DIAGNOSIS — R293 Abnormal posture: Secondary | ICD-10-CM | POA: Diagnosis not present

## 2018-05-20 DIAGNOSIS — M25641 Stiffness of right hand, not elsewhere classified: Secondary | ICD-10-CM

## 2018-05-20 DIAGNOSIS — R2689 Other abnormalities of gait and mobility: Secondary | ICD-10-CM | POA: Diagnosis not present

## 2018-05-20 NOTE — Therapy (Signed)
[facility] [address] [place], [place], [address] Phone: [phone]   Fax:  [phone]  Occupational Therapy Treatment  Patient Details  Name: Kelly [name] MRN: [id number] Date of Birth: [birth year] Referring Provider (OT): Elder Love   Encounter Date: 05/20/2018  OT End of Session - 05/20/18 1536    Visit Number  2    Number of Visits  25    Date for OT Re-Evaluation  08/09/18    Authorization Type  Medicare    Authorization Time Period  90 days, anticipate d/c after 8 weeks    Authorization - Visit Number  2    Authorization - Number of Visits  10    OT Start Time  1320    OT Stop Time  1400    OT Time Calculation (min)  40 min    Activity Tolerance  Patient tolerated treatment well    Behavior During Therapy  [facility] for tasks assessed/performed       Past Medical History:  Diagnosis Date  . Acute blood loss anemia   . Adjustment disorder with depressed mood   . CVA (cerebral vascular accident) (HCC) 02/03/2016   Linear infarct within the posterior limb of left internal capsule  . Diastolic CHF (HCC)   . Dysphagia   . Dysphonia 02/20/2013  . Essential and other specified forms of tremor 02/20/2013  . HLD (hyperlipidemia)   . HTN (hypertension)   . OAB (overactive bladder)   . Osteoporosis   . Patient receiving subcutaneous heparin    For DVT prophylaxis 9/17  . Prediabetes   . Psoriasis   . Slow transit constipation   . Stroke (cerebrum) (HCC) 02/01/2016  . Trigeminal neuralgia 02/20/2013    Past Surgical History:  Procedure Laterality Date  . APPENDECTOMY  1940  . bladder tack    . CATARACT EXTRACTION Bilateral   . HEMORRHOID SURGERY  1960  . HUMERUS FRACTURE SURGERY    . LAPAROSCOPIC HYSTERECTOMY  2006  . torn rotator cuff    . WRIST FRACTURE SURGERY  2005   seconday shoulder 2001    There were no vitals filed for this visit.  Subjective Assessment - 05/20/18 1535    Patient  Stated Goals  to stay more mobile, use right hand more    Currently in Pain?  No/denies                Treatment: Pt ambulated to gym with RW and supervision. Pt was able to maintain grip on walker with RUE. Supine closed chain shoulder flexion with therapist facilitating RUE elbow and shoulder, PNF diagonals with bilateral UE and upper trunk rotation, mod facilitation. Seated weight bearing edge of mat with body on arm movements followed by low range reaching with min-mod facilitation to grasp/ release 1 inch blocks to place in container. Pt was able to ambulate to waiting room with rolling walker and supervision, min-mod v.c for upright posture.               OT Short Term Goals - 05/12/18 1411      OT SHORT TERM GOAL #1   Title  Patient will complete a home exercise program designed to improve range of motion in right arm- with min prompting.      Time  6    Period  Weeks    Status  New    Target Date  06/26/18      OT SHORT TERM GOAL #2  Title  Pt will use RUE as a gross A/ stabilizer 10% of the time for ADLS.    Time  8    Period  Weeks    Status  New      OT SHORT TERM GOAL #3   Title  Pt will demonstrate ability to retrieve a lightweight object at 60* shoulder flexion.    Baseline  55*    Time  6    Period  Weeks    Status  New      OT SHORT TERM GOAL #4   Title  Pt will demonstrate improved endurance and functional mobility as evidenced wby ambulating to bathroom with supervision 2x per day.    Baseline  Pt is often using w/c to roll into to bathroom and transfer and she has been walking much less    Time  6    Period  Weeks    Status  New        OT Long Term Goals - 05/12/18 1412      OT LONG TERM GOAL #1   Title  Pt will perform updated HEP for RUE functional use with min prompts.    Time  12    Period  Weeks    Status  New    Target Date  08/10/18      OT LONG TERM GOAL #2   Title  Pt will report ability to use LUE as a gross  assist/ stabilizer at least 25 % of the time for ADLs.    Time  12    Period  Weeks    Status  New      OT LONG TERM GOAL #3   Title  Pt will demonstrate adequate grasp and RUE functional use to place/ remove several lightweight objects in the dishwasher with RUE with supervision    Time  12    Period  Weeks    Status  New      OT LONG TERM GOAL #4   Title  Pt will demonstrate ability to wipe down kitchen counter in standing using RUE with supervision.    Time  12    Period  Weeks    Status  New            Plan - 05/20/18 1540    Clinical Impression Statement  Pt is progressing towards goals. She demonstrates improved grasp/ relase of 1 inch blocks today.    Occupational Profile and client history currently impacting functional performance  CVA, HTN, osteoporosis, HLD  Pt reports a decline in her walking  and right UE functional use which impdes ADLs/IADLS. Pt reports she is scheduled for upcoming botox and needs information about how to proceed.    Occupational performance deficits (Please refer to evaluation for details):  ADL's;IADL's;Leisure    Rehab Potential  Fair    Current Impairments/barriers affecting progress:  severity of deficits, length of time since inital onset, spasticity    OT Frequency  2x / week    OT Duration  12 weeks    OT Treatment/Interventions  Self-care/ADL training;Therapeutic exercise;Gait Training;Aquatic Therapy;Moist Heat;Paraffin;Neuromuscular education;Stair Training;Splinting;Patient/family education;Balance training;Therapeutic activities;Functional Mobility Training;Fluidtherapy;Traction;Cryotherapy;Ultrasound;Contrast Bath;DME and/or AE instruction;Manual Therapy;Passive range of motion;Cognitive remediation/compensation    Plan  inital HEP, consider night splint for RUE    Consulted and Agree with Plan of Care  Patient       Patient will benefit from skilled therapeutic intervention in order to improve the following deficits and  impairments:  Abnormal  gait, Decreased cognition, Decreased knowledge of use of DME, Impaired flexibility, Decreased coordination, Decreased mobility, Decreased activity tolerance, Decreased endurance, Decreased range of motion, Decreased strength, Impaired tone, Impaired UE functional use, Impaired perceived functional ability, Difficulty walking, Decreased safety awareness, Decreased knowledge of precautions, Decreased balance  Visit Diagnosis: Muscle weakness (generalized)  Spastic hemiplegia of right dominant side as late effect of cerebral infarction (HCC)  Stiffness of right hand, not elsewhere classified  Abnormal posture    Problem List Patient Active Problem List   Diagnosis Date Noted  . Right spastic hemiplegia (HCC) 10/13/2017  . Acute lower UTI   . Labile blood pressure   . Abnormal urinalysis   . Trauma   . Closed compression fracture of L4 lumbar vertebra   . Neurogenic bladder   . History of hypertension   . Anemia of chronic disease   . Hypoalbuminemia due to protein-calorie malnutrition (HCC)   . Acute bilateral cerebral infarction in a watershed distribution [facility](HCC) 04/05/2017  . Benign essential HTN   . Chronic diastolic heart failure (HCC)   . Right spastic hemiparesis (HCC)   . History of stroke 02/16/2017  . Abnormality of gait following cerebrovascular accident (CVA) 02/16/2017  . Slow transit constipation   . Acute blood loss anemia   . Adjustment disorder with depressed mood   . Cerebrovascular accident (CVA) (HCC) 02/03/2016  . Dysarthria, post-stroke   . Dysphagia, post-stroke   . Leukocytosis   . Prediabetes   . Right hemiparesis (HCC)   . Cerebral infarction due to unspecified mechanism   . Other secondary hypertension   . Overactive bladder   . Diastolic dysfunction   . Hyperlipidemia   . Essential hypertension   . Essential tremor   . Stroke (cerebrum) (HCC) 02/01/2016  . Facial droop due to stroke 02/01/2016  . Essential and other  specified forms of tremor 02/20/2013  . Dysphonia 02/20/2013  . Trigeminal neuralgia 02/20/2013    Paulene Tayag 05/20/2018, 3:41 PM  [facility] [address] [place], [place], [address] Phone: [phone]   Fax:  [phone]  Name: Kelly [name] MRN: [id number] Date of Birth: Dec 08, 1927

## 2018-06-02 ENCOUNTER — Ambulatory Visit: Payer: Medicare Other | Attending: Adult Health | Admitting: Occupational Therapy

## 2018-06-02 ENCOUNTER — Ambulatory Visit: Payer: Medicare Other | Admitting: Physical Therapy

## 2018-06-02 ENCOUNTER — Encounter: Payer: Medicare Other | Admitting: Physical Medicine & Rehabilitation

## 2018-06-02 DIAGNOSIS — M6281 Muscle weakness (generalized): Secondary | ICD-10-CM

## 2018-06-02 DIAGNOSIS — M25641 Stiffness of right hand, not elsewhere classified: Secondary | ICD-10-CM | POA: Diagnosis not present

## 2018-06-02 DIAGNOSIS — I69351 Hemiplegia and hemiparesis following cerebral infarction affecting right dominant side: Secondary | ICD-10-CM

## 2018-06-02 DIAGNOSIS — R278 Other lack of coordination: Secondary | ICD-10-CM | POA: Insufficient documentation

## 2018-06-02 DIAGNOSIS — R2689 Other abnormalities of gait and mobility: Secondary | ICD-10-CM | POA: Diagnosis not present

## 2018-06-02 DIAGNOSIS — R293 Abnormal posture: Secondary | ICD-10-CM | POA: Insufficient documentation

## 2018-06-02 DIAGNOSIS — R2681 Unsteadiness on feet: Secondary | ICD-10-CM | POA: Diagnosis not present

## 2018-06-02 DIAGNOSIS — M25611 Stiffness of right shoulder, not elsewhere classified: Secondary | ICD-10-CM | POA: Diagnosis not present

## 2018-06-02 NOTE — Patient Instructions (Signed)
Bridging    Slowly raise buttocks from floor, keeping stomach tight. Repeat __10__ times per set. Do __1__ sets per session. Do _1__ sessions per day.  http://orth.exer.us/1097    Sit to Stand: Phase 2    Sitting, squeeze pelvic floor and hold. Lean trunk forward and push up on arms. Relax. Repeat _10__ times. Do __1_ times a day    HIP: Abduction / External Rotation - Side-Lying    Lie on side with hip and knees bent. Raise top knee up, squeezing glutes. Keep ankles together. _10__ reps per set, _1__ sets per day, __5_ days per week  LIE on LEFT SIDE - LIFT TOP LEG (RIGHT LEG) up   Straight Leg Raise    Tighten stomach and slowly raise locked right leg _10___ inches from bed. Repeat __10__ times per set. Do __1__ sets per session. Do __1__ sessions per day.  http://orth.exer.us/1103

## 2018-06-02 NOTE — Patient Instructions (Signed)
Gently stretch fingers and thumb in extension, 5-10 reps 1-2 x per day.  Hold cane with both hands while seated push cane forward on lap then bring cane or yardstick back to your chest, squeeze shoulder blades back 10 reps, 1-2 x day   Wipe tabletop in pain free range , perform standing if someone is with you, seated if not, 10 reps 1-2 x day  Wear your splint for 1-2 hours at a time 1-2 times per day while seated, stop wearing if pressure areas or redness.

## 2018-06-02 NOTE — Therapy (Signed)
[facility] [address] [place], [place], [address] Phone: [phone]   Fax:  [phone]  Occupational Therapy Treatment  Patient Details  Name: Kelly HumphreysBennie H Kott MRN: [id number] Date of Birth: January 02, 1928 Referring Provider (OT): Elder LoveJessica Vanschiack   Encounter Date: 06/02/2018  OT End of Session - 06/02/18 1658    Visit Number  3    Number of Visits  25    Authorization Type  Medicare    Authorization Time Period  90 days, anticipate d/c after 8 weeks    Authorization - Visit Number  3    Authorization - Number of Visits  10    OT Start Time  1450    OT Stop Time  1530    OT Time Calculation (min)  40 min    Activity Tolerance  Patient tolerated treatment well    Behavior During Therapy  [facility] for tasks assessed/performed       Past Medical History:  Diagnosis Date  . Acute blood loss anemia   . Adjustment disorder with depressed mood   . CVA (cerebral vascular accident) (HCC) 02/03/2016   Linear infarct within the posterior limb of left internal capsule  . Diastolic CHF (HCC)   . Dysphagia   . Dysphonia 02/20/2013  . Essential and other specified forms of tremor 02/20/2013  . HLD (hyperlipidemia)   . HTN (hypertension)   . OAB (overactive bladder)   . Osteoporosis   . Patient receiving subcutaneous heparin    For DVT prophylaxis 9/17  . Prediabetes   . Psoriasis   . Slow transit constipation   . Stroke (cerebrum) (HCC) 02/01/2016  . Trigeminal neuralgia 02/20/2013    Past Surgical History:  Procedure Laterality Date  . APPENDECTOMY  1940  . bladder tack    . CATARACT EXTRACTION Bilateral   . HEMORRHOID SURGERY  1960  . HUMERUS FRACTURE SURGERY    . LAPAROSCOPIC HYSTERECTOMY  2006  . torn rotator cuff    . WRIST FRACTURE SURGERY  2005   seconday shoulder 2001    There were no vitals filed for this visit.  Subjective Assessment - 06/02/18 1701    Subjective   Pt report she's been walking more at  home    Patient Stated Goals  to stay more mobile, use right hand more    Currently in Pain?  No/denies           Treatment: Therapist assessed previously issued resting hand splint. It fits well, pt returned demonstration of application and removal. Pt/ husband were instructed in updates to exercises, see pt instructions. Standing to perform gentle table slides with RUE for low range shoulder flexion, min v.c Functional grasp release of 1 inch blocks, min-mod facilitation. Gentle passive stretch to fingers and thumb, low range A/ROM shoulder flexion with cane followed by scapular retraction, min v.c                  OT Short Term Goals - 05/12/18 1411      OT SHORT TERM GOAL #1   Title  Patient will complete a home exercise program designed to improve range of motion in right arm- with min prompting.      Time  6    Period  Weeks    Status  New    Target Date  06/26/18      OT SHORT TERM GOAL #2   Title  Pt will use RUE as a gross A/ stabilizer 10% of  the time for ADLS.    Time  8    Period  Weeks    Status  New      OT SHORT TERM GOAL #3   Title  Pt will demonstrate ability to retrieve a lightweight object at 60* shoulder flexion.    Baseline  55*    Time  6    Period  Weeks    Status  New      OT SHORT TERM GOAL #4   Title  Pt will demonstrate improved endurance and functional mobility as evidenced wby ambulating to bathroom with supervision 2x per day.    Baseline  Pt is often using w/c to roll into to bathroom and transfer and she has been walking much less    Time  6    Period  Weeks    Status  New        OT Long Term Goals - 05/12/18 1412      OT LONG TERM GOAL #1   Title  Pt will perform updated HEP for RUE functional use with min prompts.    Time  12    Period  Weeks    Status  New    Target Date  08/10/18      OT LONG TERM GOAL #2   Title  Pt will report ability to use LUE as a gross assist/ stabilizer at least 25 % of the time for  ADLs.    Time  12    Period  Weeks    Status  New      OT LONG TERM GOAL #3   Title  Pt will demonstrate adequate grasp and RUE functional use to place/ remove several lightweight objects in the dishwasher with RUE with supervision    Time  12    Period  Weeks    Status  New      OT LONG TERM GOAL #4   Title  Pt will demonstrate ability to wipe down kitchen counter in standing using RUE with supervision.    Time  12    Period  Weeks    Status  New            Plan - 06/02/18 1659    Clinical Impression Statement  Pt is progressing towards goals. Pt/ husband demonstrate understanding of recommendations for splint wear and updates to exercises.    Occupational Profile and client history currently impacting functional performance  CVA, HTN, osteoporosis, HLD  Pt reports a decline in her walking  and right UE functional use which impdes ADLs/IADLS. Pt reports she is scheduled for upcoming botox and needs information about how to proceed.    Rehab Potential  Fair    Current Impairments/barriers affecting progress:  severity of deficits, length of time since inital onset, spasticity    OT Frequency  2x / week    OT Duration  12 weeks    OT Treatment/Interventions  Self-care/ADL training;Therapeutic exercise;Gait Training;Aquatic Therapy;Moist Heat;Paraffin;Neuromuscular education;Stair Training;Splinting;Patient/family education;Balance training;Therapeutic activities;Functional Mobility Training;Fluidtherapy;Traction;Cryotherapy;Ultrasound;Contrast Bath;DME and/or AE instruction;Manual Therapy;Passive range of motion;Cognitive remediation/compensation    Plan  progress HEP, send in basket to MD regarding botox recommendations    Consulted and Agree with Plan of Care  Patient;Family member/caregiver       Patient will benefit from skilled therapeutic intervention in order to improve the following deficits and impairments:  Abnormal gait, Decreased cognition, Decreased knowledge of use  of DME, Impaired flexibility, Decreased coordination, Decreased mobility, Decreased activity tolerance, Decreased endurance,  Decreased range of motion, Decreased strength, Impaired tone, Impaired UE functional use, Impaired perceived functional ability, Difficulty walking, Decreased safety awareness, Decreased knowledge of precautions, Decreased balance  Visit Diagnosis: Muscle weakness (generalized)  Spastic hemiplegia of right dominant side as late effect of cerebral infarction (HCC)  Stiffness of right hand, not elsewhere classified  Abnormal posture  Stiffness of right shoulder, not elsewhere classified  Other lack of coordination    Problem List Patient Active Problem List   Diagnosis Date Noted  . Right spastic hemiplegia (HCC) 10/13/2017  . Acute lower UTI   . Labile blood pressure   . Abnormal urinalysis   . Trauma   . Closed compression fracture of L4 lumbar vertebra   . Neurogenic bladder   . History of hypertension   . Anemia of chronic disease   . Hypoalbuminemia due to protein-calorie malnutrition (HCC)   . Acute bilateral cerebral infarction in a watershed distribution [facility]) 04/05/2017  . Benign essential HTN   . Chronic diastolic heart failure (HCC)   . Right spastic hemiparesis (HCC)   . History of stroke 02/16/2017  . Abnormality of gait following cerebrovascular accident (CVA) 02/16/2017  . Slow transit constipation   . Acute blood loss anemia   . Adjustment disorder with depressed mood   . Cerebrovascular accident (CVA) (HCC) 02/03/2016  . Dysarthria, post-stroke   . Dysphagia, post-stroke   . Leukocytosis   . Prediabetes   . Right hemiparesis (HCC)   . Cerebral infarction due to unspecified mechanism   . Other secondary hypertension   . Overactive bladder   . Diastolic dysfunction   . Hyperlipidemia   . Essential hypertension   . Essential tremor   . Stroke (cerebrum) (HCC) 02/01/2016  . Facial droop due to stroke 02/01/2016  . Essential and  other specified forms of tremor 02/20/2013  . Dysphonia 02/20/2013  . Trigeminal neuralgia 02/20/2013    Tavish Gettis 06/02/2018, 5:01 PM  [facility] [address] [place], [place], [address] Phone: [phone]   Fax:  [phone]  Name: OMAYA NIELAND MRN: [id number] Date of Birth: 23-Nov-1927

## 2018-06-03 NOTE — Therapy (Addendum)
[facility] [address] [place], [place], [address] Phone: [phone]   Fax:  [phone]  Physical Therapy Treatment  Patient Details  Name: Kelly [name] MRN: [id number] Date of Birth: 29-May-1927 Referring Provider (PT): [name] Hugh, NP   Encounter Date: 06/02/2018  PT End of Session - 06/05/18 2021    Visit Number  2    Number of Visits  17    Date for PT Re-Evaluation  07/20/18    Authorization Type  Medicare and [employer] [facility]    PT Start Time  1535    PT Stop Time  1622    PT Time Calculation (min)  47 min       Past Medical History:  Diagnosis Date  . Acute blood loss anemia   . Adjustment disorder with depressed mood   . CVA (cerebral vascular accident) (HCC) 02/03/2016   Linear infarct within the posterior limb of left internal capsule  . Diastolic CHF (HCC)   . Dysphagia   . Dysphonia 02/20/2013  . Essential and other specified forms of tremor 02/20/2013  . HLD (hyperlipidemia)   . HTN (hypertension)   . OAB (overactive bladder)   . Osteoporosis   . Patient receiving subcutaneous heparin    For DVT prophylaxis 9/17  . Prediabetes   . Psoriasis   . Slow transit constipation   . Stroke (cerebrum) (HCC) 02/01/2016  . Trigeminal neuralgia 02/20/2013    Past Surgical History:  Procedure Laterality Date  . APPENDECTOMY  1940  . bladder tack    . CATARACT EXTRACTION Bilateral   . HEMORRHOID SURGERY  1960  . HUMERUS FRACTURE SURGERY    . LAPAROSCOPIC HYSTERECTOMY  2006  . torn rotator cuff    . WRIST FRACTURE SURGERY  2005   seconday shoulder 2001    There were no vitals filed for this visit.                    [facility] Adult PT Treatment/Exercise - 06/05/18 0001      Transfers   Transfers  Sit to Stand    Sit to Stand  4: Min assist    Stand to Sit  4: Min assist    Number of Reps  Other reps (comment)   5   Comments  with UE support      Ambulation/Gait   Ambulation/Gait  Yes    Ambulation/Gait Assistance  4: Min guard    Ambulation Distance (Feet)  230 Feet    Assistive device  Rolling walker    Gait Pattern  Step-to pattern;Step-through pattern;Decreased stride length;Decreased hip/knee flexion - right;Decreased weight shift to right;Trunk flexed    Ambulation Surface  Level;Indoor      Exercises   Exercises  Lumbar;Knee/Hip;Ankle      Knee/Hip Exercises: Supine   Heel Slides  AROM;Right;1 set;10 reps    Bridges  AROM;Both;1 set;10 reps    Single Leg Bridge  AROM;Right;1 set;10 reps    Straight Leg Raises  AROM;Right;1 set;10 reps    Other Supine Knee/Hip Exercises  Rt hip extension control exercise      Knee/Hip Exercises: Sidelying   Clams  10 reps RLE    Other Sidelying Knee/Hip Exercises  Rt knee flexion/extension 10 reps with min assistance               PT Short Term Goals - 05/11/18 1617      PT SHORT TERM GOAL #1   Title  patient to be independent with initial HEP for balance, strength, mobility    Time  4    Period  Weeks    Status  New    Target Date  06/15/18      PT SHORT TERM GOAL #2   Title  patient to demonstrate 5x sit to stand without assist from PT/family with use of armrests in </= 30 seconds    Baseline  35.65 seconds with intermittent Min A from PT    Time  4    Period  Weeks    Status  New    Target Date  06/15/18      PT SHORT TERM GOAL #3   Title  patient to improve gait velocity by >/= 0.4 ft/sec    Baseline  1.23 ft/sec    Time  4    Period  Weeks    Status  New    Target Date  06/15/18      PT SHORT TERM GOAL #4   Title  patient to report ability to increase daily walking program to 2x 15 minutes     Baseline  2 x 10 min    Time  4    Period  Weeks    Status  New    Target Date  06/15/18        PT Long Term Goals - 05/11/18 1619      PT LONG TERM GOAL #1   Title  patient to be independent with advanced HEP    Time  8    Period  Weeks    Status  New    Target Date   07/20/18      PT LONG TERM GOAL #2   Title  patient to improve 6 min walk test to >/= 425 ft with LRAD    Baseline  313 ft with RW    Time  8    Period  Weeks    Status  New    Target Date  07/20/18      PT LONG TERM GOAL #3   Title  patient to demonstrate improved gait velocity by >/= 0.8 ft/sec from baseline    Baseline  1.23 ft/sec    Time  8    Period  Weeks    Status  New    Target Date  07/20/18      PT LONG TERM GOAL #4   Title  patient to demonstrate gait over indoor/outdoor surfaces for >/= 500 feet with LRAD at SUP for safe community ambulation    Time  8    Period  Weeks    Status  New    Target Date  07/20/18              Patient will benefit from skilled therapeutic intervention in order to improve the following deficits and impairments:     Visit Diagnosis: Other abnormalities of gait and mobility  Spastic hemiplegia of right dominant side as late effect of cerebral infarction [facility])  Muscle weakness (generalized)     Problem List Patient Active Problem List   Diagnosis Date Noted  . Right spastic hemiplegia (HCC) 10/13/2017  . Acute lower UTI   . Labile blood pressure   . Abnormal urinalysis   . Trauma   . Closed compression fracture of L4 lumbar vertebra   . Neurogenic bladder   . History of hypertension   . Anemia of chronic disease   . Hypoalbuminemia due to protein-calorie malnutrition (HCC)   .  Acute bilateral cerebral infarction in a watershed distribution [facility](HCC) 04/05/2017  . Benign essential HTN   . Chronic diastolic heart failure (HCC)   . Right spastic hemiparesis (HCC)   . History of stroke 02/16/2017  . Abnormality of gait following cerebrovascular accident (CVA) 02/16/2017  . Slow transit constipation   . Acute blood loss anemia   . Adjustment disorder with depressed mood   . Cerebrovascular accident (CVA) (HCC) 02/03/2016  . Dysarthria, post-stroke   . Dysphagia, post-stroke   . Leukocytosis   . Prediabetes   . Right  hemiparesis (HCC)   . Cerebral infarction due to unspecified mechanism   . Other secondary hypertension   . Overactive bladder   . Diastolic dysfunction   . Hyperlipidemia   . Essential hypertension   . Essential tremor   . Stroke (cerebrum) (HCC) 02/01/2016  . Facial droop due to stroke 02/01/2016  . Essential and other specified forms of tremor 02/20/2013  . Dysphonia 02/20/2013  . Trigeminal neuralgia 02/20/2013    DildayDonavan Burnet, Blondine Hottel Suzanne, PT 06/05/2018, 8:22 PM  [facility] [address] [place], [place], [address] Phone: [phone]   Fax:  [phone]  Name: Dan HumphreysBennie H Nickey MRN: [id number] Date of Birth: [birth year]

## 2018-06-07 ENCOUNTER — Ambulatory Visit: Payer: Medicare Other | Admitting: Occupational Therapy

## 2018-06-07 ENCOUNTER — Ambulatory Visit: Payer: Medicare Other | Admitting: Physical Therapy

## 2018-06-07 DIAGNOSIS — M6281 Muscle weakness (generalized): Secondary | ICD-10-CM

## 2018-06-07 DIAGNOSIS — M25611 Stiffness of right shoulder, not elsewhere classified: Secondary | ICD-10-CM | POA: Diagnosis not present

## 2018-06-07 DIAGNOSIS — R278 Other lack of coordination: Secondary | ICD-10-CM

## 2018-06-07 DIAGNOSIS — I69351 Hemiplegia and hemiparesis following cerebral infarction affecting right dominant side: Secondary | ICD-10-CM

## 2018-06-07 DIAGNOSIS — R2689 Other abnormalities of gait and mobility: Secondary | ICD-10-CM

## 2018-06-07 DIAGNOSIS — R293 Abnormal posture: Secondary | ICD-10-CM

## 2018-06-07 DIAGNOSIS — M25641 Stiffness of right hand, not elsewhere classified: Secondary | ICD-10-CM | POA: Diagnosis not present

## 2018-06-08 NOTE — Therapy (Signed)
[facility] [address] [place], [place], [address] Phone: [phone]   Fax:  [phone]  Physical Therapy Treatment  Patient Details  Name: Kelly [name] MRN: [id number] Date of Birth: Oct 19, 1927 Referring Provider (PT): [name] Hugh, NP   Encounter Date: 06/07/2018  PT End of Session - 06/08/18 1128    Visit Number  3    Number of Visits  17    Date for PT Re-Evaluation  07/20/18    Authorization Type  Medic[facility] and [employer] [facility]    PT Start Time  1447    PT Stop Time  1531    PT Time Calculation (min)  44 min       Past Medical History:  Diagnosis Date  . Acute blood loss anemia   . Adjustment disorder with depressed mood   . CVA (cerebral vascular accident) (HCC) 02/03/2016   Linear infarct within the posterior limb of left internal capsule  . Diastolic CHF (HCC)   . Dysphagia   . Dysphonia 02/20/2013  . Essential and other specified forms of tremor 02/20/2013  . HLD (hyperlipidemia)   . HTN (hypertension)   . OAB (overactive bladder)   . Osteoporosis   . Patient receiving subcutaneous heparin    For DVT prophylaxis 9/17  . Prediabetes   . Psoriasis   . Slow transit constipation   . Stroke (cerebrum) (HCC) 02/01/2016  . Trigeminal neuralgia 02/20/2013    Past Surgical History:  Procedure Laterality Date  . APPENDECTOMY  1940  . bladder tack    . CATARACT EXTRACTION Bilateral   . HEMORRHOID SURGERY  1960  . HUMERUS FRACTURE SURGERY    . LAPAROSCOPIC HYSTERECTOMY  2006  . torn rotator cuff    . WRIST FRACTURE SURGERY  2005   seconday shoulder 2001    There were no vitals filed for this visit.  Subjective Assessment - 06/08/18 1123    Subjective  Pt reports she has been doing the 4 exercises at home as instructed last Thursday    Patient is accompained by:  --   husband [name] - left during tx session   Pertinent History  CVA, HTN, osteoporosis, HLD    Patient Stated Goals  find a way to  walk better with RW.     Currently in Pain?  No/denies                       [facility] Adult PT Treatment/Exercise - 06/08/18 0001      Transfers   Transfers  Sit to Stand    Sit to Stand  4: Min guard    Stand to Sit  4: Min assist    Number of Reps  Other reps (comment)   3 reps   Comments  with LUE support fom mat      Ambulation/Gait   Ambulation/Gait  Yes    Ambulation/Gait Assistance  4: Min guard    Ambulation Distance (Feet)  300 Feet    Assistive device  Rolling walker    Gait Pattern  Step-to pattern;Step-through pattern;Decreased stride length;Decreased hip/knee flexion - right;Decreased weight shift to right;Trunk flexed    Ambulation Surface  Level;Indoor    Gait Comments  verbal cues to keep shoulders up to facilitate more erect posture      Knee/Hip Exercises: Supine   Heel Slides  AROM;Right;1 set;10 reps    Bridges  AROM;Both;1 set;10 reps    Single Leg Bridge  AROM;Right;1 set;10  reps    Straight Leg Raises  AROM;Right;1 set;10 reps               PT Short Term Goals - 05/11/18 1617      PT SHORT TERM GOAL #1   Title  patient to be independent with initial HEP for balance, strength, mobility    Time  4    Period  Weeks    Status  New    Target Date  06/15/18      PT SHORT TERM GOAL #2   Title  patient to demonstrate 5x sit to stand without assist from PT/family with use of armrests in </= 30 seconds    Baseline  35.65 seconds with intermittent Min A from PT    Time  4    Period  Weeks    Status  New    Target Date  06/15/18      PT SHORT TERM GOAL #3   Title  patient to improve gait velocity by >/= 0.4 ft/sec    Baseline  1.23 ft/sec    Time  4    Period  Weeks    Status  New    Target Date  06/15/18      PT SHORT TERM GOAL #4   Title  patient to report ability to increase daily walking program to 2x 15 minutes     Baseline  2 x 10 min    Time  4    Period  Weeks    Status  New    Target Date  06/15/18        PT  Long Term Goals - 05/11/18 1619      PT LONG TERM GOAL #1   Title  patient to be independent with advanced HEP    Time  8    Period  Weeks    Status  New    Target Date  07/20/18      PT LONG TERM GOAL #2   Title  patient to improve 6 min walk test to >/= 425 ft with LRAD    Baseline  313 ft with RW    Time  8    Period  Weeks    Status  New    Target Date  07/20/18      PT LONG TERM GOAL #3   Title  patient to demonstrate improved gait velocity by >/= 0.8 ft/sec from baseline    Baseline  1.23 ft/sec    Time  8    Period  Weeks    Status  New    Target Date  07/20/18      PT LONG TERM GOAL #4   Title  patient to demonstrate gait over indoor/outdoor surfaces for >/= 500 feet with LRAD at SUP for safe community ambulation    Time  8    Period  Weeks    Status  New    Target Date  07/20/18            Plan - 06/08/18 1128    Clinical Impression Statement  Pt has symptoms consistent with possible right trochanteric bursitis as she screamed out in pain after rolling onto her left side to perform Rt hip abduction.  Pt immediately was transferred out of left sidelying position and rolled back onto her back for pain relief: pt had point tenderness with palpation over right trochanter region.  No c/o's pain were reported in previous PT session on Thurs., 06-02-18 and pt  reported that she did not have pain with weight bearing or with ambulation.  Pt instructed to discuss this pain with Dr. Allena KatzPatel at appt scheduled for 06-10-18.      Rehab Potential  Good    Clinical Impairments Affecting Rehab Potential  chronicity of impairments and advanced age    PT Frequency  2x / week    PT Duration  8 weeks    PT Treatment/Interventions  ADLs/Self Care Home Management;Functional mobility training;Stair training;Gait training;DME Instruction;Therapeutic activities;Therapeutic exercise;Balance training;Neuromuscular re-education;Patient/family education;Passive range of motion;Manual  techniques;Taping    PT Next Visit Plan  assess Rt hip pain - bursitis?:  message to be sent to Dr. Allena KatzPatel prior to her 05-31-18 appt with him:  continue RLE strengthening and gait training as tolerated; postural retraining    PT Home Exercise Plan  bridging, sit to stand, SLR and clam shell exs given on 06-02-18    Recommended Other Services  pt is receiving OT also    Consulted and Agree with Plan of Care  Patient       Patient will benefit from skilled therapeutic intervention in order to improve the following deficits and impairments:  Abnormal gait, Decreased activity tolerance, Decreased balance, Decreased mobility, Decreased strength, Decreased endurance, Decreased safety awareness, Difficulty walking  Visit Diagnosis: Other abnormalities of gait and mobility  Spastic hemiplegia of right dominant side as late effect of cerebral infarction [facility](HCC)  Muscle weakness (generalized)     Problem List Patient Active Problem List   Diagnosis Date Noted  . Right spastic hemiplegia (HCC) 10/13/2017  . Acute lower UTI   . Labile blood pressure   . Abnormal urinalysis   . Trauma   . Closed compression fracture of L4 lumbar vertebra   . Neurogenic bladder   . History of hypertension   . Anemia of chronic disease   . Hypoalbuminemia due to protein-calorie malnutrition (HCC)   . Acute bilateral cerebral infarction in a watershed distribution [facility](HCC) 04/05/2017  . Benign essential HTN   . Chronic diastolic heart failure (HCC)   . Right spastic hemiparesis (HCC)   . History of stroke 02/16/2017  . Abnormality of gait following cerebrovascular accident (CVA) 02/16/2017  . Slow transit constipation   . Acute blood loss anemia   . Adjustment disorder with depressed mood   . Cerebrovascular accident (CVA) (HCC) 02/03/2016  . Dysarthria, post-stroke   . Dysphagia, post-stroke   . Leukocytosis   . Prediabetes   . Right hemiparesis (HCC)   . Cerebral infarction due to unspecified mechanism   .  Other secondary hypertension   . Overactive bladder   . Diastolic dysfunction   . Hyperlipidemia   . Essential hypertension   . Essential tremor   . Stroke (cerebrum) (HCC) 02/01/2016  . Facial droop due to stroke 02/01/2016  . Essential and other specified forms of tremor 02/20/2013  . Dysphonia 02/20/2013  . Trigeminal neuralgia 02/20/2013    Kary Kosilday, Angellynn Kimberlin Suzanne, PT 06/08/2018, 11:39 AM  [facility] [address] [place], [place], [address] Phone: [phone]   Fax:  [phone]  Name: Kelly [name] MRN: [id number] Date of Birth: [birth year]

## 2018-06-08 NOTE — Therapy (Signed)
[facility] [address] [place], [place], [address] Phone: [phone]   Fax:  [phone]  Occupational Therapy Treatment  Patient Details  Name: Kelly HumphreysBennie H [name] MRN: [id number] Date of Birth: 05/30/1927 Referring Provider (OT): Kelly LoveJessica [name]   Encounter Date: 06/07/2018  OT End of Session - 06/07/18 1725    Visit Number  4    Number of Visits  25    Date for OT Re-Evaluation  08/09/18    Authorization Type  Medicare    Authorization Time Period  90 days, anticipate d/c after 8 weeks    Authorization - Visit Number  4    Authorization - Number of Visits  10    OT Start Time  1535    OT Stop Time  1615    OT Time Calculation (min)  40 min    Activity Tolerance  Patient tolerated treatment well    Behavior During Therapy  [facility] for tasks assessed/performed       Past Medical History:  Diagnosis Date  . Acute blood loss anemia   . Adjustment disorder with depressed mood   . CVA (cerebral vascular accident) (HCC) 02/03/2016   Linear infarct within the posterior limb of left internal capsule  . Diastolic CHF (HCC)   . Dysphagia   . Dysphonia 02/20/2013  . Essential and other specified forms of tremor 02/20/2013  . HLD (hyperlipidemia)   . HTN (hypertension)   . OAB (overactive bladder)   . Osteoporosis   . Patient receiving subcutaneous heparin    For DVT prophylaxis 9/17  . Prediabetes   . Psoriasis   . Slow transit constipation   . Stroke (cerebrum) (HCC) 02/01/2016  . Trigeminal neuralgia 02/20/2013    Past Surgical History:  Procedure Laterality Date  . APPENDECTOMY  1940  . bladder tack    . CATARACT EXTRACTION Bilateral   . HEMORRHOID SURGERY  1960  . HUMERUS FRACTURE SURGERY    . LAPAROSCOPIC HYSTERECTOMY  2006  . torn rotator cuff    . WRIST FRACTURE SURGERY  2005   seconday shoulder 2001    There were no vitals filed for this visit.        Treatment: Supine unilateral RUE AA/ROM  and closed chain shoulder flexion and chest press with PVC pipe frame, min-mod facilitation. Seated edge of mat weightbearing through hands followed by scapular retraction as able. Low range shoulder flexion with UE ranger, min-mod facilitation. Functional low range grasp release of cylindrical objects, mod facilitation. Therapist plans to message MD regarding botox recommendations next visit. Pt may benefit from more botox in elbow, shoulder, pecs and less in hand so that pt can maintain grasp for walker and holding objects. Seated scapular retraction and neck retraction against wall to promote upright posture, min v.c Pt attempted in standing but reports discomfort.  Pt was cautioned against lifting weights in seated with  RUE as it may increase spasticity. Pt/ husband report pt holds a 1 lbs weight with bilateral UE's in supine.                  OT Short Term Goals - 05/12/18 1411      OT SHORT TERM GOAL #1   Title  Patient will complete a home exercise program designed to improve range of motion in right arm- with min prompting.      Time  6    Period  Weeks    Status  New    Target  Date  06/26/18      OT SHORT TERM GOAL #2   Title  Pt will use RUE as a gross A/ stabilizer 10% of the time for ADLS.    Time  8    Period  Weeks    Status  New      OT SHORT TERM GOAL #3   Title  Pt will demonstrate ability to retrieve a lightweight object at 60* shoulder flexion.    Baseline  55*    Time  6    Period  Weeks    Status  New      OT SHORT TERM GOAL #4   Title  Pt will demonstrate improved endurance and functional mobility as evidenced wby ambulating to bathroom with supervision 2x per day.    Baseline  Pt is often using w/c to roll into to bathroom and transfer and she has been walking much less    Time  6    Period  Weeks    Status  New        OT Long Term Goals - 05/12/18 1412      OT LONG TERM GOAL #1   Title  Pt will perform updated HEP for RUE functional  use with min prompts.    Time  12    Period  Weeks    Status  New    Target Date  08/10/18      OT LONG TERM GOAL #2   Title  Pt will report ability to use LUE as a gross assist/ stabilizer at least 25 % of the time for ADLs.    Time  12    Period  Weeks    Status  New      OT LONG TERM GOAL #3   Title  Pt will demonstrate adequate grasp and RUE functional use to place/ remove several lightweight objects in the dishwasher with RUE with supervision    Time  12    Period  Weeks    Status  New      OT LONG TERM GOAL #4   Title  Pt will demonstrate ability to wipe down kitchen counter in standing using RUE with supervision.    Time  12    Period  Weeks    Status  New            Plan - 06/08/18 1634    Clinical Impression Statement  Pt is progressing towards goals. she reports wearing her splint several hours per day.    Occupational Profile and client history currently impacting functional performance  CVA, HTN, osteoporosis, HLD  Pt reports a decline in her walking  and right UE functional use which impdes ADLs/IADLS. Pt reports she is scheduled for upcoming botox and needs information about how to proceed.    Occupational performance deficits (Please refer to evaluation for details):  ADL's;IADL's;Leisure    Rehab Potential  Fair    Current Impairments/barriers affecting progress:  severity of deficits, length of time since inital onset, spasticity    OT Frequency  2x / week    OT Duration  12 weeks    OT Treatment/Interventions  Self-care/ADL training;Therapeutic exercise;Gait Training;Aquatic Therapy;Moist Heat;Paraffin;Neuromuscular education;Stair Training;Splinting;Patient/family education;Balance training;Therapeutic activities;Functional Mobility Training;Fluidtherapy;Traction;Cryotherapy;Ultrasound;Contrast Bath;DME and/or AE instruction;Manual Therapy;Passive range of motion;Cognitive remediation/compensation    Plan  NMR, progress HEP, send in basket to MD regarding  botox recommendations    Consulted and Agree with Plan of Care  Patient;Family member/caregiver  Patient will benefit from skilled therapeutic intervention in order to improve the following deficits and impairments:  Abnormal gait, Decreased cognition, Decreased knowledge of use of DME, Impaired flexibility, Decreased coordination, Decreased mobility, Decreased activity tolerance, Decreased endurance, Decreased range of motion, Decreased strength, Impaired tone, Impaired UE functional use, Impaired perceived functional ability, Difficulty walking, Decreased safety awareness, Decreased knowledge of precautions, Decreased balance  Visit Diagnosis: Spastic hemiplegia of right dominant side as late effect of cerebral infarction (HCC)  Muscle weakness (generalized)  Stiffness of right hand, not elsewhere classified  Abnormal posture  Stiffness of right shoulder, not elsewhere classified  Other lack of coordination    Problem List Patient Active Problem List   Diagnosis Date Noted  . Right spastic hemiplegia (HCC) 10/13/2017  . Acute lower UTI   . Labile blood pressure   . Abnormal urinalysis   . Trauma   . Closed compression fracture of L4 lumbar vertebra   . Neurogenic bladder   . History of hypertension   . Anemia of chronic disease   . Hypoalbuminemia due to protein-calorie malnutrition (HCC)   . Acute bilateral cerebral infarction in a watershed distribution [facility]) 04/05/2017  . Benign essential HTN   . Chronic diastolic heart failure (HCC)   . Right spastic hemiparesis (HCC)   . History of stroke 02/16/2017  . Abnormality of gait following cerebrovascular accident (CVA) 02/16/2017  . Slow transit constipation   . Acute blood loss anemia   . Adjustment disorder with depressed mood   . Cerebrovascular accident (CVA) (HCC) 02/03/2016  . Dysarthria, post-stroke   . Dysphagia, post-stroke   . Leukocytosis   . Prediabetes   . Right hemiparesis (HCC)   . Cerebral  infarction due to unspecified mechanism   . Other secondary hypertension   . Overactive bladder   . Diastolic dysfunction   . Hyperlipidemia   . Essential hypertension   . Essential tremor   . Stroke (cerebrum) (HCC) 02/01/2016  . Facial droop due to stroke 02/01/2016  . Essential and other specified forms of tremor 02/20/2013  . Dysphonia 02/20/2013  . Trigeminal neuralgia 02/20/2013    Kelly [name],Kelly [name] 06/08/2018, 4:36 PM  [facility] [address] [place], [place], [address] Phone: [phone]   Fax:  [phone]  Name: Kelly [name] MRN: [id number] Date of Birth: 1928-03-08

## 2018-06-09 ENCOUNTER — Ambulatory Visit: Payer: Medicare Other | Admitting: Physical Therapy

## 2018-06-09 ENCOUNTER — Encounter: Payer: Self-pay | Admitting: Physical Therapy

## 2018-06-09 ENCOUNTER — Ambulatory Visit: Payer: Medicare Other | Admitting: Occupational Therapy

## 2018-06-09 DIAGNOSIS — M25641 Stiffness of right hand, not elsewhere classified: Secondary | ICD-10-CM

## 2018-06-09 DIAGNOSIS — I69351 Hemiplegia and hemiparesis following cerebral infarction affecting right dominant side: Secondary | ICD-10-CM

## 2018-06-09 DIAGNOSIS — R293 Abnormal posture: Secondary | ICD-10-CM | POA: Diagnosis not present

## 2018-06-09 DIAGNOSIS — R278 Other lack of coordination: Secondary | ICD-10-CM

## 2018-06-09 DIAGNOSIS — M6281 Muscle weakness (generalized): Secondary | ICD-10-CM

## 2018-06-09 DIAGNOSIS — M25611 Stiffness of right shoulder, not elsewhere classified: Secondary | ICD-10-CM | POA: Diagnosis not present

## 2018-06-09 DIAGNOSIS — R2689 Other abnormalities of gait and mobility: Secondary | ICD-10-CM

## 2018-06-09 NOTE — Therapy (Signed)
[facility] [address] [place], [place], [address] Phone: [phone]   Fax:  [phone]  Occupational Therapy Treatment  Patient Details  Name: Kelly [name] MRN: [id number] Date of Birth: November 22, 1927 Referring Provider (OT): Elder Love   Encounter Date: 06/09/2018  OT End of Session - 06/09/18 1658    Visit Number  5    Number of Visits  25    Date for OT Re-Evaluation  08/09/18    Authorization Type  Medicare    Authorization Time Period  90 days, anticipate d/c after 8 weeks    Authorization - Visit Number  5    Authorization - Number of Visits  10    OT Start Time  1405    OT Stop Time  1445    OT Time Calculation (min)  40 min    Activity Tolerance  Patient tolerated treatment well    Behavior During Therapy  [facility] for tasks assessed/performed       Past Medical History:  Diagnosis Date  . Acute blood loss anemia   . Adjustment disorder with depressed mood   . CVA (cerebral vascular accident) (HCC) 02/03/2016   Linear infarct within the posterior limb of left internal capsule  . Diastolic CHF (HCC)   . Dysphagia   . Dysphonia 02/20/2013  . Essential and other specified forms of tremor 02/20/2013  . HLD (hyperlipidemia)   . HTN (hypertension)   . OAB (overactive bladder)   . Osteoporosis   . Patient receiving subcutaneous heparin    For DVT prophylaxis 9/17  . Prediabetes   . Psoriasis   . Slow transit constipation   . Stroke (cerebrum) (HCC) 02/01/2016  . Trigeminal neuralgia 02/20/2013    Past Surgical History:  Procedure Laterality Date  . APPENDECTOMY  1940  . bladder tack    . CATARACT EXTRACTION Bilateral   . HEMORRHOID SURGERY  1960  . HUMERUS FRACTURE SURGERY    . LAPAROSCOPIC HYSTERECTOMY  2006  . torn rotator cuff    . WRIST FRACTURE SURGERY  2005   seconday shoulder 2001    There were no vitals filed for this visit.           Treatment: chest press seated  with cane, min v.c for elbow extension then scapular retraction. Seated focus on upright posture and scapular retraction followed by standing for low range AA/ROM shoulder flexion along tabletop with RUE on towel, min A for weight shift forward during sit to stand, minguard for balance. Pt attempted to flip cards with RUE, max difficulty. 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 Picking up 1 inch blocks with RUE from low surface to place in container, min-mod facilitation. Therapist assessed A/ROM shoulder flexion in seated:45 , P/ROM 90* Elbow extension A/ROM -50, P/ROM -30 Pt and husband agree that botox would be most beneficial to elbow, shoulder and pecs. Pt does not wish to receive botox to the hand as she is using he spasticity to hold onto walker.In basket message sent to MD.              OT Short Term Goals - 05/12/18 1411      OT SHORT TERM GOAL #1   Title  Patient will complete a home exercise program designed to improve range of motion in right arm- with min prompting.      Time  6    Period  Weeks    Status  New    Target Date  06/26/18      OT SHORT TERM GOAL #2   Title  Pt will use RUE as a gross A/ stabilizer 10% of the time for ADLS.    Time  8    Period  Weeks    Status  New      OT SHORT TERM GOAL #3   Title  Pt will demonstrate ability to retrieve a lightweight object at 60* shoulder flexion.    Baseline  55*    Time  6    Period  Weeks    Status  New      OT SHORT TERM GOAL #4   Title  Pt will demonstrate improved endurance and functional mobility as evidenced wby ambulating to bathroom with supervision 2x per day.    Baseline  Pt is often using w/c to roll into to bathroom and transfer and she has been walking much less    Time  6    Period  Weeks    Status  New        OT Long Term Goals - 05/12/18 1412      OT LONG TERM GOAL #1   Title  Pt will perform updated HEP for RUE functional use with min prompts.    Time   12    Period  Weeks    Status  New    Target Date  08/10/18      OT LONG TERM GOAL #2   Title  Pt will report ability to use LUE as a gross assist/ stabilizer at least 25 % of the time for ADLs.    Time  12    Period  Weeks    Status  New      OT LONG TERM GOAL #3   Title  Pt will demonstrate adequate grasp and RUE functional use to place/ remove several lightweight objects in the dishwasher with RUE with supervision    Time  12    Period  Weeks    Status  New      OT LONG TERM GOAL #4   Title  Pt will demonstrate ability to wipe down kitchen counter in standing using RUE with supervision.    Time  12    Period  Weeks    Status  New              Patient will benefit from skilled therapeutic intervention in order to improve the following deficits and impairments:     Visit Diagnosis: Spastic hemiplegia of right dominant side as late effect of cerebral infarction (HCC)  Muscle weakness (generalized)  Stiffness of right hand, not elsewhere classified  Stiffness of right shoulder, not elsewhere classified  Other lack of coordination    Problem List Patient Active Problem List   Diagnosis Date Noted  . Right spastic hemiplegia (HCC) 10/13/2017  . Acute lower UTI   . Labile blood pressure   . Abnormal urinalysis   . Trauma   . Closed compression fracture of L4 lumbar vertebra   . Neurogenic bladder   . History of hypertension   . Anemia of chronic disease   . Hypoalbuminemia due to protein-calorie malnutrition (HCC)   . Acute bilateral cerebral infarction in a watershed distribution [facility](HCC) 04/05/2017  . Benign essential HTN   . Chronic diastolic heart failure (HCC)   . Right spastic hemiparesis (HCC)   . History of stroke 02/16/2017  . Abnormality of gait following cerebrovascular accident (  CVA) 02/16/2017  . Slow transit constipation   . Acute blood loss anemia   . Adjustment disorder with depressed mood   . Cerebrovascular accident (CVA) (HCC)  02/03/2016  . Dysarthria, post-stroke   . Dysphagia, post-stroke   . Leukocytosis   . Prediabetes   . Right hemiparesis (HCC)   . Cerebral infarction due to unspecified mechanism   . Other secondary hypertension   . Overactive bladder   . Diastolic dysfunction   . Hyperlipidemia   . Essential hypertension   . Essential tremor   . Stroke (cerebrum) (HCC) 02/01/2016  . Facial droop due to stroke 02/01/2016  . Essential and other specified forms of tremor 02/20/2013  . Dysphonia 02/20/2013  . Trigeminal neuralgia 02/20/2013    Senica Crall 06/09/2018, 5:08 PM  [facility] [address] [place], [place], [address] Phone: [phone]   Fax:  [phone]  Name: Kelly [name] MRN: [id number] Date of Birth: 12/10/1927

## 2018-06-09 NOTE — Patient Instructions (Signed)
Bridging    Slowly raise buttocks from floor, keeping stomach tight. Repeat __10__ times per set. Do __1__ sets per session. Do _1__ sessions per day.  [email or web address].exer.us/1097    Sit to Stand: Phase 2    Sitting, squeeze pelvic floor and hold. Lean trunk forward and push up on arms. Stand straight and tall. Then return to sitting as SLOWLY as you can.  Repeat _10__ times. Do __1_ times a day   Straight Leg Raise    Tighten stomach and slowly raise locked right leg _10___ inches from bed. Repeat __10__ times per set. Do __1__ sets per session. Do __1__ sessions per day.    SHOULDER: External Rotation (Band)    Keep elbow next to body. Holding band in left hand, rotate arm away from body. Hold __3_ seconds. Use _____yellow___ band. __10_ reps per set, _2__ sets per day,

## 2018-06-10 ENCOUNTER — Encounter: Payer: Medicare Other | Attending: Physical Medicine & Rehabilitation | Admitting: Physical Medicine & Rehabilitation

## 2018-06-10 ENCOUNTER — Encounter: Payer: Self-pay | Admitting: Physical Medicine & Rehabilitation

## 2018-06-10 ENCOUNTER — Other Ambulatory Visit: Payer: Self-pay

## 2018-06-10 VITALS — BP 130/67 | HR 83 | Ht 60.0 in | Wt 96.6 lb

## 2018-06-10 DIAGNOSIS — G250 Essential tremor: Secondary | ICD-10-CM | POA: Diagnosis not present

## 2018-06-10 DIAGNOSIS — M7061 Trochanteric bursitis, right hip: Secondary | ICD-10-CM | POA: Diagnosis not present

## 2018-06-10 DIAGNOSIS — R269 Unspecified abnormalities of gait and mobility: Secondary | ICD-10-CM

## 2018-06-10 DIAGNOSIS — G8111 Spastic hemiplegia affecting right dominant side: Secondary | ICD-10-CM | POA: Diagnosis not present

## 2018-06-10 MED ORDER — DICLOFENAC SODIUM 1 % TD GEL: 2.0000 g | Freq: Four times a day (QID) | TRANSDERMAL | 1 refills | Status: DC

## 2018-06-10 NOTE — Progress Notes (Signed)
Subjective:    Patient ID: Kelly [name], female    DOB: 04/10/1928, 85 y.o.   MRN: [id number]  HPI Right-handed female with history of essential tremor presents for follow up for bilateral infarcts.  Last clinic visit 04/14/18.  Since that time, she has been in therapies.  Communication exchanged with therapies regarding tone and pain. Husband present who supplements history.  Since that time, she states she has been having pain in her right hip after doing some exercises.  She tried conservative measures with benefit with normal activities, but pain with exercises.  Denies falls.   Pain Inventory Average Pain 0 Pain Right Now 0 My pain is  no pain  In the last 24 hours, has pain interfered with the following? General activity 0 Relation with others 0 Enjoyment of life 0 What TIME of day is your pain at its worst? no pain Sleep (in general) Good  Pain is worse with: no pain Pain improves with: no pain Relief from Meds: no meds  Mobility walk with assistance use a walker how many minutes can you walk? 10 ability to climb steps?  no do you drive?  no  Function retired I need assistance with the following:  dressing, bathing, toileting, meal prep and household duties  Neuro/Psych bladder control problems bowel control problems trouble walking  Prior Studies Any changes since last visit?  no  Physicians involved in your care Any changes since last visit?  no   Family History  Problem Relation Age of Onset  . Arthritis Brother        spine  . Osteoporosis Maternal Grandmother    Social History   Socioeconomic History  . Marital status: Married    Spouse name: [name]  . Number of children: 3  . Years of education: college  . Highest education level: Not on file  Occupational History  . Occupation: real estate    Comment: retired  Engineer, production  . Financial resource strain: Not very hard  . Food insecurity:    Worry: Patient refused    Inability:  Patient refused  . Transportation needs:    Medical: Patient refused    Non-medical: Patient refused  Tobacco Use  . Smoking status: Never Smoker  . Smokeless tobacco: Never Used  Substance and Sexual Activity  . Alcohol use: Yes    Comment: Wine 2 glass daily occasionally  . Drug use: No  . Sexual activity: Not Currently    Partners: Male    Birth control/protection: Post-menopausal  Lifestyle  . Physical activity:    Days per week: Not on file    Minutes per session: Not on file  . Stress: Not on file  Relationships  . Social connections:    Talks on phone: Not on file    Gets together: Not on file    Attends religious service: Not on file    Active member of club or organization: Not on file    Attends meetings of clubs or organizations: Not on file    Relationship status: Not on file  Other Topics Concern  . Not on file  Social History Narrative   Lives at home with her husband.   Right-handed.   No caffeine use.   Past Surgical History:  Procedure Laterality Date  . APPENDECTOMY  1940  . bladder tack    . CATARACT EXTRACTION Bilateral   . HEMORRHOID SURGERY  1960  . HUMERUS FRACTURE SURGERY    . LAPAROSCOPIC HYSTERECTOMY  2006  .  torn rotator cuff    . WRIST FRACTURE SURGERY  2005   seconday shoulder 2001   Past Medical History:  Diagnosis Date  . Acute blood loss anemia   . Adjustment disorder with depressed mood   . CVA (cerebral vascular accident) (HCC) 02/03/2016   Linear infarct within the posterior limb of left internal capsule  . Diastolic CHF (HCC)   . Dysphagia   . Dysphonia 02/20/2013  . Essential and other specified forms of tremor 02/20/2013  . HLD (hyperlipidemia)   . HTN (hypertension)   . OAB (overactive bladder)   . Osteoporosis   . Patient receiving subcutaneous heparin    For DVT prophylaxis 9/17  . Prediabetes   . Psoriasis   . Slow transit constipation   . Stroke (cerebrum) (HCC) 02/01/2016  . Trigeminal neuralgia 02/20/2013    BP 130/67   Pulse 83   Ht 5' (1.524 m)   Wt 96 lb 9.6 oz (43.8 kg)   SpO2 95%   BMI 18.87 kg/m   Opioid Risk Score:   Fall Risk Score:  `1  Depression screen PHQ 2/9  Dep[facility] 06/10/2018 01/13/2018 10/13/2017 12/10/2016 04/24/2016  Decreased Interest 0 0 0 0 0  Down, Depressed, Hopeless 0 0 - 0 0  PHQ - 2 Score 0 0 0 0 0  Altered sleeping - - - - 0  Tired, decreased energy - - - - 0  Change in appetite - - - - 0  Feeling bad or failure about yourself  - - - - 0  Trouble concentrating - - - - 0  Moving slowly or fidgety/restless - - - - 0  Suicidal thoughts - - - - 0  PHQ-9 Score - - - - 0  Some recent data might be hidden    Review of Systems  Constitutional: Negative.   HENT: Negative.   Eyes: Negative.   Respiratory: Negative.   Cardiovascular: Negative.   Gastrointestinal: Positive for constipation.       Incontinence  Endocrine: Negative.   Genitourinary: Positive for difficulty urinating.  Musculoskeletal: Positive for arthralgias and gait problem.  Skin: Negative.   Allergic/Immunologic: Negative.   Neurological: Positive for weakness.  Hematological: Bruises/bleeds easily.  Psychiatric/Behavioral: Negative.   All other systems reviewed and are negative.     Objective:   Physical Exam Constitutional: She appears well-developed. Frail. NAD.  HENT: Normocephalic and atraumatic.  Eyes: EOMI. No discharge..  Cardiovascular: RRR. No JVD. Respiratory: Effort normal and breath sounds normal.  GI: Soft. Bowel sounds are normal.  Musculoskeletal: She exhibits no edema or tenderness.  Gait: Slow cadence with kyphotic posture TTP right greater trochanter Neurological: She is alert and oriented.  HOH Mild dysarthria.  Right facial droop +LUE Resting tremor Motor: RUE: shoulder abduction, elbow flexion/extension 4/5, wrist extension4-/5, hand 4-/5 RLE: 4+/5 hip flexion, knee extension, 2+/5 ankle dorsi/plantar flexion MAS: right elbow flexors  1/4, wrist flexors 1/4, finger flexors 1+/4, thumb flexors 1+/4.  Psychiatric: She has a normal mood and affect. Her behavior is normal Skin: Skin is warm and dry    Assessment & Plan:  85 year old right-handed female with history of essential tremor presents for follow for bilateral infarcts.  1. Late effects bilateral infarct with spastic hemiplegia affecting right dominant side (G81.11).  Will resume Botox injection:   Right Pecs 25 U   Right Biceps 50U   Hold off on injection of wrist/hand due to functional use of tone  Cont therapies  2. Gait  abnormality  Cont HEP  Cont walker for safety  3. Lumbar compression fracture  Improved  Cont conservative care  4. Right trochanteric bursa syndrome  Will order Voltaren gel and ice  Will consider steroid injection if ineffective

## 2018-06-10 NOTE — Therapy (Signed)
[facility] [address] [place], [place], [address] Phone: [phone]   Fax:  [phone]  Physical Therapy Treatment  Patient Details  Name: Kelly [name] MRN: [id number] Date of Birth: 1928/04/25 Referring Provider (PT): [name] Hugh, NP   Encounter Date: 06/09/2018  PT End of Session - 06/09/18 1700    Visit Number  4    Number of Visits  17    Date for PT Re-Evaluation  07/20/18    Authorization Type  Medicare and [facility]    PT Start Time  1450    PT Stop Time  1530    PT Time Calculation (min)  40 min    Activity Tolerance  Patient tolerated treatment well    Behavior During Therapy  [facility] for tasks assessed/performed       Past Medical History:  Diagnosis Date  . Acute blood loss anemia   . Adjustment disorder with depressed mood   . CVA (cerebral vascular accident) (HCC) 02/03/2016   Linear infarct within the posterior limb of left internal capsule  . Diastolic CHF (HCC)   . Dysphagia   . Dysphonia 02/20/2013  . Essential and other specified forms of tremor 02/20/2013  . HLD (hyperlipidemia)   . HTN (hypertension)   . OAB (overactive bladder)   . Osteoporosis   . Patient receiving subcutaneous heparin    For DVT prophylaxis 9/17  . Prediabetes   . Psoriasis   . Slow transit constipation   . Stroke (cerebrum) (HCC) 02/01/2016  . Trigeminal neuralgia 02/20/2013    Past Surgical History:  Procedure Laterality Date  . APPENDECTOMY  1940  . bladder tack    . CATARACT EXTRACTION Bilateral   . HEMORRHOID SURGERY  1960  . HUMERUS FRACTURE SURGERY    . LAPAROSCOPIC HYSTERECTOMY  2006  . torn rotator cuff    . WRIST FRACTURE SURGERY  2005   seconday shoulder 2001    There were no vitals filed for this visit.  Subjective Assessment - 06/09/18 1455    Subjective  Denies pain. Has not been lying on her left side again (due to pain). Husband called MD and he recommended analgesic rub, heat  or ice. She will see Dr. Allena Katz next week.     Patient is accompained by:  Family member   husband [name]    Pertinent History  CVA, HTN, osteoporosis, HLD    Patient Stated Goals  find a way to walk better with RW.     Currently in Pain?  No/denies                       [facility] Adult PT Treatment/Exercise - 06/09/18 1700      Transfers   Transfers  Sit to Stand    Sit to Stand  4: Min guard;With upper extremity assist;With armrests;From chair/3-in-1;From bed    Comments  incr time for anterior scoot and to come to stand; vc for incr ant wt-shift over BOS as rising; 3 reps      Ambulation/Gait   Ambulation/Gait Assistance  4: Min guard    Ambulation Distance (Feet)  150 Feet    Assistive device  Rolling walker    Gait Pattern  Step-to pattern;Step-through pattern;Decreased stride length;Decreased hip/knee flexion - right;Decreased weight shift to right;Trunk flexed;Right genu recurvatum    Gait Comments  tactile cues and vc for upright posture      Exercises   Exercises  Other Exercises  Other Exercises   in supine, attempted bil shoulder external rotation with theraband looped around wrists for scapular adduction, however RUE could not even maintain neutral with LUE pulling on band; PT held band to allow pt to use LUE to "set" scapula and work on improved postural alignment; once understood technique, had pt transition to sitting in chair with red then yellow band tied to right armrest and using LUE to again perform shoulder ER with elbow at her side and scapular adduction; added to HEP      Knee/Hip Exercises: Supine   Straight Leg Raises  AROM;Right;1 set;10 reps    Straight Leg Raises Limitations  completed while pt positioned for upper thoracic and cervical extension with left arm abducted for ant chest stretch             PT Education - 06/10/18 0758    Education Details  addition to HEP; forego clam exercise currently due to right hip pain    Person(s)  Educated  Patient;Spouse    Methods  Explanation;Demonstration;Handout;Tactile cues;Verbal cues    Comprehension  Verbalized understanding;Returned demonstration;Verbal cues required;Tactile cues required       PT Short Term Goals - 06/10/18 1139      PT SHORT TERM GOAL #1   Title  patient to be independent with initial HEP for balance, strength, mobility (Target for all STGs extended to 07/01/18 due to missed 3 weeks)    Time  4    Period  Weeks    Status  New      PT SHORT TERM GOAL #2   Title  patient to demonstrate 5x sit to stand without assist from PT/family with use of armrests in </= 30 seconds    Baseline  35.65 seconds with intermittent Min A from PT    Time  4    Period  Weeks    Status  New      PT SHORT TERM GOAL #3   Title  patient to improve gait velocity by >/= 0.4 ft/sec    Baseline  1.23 ft/sec    Time  4    Period  Weeks    Status  New      PT SHORT TERM GOAL #4   Title  patient to report ability to increase daily walking program to 2x 15 minutes     Baseline  2 x 10 min    Time  4    Period  Weeks    Status  New        PT Long Term Goals - 05/11/18 1619      PT LONG TERM GOAL #1   Title  patient to be independent with advanced HEP    Time  8    Period  Weeks    Status  New    Target Date  07/20/18      PT LONG TERM GOAL #2   Title  patient to improve 6 min walk test to >/= 425 ft with LRAD    Baseline  313 ft with RW    Time  8    Period  Weeks    Status  New    Target Date  07/20/18      PT LONG TERM GOAL #3   Title  patient to demonstrate improved gait velocity by >/= 0.8 ft/sec from baseline    Baseline  1.23 ft/sec    Time  8    Period  Weeks    Status  New  Target Date  07/20/18      PT LONG TERM GOAL #4   Title  patient to demonstrate gait over indoor/outdoor surfaces for >/= 500 feet with LRAD at SUP for safe community ambulation    Time  8    Period  Weeks    Status  New    Target Date  07/20/18            Plan -  06/09/18 1700    Clinical Impression Statement  Session initially focused on gait with pt continuing to have difficulty with upright posture. Remainder of session focused on postural stretches and strengthening postural muscles to improve pt's ability to stand upright (to improve balance and ease of walking). Will continue to work towards goals (of note, STG due date extended due to 3 weeks missed between evaluation and 1st visit due to appt availability).     Rehab Potential  Good    Clinical Impairments Affecting Rehab Potential  chronicity of impairments and advanced age    PT Frequency  2x / week    PT Duration  8 weeks    PT Treatment/Interventions  ADLs/Self Care Home Management;Functional mobility training;Stair training;Gait training;DME Instruction;Therapeutic activities;Therapeutic exercise;Balance training;Neuromuscular re-education;Patient/family education;Passive range of motion;Manual techniques;Taping    PT Next Visit Plan  continue RLE strengthening and gait training as tolerated; postural retraining    PT Home Exercise Plan  bridging, sit to stand, SLR and clam shell exs given on 06-02-18; clam removed 1/16 due to hip pain, added seated lt shdr ER with scap adduction    Consulted and Agree with Plan of Care  Patient       Patient will benefit from skilled therapeutic intervention in order to improve the following deficits and impairments:  Abnormal gait, Decreased activity tolerance, Decreased balance, Decreased mobility, Decreased strength, Decreased endurance, Decreased safety awareness, Difficulty walking  Visit Diagnosis: Muscle weakness (generalized)  Other abnormalities of gait and mobility  Abnormal posture     Problem List Patient Active Problem List   Diagnosis Date Noted  . Right spastic hemiplegia (HCC) 10/13/2017  . Acute lower UTI   . Labile blood pressure   . Abnormal urinalysis   . Trauma   . Closed compression fracture of L4 lumbar vertebra   .  Neurogenic bladder   . History of hypertension   . Anemia of chronic disease   . Hypoalbuminemia due to protein-calorie malnutrition (HCC)   . Acute bilateral cerebral infarction in a watershed distribution [facility](HCC) 04/05/2017  . Benign essential HTN   . Chronic diastolic heart failure (HCC)   . Right spastic hemiparesis (HCC)   . History of stroke 02/16/2017  . Abnormality of gait following cerebrovascular accident (CVA) 02/16/2017  . Slow transit constipation   . Acute blood loss anemia   . Adjustment disorder with depressed mood   . Cerebrovascular accident (CVA) (HCC) 02/03/2016  . Dysarthria, post-stroke   . Dysphagia, post-stroke   . Leukocytosis   . Prediabetes   . Right hemiparesis (HCC)   . Cerebral infarction due to unspecified mechanism   . Other secondary hypertension   . Overactive bladder   . Diastolic dysfunction   . Hyperlipidemia   . Essential hypertension   . Essential tremor   . Stroke (cerebrum) (HCC) 02/01/2016  . Facial droop due to stroke 02/01/2016  . Essential and other specified forms of tremor 02/20/2013  . Dysphonia 02/20/2013  . Trigeminal neuralgia 02/20/2013    Zena AmosLynn P Trachelle Low, PT 06/10/2018, 11:40 AM  [facility] [address] [place], [place], [address] Phone: [phone]   Fax:  [phone]  Name: Kelly [name] MRN: [id number] Date of Birth: 1927-06-13

## 2018-06-10 NOTE — Addendum Note (Signed)
Addended by: Maryla Morrow A on: 06/10/2018 01:54 PM   Modules accepted: Orders

## 2018-06-14 ENCOUNTER — Ambulatory Visit: Payer: Medicare Other | Admitting: Occupational Therapy

## 2018-06-14 ENCOUNTER — Ambulatory Visit: Payer: Medicare Other | Admitting: Physical Therapy

## 2018-06-14 DIAGNOSIS — M6281 Muscle weakness (generalized): Secondary | ICD-10-CM

## 2018-06-14 DIAGNOSIS — I69351 Hemiplegia and hemiparesis following cerebral infarction affecting right dominant side: Secondary | ICD-10-CM

## 2018-06-14 DIAGNOSIS — R2689 Other abnormalities of gait and mobility: Secondary | ICD-10-CM

## 2018-06-14 DIAGNOSIS — R293 Abnormal posture: Secondary | ICD-10-CM | POA: Diagnosis not present

## 2018-06-14 DIAGNOSIS — R278 Other lack of coordination: Secondary | ICD-10-CM | POA: Diagnosis not present

## 2018-06-14 DIAGNOSIS — M25611 Stiffness of right shoulder, not elsewhere classified: Secondary | ICD-10-CM

## 2018-06-14 DIAGNOSIS — M25641 Stiffness of right hand, not elsewhere classified: Secondary | ICD-10-CM | POA: Diagnosis not present

## 2018-06-14 DIAGNOSIS — R2681 Unsteadiness on feet: Secondary | ICD-10-CM

## 2018-06-14 NOTE — Therapy (Signed)
[facility] [address] [place], [place], [address] Phone: [phone]   Fax:  [phone]  Occupational Therapy Treatment  Patient Details  Name: Kelly [name] MRN: [id number] Date of Birth: 07-Aug-1927 Referring Provider (OT): Elder Love   Encounter Date: 06/14/2018  OT End of Session - 06/14/18 1659    Visit Number  6    Number of Visits  25    Date for OT Re-Evaluation  08/09/18    Authorization Type  Medicare    Authorization Time Period  90 days, anticipate d/c after 8 weeks    Authorization - Visit Number  6    Authorization - Number of Visits  10    OT Start Time  1535    OT Stop Time  1615    OT Time Calculation (min)  40 min       Past Medical History:  Diagnosis Date  . Acute blood loss anemia   . Adjustment disorder with depressed mood   . CVA (cerebral vascular accident) (HCC) 02/03/2016   Linear infarct within the posterior limb of left internal capsule  . Diastolic CHF (HCC)   . Dysphagia   . Dysphonia 02/20/2013  . Essential and other specified forms of tremor 02/20/2013  . HLD (hyperlipidemia)   . HTN (hypertension)   . OAB (overactive bladder)   . Osteoporosis   . Patient receiving subcutaneous heparin    For DVT prophylaxis 9/17  . Prediabetes   . Psoriasis   . Slow transit constipation   . Stroke (cerebrum) (HCC) 02/01/2016  . Trigeminal neuralgia 02/20/2013    Past Surgical History:  Procedure Laterality Date  . APPENDECTOMY  1940  . bladder tack    . CATARACT EXTRACTION Bilateral   . HEMORRHOID SURGERY  1960  . HUMERUS FRACTURE SURGERY    . LAPAROSCOPIC HYSTERECTOMY  2006  . torn rotator cuff    . WRIST FRACTURE SURGERY  2005   seconday shoulder 2001    There were no vitals filed for this visit.  Subjective Assessment - 06/14/18 1706    Patient Stated Goals  to stay more mobile, use right hand more    Currently in Pain?  No/denies                Treatment: Supine gentle joint mobs and AA/ROM to RUE, followed by pt performing scapular retraction then shoulder flexion with PVC pipe frame, min v.c/ facilitation. Seated AA/ROM with pt supported with UE ranger then hand supported on cane, min v.c Functional grasp/ release of cylindrical  objects  With min-mod facilitation.              OT Short Term Goals - 05/12/18 1411      OT SHORT TERM GOAL #1   Title  Patient will complete a home exercise program designed to improve range of motion in right arm- with min prompting.      Time  6    Period  Weeks    Status  New    Target Date  06/26/18      OT SHORT TERM GOAL #2   Title  Pt will use RUE as a gross A/ stabilizer 10% of the time for ADLS.    Time  8    Period  Weeks    Status  New      OT SHORT TERM GOAL #3   Title  Pt will demonstrate ability to retrieve a lightweight object at 60* shoulder  flexion.    Baseline  55*    Time  6    Period  Weeks    Status  New      OT SHORT TERM GOAL #4   Title  Pt will demonstrate improved endurance and functional mobility as evidenced wby ambulating to bathroom with supervision 2x per day.    Baseline  Pt is often using w/c to roll into to bathroom and transfer and she has been walking much less    Time  6    Period  Weeks    Status  New        OT Long Term Goals - 05/12/18 1412      OT LONG TERM GOAL #1   Title  Pt will perform updated HEP for RUE functional use with min prompts.    Time  12    Period  Weeks    Status  New    Target Date  08/10/18      OT LONG TERM GOAL #2   Title  Pt will report ability to use LUE as a gross assist/ stabilizer at least 25 % of the time for ADLs.    Time  12    Period  Weeks    Status  New      OT LONG TERM GOAL #3   Title  Pt will demonstrate adequate grasp and RUE functional use to place/ remove several lightweight objects in the dishwasher with RUE with supervision    Time  12    Period  Weeks    Status   New      OT LONG TERM GOAL #4   Title  Pt will demonstrate ability to wipe down kitchen counter in standing using RUE with supervision.    Time  12    Period  Weeks    Status  New            Plan - 06/14/18 1702    Clinical Impression Statement  Pt is progressing towards goals She reports wearing her splint several hours per day and pt states her hand is looser.    Occupational Profile and client history currently impacting functional performance  CVA, HTN, osteoporosis, HLD  Pt reports a decline in her walking  and right UE functional use which impdes ADLs/IADLS. Pt reports she is scheduled for upcoming botox and needs information about how to proceed.    Occupational performance deficits (Please refer to evaluation for details):  ADL's;IADL's;Leisure    Rehab Potential  Fair    Current Impairments/barriers affecting progress:  severity of deficits, length of time since inital onset, spasticity    OT Frequency  2x / week    OT Duration  12 weeks    OT Treatment/Interventions  Self-care/ADL training;Therapeutic exercise;Gait Training;Aquatic Therapy;Moist Heat;Paraffin;Neuromuscular education;Stair Training;Splinting;Patient/family education;Balance training;Therapeutic activities;Functional Mobility Training;Fluidtherapy;Traction;Cryotherapy;Ultrasound;Contrast Bath;DME and/or AE instruction;Manual Therapy;Passive range of motion;Cognitive remediation/compensation    Plan  NMR, progress HEP,     Consulted and Agree with Plan of Care  Patient;Family member/caregiver       Patient will benefit from skilled therapeutic intervention in order to improve the following deficits and impairments:  Abnormal gait, Decreased cognition, Decreased knowledge of use of DME, Impaired flexibility, Decreased coordination, Decreased mobility, Decreased activity tolerance, Decreased endurance, Decreased range of motion, Decreased strength, Impaired tone, Impaired UE functional use, Impaired perceived  functional ability, Difficulty walking, Decreased safety awareness, Decreased knowledge of precautions, Decreased balance  Visit Diagnosis: Muscle weakness (generalized)  Other abnormalities of  gait and mobility  Abnormal posture  Spastic hemiplegia of right dominant side as late effect of cerebral infarction (HCC)  Stiffness of right hand, not elsewhere classified  Stiffness of right shoulder, not elsewhere classified  Other lack of coordination    Problem List Patient Active Problem List   Diagnosis Date Noted  . 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 Right spastic hemiplegia (HCC) 10/13/2017  . Acute lower UTI   . Labile blood pressure   . Abnormal urinalysis   . Trauma   . Closed compression fracture of L4 lumbar vertebra   . Neurogenic bladder   . History of hypertension   . Anemia of chronic disease   . Hypoalbuminemia due to protein-calorie malnutrition (HCC)   . Acute bilateral cerebral infarction in a watershed distribution [facility]) 04/05/2017  . Benign essential HTN   . Chronic diastolic heart failure (HCC)   . Right spastic hemiparesis (HCC)   . History of stroke 02/16/2017  . Abnormality of gait following cerebrovascular accident (CVA) 02/16/2017  . Slow transit constipation   . Acute blood loss anemia   . Adjustment disorder with depressed mood   . Cerebrovascular accident (CVA) (HCC) 02/03/2016  . Dysarthria, post-stroke   . Dysphagia, post-stroke   . Leukocytosis   . Prediabetes   . Right hemiparesis (HCC)   . Cerebral infarction due to unspecified mechanism   . Other secondary hypertension   . Overactive bladder   . Diastolic dysfunction   . Hyperlipidemia   . Essential hypertension   . Essential tremor   . Stroke (cerebrum) (HCC) 02/01/2016  . Facial droop due to stroke 02/01/2016  . Essential and other specified forms of tremor 02/20/2013  . Dysphonia 02/20/2013  . Trigeminal neuralgia 02/20/2013    RINE,KATHRYN 06/14/2018, 5:08 PM  [facility] [address] [place], [place], [address] Phone: [phone]   Fax:  [phone]  Name: KHLOE HUNKELE MRN: [id number] Date of Birth: [birth year]

## 2018-06-15 NOTE — Therapy (Signed)
[facility] [address] [place], [place], [address] Phone: [phone]   Fax:  [phone]  Physical Therapy Treatment  Patient Details  Name: Kelly HumphreysBennie H [name] MRN: [id number] Date of Birth: Dec 19, 1927 Referring Provider (PT): [name] HughVanschaick, Jessica, NP   Encounter Date: 06/14/2018  PT End of Session - 06/15/18 2153    Visit Number  5    Number of Visits  17    Date for PT Re-Evaluation  07/20/18    Authorization Type  Medicare and [facility]    PT Start Time  [phone]    PT Stop Time  1531    PT Time Calculation (min)  43 min       Past Medical History:  Diagnosis Date  . Acute blood loss anemia   . Adjustment disorder with depressed mood   . CVA (cerebral vascular accident) (HCC) 02/03/2016   Linear infarct within the posterior limb of left internal capsule  . Diastolic CHF (HCC)   . Dysphagia   . Dysphonia 02/20/2013  . Essential and other specified forms of tremor 02/20/2013  . HLD (hyperlipidemia)   . HTN (hypertension)   . OAB (overactive bladder)   . Osteoporosis   . Patient receiving subcutaneous heparin    For DVT prophylaxis 9/17  . Prediabetes   . Psoriasis   . Slow transit constipation   . Stroke (cerebrum) (HCC) 02/01/2016  . Trigeminal neuralgia 02/20/2013    Past Surgical History:  Procedure Laterality Date  . APPENDECTOMY  1940  . bladder tack    . CATARACT EXTRACTION Bilateral   . HEMORRHOID SURGERY  1960  . HUMERUS FRACTURE SURGERY    . LAPAROSCOPIC HYSTERECTOMY  2006  . torn rotator cuff    . WRIST FRACTURE SURGERY  2005   seconday shoulder 2001    There were no vitals filed for this visit.  Subjective Assessment - 06/15/18 2150    Subjective  Pt was diagnosed with Rt trochanteric bursa syndrome; pt states Voltaren gel is really helping the pain; did not receive Botox because they were out of supply - is going back in 2 weeks to get injections    Patient is accompained by:  Family  member    Pertinent History  CVA, HTN, osteoporosis, HLD    Patient Stated Goals  find a way to walk better with RW.     Currently in Pain?  No/denies                       [facility] Adult PT Treatment/Exercise - 06/15/18 0001      Transfers   Transfers  Sit to Stand    Sit to Stand  4: Min guard    Number of Reps  Other reps (comment)   3   Comments  verbal cues to lean forward       Ambulation/Gait   Ambulation/Gait  Yes    Ambulation/Gait Assistance  4: Min guard    Ambulation Distance (Feet)  230 Feet    Assistive device  Rolling walker    Gait Pattern  Step-to pattern;Step-through pattern;Decreased stride length;Decreased hip/knee flexion - right;Decreased weight shift to right;Trunk flexed    Ambulation Surface  Level;Indoor    Gait Comments  verbal cues to keep shoulders up to facilitate more erect posture      Knee/Hip Exercises: Aerobic   Nustep  5" level 3 - legs only      Knee/Hip Exercises: Standing  Forward Step Up  Right;1 set;10 reps;Hand Hold: 1;Step Height: 6"   with mod assist     NeuroRe-ed:  Marching inside // bars 10 reps each leg; sidestepping 2 reps 10' x 2 with LUE support Tap ups with each foot to black foam balance beam with CGA with LUE support on // bar          PT Short Term Goals - 06/10/18 1139      PT SHORT TERM GOAL #1   Title  patient to be independent with initial HEP for balance, strength, mobility (Target for all STGs extended to 07/01/18 due to missed 3 weeks)    Time  4    Period  Weeks    Status  New      PT SHORT TERM GOAL #2   Title  patient to demonstrate 5x sit to stand without assist from PT/family with use of armrests in </= 30 seconds    Baseline  35.65 seconds with intermittent Min A from PT    Time  4    Period  Weeks    Status  New      PT SHORT TERM GOAL #3   Title  patient to improve gait velocity by >/= 0.4 ft/sec    Baseline  1.23 ft/sec    Time  4    Period  Weeks    Status  New      PT  SHORT TERM GOAL #4   Title  patient to report ability to increase daily walking program to 2x 15 minutes     Baseline  2 x 10 min    Time  4    Period  Weeks    Status  New        PT Long Term Goals - 05/11/18 1619      PT LONG TERM GOAL #1   Title  patient to be independent with advanced HEP    Time  8    Period  Weeks    Status  New    Target Date  07/20/18      PT LONG TERM GOAL #2   Title  patient to improve 6 min walk test to >/= 425 ft with LRAD    Baseline  313 ft with RW    Time  8    Period  Weeks    Status  New    Target Date  07/20/18      PT LONG TERM GOAL #3   Title  patient to demonstrate improved gait velocity by >/= 0.8 ft/sec from baseline    Baseline  1.23 ft/sec    Time  8    Period  Weeks    Status  New    Target Date  07/20/18      PT LONG TERM GOAL #4   Title  patient to demonstrate gait over indoor/outdoor surfaces for >/= 500 feet with LRAD at SUP for safe community ambulation    Time  8    Period  Weeks    Status  New    Target Date  07/20/18            Plan - 06/15/18 2155    Clinical Impression Statement  Pt able to stand more erect with cues to keep shoulders up; pt continues to have some difficulty with sit to stand, with increased time required for this transfer. Pt requires UE support with all standing activities and balance exercises.  Rehab Potential  Good    Clinical Impairments Affecting Rehab Potential  chronicity of impairments and advanced age    PT Frequency  2x / week    PT Duration  8 weeks    PT Treatment/Interventions  ADLs/Self Care Home Management;Functional mobility training;Stair training;Gait training;DME Instruction;Therapeutic activities;Therapeutic exercise;Balance training;Neuromuscular re-education;Patient/family education;Passive range of motion;Manual techniques;Taping    PT Next Visit Plan  continue RLE strengthening and gait training as tolerated; postural retraining    PT Home Exercise Plan   bridging, sit to stand, SLR and clam shell exs given on 06-02-18; clam removed 1/16 due to hip pain, added seated lt shdr ER with scap adduction    Consulted and Agree with Plan of Care  Patient       Patient will benefit from skilled therapeutic intervention in order to improve the following deficits and impairments:  Abnormal gait, Decreased activity tolerance, Decreased balance, Decreased mobility, Decreased strength, Decreased endurance, Decreased safety awareness, Difficulty walking  Visit Diagnosis: Muscle weakness (generalized)  Other abnormalities of gait and mobility  Unsteadiness on feet     Problem List Patient Active Problem List   Diagnosis Date Noted  . 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 Right spastic hemiplegia (HCC) 10/13/2017  . Acute lower UTI   . Labile blood pressure   . Abnormal urinalysis   . Trauma   . Closed compression fracture of L4 lumbar vertebra   . Neurogenic bladder   . History of hypertension   . Anemia of chronic disease   . Hypoalbuminemia due to protein-calorie malnutrition (HCC)   . Acute bilateral cerebral infarction in a watershed distribution [facility]) 04/05/2017  . Benign essential HTN   . Chronic diastolic heart failure (HCC)   . Right spastic hemiparesis (HCC)   . History of stroke 02/16/2017  . Abnormality of gait following cerebrovascular accident (CVA) 02/16/2017  . Slow transit constipation   . Acute blood loss anemia   . Adjustment disorder with depressed mood   . Cerebrovascular accident (CVA) (HCC) 02/03/2016  . Dysarthria, post-stroke   . Dysphagia, post-stroke   . Leukocytosis   . Prediabetes   . Right hemiparesis (HCC)   . Cerebral infarction due to unspecified mechanism   . Other secondary hypertension   . Overactive bladder   . Diastolic dysfunction   . Hyperlipidemia   . Essential hypertension   . Essential tremor   . Stroke (cerebrum) (HCC) 02/01/2016  . Facial droop due to stroke 02/01/2016  . Essential and  other specified forms of tremor 02/20/2013  . Dysphonia 02/20/2013  . Trigeminal neuralgia 02/20/2013    Kary Kos, PT 06/15/2018, 10:03 PM  [facility] [address] [place], [place], [address] Phone: [phone]   Fax:  [phone]  Name: Kelly [name] MRN: [id number] Date of Birth: 05/20/1928

## 2018-06-16 ENCOUNTER — Ambulatory Visit: Payer: Medicare Other | Admitting: Occupational Therapy

## 2018-06-16 ENCOUNTER — Ambulatory Visit: Payer: Medicare Other | Admitting: Physical Therapy

## 2018-06-16 DIAGNOSIS — M25641 Stiffness of right hand, not elsewhere classified: Secondary | ICD-10-CM | POA: Diagnosis not present

## 2018-06-16 DIAGNOSIS — I69351 Hemiplegia and hemiparesis following cerebral infarction affecting right dominant side: Secondary | ICD-10-CM

## 2018-06-16 DIAGNOSIS — M25611 Stiffness of right shoulder, not elsewhere classified: Secondary | ICD-10-CM | POA: Diagnosis not present

## 2018-06-16 DIAGNOSIS — R2681 Unsteadiness on feet: Secondary | ICD-10-CM

## 2018-06-16 DIAGNOSIS — M6281 Muscle weakness (generalized): Secondary | ICD-10-CM | POA: Diagnosis not present

## 2018-06-16 DIAGNOSIS — R278 Other lack of coordination: Secondary | ICD-10-CM | POA: Diagnosis not present

## 2018-06-16 DIAGNOSIS — R293 Abnormal posture: Secondary | ICD-10-CM | POA: Diagnosis not present

## 2018-06-16 DIAGNOSIS — R2689 Other abnormalities of gait and mobility: Secondary | ICD-10-CM

## 2018-06-16 NOTE — Therapy (Signed)
[facility] [address] [place], [place], [address] Phone: [phone]   Fax:  [phone]  Occupational Therapy Treatment  Patient Details  Name: Kelly HumphreysBennie H Israelson MRN: [id number] Date of Birth: 1927/07/24 Referring Provider (OT): Elder LoveJessica Vanschiack   Encounter Date: 06/16/2018  OT End of Session - 06/16/18 1623    Visit Number  7    Number of Visits  25    Date for OT Re-Evaluation  08/09/18    Authorization Type  Medicare    Authorization Time Period  90 days, anticipate d/c after 8 weeks    Authorization - Visit Number  7    Authorization - Number of Visits  10    OT Start Time  1530    OT Stop Time  1610    OT Time Calculation (min)  40 min    Activity Tolerance  Patient tolerated treatment well    Behavior During Therapy  [facility] for tasks assessed/performed       Past Medical History:  Diagnosis Date  . Acute blood loss anemia   . Adjustment disorder with depressed mood   . CVA (cerebral vascular accident) (HCC) 02/03/2016   Linear infarct within the posterior limb of left internal capsule  . Diastolic CHF (HCC)   . Dysphagia   . Dysphonia 02/20/2013  . Essential and other specified forms of tremor 02/20/2013  . HLD (hyperlipidemia)   . HTN (hypertension)   . OAB (overactive bladder)   . Osteoporosis   . Patient receiving subcutaneous heparin    For DVT prophylaxis 9/17  . Prediabetes   . Psoriasis   . Slow transit constipation   . Stroke (cerebrum) (HCC) 02/01/2016  . Trigeminal neuralgia 02/20/2013    Past Surgical History:  Procedure Laterality Date  . APPENDECTOMY  1940  . bladder tack    . CATARACT EXTRACTION Bilateral   . HEMORRHOID SURGERY  1960  . HUMERUS FRACTURE SURGERY    . LAPAROSCOPIC HYSTERECTOMY  2006  . torn rotator cuff    . WRIST FRACTURE SURGERY  2005   seconday shoulder 2001    There were no vitals filed for this visit.  Subjective Assessment - 06/16/18 1622    Subjective   I like this (re: pool noodle ex)    Patient is accompained by:  Family member   husband   Patient Stated Goals  to stay more mobile, use right hand more    Currently in Pain?  No/denies       Supine: self ROM in shoulder flexion, followed by gentle P/ROM in flexion, circumduction, and elbow extension. Pt performing chest press motion w/ dowel, but limited elbow extension d/t spasticity Seated: AA/ROM BUE's in low range shoulder flexion w/ elbow extension using pool noodle to facilitate RUE motion/elbow ext. Pt liked this ex. Pt also shown way to use SPC for AA/ROM RUE in flexion and abduction  Discussed ways to incorporate RUE as stabalizer/min assist for functional tasks.  Low range open chain reaching to grasp/release small cones w/ min to mod assist for release/finger extension                      OT Short Term Goals - 05/12/18 1411      OT SHORT TERM GOAL #1   Title  Patient will complete a home exercise program designed to improve range of motion in right arm- with min prompting.      Time  6  Period  Weeks    Status  New    Target Date  06/26/18      OT SHORT TERM GOAL #2   Title  Pt will use RUE as a gross A/ stabilizer 10% of the time for ADLS.    Time  8    Period  Weeks    Status  New      OT SHORT TERM GOAL #3   Title  Pt will demonstrate ability to retrieve a lightweight object at 60* shoulder flexion.    Baseline  55*    Time  6    Period  Weeks    Status  New      OT SHORT TERM GOAL #4   Title  Pt will demonstrate improved endurance and functional mobility as evidenced wby ambulating to bathroom with supervision 2x per day.    Baseline  Pt is often using w/c to roll into to bathroom and transfer and she has been walking much less    Time  6    Period  Weeks    Status  New        OT Long Term Goals - 05/12/18 1412      OT LONG TERM GOAL #1   Title  Pt will perform updated HEP for RUE functional use with min prompts.     Time  12    Period  Weeks    Status  New    Target Date  08/10/18      OT LONG TERM GOAL #2   Title  Pt will report ability to use LUE as a gross assist/ stabilizer at least 25 % of the time for ADLs.    Time  12    Period  Weeks    Status  New      OT LONG TERM GOAL #3   Title  Pt will demonstrate adequate grasp and RUE functional use to place/ remove several lightweight objects in the dishwasher with RUE with supervision    Time  12    Period  Weeks    Status  New      OT LONG TERM GOAL #4   Title  Pt will demonstrate ability to wipe down kitchen counter in standing using RUE with supervision.    Time  12    Period  Weeks    Status  New            Plan - 06/16/18 1624    Clinical Impression Statement  Pt progressing gradually towards goals    Occupational Profile and client history currently impacting functional performance  CVA, HTN, osteoporosis, HLD  Pt reports a decline in her walking  and right UE functional use which impdes ADLs/IADLS. Pt reports she is scheduled for upcoming botox and needs information about how to proceed.    Occupational performance deficits (Please refer to evaluation for details):  ADL's;IADL's;Leisure    Rehab Potential  Fair    Current Impairments/barriers affecting progress:  severity of deficits, length of time since inital onset, spasticity    OT Frequency  2x / week    OT Duration  12 weeks    OT Treatment/Interventions  Self-care/ADL training;Therapeutic exercise;Gait Training;Aquatic Therapy;Moist Heat;Paraffin;Neuromuscular education;Stair Training;Splinting;Patient/family education;Balance training;Therapeutic activities;Functional Mobility Training;Fluidtherapy;Traction;Cryotherapy;Ultrasound;Contrast Bath;DME and/or AE instruction;Manual Therapy;Passive range of motion;Cognitive remediation/compensation    Plan  NMR, progress HEP,     Consulted and Agree with Plan of Care  Patient;Family member/caregiver       Patient will benefit  from skilled therapeutic intervention in order to improve the following deficits and impairments:  Abnormal gait, Decreased cognition, Decreased knowledge of use of DME, Impaired flexibility, Decreased coordination, Decreased mobility, Decreased activity tolerance, Decreased endurance, Decreased range of motion, Decreased strength, Impaired tone, Impaired UE functional use, Impaired perceived functional ability, Difficulty walking, Decreased safety awareness, Decreased knowledge of precautions, Decreased balance  Visit Diagnosis: Spastic hemiplegia of right dominant side as late effect of cerebral infarction (HCC)  Stiffness of right hand, not elsewhere classified  Stiffness of right shoulder, not elsewhere classified    Problem List Patient Active Problem List   Diagnosis Date Noted  . 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 Right spastic hemiplegia (HCC) 10/13/2017  . Acute lower UTI   . Labile blood pressure   . Abnormal urinalysis   . Trauma   . Closed compression fracture of L4 lumbar vertebra   . Neurogenic bladder   . History of hypertension   . Anemia of chronic disease   . Hypoalbuminemia due to protein-calorie malnutrition (HCC)   . Acute bilateral cerebral infarction in a watershed distribution [facility]) 04/05/2017  . Benign essential HTN   . Chronic diastolic heart failure (HCC)   . Right spastic hemiparesis (HCC)   . History of stroke 02/16/2017  . Abnormality of gait following cerebrovascular accident (CVA) 02/16/2017  . Slow transit constipation   . Acute blood loss anemia   . Adjustment disorder with depressed mood   . Cerebrovascular accident (CVA) (HCC) 02/03/2016  . Dysarthria, post-stroke   . Dysphagia, post-stroke   . Leukocytosis   . Prediabetes   . Right hemiparesis (HCC)   . Cerebral infarction due to unspecified mechanism   . Other secondary hypertension   . Overactive bladder   . Diastolic dysfunction   . Hyperlipidemia   . Essential hypertension   .  Essential tremor   . Stroke (cerebrum) (HCC) 02/01/2016  . Facial droop due to stroke 02/01/2016  . Essential and other specified forms of tremor 02/20/2013  . Dysphonia 02/20/2013  . Trigeminal neuralgia 02/20/2013    Kelli Churn 06/16/2018, 4:24 PM  [facility] [address] [place], [place], [address] Phone: [phone]   Fax:  [phone]  Name: KRYSTEENA ASSAD MRN: [id number] Date of Birth: 08-Feb-1928

## 2018-06-17 NOTE — Therapy (Signed)
[facility] [address] [place], [place], [address] Phone: [phone]   Fax:  [phone]  Physical Therapy Treatment  Patient Details  Name: Kelly HumphreysBennie H [name] MRN: [id number] Date of Birth: 22-Sep-1927 Referring Provider (PT): [name] HughVanschaick, Jessica, NP   Encounter Date: 06/16/2018  PT End of Session - 06/17/18 1056    Visit Number  6    Number of Visits  17    Date for PT Re-Evaluation  07/20/18    Authorization Type  Medicare and [facility]    PT Start Time  1447    PT Stop Time  1533    PT Time Calculation (min)  46 min       Past Medical History:  Diagnosis Date  . Acute blood loss anemia   . Adjustment disorder with depressed mood   . CVA (cerebral vascular accident) (HCC) 02/03/2016   Linear infarct within the posterior limb of left internal capsule  . Diastolic CHF (HCC)   . Dysphagia   . Dysphonia 02/20/2013  . Essential and other specified forms of tremor 02/20/2013  . HLD (hyperlipidemia)   . HTN (hypertension)   . OAB (overactive bladder)   . Osteoporosis   . Patient receiving subcutaneous heparin    For DVT prophylaxis 9/17  . Prediabetes   . Psoriasis   . Slow transit constipation   . Stroke (cerebrum) (HCC) 02/01/2016  . Trigeminal neuralgia 02/20/2013    Past Surgical History:  Procedure Laterality Date  . APPENDECTOMY  1940  . bladder tack    . CATARACT EXTRACTION Bilateral   . HEMORRHOID SURGERY  1960  . HUMERUS FRACTURE SURGERY    . LAPAROSCOPIC HYSTERECTOMY  2006  . torn rotator cuff    . WRIST FRACTURE SURGERY  2005   seconday shoulder 2001    There were no vitals filed for this visit.  Subjective Assessment - 06/17/18 1051    Subjective  Pt states she is doing well - reports no pain; husband states pt is walking better - says he sees improvement with her mobility since she has returned to therapy    Patient is accompained by:  Family member    Pertinent History  CVA, HTN,  osteoporosis, HLD    Patient Stated Goals  find a way to walk better with RW.     Currently in Pain?  No/denies                       [facility] Adult PT Treatment/Exercise - 06/17/18 0001      Transfers   Transfers  Sit to Stand    Sit to Stand  4: Min guard    Number of Reps  Other reps (comment)   3   Comments  verbal cues to lean forward       Ambulation/Gait   Ambulation/Gait  Yes    Ambulation/Gait Assistance  4: Min guard    Ambulation Distance (Feet)  250 Feet    Assistive device  Rolling walker    Gait Pattern  Step-to pattern;Step-through pattern;Decreased stride length;Decreased hip/knee flexion - right;Decreased weight shift to right;Trunk flexed    Ambulation Surface  Level;Indoor    Gait Comments  verbal cues to keep shoulders up to facilitate more erect posture      High Level Balance   High Level Balance Activities  Side stepping;Backward walking;Other (comment)   marching in place inside // bars 10 reps each leg  Knee/Hip Exercises: Aerobic   Recumbent Bike  Scifit level 2.5 x 5"           Balance Exercises - 06/17/18 1054      Balance Exercises: Standing   Rockerboard  Anterior/posterior;10 reps;UE support   mod assist needed for balance & weight shift   Step Ups  Forward;6 inch;UE support 1   RLE 5 reps x 2 sets   Other Standing Exercises  stepping over black balance beam 5 reps each leg w/UE support on RW with min to mod assist           PT Short Term Goals - 06/10/18 1139      PT SHORT TERM GOAL #1   Title  patient to be independent with initial HEP for balance, strength, mobility (Target for all STGs extended to 07/01/18 due to missed 3 weeks)    Time  4    Period  Weeks    Status  New      PT SHORT TERM GOAL #2   Title  patient to demonstrate 5x sit to stand without assist from PT/family with use of armrests in </= 30 seconds    Baseline  35.65 seconds with intermittent Min A from PT    Time  4    Period  Weeks    Status   New      PT SHORT TERM GOAL #3   Title  patient to improve gait velocity by >/= 0.4 ft/sec    Baseline  1.23 ft/sec    Time  4    Period  Weeks    Status  New      PT SHORT TERM GOAL #4   Title  patient to report ability to increase daily walking program to 2x 15 minutes     Baseline  2 x 10 min    Time  4    Period  Weeks    Status  New        PT Long Term Goals - 05/11/18 1619      PT LONG TERM GOAL #1   Title  patient to be independent with advanced HEP    Time  8    Period  Weeks    Status  New    Target Date  07/20/18      PT LONG TERM GOAL #2   Title  patient to improve 6 min walk test to >/= 425 ft with LRAD    Baseline  313 ft with RW    Time  8    Period  Weeks    Status  New    Target Date  07/20/18      PT LONG TERM GOAL #3   Title  patient to demonstrate improved gait velocity by >/= 0.8 ft/sec from baseline    Baseline  1.23 ft/sec    Time  8    Period  Weeks    Status  New    Target Date  07/20/18      PT LONG TERM GOAL #4   Title  patient to demonstrate gait over indoor/outdoor surfaces for >/= 500 feet with LRAD at SUP for safe community ambulation    Time  8    Period  Weeks    Status  New    Target Date  07/20/18            Plan - 06/17/18 1056    Clinical Impression Statement  Pt's gait speed is increasing with pt  noted to be standing somewhat more erect during ambulation; pt continues to have difficulty with sit to stand transfers.  Pt reported moderate fatigue at end of PT session today, after completion of standing activities for strengthening and balance training.      Rehab Potential  Good    Clinical Impairments Affecting Rehab Potential  chronicity of impairments and advanced age    PT Frequency  2x / week    PT Duration  8 weeks    PT Treatment/Interventions  ADLs/Self Care Home Management;Functional mobility training;Stair training;Gait training;DME Instruction;Therapeutic activities;Therapeutic exercise;Balance  training;Neuromuscular re-education;Patient/family education;Passive range of motion;Manual techniques;Taping    PT Next Visit Plan  continue RLE strengthening and gait training as tolerated; postural retraining    Consulted and Agree with Plan of Care  Patient;Family member/caregiver    Family Member Consulted  spouse       Patient will benefit from skilled therapeutic intervention in order to improve the following deficits and impairments:  Abnormal gait, Decreased activity tolerance, Decreased balance, Decreased mobility, Decreased strength, Decreased endurance, Decreased safety awareness, Difficulty walking  Visit Diagnosis: Other abnormalities of gait and mobility  Unsteadiness on feet     Problem List Patient Active Problem List   Diagnosis Date Noted  . 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 Right spastic hemiplegia (HCC) 10/13/2017  . Acute lower UTI   . Labile blood pressure   . Abnormal urinalysis   . Trauma   . Closed compression fracture of L4 lumbar vertebra   . Neurogenic bladder   . History of hypertension   . Anemia of chronic disease   . Hypoalbuminemia due to protein-calorie malnutrition (HCC)   . Acute bilateral cerebral infarction in a watershed distribution [facility]) 04/05/2017  . Benign essential HTN   . Chronic diastolic heart failure (HCC)   . Right spastic hemiparesis (HCC)   . History of stroke 02/16/2017  . Abnormality of gait following cerebrovascular accident (CVA) 02/16/2017  . Slow transit constipation   . Acute blood loss anemia   . Adjustment disorder with depressed mood   . Cerebrovascular accident (CVA) (HCC) 02/03/2016  . Dysarthria, post-stroke   . Dysphagia, post-stroke   . Leukocytosis   . Prediabetes   . Right hemiparesis (HCC)   . Cerebral infarction due to unspecified mechanism   . Other secondary hypertension   . Overactive bladder   . Diastolic dysfunction   . Hyperlipidemia   . Essential hypertension   . Essential tremor    . Stroke (cerebrum) (HCC) 02/01/2016  . Facial droop due to stroke 02/01/2016  . Essential and other specified forms of tremor 02/20/2013  . Dysphonia 02/20/2013  . Trigeminal neuralgia 02/20/2013    Kary Kos, PT 06/17/2018, 11:01 AM  [facility] [address] [place], [place], [address] Phone: [phone]   Fax:  [phone]  Name: Kelly [name] MRN: [id number] Date of Birth: 1927-11-26

## 2018-06-21 ENCOUNTER — Ambulatory Visit: Payer: Medicare Other | Admitting: Physical Therapy

## 2018-06-21 ENCOUNTER — Ambulatory Visit: Payer: Medicare Other | Admitting: Occupational Therapy

## 2018-06-21 DIAGNOSIS — I69351 Hemiplegia and hemiparesis following cerebral infarction affecting right dominant side: Secondary | ICD-10-CM

## 2018-06-21 DIAGNOSIS — M25641 Stiffness of right hand, not elsewhere classified: Secondary | ICD-10-CM

## 2018-06-21 DIAGNOSIS — M6281 Muscle weakness (generalized): Secondary | ICD-10-CM

## 2018-06-21 DIAGNOSIS — R293 Abnormal posture: Secondary | ICD-10-CM | POA: Diagnosis not present

## 2018-06-21 DIAGNOSIS — R2689 Other abnormalities of gait and mobility: Secondary | ICD-10-CM

## 2018-06-21 DIAGNOSIS — M25611 Stiffness of right shoulder, not elsewhere classified: Secondary | ICD-10-CM

## 2018-06-21 DIAGNOSIS — R2681 Unsteadiness on feet: Secondary | ICD-10-CM

## 2018-06-21 DIAGNOSIS — R278 Other lack of coordination: Secondary | ICD-10-CM | POA: Diagnosis not present

## 2018-06-21 NOTE — Therapy (Signed)
[facility] [address] [place], [place], [address] Phone: [phone]   Fax:  [phone]  Occupational Therapy Treatment  Patient Details  Name: Kelly [name] MRN: [id number] Date of Birth: 1927-12-25 Referring Provider (OT): Elder Love   Encounter Date: 06/21/2018  OT End of Session - 06/21/18 1954    Visit Number  8    Number of Visits  25    Date for OT Re-Evaluation  08/09/18    Authorization Type  Medicare    Authorization Time Period  90 days, anticipate d/c after 8 weeks    Authorization - Visit Number  8    Authorization - Number of Visits  10    OT Start Time  1405    OT Stop Time  1445    OT Time Calculation (min)  40 min    Activity Tolerance  Patient tolerated treatment well    Behavior During Therapy  [facility] for tasks assessed/performed       Past Medical History:  Diagnosis Date  . Acute blood loss anemia   . Adjustment disorder with depressed mood   . CVA (cerebral vascular accident) (HCC) 02/03/2016   Linear infarct within the posterior limb of left internal capsule  . Diastolic CHF (HCC)   . Dysphagia   . Dysphonia 02/20/2013  . Essential and other specified forms of tremor 02/20/2013  . HLD (hyperlipidemia)   . HTN (hypertension)   . OAB (overactive bladder)   . Osteoporosis   . Patient receiving subcutaneous heparin    For DVT prophylaxis 9/17  . Prediabetes   . Psoriasis   . Slow transit constipation   . Stroke (cerebrum) (HCC) 02/01/2016  . Trigeminal neuralgia 02/20/2013    Past Surgical History:  Procedure Laterality Date  . APPENDECTOMY  1940  . bladder tack    . CATARACT EXTRACTION Bilateral   . HEMORRHOID SURGERY  1960  . HUMERUS FRACTURE SURGERY    . LAPAROSCOPIC HYSTERECTOMY  2006  . torn rotator cuff    . WRIST FRACTURE SURGERY  2005   seconday shoulder 2001    There were no vitals filed for this visit.  Subjective Assessment - 06/21/18 1954    Patient  Stated Goals  to stay more mobile, use right hand more    Currently in Pain?  No/denies            Treatment:Supine gentle joint and scapular mobs to right shoulder followed by closed chain shoulder flexion and chest press, min v.c and facilitation. Seated edge of mat weight bearing though RUE edge of mat , followed by gentle P/ROM to digits Weightbearing through bilateral UE's with body on arm movements then arm on body rolling table forwards and backwards mod v.c/ facilitation for upright posture.                 OT Short Term Goals - 05/12/18 1411      OT SHORT TERM GOAL #1   Title  Patient will complete a home exercise program designed to improve range of motion in right arm- with min prompting.      Time  6    Period  Weeks    Status  New    Target Date  06/26/18      OT SHORT TERM GOAL #2   Title  Pt will use RUE as a gross A/ stabilizer 10% of the time for ADLS.    Time  8    Period  Weeks    Status  New      OT SHORT TERM GOAL #3   Title  Pt will demonstrate ability to retrieve a lightweight object at 60* shoulder flexion.    Baseline  55*    Time  6    Period  Weeks    Status  New      OT SHORT TERM GOAL #4   Title  Pt will demonstrate improved endurance and functional mobility as evidenced wby ambulating to bathroom with supervision 2x per day.    Baseline  Pt is often using w/c to roll into to bathroom and transfer and she has been walking much less    Time  6    Period  Weeks    Status  New        OT Long Term Goals - 05/12/18 1412      OT LONG TERM GOAL #1   Title  Pt will perform updated HEP for RUE functional use with min prompts.    Time  12    Period  Weeks    Status  New    Target Date  08/10/18      OT LONG TERM GOAL #2   Title  Pt will report ability to use LUE as a gross assist/ stabilizer at least 25 % of the time for ADLs.    Time  12    Period  Weeks    Status  New      OT LONG TERM GOAL #3   Title  Pt will  demonstrate adequate grasp and RUE functional use to place/ remove several lightweight objects in the dishwasher with RUE with supervision    Time  12    Period  Weeks    Status  New      OT LONG TERM GOAL #4   Title  Pt will demonstrate ability to wipe down kitchen counter in standing using RUE with supervision.    Time  12    Period  Weeks    Status  New            Plan - 06/21/18 1955    Clinical Impression Statement  Pt demonstrates improving RUE functional use after gentle stretching and ROM    Occupational Profile and client history currently impacting functional performance  CVA, HTN, osteoporosis, HLD  Pt reports a decline in her walking  and right UE functional use which impdes ADLs/IADLS. Pt reports she is scheduled for upcoming botox and needs information about how to proceed.    Occupational performance deficits (Please refer to evaluation for details):  ADL's;IADL's;Leisure    Rehab Potential  Fair    Current Impairments/barriers affecting progress:  severity of deficits, length of time since inital onset, spasticity    OT Frequency  2x / week    OT Duration  12 weeks    OT Treatment/Interventions  Self-care/ADL training;Therapeutic exercise;Gait Training;Aquatic Therapy;Moist Heat;Paraffin;Neuromuscular education;Stair Training;Splinting;Patient/family education;Balance training;Therapeutic activities;Functional Mobility Training;Fluidtherapy;Traction;Cryotherapy;Ultrasound;Contrast Bath;DME and/or AE instruction;Manual Therapy;Passive range of motion;Cognitive remediation/compensation    Plan  NMR, RUE functional use    Consulted and Agree with Plan of Care  Patient       Patient will benefit from skilled therapeutic intervention in order to improve the following deficits and impairments:  Abnormal gait, Decreased cognition, Decreased knowledge of use of DME, Impaired flexibility, Decreased coordination, Decreased mobility, Decreased activity tolerance, Decreased  endurance, Decreased range of motion, Decreased strength, Impaired tone, Impaired UE functional use, Impaired perceived functional  ability, Difficulty walking, Decreased safety awareness, Decreased knowledge of precautions, Decreased balance  Visit Diagnosis: Spastic hemiplegia of right dominant side as late effect of cerebral infarction (HCC)  Stiffness of right hand, not elsewhere classified  Stiffness of right shoulder, not elsewhere classified  Muscle weakness (generalized)    Problem List Patient Active Problem List   Diagnosis Date Noted  . 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 Right spastic hemiplegia (HCC) 10/13/2017  . Acute lower UTI   . Labile blood pressure   . Abnormal urinalysis   . Trauma   . Closed compression fracture of L4 lumbar vertebra   . Neurogenic bladder   . History of hypertension   . Anemia of chronic disease   . Hypoalbuminemia due to protein-calorie malnutrition (HCC)   . Acute bilateral cerebral infarction in a watershed distribution [facility](HCC) 04/05/2017  . Benign essential HTN   . Chronic diastolic heart failure (HCC)   . Right spastic hemiparesis (HCC)   . History of stroke 02/16/2017  . Abnormality of gait following cerebrovascular accident (CVA) 02/16/2017  . Slow transit constipation   . Acute blood loss anemia   . Adjustment disorder with depressed mood   . Cerebrovascular accident (CVA) (HCC) 02/03/2016  . Dysarthria, post-stroke   . Dysphagia, post-stroke   . Leukocytosis   . Prediabetes   . Right hemiparesis (HCC)   . Cerebral infarction due to unspecified mechanism   . Other secondary hypertension   . Overactive bladder   . Diastolic dysfunction   . Hyperlipidemia   . Essential hypertension   . Essential tremor   . Stroke (cerebrum) (HCC) 02/01/2016  . Facial droop due to stroke 02/01/2016  . Essential and other specified forms of tremor 02/20/2013  . Dysphonia 02/20/2013  . Trigeminal neuralgia 02/20/2013     , 06/21/2018, 8:05 PM  [facility] [address] [place], [place], [address] Phone: [phone]   Fax:  [phone]  Name: Kelly [name] MRN: [id number] Date of Birth: 09-Dec-1927

## 2018-06-22 NOTE — Therapy (Signed)
[facility] [address] [place], [place], [address] Phone: [phone]   Fax:  [phone]  Physical Therapy Treatment  Patient Details  Name: Kelly [name] MRN: [id number] Date of Birth: 05/22/1928 Referring Provider (PT): [name] HughVanschaick, Jessica, NP   Encounter Date: 06/21/2018  PT End of Session - 06/22/18 2053    Visit Number  7    Number of Visits  17    Date for PT Re-Evaluation  07/20/18    Authorization Type  Med[employer] and [employer] [facility]    PT Start Time  1450    PT Stop Time  1530    PT Time Calculation (min)  40 min       Past Medical History:  Diagnosis Date  . Acute blood loss anemia   . Adjustment disorder with depressed mood   . CVA (cerebral vascular accident) (HCC) 02/03/2016   Linear infarct within the posterior limb of left internal capsule  . Diastolic CHF (HCC)   . Dysphagia   . Dysphonia 02/20/2013  . Essential and other specified forms of tremor 02/20/2013  . HLD (hyperlipidemia)   . HTN (hypertension)   . OAB (overactive bladder)   . Osteoporosis   . Patient receiving subcutaneous heparin    For DVT prophylaxis 9/17  . Prediabetes   . Psoriasis   . Slow transit constipation   . Stroke (cerebrum) (HCC) 02/01/2016  . Trigeminal neuralgia 02/20/2013    Past Surgical History:  Procedure Laterality Date  . APPENDECTOMY  1940  . bladder tack    . CATARACT EXTRACTION Bilateral   . HEMORRHOID SURGERY  1960  . HUMERUS FRACTURE SURGERY    . LAPAROSCOPIC HYSTERECTOMY  2006  . torn rotator cuff    . WRIST FRACTURE SURGERY  2005   seconday shoulder 2001    There were no vitals filed for this visit.  Subjective Assessment - 06/22/18 2051    Subjective  Pt reports no new changes since previous session last week    Pertinent History  CVA, HTN, osteoporosis, HLD    Patient Stated Goals  find a way to walk better with RW.     Currently in Pain?  No/denies                        [facility] Adult PT Treatment/Exercise - 06/22/18 0001      Transfers   Transfers  Sit to Stand    Sit to Stand  4: Min guard    Number of Reps  Other reps (comment)   3   Comments  verbal cues to lean forward      Ambulation/Gait   Ambulation/Gait  Yes    Ambulation/Gait Assistance  4: Min guard    Ambulation Distance (Feet)  350 Feet    Assistive device  Rolling walker    Gait Pattern  Step-to pattern;Step-through pattern;Decreased stride length;Decreased hip/knee flexion - right;Decreased weight shift to right;Trunk flexed    Ambulation Surface  Level;Indoor    Gait Comments  verbal cues to keep shoulders up to facilitate more erect posture      High Level Balance   High Level Balance Activities  Side stepping;Backward walking;Other (comment)   marching in place inside // bars 10 reps each leg     Stepping over and back of balance beam inside // bars - 5 reps each leg - with LUE support on // bar Tap ups to black beam 5 reps each foot  with LUE support with min assist for balance   Pt gait trained with RW at end of session from // bars to check out desk - approx. 100' with SBA      PT Short Term Goals - 06/10/18 1139      PT SHORT TERM GOAL #1   Title  patient to be independent with initial HEP for balance, strength, mobility (Target for all STGs extended to 07/01/18 due to missed 3 weeks)    Time  4    Period  Weeks    Status  New      PT SHORT TERM GOAL #2   Title  patient to demonstrate 5x sit to stand without assist from PT/family with use of armrests in </= 30 seconds    Baseline  35.65 seconds with intermittent Min A from PT    Time  4    Period  Weeks    Status  New      PT SHORT TERM GOAL #3   Title  patient to improve gait velocity by >/= 0.4 ft/sec    Baseline  1.23 ft/sec    Time  4    Period  Weeks    Status  New      PT SHORT TERM GOAL #4   Title  patient to report ability to increase daily walking program to 2x 15  minutes     Baseline  2 x 10 min    Time  4    Period  Weeks    Status  New        PT Long Term Goals - 05/11/18 1619      PT LONG TERM GOAL #1   Title  patient to be independent with advanced HEP    Time  8    Period  Weeks    Status  New    Target Date  07/20/18      PT LONG TERM GOAL #2   Title  patient to improve 6 min walk test to >/= 425 ft with LRAD    Baseline  313 ft with RW    Time  8    Period  Weeks    Status  New    Target Date  07/20/18      PT LONG TERM GOAL #3   Title  patient to demonstrate improved gait velocity by >/= 0.8 ft/sec from baseline    Baseline  1.23 ft/sec    Time  8    Period  Weeks    Status  New    Target Date  07/20/18      PT LONG TERM GOAL #4   Title  patient to demonstrate gait over indoor/outdoor surfaces for >/= 500 feet with LRAD at SUP for safe community ambulation    Time  8    Period  Weeks    Status  New    Target Date  07/20/18            Plan - 06/22/18 2055    Clinical Impression Statement  Pt is progressing towards goals - amb. distance increased today with pt amb. 350' prior to seated rest period.  Pt cont to need cues to stand erect.  Pt very fatigued at end of session - requested not to ride Nustep due to fatigue.    Rehab Potential  Good    Clinical Impairments Affecting Rehab Potential  chronicity of impairments and advanced age    PT Frequency  2x /  week    PT Duration  8 weeks    PT Treatment/Interventions  ADLs/Self Care Home Management;Functional mobility training;Stair training;Gait training;DME Instruction;Therapeutic activities;Therapeutic exercise;Balance training;Neuromuscular re-education;Patient/family education;Passive range of motion;Manual techniques;Taping    PT Next Visit Plan  continue RLE strengthening and gait training as tolerated; postural retraining    PT Home Exercise Plan  bridging, sit to stand, SLR and clam shell exs given on 06-02-18; clam removed 1/16 due to hip pain, added seated lt  shdr ER with scap adduction    Consulted and Agree with Plan of Care  Patient;Family member/caregiver    Family Member Consulted  spouse       Patient will benefit from skilled therapeutic intervention in order to improve the following deficits and impairments:  Abnormal gait, Decreased activity tolerance, Decreased balance, Decreased mobility, Decreased strength, Decreased endurance, Decreased safety awareness, Difficulty walking  Visit Diagnosis: Other abnormalities of gait and mobility  Unsteadiness on feet  Muscle weakness (generalized)     Problem List Patient Active Problem List   Diagnosis Date Noted  . 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 Right spastic hemiplegia (HCC) 10/13/2017  . Acute lower UTI   . Labile blood pressure   . Abnormal urinalysis   . Trauma   . Closed compression fracture of L4 lumbar vertebra   . Neurogenic bladder   . History of hypertension   . Anemia of chronic disease   . Hypoalbuminemia due to protein-calorie malnutrition (HCC)   . Acute bilateral cerebral infarction in a watershed distribution [facility](HCC) 04/05/2017  . Benign essential HTN   . Chronic diastolic heart failure (HCC)   . Right spastic hemiparesis (HCC)   . History of stroke 02/16/2017  . Abnormality of gait following cerebrovascular accident (CVA) 02/16/2017  . Slow transit constipation   . Acute blood loss anemia   . Adjustment disorder with depressed mood   . Cerebrovascular accident (CVA) (HCC) 02/03/2016  . Dysarthria, post-stroke   . Dysphagia, post-stroke   . Leukocytosis   . Prediabetes   . Right hemiparesis (HCC)   . Cerebral infarction due to unspecified mechanism   . Other secondary hypertension   . Overactive bladder   . Diastolic dysfunction   . Hyperlipidemia   . Essential hypertension   . Essential tremor   . Stroke (cerebrum) (HCC) 02/01/2016  . Facial droop due to stroke 02/01/2016  . Essential and other specified forms of tremor 02/20/2013  .  Dysphonia 02/20/2013  . Trigeminal neuralgia 02/20/2013    Kary Kosilday, Elis Sauber Suzanne, PT 06/22/2018, 8:59 PM  [facility] [address] [place], [place], [address] Phone: [phone]   Fax:  [phone]  Name: Kelly HumphreysBennie H [name] MRN: [id number] Date of Birth: 20-Nov-1927

## 2018-06-23 ENCOUNTER — Ambulatory Visit: Payer: Medicare Other | Admitting: Occupational Therapy

## 2018-06-23 ENCOUNTER — Ambulatory Visit: Payer: Medicare Other | Admitting: Physical Therapy

## 2018-06-28 ENCOUNTER — Ambulatory Visit: Payer: Medicare Other | Attending: Adult Health | Admitting: Occupational Therapy

## 2018-06-28 ENCOUNTER — Ambulatory Visit: Payer: Medicare Other | Admitting: Physical Therapy

## 2018-06-28 DIAGNOSIS — R2689 Other abnormalities of gait and mobility: Secondary | ICD-10-CM | POA: Insufficient documentation

## 2018-06-28 DIAGNOSIS — M6281 Muscle weakness (generalized): Secondary | ICD-10-CM | POA: Diagnosis not present

## 2018-06-28 DIAGNOSIS — R278 Other lack of coordination: Secondary | ICD-10-CM | POA: Insufficient documentation

## 2018-06-28 DIAGNOSIS — E559 Vitamin D deficiency, unspecified: Secondary | ICD-10-CM | POA: Diagnosis not present

## 2018-06-28 DIAGNOSIS — R2681 Unsteadiness on feet: Secondary | ICD-10-CM

## 2018-06-28 DIAGNOSIS — I69351 Hemiplegia and hemiparesis following cerebral infarction affecting right dominant side: Secondary | ICD-10-CM | POA: Diagnosis not present

## 2018-06-28 DIAGNOSIS — M810 Age-related osteoporosis without current pathological fracture: Secondary | ICD-10-CM | POA: Diagnosis not present

## 2018-06-28 DIAGNOSIS — K219 Gastro-esophageal reflux disease without esophagitis: Secondary | ICD-10-CM | POA: Diagnosis not present

## 2018-06-28 DIAGNOSIS — Z681 Body mass index (BMI) 19 or less, adult: Secondary | ICD-10-CM | POA: Diagnosis not present

## 2018-06-28 DIAGNOSIS — M25611 Stiffness of right shoulder, not elsewhere classified: Secondary | ICD-10-CM | POA: Insufficient documentation

## 2018-06-28 DIAGNOSIS — N3281 Overactive bladder: Secondary | ICD-10-CM | POA: Diagnosis not present

## 2018-06-28 DIAGNOSIS — M25641 Stiffness of right hand, not elsewhere classified: Secondary | ICD-10-CM | POA: Diagnosis not present

## 2018-06-28 DIAGNOSIS — L408 Other psoriasis: Secondary | ICD-10-CM | POA: Diagnosis not present

## 2018-06-28 DIAGNOSIS — H9193 Unspecified hearing loss, bilateral: Secondary | ICD-10-CM | POA: Diagnosis not present

## 2018-06-28 DIAGNOSIS — R293 Abnormal posture: Secondary | ICD-10-CM | POA: Insufficient documentation

## 2018-06-28 DIAGNOSIS — I6789 Other cerebrovascular disease: Secondary | ICD-10-CM | POA: Diagnosis not present

## 2018-06-28 DIAGNOSIS — E7800 Pure hypercholesterolemia, unspecified: Secondary | ICD-10-CM | POA: Diagnosis not present

## 2018-06-28 DIAGNOSIS — G250 Essential tremor: Secondary | ICD-10-CM | POA: Diagnosis not present

## 2018-06-28 DIAGNOSIS — G8111 Spastic hemiplegia affecting right dominant side: Secondary | ICD-10-CM | POA: Diagnosis not present

## 2018-06-29 ENCOUNTER — Encounter: Payer: Self-pay | Admitting: Physical Therapy

## 2018-06-29 NOTE — Therapy (Signed)
[facility] [address] [place], [place], [address] Phone: [phone]   Fax:  [phone]  Physical Therapy Treatment  Patient Details  Name: Dan HumphreysBennie H Ramakrishnan MRN: [id number] Date of Birth: [birth year] Referring Provider (PT): [name] HughVanschaick, Jessica, NP   Encounter Date: 06/28/2018  PT End of Session - 06/29/18 2113    Visit Number  8    Number of Visits  17    Date for PT Re-Evaluation  07/20/18    Authorization Type  [employer] [employer] [facility]    PT Start Time  1450    PT Stop Time  1534    PT Time Calculation (min)  44 min       Past Medical History:  Diagnosis Date  . Acute blood loss anemia   . Adjustment disorder with depressed mood   . CVA (cerebral vascular accident) (HCC) 02/03/2016   Linear infarct within the posterior limb of left internal capsule  . Diastolic CHF (HCC)   . Dysphagia   . Dysphonia 02/20/2013  . Essential and other specified forms of tremor 02/20/2013  . HLD (hyperlipidemia)   . HTN (hypertension)   . OAB (overactive bladder)   . Osteoporosis   . Patient receiving subcutaneous heparin    For DVT prophylaxis 9/17  . Prediabetes   . Psoriasis   . Slow transit constipation   . Stroke (cerebrum) (HCC) 02/01/2016  . Trigeminal neuralgia 02/20/2013    Past Surgical History:  Procedure Laterality Date  . APPENDECTOMY  1940  . bladder tack    . CATARACT EXTRACTION Bilateral   . HEMORRHOID SURGERY  1960  . HUMERUS FRACTURE SURGERY    . LAPAROSCOPIC HYSTERECTOMY  2006  . torn rotator cuff    . WRIST FRACTURE SURGERY  2005   seconday shoulder 2001    There were no vitals filed for this visit.  Subjective Assessment - 06/29/18 2108    Subjective  Pt states she was sick last week - is feeling much better today    Patient is accompained by:  Family member    Pertinent History  CVA, HTN, osteoporosis, HLD    Patient Stated Goals  find a way to walk better with RW.     Currently in Pain?   No/denies                       [facility] Adult PT Treatment/Exercise - 06/29/18 0001      Transfers   Transfers  Sit to Stand    Sit to Stand  4: Min assist;3: Mod assist   mod assist from mat in treatment room   Number of Reps  Other reps (comment)   3   Comments  verbal cues to lean forward & to scoot to edge of chair       Ambulation/Gait   Ambulation/Gait  Yes    Ambulation/Gait Assistance  4: Min guard    Ambulation Distance (Feet)  200 Feet    Assistive device  Rolling walker    Gait Pattern  Step-to pattern;Step-through pattern;Decreased stride length;Decreased hip/knee flexion - right;Decreased weight shift to right;Trunk flexed    Ambulation Surface  Level;Indoor    Gait Comments  verbal cues to keep shoulders up to facilitate more erect posture      Knee/Hip Exercises: Aerobic   Nustep  5" level 3 - legs only          Balance Exercises - 06/29/18 2111  Balance Exercises: Standing   Sidestepping  2 reps   inside // bars with LUE support   Step Over Hurdles / Cones  attempted stepping over black balance beam but pt had much difficulty - changed to stepping over theraband on floor; mod assist to maintain balance     Other Standing Exercises  tap ups to 4" step with mod assist          PT Short Term Goals - 06/10/18 1139      PT SHORT TERM GOAL #1   Title  patient to be independent with initial HEP for balance, strength, mobility (Target for all STGs extended to 07/01/18 due to missed 3 weeks)    Time  4    Period  Weeks    Status  New      PT SHORT TERM GOAL #2   Title  patient to demonstrate 5x sit to stand without assist from PT/family with use of armrests in </= 30 seconds    Baseline  35.65 seconds with intermittent Min A from PT    Time  4    Period  Weeks    Status  New      PT SHORT TERM GOAL #3   Title  patient to improve gait velocity by >/= 0.4 ft/sec    Baseline  1.23 ft/sec    Time  4    Period  Weeks    Status  New       PT SHORT TERM GOAL #4   Title  patient to report ability to increase daily walking program to 2x 15 minutes     Baseline  2 x 10 min    Time  4    Period  Weeks    Status  New        PT Long Term Goals - 05/11/18 1619      PT LONG TERM GOAL #1   Title  patient to be independent with advanced HEP    Time  8    Period  Weeks    Status  New    Target Date  07/20/18      PT LONG TERM GOAL #2   Title  patient to improve 6 min walk test to >/= 425 ft with LRAD    Baseline  313 ft with RW    Time  8    Period  Weeks    Status  New    Target Date  07/20/18      PT LONG TERM GOAL #3   Title  patient to demonstrate improved gait velocity by >/= 0.8 ft/sec from baseline    Baseline  1.23 ft/sec    Time  8    Period  Weeks    Status  New    Target Date  07/20/18      PT LONG TERM GOAL #4   Title  patient to demonstrate gait over indoor/outdoor surfaces for >/= 500 feet with LRAD at SUP for safe community ambulation    Time  8    Period  Weeks    Status  New    Target Date  07/20/18            Plan - 06/29/18 2114    Clinical Impression Statement  Pt had much difficulty with stepping over/back of balance beam with each leg; pt continues to stand with increased trunk and head flexion with decreased ability to stand upright and erect.  Pt had more activity tolerance  in today's session than in previous PT session.                                                                                                                                                                        Rehab Potential  Good    Clinical Impairments Affecting Rehab Potential  chronicity of impairments and advanced age    PT Frequency  2x / week    PT Duration  8 weeks    PT Treatment/Interventions  ADLs/Self Care Home Management;Functional mobility training;Stair training;Gait training;DME Instruction;Therapeutic activities;Therapeutic exercise;Balance training;Neuromuscular  re-education;Patient/family education;Passive range of motion;Manual techniques;Taping    PT Next Visit Plan  please begin checking STG's - date was extended due to delay in pt beginning PT; cont balance and gait training    PT Home Exercise Plan  bridging, sit to stand, SLR and clam shell exs given on 06-02-18; clam removed 1/16 due to hip pain, added seated lt shdr ER with scap adduction    Consulted and Agree with Plan of Care  Patient       Patient will benefit from skilled therapeutic intervention in order to improve the following deficits and impairments:  Abnormal gait, Decreased activity tolerance, Decreased balance, Decreased mobility, Decreased strength, Decreased endurance, Decreased safety awareness, Difficulty walking  Visit Diagnosis: Muscle weakness (generalized)  Other abnormalities of gait and mobility  Unsteadiness on feet     Problem List Patient Active Problem List   Diagnosis Date Noted  . 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 Right spastic hemiplegia (HCC) 10/13/2017  . Acute lower UTI   . Labile blood pressure   . Abnormal urinalysis   . Trauma   . Closed compression fracture of L4 lumbar vertebra   . Neurogenic bladder   . History of hypertension   . Anemia of chronic disease   . Hypoalbuminemia due to protein-calorie malnutrition (HCC)   . Acute bilateral cerebral infarction in a watershed distribution [facility](HCC) 04/05/2017  . Benign essential HTN   . Chronic diastolic heart failure (HCC)   . Right spastic hemiparesis (HCC)   . History of stroke 02/16/2017  . Abnormality of gait following cerebrovascular accident (CVA) 02/16/2017  . Slow transit constipation   . Acute blood loss anemia   . Adjustment disorder with depressed mood   . Cerebrovascular accident (CVA) (HCC) 02/03/2016  . Dysarthria, post-stroke   . Dysphagia, post-stroke   . Leukocytosis   . Prediabetes   . Right hemiparesis (HCC)   . Cerebral infarction due to unspecified mechanism   .  Other secondary hypertension   . Overactive bladder   . Diastolic dysfunction   . Hyperlipidemia   . Essential hypertension   . Essential tremor   . Stroke (cerebrum) (  HCC) 02/01/2016  . Facial droop due to stroke 02/01/2016  . Essential and other specified forms of tremor 02/20/2013  . Dysphonia 02/20/2013  . Trigeminal neuralgia 02/20/2013    DildayDonavan Burnet, PT 06/29/2018, 9:22 PM  [facility] [address] [place], [place], [address] Phone: [phone]   Fax:  [phone]  Name: TAMEKA MCFEELEY MRN: [id number] Date of Birth: 05-27-1927

## 2018-06-29 NOTE — Therapy (Signed)
[place] [address] [place], [place], [address] Phone: [phone]   Fax:  [phone]  Occupational Therapy Treatment  Patient Details  Name: Kelly [name] MRN: [id number] Date of Birth: 1927/12/20 Referring Provider (OT): [name]   Encounter Date: 06/28/2018  OT End of Session - 06/28/18 1425    Visit Number  9    Number of Visits  25    Date for OT Re-Evaluation  08/09/18    Authorization Type  Medicare    Authorization Time Period  90 days, anticipate d/c after 8 weeks    Authorization - Visit Number  9    Authorization - Number of Visits  10    OT Start Time  1404    OT Stop Time  1445    OT Time Calculation (min)  41 min    Activity Tolerance  Patient tolerated treatment well    Behavior During Therapy  [facility] for tasks assessed/performed       Past Medical History:  Diagnosis Date  . Acute blood loss anemia   . Adjustment disorder with depressed mood   . CVA (cerebral vascular accident) ([place]) 02/03/2016   Linear infarct within the posterior limb of left internal capsule  . Diastolic CHF ([place])   . Dysphagia   . Dysphonia 02/20/2013  . Essential and other specified forms of tremor 02/20/2013  . HLD (hyperlipidemia)   . HTN (hypertension)   . OAB (overactive bladder)   . Osteoporosis   . Patient receiving subcutaneous heparin    For DVT prophylaxis 9/17  . Prediabetes   . Psoriasis   . Slow transit constipation   . Stroke (cerebrum) ([place]) 02/01/2016  . Trigeminal neuralgia 02/20/2013    Past Surgical History:  Procedure Laterality Date  . APPENDECTOMY  1940  . bladder tack    . CATARACT EXTRACTION Bilateral   . HEMORRHOID SURGERY  1960  . HUMERUS FRACTURE SURGERY    . LAPAROSCOPIC HYSTERECTOMY  2006  . torn rotator cuff    . WRIST FRACTURE SURGERY  2005   seconday shoulder 2001    There were no vitals filed for this visit.  Subjective Assessment - 06/29/18 0851    Subjective   Pt reports she is getting botox on Friday    Patient Stated Goals  to stay more mobile, use right hand more    Currently in Pain?  No/denies         Treatment: Gentle passive stretch to fingers hand and wrist passively followed by weight bearing ege of mat then through elbows on table to with body on arm movements then AA/ROM shoulder flexion rolling table forwards and back wards, mod facilitation. Closed chain chest press, low range shoulder flexion with min v.c/ facilitation Low range grasp/ release of cylindrical objects  To place / remove from pegboard, min -mod facilitation for manipulating pegs and for release.                     OT Short Term Goals - 06/29/18 0846      OT SHORT TERM GOAL #1   Title  Patient will complete a home exercise program designed to improve range of motion in right arm- with min prompting.      Status  On-going      OT SHORT TERM GOAL #2   Title  Pt will use RUE as a gross A/ stabilizer 10% of the time for ADLS.    Status  On-going   5%     OT SHORT TERM GOAL #3   Title  Pt will demonstrate ability to retrieve a lightweight object at 60* shoulder flexion.    Status  Partially Met   shoulder flexion to 60* however difficulty with grasp / release of items     OT SHORT TERM GOAL #4   Title  Pt will demonstrate improved endurance and functional mobility as evidenced wby ambulating to bathroom with supervision 2x per day.    Status  Achieved        OT Long Term Goals - 05/12/18 1412      OT LONG TERM GOAL #1   Title  Pt will perform updated HEP for RUE functional use with min prompts.    Time  12    Period  Weeks    Status  New    Target Date  08/10/18      OT LONG TERM GOAL #2   Title  Pt will report ability to use LUE as a gross assist/ stabilizer at least 25 % of the time for ADLs.    Time  12    Period  Weeks    Status  New      OT LONG TERM GOAL #3   Title  Pt will demonstrate adequate grasp and RUE  functional use to place/ remove several lightweight objects in the dishwasher with RUE with supervision    Time  12    Period  Weeks    Status  New      OT LONG TERM GOAL #4   Title  Pt will demonstrate ability to wipe down kitchen counter in standing using RUE with supervision.    Time  12    Period  Weeks    Status  New            Plan - 06/29/18 6213    Clinical Impression Statement  Pt demonstrates improving RUE functional use after gentle stretching and ROM. Pt is scheduled for botox on Friday of this week. Pt reports she prefers to gret botox to +    Occupational Profile and client history currently impacting functional performance  CVA, HTN, osteoporosis, HLD  Pt reports a decline in her walking  and right UE functional use which impdes ADLs/IADLS. Pt reports she is scheduled for upcoming botox and needs information about how to proceed.    Occupational performance deficits (Please refer to evaluation for details):  ADL's;IADL's;Leisure    Rehab Potential  Fair    Current Impairments/barriers affecting progress:  severity of deficits, length of time since inital onset, spasticity    OT Frequency  2x / week    OT Duration  12 weeks    OT Treatment/Interventions  Self-care/ADL training;Therapeutic exercise;Gait Training;Aquatic Therapy;Moist Heat;Paraffin;Neuromuscular education;Stair Training;Splinting;Patient/family education;Balance training;Therapeutic activities;Functional Mobility Training;Fluidtherapy;Traction;Cryotherapy;Ultrasound;Contrast Bath;DME and/or AE instruction;Manual Therapy;Passive range of motion;Cognitive remediation/compensation    Plan  NMR, RUE functional use    Consulted and Agree with Plan of Care  Patient       Patient will benefit from skilled therapeutic intervention in order to improve the following deficits and impairments:  Abnormal gait, Decreased cognition, Decreased knowledge of use of DME, Impaired flexibility, Decreased coordination,  Decreased mobility, Decreased activity tolerance, Decreased endurance, Decreased range of motion, Decreased strength, Impaired tone, Impaired UE functional use, Impaired perceived functional ability, Difficulty walking, Decreased safety awareness, Decreased knowledge of precautions, Decreased balance  Visit Diagnosis: Muscle weakness (generalized)  Spastic hemiplegia of right dominant side  as late effect of cerebral infarction ([place])  Stiffness of right hand, not elsewhere classified  Stiffness of right shoulder, not elsewhere classified  Abnormal posture  Other lack of coordination  Other abnormalities of gait and mobility    Problem List Patient Active Problem List   Diagnosis Date Noted  . 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 Right spastic hemiplegia ([place]) 10/13/2017  . Acute lower UTI   . Labile blood pressure   . Abnormal urinalysis   . Trauma   . Closed compression fracture of L4 lumbar vertebra   . Neurogenic bladder   . History of hypertension   . Anemia of chronic disease   . Hypoalbuminemia due to protein-calorie malnutrition ([place])   . Acute bilateral cerebral infarction in a watershed distribution [facility]) 04/05/2017  . Benign essential HTN   . Chronic diastolic heart failure ([place])   . Right spastic hemiparesis ([place])   . History of stroke 02/16/2017  . Abnormality of gait following cerebrovascular accident (CVA) 02/16/2017  . Slow transit constipation   . Acute blood loss anemia   . Adjustment disorder with depressed mood   . Cerebrovascular accident (CVA) ([place]) 02/03/2016  . Dysarthria, post-stroke   . Dysphagia, post-stroke   . Leukocytosis   . Prediabetes   . Right hemiparesis ([place])   . Cerebral infarction due to unspecified mechanism   . Other secondary hypertension   . Overactive bladder   . Diastolic dysfunction   . Hyperlipidemia   . Essential hypertension   . Essential tremor   . Stroke (cerebrum) ([place]) 02/01/2016  . Facial droop due to stroke  02/01/2016  . Essential and other specified forms of tremor 02/20/2013  . Dysphonia 02/20/2013  . Trigeminal neuralgia 02/20/2013    , 06/29/2018, 8:51 AM  [facility] [place] [address] [place], [place], [address] Phone: [phone]   Fax:  [phone]  Name: Kelly [name] MRN: [id number] Date of Birth: 1928-04-02

## 2018-06-30 ENCOUNTER — Ambulatory Visit: Payer: Medicare Other | Admitting: Occupational Therapy

## 2018-06-30 ENCOUNTER — Ambulatory Visit: Payer: Medicare Other | Admitting: Physical Therapy

## 2018-07-01 ENCOUNTER — Other Ambulatory Visit: Payer: Self-pay

## 2018-07-01 ENCOUNTER — Encounter: Payer: Self-pay | Admitting: Physical Medicine & Rehabilitation

## 2018-07-01 ENCOUNTER — Encounter: Payer: Medicare Other | Attending: Physical Medicine & Rehabilitation | Admitting: Physical Medicine & Rehabilitation

## 2018-07-01 VITALS — BP 141/75 | HR 79 | Ht 60.0 in | Wt 92.4 lb

## 2018-07-01 DIAGNOSIS — G8111 Spastic hemiplegia affecting right dominant side: Secondary | ICD-10-CM | POA: Diagnosis not present

## 2018-07-01 NOTE — Progress Notes (Signed)
Mrs Kelly [name]  Was entering the building @ [address] front entryway.  She was ambulating with her walker and her husband was in attendance.  The electric door reportedly closed and knocked her down.  Her husband reports it bumped him too.  We were called to the lobby to assist her. She was arriving for an appointment with Dr Allena KatzPatel.  She was already in her wheelchair and assisted by another gentleman who helped her on onto the lobby.  She was shaken up but did not feel she was injured.  Brayton ElPhillip Pough and myself assisted her into an exam room and her VS checked and skin observed for injury.  She reports she fell back onto her buttocks and it was hurting her. She has bruising on her lower buttocks but uncertain if this is new. A safety portal was filed and Dr Allena KatzPatel notified.

## 2018-07-01 NOTE — Progress Notes (Signed)
Botox: Procedure Note Patient Name: Kelly [name] DOB: [birth year] MRN: [id number]  Date: 01/13/18  Procedure: Botulinum toxin administration Guidance: EMG Diagnosis: G81.11 Attending: Maryla Morrow, MD    Trade name: Botox (onabotulinumtoxinA)  Informed consent: Risks, benefits & options of the procedure are explained to the patient (and/or family). The patient elects to proceed with procedure. Risks include but are not limited to weakness, respiratory distress, dry mouth, ptosis, antibody formation, worsening of some areas of function. Benefits include decreased abnormal muscle tone, improved hygiene and positioning, decreased skin breakdown and, in some cases, decreased pain. Options include conservative management with oral antispasticity agents, phenol chemodenervation of nerve or at motor nerve branches. More invasive options include intrathecal balcofen adminstration for appropriate candidates. Surgical options may include tendon lengthening or transposition or, rarely, dorsal rhizotomy.   History/Physical Examination: 85 y.o. spastic hemiplegia affecting right dominant side after left internal capsule CVA.  mAS  Right elbow flexors 1/4   Right wrist flexors 1/4   Right finger flexors 1+/4 Previous Treatments: Failed Baclofen.  Therapy/Range of motion Indication for guidance: Target active muscules  Procedure: Botulinum toxin was mixed with preservative free saline with a dilution of 1cc to 100 units. Targeted limb and muscles were identified. The skin was prepped with alcohol swabs and placement of needle tip in targeted muscle was confirmed using appropriate guidance. Prior to injection, positioning of needle tip outside of blood vessel was determined by pulling back on syringe plunger.  MUSCLE UNITS: Right Med Ped: 25 Right Biceps 50U  Total units used: 75 Total discarded due to unavoidable waste: 25 units  Complications: None Plan: Follow up in 6 weeks  Kelly [name] Kelly [name]  1:57 PM

## 2018-07-05 ENCOUNTER — Ambulatory Visit: Payer: Medicare Other | Admitting: Occupational Therapy

## 2018-07-05 ENCOUNTER — Ambulatory Visit: Payer: Medicare Other | Admitting: Physical Therapy

## 2018-07-05 DIAGNOSIS — M6281 Muscle weakness (generalized): Secondary | ICD-10-CM | POA: Diagnosis not present

## 2018-07-05 DIAGNOSIS — M25641 Stiffness of right hand, not elsewhere classified: Secondary | ICD-10-CM | POA: Diagnosis not present

## 2018-07-05 DIAGNOSIS — R2681 Unsteadiness on feet: Secondary | ICD-10-CM

## 2018-07-05 DIAGNOSIS — R293 Abnormal posture: Secondary | ICD-10-CM | POA: Diagnosis not present

## 2018-07-05 DIAGNOSIS — R2689 Other abnormalities of gait and mobility: Secondary | ICD-10-CM

## 2018-07-05 DIAGNOSIS — I69351 Hemiplegia and hemiparesis following cerebral infarction affecting right dominant side: Secondary | ICD-10-CM | POA: Diagnosis not present

## 2018-07-05 DIAGNOSIS — R278 Other lack of coordination: Secondary | ICD-10-CM | POA: Diagnosis not present

## 2018-07-05 DIAGNOSIS — M25611 Stiffness of right shoulder, not elsewhere classified: Secondary | ICD-10-CM | POA: Diagnosis not present

## 2018-07-06 NOTE — Therapy (Signed)
[place] [address] [place], [place], [address] Phone: [phone]   Fax:  [phone]  Physical Therapy Treatment  Patient Details  Name: Kelly [name] MRN: [id number] Date of Birth: 03-Feb-1928 Referring Provider (PT): Venancio Poisson, NP   Encounter Date: 07/05/2018  PT End of Session - 07/06/18 1221    Visit Number  9    Number of Visits  17    Date for PT Re-Evaluation  07/20/18    Authorization Type  Medicare and [facility]    PT Start Time  1450    PT Stop Time  1530    PT Time Calculation (min)  40 min       Past Medical History:  Diagnosis Date  . Acute blood loss anemia   . Adjustment disorder with depressed mood   . CVA (cerebral vascular accident) ([place]) 02/03/2016   Linear infarct within the posterior limb of left internal capsule  . Diastolic CHF ([place])   . Dysphagia   . Dysphonia 02/20/2013  . Essential and other specified forms of tremor 02/20/2013  . HLD (hyperlipidemia)   . HTN (hypertension)   . OAB (overactive bladder)   . Osteoporosis   . Patient receiving subcutaneous heparin    For DVT prophylaxis 9/17  . Prediabetes   . Psoriasis   . Slow transit constipation   . Stroke (cerebrum) ([place]) 02/01/2016  . Trigeminal neuralgia 02/20/2013    Past Surgical History:  Procedure Laterality Date  . APPENDECTOMY  1940  . bladder tack    . CATARACT EXTRACTION Bilateral   . HEMORRHOID SURGERY  1960  . HUMERUS FRACTURE SURGERY    . LAPAROSCOPIC HYSTERECTOMY  2006  . torn rotator cuff    . WRIST FRACTURE SURGERY  2005   seconday shoulder 2001    There were no vitals filed for this visit.  Subjective Assessment - 07/06/18 1207    Subjective  Pt received Botox injection last Friday; states as she was going in to Dr. Serita Grit office for the Botox injection appt.,  she was knocked down by the automatic door which closed on her; says she has bruises but did not get seriously hurt     Patient is accompained by:  Family member    Pertinent History  CVA, HTN, osteoporosis, HLD    Patient Stated Goals  find a way to walk better with RW.     Currently in Pain?  No/denies   pt reported some left hip soreness due to incident of falling due to automatic door closing on her on 07-01-18                      [facility] Adult PT Treatment/Exercise - 07/06/18 0001      Transfers   Transfers  Sit to Stand    Number of Reps  Other reps (comment)   3   Comments  min assist with cues to slide forward needed; pt braces legs against mat table for stabilization      Ambulation/Gait   Ambulation/Gait  Yes    Ambulation/Gait Assistance  4: Min guard    Ambulation Distance (Feet)  230 Feet    Assistive device  Rolling walker    Gait Pattern  Step-to pattern;Step-through pattern;Decreased stride length;Decreased hip/knee flexion - right;Decreased weight shift to right;Trunk flexed    Ambulation Surface  Level;Indoor    Gait velocity  23.75 secs = 1.38 ft/sec with RW    Stairs  Yes    Stairs Assistance  4: Min guard    Stair Management Technique  One rail Left;Forwards;Step to pattern    Number of Stairs  4    Height of Stairs  6    Gait Comments  cues to increase step length      Knee/Hip Exercises: Standing   Forward Step Up  Right;1 set;10 reps;Hand Hold: 1   with min assist      Self Care; discussed STG's and progress; discussed with pt and husband that one STG was for pt to amb. 15" twice/day at home With use of RW         PT Short Term Goals - 07/05/18 1531      PT SHORT TERM GOAL #1   Title  patient to be independent with initial HEP for balance, strength, mobility (Target for all STGs extended to 07/01/18 due to missed 3 weeks)    Baseline  met 07-05-18    Status  Achieved      PT SHORT TERM GOAL #2   Title  patient to demonstrate 5x sit to stand without assist from PT/family with use of armrests in </= 30 seconds    Baseline  35.65 seconds with  intermittent Min A from PT; pt needs min assist with this transfer and braces legs against mat table for stabilization upon initial sit to stand    Status  Not Met      PT SHORT TERM GOAL #3   Title  patient to improve gait velocity by >/= 0.4 ft/sec    Baseline  1.23 ft/sec;                        23.75 secs = 1.38 ft/sec with RW    Status  Not Met      PT SHORT TERM GOAL #4   Title  patient to report ability to increase daily walking program to 2x 15 minutes     Baseline  progressing - pt reports walking 10" and has walked 15" x 1     Status  Partially Met      PT SHORT TERM GOAL #5   Title  Pt will amb. with RW 350' with SBA on flat, even surface.    Status  Achieved        PT Long Term Goals - 05/11/18 1619      PT LONG TERM GOAL #1   Title  patient to be independent with advanced HEP    Time  8    Period  Weeks    Status  New    Target Date  07/20/18      PT LONG TERM GOAL #2   Title  patient to improve 6 min walk test to >/= 425 ft with LRAD    Baseline  313 ft with RW    Time  8    Period  Weeks    Status  New    Target Date  07/20/18      PT LONG TERM GOAL #3   Title  patient to demonstrate improved gait velocity by >/= 0.8 ft/sec from baseline    Baseline  1.23 ft/sec    Time  8    Period  Weeks    Status  New    Target Date  07/20/18      PT LONG TERM GOAL #4   Title  patient to demonstrate gait over indoor/outdoor surfaces for >/= 500 feet  with LRAD at [facility] for safe community ambulation    Time  8    Period  Weeks    Status  New    Target Date  07/20/18            Plan - 07/06/18 1222    Clinical Impression Statement  Pt has met STG's # 1 and 5:  STG's # 2 & 3 not met as pt continues to need min assist with sit to stand transfers and braces legs against mat table for stabilization upon initial sit to stand transfer.  Gait velocity has only minimally increased from 1.23 ft/sec to 1.38 ft/sec with use of RW.  STG #4 is partially met as pt is  beginning to amb. for at least 15" with RW at home but this has only been done once per pt report.                                                                                                  Rehab Potential  Good    Clinical Impairments Affecting Rehab Potential  chronicity of impairments and advanced age    PT Frequency  2x / week    PT Duration  8 weeks    PT Treatment/Interventions  ADLs/Self Care Home Management;Functional mobility training;Stair training;Gait training;DME Instruction;Therapeutic activities;Therapeutic exercise;Balance training;Neuromuscular re-education;Patient/family education;Passive range of motion;Manual techniques;Taping    PT Next Visit Plan  Do 10th visit progress note: continue balance and gait training    PT Home Exercise Plan  bridging, sit to stand, SLR and clam shell exs given on 06-02-18; clam removed 1/16 due to hip pain, added seated lt shdr ER with scap adduction    Consulted and Agree with Plan of Care  Patient    Family Member Consulted  spouse       Patient will benefit from skilled therapeutic intervention in order to improve the following deficits and impairments:  Abnormal gait, Decreased activity tolerance, Decreased balance, Decreased mobility, Decreased strength, Decreased endurance, Decreased safety awareness, Difficulty walking  Visit Diagnosis: Other abnormalities of gait and mobility  Unsteadiness on feet  Muscle weakness (generalized)     Problem List Patient Active Problem List   Diagnosis Date Noted  . 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 Right spastic hemiplegia ([place]) 10/13/2017  . Acute lower UTI   . Labile blood pressure   . Abnormal urinalysis   . Trauma   . Closed compression fracture of L4 lumbar vertebra   . Neurogenic bladder   . History of hypertension   . Anemia of chronic disease   . Hypoalbuminemia due to protein-calorie malnutrition ([place])   . Acute bilateral cerebral infarction in a watershed distribution  [facility]) 04/05/2017  . Benign essential HTN   . Chronic diastolic heart failure ([place])   . Right spastic hemiparesis ([place])   . History of stroke 02/16/2017  . Abnormality of gait following cerebrovascular accident (CVA) 02/16/2017  . Slow transit constipation   . Acute blood loss anemia   . Adjustment disorder with depressed mood   . Cerebrovascular accident (CVA) ([place]) 02/03/2016  . Dysarthria,  post-stroke   . Dysphagia, post-stroke   . Leukocytosis   . Prediabetes   . Right hemiparesis ([place])   . Cerebral infarction due to unspecified mechanism   . Other secondary hypertension   . Overactive bladder   . Diastolic dysfunction   . Hyperlipidemia   . Essential hypertension   . Essential tremor   . Stroke (cerebrum) ([place]) 02/01/2016  . Facial droop due to stroke 02/01/2016  . Essential and other specified forms of tremor 02/20/2013  . Dysphonia 02/20/2013  . Trigeminal neuralgia 02/20/2013    Alda Lea, PT 07/06/2018, 12:28 PM  [place] [address] [place], [place], [address] Phone: [phone]   Fax:  [phone]  Name: KAILE BIXLER MRN: [id number] Date of Birth: [birth year]

## 2018-07-07 ENCOUNTER — Ambulatory Visit: Payer: Medicare Other | Admitting: Physical Therapy

## 2018-07-07 ENCOUNTER — Encounter: Payer: Medicare Other | Admitting: Occupational Therapy

## 2018-07-11 DIAGNOSIS — G8111 Spastic hemiplegia affecting right dominant side: Secondary | ICD-10-CM | POA: Diagnosis not present

## 2018-07-11 DIAGNOSIS — E559 Vitamin D deficiency, unspecified: Secondary | ICD-10-CM | POA: Diagnosis not present

## 2018-07-11 DIAGNOSIS — M79671 Pain in right foot: Secondary | ICD-10-CM | POA: Diagnosis not present

## 2018-07-11 DIAGNOSIS — I679 Cerebrovascular disease, unspecified: Secondary | ICD-10-CM | POA: Diagnosis not present

## 2018-07-11 DIAGNOSIS — M8580 Other specified disorders of bone density and structure, unspecified site: Secondary | ICD-10-CM | POA: Diagnosis not present

## 2018-07-11 DIAGNOSIS — I739 Peripheral vascular disease, unspecified: Secondary | ICD-10-CM | POA: Diagnosis not present

## 2018-07-12 ENCOUNTER — Other Ambulatory Visit (HOSPITAL_COMMUNITY): Payer: Self-pay | Admitting: Internal Medicine

## 2018-07-12 ENCOUNTER — Ambulatory Visit: Payer: Medicare Other | Admitting: Physical Therapy

## 2018-07-12 ENCOUNTER — Encounter: Payer: Medicare Other | Admitting: Occupational Therapy

## 2018-07-12 DIAGNOSIS — I739 Peripheral vascular disease, unspecified: Secondary | ICD-10-CM

## 2018-07-13 ENCOUNTER — Ambulatory Visit (HOSPITAL_COMMUNITY)
Admission: RE | Admit: 2018-07-13 | Discharge: 2018-07-13 | Disposition: A | Discharge status: Home / Self Care | Payer: Medicare Other | Source: Ambulatory Visit | Attending: Vascular Surgery | Admitting: Vascular Surgery

## 2018-07-13 ENCOUNTER — Ambulatory Visit (INDEPENDENT_AMBULATORY_CARE_PROVIDER_SITE_OTHER)
Admission: RE | Admit: 2018-07-13 | Discharge: 2018-07-13 | Disposition: A | Discharge status: Home / Self Care | Payer: Medicare Other | Source: Ambulatory Visit | Attending: Vascular Surgery | Admitting: Vascular Surgery

## 2018-07-13 ENCOUNTER — Encounter (HOSPITAL_COMMUNITY): Payer: Self-pay

## 2018-07-13 DIAGNOSIS — I739 Peripheral vascular disease, unspecified: Secondary | ICD-10-CM | POA: Insufficient documentation

## 2018-07-14 ENCOUNTER — Ambulatory Visit: Payer: Medicare Other | Admitting: Physical Therapy

## 2018-07-14 ENCOUNTER — Encounter: Payer: Medicare Other | Admitting: Occupational Therapy

## 2018-07-19 ENCOUNTER — Ambulatory Visit: Payer: Medicare Other | Admitting: Physical Therapy

## 2018-07-19 ENCOUNTER — Encounter: Payer: Medicare Other | Admitting: Occupational Therapy

## 2018-07-21 ENCOUNTER — Ambulatory Visit: Payer: Medicare Other | Admitting: Occupational Therapy

## 2018-07-21 ENCOUNTER — Ambulatory Visit: Payer: Medicare Other | Admitting: Physical Therapy

## 2018-07-27 ENCOUNTER — Ambulatory Visit: Payer: Medicare Other | Admitting: Occupational Therapy

## 2018-07-29 ENCOUNTER — Ambulatory Visit: Payer: Medicare Other | Admitting: Occupational Therapy

## 2018-08-12 ENCOUNTER — Ambulatory Visit: Payer: Medicare Other | Admitting: Physical Medicine & Rehabilitation

## 2018-12-26 DIAGNOSIS — E7800 Pure hypercholesterolemia, unspecified: Secondary | ICD-10-CM | POA: Diagnosis not present

## 2018-12-26 DIAGNOSIS — R82998 Other abnormal findings in urine: Secondary | ICD-10-CM | POA: Diagnosis not present

## 2018-12-26 DIAGNOSIS — M859 Disorder of bone density and structure, unspecified: Secondary | ICD-10-CM | POA: Diagnosis not present

## 2019-01-03 DIAGNOSIS — E7800 Pure hypercholesterolemia, unspecified: Secondary | ICD-10-CM | POA: Diagnosis not present

## 2019-01-03 DIAGNOSIS — I679 Cerebrovascular disease, unspecified: Secondary | ICD-10-CM | POA: Diagnosis not present

## 2019-01-03 DIAGNOSIS — G8111 Spastic hemiplegia affecting right dominant side: Secondary | ICD-10-CM | POA: Diagnosis not present

## 2019-01-03 DIAGNOSIS — E559 Vitamin D deficiency, unspecified: Secondary | ICD-10-CM | POA: Diagnosis not present

## 2019-01-03 DIAGNOSIS — G250 Essential tremor: Secondary | ICD-10-CM | POA: Diagnosis not present

## 2019-01-03 DIAGNOSIS — Z0000 Encounter for general adult medical examination without abnormal findings: Secondary | ICD-10-CM | POA: Diagnosis not present

## 2019-01-03 DIAGNOSIS — K219 Gastro-esophageal reflux disease without esophagitis: Secondary | ICD-10-CM | POA: Diagnosis not present

## 2019-01-03 DIAGNOSIS — M8580 Other specified disorders of bone density and structure, unspecified site: Secondary | ICD-10-CM | POA: Diagnosis not present

## 2019-01-03 DIAGNOSIS — E46 Unspecified protein-calorie malnutrition: Secondary | ICD-10-CM | POA: Diagnosis not present

## 2019-01-03 DIAGNOSIS — G500 Trigeminal neuralgia: Secondary | ICD-10-CM | POA: Diagnosis not present

## 2019-01-03 DIAGNOSIS — L409 Psoriasis, unspecified: Secondary | ICD-10-CM | POA: Diagnosis not present

## 2019-01-03 DIAGNOSIS — Z1339 Encounter for screening examination for other mental health and behavioral disorders: Secondary | ICD-10-CM | POA: Diagnosis not present

## 2019-01-03 DIAGNOSIS — N3281 Overactive bladder: Secondary | ICD-10-CM | POA: Diagnosis not present

## 2019-03-16 DIAGNOSIS — Z23 Encounter for immunization: Secondary | ICD-10-CM | POA: Diagnosis not present

## 2019-06-10 ENCOUNTER — Ambulatory Visit: Attending: Internal Medicine

## 2019-06-10 DIAGNOSIS — Z23 Encounter for immunization: Secondary | ICD-10-CM | POA: Insufficient documentation

## 2019-06-10 NOTE — Progress Notes (Signed)
   Covid-19 Vaccination Clinic  Name:  Kelly [name]    MRN: [id number] DOB: [birth year]  06/10/2019  Kelly [name] was observed post Covid-19 immunization for 15 minutes without incidence. She was provided with Vaccine Information Sheet and instruction to access the V-Safe system.   Kelly [name] was instructed to call 911 with any severe reactions post vaccine: Marland Kitchen Difficulty breathing  . Swelling of your face and throat  . A fast heartbeat  . A bad rash all over your body  . Dizziness and weakness    Immunizations Administered    Name Date Dose VIS Date Route   Pfizer COVID-19 Vaccine 06/10/2019  1:52 PM 0.3 mL 05/05/2019 Intramuscular   Manufacturer: [employer], [employer]   Lot: [id number]   NDC: [address]-8

## 2019-06-28 ENCOUNTER — Ambulatory Visit: Attending: Internal Medicine

## 2019-06-28 ENCOUNTER — Ambulatory Visit

## 2019-06-28 DIAGNOSIS — Z23 Encounter for immunization: Secondary | ICD-10-CM | POA: Insufficient documentation

## 2019-06-28 NOTE — Progress Notes (Signed)
   Covid-19 Vaccination Clinic  Name:  LASHENA SIGNER    MRN: [id number] DOB: 1927/09/15  06/28/2019  Ms. Gumz was observed post Covid-19 immunization for 30 minutes based on pre-vaccination screening without incidence. She was provided with Vaccine Information Sheet and instruction to access the V-Safe system.   Ms. Minotti was instructed to call 911 with any severe reactions post vaccine: Marland Kitchen Difficulty breathing  . Swelling of your face and throat  . A fast heartbeat  . A bad rash all over your body  . Dizziness and weakness    Immunizations Administered    Name Date Dose VIS Date Route   Pfizer COVID-19 Vaccine 06/28/2019 11:42 AM 0.3 mL 05/05/2019 Intramuscular   Manufacturer: [employer], [employer]   Lot: [id number]   NDC: [address]-4

## 2020-10-17 ENCOUNTER — Observation Stay (HOSPITAL_COMMUNITY): Payer: Medicare Other

## 2020-10-17 ENCOUNTER — Observation Stay (HOSPITAL_COMMUNITY)
Admission: EM | Admit: 2020-10-17 | Discharge: 2020-10-18 | Disposition: A | Discharge status: Home / Self Care | Payer: Medicare Other | Attending: Internal Medicine | Admitting: Internal Medicine

## 2020-10-17 ENCOUNTER — Emergency Department (HOSPITAL_COMMUNITY): Payer: Medicare Other

## 2020-10-17 DIAGNOSIS — Z79899 Other long term (current) drug therapy: Secondary | ICD-10-CM | POA: Diagnosis not present

## 2020-10-17 DIAGNOSIS — R2990 Unspecified symptoms and signs involving the nervous system: Secondary | ICD-10-CM

## 2020-10-17 DIAGNOSIS — G459 Transient cerebral ischemic attack, unspecified: Secondary | ICD-10-CM | POA: Diagnosis not present

## 2020-10-17 DIAGNOSIS — I110 Hypertensive heart disease with heart failure: Secondary | ICD-10-CM | POA: Diagnosis not present

## 2020-10-17 DIAGNOSIS — R4701 Aphasia: Secondary | ICD-10-CM | POA: Diagnosis present

## 2020-10-17 DIAGNOSIS — I10 Essential (primary) hypertension: Secondary | ICD-10-CM | POA: Diagnosis present

## 2020-10-17 DIAGNOSIS — R7303 Prediabetes: Secondary | ICD-10-CM | POA: Diagnosis present

## 2020-10-17 DIAGNOSIS — Z20822 Contact with and (suspected) exposure to covid-19: Secondary | ICD-10-CM | POA: Insufficient documentation

## 2020-10-17 DIAGNOSIS — R2681 Unsteadiness on feet: Secondary | ICD-10-CM | POA: Diagnosis not present

## 2020-10-17 DIAGNOSIS — I5032 Chronic diastolic (congestive) heart failure: Secondary | ICD-10-CM | POA: Diagnosis not present

## 2020-10-17 DIAGNOSIS — R2689 Other abnormalities of gait and mobility: Secondary | ICD-10-CM | POA: Insufficient documentation

## 2020-10-17 DIAGNOSIS — E785 Hyperlipidemia, unspecified: Secondary | ICD-10-CM | POA: Diagnosis present

## 2020-10-17 LAB — CBC
HCT: 39.1 % (ref 36.0–46.0)
Hemoglobin: 13 g/dL (ref 12.0–15.0)
MCH: 30 pg (ref 26.0–34.0)
MCHC: 33.2 g/dL (ref 30.0–36.0)
MCV: 90.3 fL (ref 80.0–100.0)
Platelets: 228 10*3/uL (ref 150–400)
RBC: 4.33 MIL/uL (ref 3.87–5.11)
RDW: 12.7 % (ref 11.5–15.5)
WBC: 8.7 10*3/uL (ref 4.0–10.5)
nRBC: 0 % (ref 0.0–0.2)

## 2020-10-17 LAB — COMPREHENSIVE METABOLIC PANEL
ALT: 34 U/L (ref 0–44)
AST: 29 U/L (ref 15–41)
Albumin: 3.7 g/dL (ref 3.5–5.0)
Alkaline Phosphatase: 66 U/L (ref 38–126)
Anion gap: 8 (ref 5–15)
BUN: 23 mg/dL (ref 8–23)
CO2: 27 mmol/L (ref 22–32)
Calcium: 9.1 mg/dL (ref 8.9–10.3)
Chloride: 100 mmol/L (ref 98–111)
Creatinine, Ser: 0.62 mg/dL (ref 0.44–1.00)
GFR, Estimated: >60 mL/min (ref >60)
Glucose, Bld: 103 mg/dL — ABNORMAL HIGH (ref 70–99)
Potassium: 4.5 mmol/L (ref 3.5–5.1)
Sodium: 135 mmol/L (ref 135–145)
Total Bilirubin: 0.7 mg/dL (ref 0.3–1.2)
Total Protein: 6.9 g/dL (ref 6.5–8.1)

## 2020-10-17 LAB — I-STAT CHEM 8, ED
BUN: 26 mg/dL — ABNORMAL HIGH (ref 8–23)
Calcium, Ion: 1.18 mmol/L (ref 1.15–1.40)
Chloride: 99 mmol/L (ref 98–111)
Creatinine, Ser: 0.6 mg/dL (ref 0.44–1.00)
Glucose, Bld: 103 mg/dL — ABNORMAL HIGH (ref 70–99)
HCT: 39 % (ref 36.0–46.0)
Hemoglobin: 13.3 g/dL (ref 12.0–15.0)
Potassium: 4.6 mmol/L (ref 3.5–5.1)
Sodium: 138 mmol/L (ref 135–145)
TCO2: 27 mmol/L (ref 22–32)

## 2020-10-17 LAB — LIPID PANEL
Cholesterol: 110 mg/dL (ref 0–200)
HDL: 37 mg/dL — ABNORMAL LOW (ref >40)
LDL Cholesterol: 59 mg/dL (ref 0–99)
Total CHOL/HDL Ratio: 3 RATIO
Triglycerides: 69 mg/dL (ref <150)
VLDL: 14 mg/dL (ref 0–40)

## 2020-10-17 LAB — PROTIME-INR
INR: 1 (ref 0.8–1.2)
Prothrombin Time: 13.2 seconds (ref 11.4–15.2)

## 2020-10-17 LAB — DIFFERENTIAL
Abs Immature Granulocytes: 0.03 10*3/uL (ref 0.00–0.07)
Basophils Absolute: 0.1 10*3/uL (ref 0.0–0.1)
Basophils Relative: 1 %
Eosinophils Absolute: 0.2 10*3/uL (ref 0.0–0.5)
Eosinophils Relative: 2 %
Immature Granulocytes: 0 %
Lymphocytes Relative: 25 %
Lymphs Abs: 2.2 10*3/uL (ref 0.7–4.0)
Monocytes Absolute: 1 10*3/uL (ref 0.1–1.0)
Monocytes Relative: 11 %
Neutro Abs: 5.3 10*3/uL (ref 1.7–7.7)
Neutrophils Relative %: 61 %

## 2020-10-17 LAB — APTT: aPTT: 30 seconds (ref 24–36)

## 2020-10-17 LAB — CBG MONITORING, ED: Glucose-Capillary: 101 mg/dL — ABNORMAL HIGH (ref 70–99)

## 2020-10-17 MED ORDER — OXYBUTYNIN CHLORIDE ER 5 MG PO TB24
10.0000 mg | ORAL_TABLET | Freq: Every day | ORAL | Status: DC
  Administered 2020-10-18: 10 mg via ORAL
  Filled 2020-10-17: qty 2

## 2020-10-17 MED ORDER — CLOPIDOGREL BISULFATE 75 MG PO TABS
75.0000 mg | ORAL_TABLET | Freq: Every day | ORAL | Status: DC
  Administered 2020-10-17 – 2020-10-18 (×2): 75 mg via ORAL
  Filled 2020-10-17 (×2): qty 1

## 2020-10-17 MED ORDER — ASPIRIN 300 MG RE SUPP: 300.0000 mg | Freq: Every day | RECTAL | Status: DC

## 2020-10-17 MED ORDER — ENOXAPARIN SODIUM 30 MG/0.3ML IJ SOSY
30.0000 mg | PREFILLED_SYRINGE | INTRAMUSCULAR | Status: DC
  Filled 2020-10-17: qty 0.3

## 2020-10-17 MED ORDER — ASPIRIN 81 MG PO CHEW
81.0000 mg | CHEWABLE_TABLET | Freq: Every day | ORAL | Status: DC
  Administered 2020-10-17 – 2020-10-18 (×2): 81 mg via ORAL
  Filled 2020-10-17 (×2): qty 1

## 2020-10-17 MED ORDER — SODIUM CHLORIDE 0.9% FLUSH
3.0000 mL | Freq: Once | INTRAVENOUS | Status: AC
  Administered 2020-10-17: 3 mL via INTRAVENOUS

## 2020-10-17 MED ORDER — ATORVASTATIN CALCIUM 40 MG PO TABS
40.0000 mg | ORAL_TABLET | Freq: Every day | ORAL | Status: DC
  Filled 2020-10-17: qty 1

## 2020-10-17 NOTE — Code Documentation (Signed)
Stroke Response Nurse Documentation Code Documentation  Kelly [name] is a 87 y.o. female arriving to [facility] ED via [facility] on 10/17/2020 with past medical hx of CVA with right sided deficits. Code stroke was activated by EMS. Patient from home where she was LKW at 1730 and now complaining of garbled speech and aphasia. On clopidogrel 75 mg daily. Stroke team at the bedside on patient arrival. Labs drawn and patient cleared for CT by Dr. Stevie Kern. Patient to CT with team. NIHSS 8, see documentation for details and code stroke times. Patient with disoriented, right arm weakness, right leg weakness, Expressive aphasia  and right neglect on exam. The following imaging was completed: CT. Patient is not a candidate for tPA due to symptoms improving.  Care/Plan: q2h NIHSS and VS Bedside handoff with ED RN Chestine Spore.    Kelly [name] L [name]  Rapid Response RN

## 2020-10-17 NOTE — ED Triage Notes (Signed)
Patient BIB [facility] from home for sudden onset of intermittent aphasia at 1700 today while talking to husband. History of previous stroke with right sided weakness.

## 2020-10-17 NOTE — Consult Note (Signed)
NEUROLOGY CONSULTATION NOTE   Date of service: Oct 17, 2020 Patient Name: Kelly [name] MRN:  [id number] DOB:  06-19-1927 Reason for consult: "Stroke code" Requesting Provider: Milagros Loll, MD _ _ _   _ __   _ __ _ _  __ __   _ __   __ _  History of Present Illness  Kelly [name] is a 87 y.o. female with PMH significant for prior stroke with residual R sided weakness, HLD, HTN, Prediabetes, essential tremor, diastolic CHF who presents with an episode of aphasia.  Patient was reading a book and when husband was right next were he noticed that she was having difficulty explaining to him what she was reading.  She seemed very frustrated and could not get the right words out.  He noticed some garbling of her speech and mostly just word salad.  This continued for several minutes.  She can seem to be able to follow some commands and was able to smile when asked.  He called EMS who brought her in as a stroke code.  Per EMS they did notice aphasia initially which kept on improving was intermittent in route.  On my evaluation here, she had significant improvement in her aphasia.  The aphasia was noted to be mild.  She takes Plavix at home did not miss any doses.  Does not smoke.  Has had 2 strokes in the past.  Endorses significant right upper extremity weakness as a residual deficit from her prior stroke and is unable to do anything with her right arm and also has mild right leg weakness which is also residual from her prior stroke.  She thinks her right arm and leg are at her baseline.  Of note, enroute, EMS noted that she was hypoxic and she was started on supplemental oxygen.  Here since she is saturating 97% on room air.  Vitals with mildly elevated blood pressure, normothermia, no tachycardia, glucose of 103.  Husband reports that patient is essentially wheelchair bound, can stand up but does not walk. Needs assistance with using the bathroom, dressing undressing and shower.  MRS: 4. TPA:  not offered, given advanced age, spontaneously improving aphasia, tPA was not offered. Thou her NIHSS is elevated to a 7, mot of that if from residual deficit from her prior strokes. Thrombectomy: Not offered due to poor baseline and spontaneously improving symptoms. LKW: 1730 on 10/17/20.  NIHSS components Score: Comment  1a Level of Conscious 0[x]  1[]  2[]  3[]      1b LOC Questions 0[]  1[x]  2[]       1c LOC Commands 0[x]  1[]  2[]       2 Best Gaze 0[x]  1[]  2[]       3 Visual 0[x]  1[]  2[]  3[]      4 Facial Palsy 0[x]  1[]  2[]  3[]      5a Motor Arm - left 0[x]  1[]  2[]  3[]  4[]  UN[]    5b Motor Arm - Right 0[]  1[]  2[]  3[x]  4[]  UN[]  Old deficit  6a Motor Leg - Left 0[x]  1[]  2[]  3[]  4[]  UN[]    6b Motor Leg - Right 0[]  1[x]  2[]  3[]  4[]  UN[]  Old deficit  7 Limb Ataxia 0[x]  1[]  2[]  3[]  UN[]     8 Sensory 0[x]  1[]  2[]  UN[]      9 Best Language 0[]  1[x]  2[]  3[]    Mild and improving  10 Dysarthria 0[x]  1[]  2[]  UN[]      11 Extinct. and Inattention 0[]  1[x]  2[]     In R leg  TOTAL: 7  ROS   Constitutional Denies weight loss, fever and chills.  HEENT Denies changes in vision and hearing.  Respiratory Denies SOB and cough.  CV Denies palpitations and CP  GI Denies abdominal pain, nausea, vomiting and diarrhea.  GU Denies dysuria and urinary frequency.  MSK Denies myalgia and joint pain.  Skin Denies rash and pruritus.  Neurological Denies headache and syncope.  Psychiatric Denies recent changes in mood. Denies anxiety and depression.   Past History   Past Medical History:  Diagnosis Date  . Acute blood loss anemia   . Adjustment disorder with depressed mood   . CVA (cerebral vascular accident) (HCC) 02/03/2016   Linear infarct within the posterior limb of left internal capsule  . Diastolic CHF (HCC)   . Dysphagia   . Dysphonia 02/20/2013  . Essential and other specified forms of tremor 02/20/2013  . HLD (hyperlipidemia)   . HTN (hypertension)   . OAB (overactive bladder)   . Osteoporosis    . Patient receiving subcutaneous heparin    For DVT prophylaxis 9/17  . Prediabetes   . Psoriasis   . Slow transit constipation   . Stroke (cerebrum) (HCC) 02/01/2016  . Trigeminal neuralgia 02/20/2013   Past Surgical History:  Procedure Laterality Date  . APPENDECTOMY  1940  . bladder tack    . CATARACT EXTRACTION Bilateral   . HEMORRHOID SURGERY  1960  . HUMERUS FRACTURE SURGERY    . LAPAROSCOPIC HYSTERECTOMY  2006  . torn rotator cuff    . WRIST FRACTURE SURGERY  2005   seconday shoulder 2001   Family History  Problem Relation Age of Onset  . Arthritis Brother        spine  . Osteoporosis Maternal Grandmother    Social History   Socioeconomic History  . Marital status: Married    Spouse name: Kelly [name]  . Number of children: 3  . Years of education: college  . Highest education level: Not on file  Occupational History  . Occupation: real estate    Comment: retired  Tobacco Use  . Smoking status: Never Smoker  . Smokeless tobacco: Never Used  Vaping Use  . Vaping Use: Never used  Substance and Sexual Activity  . Alcohol use: Yes    Comment: Wine 2 glass daily occasionally  . Drug use: No  . Sexual activity: Not Currently    Partners: Male    Birth control/protection: Post-menopausal  Other Topics Concern  . Not on file  Social History Narrative   Lives at home with her husband.   Right-handed.   No caffeine use.   Social Determinants of Health   Financial Resource Strain: Not on file  Food Insecurity: Not on file  Transportation Needs: Not on file  Physical Activity: Not on file  Stress: Not on file  Social Connections: Not on file   No Known Allergies  Medications  (Not in a hospital admission)    Vitals   Vitals:   10/17/20 1908 10/17/20 1913 10/17/20 1915  BP: (!) 154/91 (!) 144/83   Pulse: 85 78   Resp: 16 14   Temp:   98.1 F (36.7 C)  TempSrc:   Oral  SpO2: 98% 98%      There is no height or weight on file to calculate  BMI.  Physical Exam   General: Laying comfortably in bed; in no acute distress. HENT: Normal oropharynx and mucosa. Normal external appearance of ears and nose. Neck: Supple, no pain or tenderness CV: No  JVD. No peripheral edema.  Pulmonary: Symmetric Chest rise. Normal respiratory effort.  Abdomen: Soft to touch, non-tender.  Ext: No cyanosis, edema, or deformity  Skin: No rash. Normal palpation of skin.   Musculoskeletal: Normal digits and nails by inspection. No clubbing.   Neurologic Examination  Mental status/Cognition: Alert, oriented to self, place, but not to month and year, good attention.  Speech/language: soft, fluent, comprehension intact, object naming intact to most but not all objects, repetition intact.  Cranial nerves:   CN II Pupils equal and reactive to light, no VF deficits    CN III,IV,VI EOM intact, no gaze preference or deviation, no nystagmus    CN V normal sensation in V1, V2, and V3 segments bilaterally    CN VII no asymmetry, no nasolabial fold flattening    CN VIII normal hearing to speech    CN IX & X normal palatal elevation, no uvular deviation    CN XI 5/5 head turn and 5/5 shoulder shrug bilaterally    CN XII midline tongue protrusion    Motor:  Muscle bulk: poor, tone normal, tremor yes high frequency, low amplitude tremor consistent with hx of essential tremor.  Mvmt Root Nerve  Muscle Right Left Comments  SA C5/6 Ax Deltoid 1 4+   EF C5/6 Mc Biceps 0 4+   EE C6/7/8 Rad Triceps 0 4+   WF C6/7 Med FCR     WE C7/8 PIN ECU     F Ab C8/T1 U ADM/FDI 1 5   HF L1/2/3 Fem Illopsoas 3 4   KE L2/3/4 Fem Quad     DF L4/5 D Peron Tib Ant 2 4   PF S1/2 Tibial Grc/Sol 2 4    Reflexes:  Right Left Comments  Pectoralis      Biceps (C5/6) 1 1   Brachioradialis (C5/6)  1    Triceps (C6/7)  1    Patellar (L3/4) 1 1    Achilles (S1)      Hoffman      Plantar     Jaw jerk    Sensation:  Light touch Intact throughout   Pin prick    Temperature     Vibration   Proprioception    Coordination/Complex Motor:  - Finger to Nose intact on the left - Heel to shin unable to do. - Rapid alternating movement are slowed - Gait: unable to assess, patient is wheelchair bound at baseline.  Labs   CBC:  Recent Labs  Lab 10/17/20 1857  HGB 13.3  HCT 39.0    Basic Metabolic Panel:  Lab Results  Component Value Date   NA 138 10/17/2020   K 4.6 10/17/2020   CO2 22 04/06/2017   GLUCOSE 103 (H) 10/17/2020   BUN 26 (H) 10/17/2020   CREATININE 0.60 10/17/2020   CALCIUM 8.7 (L) 04/06/2017   GFRNONAA >60 04/12/2017   GFRAA >60 04/12/2017   Lipid Panel:  Lab Results  Component Value Date   LDLCALC 58 04/04/2017   HgbA1c:  Lab Results  Component Value Date   HGBA1C 5.6 04/04/2017   Urine Drug Screen:     Component Value Date/Time   LABOPIA NONE DETECTED 04/03/2017 2214   COCAINSCRNUR NONE DETECTED 04/03/2017 2214   LABBENZ NONE DETECTED 04/03/2017 2214   AMPHETMU NONE DETECTED 04/03/2017 2214   THCU NONE DETECTED 04/03/2017 2214   LABBARB NONE DETECTED 04/03/2017 2214    Alcohol Level     Component Value Date/Time   ETH <10 04/03/2017 2214  CT Head without contrast:(personally reviewed) CTH was negative for a large hypodensity concerning for a large territory infarct or hyperdensity concerning for an ICH.  MR Angio head without contrast and Carotid Duplex BL: pending  MRI Brain: pending  rEEG:  Pending.  Impression   Breck H Norcia is a 87 y.o. female with PMH significant for prior strokes with residual R sided weakness, hx of HTN, HLD who presents with an episode of aphasia that is spontaneously improving. Her neurologic examination is notable for R side weakness, mild aphasia and on repeat exam with her husband at bedside, patient noted to be back to her baseline.  Primary Diagnosis:  Cerebral infarction, unspecified.  Secondary Diagnosis: Essential (primary) hypertension,  Hyperlipidemia.  Recommendations   Plan:  - Frequent Neuro checks per stroke unit protocol - Recommend brain imaging with MRI Brain without contrast - Recommend Vascular imaging with MRA Angio Head without contrast and US Carotid doppler - Recommend obtaining TTE - Recommend obtaining Lipid panel with LDL - Please start statin if LDL > 70 - Recommend HbA1c - Antithrombotic - aspirin 81mg  daily along with plavix 75mg  daily for 21 days, then plavix 75mg  daily alone. - Recommend DVT ppx - SBP goal - permissive hypertension first 24 h < 220/110. Held home meds.  - Recommend Telemetry monitoring for arrythmia - Recommend bedside swallow screen prior to PO intake. - Stroke education booklet - Recommend PT/OT/SLP consult - Recommend routine EEG. ______________________________________________________________________   Thank you for the opportunity to take part in the care of this patient. If you have any further questions, please contact the neurology consultation attending.  Signed,  Erick BlinksSalman Caeli Linehan [facility] Pager Number [phone] _ _ _   _ __   _ __ _ _  __ __   _ __   __ _

## 2020-10-17 NOTE — ED Notes (Signed)
Patient transported to MRI 

## 2020-10-17 NOTE — H&P (Signed)
History and Physical    Kelly [name] EVO:[id number] DOB: 09-30-1927 DOA: 10/17/2020  PCP: Gaspar Garbe, MD Patient coming from: Home  Chief Complaint: Aphasia  HPI: Kelly [name] is a 87 y.o. female with medical history significant of prior stroke with residual right-sided weakness, hypertension, hyperlipidemia, prediabetes, essential tremor, chronic diastolic CHF presented to the ED via EMS as code stroke for evaluation of aphasia which was improving in route.  tPA was not given due to advanced age and improving symptoms.  Head CT negative for acute intracranial abnormality.  Hemodynamically stable.  Labs showing WBC 8.7, hemoglobin 13.0, platelet count 228K.  I-STAT chemistry showing sodium 138, potassium 4.6, chloride 99, bicarb 27, BUN 26, creatinine 0.6, glucose 103.  CMP pending.  INR 1.0. Neurology ordered aspirin and Plavix.  MRI brain and MRA head ordered.   Patient is hard of hearing.  History provided mostly by husband at bedside who states that around 5:30 PM while he was talking to the patient he noticed that her speech became garbled all of a sudden and she was not able to express what she had to say.  He did not notice any drooping of her face.  States she has had 2 prior strokes which have left her with weakness in her right arm and leg.  She uses a wheelchair to ambulate.  She is not able to use her right arm at all at baseline.  Husband feels that her weakness has not changed and is at baseline.  He did not notice any left-sided weakness.  States otherwise she has been in her usual state of health.  No fevers, cough, shortness of breath, chest pain, nausea, vomiting, abdominal pain, diarrhea, or dysuria.  Review of Systems:  All systems reviewed and apart from history of presenting illness, are negative.  Past Medical History:  Diagnosis Date  . Acute blood loss anemia   . Adjustment disorder with depressed mood   . CVA (cerebral vascular accident) (HCC) 02/03/2016    Linear infarct within the posterior limb of left internal capsule  . Diastolic CHF (HCC)   . Dysphagia   . Dysphonia 02/20/2013  . Essential and other specified forms of tremor 02/20/2013  . HLD (hyperlipidemia)   . HTN (hypertension)   . OAB (overactive bladder)   . Osteoporosis   . Patient receiving subcutaneous heparin    For DVT prophylaxis 9/17  . Prediabetes   . Psoriasis   . Slow transit constipation   . Stroke (cerebrum) (HCC) 02/01/2016  . Trigeminal neuralgia 02/20/2013    Past Surgical History:  Procedure Laterality Date  . APPENDECTOMY  1940  . bladder tack    . CATARACT EXTRACTION Bilateral   . HEMORRHOID SURGERY  1960  . HUMERUS FRACTURE SURGERY    . LAPAROSCOPIC HYSTERECTOMY  2006  . torn rotator cuff    . WRIST FRACTURE SURGERY  2005   seconday shoulder 2001     reports that she has never smoked. She has never used smokeless tobacco. She reports current alcohol use. She reports that she does not use drugs.  No Known Allergies  Family History  Problem Relation Age of Onset  . Arthritis Brother        spine  . Osteoporosis Maternal Grandmother     Prior to Admission medications   Medication Sig Start Date End Date Taking? Authorizing Provider  atorvastatin (LIPITOR) 40 MG tablet Take 1 tablet (40 mg total) daily at 6 PM by mouth. 04/13/17  AngiulliMcarthur Rossetti, PA-C  clopidogrel (PLAVIX) 75 MG tablet Take 1 tablet (75 mg total) daily by mouth. 04/13/17   Angiulli, Mcarthur Rossetti, PA-C  diclofenac sodium (VOLTAREN) 1 % GEL Apply 2 g topically 4 (four) times daily. 06/10/18   Marcello Fennel, MD  oxybutynin (DITROPAN) 5 MG tablet Take 1 tablet (5 mg total) at bedtime by mouth. 04/13/17   Angiulli, Mcarthur Rossetti, PA-C  topiramate (TOPAMAX) 50 MG tablet Take 1 tablet (50 mg total) 2 (two) times daily by mouth. 04/13/17   Charlton Amor, PA-C    Physical Exam: Vitals:   10/17/20 1908 10/17/20 1913 10/17/20 1915  BP: (!) 154/91 (!) 144/83   Pulse: 85 78   Resp:  16 14   Temp:   98.1 F (36.7 C)  TempSrc:   Oral  SpO2: 98% 98%     Physical Exam Constitutional:      General: She is not in acute distress. HENT:     Head: Normocephalic and atraumatic.  Eyes:     Extraocular Movements: Extraocular movements intact.     Conjunctiva/sclera: Conjunctivae normal.  Cardiovascular:     Rate and Rhythm: Normal rate and regular rhythm.     Pulses: Normal pulses.  Pulmonary:     Effort: Pulmonary effort is normal. No respiratory distress.     Breath sounds: Normal breath sounds. No wheezing or rales.  Abdominal:     General: Bowel sounds are normal. There is no distension.     Palpations: Abdomen is soft.     Tenderness: There is no abdominal tenderness.  Musculoskeletal:        General: No swelling or tenderness.     Cervical back: Normal range of motion and neck supple.  Skin:    General: Skin is warm and dry.  Neurological:     Mental Status: She is alert.     Cranial Nerves: No cranial nerve deficit.     Sensory: No sensory deficit.     Motor: Weakness present.     Comments: Right upper and lower extremity weakness     Labs on Admission: I have personally reviewed following labs and imaging studies  CBC: Recent Labs  Lab 10/17/20 1845 10/17/20 1857  WBC 8.7  --   NEUTROABS 5.3  --   HGB 13.0 13.3  HCT 39.1 39.0  MCV 90.3  --   PLT 228  --    Basic Metabolic Panel: Recent Labs  Lab 10/17/20 1857  NA 138  K 4.6  CL 99  GLUCOSE 103*  BUN 26*  CREATININE 0.60   GFR: CrCl cannot be calculated (Unknown ideal weight.). Liver Function Tests: No results for input(s): AST, ALT, ALKPHOS, BILITOT, PROT, ALBUMIN in the last 168 hours. No results for input(s): LIPASE, AMYLASE in the last 168 hours. No results for input(s): AMMONIA in the last 168 hours. Coagulation Profile: Recent Labs  Lab 10/17/20 1845  INR 1.0   Cardiac Enzymes: No results for input(s): CKTOTAL, CKMB, CKMBINDEX, TROPONINI in the last 168 hours. BNP  (last 3 results) No results for input(s): PROBNP in the last 8760 hours. HbA1C: No results for input(s): HGBA1C in the last 72 hours. CBG: Recent Labs  Lab 10/17/20 1846  GLUCAP 101*   Lipid Profile: No results for input(s): CHOL, HDL, LDLCALC, TRIG, CHOLHDL, LDLDIRECT in the last 72 hours. Thyroid Function Tests: No results for input(s): TSH, T4TOTAL, FREET4, T3FREE, THYROIDAB in the last 72 hours. Anemia Panel: No results for input(s): VITAMINB12,  FOLATE, FERRITIN, TIBC, IRON, RETICCTPCT in the last 72 hours. Urine analysis:    Component Value Date/Time   COLORURINE YELLOW 04/09/2017 1737   APPEARANCEUR CLOUDY (A) 04/09/2017 1737   LABSPEC 1.020 04/09/2017 1737   PHURINE 6.0 04/09/2017 1737   GLUCOSEU NEGATIVE 04/09/2017 1737   HGBUR SMALL (A) 04/09/2017 1737   BILIRUBINUR NEGATIVE 04/09/2017 1737   KETONESUR 5 (A) 04/09/2017 1737   PROTEINUR 30 (A) 04/09/2017 1737   NITRITE NEGATIVE 04/09/2017 1737   LEUKOCYTESUR NEGATIVE 04/09/2017 1737    Radiological Exams on Admission: CT HEAD CODE STROKE WO CONTRAST  Result Date: 10/17/2020 CLINICAL DATA:  Code stroke.  Speech disturbance. EXAM: CT HEAD WITHOUT CONTRAST TECHNIQUE: Contiguous axial images were obtained from the base of the skull through the vertex without intravenous contrast. COMPARISON:  Head CT 04/03/2017 and MRI 04/04/2017 FINDINGS: Brain: There is no evidence of an acute infarct, intracranial hemorrhage, mass, midline shift, or extra-axial fluid collection. Patchy and confluent hypodensities in the cerebral white matter bilaterally have mildly progressed and are nonspecific but compatible with extensive chronic small vessel ischemic disease. Small chronic cortical infarcts are again seen in the frontal and parietal regions bilaterally. Chronic lacunar infarcts are again noted in the deep gray nuclei/deep white matter tracks. There is moderate cerebral atrophy. Vascular: Calcified atherosclerosis at the skull base. No  hyperdense vessel. Skull: No fracture or suspicious osseous lesion. Sinuses/Orbits: Paranasal sinuses and mastoid air cells are clear. Bilateral cataract extraction. Other: None. ASPECTS [facility] Stroke Program Early CT Score) Not scored given the extensive chronic ischemia and current clinical history. IMPRESSION: 1. No evidence of acute intracranial abnormality. 2. Extensive chronic small vessel ischemic disease with multiple chronic infarcts as above. These results were communicated to Dr. Derry Lory at 7:05 pm on 10/17/2020 by text page via the [facility] messaging system. Electronically Signed   By: Sebastian Ache M.D.   On: 10/17/2020 19:05    EKG: Independently reviewed.  Sinus rhythm, LAFB, borderline PR prolongation.  No significant change since prior tracing.  Assessment/Plan Principal Problem:   TIA (transient ischemic attack) Active Problems:   Hyperlipidemia   Essential hypertension   Prediabetes   Chronic diastolic heart failure (HCC)   TIA Aphasia has now improved significantly.  Does have right-sided weakness from prior stroke.  tPA was not given due to advanced age and improving symptoms.  Head CT and brain MRI both negative for acute intracranial abnormality.  Head MRA negative. -Neurology following, appreciate recommendations -Telemetry monitoring -Carotid Dopplers -Echocardiogram -Neurology recommending obtaining routine EEG -Hemoglobin A1c, fasting lipid panel -Antiplatelet therapy: Neurology recommending aspirin 81 mg daily along with Plavix 75 mg daily for 21 days, then Plavix 75 mg daily alone. -Frequent neurochecks -PT, OT, speech therapy. -N.p.o. until cleared by bedside swallow evaluation or formal speech evaluation  Hypertension -Allow permissive hypertension at this time  Hyperlipidemia -Continue high intensity statin  Prediabetes -Check A1c  Chronic diastolic CHF No signs of volume overload at this time. -Continue to monitor with status  closely  Overactive bladder -Continue oxybutynin  DVT prophylaxis: Lovenox Code Status: DNR.  Discussed with the patient and her husband at bedside. Family Communication: Husband updated Disposition Plan: Status is: Observation  The patient remains OBS appropriate and will d/c before 2 midnights.  Dispo: The patient is from: Home              Anticipated d/c is to: Home              Patient currently is not  medically stable to d/c.   Difficult to place patient No  Level of care: Level of care: Telemetry Medical   The medical decision making on this patient was of high complexity and the patient is at high risk for clinical deterioration, therefore this is a level 3 visit.  John GiovanniVasundhra Yanett Conkright MD [facility]  If 7PM-7AM, please contact night-coverage [email or web address]  10/17/2020, 8:21 PM

## 2020-10-17 NOTE — ED Provider Notes (Signed)
[facility] EMERGENCY DEPARTMENT Provider Note   CSN: [id number] Arrival date & time: 10/17/20  1842  An emergency department physician performed an initial assessment on this suspected stroke patient at 1851.  History No chief complaint on file.   Kelly [name] is a 87 y.o. female.  Presenting to ER with concern for sudden onset intermittent aphasia at 5:00 this evening while talking to husband.  Has history of prior stroke and residual right-sided deficits.  History limited due to acuity.  Stroke alert.  Additional history obtained from chart review, report, husband.  Symptoms have grossly resolved and speech is back to baseline.  Patient denies any new numbness or weakness.  HPI     Past Medical History:  Diagnosis Date  . Acute blood loss anemia   . Adjustment disorder with depressed mood   . CVA (cerebral vascular accident) (HCC) 02/03/2016   Linear infarct within the posterior limb of left internal capsule  . Diastolic CHF (HCC)   . Dysphagia   . Dysphonia 02/20/2013  . Essential and other specified forms of tremor 02/20/2013  . HLD (hyperlipidemia)   . HTN (hypertension)   . OAB (overactive bladder)   . Osteoporosis   . Patient receiving subcutaneous heparin    For DVT prophylaxis 9/17  . Prediabetes   . Psoriasis   . Slow transit constipation   . Stroke (cerebrum) (HCC) 02/01/2016  . Trigeminal neuralgia 02/20/2013    Patient Active Problem List   Diagnosis Date Noted  . TIA (transient ischemic attack) 10/17/2020  . 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 Right spastic hemiplegia (HCC) 10/13/2017  . Acute lower UTI   . Labile blood pressure   . Abnormal urinalysis   . Trauma   . Closed compression fracture of L4 lumbar vertebra   . Neurogenic bladder   . History of hypertension   . Anemia of chronic disease   . Hypoalbuminemia due to protein-calorie malnutrition (HCC)   . Acute bilateral cerebral infarction in a watershed  distribution [facility]) 04/05/2017  . Benign essential HTN   . Chronic diastolic heart failure (HCC)   . Right spastic hemiparesis (HCC)   . History of stroke 02/16/2017  . Abnormality of gait following cerebrovascular accident (CVA) 02/16/2017  . Slow transit constipation   . Acute blood loss anemia   . Adjustment disorder with depressed mood   . Cerebrovascular accident (CVA) (HCC) 02/03/2016  . Dysarthria, post-stroke   . Dysphagia, post-stroke   . Leukocytosis   . Prediabetes   . Right hemiparesis (HCC)   . Cerebral infarction due to unspecified mechanism   . Other secondary hypertension   . Overactive bladder   . Diastolic dysfunction   . Hyperlipidemia   . Essential hypertension   . Essential tremor   . Stroke (cerebrum) (HCC) 02/01/2016  . Facial droop due to stroke 02/01/2016  . Essential and other specified forms of tremor 02/20/2013  . Dysphonia 02/20/2013  . Trigeminal neuralgia 02/20/2013    Past Surgical History:  Procedure Laterality Date  . APPENDECTOMY  1940  . bladder tack    . CATARACT EXTRACTION Bilateral   . HEMORRHOID SURGERY  1960  . HUMERUS FRACTURE SURGERY    . LAPAROSCOPIC HYSTERECTOMY  2006  . torn rotator cuff    . WRIST FRACTURE SURGERY  2005   seconday shoulder 2001     OB History   No obstetric history on file.     Family History  Problem Relation Age of  Onset  . Arthritis Brother        spine  . Osteoporosis Maternal Grandmother     Social History   Tobacco Use  . Smoking status: Never Smoker  . Smokeless tobacco: Never Used  Vaping Use  . Vaping Use: Never used  Substance Use Topics  . Alcohol use: Yes    Comment: Wine 2 glass daily occasionally  . Drug use: No    Home Medications Prior to Admission medications   Medication Sig Start Date End Date Taking? Authorizing Provider  acetaminophen (TYLENOL) 500 MG tablet Take 500 mg by mouth every 6 (six) hours as needed for moderate pain.   Yes [provider]   atorvastatin (LIPITOR) 40 MG tablet Take 1 tablet (40 mg total) daily at 6 PM by mouth. 04/13/17  Yes Angiulli, Mcarthur Rossetti, PA-C  clopidogrel (PLAVIX) 75 MG tablet Take 1 tablet (75 mg total) daily by mouth. 04/13/17  Yes Angiulli, Mcarthur Rossetti, PA-C  diphenhydrAMINE (BENADRYL) 25 mg capsule Take 25 mg by mouth every 6 (six) hours as needed for sleep.   Yes [provider]  oxybutynin (DITROPAN-XL) 10 MG 24 hr tablet Take 10 mg by mouth daily. 07/22/20  Yes [provider]  Propylene Glycol (SYSTANE COMPLETE OP) Place 1 drop into both eyes at bedtime.   Yes [provider]  diclofenac sodium (VOLTAREN) 1 % GEL Apply 2 g topically 4 (four) times daily. Patient not taking: No sig reported 06/10/18   Marcello Fennel, MD  oxybutynin (DITROPAN) 5 MG tablet Take 1 tablet (5 mg total) at bedtime by mouth. Patient not taking: No sig reported 04/13/17   Angiulli, Mcarthur Rossetti, PA-C  topiramate (TOPAMAX) 50 MG tablet Take 1 tablet (50 mg total) 2 (two) times daily by mouth. Patient not taking: No sig reported 04/13/17   Angiulli, Mcarthur Rossetti, PA-C    Allergies    Patient has no known allergies.  Review of Systems   Review of Systems  Unable to perform ROS: Acuity of condition    Physical Exam Updated Vital Signs BP (!) 149/71 (BP Location: Right Arm)   Pulse 72   Temp 98.1 F (36.7 C) (Oral)   Resp 18   SpO2 97%   Physical Exam Vitals and nursing note reviewed.  Constitutional:      General: She is not in acute distress.    Appearance: She is well-developed.  HENT:     Head: Normocephalic and atraumatic.  Eyes:     Conjunctiva/sclera: Conjunctivae normal.  Cardiovascular:     Rate and Rhythm: Normal rate and regular rhythm.     Heart sounds: No murmur heard.   Pulmonary:     Effort: Pulmonary effort is normal. No respiratory distress.     Breath sounds: Normal breath sounds.  Abdominal:     Palpations: Abdomen is soft.     Tenderness: There is no abdominal  tenderness.  Musculoskeletal:     Cervical back: Neck supple.  Skin:    General: Skin is warm and dry.  Neurological:     Mental Status: She is alert.     Comments: Alert, speech mostly clear, follows commands, strength and sensation intact in left side, residual right-sided deficits and right upper extremity contracture     ED Results / Procedures / Treatments   Labs (all labs ordered are listed, but only abnormal results are displayed) Labs Reviewed  COMPREHENSIVE METABOLIC PANEL - Abnormal; Notable for the following components:  Result Value   Glucose, Bld 103 (*)    All other components within normal limits  LIPID PANEL - Abnormal; Notable for the following components:   HDL 37 (*)    All other components within normal limits  I-STAT CHEM 8, ED - Abnormal; Notable for the following components:   BUN 26 (*)    Glucose, Bld 103 (*)    All other components within normal limits  CBG MONITORING, ED - Abnormal; Notable for the following components:   Glucose-Capillary 101 (*)    All other components within normal limits  SARS CORONAVIRUS 2 (TAT 6-24 HRS)  PROTIME-INR  APTT  CBC  DIFFERENTIAL  HEMOGLOBIN A1C    EKG None  Radiology MR ANGIO HEAD WO CONTRAST  Result Date: 10/17/2020 CLINICAL DATA:  Acute neuro deficit.  Speech disturbance. EXAM: MRI HEAD WITHOUT CONTRAST MRA HEAD WITHOUT CONTRAST TECHNIQUE: Multiplanar, multi-echo pulse sequences of the brain and surrounding structures were acquired without intravenous contrast. Angiographic images of the Circle of Willis were acquired using MRA technique without intravenous contrast. COMPARISON: No pertinent prior exam. COMPARISON:  Head CT 10/17/2020.  Head MRI and MRA 04/04/2017. FINDINGS: MRI HEAD FINDINGS Brain: There is no evidence of an acute infarct, mass, midline shift, or extra-axial fluid collection. A chronic microhemorrhage in the anterior right parietal lobe is unchanged from the prior MRI. There is prominent  vascular susceptibility artifact throughout cerebral sulci bilaterally, possibly with some interposed superficial siderosis potentially indicative of remote subarachnoid hemorrhage. Confluent T2 hyperintensities in the cerebral white matter bilaterally have mildly progressed from the prior MRI and are nonspecific but compatible with severe chronic small vessel ischemic disease. Milder chronic small vessel changes are present in the pons. T2 hyperintensities in the basal ganglia and thalami likely reflect a combination of dilated perivascular spaces and chronic small vessel ischemia. There is a chronic lacunar infarct in the posterior limb of the left internal capsule. There is moderate cerebral atrophy. Small chronic infarcts are again noted bilaterally in the frontal and parietal lobes. Vascular: Major intracranial vascular flow voids are preserved. Skull and upper cervical spine: Unremarkable bone marrow signal. Sinuses/Orbits: Bilateral cataract extraction. Paranasal sinuses and mastoid air cells are clear. Other: None. MRA HEAD FINDINGS Anterior circulation: The internal carotid arteries are widely patent from skull base to carotid termini. ACAs and MCAs are patent without evidence of a proximal branch occlusion or significant proximal stenosis. No aneurysm is identified. Posterior circulation: The visualized distal vertebral arteries are widely patent to the basilar and codominant. Patent PICA and SCA origins are seen bilaterally. The basilar artery is widely patent. There is a small right posterior communicating artery. Both PCAs are patent without evidence of a significant proximal stenosis. No aneurysm is identified. Anatomic variants: None. IMPRESSION: 1. No acute intracranial abnormality. 2. Severe chronic small vessel ischemic disease with multiple chronic infarcts as above. 3. Negative head MRA. Electronically Signed   By: Sebastian Ache M.D.   On: 10/17/2020 21:06   MR BRAIN WO CONTRAST  Result Date:  10/17/2020 CLINICAL DATA:  Acute neuro deficit.  Speech disturbance. EXAM: MRI HEAD WITHOUT CONTRAST MRA HEAD WITHOUT CONTRAST TECHNIQUE: Multiplanar, multi-echo pulse sequences of the brain and surrounding structures were acquired without intravenous contrast. Angiographic images of the Circle of Willis were acquired using MRA technique without intravenous contrast. COMPARISON: No pertinent prior exam. COMPARISON:  Head CT 10/17/2020.  Head MRI and MRA 04/04/2017. FINDINGS: MRI HEAD FINDINGS Brain: There is no evidence of an acute infarct, mass, midline  shift, or extra-axial fluid collection. A chronic microhemorrhage in the anterior right parietal lobe is unchanged from the prior MRI. There is prominent vascular susceptibility artifact throughout cerebral sulci bilaterally, possibly with some interposed superficial siderosis potentially indicative of remote subarachnoid hemorrhage. Confluent T2 hyperintensities in the cerebral white matter bilaterally have mildly progressed from the prior MRI and are nonspecific but compatible with severe chronic small vessel ischemic disease. Milder chronic small vessel changes are present in the pons. T2 hyperintensities in the basal ganglia and thalami likely reflect a combination of dilated perivascular spaces and chronic small vessel ischemia. There is a chronic lacunar infarct in the posterior limb of the left internal capsule. There is moderate cerebral atrophy. Small chronic infarcts are again noted bilaterally in the frontal and parietal lobes. Vascular: Major intracranial vascular flow voids are preserved. Skull and upper cervical spine: Unremarkable bone marrow signal. Sinuses/Orbits: Bilateral cataract extraction. Paranasal sinuses and mastoid air cells are clear. Other: None. MRA HEAD FINDINGS Anterior circulation: The internal carotid arteries are widely patent from skull base to carotid termini. ACAs and MCAs are patent without evidence of a proximal branch  occlusion or significant proximal stenosis. No aneurysm is identified. Posterior circulation: The visualized distal vertebral arteries are widely patent to the basilar and codominant. Patent PICA and SCA origins are seen bilaterally. The basilar artery is widely patent. There is a small right posterior communicating artery. Both PCAs are patent without evidence of a significant proximal stenosis. No aneurysm is identified. Anatomic variants: None. IMPRESSION: 1. No acute intracranial abnormality. 2. Severe chronic small vessel ischemic disease with multiple chronic infarcts as above. 3. Negative head MRA. Electronically Signed   By: Sebastian Ache M.D.   On: 10/17/2020 21:06   CT HEAD CODE STROKE WO CONTRAST  Result Date: 10/17/2020 CLINICAL DATA:  Code stroke.  Speech disturbance. EXAM: CT HEAD WITHOUT CONTRAST TECHNIQUE: Contiguous axial images were obtained from the base of the skull through the vertex without intravenous contrast. COMPARISON:  Head CT 04/03/2017 and MRI 04/04/2017 FINDINGS: Brain: There is no evidence of an acute infarct, intracranial hemorrhage, mass, midline shift, or extra-axial fluid collection. Patchy and confluent hypodensities in the cerebral white matter bilaterally have mildly progressed and are nonspecific but compatible with extensive chronic small vessel ischemic disease. Small chronic cortical infarcts are again seen in the frontal and parietal regions bilaterally. Chronic lacunar infarcts are again noted in the deep gray nuclei/deep white matter tracks. There is moderate cerebral atrophy. Vascular: Calcified atherosclerosis at the skull base. No hyperdense vessel. Skull: No fracture or suspicious osseous lesion. Sinuses/Orbits: Paranasal sinuses and mastoid air cells are clear. Bilateral cataract extraction. Other: None. ASPECTS [facility] Stroke Program Early CT Score) Not scored given the extensive chronic ischemia and current clinical history. IMPRESSION: 1. No evidence of acute  intracranial abnormality. 2. Extensive chronic small vessel ischemic disease with multiple chronic infarcts as above. These results were communicated to Dr. Derry Lory at 7:05 pm on 10/17/2020 by text page via the [facility] messaging system. Electronically Signed   By: Sebastian Ache M.D.   On: 10/17/2020 19:05    Procedures .Critical Care Performed by: Milagros Loll, MD Authorized by: Milagros Loll, MD   Critical care provider statement:    Critical care time (minutes):  42   Critical care was necessary to treat or prevent imminent or life-threatening deterioration of the following conditions:  CNS failure or compromise   Critical care was time spent personally by me on the following activities:  Discussions with consultants, evaluation of patient's response to treatment, examination of patient, ordering and performing treatments and interventions, ordering and review of laboratory studies, ordering and review of radiographic studies, pulse oximetry, re-evaluation of patient's condition, obtaining history from patient or surrogate and review of old charts     Medications Ordered in ED Medications  aspirin chewable tablet 81 mg (81 mg Oral Given 10/17/20 2134)    Or  aspirin suppository 300 mg ( Rectal See Alternative 10/17/20 2134)  clopidogrel (PLAVIX) tablet 75 mg (75 mg Oral Given 10/17/20 2134)  atorvastatin (LIPITOR) tablet 40 mg (has no administration in time range)  oxybutynin (DITROPAN-XL) 24 hr tablet 10 mg (10 mg Oral Not Given 10/17/20 2154)  enoxaparin (LOVENOX) injection 30 mg (has no administration in time range)  sodium chloride flush (NS) 0.9 % injection 3 mL (3 mLs Intravenous Given 10/17/20 2132)    ED Course  I have reviewed the triage vital signs and the nursing notes.  Pertinent labs & imaging results that were available during my care of the patient were reviewed by me and considered in my medical decision making (see chart for details).    MDM  Rules/Calculators/A&P                         87 year old lady presenting to ER as a stroke alert.  Reported sudden onset aphasia.  Symptoms improving, residual right-sided deficit from past stroke.  Initial CT imaging negative.  Neurology concern for possible TIA.  Recommend admission for TIA/stroke work-up, MRI.  Final Clinical Impression(s) / ED Diagnoses Final diagnoses:  Stroke-like symptoms    Rx / DC Orders ED Discharge Orders    None       Milagros Lollykstra, Carmalita Wakefield S, MD 10/17/20 2308

## 2020-10-18 ENCOUNTER — Observation Stay (HOSPITAL_COMMUNITY): Payer: Medicare Other

## 2020-10-18 ENCOUNTER — Observation Stay (HOSPITAL_BASED_OUTPATIENT_CLINIC_OR_DEPARTMENT_OTHER): Payer: Medicare Other

## 2020-10-18 DIAGNOSIS — I639 Cerebral infarction, unspecified: Secondary | ICD-10-CM | POA: Diagnosis not present

## 2020-10-18 DIAGNOSIS — R7303 Prediabetes: Secondary | ICD-10-CM | POA: Diagnosis not present

## 2020-10-18 DIAGNOSIS — E785 Hyperlipidemia, unspecified: Secondary | ICD-10-CM | POA: Diagnosis not present

## 2020-10-18 DIAGNOSIS — G459 Transient cerebral ischemic attack, unspecified: Secondary | ICD-10-CM | POA: Diagnosis not present

## 2020-10-18 DIAGNOSIS — I10 Essential (primary) hypertension: Secondary | ICD-10-CM | POA: Diagnosis not present

## 2020-10-18 DIAGNOSIS — I5032 Chronic diastolic (congestive) heart failure: Secondary | ICD-10-CM | POA: Diagnosis not present

## 2020-10-18 LAB — ECHOCARDIOGRAM COMPLETE
AR max vel: 2.38 cm2
AV Area VTI: 1.84 cm2
AV Area mean vel: 2.58 cm2
AV Mean grad: 2 mmHg
AV Peak grad: 3.6 mmHg
Ao pk vel: 0.95 m/s
Area-P 1/2: 2.76 cm2
MV VTI: 1.64 cm2
P 1/2 time: 309 msec
S' Lateral: 1.5 cm

## 2020-10-18 LAB — SARS CORONAVIRUS 2 (TAT 6-24 HRS): SARS Coronavirus 2: NEGATIVE

## 2020-10-18 MED ORDER — TICAGRELOR 90 MG PO TABS: 90.0000 mg | ORAL_TABLET | Freq: Two times a day (BID) | ORAL | 0 refills | Status: DC

## 2020-10-18 MED ORDER — ASPIRIN 81 MG PO CHEW: 81.0000 mg | CHEWABLE_TABLET | Freq: Every day | ORAL | 2 refills | Status: AC

## 2020-10-18 NOTE — Evaluation (Signed)
Occupational Therapy Evaluation Patient Details Name: Kelly [name] MRN: [id number] DOB: 01-09-1928 Today's Date: 10/18/2020    History of Present Illness Pt is a 87 y/o female admitted 5/26 secondary to aphasia. MRI negative. PMH includes CVA with R sided deficits, CHF, HTN, pre-diabetes, dCHF, and trigeminal neuralgia.   Clinical Impression   Patient evaluated by Occupational Therapy with no further acute OT needs identified. All education has been completed and the patient has no further questions. Pt and spouse feel they can manage at current level at home. Educated on anterior weight shift to help with transfers. Spouse reports son is an outpatient PT for [place]. See below for any follow-up Occupational Therapy or equipment needs. OT to sign off. Thank you for referral.      Follow Up Recommendations  No OT follow up    Equipment Recommendations  None recommended by OT    Recommendations for Other Services       Precautions / Restrictions Precautions Precautions: Fall Restrictions Weight Bearing Restrictions: No      Mobility Bed Mobility Overal bed mobility: Needs Assistance Bed Mobility: Supine to Sit     Supine to sit: Mod assist     General bed mobility comments: Mod A to assist with scooting hips to EOB.    Transfers Overall transfer level: Needs assistance Equipment used: 1 person hand held assist Transfers: Sit to/from [employer] Sit to Stand: Mod assist;+2 physical assistance Stand pivot transfers: Mod assist;+2 physical assistance       General transfer comment: Mod A +2 for lift assist and steadying to stand and transfer to chair. Pt walking on tip toes and with posterior lean. Cues to put heels on ground during transfers.    Balance Overall balance assessment: Needs assistance Sitting-balance support: No upper extremity supported;Feet supported Sitting balance-Leahy Scale: Fair     Standing balance support: Single  extremity supported;During functional activity Standing balance-Leahy Scale: Poor Standing balance comment: Reliant on BUE support                           ADL either performed or assessed with clinical judgement   ADL Overall ADL's : Needs assistance/impaired Eating/Feeding: Minimal assistance;Sitting Eating/Feeding Details (indicate cue type and reason): due to R contracture requires meals prep by spouse or caregiver                     Toilet Transfer: +2 for physical assistance;Moderate assistance                   Vision Baseline Vision/History: Wears glasses Wears Glasses: At all times       Perception     Praxis      Pertinent Vitals/Pain Pain Assessment: No/denies pain     Hand Dominance Right   Extremity/Trunk Assessment Upper Extremity Assessment Upper Extremity Assessment: Generalized weakness;RUE deficits/detail RUE Deficits / Details: contracted wrist flexion digits flexion and elbow flexion. pt with space to open hand without pressure on palm. pt bruised on extremity at this time. pt reports not braces prior to admission   Lower Extremity Assessment Lower Extremity Assessment: Defer to PT evaluation RLE Deficits / Details: RLE weakness at baseline. Was able to perform LAW and hip flexion in sitting. Pt with decreased DF strength.   Cervical / Trunk Assessment Cervical / Trunk Assessment: Kyphotic   Communication Communication Communication: No difficulties   Cognition Arousal/Alertness: Awake/alert Behavior During Therapy: WFL for  tasks assessed/performed Overall Cognitive Status: Within Functional Limits for tasks assessed                                 General Comments: [facility] for basic tasks. Cognition at baseline   General Comments  daughter and spouse present for all education/ session. spouse feels they can manage at current level at home. Pt and spouse have hired caregivers that can be increased     Exercises     Shoulder Instructions      Home Living Family/patient expects to be discharged to:: Private residence Living Arrangements: Spouse/significant other Available Help at Discharge: Family;Personal care attendant;Available 24 hours/day Type of Home: House Home Access: Ramped entrance     Home Layout: One level         Bathroom Toilet: Handicapped height     Home Equipment: Bedside commode;Wheelchair - manual          Prior Functioning/Environment Level of Independence: Needs assistance  Gait / Transfers Assistance Needed: Reports aide and husband assists with transfers to/from [facility]. ADL's / Homemaking Assistance Needed: Reports aide assists with ADL tasks. Pt's husband cooks.            OT Problem List: Impaired balance (sitting and/or standing);Decreased activity tolerance      OT Treatment/Interventions:      OT Goals(Current goals can be found in the care plan section) Acute Rehab OT Goals Patient Stated Goal: to go home OT Goal Formulation: With patient/family Potential to Achieve Goals: Good  OT Frequency:     Barriers to D/C:            Co-evaluation PT/OT/SLP Co-Evaluation/Treatment: Yes Reason for Co-Treatment: Complexity of the patient's impairments (multi-system involvement);For patient/therapist safety;Necessary to address cognition/behavior during functional activity PT goals addressed during session: Mobility/safety with mobility;Balance OT goals addressed during session: ADL's and self-care;Proper use of Adaptive equipment and DME;Strengthening/ROM      AM-PAC OT "6 Clicks" Daily Activity     Outcome Measure Help from another person eating meals?: A Little Help from another person taking care of personal grooming?: A Lot Help from another person toileting, which includes using toliet, bedpan, or urinal?: A Lot Help from another person bathing (including washing, rinsing, drying)?: A Lot Help from another person to put on and  taking off regular upper body clothing?: A Lot Help from another person to put on and taking off regular lower body clothing?: A Lot 6 Click Score: 13   End of Session Equipment Utilized During Treatment: Gait belt Nurse Communication: Mobility status;Precautions  Activity Tolerance: Patient tolerated treatment well Patient left: in chair;with call bell/phone within reach;with chair alarm set;with family/visitor present  OT Visit Diagnosis: Unsteadiness on feet (R26.81);Muscle weakness (generalized) (M62.81)                Time: 3474-2595 OT Time Calculation (min): 24 min Charges:  OT General Charges $OT Visit: 1 Visit OT Evaluation $OT Eval Moderate Complexity: 1 Mod   Brynn, OTR/L  Acute Rehabilitation Services Pager: [phone] Office: [phone] .   Mateo Flow 10/18/2020, 3:29 PM

## 2020-10-18 NOTE — Progress Notes (Signed)
EEG complete - results pending 

## 2020-10-18 NOTE — TOC Transition Note (Addendum)
Transition of Care [facility]) - CM/SW Discharge Note   Patient Details  Name: Kelly [name] MRN: [id number] Date of Birth: 1928-02-09  Transition of Care [facility]) CM/SW Contact:  Kermit Balo, RN Phone Number: 10/18/2020, 2:42 PM   Clinical Narrative:    Patient is from home with spouse. She has a caregiver in am and pm to assist with bathing, dressing and getting ready for bed. She has all needed DME. No new needs per PT/OT.  CM discussed with spouse adding to caregivers to give him time out of the home and increased assistance. He is going to think this over.  No issues with home medications or transportation. Brilinta coupon provided to the pts spouse. Pt has transport home today.   Final next level of care: Home/Self Care Barriers to Discharge: No Barriers Identified   Patient Goals and CMS Choice        Discharge Placement                       Discharge Plan and Services                                     Social Determinants of Health (SDOH) Interventions     Readmission Risk Interventions No flowsheet data found.

## 2020-10-18 NOTE — Procedures (Signed)
Patient Name: Kelly [name]  MRN: [id number]  Epilepsy Attending: Charlsie Quest  Referring Physician/Provider: Dr. John Giovanni Date: 10/18/2020 Duration: 29.06 minutes  Patient history: 87 year old female with prior stroke and residual right-sided weakness who presented with aphasia.  EEG done for seizures.  Level of alertness: Awake  AEDs during EEG study: None  Technical aspects: This EEG study was done with scalp electrodes positioned according to the 10-20 International system of electrode placement. Electrical activity was acquired at a sampling rate of 500Hz  and reviewed with a high frequency filter of 70Hz  and a low frequency filter of 1Hz . EEG data were recorded continuously and digitally stored.   Description: The posterior dominant rhythm consists of 9Hz  alpha  Hz activity of moderate voltage (25-35 uV) seen predominantly in posterior head regions, symmetric and reactive to eye opening and eye closing. EEG showed intermittent generalized and maximal left frontotemporal region 3 to 6 Hz theta-delta slowing. Physiologic photic driving was not seen during photic stimulation.  Hyperventilation was not performed.     ABNORMALITY - Intermittent slow, generalized and maximal left frontotemporal region  IMPRESSION: This study is suggestive of nonspecific cortical dysfunction in left frontotemporal region as well as mild diffuse encephalopathy, nonspecific to etiology.  No seizures or definite epileptiform discharges were seen throughout the recording.  Kelly [name] 

## 2020-10-18 NOTE — Progress Notes (Signed)
STROKE TEAM PROGRESS NOTE   INTERVAL HISTORY No visitors. Poor historian. Disoriented to date, time.  Reports trouble talking yesterday "it was all confused". She is not able to quantify or explain further. She reports she had a stroke previously but is unable to recall how long ago or the circumstances.  She reports she needs help changing clothes and bathing but walks independently.  We discussed her diagnosis, work up and plan of care.   MRI scan is negative for acute stroke.  MRI of the brain shows no large vessel stenosis or occlusion.  Echocardiogram and carotid Dopplers are pending.  She states she was quite compliant with taking Plavix every day Vitals:   10/17/20 2245 10/17/20 2316 10/18/20 0351 10/18/20 0736  BP: (!) 149/71 (!) 147/71 (!) 127/100 130/72  Pulse: 72 70 78 76  Resp: 18 16 18 17   Temp:  98.4 F (36.9 C) 98 F (36.7 C) 98.2 F (36.8 C)  TempSrc:  Oral Oral Oral  SpO2: 97% 99% 94% 95%   CBC:  Recent Labs  Lab 10/17/20 1845 10/17/20 1857  WBC 8.7  --   NEUTROABS 5.3  --   HGB 13.0 13.3  HCT 39.1 39.0  MCV 90.3  --   PLT 228  --    Basic Metabolic Panel:  Recent Labs  Lab 10/17/20 1845 10/17/20 1857  NA 135 138  K 4.5 4.6  CL 100 99  CO2 27  --   GLUCOSE 103* 103*  BUN 23 26*  CREATININE 0.62 0.60  CALCIUM 9.1  --   Hello yes because with me and wanted to go from my account with cardiac cath, respectively at the his  lipid Panel:  Recent Labs  Lab 10/17/20 1955  CHOL 110  TRIG 69  HDL 37*  CHOLHDL 3.0  VLDL 14  LDLCALC 59   HgbA1c: No results for input(s): HGBA1C in the last 168 hours. Urine Drug Screen: No results for input(s): LABOPIA, COCAINSCRNUR, LABBENZ, AMPHETMU, THCU, LABBARB in the last 168 hours.  Alcohol Level No results for input(s): ETH in the last 168 hours.  IMAGING past 24 hours MR ANGIO HEAD WO CONTRAST  Result Date: 10/17/2020 CLINICAL DATA:  Acute neuro deficit.  Speech disturbance. EXAM: MRI HEAD WITHOUT CONTRAST  MRA HEAD WITHOUT CONTRAST TECHNIQUE: Multiplanar, multi-echo pulse sequences of the brain and surrounding structures were acquired without intravenous contrast. Angiographic images of the Circle of Willis were acquired using MRA technique without intravenous contrast. COMPARISON: No pertinent prior exam. COMPARISON:  Head CT 10/17/2020.  Head MRI and MRA 04/04/2017. FINDINGS: MRI HEAD FINDINGS Brain: There is no evidence of an acute infarct, mass, midline shift, or extra-axial fluid collection. A chronic microhemorrhage in the anterior right parietal lobe is unchanged from the prior MRI. There is prominent vascular susceptibility artifact throughout cerebral sulci bilaterally, possibly with some interposed superficial siderosis potentially indicative of remote subarachnoid hemorrhage. Confluent T2 hyperintensities in the cerebral white matter bilaterally have mildly progressed from the prior MRI and are nonspecific but compatible with severe chronic small vessel ischemic disease. Milder chronic small vessel changes are present in the pons. T2 hyperintensities in the basal ganglia and thalami likely reflect a combination of dilated perivascular spaces and chronic small vessel ischemia. There is a chronic lacunar infarct in the posterior limb of the left internal capsule. There is moderate cerebral atrophy. Small chronic infarcts are again noted bilaterally in the frontal and parietal lobes. Vascular: Major intracranial vascular flow voids are preserved. Skull and  upper cervical spine: Unremarkable bone marrow signal. Sinuses/Orbits: Bilateral cataract extraction. Paranasal sinuses and mastoid air cells are clear. Other: None. MRA HEAD FINDINGS Anterior circulation: The internal carotid arteries are widely patent from skull base to carotid termini. ACAs and MCAs are patent without evidence of a proximal branch occlusion or significant proximal stenosis. No aneurysm is identified. Posterior circulation: The visualized  distal vertebral arteries are widely patent to the basilar and codominant. Patent PICA and SCA origins are seen bilaterally. The basilar artery is widely patent. There is a small right posterior communicating artery. Both PCAs are patent without evidence of a significant proximal stenosis. No aneurysm is identified. Anatomic variants: None. IMPRESSION: 1. No acute intracranial abnormality. 2. Severe chronic small vessel ischemic disease with multiple chronic infarcts as above. 3. Negative head MRA. Electronically Signed   By: Sebastian AcheAllen  Grady M.D.   On: 10/17/2020 21:06   MR BRAIN WO CONTRAST  Result Date: 10/17/2020 CLINICAL DATA:  Acute neuro deficit.  Speech disturbance. EXAM: MRI HEAD WITHOUT CONTRAST MRA HEAD WITHOUT CONTRAST TECHNIQUE: Multiplanar, multi-echo pulse sequences of the brain and surrounding structures were acquired without intravenous contrast. Angiographic images of the Circle of Willis were acquired using MRA technique without intravenous contrast. COMPARISON: No pertinent prior exam. COMPARISON:  Head CT 10/17/2020.  Head MRI and MRA 04/04/2017. FINDINGS: MRI HEAD FINDINGS Brain: There is no evidence of an acute infarct, mass, midline shift, or extra-axial fluid collection. A chronic microhemorrhage in the anterior right parietal lobe is unchanged from the prior MRI. There is prominent vascular susceptibility artifact throughout cerebral sulci bilaterally, possibly with some interposed superficial siderosis potentially indicative of remote subarachnoid hemorrhage. Confluent T2 hyperintensities in the cerebral white matter bilaterally have mildly progressed from the prior MRI and are nonspecific but compatible with severe chronic small vessel ischemic disease. Milder chronic small vessel changes are present in the pons. T2 hyperintensities in the basal ganglia and thalami likely reflect a combination of dilated perivascular spaces and chronic small vessel ischemia. There is a chronic lacunar  infarct in the posterior limb of the left internal capsule. There is moderate cerebral atrophy. Small chronic infarcts are again noted bilaterally in the frontal and parietal lobes. Vascular: Major intracranial vascular flow voids are preserved. Skull and upper cervical spine: Unremarkable bone marrow signal. Sinuses/Orbits: Bilateral cataract extraction. Paranasal sinuses and mastoid air cells are clear. Other: None. MRA HEAD FINDINGS Anterior circulation: The internal carotid arteries are widely patent from skull base to carotid termini. ACAs and MCAs are patent without evidence of a proximal branch occlusion or significant proximal stenosis. No aneurysm is identified. Posterior circulation: The visualized distal vertebral arteries are widely patent to the basilar and codominant. Patent PICA and SCA origins are seen bilaterally. The basilar artery is widely patent. There is a small right posterior communicating artery. Both PCAs are patent without evidence of a significant proximal stenosis. No aneurysm is identified. Anatomic variants: None. IMPRESSION: 1. No acute intracranial abnormality. 2. Severe chronic small vessel ischemic disease with multiple chronic infarcts as above. 3. Negative head MRA. Electronically Signed   By: Sebastian AcheAllen  Grady M.D.   On: 10/17/2020 21:06   CT HEAD CODE STROKE WO CONTRAST  Result Date: 10/17/2020 CLINICAL DATA:  Code stroke.  Speech disturbance. EXAM: CT HEAD WITHOUT CONTRAST TECHNIQUE: Contiguous axial images were obtained from the base of the skull through the vertex without intravenous contrast. COMPARISON:  Head CT 04/03/2017 and MRI 04/04/2017 FINDINGS: Brain: There is no evidence of an acute infarct, intracranial  hemorrhage, mass, midline shift, or extra-axial fluid collection. Patchy and confluent hypodensities in the cerebral white matter bilaterally have mildly progressed and are nonspecific but compatible with extensive chronic small vessel ischemic disease. Small  chronic cortical infarcts are again seen in the frontal and parietal regions bilaterally. Chronic lacunar infarcts are again noted in the deep gray nuclei/deep white matter tracks. There is moderate cerebral atrophy. Vascular: Calcified atherosclerosis at the skull base. No hyperdense vessel. Skull: No fracture or suspicious osseous lesion. Sinuses/Orbits: Paranasal sinuses and mastoid air cells are clear. Bilateral cataract extraction. Other: None. ASPECTS [facility] Stroke Program Early CT Score) Not scored given the extensive chronic ischemia and current clinical history. IMPRESSION: 1. No evidence of acute intracranial abnormality. 2. Extensive chronic small vessel ischemic disease with multiple chronic infarcts as above. These results were communicated to Dr. Derry Lory at 7:05 pm on 10/17/2020 by text page via the [facility] messaging system. Electronically Signed   By: Sebastian Ache M.D.   On: 10/17/2020 19:05   PHYSICAL EXAM  General: Laying comfortably in bed; in no acute distress. HENT: Normal oropharynx and mucosa. Normal external appearance of ears and nose. Neck: Supple, no pain or tenderness CV: No JVD. No peripheral edema.  Pulmonary: Symmetric Chest rise. Normal respiratory effort.  Abdomen: Soft to touch, non-tender.  Ext: No cyanosis, edema, or deformity  Skin: No rash. Normal palpation of skin.   Musculoskeletal: Normal digits and nails by inspection. No clubbing.   Neurologic Examination  Mental status/Cognition: Alert, oriented to self, place, but not to month and year, good attention.  Diminished attention, registration and recall. Speech/language: soft, fluent, comprehension intact, object naming intact to most but not all objects, repetition intact.  Cranial nerves:   CN II Pupils equal and reactive to light, no VF deficits    CN III,IV,VI EOM intact, no gaze preference or deviation, no nystagmus    CN V normal sensation in V1, V2, and V3 segments bilaterally    CN VII no  asymmetry, no nasolabial fold flattening    CN VIII normal hearing to speech    CN IX & X normal palatal elevation, no uvular deviation    CN XI 5/5 head turn and 5/5 shoulder shrug bilaterally    CN XII midline tongue protrusion      ASSESSMENT/PLAN [name] is a 87 y.o. female with history of prior stroke with residual right hemiplegia, essential tremor, hypertension, hyperlipidemia, CHF and prediabetes presenting with transient episode of expressive aphasia likely left hemispheric TIA.  Transient aphasia episode likely left hemispheric TIA versus simple partial seizure  Code Stroke CTH Generalized cerebral atrophy  CTA head & neck  No LVO or high grade stenosis. Mild atheromatous plaque about the left carotid bifurcation without significant stenosis. No significant atheromatous disease within the right carotid artery system. Mild atheromatous plaque within the pre foraminal left V1 segment with resultant mild stenosis  MRI  No acute abnormality  MRA  No acute intracranial abnormality.  Carotid Doppler Pending  2D Echo Pending  LDL 59  HgbA1c: sendout d/t lab difficulties  VTE prophylaxis - SCDs    Diet   Diet Heart Room service appropriate? Yes; Fluid consistency: Thin     On Plavix 75mg  daily, reports compliance  Recommend ASA 81mg  with Brilinta 90 mg BID x 4 weeks then ASA 81mg  alone.   Therapy recommendations: cleared for home, no needs,has a caregiver am and pm already   Disposition:  Home   Hypertension  Stable .  Long-term BP goal normotensive  Hyperlipidemia  Home meds:  Lipitor 40mg    LDL 59, at goal < 70  High intensity statin not indicated as at goal  Continue statin at discharge  Other Stroke Risk Factors  Advanced Age >/= 34   Current ETOH use  Hx stroke/TIA  Diastolic heart failure  Other Active Problems     Hospital day # 0  I have personally obtained history,examined this patient, reviewed notes, independently  viewed imaging studies, participated in medical decision making and plan of care.ROS completed by me personally and pertinent positives fully documented  I have made any additions or clarifications directly to the above note. Agree with note above.  She presented with transient episode of expressive aphasia possible TIA versus simple partial seizure given her prior history of stroke on that side.76  MRI is negative for acute stroke.  Recommend check EEG.  Aspirin and Brilinta for 4 weeks followed by aspirin alone and aggressive risk factor modification.  Discussed with Dr. Marland Kitchen.  Greater than 50% time during this 35-minute visit was spent in counseling and coordination of care in about a TIA and prior stroke and possibility of seizure and answering questions Catha Gosselin, MD Medical Director [facility] Pager: [phone] 10/18/2020 3:16 PM   To contact Stroke Continuity provider, please refer to 10/20/2020. After hours, contact General Neurology

## 2020-10-18 NOTE — Progress Notes (Signed)
Carotid artery duplex completed. Refer to "CV Proc" under chart review to view preliminary results.  10/18/2020 1:40 PM Eula Fried., MHA, RVT, RDCS, RDMS

## 2020-10-18 NOTE — Care Management Obs Status (Signed)
MEDICARE OBSERVATION STATUS NOTIFICATION   Patient Details  Name: Kelly [name] MRN: [id number] Date of Birth: [birth year]   Medicare Observation Status Notification Given:  Yes    Kermit Balo, RN 10/18/2020, 2:21 PM

## 2020-10-18 NOTE — Evaluation (Signed)
Physical Therapy Evaluation Patient Details Name: Kelly [name] MRN: [id number] DOB: April 15, 1928 Today's Date: 10/18/2020   History of Present Illness  Pt is a 87 y/o female admitted 5/26 secondary to aphasia. MRI negative. PMH includes CVA with R sided deficits, CHF, HTN, pre-diabetes, dCHF, and trigeminal neuralgia.  Clinical Impression  Pt admitted secondary to problem above with deficits below. Pt requiring mod A +2 to stand and transfer this session. Pt with right sided deficits at baseline. Tended to walk on toes when taking steps to chair. Per pt and husband, pt close to baseline with mobility. Reports he is able to provide assist and has aides that come in the morning and night to help with morning and night time routines. Since pt close to baseline, not currently recommending PT follow up at this time. Will continue to follow acutely.     Follow Up Recommendations No PT follow up;Supervision/Assistance - 24 hour    Equipment Recommendations  None recommended by PT    Recommendations for Other Services       Precautions / Restrictions Precautions Precautions: Fall Restrictions Weight Bearing Restrictions: No      Mobility  Bed Mobility Overal bed mobility: Needs Assistance Bed Mobility: Supine to Sit     Supine to sit: Mod assist     General bed mobility comments: Mod A to assist with scooting hips to EOB.    Transfers Overall transfer level: Needs assistance Equipment used: 1 person hand held assist Transfers: Sit to/from [employer] Sit to Stand: Mod assist;+2 physical assistance Stand pivot transfers: Mod assist;+2 physical assistance       General transfer comment: Mod A +2 for lift assist and steadying to stand and transfer to chair. Pt walking on tip toes and with posterior lean. Cues to put heels on ground during transfers.  Ambulation/Gait                Stairs            Wheelchair Mobility    Modified Rankin (Stroke  Patients Only)       Balance Overall balance assessment: Needs assistance Sitting-balance support: No upper extremity supported;Feet supported Sitting balance-Leahy Scale: Fair     Standing balance support: Single extremity supported;During functional activity Standing balance-Leahy Scale: Poor Standing balance comment: Reliant on BUE support                             Pertinent Vitals/Pain Pain Assessment: No/denies pain    Home Living Family/patient expects to be discharged to:: Private residence Living Arrangements: Spouse/significant other Available Help at Discharge: Family;Personal care attendant;Available 24 hours/day Type of Home: House Home Access: Ramped entrance     Home Layout: One level Home Equipment: Bedside commode;Wheelchair - manual      Prior Function Level of Independence: Needs assistance   Gait / Transfers Assistance Needed: Reports aide and husband assists with transfers to/from [facility].  ADL's / Homemaking Assistance Needed: Reports aide assists with ADL tasks. Pt's husband cooks.        Hand Dominance        Extremity/Trunk Assessment   Upper Extremity Assessment Upper Extremity Assessment: Defer to OT evaluation    Lower Extremity Assessment Lower Extremity Assessment: RLE deficits/detail RLE Deficits / Details: RLE weakness at baseline. Was able to perform LAW and hip flexion in sitting. Pt with decreased DF strength.    Cervical / Trunk Assessment Cervical / Trunk Assessment:  Kyphotic  Communication   Communication: No difficulties  Cognition Arousal/Alertness: Awake/alert Behavior During Therapy: WFL for tasks assessed/performed Overall Cognitive Status: Within Functional Limits for tasks assessed                                 General Comments: WFL for basic tasks. Cognition at baseline      General Comments General comments (skin integrity, edema, etc.): Pt's husband and daughter present during  session    Exercises     Assessment/Plan    PT Assessment Patient needs continued PT services  PT Problem List Decreased strength;Decreased balance;Decreased mobility;Decreased knowledge of use of DME;Decreased knowledge of precautions       PT Treatment Interventions DME instruction;Functional mobility training;Therapeutic activities;Therapeutic exercise;Balance training;Neuromuscular re-education;Wheelchair mobility training    PT Goals (Current goals can be found in the Care Plan section)  Acute Rehab PT Goals Patient Stated Goal: to go home PT Goal Formulation: With patient/family Time For Goal Achievement: 11/01/20 Potential to Achieve Goals: Good    Frequency Min 3X/week   Barriers to discharge        Co-evaluation PT/OT/SLP Co-Evaluation/Treatment: Yes Reason for Co-Treatment: Complexity of the patient's impairments (multi-system involvement);For patient/therapist safety;To address functional/ADL transfers PT goals addressed during session: Mobility/safety with mobility;Balance         AM-PAC PT "6 Clicks" Mobility  Outcome Measure Help needed turning from your back to your side while in a flat bed without using bedrails?: A Little Help needed moving from lying on your back to sitting on the side of a flat bed without using bedrails?: A Lot Help needed moving to and from a bed to a chair (including a wheelchair)?: A Lot Help needed standing up from a chair using your arms (e.g., wheelchair or bedside chair)?: A Lot Help needed to walk in hospital room?: Total Help needed climbing 3-5 steps with a railing? : Total 6 Click Score: 11    End of Session Equipment Utilized During Treatment: Gait belt Activity Tolerance: Patient tolerated treatment well Patient left: in chair;with call bell/phone within reach;with chair alarm set Nurse Communication: Mobility status PT Visit Diagnosis: Other abnormalities of gait and mobility (R26.89)    Time: 9470-9628 PT Time  Calculation (min) (ACUTE ONLY): 36 min   Charges:   PT Evaluation $PT Eval Moderate Complexity: 1 Mod          Farley Ly, PT, DPT  Acute Rehabilitation Services  Pager: [phone] Office: [phone]   Lehman Prom 10/18/2020, 2:12 PM

## 2020-10-18 NOTE — Progress Notes (Signed)
  Echocardiogram 2D Echocardiogram has been performed.  Gerda Diss 10/18/2020, 3:19 PM

## 2020-10-18 NOTE — Discharge Summary (Signed)
Physician Discharge Summary  MAELIN KURKOWSKI WHQ:[id number] DOB: 1928/03/05 DOA: 10/17/2020  PCP: Gaspar Garbe, MD  Admit date: 10/17/2020 Discharge date: 10/18/2020  Time spent: 45 minutes  Recommendations for Outpatient Follow-up:  Patient will be discharged to home.  Patient will need to follow up with primary care provider within one week of discharge.  Patient should continue medications as prescribed.  Follow up neurology, Dr. Pearlean Brownie. Patient should follow a heart healthy diet.    Discharge Diagnoses:  TIA  Essential hypertension Hyperlipidemia Prediabetes Chronic diastolic heart failure Overactive bladder  Discharge Condition: Stable  Diet recommendation: heart healthy  There were no vitals filed for this visit.  History of present illness:  On 10/17/2020 by Dr. Juliann Pares Bollig is a 87 y.o. female with medical history significant of prior stroke with residual right-sided weakness, hypertension, hyperlipidemia, prediabetes, essential tremor, chronic diastolic CHF presented to the ED via EMS as code stroke for evaluation of aphasia which was improving in route.  tPA was not given due to advanced age and improving symptoms.  Head CT negative for acute intracranial abnormality.  Hemodynamically stable.  Labs showing WBC 8.7, hemoglobin 13.0, platelet count 228K.  I-STAT chemistry showing sodium 138, potassium 4.6, chloride 99, bicarb 27, BUN 26, creatinine 0.6, glucose 103.  CMP pending.  INR 1.0. Neurology ordered aspirin and Plavix.  MRI brain and MRA head ordered.   Patient is hard of hearing.  History provided mostly by husband at bedside who states that around 5:30 PM while he was talking to the patient he noticed that her speech became garbled all of a sudden and she was not able to express what she had to say.  He did not notice any drooping of her face.  States she has had 2 prior strokes which have left her with weakness in her right arm and leg.  She  uses a wheelchair to ambulate.  She is not able to use her right arm at all at baseline.  Husband feels that her weakness has not changed and is at baseline.  He did not notice any left-sided weakness.  States otherwise she has been in her usual state of health.  No fevers, cough, shortness of breath, chest pain, nausea, vomiting, abdominal pain, diarrhea, or dysuria.  Hospital Course:  TIA  -Patient presented with aphasia which has improved -CT head showed no evidence of acute intercranial normality.  Extensive chronic small vessel ischemic disease with multiple chronic infarcts. -MRI brain: no acute intercranial normality. -MRA head: Negative  -Echocardiogram EF 60-65%, no RWMA, G1DD. Mild MV and AV regurgitation. No intracardiac source of embolism -Carotid Doppler: Extracranial vessels were near normal with only minimal thickening of plaque bilaterally.  Bilateral vertebral arteries demonstrate antegrade flow -LDL 59, hemoglobin A1c pending (can be followed by PCP) -Neurology consulted and appreciated, recommended aspirin with Brilinta for 4 weeks then aspirin alone thereafter.  Recommended EEG -EEG: Suggestive of nonspecific cortical dysfunction on left frontotemporal region as well as mild diffuse encephalopathy, nonspecific etiology.  No seizures or definite left form discharges seen throughout recording. -Continue statin -PT and OT evaluated patient, no further therapy needed  Essential hypertension -Continue home medications on discharge  Hyperlipidemia -Continue statin  Chronic diastolic heart failure -Echocardiogram as above -Currently no volume overload at this time  Overactive bladder -Continue oxybutynin   Procedures: Echocardiogram Carotid Doppler EEG  Consultations: Neurology  CODE STATUS DNR  Discharge Exam: Vitals:   10/18/20 0736 10/18/20 1149  BP: 130/72 133/71  Pulse: 76 74  Resp: 17 18  Temp: 98.2 F (36.8 C) 98.1 F (36.7 C)  SpO2: 95% 93%      General: Well developed, elderly, chronically ill-appearing, NAD  HEENT: NCAT,  mucous membranes moist.  Cardiovascular: S1 S2 auscultated, RRR.  Respiratory: Clear to auscultation bilaterally with equal chest rise  Abdomen: Soft, nontender, nondistended, + bowel sounds  Extremities: warm dry without cyanosis clubbing.  Lower extremity edema bilaterally.  RUE mildly contracted  Neuro: Awake and alert, speech coherent, nonfocal  Psych: pleasant  Discharge Instructions Discharge Instructions    Ambulatory referral to Neurology   Complete by: As directed    An appointment is requested in approximately: 4-6 weeks   Diet - low sodium heart healthy   Complete by: As directed    Discharge instructions   Complete by: As directed    Patient will be discharged to home.  Patient will need to follow up with primary care provider within one week of discharge.  Patient should continue medications as prescribed.  Follow up neurology, Dr. Pearlean Brownie. Patient should follow a heart healthy diet.   Increase activity slowly   Complete by: As directed      Allergies as of 10/18/2020   No Known Allergies     Medication List    STOP taking these medications   clopidogrel 75 MG tablet Commonly known as: PLAVIX   diclofenac sodium 1 % Gel Commonly known as: Voltaren   topiramate 50 MG tablet Commonly known as: TOPAMAX     TAKE these medications   acetaminophen 500 MG tablet Commonly known as: TYLENOL Take 500 mg by mouth every 6 (six) hours as needed for moderate pain.   aspirin 81 MG chewable tablet Chew 1 tablet (81 mg total) by mouth daily. Start taking on: Oct 19, 2020   atorvastatin 40 MG tablet Commonly known as: LIPITOR Take 1 tablet (40 mg total) daily at 6 PM by mouth.   diphenhydrAMINE 25 mg capsule Commonly known as: BENADRYL Take 25 mg by mouth every 6 (six) hours as needed for sleep.   oxybutynin 10 MG 24 hr tablet Commonly known as: DITROPAN-XL Take 10 mg by  mouth daily. What changed: Another medication with the same name was removed. Continue taking this medication, and follow the directions you see here.   SYSTANE COMPLETE OP Place 1 drop into both eyes at bedtime.   ticagrelor 90 MG Tabs tablet Commonly known as: Brilinta Take 1 tablet (90 mg total) by mouth 2 (two) times daily.      No Known Allergies    The results of significant diagnostics from this hospitalization (including imaging, microbiology, ancillary and laboratory) are listed below for reference.    Significant Diagnostic Studies: MR ANGIO HEAD WO CONTRAST  Result Date: 10/17/2020 CLINICAL DATA:  Acute neuro deficit.  Speech disturbance. EXAM: MRI HEAD WITHOUT CONTRAST MRA HEAD WITHOUT CONTRAST TECHNIQUE: Multiplanar, multi-echo pulse sequences of the brain and surrounding structures were acquired without intravenous contrast. Angiographic images of the Circle of Willis were acquired using MRA technique without intravenous contrast. COMPARISON: No pertinent prior exam. COMPARISON:  Head CT 10/17/2020.  Head MRI and MRA 04/04/2017. FINDINGS: MRI HEAD FINDINGS Brain: There is no evidence of an acute infarct, mass, midline shift, or extra-axial fluid collection. A chronic microhemorrhage in the anterior right parietal lobe is unchanged from the prior MRI. There is prominent vascular susceptibility artifact throughout cerebral sulci bilaterally, possibly with some interposed superficial siderosis potentially indicative of remote subarachnoid hemorrhage.  Confluent T2 hyperintensities in the cerebral white matter bilaterally have mildly progressed from the prior MRI and are nonspecific but compatible with severe chronic small vessel ischemic disease. Milder chronic small vessel changes are present in the pons. T2 hyperintensities in the basal ganglia and thalami likely reflect a combination of dilated perivascular spaces and chronic small vessel ischemia. There is a chronic lacunar infarct  in the posterior limb of the left internal capsule. There is moderate cerebral atrophy. Small chronic infarcts are again noted bilaterally in the frontal and parietal lobes. Vascular: Major intracranial vascular flow voids are preserved. Skull and upper cervical spine: Unremarkable bone marrow signal. Sinuses/Orbits: Bilateral cataract extraction. Paranasal sinuses and mastoid air cells are clear. Other: None. MRA HEAD FINDINGS Anterior circulation: The internal carotid arteries are widely patent from skull base to carotid termini. ACAs and MCAs are patent without evidence of a proximal branch occlusion or significant proximal stenosis. No aneurysm is identified. Posterior circulation: The visualized distal vertebral arteries are widely patent to the basilar and codominant. Patent PICA and SCA origins are seen bilaterally. The basilar artery is widely patent. There is a small right posterior communicating artery. Both PCAs are patent without evidence of a significant proximal stenosis. No aneurysm is identified. Anatomic variants: None. IMPRESSION: 1. No acute intracranial abnormality. 2. Severe chronic small vessel ischemic disease with multiple chronic infarcts as above. 3. Negative head MRA. Electronically Signed   By: Sebastian Ache M.D.   On: 10/17/2020 21:06   MR BRAIN WO CONTRAST  Result Date: 10/17/2020 CLINICAL DATA:  Acute neuro deficit.  Speech disturbance. EXAM: MRI HEAD WITHOUT CONTRAST MRA HEAD WITHOUT CONTRAST TECHNIQUE: Multiplanar, multi-echo pulse sequences of the brain and surrounding structures were acquired without intravenous contrast. Angiographic images of the Circle of Willis were acquired using MRA technique without intravenous contrast. COMPARISON: No pertinent prior exam. COMPARISON:  Head CT 10/17/2020.  Head MRI and MRA 04/04/2017. FINDINGS: MRI HEAD FINDINGS Brain: There is no evidence of an acute infarct, mass, midline shift, or extra-axial fluid collection. A chronic  microhemorrhage in the anterior right parietal lobe is unchanged from the prior MRI. There is prominent vascular susceptibility artifact throughout cerebral sulci bilaterally, possibly with some interposed superficial siderosis potentially indicative of remote subarachnoid hemorrhage. Confluent T2 hyperintensities in the cerebral white matter bilaterally have mildly progressed from the prior MRI and are nonspecific but compatible with severe chronic small vessel ischemic disease. Milder chronic small vessel changes are present in the pons. T2 hyperintensities in the basal ganglia and thalami likely reflect a combination of dilated perivascular spaces and chronic small vessel ischemia. There is a chronic lacunar infarct in the posterior limb of the left internal capsule. There is moderate cerebral atrophy. Small chronic infarcts are again noted bilaterally in the frontal and parietal lobes. Vascular: Major intracranial vascular flow voids are preserved. Skull and upper cervical spine: Unremarkable bone marrow signal. Sinuses/Orbits: Bilateral cataract extraction. Paranasal sinuses and mastoid air cells are clear. Other: None. MRA HEAD FINDINGS Anterior circulation: The internal carotid arteries are widely patent from skull base to carotid termini. ACAs and MCAs are patent without evidence of a proximal branch occlusion or significant proximal stenosis. No aneurysm is identified. Posterior circulation: The visualized distal vertebral arteries are widely patent to the basilar and codominant. Patent PICA and SCA origins are seen bilaterally. The basilar artery is widely patent. There is a small right posterior communicating artery. Both PCAs are patent without evidence of a significant proximal stenosis. No aneurysm is identified.  Anatomic variants: None. IMPRESSION: 1. No acute intracranial abnormality. 2. Severe chronic small vessel ischemic disease with multiple chronic infarcts as above. 3. Negative head MRA.  Electronically Signed   By: Sebastian Ache M.D.   On: 10/17/2020 21:06   EEG adult  Result Date: 10/18/2020 Charlsie Quest, MD     10/18/2020 12:24 PM Patient Name: Kelly [name] MRN: [id number] Epilepsy Attending: Charlsie Quest Referring Physician/Provider: Dr. John Giovanni Date: 10/18/2020 Duration: 29.06 minutes Patient history: 87 year old female with prior stroke and residual right-sided weakness who presented with aphasia.  EEG done for seizures. Level of alertness: Awake AEDs during EEG study: None Technical aspects: This EEG study was done with scalp electrodes positioned according to the 10-20 International system of electrode placement. Electrical activity was acquired at a sampling rate of  and reviewed with a high frequency filter of  and a low frequency filter of . EEG data were recorded continuously and digitally stored. Description: The posterior dominant rhythm consists of  alpha  Hz activity of moderate voltage (25-35 uV) seen predominantly in posterior head regions, symmetric and reactive to eye opening and eye closing. EEG showed intermittent generalized and maximal left frontotemporal region 3 to 6 Hz theta-delta slowing. Physiologic photic driving was not seen during photic stimulation.  Hyperventilation was not performed.   ABNORMALITY - Intermittent slow, generalized and maximal left frontotemporal region IMPRESSION: This study is suggestive of nonspecific cortical dysfunction in left frontotemporal region as well as mild diffuse encephalopathy, nonspecific to etiology.  No seizures or definite epileptiform discharges were seen throughout the recording. Charlsie Quest   ECHOCARDIOGRAM COMPLETE  Result Date: 10/18/2020    ECHOCARDIOGRAM REPORT   Patient Name:   AICHA CLINGENPEEL Date of Exam: 10/18/2020 Medical Rec #:  [id number]      Height:       60.0 in Accession #:    [id number]     Weight:       92.4 lb Date of Birth:  1928/04/22      BSA:          1.344 m  Patient Age:    89 years       BP:           133/71 mmHg Patient Gender: F              HR:           75 bpm. Exam Location:  Inpatient Procedure: 2D Echo, Cardiac Doppler and Color Doppler Indications:    Stroke  History:        Patient has prior history of Echocardiogram examinations, most                 recent 04/05/2017. Risk Factors:Dyslipidemia and Hypertension.                 Prediabetes. Hx TIA. Diastolic CHF.  Sonographer:    Ross Ludwig RDCS (AE) Referring Phys: [id number] VASUNDHRA RATHORE IMPRESSIONS  1. Left ventricular ejection fraction, by estimation, is 60 to 65%. The left ventricle has normal function. The left ventricle has no regional wall motion abnormalities. There is moderate asymmetric left ventricular hypertrophy of the basal-septal segment. Left ventricular diastolic parameters are consistent with Grade I diastolic dysfunction (impaired relaxation).  2. Right ventricular systolic function is normal. The right ventricular size is normal. Tricuspid regurgitation signal is inadequate for assessing PA pressure.  3. The mitral valve is degenerative. Mild mitral valve regurgitation. No evidence of mitral stenosis.  4. The aortic valve is tricuspid. Aortic valve regurgitation is mild. No aortic stenosis is present.  5. The inferior vena cava is normal in size with greater than 50% respiratory variability, suggesting right atrial pressure of 3 mmHg. Conclusion(s)/Recommendation(s): No intracardiac source of embolism detected on this transthoracic study. A transesophageal echocardiogram is recommended to exclude cardiac source of embolism if clinically indicated. FINDINGS  Left Ventricle: Left ventricular ejection fraction, by estimation, is 60 to 65%. The left ventricle has normal function. The left ventricle has no regional wall motion abnormalities. The left ventricular internal cavity size was normal in size. There is  moderate asymmetric left ventricular hypertrophy of the basal-septal segment.  Left ventricular diastolic parameters are consistent with Grade I diastolic dysfunction (impaired relaxation). Right Ventricle: The right ventricular size is normal. No increase in right ventricular wall thickness. Right ventricular systolic function is normal. Tricuspid regurgitation signal is inadequate for assessing PA pressure. Left Atrium: Left atrial size was normal in size. Right Atrium: Right atrial size was normal in size. Pericardium: There is no evidence of pericardial effusion. Mitral Valve: The mitral valve is degenerative in appearance. Mild mitral annular calcification. Mild mitral valve regurgitation. No evidence of mitral valve stenosis. MV peak gradient, 2.7 mmHg. The mean mitral valve gradient is 1.0 mmHg. Tricuspid Valve: The tricuspid valve is grossly normal. Tricuspid valve regurgitation is trivial. No evidence of tricuspid stenosis. Aortic Valve: The aortic valve is tricuspid. Aortic valve regurgitation is mild. Aortic regurgitation PHT measures 309 msec. No aortic stenosis is present. Aortic valve mean gradient measures 2.0 mmHg. Aortic valve peak gradient measures 3.6 mmHg. Aortic  valve area, by VTI measures 1.84 cm. Pulmonic Valve: The pulmonic valve was grossly normal. Pulmonic valve regurgitation is not visualized. No evidence of pulmonic stenosis. Aorta: The aortic root and ascending aorta are structurally normal, with no evidence of dilitation. Venous: The inferior vena cava is normal in size with greater than 50% respiratory variability, suggesting right atrial pressure of 3 mmHg. IAS/Shunts: The atrial septum is grossly normal.  LEFT VENTRICLE PLAX 2D LVIDd:         2.30 cm  Diastology LVIDs:         1.50 cm  LV e' medial:    5.55 cm/s LV PW:         1.20 cm  LV E/e' medial:  9.4 LV IVS:        2.10 cm  LV e' lateral:   7.62 cm/s LVOT diam:     1.90 cm  LV E/e' lateral: 6.9 LV SV:         39 LV SV Index:   29 LVOT Area:     2.84 cm  RIGHT VENTRICLE             IVC RV Basal diam:   2.50 cm     IVC diam: 1.40 cm RV S prime:     11.70 cm/s TAPSE (M-mode): 2.2 cm LEFT ATRIUM           Index       RIGHT ATRIUM           Index LA diam:      2.60 cm 1.93 cm/m  RA Area:     10.70 cm LA Vol (A2C): 15.6 ml 11.60 ml/m RA Volume:   19.30 ml  14.36 ml/m LA Vol (A4C): 39.6 ml 29.46 ml/m  AORTIC VALVE AV Area (Vmax):    2.38 cm AV Area (Vmean):   2.58 cm AV Area (  VTI):     1.84 cm AV Vmax:           94.80 cm/s AV Vmean:          64.100 cm/s AV VTI:            0.214 m AV Peak Grad:      3.6 mmHg AV Mean Grad:      2.0 mmHg LVOT Vmax:         79.60 cm/s LVOT Vmean:        58.400 cm/s LVOT VTI:          0.139 m LVOT/AV VTI ratio: 0.65 AI PHT:            309 msec  AORTA Ao Root diam: 3.50 cm Ao Asc diam:  4.00 cm MITRAL VALVE MV Area (PHT): 2.76 cm    SHUNTS MV Area VTI:   1.64 cm    Systemic VTI:  0.14 m MV Peak grad:  2.7 mmHg    Systemic Diam: 1.90 cm MV Mean grad:  1.0 mmHg MV Vmax:       0.82 m/s MV Vmean:      47.6 cm/s MV Decel Time: 275 msec MV E velocity: 52.30 cm/s MV A velocity: 74.60 cm/s MV E/A ratio:  0.70 Lennie Odor MD Electronically signed by Lennie Odor MD Signature Date/Time: 10/18/2020/4:23:23 PM    Final    CT HEAD CODE STROKE WO CONTRAST  Result Date: 10/17/2020 CLINICAL DATA:  Code stroke.  Speech disturbance. EXAM: CT HEAD WITHOUT CONTRAST TECHNIQUE: Contiguous axial images were obtained from the base of the skull through the vertex without intravenous contrast. COMPARISON:  Head CT 04/03/2017 and MRI 04/04/2017 FINDINGS: Brain: There is no evidence of an acute infarct, intracranial hemorrhage, mass, midline shift, or extra-axial fluid collection. Patchy and confluent hypodensities in the cerebral white matter bilaterally have mildly progressed and are nonspecific but compatible with extensive chronic small vessel ischemic disease. Small chronic cortical infarcts are again seen in the frontal and parietal regions bilaterally. Chronic lacunar infarcts are again noted in  the deep gray nuclei/deep white matter tracks. There is moderate cerebral atrophy. Vascular: Calcified atherosclerosis at the skull base. No hyperdense vessel. Skull: No fracture or suspicious osseous lesion. Sinuses/Orbits: Paranasal sinuses and mastoid air cells are clear. Bilateral cataract extraction. Other: None. ASPECTS [facility] Stroke Program Early CT Score) Not scored given the extensive chronic ischemia and current clinical history. IMPRESSION: 1. No evidence of acute intracranial abnormality. 2. Extensive chronic small vessel ischemic disease with multiple chronic infarcts as above. These results were communicated to Dr. Derry Lory at 7:05 pm on 10/17/2020 by text page via the [facility] messaging system. Electronically Signed   By: Sebastian Ache M.D.   On: 10/17/2020 19:05   VAS US CAROTID  Result Date: 10/18/2020 Carotid Arterial Duplex Study Patient Name:  CORALYNN GAONA  Date of Exam:   10/18/2020 Medical Rec #: [id number]       Accession #:    [id number] Date of Birth: 09-05-1927       Patient Gender: F Patient Age:   89Y Exam Location:  [facility] Procedure:      VAS US CAROTID Referring Phys: [id number] VASUNDHRA RATHORE --------------------------------------------------------------------------------  Indications:       CVA. Risk Factors:      Hypertension, hyperlipidemia. Comparison Study:  04/05/2017 carotid artery duplex- no significant ICA stenosis  bilaterally. Performing Technologist: Gertie FeyMichelle Simonetti MHA, RDMS, RVT, RDCS  Examination Guidelines: A complete evaluation includes B-mode imaging, spectral Doppler, color Doppler, and power Doppler as needed of all accessible portions of each vessel. Bilateral testing is considered an integral part of a complete examination. Limited examinations for reoccurring indications may be performed as noted.  Right Carotid Findings: +----------+--------+--------+--------+------------------+--------+           PSV cm/sEDV  cm/sStenosisPlaque DescriptionComments +----------+--------+--------+--------+------------------+--------+ CCA Prox  166                                                +----------+--------+--------+--------+------------------+--------+ CCA Distal107     8                                          +----------+--------+--------+--------+------------------+--------+ ICA Prox  37      5                                          +----------+--------+--------+--------+------------------+--------+ ICA Distal60      8                                          +----------+--------+--------+--------+------------------+--------+ ECA       64                                                 +----------+--------+--------+--------+------------------+--------+ +----------+--------+-------+----------------+-------------------+           PSV cm/sEDV cmsDescribe        Arm Pressure (mmHG) +----------+--------+-------+----------------+-------------------+ [id number]             Multiphasic, WNL                    +----------+--------+-------+----------------+-------------------+ +---------+--------+--+--------+-+---------+ VertebralPSV cm/s36EDV cm/s8Antegrade +---------+--------+--+--------+-+---------+  Left Carotid Findings: +----------+--------+--------+--------+------------------+------------------+           PSV cm/sEDV cm/sStenosisPlaque DescriptionComments           +----------+--------+--------+--------+------------------+------------------+ CCA Prox  99                                        intimal thickening +----------+--------+--------+--------+------------------+------------------+ CCA Distal87      8                                 intimal thickening +----------+--------+--------+--------+------------------+------------------+ ICA Prox  74                                                            +----------+--------+--------+--------+------------------+------------------+ ICA Distal75      10                                                   +----------+--------+--------+--------+------------------+------------------+  ECA       90                                                           +----------+--------+--------+--------+------------------+------------------+ +----------+--------+--------+----------------+-------------------+           PSV cm/sEDV cm/sDescribe        Arm Pressure (mmHG) +----------+--------+--------+----------------+-------------------+ [id number]              Multiphasic, WNL                    +----------+--------+--------+----------------+-------------------+ +---------+--------+--+--------+-+---------+ VertebralPSV cm/s56EDV cm/s9Antegrade +---------+--------+--+--------+-+---------+   Summary: Right Carotid: The extracranial vessels were near-normal with only minimal wall                thickening or plaque. Left Carotid: The extracranial vessels were near-normal with only minimal wall               thickening or plaque. Vertebrals:  Bilateral vertebral arteries demonstrate antegrade flow. Subclavians: Normal flow hemodynamics were seen in bilateral subclavian              arteries. *See table(s) above for measurements and observations.     Preliminary     Microbiology: Recent Results (from the past 240 hour(s))  SARS CORONAVIRUS 2 (TAT 6-24 HRS) Nasopharyngeal Nasopharyngeal Swab     Status: None   Collection Time: 10/17/20  9:10 PM   Specimen: Nasopharyngeal Swab  Result Value Ref Range Status   SARS Coronavirus 2 NEGATIVE NEGATIVE Final    Comment: (NOTE) SARS-CoV-2 target nucleic acids are NOT DETECTED.  The SARS-CoV-2 RNA is generally detectable in upper and lower respiratory specimens during the acute phase of infection. Negative results do not preclude SARS-CoV-2 infection, do not rule out co-infections with other  pathogens, and should not be used as the sole basis for treatment or other patient management decisions. Negative results must be combined with clinical observations, patient history, and epidemiological information. The expected result is Negative.  Fact Sheet for Patients: HairSlick.no  Fact Sheet for Healthcare Providers: quierodirigir.com  This test is not yet approved or cleared by the [place] FDA and  has been authorized for detection and/or diagnosis of SARS-CoV-2 by FDA under an Emergency Use Authorization (EUA). This EUA will remain  in effect (meaning this test can be used) for the duration of the COVID-19 declaration under Se ction 564(b)(1) of the Act, 21 U.S.C. section 360bbb-3(b)(1), unless the authorization is terminated or revoked sooner.  Performed at [facility], [address] [place], [place] [address]      Labs: Basic Metabolic Panel: Recent Labs  Lab 10/17/20 1845 10/17/20 1857  NA 135 138  K 4.5 4.6  CL 100 99  CO2 27  --   GLUCOSE 103* 103*  BUN 23 26*  CREATININE 0.62 0.60  CALCIUM 9.1  --    Liver Function Tests: Recent Labs  Lab 10/17/20 1845  AST 29  ALT 34  ALKPHOS 66  BILITOT 0.7  PROT 6.9  ALBUMIN 3.7   No results for input(s): LIPASE, AMYLASE in the last 168 hours. No results for input(s): AMMONIA in the last 168 hours. CBC: Recent Labs  Lab 10/17/20 1845 10/17/20 1857  WBC 8.7  --   NEUTROABS 5.3  --   HGB 13.0  13.3  HCT 39.1 39.0  MCV 90.3  --   PLT 228  --    Cardiac Enzymes: No results for input(s): CKTOTAL, CKMB, CKMBINDEX, TROPONINI in the last 168 hours. BNP: BNP (last 3 results) No results for input(s): BNP in the last 8760 hours.  ProBNP (last 3 results) No results for input(s): PROBNP in the last 8760 hours.  CBG: Recent Labs  Lab 10/17/20 1846  GLUCAP 101*       Signed:  Edsel Petrin  [facility] 10/18/2020, 4:26  PM

## 2020-10-19 LAB — HEMOGLOBIN A1C
Hgb A1c MFr Bld: 5.5 % (ref 4.8–5.6)
Mean Plasma Glucose: 111 mg/dL

## 2020-12-09 ENCOUNTER — Inpatient Hospital Stay: Admitting: Adult Health

## 2020-12-09 ENCOUNTER — Encounter: Payer: Self-pay | Admitting: Adult Health

## 2020-12-09 NOTE — Progress Notes (Deleted)
[facility] [address] [place]. [place] [address] [phone]       HOSPITAL FOLLOW UP NOTE  [name] Date of Birth:  11/04/1927 Medical Record Number:  [id number]   Reason for Referral:  hospital stroke follow up    SUBJECTIVE:   CHIEF COMPLAINT:  No chief complaint on file.   HPI:   [name] is a 87 y.o. female with history of prior stroke with residual right hemiplegia, essential tremor, hypertension, hyperlipidemia, CHF and prediabetes who presented on 10/17/2020 with transient episode of expressive aphasia likely left hemispheric TIA vs simple partial seizures given history of stroke.  Personally reviewed hospitalization pertinent progress notes, lab work and imaging with summary provided.  MRI negative for acute abnormality.  CTA head/neck negative LVO or high-grade stenosis.  Carotid Doppler unremarkable.  EF 60 to 65% without evidence of cardiac source of embolism. EEG neg for seizure activity.  On Plavix PTA and recommended aspirin and Brilinta for 4 weeks and aspirin alone.  HTN stable.  LDL 59 on atorvastatin 40 mg daily.  A1c 5.5.  Other stroke risk factors include advanced age, current EtOH use, prior stroke history and diastolic heart failure.  PT/OT no therapy needs.         ROS:   14 system review of systems performed and negative with exception of ***  PMH:  Past Medical History:  Diagnosis Date   Acute blood loss anemia    Adjustment disorder with depressed mood    CVA (cerebral vascular accident) (HCC) 02/03/2016   Linear infarct within the posterior limb of left internal capsule   Diastolic CHF (HCC)    Dysphagia    Dysphonia 02/20/2013   Essential and other specified forms of tremor 02/20/2013   HLD (hyperlipidemia)    HTN (hypertension)    OAB (overactive bladder)    Osteoporosis    Patient receiving subcutaneous heparin    For DVT prophylaxis 9/17   Prediabetes    Psoriasis    Slow transit constipation     Stroke (cerebrum) (HCC) 02/01/2016   Trigeminal neuralgia 02/20/2013    PSH:  Past Surgical History:  Procedure Laterality Date   APPENDECTOMY  1940   bladder tack     CATARACT EXTRACTION Bilateral    HEMORRHOID SURGERY  1960   HUMERUS FRACTURE SURGERY     LAPAROSCOPIC HYSTERECTOMY  2006   torn rotator cuff     WRIST FRACTURE SURGERY  2005   seconday shoulder 2001    Social History:  Social History   Socioeconomic History   Marital status: Married    Spouse name: Kelly [name]   Number of children: 3   Years of education: college   Highest education level: Not on file  Occupational History   Occupation: real estate    Comment: retired  Tobacco Use   Smoking status: Never   Smokeless tobacco: Never  Vaping Use   Vaping Use: Never used  Substance and Sexual Activity   Alcohol use: Yes    Comment: Wine 2 glass daily occasionally   Drug use: No   Sexual activity: Not Currently    Partners: Male    Birth control/protection: Post-menopausal  Other Topics Concern   Not on file  Social History Narrative   Lives at home with her husband.   Right-handed.   No caffeine use.   Social Determinants of Health   Financial Resource Strain: Not on file  Food Insecurity: Not on file  Transportation  Needs: Not on file  Physical Activity: Not on file  Stress: Not on file  Social Connections: Not on file  Intimate Partner Violence: Not on file    Family History:  Family History  Problem Relation Age of Onset   Arthritis Brother        spine   Osteoporosis Maternal Grandmother     Medications:   Current Outpatient Medications on File Prior to Visit  Medication Sig Dispense Refill   acetaminophen (TYLENOL) 500 MG tablet Take 500 mg by mouth every 6 (six) hours as needed for moderate pain.     aspirin 81 MG chewable tablet Chew 1 tablet (81 mg total) by mouth daily. 30 tablet 2   atorvastatin (LIPITOR) 40 MG tablet Take 1 tablet (40 mg total) daily at 6 PM by mouth. 30  tablet 0   diphenhydrAMINE (BENADRYL) 25 mg capsule Take 25 mg by mouth every 6 (six) hours as needed for sleep.     oxybutynin (DITROPAN-XL) 10 MG 24 hr tablet Take 10 mg by mouth daily.     Propylene Glycol (SYSTANE COMPLETE OP) Place 1 drop into both eyes at bedtime.     ticagrelor (BRILINTA) 90 MG TABS tablet Take 1 tablet (90 mg total) by mouth 2 (two) times daily. 60 tablet 0   No current facility-administered medications on file prior to visit.    Allergies:  No Known Allergies    OBJECTIVE:  Physical Exam  There were no vitals filed for this visit. There is no height or weight on file to calculate BMI. No results found.  Depression screen [facility] 2/9 07/01/2018  Decreased Interest 0  Down, Depressed, Hopeless 0  PHQ - 2 Score 0  Altered sleeping -  Tired, decreased energy -  Change in appetite -  Feeling bad or failure about yourself  -  Trouble concentrating -  Moving slowly or fidgety/restless -  Suicidal thoughts -  PHQ-9 Score -  Some recent data might be hidden     General: well developed, well nourished, seated, in no evident distress Head: head normocephalic and atraumatic.   Neck: supple with no carotid or supraclavicular bruits Cardiovascular: regular rate and rhythm, no murmurs Musculoskeletal: no deformity Skin:  no rash/petichiae Vascular:  Normal pulses all extremities   Neurologic Exam Mental Status: Awake and fully alert. Oriented to place and time. Recent and remote memory intact. Attention span, concentration and fund of knowledge appropriate. Mood and affect appropriate.  Cranial Nerves: Fundoscopic exam reveals sharp disc margins. Pupils equal, briskly reactive to light. Extraocular movements full without nystagmus. Visual fields full to confrontation. Hearing intact. Facial sensation intact. Face, tongue, palate moves normally and symmetrically.  Motor: Normal bulk and tone. Normal strength in all tested extremity muscles Sensory.: intact to touch  , pinprick , position and vibratory sensation.  Coordination: Rapid alternating movements normal in all extremities. Finger-to-nose and heel-to-shin performed accurately bilaterally. Gait and Station: Arises from chair without difficulty. Stance is normal. Gait demonstrates normal stride length and balance with ***. Tandem walk and heel toe ***.  Reflexes: 1+ and symmetric. Toes downgoing.     NIHSS  *** Modified Rankin  ***      ASSESSMENT: Kelly [name] is a 87 y.o. year old female with transient episode of aphasia on 10/17/2020 possible TIA vs simple partial seizure given history of stroke on that side. Vascular risk factors include history of stroke, HTN, HLD, EtOH use, diastolic heart failure and advanced age.  PLAN:  Transient aphasia:  TIA: Continue {anticoagulants:31417}  and ***  for secondary stroke prevention.  Discussed secondary stroke prevention measures and importance of close PCP follow up for aggressive stroke risk factor management  Simple partial seizure: EEG 10/18/2020 nonspecific cortical dysfunction and left frontal temporal region and mild diffuse encephalopathy, nonspecific to etiology.  No seizures identified.  HTN: BP goal <130/90.  Stable on *** per PCP HLD: LDL goal <70. Recent LDL 59 on atorvastatin 40 mg daily.  DMII: A1c goal<7.0. Recent A1c ***.     Follow up in *** or call earlier if needed   CC:  [facility] provider: Dr. Pearlean Brownie PCP: Tisovec, Adelfa Koh, MD    I spent *** minutes of face-to-face and non-face-to-face time with patient.  This included previsit chart review, lab review, study review, order entry, electronic health record documentation, patient education regarding recent stroke, residual deficits, importance of managing stroke risk factors and answered all questions to patient satisfaction     Ihor Austin, [facility]  [facility] [address] [place], [place] [address]  Phone [phone] Fax  [phone] Note: This document was prepared with digital dictation and possible smart phrase technology. Any transcriptional errors that result from this process are unintentional.

## 2021-01-20 ENCOUNTER — Ambulatory Visit (INDEPENDENT_AMBULATORY_CARE_PROVIDER_SITE_OTHER): Payer: Medicare Other | Admitting: Adult Health

## 2021-01-20 ENCOUNTER — Other Ambulatory Visit: Payer: Self-pay

## 2021-01-20 ENCOUNTER — Encounter: Payer: Self-pay | Admitting: Adult Health

## 2021-01-20 VITALS — BP 125/70 | HR 81

## 2021-01-20 DIAGNOSIS — G459 Transient cerebral ischemic attack, unspecified: Secondary | ICD-10-CM

## 2021-01-20 DIAGNOSIS — Z8673 Personal history of transient ischemic attack (TIA), and cerebral infarction without residual deficits: Secondary | ICD-10-CM | POA: Diagnosis not present

## 2021-01-20 NOTE — Progress Notes (Signed)
[facility] [address] [place]. [place] [address] [phone]       HOSPITAL FOLLOW UP NOTE  [name] Date of Birth:  Feb 05, 1928 Medical Record Number:  [id number]   Reason for Referral:  hospital stroke follow up    SUBJECTIVE:   CHIEF COMPLAINT:  Chief Complaint  Patient presents with   Follow-up    Patient's husband died on 12/16/22. Patient reports that she is doing ok.     HPI:   [name] is a 87 y.o. female with history of prior stroke with residual right hemiplegia, essential tremor, hypertension, hyperlipidemia, CHF and prediabetes who presented on 10/17/2020 with transient episode of expressive aphasia. Personally reviewed hospitalization pertinent progress notes, lab work and imaging. Evaluated by Dr. Pearlean Brownie for likely left hemispheric TIA vs seizure partial seizure. MRI no acute abnormatlity. MRA head unremarkable. CUS unremarkable. EF 60-65%. LDL 56. A1c 5.5. On plavix PTA - recommended aspirin and brilinta for 4 weeks then aspirin 81mg  daily alone. Htn stable. Continued use of atorvastatin 40mg  daily. Other stroke risk factors include advanced age, current etoh use, hx of stroke/TIA and diastolic heart failure.  PT/OT eval without therapy needs.  Today, 01/20/2021, Kelly [name] is being seen for hospital follow-up accompanied by her daughter and son in law.  Overall stable.  No reoccurring or new stroke/TIA symptoms. Chronic right hemiparesis at baseline.  Nonambulatory and transfers via wheelchair.  Her husband unfortunately passed away on December 16, 2022. She continues to live in her own home with daytime aide assistance.  Daughter lives next door.  Occasional difficulty sleeping at night with confusion and sundowning which is not new. Current use of Benadryl 25 mg nightly to help with sleep - daughter asks if this could be increased.  Remains on aspirin and Brilinta as well as atorvastatin without side effects. Blood pressure today 125/70.  No  further concerns at this time.    Pertinent imaging  CT HEAD 10/17/2020 IMPRESSION: 1. No evidence of acute intracranial abnormality. 2. Extensive chronic small vessel ischemic disease with multiple chronic infarcts as above.   MR BRAIN 10/17/2020 MR ANGIO HEAD WO CONTRAST 10/17/2020 IMPRESSION: 1. No acute intracranial abnormality. 2. Severe chronic small vessel ischemic disease with multiple chronic infarcts as above. 3. Negative head MRA.   CAROTID DUPLEX 10/18/2020 Summary:  Right Carotid: The extracranial vessels were near-normal with only minimal wall thickening or plaque.   Left Carotid: The extracranial vessels were near-normal with only minimal wall thickening or plaque.   Vertebrals:  Bilateral vertebral arteries demonstrate antegrade flow.  Subclavians: Normal flow hemodynamics were seen in bilateral subclavian arteries.    2D echo 10/19/2018 IMPRESSIONS   1. Left ventricular ejection fraction, by estimation, is 60 to 65%. The  left ventricle has normal function. The left ventricle has no regional  wall motion abnormalities. There is moderate asymmetric left ventricular  hypertrophy of the basal-septal  segment. Left ventricular diastolic parameters are consistent with Grade I  diastolic dysfunction (impaired relaxation).   2. Right ventricular systolic function is normal. The right ventricular  size is normal. Tricuspid regurgitation signal is inadequate for assessing  PA pressure.   3. The mitral valve is degenerative. Mild mitral valve regurgitation. No  evidence of mitral stenosis.   4. The aortic valve is tricuspid. Aortic valve regurgitation is mild. No  aortic stenosis is present.   5. The inferior vena cava is normal in size with greater than 50%  respiratory variability, suggesting right atrial  pressure of 3 mmHg.      ROS:   14 system review of systems performed and negative with exception of no complaints  PMH:  Past Medical History:   Diagnosis Date   Acute blood loss anemia    Adjustment disorder with depressed mood    CVA (cerebral vascular accident) (HCC) 02/03/2016   Linear infarct within the posterior limb of left internal capsule   Diastolic CHF (HCC)    Dysphagia    Dysphonia 02/20/2013   Essential and other specified forms of tremor 02/20/2013   HLD (hyperlipidemia)    HTN (hypertension)    OAB (overactive bladder)    Osteoporosis    Patient receiving subcutaneous heparin    For DVT prophylaxis 9/17   Prediabetes    Psoriasis    Slow transit constipation    Stroke (cerebrum) (HCC) 02/01/2016   Trigeminal neuralgia 02/20/2013    PSH:  Past Surgical History:  Procedure Laterality Date   APPENDECTOMY  1940   bladder tack     CATARACT EXTRACTION Bilateral    HEMORRHOID SURGERY  1960   HUMERUS FRACTURE SURGERY     LAPAROSCOPIC HYSTERECTOMY  2006   torn rotator cuff     WRIST FRACTURE SURGERY  2005   seconday shoulder 2001    Social History:  Social History   Socioeconomic History   Marital status: Married    Spouse name: [name]   Number of children: 3   Years of education: college   Highest education level: Not on file  Occupational History   Occupation: real estate    Comment: retired  Tobacco Use   Smoking status: Never   Smokeless tobacco: Never  Vaping Use   Vaping Use: Never used  Substance and Sexual Activity   Alcohol use: Yes    Comment: Wine 2 glass daily occasionally   Drug use: No   Sexual activity: Not Currently    Partners: Male    Birth control/protection: Post-menopausal  Other Topics Concern   Not on file  Social History Narrative   Lives at home with her husband.   Right-handed.   No caffeine use.   Social Determinants of Health   Financial Resource Strain: Not on file  Food Insecurity: Not on file  Transportation Needs: Not on file  Physical Activity: Not on file  Stress: Not on file  Social Connections: Not on file  Intimate Partner Violence: Not on  file    Family History:  Family History  Problem Relation Age of Onset   Arthritis Brother        spine   Osteoporosis Maternal Grandmother     Medications:   Current Outpatient Medications on File Prior to Visit  Medication Sig Dispense Refill   acetaminophen (TYLENOL) 500 MG tablet Take 500 mg by mouth every 6 (six) hours as needed for moderate pain.     aspirin 81 MG chewable tablet Chew 1 tablet (81 mg total) by mouth daily. 30 tablet 2   atorvastatin (LIPITOR) 40 MG tablet Take 1 tablet (40 mg total) daily at 6 PM by mouth. 30 tablet 0   diphenhydrAMINE (BENADRYL) 25 mg capsule Take 25 mg by mouth every 6 (six) hours as needed for sleep.     Probiotic Product (PROBIOTIC DAILY PO) Take by mouth daily.     Propylene Glycol (SYSTANE COMPLETE OP) Place 1 drop into both eyes at bedtime.     ticagrelor (BRILINTA) 90 MG TABS tablet Take 1 tablet (90 mg total) by  mouth 2 (two) times daily. 60 tablet 0   oxybutynin (DITROPAN-XL) 10 MG 24 hr tablet Take 10 mg by mouth daily. (Patient not taking: Reported on 01/20/2021)     No current facility-administered medications on file prior to visit.    Allergies:  No Known Allergies    OBJECTIVE:  Physical Exam  Vitals:   01/20/21 1401  BP: 125/70  Pulse: 81   There is no height or weight on file to calculate BMI. No results found.  General: Frail very pleasant elderly Caucasian female, seated, in no evident distress Head: head normocephalic and atraumatic.   Neck: supple with no carotid or supraclavicular bruits Cardiovascular: regular rate and rhythm, no murmurs Musculoskeletal: no deformity Skin:  no rash/petichiae Vascular:  Normal pulses all extremities   Neurologic Exam Mental Status: Awake and fully alert.  Fluent speech and language.  Oriented to place and time. Recent memory impaired and remote memory intact. Attention span, concentration and fund of knowledge diminished with daughter providing majority of history. Mood  and affect appropriate and cooperative with exam.  Cranial Nerves: Fundoscopic exam reveals sharp disc margins. Pupils equal, briskly reactive to light. Extraocular movements full without nystagmus. Visual fields full to confrontation. Hearing intact. Facial sensation intact. Face, tongue, palate moves normally and symmetrically.  Motor: Chronic right spastic hemiparesis. Mild left hip flexor weakness Sensory.: intact to touch , pinprick , position and vibratory sensation.  Coordination: Rapid alternating movements normal on left side. Finger-to-nose and heel-to-shin performed accurately on left side. Gait and Station: Deferred Reflexes: 1+ and symmetric. Toes downgoing.        ASSESSMENT: Kelly [name] is a 87 y.o. year old female with transient aphasia episode likely left hemispheric TIA vs simple partial seizure on 10/17/2020. Vascular risk factors include HTN, HLD, hx of stroke with residaul right hemiplegia, etoh use and diastolic heart failure.      PLAN:  TIA  Hx of embolic stroke, cryptogenic:  Chronic right spastic hemiparesis - stable Further embolic work-up not completed as likely not AC candidate due to high fall risk and advanced age Continue aspirin 81 mg daily  and atorvastatin 40 mg daily for secondary stroke prevention.  Advised to discontinue Brilinta as 4 weeks duration completed.   Discussed secondary stroke prevention measures and importance of close PCP follow up for aggressive stroke risk factor management. I have gone over the pathophysiology of stroke, warning signs and symptoms, risk factors and their management in some detail with instructions to go to the closest emergency room for symptoms of concern. HTN: BP goal <130/90.  Stable on current regimen per PCP HLD: LDL goal <70. Recent LDL 59 on atorvastatin 40mg  daily.  Cognitive impairment: not new.  Occasional difficulty sleeping at night - would not recommend use of Benadryl due to possible side effects in  elderly.  Discussed potential benefit of melatonin - advised to discuss other options with Dr. at f/u visit 9/7.     Follow up in 4 months or call earlier if needed   CC:  [facility] provider: Dr. 11/7 PCP: Tisovec, Pearlean Brownie, MD    I spent 48 minutes of face-to-face and non-face-to-face time with patient, daughter and son-in-law.  This included previsit chart review including review of recent hospitalization, lab review, study review, electronic health record documentation, patient and family education regarding recent TIA, prior stroke history with residual deficits, secondary stroke prevention measures and importance of managing stroke risk factors and answered all questions to patient and family's  satisfaction  Frann Rider, AGNP-BC  [facility] [address] [place], [place] [address]  Phone [phone] Fax [phone] Note: This document was prepared with digital dictation and possible smart phrase technology. Any transcriptional errors that result from this process are unintentional.

## 2021-01-21 NOTE — Progress Notes (Signed)
I agree with the above plan 

## 2021-04-24 DEATH — deceased

## 2021-05-29 ENCOUNTER — Telehealth: Payer: Self-pay | Admitting: Adult Health

## 2021-05-29 NOTE — Telephone Encounter (Signed)
Card on NP's desk.

## 2021-05-29 NOTE — Telephone Encounter (Signed)
Can we please send condolence card? Thank you!

## 2021-05-29 NOTE — Telephone Encounter (Signed)
Pt daughter called to inform Pt passed on 04/18/21

## 2021-06-03 ENCOUNTER — Ambulatory Visit: Payer: Medicare Other | Admitting: Adult Health
